# Patient Record
Sex: Female | Born: 1945 | Race: White | Hispanic: No | Marital: Married | Smoking: Former smoker
Health system: Southern US, Community
[De-identification: ages and names within clinical notes are randomized; demographics above are authoritative.]

## PROBLEM LIST (undated history)

## (undated) DIAGNOSIS — M810 Age-related osteoporosis without current pathological fracture: Secondary | ICD-10-CM

## (undated) DIAGNOSIS — K317 Polyp of stomach and duodenum: Secondary | ICD-10-CM

## (undated) DIAGNOSIS — I499 Cardiac arrhythmia, unspecified: Secondary | ICD-10-CM

## (undated) DIAGNOSIS — I10 Essential (primary) hypertension: Secondary | ICD-10-CM

## (undated) DIAGNOSIS — G8929 Other chronic pain: Secondary | ICD-10-CM

## (undated) DIAGNOSIS — T7840XA Allergy, unspecified, initial encounter: Secondary | ICD-10-CM

## (undated) DIAGNOSIS — F419 Anxiety disorder, unspecified: Secondary | ICD-10-CM

## (undated) DIAGNOSIS — E039 Hypothyroidism, unspecified: Secondary | ICD-10-CM

## (undated) DIAGNOSIS — H269 Unspecified cataract: Secondary | ICD-10-CM

## (undated) DIAGNOSIS — I700 Atherosclerosis of aorta: Secondary | ICD-10-CM

## (undated) DIAGNOSIS — N189 Chronic kidney disease, unspecified: Secondary | ICD-10-CM

## (undated) DIAGNOSIS — H547 Unspecified visual loss: Secondary | ICD-10-CM

## (undated) DIAGNOSIS — F32A Depression, unspecified: Secondary | ICD-10-CM

## (undated) DIAGNOSIS — Z9289 Personal history of other medical treatment: Secondary | ICD-10-CM

## (undated) DIAGNOSIS — I4819 Other persistent atrial fibrillation: Secondary | ICD-10-CM

## (undated) DIAGNOSIS — K222 Esophageal obstruction: Secondary | ICD-10-CM

## (undated) DIAGNOSIS — G43909 Migraine, unspecified, not intractable, without status migrainosus: Secondary | ICD-10-CM

## (undated) DIAGNOSIS — E7800 Pure hypercholesterolemia, unspecified: Secondary | ICD-10-CM

## (undated) DIAGNOSIS — S42009A Fracture of unspecified part of unspecified clavicle, initial encounter for closed fracture: Secondary | ICD-10-CM

## (undated) DIAGNOSIS — I69398 Other sequelae of cerebral infarction: Principal | ICD-10-CM

## (undated) DIAGNOSIS — K219 Gastro-esophageal reflux disease without esophagitis: Secondary | ICD-10-CM

## (undated) DIAGNOSIS — J449 Chronic obstructive pulmonary disease, unspecified: Secondary | ICD-10-CM

## (undated) DIAGNOSIS — E559 Vitamin D deficiency, unspecified: Secondary | ICD-10-CM

## (undated) DIAGNOSIS — F329 Major depressive disorder, single episode, unspecified: Secondary | ICD-10-CM

## (undated) DIAGNOSIS — Q453 Other congenital malformations of pancreas and pancreatic duct: Secondary | ICD-10-CM

## (undated) DIAGNOSIS — D1803 Hemangioma of intra-abdominal structures: Secondary | ICD-10-CM

## (undated) DIAGNOSIS — N289 Disorder of kidney and ureter, unspecified: Secondary | ICD-10-CM

## (undated) DIAGNOSIS — M1990 Unspecified osteoarthritis, unspecified site: Secondary | ICD-10-CM

## (undated) DIAGNOSIS — K589 Irritable bowel syndrome without diarrhea: Secondary | ICD-10-CM

## (undated) DIAGNOSIS — I639 Cerebral infarction, unspecified: Secondary | ICD-10-CM

## (undated) DIAGNOSIS — F22 Delusional disorders: Secondary | ICD-10-CM

## (undated) DIAGNOSIS — M545 Low back pain, unspecified: Secondary | ICD-10-CM

## (undated) DIAGNOSIS — K449 Diaphragmatic hernia without obstruction or gangrene: Secondary | ICD-10-CM

## (undated) HISTORY — PX: UPPER GASTROINTESTINAL ENDOSCOPY: SHX188

## (undated) HISTORY — DX: Anxiety disorder, unspecified: F41.9

## (undated) HISTORY — DX: Irritable bowel syndrome, unspecified: K58.9

## (undated) HISTORY — DX: Cerebral infarction, unspecified: I63.9

## (undated) HISTORY — PX: FOOT FRACTURE SURGERY: SHX645

## (undated) HISTORY — DX: Unspecified visual loss: H54.7

## (undated) HISTORY — DX: Polyp of stomach and duodenum: K31.7

## (undated) HISTORY — DX: Esophageal obstruction: K22.2

## (undated) HISTORY — DX: Pure hypercholesterolemia, unspecified: E78.00

## (undated) HISTORY — DX: Essential (primary) hypertension: I10

## (undated) HISTORY — DX: Other congenital malformations of pancreas and pancreatic duct: Q45.3

## (undated) HISTORY — DX: Unspecified cataract: H26.9

## (undated) HISTORY — DX: Gastro-esophageal reflux disease without esophagitis: K21.9

## (undated) HISTORY — DX: Diaphragmatic hernia without obstruction or gangrene: K44.9

## (undated) HISTORY — DX: Unspecified osteoarthritis, unspecified site: M19.90

## (undated) HISTORY — DX: Fracture of unspecified part of unspecified clavicle, initial encounter for closed fracture: S42.009A

## (undated) HISTORY — PX: ANKLE FRACTURE SURGERY: SHX122

## (undated) HISTORY — PX: EYE SURGERY: SHX253

## (undated) HISTORY — DX: Other sequelae of cerebral infarction: I69.398

## (undated) HISTORY — DX: Age-related osteoporosis without current pathological fracture: M81.0

## (undated) HISTORY — DX: Delusional disorders: F22

## (undated) HISTORY — DX: Atherosclerosis of aorta: I70.0

## (undated) HISTORY — PX: COLONOSCOPY: SHX174

## (undated) HISTORY — PX: KNEE ARTHROSCOPY: SHX127

## (undated) HISTORY — DX: Vitamin D deficiency, unspecified: E55.9

## (undated) HISTORY — DX: Allergy, unspecified, initial encounter: T78.40XA

## (undated) HISTORY — PX: CATARACT EXTRACTION W/ INTRAOCULAR LENS  IMPLANT, BILATERAL: SHX1307

## (undated) HISTORY — DX: Hypothyroidism, unspecified: E03.9

## (undated) HISTORY — DX: Hemangioma of intra-abdominal structures: D18.03

## (undated) HISTORY — PX: BACK SURGERY: SHX140

## (undated) HISTORY — PX: FRACTURE SURGERY: SHX138

## (undated) HISTORY — PX: APPENDECTOMY: SHX54

## (undated) HISTORY — PX: DILATION AND CURETTAGE OF UTERUS: SHX78

---

## 1968-08-21 DIAGNOSIS — Z9289 Personal history of other medical treatment: Secondary | ICD-10-CM

## 1968-08-21 HISTORY — PX: NEPHRECTOMY: SHX65

## 1968-08-21 HISTORY — DX: Personal history of other medical treatment: Z92.89

## 1970-08-21 HISTORY — PX: ABDOMINAL HYSTERECTOMY: SHX81

## 1998-09-06 ENCOUNTER — Encounter: Payer: Self-pay | Admitting: Gastroenterology

## 1998-09-06 ENCOUNTER — Ambulatory Visit (HOSPITAL_COMMUNITY): Admission: RE | Admit: 1998-09-06 | Discharge: 1998-09-06 | Payer: Self-pay | Admitting: Gastroenterology

## 1998-09-23 ENCOUNTER — Encounter: Payer: Self-pay | Admitting: Gastroenterology

## 1998-09-23 ENCOUNTER — Ambulatory Visit (HOSPITAL_COMMUNITY): Admission: RE | Admit: 1998-09-23 | Discharge: 1998-09-23 | Payer: Self-pay | Admitting: Gastroenterology

## 1998-11-22 ENCOUNTER — Ambulatory Visit (HOSPITAL_COMMUNITY): Admission: RE | Admit: 1998-11-22 | Discharge: 1998-11-22 | Payer: Self-pay | Admitting: Gastroenterology

## 1998-12-16 ENCOUNTER — Encounter: Admission: RE | Admit: 1998-12-16 | Discharge: 1998-12-23 | Payer: Self-pay | Admitting: Anesthesiology

## 1998-12-23 ENCOUNTER — Encounter: Admission: RE | Admit: 1998-12-23 | Discharge: 1999-03-23 | Payer: Self-pay | Admitting: Anesthesiology

## 2000-11-19 HISTORY — PX: LUMBAR FUSION: SHX111

## 2000-11-22 ENCOUNTER — Encounter: Payer: Self-pay | Admitting: Neurosurgery

## 2000-11-26 ENCOUNTER — Encounter: Payer: Self-pay | Admitting: Neurosurgery

## 2000-11-26 ENCOUNTER — Ambulatory Visit (HOSPITAL_COMMUNITY): Admission: RE | Admit: 2000-11-26 | Discharge: 2000-11-27 | Payer: Self-pay | Admitting: Neurosurgery

## 2000-12-19 ENCOUNTER — Inpatient Hospital Stay (HOSPITAL_COMMUNITY): Admission: AD | Admit: 2000-12-19 | Discharge: 2000-12-22 | Payer: Self-pay | Admitting: Internal Medicine

## 2001-06-23 ENCOUNTER — Emergency Department (HOSPITAL_COMMUNITY): Admission: EM | Admit: 2001-06-23 | Discharge: 2001-06-23 | Payer: Self-pay | Admitting: Emergency Medicine

## 2001-06-23 ENCOUNTER — Encounter: Payer: Self-pay | Admitting: Emergency Medicine

## 2002-01-17 ENCOUNTER — Ambulatory Visit (HOSPITAL_COMMUNITY): Admission: RE | Admit: 2002-01-17 | Discharge: 2002-01-17 | Payer: Self-pay | Admitting: Preventative Medicine

## 2002-10-28 ENCOUNTER — Encounter: Payer: Self-pay | Admitting: Rheumatology

## 2002-10-28 ENCOUNTER — Encounter: Admission: RE | Admit: 2002-10-28 | Discharge: 2002-10-28 | Payer: Self-pay | Admitting: Rheumatology

## 2002-10-28 ENCOUNTER — Encounter: Admission: RE | Admit: 2002-10-28 | Discharge: 2002-10-28 | Payer: Self-pay | Admitting: Internal Medicine

## 2002-10-28 ENCOUNTER — Encounter: Payer: Self-pay | Admitting: Internal Medicine

## 2004-05-19 ENCOUNTER — Ambulatory Visit (HOSPITAL_COMMUNITY): Admission: RE | Admit: 2004-05-19 | Discharge: 2004-05-19 | Payer: Self-pay | Admitting: Specialist

## 2004-07-06 ENCOUNTER — Inpatient Hospital Stay (HOSPITAL_COMMUNITY): Admission: RE | Admit: 2004-07-06 | Discharge: 2004-07-12 | Payer: Self-pay | Admitting: Orthopaedic Surgery

## 2006-01-19 HISTORY — PX: KNEE ARTHROSCOPY: SHX127

## 2006-01-29 ENCOUNTER — Ambulatory Visit (HOSPITAL_BASED_OUTPATIENT_CLINIC_OR_DEPARTMENT_OTHER): Admission: RE | Admit: 2006-01-29 | Discharge: 2006-01-29 | Payer: Self-pay | Admitting: Specialist

## 2010-09-10 ENCOUNTER — Ambulatory Visit (HOSPITAL_COMMUNITY)
Admission: RE | Admit: 2010-09-10 | Discharge: 2010-09-10 | Payer: Self-pay | Source: Home / Self Care | Attending: Orthopedic Surgery | Admitting: Orthopedic Surgery

## 2010-09-11 ENCOUNTER — Encounter: Payer: Self-pay | Admitting: Internal Medicine

## 2010-11-15 ENCOUNTER — Other Ambulatory Visit: Payer: Self-pay | Admitting: Gastroenterology

## 2010-11-15 DIAGNOSIS — R112 Nausea with vomiting, unspecified: Secondary | ICD-10-CM

## 2010-11-16 ENCOUNTER — Ambulatory Visit
Admission: RE | Admit: 2010-11-16 | Discharge: 2010-11-16 | Disposition: A | Discharge status: Home / Self Care | Payer: Medicare Other | Source: Ambulatory Visit | Attending: Gastroenterology | Admitting: Gastroenterology

## 2010-11-16 DIAGNOSIS — R112 Nausea with vomiting, unspecified: Secondary | ICD-10-CM

## 2010-11-24 ENCOUNTER — Other Ambulatory Visit (HOSPITAL_COMMUNITY): Payer: Self-pay | Admitting: Gastroenterology

## 2010-11-24 DIAGNOSIS — R112 Nausea with vomiting, unspecified: Secondary | ICD-10-CM

## 2010-11-30 ENCOUNTER — Encounter (HOSPITAL_COMMUNITY)
Admission: RE | Admit: 2010-11-30 | Discharge: 2010-11-30 | Disposition: A | Discharge status: Home / Self Care | Payer: Medicare Other | Source: Ambulatory Visit | Attending: Gastroenterology | Admitting: Gastroenterology

## 2010-11-30 DIAGNOSIS — R112 Nausea with vomiting, unspecified: Secondary | ICD-10-CM | POA: Insufficient documentation

## 2010-11-30 DIAGNOSIS — R109 Unspecified abdominal pain: Secondary | ICD-10-CM | POA: Insufficient documentation

## 2010-11-30 MED ORDER — TECHNETIUM TC 99M MEBROFENIN IV KIT
5.0000 | PACK | Freq: Once | INTRAVENOUS | Status: AC | PRN
  Administered 2010-11-30: 5 via INTRAVENOUS

## 2010-12-01 ENCOUNTER — Other Ambulatory Visit: Payer: Self-pay | Admitting: Internal Medicine

## 2010-12-01 DIAGNOSIS — Z1231 Encounter for screening mammogram for malignant neoplasm of breast: Secondary | ICD-10-CM

## 2010-12-08 ENCOUNTER — Ambulatory Visit: Payer: Medicare Other

## 2010-12-14 ENCOUNTER — Ambulatory Visit: Payer: Medicare Other

## 2010-12-28 ENCOUNTER — Ambulatory Visit
Admission: RE | Admit: 2010-12-28 | Discharge: 2010-12-28 | Disposition: A | Discharge status: Home / Self Care | Payer: Medicare Other | Source: Ambulatory Visit | Attending: Internal Medicine | Admitting: Internal Medicine

## 2010-12-28 ENCOUNTER — Other Ambulatory Visit: Payer: Self-pay | Admitting: Internal Medicine

## 2010-12-28 ENCOUNTER — Ambulatory Visit (HOSPITAL_COMMUNITY): Payer: Medicare Other

## 2010-12-28 DIAGNOSIS — Z139 Encounter for screening, unspecified: Secondary | ICD-10-CM

## 2011-01-06 NOTE — Consult Note (Signed)
[place]. [facility]  Patient:    [name], [name]                      MRN: [id number] Proc. Date: 12/20/00 Adm. Date:  [id number] Disc. Date: [id number] Attending:  Chilton Greathouse R CC:         Ravi R. Felipa Eth, M.D.   Consultation Report  HISTORY OF PRESENT ILLNESS:  The patient is a 65 year old, right-handed, white female, who is well known to me and whom I first saw in consultation less than a month and one-half ago.  At that time, she was suffering from a left lumbar radiculopathy that was secondary to a left L3-4 foraminal/extraforaminal disk herniation.  She was taken to surgery three and one-half weeks ago and underwent a left L3-4 extraforaminal microdiskectomy.  The patient was admitted yesterday after a narcotic overdose.  It is unclear exactly all the medications that she took, but it appears that she took Tylenol PM, Darvocet, Flexeril, and then put on a Duragesic patch.  She may also have taken Tylenol No. 3, but that is uncertain.  In any event, the patient was admitted to the hospital and has recovered from the excessive sedation that those medications caused.  Neurosurgical consultation was requested by Dr. Felipa Eth regarding evaluation of her wounds and her recovery from her surgery.  Four days ago, the patient developed, without any particular cause, a swelling beneath her incision.  We saw her in the office two days ago and found the incision to be well healed over the swelling beneath the incision.  There was no erythema or drainage.  The patient, at the time of her initial evaluation in March of this year, had been on narcotic medications for over a year and was being treated at that time with a Duragesic patch.  She had been counselled, as had her family, that subsequently to the surgery she needed to wean herself off the medications, and that we could help her tolerate the narcotic withdrawal phenomenon with a Clonidine patch.  Notably,  she was receiving medications both through our office and through Dr. Lanier Clam office.  The patient does not describe any fever, low back pain, wound drainage, etc.  PAST MEDICAL HISTORY, FAMILY HISTORY, SOCIAL HISTORY, REVIEW OF SYSTEMS:  As described in my admission note from November 26, 2000.  PHYSICAL EXAMINATION:  GENERAL:  The patient is a well-developed, well-nourished white female in no acute distress.  VITAL SIGNS:  Temperature 98.3, pulse 64, blood pressure 130/90, respiratory rate 20.  BACK:  Wound shows some swelling without change from her exam on April 30. There is no erythema or drainage.  NEUROLOGIC:  Mental status:  The patient is awake, alert, oriented to her name, place, and year, and is following commands.  Her speech is fluent.  She has good comprehension.  Motor examination shows 5/5 strength in the lower extremities including the iliopsoas, quadriceps, dorsiflexion, extensor hallucis longus, and plantar flexor.  Reflexes are 1+ in the biceps and triceps bilaterally.  The left quadriceps is 1+, but the right quadriceps is 2+.  Gastrocnemius are 1+ bilaterally.  Toes are downgoing bilaterally.  There is no clonus at the ankles nor are there Hoffmanns.  She had no spasticity of her lower extremities on exam.  IMPRESSION: 1. Patient recovering from lumbar surgery with improving radicular symptoms.    At this point, her symptoms are really quite minimal.  She denies any lower    back pain.  There is some swelling of her wound that does not appear to be    infected and most likely represents a seroma and which may well resolve on    its own spontaneously. 2. Narcotic addiction with unsuccessful attempt at cessation.  This condition    has been complicated by patient receiving prescriptions from multiple    physicians. 3. "Tightness" in her extremities.  The nature of this is uncertain.  I do not    believe it is related to a disk disease or disk surgery because her     symptoms preceded her surgery, are bilateral, and involved both the upper    and lower extremities.  The nature may be related to her narcotic    addiction.  On the other hand, it is possible that it could be related to    some neurologic phenomenon and may warrant neurologic evaluation.  RECOMMENDATIONS: 1. I will continue to monitor the patients wound, but no intervention    regarding the wound is necessary at this time. 2. The patient most likely will require inpatient narcotic detoxification.    Certainly, she needs only one physician prescribing medications to reduce    the risk for abuse; therefore, I will defer all prescribing of medications    to Dr. Felipa Eth. 3. The phenomenon of tightness in her extremities may be related to a narcotic    withdrawal, but it could well require neurology evaluation.  Again, this    will be deferred to Dr. Felipa Eth.  At this time, Dr. Criss Alvine is on call for Dr. Felipa Eth, and I will attempt to contact Dr. Felipa Eth tomorrow. DD:  12/20/00 TD:  12/22/00 Job: [id number]

## 2011-01-06 NOTE — H&P (Signed)
[place]  Patient:    NOA, GALVAO                      MRN: [id number] Adm. Date:  [id number] Attending:  Chilton Greathouse R CC:         Hewitt Shorts, M.D.   History and Physical  CHIEF COMPLAINT: Narcotic overdose, transferred from outside hospital.  HISTORY OF PRESENT ILLNESS: This is a 65 year old Caucasian female, who I have followed in my practice for several years, who has a long history of lower extremity radicular symptoms and was found to have a left lumbar radiculopathy with a left L3-4 foraminal and extraforaminal disk herniation.  She has a history of undergoing extensive nonsurgical treatment including a variety of spinal injections and treatment with nonsteroidal anti-inflammatory agents without relief.  She was evaluated by Dr. Shirlean Kelly, who discussed surgical intervention, and she was in agreement.  This was performed on November 26, 2000.  Prior to this surgical intervention the patient has a long history of using narcotic agents for pain relief.  For approximately one year prior she has been using Duragesic patches at a dose of 75 mcg/h.  This was in conjunction with as-needed Darvocet.  After surgery she was encouraged to discontinue the Duragesic patch.  Shortly after surgery she called our office complaining of lower extremity spasms.  Muscle relaxants were called in by Dr. Newell Coral; however, despite that intervention along with hot baths and showers, her pain continued.  Given her long history of using narcotics it was recommended that she gradually taper down on her Duragesic patch.  She was told to use Duragesic patches of 50 mcg/h, changed every three days for approximately two weeks, and then go to 25 mcg/h for the next two weeks, and then discontinue.  In follow-up visits with Dr. Newell Coral he also again encouraged cessation of the Duragesic patch, per the patients report.  She was restarted on Celebrex  along with a Clonidine patch.  Given the patients determination to come off the Duragesic patch, on December 16, 2000 the patient stopped her Duragesic patch at that time, which consisted of 50 mcg/h of fentanyl, and started the Clonidine patch in conjunction with the Celebrex. Yesterday, December 18, 2000, the patient developed progressive lower extremity spasms and cramps.  Over a period of what everyone perceives to be six to eight hours, the patient started taking progressive pain medications to alleviate her suffering.  She first started off taking Flexeril, dose unknown, then took Tylenol #3 x 2 tablets, Tylenol P.M. x 2 tablets, and then finally placed a 25 mcg/h patch of fentanyl on.  The latter took place at approximately 3 a.m. this morning.  The patient was found this morning at 6 a.m. by a niece.  At that time she was unresponsive and unable to walk.  She may have been mumbling.  There were no obvious injuries.  There is no history of seizure disorder.  She was [place] [facility] [place]  On arrival her Duragesic patch was removed.  Discussion with the family revealed that she had not been overly depressed or talking about wanting to hurt herself.  There was no suicide note.  There is no history of suicide attempt.  There is no marital conflict or other problems noted by family.  The patient did receive Narcan 2 mg IV and within a few minutes was opening her eyes, with pupils changing to  mid point.  She was communicative and recognized her son.  She subsequently did become agitated, however, and was controlled.  After family discussion the patient was transferred to [facility] as a direct admission for further evaluation by myself.  REVIEW OF SYSTEMS: Negative for signs or symptoms of upper respiratory tract infection.  Of note, the patient did see Dr. Newell Coral on December 18, 2000 for progressive swelling around her lumbar  laminectomy wound.  Per family report, it is unclear what caused the swelling; however, he at that time recommended further observation with follow-up visit this coming Monday.  The patient denies any chest pain, shortness of breath, nausea, vomiting, new neurologic deficits, new musculoskeletal deficits with the exception of that referring to her lower extremity radicular pain and cramps.  The patient denies any dysuria, diarrhea, constipation, or blood per vaginal area or rectum.  PAST MEDICAL/SURGICAL HISTORY:  1. Hypertension.  2. Hypothyroidism.  3. History of hormone replacement therapy.  4. History of tobacco abuse.  5. Gastroesophageal reflux disease, with history of peptic ulcer disease.  6. Hyperlipidemia.  7. Seasonal allergic rhinitis.  8. History of depression and anxiety.  9. Status post total abdominal hysterectomy. 10. History of migraine headaches. 11. 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 12. History of degenerative disk disease and degenerative joint disease.  SOCIAL HISTORY: The patient is married for 30+ years and has three children and four grandchildren.  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  Has smoked approximately three-fourths of a pack per day for the last 30 years.  Has occasional alcohol use.  Denies any illegal drug abuse.  FAMILY HISTORY: Father died at the age of 40 of vascular disease.  Mother is 18 and has hypertension and a history of stroke.  The patient has numerous siblings, with family history significant for hypertension.  CURRENT MEDICATIONS (as best I can tell):  1. Prilosec 40 mg q.d.  2. Synthroid 150 mcg q.d.  3. Lescol XL 80 mg q.d.  4. Prozac 20 mg q.d.  5. Hydroxyzine 25 mg q.d. as-needed.  6. Claritin-D 24 Hour as-needed.  7. Altace 5 mg q.d.  8. Premarin q.d.  9. Hydrochlorothiazide 12.5 mg q.d. 10. Celebrex 200 mg q.d. 11. Duragesic patch, prior to surgery 75 mcg/h -  changed every three days. 12. Imitrex nasal spray as-needed. 13. Levsin as-needed.  ALLERGIES: PENICILLIN.   PHYSICAL EXAMINATION:  GENERAL: The patient is a frail Caucasian female, with general malaise, sitting in bed, who is conversant and appropriate.  VITAL SIGNS: The patient is afebrile.  Blood pressure is 140/80, respirations are 18 and unlabored, pulse is 74, with telemetry revealing normal sinus rhythm.  HEENT: Head normocephalic, atraumatic.  EOMI.  Sclerae anicteric.  Pupils reactive bilaterally and equal.  No sinus tenderness.  No oropharyngeal lesions.  NECK: Supple.  No thyromegaly or cervical lymphadenopathy.  LUNGS: Clear to auscultation bilaterally.  CARDIOVASCULAR: Regular rate and rhythm without murmurs, rubs, or gallops appreciated.  No axillary lymphadenopathy.  ABDOMEN: Soft, nontender, nondistended abdomen.  Bowel sounds present throughout.  No hepatosplenomegaly.  GU/RECTAL: Examinations are deferred.  BACK: Nonerythematous tissue swelling noted over recent surgical wound, which is intact, with no drainage.  There is no fluctuance over this soft tissue swelling.  The wound is not warm.  EXTREMITY: No clubbing, cyanosis, or edema.  Pulses intact in all four extremities.  NEUROLOGIC: Cranial nerves 2-12 are grossly intact.  Light touch  is grossly intact throughout.  Muscle strength is grossly intact throughout; however, gait was not tested.  Coordination is somewhat slow but intact with respect to rapid alternating hand movements.  LABORATORY DATA: Laboratory evaluation at [facility] included EKG revealing normal sinus rhythm with no acute changes.  Tox screen is negative.  Glucose 98, BUN 10, creatinine 0.9.  Calcium 9.4, total protein 7.3, albumin 3.8, alkaline phosphatase 62, SGOT 13, total bilirubin 0.5, SGPT 10.  Sodium 138, potassium 3.6, chloride 103, CO2 31. Urinalysis unremarkable.  ABG revealed a pH of 7.4, pCO2 48, pO2  79, bicarbonate 29, oxygen saturation 96% on room air.  Urine pregnancy test negative.  WBC 5.0, hemoglobin 11.8, hematocrit 33.4%; MCV 92; platelet count 322,000.  ASSESSMENT/PLAN: The patient is a 65 year old Caucasian female, who has a long history of narcotic use for her lower extremity radiculopathy secondary to disk herniation.  She has recently undergone surgery with lumbar laminectomy by Dr. Newell Coral.  Her course was uneventful; however, the patient tried to discontinue her Duragesic patch abruptly and suffered from significant lower extremity pain.  It was recommended that she gradually taper her Duragesic patch; however, within the last week; i.e., last Sunday, the patient abruptly stopped her 50 mcg/h Duragesic patch and subsequently over the next two days suffered significant lower extremity pain.  During the last 12 hours prior to admission at an outside ER the patient started using progressive medications to treat her pain including narcotics, muscle relaxers, sleeping agents.  The patient was found unresponsive by a relative, after which she was subsequently transported to the [facility] Emergency Room.  Narcan resulted in significant change in her mental status, with her being awake and alert and responsive to those around her.  Currently she is hemodynamically stable, with laboratory evaluation being unremarkable.  I suspect that her narcotic overdose this morning was secondary to her increased pain and suffering but was not a deliberate attempt to harm herself or those around her.  There is no suicide note.  There is no mention of taking her life or harming herself to any of her family members.  She certainly has no history of suicide attempts; however, she does have a history of anxiety and depression, which has been well controlled in the past on Prozac.  Certainly affective disorders may decrease her pain threshold; however, all are in agreement  including multiple family members, spouse, and children that she is not at risk for trying to harm herself.  Regardless, I have discussed this in detail with the patient and she also reiterates that she has no suicidal ideation and has contracted for safety.  Again, our discussion also revolved around the fact that we probably need to gradually taper her narcotic agents.  Indeed, we will need to be very careful with her use of breakthrough pain medications as well as use of Duragesic patches.  Out plan right now will be to observe her overnight and monitor for any increased pain and/or withdrawal side effects from the narcotic agents.  We will provide her with limited doses of Darvocet on an as-needed basis tonight and reassess her in the morning from a psychiatric point of view.  Our plan in the future may be to provide her with a Duragesic patch at 25 mcg/h and subsequently taper over the next one to two weeks.  She will certainly need follow-up with Dr. Newell Coral given the soft tissue swelling around her lumbar wound; however, at this point there appears to be  no evidence of a soft tissue infection given that there is no erythema, discharge, or marked tenderness.  Her other multiple medical problems including hypertension, hyperlipidemia, and hypothyroidism are clinically stable at this point and we will continue her home regimen for those clinical issues at this time. DD:  12/19/00 TD:  12/20/00 Job: [id number]

## 2011-01-06 NOTE — H&P (Signed)
[name], [name]               ACCOUNT NO.:  [id number]   MEDICAL RECORD NO.:  [id number]          PATIENT TYPE:  INP   LOCATION:                               FACILITY:  [facility]   PHYSICIAN:  Sharolyn Douglas, M.D.        DATE OF BIRTH:  1945-12-06   DATE OF ADMISSION:  07/06/2004  DATE OF DISCHARGE:                                HISTORY & PHYSICAL   CHIEF COMPLAINT:  Pain in my back, left hip, and thigh.   HISTORY OF PRESENT ILLNESS:  This 65 year old white female with persistent  and ongoing pain in her low back with radiation to the left lower extremity  has decided to go ahead with surgical intervention. She has had on and off  back complaints for some time with previous surgery. Now, this discomfort  mentioned above has been going on for well over two months. She has had a  previous laminotomy by Dr. Newell Coral several years ago with only short-term  pain relief. She is a very active married lady who enjoys life, but  unfortunately this is now markedly interfering with her day to day activity.  She has pain and difficult with sitting and any flexion or extension of the  lumbar spine. He has had no bowel or bladder dysfunction. MRI has shown L3-4  degenerative disk disease with severe foraminal stenosis post laminotomy.  Due to these positive findings and the fact that the patient has positive  findings on physical exam with positive straight leg raising, it was felt  that she would benefit with surgical intervention, being admitted for this  during this admission.   PAST MEDICAL HISTORY:  The patient is being treated by Dr. Felipa Eth of [facility]. She is allergic to PENICILLIN.   CURRENT MEDICATIONS:  1.  __________ 12-hour capsules p.r.n.  2.  Accupril 40 mg one q.p.m.  3.  Nexium 40 mg one q.a.m.  4.  Relafen 750 mg one daily (was stopped prior to surgery).  5.  Synthroid 0.125 mg q.a.m.  6.  Fluoxetine 20 mg q.p.m.  7.  Atarax and HydroDIURIL p.r.n.  8.   Premarin 0.625 mg q.a.m.  9.  Lescol 80 mg q.p.m.  10. Plendil 2.5 mg q.p.m.  11. Wellbutrin 150 mg q.a.m.  12. Nasal Imitrex spray for migraines 20 mg p.r.n.  13. Duragesic patch 75 mg as prescribed.  14. Darvocet p.r.n. pain.   Dr. Felipa Eth has treated her hypertension, hiatal hernia, and reflux. Occasional  migraines. The patient only has one kidney.  She is allergic to PENICILLIN.   FAMILY HISTORY:  Positive for heart disease and hypertension.   SOCIAL HISTORY:  The patient is married and disabled. Has no intake of  tobacco products, stopped five years. Has social intake of alcohol products.  Has three children and her husband and children will be caregivers after  surgery. She lives in her own home with three floors and seven stairs.   REVIEW OF SYSTEMS:  CNS: No seizure disorder, paralysis, numbness, or double  vision. The patient does have radiculitis of the L3 nerve  root as mentioned  above. RESPIRATORY:  No shortness of breath, productive cough, or  hemoptysis. CARDIOVASCULAR: No chest pain, angina, or orthopnea.  GASTROINTESTINAL: No nausea, vomiting, melena, or bloody stool.  GENITOURINARY: No discharge, dysuria, or hematuria.   PHYSICAL EXAMINATION:  VITAL SIGNS: Pulse 76, respirations 12, blood  pressure 140/72.  GENERAL: Alert, cooperative, and friendly 65 year old white female  accompanied by her husband.  HEENT:  Normocephalic. PERRLA. Oropharynx is clear. EOMs are intact.  CHEST: Clear to auscultation. No rales, rhonchi, or wheezes.  HEART: Regular rate and rhythm.  No murmurs are heard.  ABDOMEN: Soft, nontender. Liver and spleen not felt.  RECTAL/PELVIC/BREASTS/GENITALIA: Not done; not pertinent to the present  illness.  EXTREMITIES: As in present illness above.   ADMISSION DIAGNOSES:  1.  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  2.  Hypertension  3.  Reflux.  4.  History of migraine headache.   PLAN:  The patient will undergo L3-4 revision lumbar  laminectomy with  posterior spinal fusion with TLIF pedicle screws and allograft, possible  BMP.   This patient is seen in our office for a history and physical and fitting of  EBI lumbosacral orthosis which will be used postoperatively. Her visit today  was on June 30, 2004.       ________________________________________  Druscilla Brownie. Cherlynn June.  ___________________________________________  Sharolyn Douglas, M.D.    DLU/MEDQ  D:  06/30/2004  T:  06/30/2004  Job:  [id number]   cc:   Larina Earthly, M.D.  [address]  [place]  [place] [address]  Fax: [phone]

## 2011-01-06 NOTE — Op Note (Signed)
[name], [name]               ACCOUNT NO.:  [id number]   MEDICAL RECORD NO.:  [id number]          PATIENT TYPE:  INP   LOCATION:  3301                         FACILITY:  [facility]   PHYSICIAN:  [name], M.D.        DATE OF BIRTH:  03-31-1946   DATE OF PROCEDURE:  07/06/2004  DATE OF DISCHARGE:                                 OPERATIVE REPORT   DIAGNOSES:  1.  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  2.  Status post L3-4 left laminotomy.   PROCEDURES:  1.  Revision L3-4 laminectomy with wide decompression of the thecal sac and      nerve roots bilaterally.  2.  Transforaminal lumbar interbody fusion, left L3-4, with placement of 10      mm PEEK cage packed with BMP.  3.  Pedicle screw instrumentation, L3-4, using a [employer] system.  4.  Posterior spinal arthrodesis, L3-4.  5.  Local autogenous bone graft supplemented with BMP.  6.  Neurologic monitoring of four pedicle screws and monitoring of EMG x2      hours.   SURGEON:  [name], M.D.   ASSISTANT:  [name], P.A.   ANESTHESIA:  General endotracheal.   COMPLICATIONS:  None.   INDICATIONS:  The patient is a pleasant 65 year old female with intractable  back and left lower extremity pain thought to be secondary to severe  foraminal stenosis at L3-4 and spinal stenosis.  She is status post previous  hemilaminotomy at L3-4 by Dr. Newell Coral in 2002.  Unfortunately, she had no  relief of her symptoms and, in fact, her pain has progressively worsened.  She now presents for L3-4 decompression and fusion in hopes of improving her  symptoms.  She knows the risks and benefits.   PROCEDURE:  The patient was properly identified in the holding area and  given general endotracheal anesthesia without difficulty, given prophylactic  IV antibiotics.  She was carefully turned prone onto the Wilson frame and  all bony prominences were padded.  The face  and eyes were protected at all  times, back prepped and draped in the usual sterile fashion.  A midline  incision made incorporating the previous scar and extending several inches  proximally.  Dissection was carried proximally through the deep fascia.  The  paraspinal muscles were elevated out of the tips of the transverse processes  of L3 and L4.  The previous laminotomy defect was identified on the left  side.  Curettes were used to dissect the epidural fibrosis from the  laminotomy edges.  Laminectomy completed by removing the entire spinous  process of L3.  The laminectomy was carried out, removing the ligamentum  flavum.  The lateral recess was decompressed bilaterally flush with the  pedicles.  We identified the nerve roots on the right side, L3 and L4, and  confirmed that they were completely decompressed.  On the left side the L3  nerve root was compressed within the foramen.  We noted epidural fibrosis  extending out along the  course of the nerve.  We then turned our attention  to completing the transforaminal lumbar interbody fusion.  The disk was  incised with a 15 blade.  The disk space itself was completely collapsed and  was dilated up to 8 mm using bulleted distractors.  Curettes used to  complete the diskectomy across to the contralateral side.  Cartilaginous end  plates were scraped.  Two BMP sponges from the medium [employer] kit were then  pushed into the disk space.  Local autogenous bone graft from the  laminectomy defect was then tightly packed into the interspace.  An 8 mm  PEEK cage packed with BMP inserted in an oblique fashion into the  interspace.  We monitored free-running EMGs, and there were no deleterious  changes.  We then turned our attention to completing the posterior spinal  arthrodesis at L3 and L4 transverse processes, decorticated with high-speed  bur.  Remaining local bone graft packed into the lateral gutters  bilaterally.  We then turned our attention  to placing pedicle screws at L3  and L4 bilaterally using anatomic probing technique.  We were able to  palpate the pedicles from both in the canal as well as in the pedicle holes  themselves, and there were no breaches.  Each pedicle screw was stimulated  using triggered EMGs, and there were no deleterious changes.  We utilized  5.5 x 40 mm screws in L3 and 6.5 x 40 mm screws in L4.  We had good screw  purchase, and the patient's bone stock was adequate.  We then placed  titanium rods into the polyaxial screw heads.  Gentle compression was  applied on the right side before shearing off the locking caps.  Intraoperative x-rays showed appropriate positioning of the instrumentation  at the L3-4 level.  The wound was irrigated.  Gelfoam was left over the  exposed epidural space.  A deep Hemovac drain placed.  Deep fascia closed  with running #1 Vicryl suture, subcutaneous layer closed with 2-0 Vicryl,  followed by running subcuticular Vicryl suture on the skin edges.  Benzoin  and Steri-Strips placed.  A sterile dressing applied.  The patient was  extubated without difficulty and transferred to recovery in stable  condition.       MC/MEDQ  D:  07/06/2004  T:  07/07/2004  Job:  [id number]

## 2011-01-06 NOTE — Consult Note (Signed)
[place]. [facility]  Patient:    [name], [name]                      MRN: [id number] Proc. Date: 12/21/00 Adm. Date:  [id number] Disc. Date: [id number] Attending:  Chilton Greathouse R CC:         Ravi R. Felipa Eth, M.D.  Hewitt Shorts, M.D.  Alleen Borne, M.D.   Consultation Report  REASON FOR CONSULTATION:  Arrhythmia with wide complex tachycardia.  Thank you for asking me to see this 65 year old female for evaluation of a wide complex tachycardia of brief duration.  The patient has a prior history of cigarette abuse and hypertension.  She had significant radiculopathy and recently had back surgery by Dr. Shirlean Kelly on November 26, 2000.  She had previously been on Duragesic patch for relief, but was taken off of this fairly quickly and was admitted with a narcotics overdose which occurred in the setting of trying to get adequate pain control after being withdrawn from her other narcotics.  She has made a good recovery from this and has been feeling better over the past several days.  She had an episode of a wide complex irregular tachycardia this morning of 12 beats with an average rate of around 120.  This appeared to have been initiated by slightly premature atrial beat.  She was really not symptomatic during this time and we were asked to see her.  She has really only been limited because of back pain with lying down or sitting.  Is able to do normal activities which involves her housework and vigorous work including activity and exercise without cardiac symptoms. She specifically denied angina, PND, orthopnea, edema, syncope, or claudication.  She does have a previous history of some episodic palpitations which has been brief.  There is a prior past history of some herbal products used, but none recently.  She quit smoking several weeks prior to her ongoing cardiac surgery.  PAST MEDICAL HISTORY:  Remarkable for hypertension.  There is also a  history of hypothyroidism, history of gastroesophageal reflux disease, peptic ulcer disease.  She has a known history if hyperlipidemia and some history of depression/anxiety.  History of migraine headaches in the past.  PAST SURGICAL HISTORY:  Abdominal hysterectomy, recent laminectomy, nephrectomy on the right in 1971 secondary to trauma.  SOCIAL HISTORY:  She has been married for over 30 years.  She is retired from [employer] union.  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 She said that she quit smoking prior to her surgery.  She drinks an occasional wine cooler.  FAMILY HISTORY:  Father died ate 76 of vascular disease; had a heart attack at age 39.  Mother is 37, has hypertension and a history of stroke.  Positive for hypertension.  MEDICATIONS:  1. Prilosec 40 mg q.d.  2. Synthroid 0.15 mg q.d.  3. Lescol XL 80 mg q.d.  4. Prozac 20 mg q.d.  5. Hydroxyzine as needed.  6. Claritin D 24 hours as needed.  7. Altace 5 mg q.d.  8. Premarin q.d.  9. Hydrochlorothiazide 12.5 mg q.d. 10. Celebrex 200 mg q.d. 11. Imitrex nasal spray as needed.  REVIEW OF SYSTEMS:  She has had some episodic gastrointestinal problems in the past and seen Dr. Ewing Schlein, mainly in the form of ulcers and hiatal hernia.  She has had recent cramping noted in her legs.  Otherwise, unremarkable.  PHYSICAL EXAMINATION  GENERAL:  She is a pleasant woman who appears her stated age.  VITAL SIGNS:  Blood pressure currently 135/80, pulse 70.  SKIN:  Warm and dry.  HEENT:  Normal.  NECK:  No thyromegaly, JVD, or carotid bruits.  LUNGS:  Clear to A&P.  CARDIAC:  Normal S1 and S2.  There was no S3, S4 or murmur.  ABDOMEN:  Soft and nontender.  There is no mass or organomegaly.  EXTREMITIES:  Pulses were present; were 2+ without bruits.  LABORATORIES:  A 12 lead electrocardiogram was within normal limits.  An echocardiogram is reviewed and shows normal aortic and mitral valve,  left ventricular function is normal with an EF of 65%.  There is decreased LV compliance with reversal of the E:A ratio.  IMPRESSION: 1. Isolated episode of wide complex tachycardia with virtually no arrhythmia    noted.  This is at a somewhat slow heart rate.  It is very difficult to    tell whether this was atrial fibrillation with a ______ of a brief run of    slow ventricular tachycardia.  In the absence of decreased ventricular    function or other cardiac symptoms, I would favor the addition of low dose    beta blockers, correction of hypokalemia, and avoidance of any agents that    could have a catecholamine effect such as Imitrex or the decongestant    component in Claritin D.  I also would recommend getting a treadmill test    when she has had a chance to adequately recover from her back surgery.  She    did report having a treadmill within the past year that was unremarkable. 2. Hypertension under control. 3. Hyperlipidemia under treatment. 4. Hypothyroidism under treatment.  I would recommend checking a T4 and a TSH    on her also.  I appreciate seeing this nice woman with you. DD:  12/21/00 TD:  12/23/00 Job: [id number]

## 2011-01-06 NOTE — H&P (Signed)
[place]. [facility]  Patient:    [name], [name]                      MRN: [id number] Adm. Date:  [id number] Attending:  Barton Fanny                         History and Physical  HISTORY OF PRESENT ILLNESS:  The patient is a 65 year old right-handed white female who was evaluated for a left lumbar radiculopathy.  Symptoms began two to three years ago on a rather insidious basis and gradually worsened.  She noticed initially that she would have discomfort on long car trips when she was riding in a car.  It would occur in the left buttock and radiate into the lateral aspect of her proximal thigh and the anterior aspect of her distal left thigh and steadily worsened, and she is uncomfortable now sitting at home, in the car even for short distances, lying in bed, and at this point the pain is nearly constant.  She has numbness and tingling in a distribution similar to her pain distribution and had some low back pain as well, but the worst of the pain is in the left buttock radiating to the left thigh.  She has not described any weakness.  She has been treated with Celebrex and Vioxx, neither of which have given her relief.  She underwent a series of spinal injections at [facility] two years ago, which gave her no relief.  She describes bilateral carpal tunnel syndrome that was evaluated two years ago with EMG nerve conduction studies.  It has continued to bother her, and she has developed weakness of her hand.  PAST MEDICAL HISTORY:  Notable for a history of hypertension and hypercholesterolemia, hypothyroidism, peptic ulcer disease, and gastroesophageal reflux disease.  She denies any history of myocardial infarction, cancer, stroke, diabetes, or lung disease.  PAST SURGICAL HISTORY:  Hysterectomy in 1973 and right kidney removal in 1970 because of a traumatic injury.  ALLERGIES:  PENICILLIN, which causes itching and  hives.  MEDICATIONS:  Nexium 40 mg q.d., Lescol XL 80 mg q.d., Wellbutrin SR 150 mg q.d., hydroxyzine 25 mg q.d., hydrochlorothiazide 25 mg q.d., Prozac 20 mg q.d., Altace 10 mg q.d., Celebrex 200 mg q.d., Levoxyl 125 mcg q.d., propoxyphene p.r.n. pain, Duragesic patch p.r.n. for pain.  FAMILY HISTORY:  Her mother is in good health at age 58 with hypertension. Her father died at age 55.  He had a variety of heart disease and vascular disease.  He is status post myocardial infarction and status post CABG x 2.  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  She is quitting smoking.  She drinks alcoholic beverages socially. She denies a history of substance abuse.  REVIEW OF SYSTEMS:  Notable for those difficulties described in the history of present illness and past medical history and is otherwise unremarkable.  PHYSICAL EXAMINATION:  VITAL SIGNS:  Temperature 98.3, pulse 82, blood pressure 98/52, respiratory rate 16.  Height 5 feet 6-1/2 inches.  Weight 132 pounds.  GENERAL:  The patient is a well-developed, well-nourished white female in no acute distress.  CHEST:  Lungs are clear to auscultation.  She has symmetrical respiratory excursion.  CARDIAC:  Heart is a regular rate and rhythm with normal S1, S2.  There is no murmur.  ABDOMEN:  Soft, nondistended.  Bowel sounds are present.  EXTREMITIES:  No clubbing, cyanosis, or edema.  MUSCULOSKELETAL:  Mild tenderness to palpation in the lumbar region.  She is able to flex to 90 degrees, able to extend fairly well but has discomfort with flexion and extension.  Straight leg raising is negative bilaterally.  NEUROLOGIC:  Motor exam:  Notable atrophy of the thenar eminences, right worse than left.  There is significant weakness of her opponens pollicis 4-/5 on the right and 4+/5 on the left.  Strength is 5/5 through the lower extremities, including dorsiflexion, plantar flexion, and extensor  hallucis longus. Sensation is intact to pinprick through the distal lower extremities. Reflexes are 2 at the quadriceps, 1 at the gastrocnemius, and symmetrical. The toes are downgoing bilaterally.  DIAGNOSTIC STUDIES:  MRI shows left L3-4 foraminal and extraforaminal disk herniation.  IMPRESSION:  Left lumbar radiculopathy secondary to a left L3-4 foraminal and extraforaminal disk herniation.  She also has significant bilateral carpal tunnel syndrome with atrophy and weakness of the thenar eminence and opponens pollicis specifically.  PLAN:  The patient will be admitted for a left L3-4 extraforaminal microdiskectomy.  We discussed the nature of surgery, alternatives to surgery, the typical length of surgery, hospital stay, and normal recuperation, limitations during the postoperative period, and risks of surgery, including risks of infection, bleeding, possibility of transfusion, the risk of nerve dysfunction, pain, weakness, numbness, or paresthesias, the risk of recurrent disk herniation, the possible need for further surgery, and anesthetic risks of myocardial infarction, stroke, pneumonia, and death.   Understanding all this, the patient does wish to proceed with surgery, is admitted for such.DD: 11/26/00 TD:  11/26/00 Job: [id number]

## 2011-01-06 NOTE — Discharge Summary (Signed)
[place]. [facility]  Patient:    YANI, COVENTRY                      MRN: [id number] Adm. Date:  [id number] Disc. Date: [id number] Attending:  Chilton Greathouse R CC:         Darden Palmer., M.D.             Hewitt Shorts, M.D.                           Discharge Summary  DISCHARGE DIAGNOSES: 1. Narcotic drug overdose. 2. Status post lumbar laminectomy with radicular pain. 3. Muscle flexor spasms. 4. Hypertension. 5. Hypothyroidism. 6. Wide complex tachycardia, nonsustained. DD:  01/09/01 TD:  01/09/01 Job: [id number]

## 2011-01-06 NOTE — Discharge Summary (Signed)
[name], [name]               ACCOUNT NO.:  [id number]   MEDICAL RECORD NO.:  [id number]          PATIENT TYPE:  INP   LOCATION:  5017                         FACILITY:  [facility]   PHYSICIAN:  Sharolyn Douglas, M.D.        DATE OF BIRTH:  19-Dec-1945   DATE OF ADMISSION:  07/06/2004  DATE OF DISCHARGE:  07/12/2004                                 DISCHARGE SUMMARY   ADMISSION DIAGNOSES:  1.  L3-L4 spondylosis and foraminal stenosis.  2.  Hypertension.  3.  Reflux.  4.  History of migraines.  5.  Anemia.   DISCHARGE DIAGNOSES:  1.  Status post L3-L4 laminectomy and fusion, doing well.  2.  Postoperative anemia that required transfusion.  3.  Postoperative hypokalemia.  4.  Hypertension.  5.  Reflux.  6.  History of migraines.  7.  Anemia.   PROCEDURE:  On 07/06/04, the patient was taken to the operating room for L3-  L4 laminectomy and posterior spinal fusion.  This was done by Sharolyn Douglas,  M.D. assisted by Verlin Fester, P.A.-C.  Anesthesia used was general.   CONSULTS:  None.   LABORATORY DATA:  On 07/04/04, CBC with differential was within normal  limits with the exception of red cells of 3.32, hemoglobin 11.6 and  hematocrit of 32.7.  Hemoglobin and hematocrit were monitored  postoperatively, and on 07/09/04, hemoglobin had reached 8.3, she was  symptomatic, therefore, she was transfused 2 units of packed red cells.  Preoperatively, PT, INR and PTT within normal limits.  Complete metabolic  panel was within normal limits with the exception of ALP of 38.  Basic  metabolic panel on postoperative day 1 showed mild hypokalemia at 3.4,  glucose 118, and calcium 7.6, otherwise normal.  UA from 07/04/04 showed  positive nitrites, moderate leukocyte esterase, 11-20 wbc's and bacteria too  numerous to count.  This was rechecked on 07/09/04 and showed straw colored  urine, small amount of hemoglobin, moderate leukocyte esterase but otherwise  negative.  Blood typing from 07/04/04 and  07/09/04 showed type A positive,  antibody screen negative.   X-ray from 07/06/04, portable spine, was used intraoperatively.  07/12/04  shows posterior fusion at L3-L4 without definite complicating features.  There is no EKG seen on the chart.   BRIEF HISTORY:  The patient is a 66 year old female who had persistent pain  in her back with extension to the left lower extremity.  She has had  increasing pain over several years with failure to improve with conservative  treatment, including physical therapy, activity modification, and  medications, both over-the-counter and prescription medications for anti-  inflammatories as well as escalating doses of the narcotics.  Her activities  of daily living and quality of life suffered significantly secondary to  these findings plus failure to improve with conservative treatment, as well  as her MRI and x-ray findings, and it was felt the best course of management  would be a laminectomy and posterior spinal fusion at L3-L4.  The risks and  benefits of the procedure were discussed with the patient by Dr. Noel Gerold as  well as myself and she indicated an understanding and opted to proceed.   HOSPITAL COURSE:  On 07/06/04, the patient was taken to the operating room  for the above-listed procedures and tolerated the procedures well without  any intraoperative complications.  There was approximately 250 cc of blood  loss and one Hemovac drain was placed.  Postoperatively, routine orthopedic  spine protocol was followed and she progressed along well during the first  postoperative day.  Her hemoglobin was decreased at 9.1, however, she was  asymptomatic, therefore, we opted to continue monitoring.  [employer] was  consulted for an LSO brace that was fitted for her and she wore that when  she was out of bed.  Physical therapy and occupational therapy worked with  her on a daily basis, on brace use, progressive ambulation, as well as  safety.  She progressed  along well with physical therapy.  Her hemoglobin  continued to trend down and by 07/09/04 hemoglobin was 8.3.  She was  symptomatic, therefore, we discussed the risks and benefits of blood  transfusion with her and we opted with her to transfuse 2 units of packed  red blood cells.  She tolerated this without any difficulty.  After the  transfusion, she continued to progress along very well and by 07/12/04 she  had improved significantly, she was independent per physical therapy.  She  had met all orthopedic goals and was medically stable and ready for  discharge.   DISCHARGE PLANS:  The patient is a 65 year old female status post L3-L4  posterior spinal fusion doing well.  Activities include daily ambulation  program, brace on when up, back precautions at all times, not lifting  anything heavier than 5 pounds, daily dressing changes, keep incision dry  until all drainage stops.   DIET:  Regular home diet.   DISCHARGE MEDICATIONS:  1.  Percocet p.r.n. for pain.  2.  Robaxin p.r.n. muscle spasm.  3.  Calcium daily.  4.  Multivitamin daily.  5.  Skelaxin p.r.n.  6.  Colace b.i.d.  7.  Resume home medications.  8.  Ambien p.r.n. sleep.   Avoid anti-inflammatories.   FOLLOWUP:  Two weeks postoperatively with Dr. Noel Gerold, call for an  appointment.   CONDITION ON DISCHARGE:  Stable and improved.   DISPOSITION:  The patient is being discharged to her home with her family's  assistance as well as home health physical therapy and occupational therapy.     CM/MEDQ  D:  09/21/2004  T:  09/21/2004  Job:  [id number]

## 2011-01-06 NOTE — Op Note (Signed)
[name], [name]               ACCOUNT NO.:  [id number]   MEDICAL RECORD NO.:  [id number]          PATIENT TYPE:  AMB   LOCATION:  [facility]                         FACILITY:  [facility]   PHYSICIAN:  Jene Every, M.D.    DATE OF BIRTH:  03/03/1946   DATE OF PROCEDURE:  01/29/2006  DATE OF DISCHARGE:                                 OPERATIVE REPORT   PREOPERATIVE DIAGNOSES:  Medial meniscal tear of the left knee.   POSTOPERATIVE DIAGNOSIS:  1.  Grade 3 chondromalacia of the medial femoral condyle with associated      chondral flap tear.  2.  Medial meniscal tear.  3.  Lateral meniscus tear.  4.  Grade 3 chondromalacia and patella.  5.  Grade 3 chondromalacia, lateral tibial plateau.   PROCEDURE PERFORMED:  1.  Left knee arthroscopy.  2.  Chondroplasty of the medial femoral condyle, medial and lateral tibial      plateau.  3.  Chondroplasty of the patella.  4.  Posterior medial and lateral meniscectomies.   ANESTHESIA:  General.   ASSISTANT:  None.   BRIEF HISTORY/INDICATIONS:  A 65 year old female with refractory knee pain,  MRI indicating possible meniscal pathology.  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  Operative intervention is indicated  for diagnosis and treatment.  The risks and benefits have been discussed,  including bleeding, infection, damage to vascular structures, no change in  symptoms, worsening symptoms, need for repeat debridement in the future,  etc.   TECHNIQUE:  With the patient in supine position after the induction of  adequate general anesthesia and 1 gm Kefzol, the left lower extremity was  prepped and draped in the usual sterile fashion.  A lateral parapatellar  portal and superior medial parapatellar portal was fashioned with a #11  blade.  Ingress cannulae were atraumatically placed.  Irrigant was utilized  to insufflate the joint.  Under direct visualization, a medial parapatellar  portal was fashioned with a #11  blade after localization with an 18 gauge  needle, sparing the medial meniscus.  Examination revealed extensive grade  III changes of the medial femoral condyle with chondral flap tear and loose  cartilaginous debris.  A shaver was introduced, utilized to perform a  chondroplasty of the medial femoral condyle.  There was also tearing of the  posterior horn of the medial meniscus and the anterior horn.  These were  both shaved to a stable base.  Minor grade 2 changes of the tibia.  The  remainder of the meniscus was stable to probe palpation without evidence of  tear.  The lateral meniscus had degenerative fraying throughout.  The shaver  was introduced to utilize and perform lateral meniscectomy with a stable  base.  The chondroplasty of the lateral tibial plateau was performed as  well.  The femoral condyle was essentially unremarkable.  Extensive  patellofemoral degenerative changes as well, and chondroplasty of the  patella and of the sulcus was performed.  Loose cartilaginous debris was  then copiously lavaged.  The gutters were unremarkable.  I re-examined all  compartments, probing the menisci cephalad and  caudad, without residual  pathology amenable to arthroscopic intervention.  Partial tearing of the ACL  was noted.  This was debrided as well.  The PCL was unremarkable.  Next, the  knee was copiously lavaged.  All instrumentation was removed.  The portals  were closed with 4-0 nylon simple suture, and 0.25% Marcaine with  epinephrine was infiltrated into the joint.  The wound was dressed  sterilely.  He was awakened without difficulty and transported to the  recovery room in satisfactory condition.   Patient tolerated the procedure well with no complications.  No assistant.      Jene Every, M.D.  Electronically Signed     JB/MEDQ  D:  01/29/2006  T:  01/29/2006  Job:  [id number]

## 2011-01-06 NOTE — Discharge Summary (Signed)
[place]  Patient:    [name], [name]                      MRN: [id number] Adm. Date:  [id number] Disc. Date: [id number] Attending:  Chilton Greathouse R CC:         Hewitt Shorts, M.D.   Discharge Summary  DISCHARGE DIAGNOSES: 1. Narcotic drug overdose. 2. Status post lumbar laminectomy with radicular pain. 3. Muscle flexor spasms. 4. Hypertension. 5. Hypothyroidism. 6. Wide-complex tachycardia, nonsustained.  HISTORY OF PRESENT ILLNESS:  Please see my history and physical examination dated Dec 19, 2000, for details.  However, briefly, this is a 65 year old Caucasian female who has had a long history of lower extremity radicular symptoms refractory to nonsurgical modalities.  She underwent a lumbar laminectomy by Dr. Shirlean Kelly on November 26, 2000.  Of note, prior to surgery and after surgery she has a long history of using narcotic agents for pain relief including Duragesic patch and as-needed Darvocet.  Instructed shortly after surgery to discontinue the Duragesic patch and use pain medications only for breakthrough pain.  Shortly after surgery she began complaining of lower extremity spasms.  Muscle relaxants were called in by Dr. Newell Coral.  Despite that intervention, along with hot baths and showers, her pain continued.  Unclear whether her muscle spasms were secondary to narcotic withdrawal, and clonidine patch was initiated in conjunction with Celebrex.  On December 18, 2000, the patient again decreased progressive lower extremity spasms and cramps and, over an unknown period of time, the patient began taking her Duragesic patch, benzodiazepines, muscle relaxers, Tylenol No. 3, Tylenol P.M., and was later found unresponsive by family members early in the morning.  At that time she was unresponsive and unable to walk.  She may have been mumbling.  There were no obvious injuries.  There was no history of seizure disorder.  There was no  suicide note left.  Transported to [facility] in [place], nasal cannula.  On arrival, her Duragesic patch was removed.  The family revealed that she had not been overly depressed or did not have any discussions about suicidal ideation or wanting to harm herself. There was no history of suicide attempt, nor was there any marital conflict or other problems noted by the family.  In the ER in [place], [place], she did receive Narcan 2 mg IV and, within a few minutes, the patient began opening her eyes with pupils changes to midpoint.  Communicative and recognized her son.  She subsequently became agitated, subsequently transferred to [facility] as a direct admit for further evaluation by myself.  HOSPITAL COURSE:  Over the next three days, the patients lower extremity spasms and pain slowly resolved.  Extensive discussions were undertaken between myself and the patient, as well as with Dr. Newell Coral, regarding her narcotic use.  Again, it was confirmed that she did not have any suicidal ideation nor was she attempting to harm herself.  She does have a long history of depression and has been on multiple agents including Wellbutrin and Prozac for the same.  She and the family both feel that she is well compensated on these agents with respect to her affective disorder.  With her lower extremity spasms and continued radicular pain, neurological consultation was obtained, and they noted that flexor spasms in both the arms and legs were probably phenomenon of withdrawal from Duragesic patch.  There was no clinical evidence of myelopathy or other  CNS pathology that would cause this.  They doubted metabolic derangement.  They recommended continuing clonidine patch and trying Zanaflex for symptomatic management of these spasms if they recurred.  During hospitalization the patient was also noted to have a 12-beat run of nonsustained slow ventricular tachycardia, during which  she was asymptomatic. Dr. Viann Fish was called in to consult.  He noted that it was difficult to tell whether this was atrial fibrillation with aberrancy or a brief run of slow ventricular tachycardia.  Echocardiogram was within normal limits, with n ejection fraction and no valve abnormalities.  Her compliance was somewhat ______, however, the addition of low-dose beta blocker, correction of mild hypokalemia, and the avoidance of any agents that could have catecholamine effects such as Imitrex or decongestants such as Sudafed.  He also recommended He also recommended getting a treadmill test once the patient has fully recovered from her back surgery.  The patient has had a treadmill test within the last two years which was reportedly negative.  Again, the patient has remained hemodynamically stable throughout her latter hospitalization.  LABORATORY DATA:  White blood cell count 5.6, hemoglobin 11.4, hematocrit 32%, platelet count 295.  Sodium 138, potassium 4.1 after supplementation, chloride 104, serum CO2 27, glucose 97, BUN 7, creatinine 0.9, calcium 9.4.  CKs all within normal limits.  CK-MBs all within normal limits.  Troponin Is all within normal limits.  TSH less than 0.019, with free T4 1.42.  See admission history and physical for laboratory results from outside hospital ER.  Echocardiogram reveals left ventricular size to be normal.  Overall left ventricular systolic function normal.  Ejection fraction 55-65%.  No left ventricular regional wall motion abnormalities.  There is an increased relative contribution of atrial contraction to left ventricular filling.  EKG revealed normal sinus rhythm.  DISCHARGE INSTRUCTIONS:  The patient was to continue usual medications for stomach, thyroid, cholesterol, allergies, and blood pressure.  She was to use a clonidine patch per Dr. Newell Coral.  She was to continue Celebrex 200 mg twice daily.  She was to use Flexeril for muscle  spasms as needed.  She was to use Darvocet as needed, however, under strict control, trying to manage Darvocet  less than four tablets per day.  She was instructed that if her pain is unbearable to start a Duragesic patch at 25 mcg/hr, change every three days, but not to use this in conjunction with Darvocet or Flexeril.  She was to admitted Toprol XL 25 mg p.o. q.d. and add potassium chloride 10 mEq p.o. q.d. She was also instructed that if her spirits are poor or if pain is severe, she is to call our office immediately.  Maintain a low fat diet.  Per Dr. Newell Coral, she was specifically instructed not to combine Xanax, Duragesic patch, Darvocet, and Flexeril at the same time.  She was instructed to go to the emergency room if severe pain persisted.  She was to see Dr. Newell Coral as scheduled on Dec 24, 2000, and call my nurse for an appointment within the next week.DD:  03/27/01 TD:  03/29/01 Job: [id number]

## 2011-01-06 NOTE — Op Note (Signed)
[place]  Patient:    [name], HEUBERGER                      MRN: [id number] Proc. Date: 11/26/00 Adm. 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  OPERATION PERFORMED:  Left L3-4 extraforaminal microdiskectomy.  SURGEON:  Hewitt Shorts, M.D.  ASSISTANT:  Tanya Nones. Jeral Fruit, M.D.  ANESTHESIA:  General endotracheal.  INDICATIONS FOR PROCEDURE:  The patient is a 65 year old woman who presented with left lumbar radiculopathy and was found by MRI scan to have a left L3-4 extraforaminal disk herniation and decision was made to proceed with extraforaminal microdiskectomy.  DESCRIPTION OF PROCEDURE:  The patient was brought to the operating room and placed under general endotracheal anesthesia.  The patient was turned to a prone position and the lumbar region was prepped with Betadine soap and solution and draped in sterile fashion.  The midline was infiltrated with local anesthetic with epinephrine and a localizing x-ray was taken and the L3-4 level identified.  A midline incision made and carried down through the subcutaneous tissues.  Bipolar cautery and electrocautery were used to maintain hemostasis.  dissection was carried down to the lumbar fascia which was incised to the left side of midline and the paraspinal muscles were dissected from the spinous process and lamina in subperiosteal fashion. Dissection was carried out laterally over the facet joint and the transverse process of L3 and L4 were identified.  Another x-ray was taken to confirm the localization and then the microscope was draped and brought into the field to provide additional magnification, illumination and visualization.  The remainder of the procedure was performed using  microdissection and microsurgical technique.  The intertransverse ligament was identified, a lateral facetectomy was performed using the [facility] Max drill and we then were able to open the intertransverse ligament identifying the left L3 nerve root.  Inferomedial to that the lateral aspect of the L3-4 annulus was identified.  The disk herniation itself was subligamentous.  The annulus was incised and a thorough diskectomy was performed of all loose fragments of disk material.  There was moderate spondylitic overgrowth that was removed with an osteophyte removal tool and good  decompression of the left L3 nerve root within the extraforaminal space was achieved.  All loose fragments of disk material were removed.  Hemostasis was established with the use of bipolar cautery and Gelfoam soaked in thrombin.  All the Gelfoam was removed prior to closure. Once the diskectomy was completed and hemostasis established, we instilled 2 cc of fentanyl and 80 mg of Depo-Medrol into the extraforaminal space and then we closed the deep fascia with interrupted undyed 0 Vicryl sutures and the subcutaneous and subcuticular layer were closed with interrupted inverted 2-0 undyed Vicryl sutures and the skin edges were approximated with Dermabond. The patient tolerated the procedure well.  Estimated blood loss for this procedure was less than 50 cc.  Sponge, needle and instrument counts were correct. DD:  11/26/00 TD:  11/26/00 Job: [id number]

## 2011-04-21 ENCOUNTER — Other Ambulatory Visit (HOSPITAL_COMMUNITY): Payer: Self-pay | Admitting: Gastroenterology

## 2011-04-21 DIAGNOSIS — R634 Abnormal weight loss: Secondary | ICD-10-CM

## 2011-04-21 DIAGNOSIS — R197 Diarrhea, unspecified: Secondary | ICD-10-CM

## 2011-04-26 ENCOUNTER — Other Ambulatory Visit (HOSPITAL_COMMUNITY): Payer: Medicare Other

## 2011-05-02 ENCOUNTER — Ambulatory Visit (HOSPITAL_COMMUNITY)
Admission: RE | Admit: 2011-05-02 | Discharge: 2011-05-02 | Disposition: A | Discharge status: Home / Self Care | Payer: Medicare Other | Source: Ambulatory Visit | Attending: Gastroenterology | Admitting: Gastroenterology

## 2011-05-02 DIAGNOSIS — R634 Abnormal weight loss: Secondary | ICD-10-CM | POA: Insufficient documentation

## 2011-05-02 DIAGNOSIS — R110 Nausea: Secondary | ICD-10-CM | POA: Insufficient documentation

## 2011-05-02 DIAGNOSIS — K7689 Other specified diseases of liver: Secondary | ICD-10-CM | POA: Insufficient documentation

## 2011-05-02 DIAGNOSIS — D1809 Hemangioma of other sites: Secondary | ICD-10-CM | POA: Insufficient documentation

## 2011-05-02 DIAGNOSIS — R197 Diarrhea, unspecified: Secondary | ICD-10-CM

## 2011-05-02 DIAGNOSIS — K838 Other specified diseases of biliary tract: Secondary | ICD-10-CM | POA: Insufficient documentation

## 2011-05-02 DIAGNOSIS — R109 Unspecified abdominal pain: Secondary | ICD-10-CM | POA: Insufficient documentation

## 2011-05-02 MED ORDER — IOHEXOL 300 MG/ML  SOLN
80.0000 mL | Freq: Once | INTRAMUSCULAR | Status: AC | PRN
  Administered 2011-05-02: 80 mL via INTRAVENOUS

## 2011-08-23 DIAGNOSIS — IMO0002 Reserved for concepts with insufficient information to code with codable children: Secondary | ICD-10-CM | POA: Diagnosis not present

## 2011-08-23 DIAGNOSIS — H2510 Age-related nuclear cataract, unspecified eye: Secondary | ICD-10-CM | POA: Diagnosis not present

## 2011-12-15 DIAGNOSIS — M949 Disorder of cartilage, unspecified: Secondary | ICD-10-CM | POA: Diagnosis not present

## 2011-12-15 DIAGNOSIS — I10 Essential (primary) hypertension: Secondary | ICD-10-CM | POA: Diagnosis not present

## 2011-12-15 DIAGNOSIS — E039 Hypothyroidism, unspecified: Secondary | ICD-10-CM | POA: Diagnosis not present

## 2011-12-15 DIAGNOSIS — E785 Hyperlipidemia, unspecified: Secondary | ICD-10-CM | POA: Diagnosis not present

## 2011-12-15 DIAGNOSIS — M899 Disorder of bone, unspecified: Secondary | ICD-10-CM | POA: Diagnosis not present

## 2011-12-21 DIAGNOSIS — J301 Allergic rhinitis due to pollen: Secondary | ICD-10-CM | POA: Diagnosis not present

## 2011-12-21 DIAGNOSIS — Z0000 Encounter for general adult medical examination without abnormal findings: Secondary | ICD-10-CM | POA: Diagnosis not present

## 2011-12-21 DIAGNOSIS — M949 Disorder of cartilage, unspecified: Secondary | ICD-10-CM | POA: Diagnosis not present

## 2011-12-21 DIAGNOSIS — M899 Disorder of bone, unspecified: Secondary | ICD-10-CM | POA: Diagnosis not present

## 2011-12-21 DIAGNOSIS — IMO0002 Reserved for concepts with insufficient information to code with codable children: Secondary | ICD-10-CM | POA: Diagnosis not present

## 2012-02-19 DIAGNOSIS — S0003XA Contusion of scalp, initial encounter: Secondary | ICD-10-CM | POA: Diagnosis not present

## 2012-02-19 DIAGNOSIS — IMO0002 Reserved for concepts with insufficient information to code with codable children: Secondary | ICD-10-CM | POA: Diagnosis not present

## 2012-02-19 DIAGNOSIS — E039 Hypothyroidism, unspecified: Secondary | ICD-10-CM | POA: Diagnosis not present

## 2012-02-19 DIAGNOSIS — S0083XA Contusion of other part of head, initial encounter: Secondary | ICD-10-CM | POA: Diagnosis not present

## 2012-02-19 DIAGNOSIS — S1093XA Contusion of unspecified part of neck, initial encounter: Secondary | ICD-10-CM | POA: Diagnosis not present

## 2012-04-18 DIAGNOSIS — IMO0002 Reserved for concepts with insufficient information to code with codable children: Secondary | ICD-10-CM | POA: Diagnosis not present

## 2012-04-18 DIAGNOSIS — B3781 Candidal esophagitis: Secondary | ICD-10-CM | POA: Diagnosis not present

## 2012-04-18 DIAGNOSIS — K279 Peptic ulcer, site unspecified, unspecified as acute or chronic, without hemorrhage or perforation: Secondary | ICD-10-CM | POA: Diagnosis not present

## 2012-07-08 DIAGNOSIS — M1990 Unspecified osteoarthritis, unspecified site: Secondary | ICD-10-CM | POA: Diagnosis not present

## 2012-07-08 DIAGNOSIS — Z23 Encounter for immunization: Secondary | ICD-10-CM | POA: Diagnosis not present

## 2012-07-08 DIAGNOSIS — I10 Essential (primary) hypertension: Secondary | ICD-10-CM | POA: Diagnosis not present

## 2012-07-08 DIAGNOSIS — IMO0002 Reserved for concepts with insufficient information to code with codable children: Secondary | ICD-10-CM | POA: Diagnosis not present

## 2012-12-16 DIAGNOSIS — E785 Hyperlipidemia, unspecified: Secondary | ICD-10-CM | POA: Diagnosis not present

## 2012-12-16 DIAGNOSIS — M899 Disorder of bone, unspecified: Secondary | ICD-10-CM | POA: Diagnosis not present

## 2012-12-16 DIAGNOSIS — E039 Hypothyroidism, unspecified: Secondary | ICD-10-CM | POA: Diagnosis not present

## 2012-12-16 DIAGNOSIS — M949 Disorder of cartilage, unspecified: Secondary | ICD-10-CM | POA: Diagnosis not present

## 2012-12-16 DIAGNOSIS — I10 Essential (primary) hypertension: Secondary | ICD-10-CM | POA: Diagnosis not present

## 2012-12-23 ENCOUNTER — Other Ambulatory Visit: Payer: Self-pay | Admitting: Internal Medicine

## 2012-12-23 DIAGNOSIS — G43909 Migraine, unspecified, not intractable, without status migrainosus: Secondary | ICD-10-CM | POA: Diagnosis not present

## 2012-12-23 DIAGNOSIS — M899 Disorder of bone, unspecified: Secondary | ICD-10-CM | POA: Diagnosis not present

## 2012-12-23 DIAGNOSIS — F341 Dysthymic disorder: Secondary | ICD-10-CM | POA: Diagnosis not present

## 2012-12-23 DIAGNOSIS — M949 Disorder of cartilage, unspecified: Secondary | ICD-10-CM | POA: Diagnosis not present

## 2012-12-23 DIAGNOSIS — K279 Peptic ulcer, site unspecified, unspecified as acute or chronic, without hemorrhage or perforation: Secondary | ICD-10-CM | POA: Diagnosis not present

## 2012-12-23 DIAGNOSIS — Z1231 Encounter for screening mammogram for malignant neoplasm of breast: Secondary | ICD-10-CM

## 2012-12-23 DIAGNOSIS — E039 Hypothyroidism, unspecified: Secondary | ICD-10-CM | POA: Diagnosis not present

## 2012-12-23 DIAGNOSIS — Z0000 Encounter for general adult medical examination without abnormal findings: Secondary | ICD-10-CM | POA: Diagnosis not present

## 2012-12-23 DIAGNOSIS — I10 Essential (primary) hypertension: Secondary | ICD-10-CM | POA: Diagnosis not present

## 2012-12-23 DIAGNOSIS — E785 Hyperlipidemia, unspecified: Secondary | ICD-10-CM | POA: Diagnosis not present

## 2012-12-26 DIAGNOSIS — D2370 Other benign neoplasm of skin of unspecified lower limb, including hip: Secondary | ICD-10-CM | POA: Diagnosis not present

## 2012-12-26 DIAGNOSIS — D1739 Benign lipomatous neoplasm of skin and subcutaneous tissue of other sites: Secondary | ICD-10-CM | POA: Diagnosis not present

## 2012-12-26 DIAGNOSIS — L821 Other seborrheic keratosis: Secondary | ICD-10-CM | POA: Diagnosis not present

## 2012-12-31 DIAGNOSIS — M949 Disorder of cartilage, unspecified: Secondary | ICD-10-CM | POA: Diagnosis not present

## 2012-12-31 DIAGNOSIS — M899 Disorder of bone, unspecified: Secondary | ICD-10-CM | POA: Diagnosis not present

## 2013-01-21 ENCOUNTER — Other Ambulatory Visit: Payer: Self-pay | Admitting: Gastroenterology

## 2013-01-21 ENCOUNTER — Ambulatory Visit
Admission: RE | Admit: 2013-01-21 | Discharge: 2013-01-21 | Disposition: A | Discharge status: Home / Self Care | Payer: Medicare Other | Source: Ambulatory Visit | Attending: Internal Medicine | Admitting: Internal Medicine

## 2013-01-21 DIAGNOSIS — R634 Abnormal weight loss: Secondary | ICD-10-CM | POA: Diagnosis not present

## 2013-01-21 DIAGNOSIS — R112 Nausea with vomiting, unspecified: Secondary | ICD-10-CM | POA: Diagnosis not present

## 2013-01-21 DIAGNOSIS — Z1231 Encounter for screening mammogram for malignant neoplasm of breast: Secondary | ICD-10-CM

## 2013-01-21 DIAGNOSIS — R110 Nausea: Secondary | ICD-10-CM

## 2013-01-21 DIAGNOSIS — R197 Diarrhea, unspecified: Secondary | ICD-10-CM

## 2013-01-23 DIAGNOSIS — R112 Nausea with vomiting, unspecified: Secondary | ICD-10-CM | POA: Diagnosis not present

## 2013-01-23 DIAGNOSIS — R197 Diarrhea, unspecified: Secondary | ICD-10-CM | POA: Diagnosis not present

## 2013-01-23 DIAGNOSIS — R634 Abnormal weight loss: Secondary | ICD-10-CM | POA: Diagnosis not present

## 2013-01-28 ENCOUNTER — Other Ambulatory Visit: Payer: Medicare Other

## 2013-01-28 DIAGNOSIS — R112 Nausea with vomiting, unspecified: Secondary | ICD-10-CM | POA: Diagnosis not present

## 2013-01-31 ENCOUNTER — Other Ambulatory Visit: Payer: Medicare Other

## 2013-02-10 ENCOUNTER — Other Ambulatory Visit: Payer: Medicare Other

## 2013-02-14 ENCOUNTER — Ambulatory Visit
Admission: RE | Admit: 2013-02-14 | Discharge: 2013-02-14 | Disposition: A | Discharge status: Home / Self Care | Payer: Medicare Other | Source: Ambulatory Visit | Attending: Gastroenterology | Admitting: Gastroenterology

## 2013-02-14 DIAGNOSIS — D1803 Hemangioma of intra-abdominal structures: Secondary | ICD-10-CM | POA: Diagnosis not present

## 2013-02-14 DIAGNOSIS — R197 Diarrhea, unspecified: Secondary | ICD-10-CM

## 2013-02-14 DIAGNOSIS — R110 Nausea: Secondary | ICD-10-CM

## 2013-02-14 DIAGNOSIS — R634 Abnormal weight loss: Secondary | ICD-10-CM

## 2013-02-24 DIAGNOSIS — R197 Diarrhea, unspecified: Secondary | ICD-10-CM | POA: Diagnosis not present

## 2013-02-24 DIAGNOSIS — R110 Nausea: Secondary | ICD-10-CM | POA: Diagnosis not present

## 2013-02-24 DIAGNOSIS — R1310 Dysphagia, unspecified: Secondary | ICD-10-CM | POA: Diagnosis not present

## 2013-02-27 DIAGNOSIS — K2900 Acute gastritis without bleeding: Secondary | ICD-10-CM | POA: Diagnosis not present

## 2013-02-27 DIAGNOSIS — R1310 Dysphagia, unspecified: Secondary | ICD-10-CM | POA: Diagnosis not present

## 2013-02-27 DIAGNOSIS — K319 Disease of stomach and duodenum, unspecified: Secondary | ICD-10-CM | POA: Diagnosis not present

## 2013-02-27 DIAGNOSIS — K2100 Gastro-esophageal reflux disease with esophagitis, without bleeding: Secondary | ICD-10-CM | POA: Diagnosis not present

## 2013-02-27 DIAGNOSIS — K2940 Chronic atrophic gastritis without bleeding: Secondary | ICD-10-CM | POA: Diagnosis not present

## 2013-02-27 DIAGNOSIS — K209 Esophagitis, unspecified without bleeding: Secondary | ICD-10-CM | POA: Diagnosis not present

## 2013-02-27 DIAGNOSIS — D131 Benign neoplasm of stomach: Secondary | ICD-10-CM | POA: Diagnosis not present

## 2013-02-27 DIAGNOSIS — R112 Nausea with vomiting, unspecified: Secondary | ICD-10-CM | POA: Diagnosis not present

## 2013-03-21 DIAGNOSIS — K2100 Gastro-esophageal reflux disease with esophagitis, without bleeding: Secondary | ICD-10-CM | POA: Diagnosis not present

## 2013-04-08 ENCOUNTER — Other Ambulatory Visit: Payer: Self-pay | Admitting: Ophthalmology

## 2013-04-08 DIAGNOSIS — H43819 Vitreous degeneration, unspecified eye: Secondary | ICD-10-CM | POA: Diagnosis not present

## 2013-04-08 DIAGNOSIS — H469 Unspecified optic neuritis: Secondary | ICD-10-CM

## 2013-04-08 DIAGNOSIS — H468 Other optic neuritis: Secondary | ICD-10-CM | POA: Diagnosis not present

## 2013-04-08 DIAGNOSIS — Z961 Presence of intraocular lens: Secondary | ICD-10-CM | POA: Diagnosis not present

## 2013-04-09 DIAGNOSIS — H468 Other optic neuritis: Secondary | ICD-10-CM | POA: Diagnosis not present

## 2013-04-11 DIAGNOSIS — H468 Other optic neuritis: Secondary | ICD-10-CM | POA: Diagnosis not present

## 2013-04-14 ENCOUNTER — Ambulatory Visit
Admission: RE | Admit: 2013-04-14 | Discharge: 2013-04-14 | Disposition: A | Discharge status: Home / Self Care | Payer: Medicare Other | Source: Ambulatory Visit | Attending: Ophthalmology | Admitting: Ophthalmology

## 2013-04-14 DIAGNOSIS — R6889 Other general symptoms and signs: Secondary | ICD-10-CM | POA: Diagnosis not present

## 2013-04-14 DIAGNOSIS — H469 Unspecified optic neuritis: Secondary | ICD-10-CM

## 2013-04-14 DIAGNOSIS — H059 Unspecified disorder of orbit: Secondary | ICD-10-CM | POA: Diagnosis not present

## 2013-04-14 MED ORDER — GADOBENATE DIMEGLUMINE 529 MG/ML IV SOLN
10.0000 mL | Freq: Once | INTRAVENOUS | Status: AC | PRN
  Administered 2013-04-14: 10 mL via INTRAVENOUS

## 2013-04-18 DIAGNOSIS — R634 Abnormal weight loss: Secondary | ICD-10-CM | POA: Diagnosis not present

## 2013-04-18 DIAGNOSIS — H468 Other optic neuritis: Secondary | ICD-10-CM | POA: Diagnosis not present

## 2013-05-05 DIAGNOSIS — R634 Abnormal weight loss: Secondary | ICD-10-CM | POA: Diagnosis not present

## 2013-05-05 DIAGNOSIS — H468 Other optic neuritis: Secondary | ICD-10-CM | POA: Diagnosis not present

## 2013-05-07 DIAGNOSIS — H348392 Tributary (branch) retinal vein occlusion, unspecified eye, stable: Secondary | ICD-10-CM | POA: Diagnosis not present

## 2013-05-21 DIAGNOSIS — L821 Other seborrheic keratosis: Secondary | ICD-10-CM | POA: Diagnosis not present

## 2013-05-21 DIAGNOSIS — D2370 Other benign neoplasm of skin of unspecified lower limb, including hip: Secondary | ICD-10-CM | POA: Diagnosis not present

## 2013-05-21 DIAGNOSIS — L723 Sebaceous cyst: Secondary | ICD-10-CM | POA: Diagnosis not present

## 2013-05-21 DIAGNOSIS — D235 Other benign neoplasm of skin of trunk: Secondary | ICD-10-CM | POA: Diagnosis not present

## 2013-05-27 ENCOUNTER — Telehealth: Payer: Self-pay | Admitting: Cardiology

## 2013-05-27 NOTE — Telephone Encounter (Signed)
Walk In pt Form " [facility]" Dropped off Will Hold Until [facility] In Office 05/27/13/KM

## 2013-05-28 ENCOUNTER — Ambulatory Visit (INDEPENDENT_AMBULATORY_CARE_PROVIDER_SITE_OTHER): Payer: Medicare Other | Admitting: Cardiology

## 2013-05-28 ENCOUNTER — Encounter (INDEPENDENT_AMBULATORY_CARE_PROVIDER_SITE_OTHER): Payer: Medicare Other

## 2013-05-28 ENCOUNTER — Encounter: Payer: Self-pay | Admitting: *Deleted

## 2013-05-28 ENCOUNTER — Encounter: Payer: Self-pay | Admitting: Cardiology

## 2013-05-28 VITALS — BP 139/69 | HR 66 | Ht 66.0 in | Wt 115.0 lb

## 2013-05-28 DIAGNOSIS — H547 Unspecified visual loss: Secondary | ICD-10-CM

## 2013-05-28 DIAGNOSIS — E785 Hyperlipidemia, unspecified: Secondary | ICD-10-CM | POA: Insufficient documentation

## 2013-05-28 DIAGNOSIS — I69998 Other sequelae following unspecified cerebrovascular disease: Secondary | ICD-10-CM

## 2013-05-28 DIAGNOSIS — I10 Essential (primary) hypertension: Secondary | ICD-10-CM

## 2013-05-28 DIAGNOSIS — H539 Unspecified visual disturbance: Secondary | ICD-10-CM | POA: Diagnosis not present

## 2013-05-28 DIAGNOSIS — I4891 Unspecified atrial fibrillation: Secondary | ICD-10-CM

## 2013-05-28 DIAGNOSIS — E7800 Pure hypercholesterolemia, unspecified: Secondary | ICD-10-CM

## 2013-05-28 MED ORDER — ASPIRIN EC 81 MG PO TBEC: 81.0000 mg | DELAYED_RELEASE_TABLET | Freq: Every day | ORAL | Status: DC

## 2013-05-28 NOTE — Patient Instructions (Signed)
Take a baby ASA daily.  We will schedule you for an Echocardiogram and Carotid dopplers.  We will have you wear an event monitor.

## 2013-05-28 NOTE — Progress Notes (Signed)
Patient ID: [name], female   DOB: [birth year], 67 y.o.   MRN: [id number] E-Cardio verite 30 day cardiac event monitor applied to patient. Patient declined [employer] monitor because she did not want to have to make a call to the monitor company if she had a symptom.
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
## 2013-06-03 ENCOUNTER — Encounter (HOSPITAL_COMMUNITY): Payer: Medicare Other

## 2013-06-05 ENCOUNTER — Ambulatory Visit (HOSPITAL_COMMUNITY): Payer: Medicare Other | Attending: Cardiology

## 2013-06-05 DIAGNOSIS — I658 Occlusion and stenosis of other precerebral arteries: Secondary | ICD-10-CM | POA: Diagnosis not present

## 2013-06-05 DIAGNOSIS — H547 Unspecified visual loss: Secondary | ICD-10-CM

## 2013-06-05 DIAGNOSIS — I69998 Other sequelae following unspecified cerebrovascular disease: Secondary | ICD-10-CM

## 2013-06-05 DIAGNOSIS — Z87891 Personal history of nicotine dependence: Secondary | ICD-10-CM | POA: Insufficient documentation

## 2013-06-05 DIAGNOSIS — E7800 Pure hypercholesterolemia, unspecified: Secondary | ICD-10-CM | POA: Diagnosis not present

## 2013-06-05 DIAGNOSIS — H53129 Transient visual loss, unspecified eye: Secondary | ICD-10-CM | POA: Insufficient documentation

## 2013-06-05 DIAGNOSIS — I6529 Occlusion and stenosis of unspecified carotid artery: Secondary | ICD-10-CM | POA: Insufficient documentation

## 2013-06-05 DIAGNOSIS — H539 Unspecified visual disturbance: Secondary | ICD-10-CM

## 2013-06-05 DIAGNOSIS — I10 Essential (primary) hypertension: Secondary | ICD-10-CM

## 2013-06-05 DIAGNOSIS — E785 Hyperlipidemia, unspecified: Secondary | ICD-10-CM | POA: Insufficient documentation

## 2013-06-10 ENCOUNTER — Telehealth: Payer: Self-pay | Admitting: Cardiology

## 2013-06-10 NOTE — Telephone Encounter (Signed)
Follow Up ° °Pt returned call//  °

## 2013-06-10 NOTE — Telephone Encounter (Signed)
Returned call to patient no answer.LMTC. 

## 2013-06-11 NOTE — Telephone Encounter (Signed)
Returned call to patient carotid doppler results given. 

## 2013-06-13 ENCOUNTER — Ambulatory Visit (HOSPITAL_COMMUNITY): Payer: Medicare Other | Attending: Cardiology | Admitting: Radiology

## 2013-06-13 ENCOUNTER — Other Ambulatory Visit: Payer: Self-pay

## 2013-06-13 DIAGNOSIS — E7800 Pure hypercholesterolemia, unspecified: Secondary | ICD-10-CM

## 2013-06-13 DIAGNOSIS — H547 Unspecified visual loss: Secondary | ICD-10-CM

## 2013-06-13 DIAGNOSIS — H539 Unspecified visual disturbance: Secondary | ICD-10-CM | POA: Insufficient documentation

## 2013-06-13 DIAGNOSIS — I69998 Other sequelae following unspecified cerebrovascular disease: Secondary | ICD-10-CM | POA: Insufficient documentation

## 2013-06-13 DIAGNOSIS — H5340 Unspecified visual field defects: Secondary | ICD-10-CM | POA: Insufficient documentation

## 2013-06-13 DIAGNOSIS — I10 Essential (primary) hypertension: Secondary | ICD-10-CM | POA: Diagnosis not present

## 2013-06-13 DIAGNOSIS — I6789 Other cerebrovascular disease: Secondary | ICD-10-CM

## 2013-06-13 DIAGNOSIS — I4891 Unspecified atrial fibrillation: Secondary | ICD-10-CM

## 2013-06-13 NOTE — Progress Notes (Signed)
Echocardiogram performed.  

## 2013-07-01 DIAGNOSIS — E785 Hyperlipidemia, unspecified: Secondary | ICD-10-CM | POA: Diagnosis not present

## 2013-07-01 DIAGNOSIS — IMO0002 Reserved for concepts with insufficient information to code with codable children: Secondary | ICD-10-CM | POA: Diagnosis not present

## 2013-07-01 DIAGNOSIS — R110 Nausea: Secondary | ICD-10-CM | POA: Diagnosis not present

## 2013-07-01 DIAGNOSIS — H539 Unspecified visual disturbance: Secondary | ICD-10-CM | POA: Diagnosis not present

## 2013-07-01 DIAGNOSIS — I69998 Other sequelae following unspecified cerebrovascular disease: Secondary | ICD-10-CM | POA: Diagnosis not present

## 2013-07-01 DIAGNOSIS — E039 Hypothyroidism, unspecified: Secondary | ICD-10-CM | POA: Diagnosis not present

## 2013-07-01 DIAGNOSIS — Z23 Encounter for immunization: Secondary | ICD-10-CM | POA: Diagnosis not present

## 2013-07-01 DIAGNOSIS — I10 Essential (primary) hypertension: Secondary | ICD-10-CM | POA: Diagnosis not present

## 2013-07-01 DIAGNOSIS — Z1331 Encounter for screening for depression: Secondary | ICD-10-CM | POA: Diagnosis not present

## 2013-07-02 DIAGNOSIS — H34239 Retinal artery branch occlusion, unspecified eye: Secondary | ICD-10-CM | POA: Diagnosis not present

## 2013-07-09 ENCOUNTER — Encounter: Payer: Self-pay | Admitting: Cardiology

## 2013-07-09 ENCOUNTER — Encounter (INDEPENDENT_AMBULATORY_CARE_PROVIDER_SITE_OTHER): Payer: Self-pay

## 2013-07-09 ENCOUNTER — Ambulatory Visit (INDEPENDENT_AMBULATORY_CARE_PROVIDER_SITE_OTHER): Payer: Medicare Other | Admitting: Cardiology

## 2013-07-09 VITALS — BP 146/72 | HR 63 | Ht 66.0 in | Wt 118.2 lb

## 2013-07-09 DIAGNOSIS — I10 Essential (primary) hypertension: Secondary | ICD-10-CM

## 2013-07-09 DIAGNOSIS — E7800 Pure hypercholesterolemia, unspecified: Secondary | ICD-10-CM | POA: Diagnosis not present

## 2013-07-09 DIAGNOSIS — I69998 Other sequelae following unspecified cerebrovascular disease: Secondary | ICD-10-CM

## 2013-07-09 DIAGNOSIS — H539 Unspecified visual disturbance: Secondary | ICD-10-CM

## 2013-07-09 DIAGNOSIS — H547 Unspecified visual loss: Secondary | ICD-10-CM
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
## 2013-07-09 NOTE — Patient Instructions (Signed)
Continue your current therapy  I will see you as needed. 

## 2013-07-29 DIAGNOSIS — R110 Nausea: Secondary | ICD-10-CM | POA: Diagnosis not present

## 2013-07-29 DIAGNOSIS — R197 Diarrhea, unspecified: Secondary | ICD-10-CM | POA: Diagnosis not present

## 2013-08-29 DIAGNOSIS — J301 Allergic rhinitis due to pollen: Secondary | ICD-10-CM | POA: Diagnosis not present

## 2013-08-29 DIAGNOSIS — I10 Essential (primary) hypertension: Secondary | ICD-10-CM | POA: Diagnosis not present

## 2013-08-29 DIAGNOSIS — IMO0002 Reserved for concepts with insufficient information to code with codable children: Secondary | ICD-10-CM | POA: Diagnosis not present

## 2013-08-29 DIAGNOSIS — E785 Hyperlipidemia, unspecified: Secondary | ICD-10-CM | POA: Diagnosis not present

## 2013-08-29 DIAGNOSIS — R110 Nausea: Secondary | ICD-10-CM | POA: Diagnosis not present

## 2013-09-09 DIAGNOSIS — R634 Abnormal weight loss: Secondary | ICD-10-CM | POA: Diagnosis not present

## 2013-12-17 DIAGNOSIS — I10 Essential (primary) hypertension: Secondary | ICD-10-CM | POA: Diagnosis not present

## 2013-12-17 DIAGNOSIS — E039 Hypothyroidism, unspecified: Secondary | ICD-10-CM | POA: Diagnosis not present

## 2013-12-17 DIAGNOSIS — M899 Disorder of bone, unspecified: Secondary | ICD-10-CM | POA: Diagnosis not present

## 2013-12-17 DIAGNOSIS — Z0000 Encounter for general adult medical examination without abnormal findings: Secondary | ICD-10-CM | POA: Diagnosis not present

## 2013-12-17 DIAGNOSIS — E785 Hyperlipidemia, unspecified: Secondary | ICD-10-CM | POA: Diagnosis not present

## 2013-12-25 DIAGNOSIS — K219 Gastro-esophageal reflux disease without esophagitis: Secondary | ICD-10-CM | POA: Diagnosis not present

## 2013-12-25 DIAGNOSIS — E785 Hyperlipidemia, unspecified: Secondary | ICD-10-CM | POA: Diagnosis not present

## 2013-12-25 DIAGNOSIS — I10 Essential (primary) hypertension: Secondary | ICD-10-CM | POA: Diagnosis not present

## 2013-12-25 DIAGNOSIS — Z23 Encounter for immunization: Secondary | ICD-10-CM | POA: Diagnosis not present

## 2013-12-25 DIAGNOSIS — E039 Hypothyroidism, unspecified: Secondary | ICD-10-CM | POA: Diagnosis not present

## 2013-12-25 DIAGNOSIS — K279 Peptic ulcer, site unspecified, unspecified as acute or chronic, without hemorrhage or perforation: Secondary | ICD-10-CM | POA: Diagnosis not present

## 2013-12-25 DIAGNOSIS — Z0000 Encounter for general adult medical examination without abnormal findings: Secondary | ICD-10-CM | POA: Diagnosis not present

## 2013-12-25 DIAGNOSIS — R110 Nausea: Secondary | ICD-10-CM | POA: Diagnosis not present

## 2013-12-25 DIAGNOSIS — G43909 Migraine, unspecified, not intractable, without status migrainosus: Secondary | ICD-10-CM | POA: Diagnosis not present

## 2013-12-30 ENCOUNTER — Other Ambulatory Visit: Payer: Self-pay | Admitting: Internal Medicine

## 2013-12-30 DIAGNOSIS — Z1231 Encounter for screening mammogram for malignant neoplasm of breast: Secondary | ICD-10-CM

## 2014-01-22 ENCOUNTER — Ambulatory Visit: Payer: Medicare Other

## 2014-05-26 DIAGNOSIS — R110 Nausea: Secondary | ICD-10-CM | POA: Diagnosis not present

## 2014-05-26 DIAGNOSIS — K279 Peptic ulcer, site unspecified, unspecified as acute or chronic, without hemorrhage or perforation: Secondary | ICD-10-CM | POA: Diagnosis not present

## 2014-05-26 DIAGNOSIS — M5380 Other specified dorsopathies, site unspecified: Secondary | ICD-10-CM | POA: Diagnosis not present

## 2014-05-26 DIAGNOSIS — E785 Hyperlipidemia, unspecified: Secondary | ICD-10-CM | POA: Diagnosis not present

## 2014-05-26 DIAGNOSIS — M1990 Unspecified osteoarthritis, unspecified site: Secondary | ICD-10-CM | POA: Diagnosis not present

## 2014-05-26 DIAGNOSIS — I10 Essential (primary) hypertension: Secondary | ICD-10-CM | POA: Diagnosis not present

## 2014-05-26 DIAGNOSIS — J302 Other seasonal allergic rhinitis: Secondary | ICD-10-CM | POA: Diagnosis not present

## 2014-06-22 DIAGNOSIS — Z23 Encounter for immunization: Secondary | ICD-10-CM | POA: Diagnosis not present

## 2014-06-22 DIAGNOSIS — Z283 Underimmunization status: Secondary | ICD-10-CM | POA: Diagnosis not present

## 2014-09-23 DIAGNOSIS — M5380 Other specified dorsopathies, site unspecified: Secondary | ICD-10-CM | POA: Diagnosis not present

## 2014-09-23 DIAGNOSIS — Z6822 Body mass index (BMI) 22.0-22.9, adult: Secondary | ICD-10-CM | POA: Diagnosis not present

## 2014-09-23 DIAGNOSIS — I10 Essential (primary) hypertension: Secondary | ICD-10-CM | POA: Diagnosis not present

## 2014-09-23 DIAGNOSIS — E039 Hypothyroidism, unspecified: Secondary | ICD-10-CM | POA: Diagnosis not present

## 2014-09-23 DIAGNOSIS — K279 Peptic ulcer, site unspecified, unspecified as acute or chronic, without hemorrhage or perforation: Secondary | ICD-10-CM | POA: Diagnosis not present

## 2014-11-02 DIAGNOSIS — M25511 Pain in right shoulder: Secondary | ICD-10-CM | POA: Diagnosis not present

## 2014-11-02 DIAGNOSIS — Z9889 Other specified postprocedural states: Secondary | ICD-10-CM | POA: Diagnosis not present

## 2014-11-02 DIAGNOSIS — M25561 Pain in right knee: Secondary | ICD-10-CM | POA: Diagnosis not present

## 2014-12-21 DIAGNOSIS — M859 Disorder of bone density and structure, unspecified: Secondary | ICD-10-CM | POA: Diagnosis not present

## 2014-12-21 DIAGNOSIS — E039 Hypothyroidism, unspecified: Secondary | ICD-10-CM | POA: Diagnosis not present

## 2014-12-21 DIAGNOSIS — I10 Essential (primary) hypertension: Secondary | ICD-10-CM | POA: Diagnosis not present

## 2014-12-21 DIAGNOSIS — E785 Hyperlipidemia, unspecified: Secondary | ICD-10-CM | POA: Diagnosis not present

## 2014-12-21 DIAGNOSIS — R8299 Other abnormal findings in urine: Secondary | ICD-10-CM | POA: Diagnosis not present

## 2014-12-21 DIAGNOSIS — N390 Urinary tract infection, site not specified: Secondary | ICD-10-CM | POA: Diagnosis not present

## 2014-12-29 DIAGNOSIS — Z6822 Body mass index (BMI) 22.0-22.9, adult: Secondary | ICD-10-CM | POA: Diagnosis not present

## 2014-12-29 DIAGNOSIS — K219 Gastro-esophageal reflux disease without esophagitis: Secondary | ICD-10-CM | POA: Diagnosis not present

## 2014-12-29 DIAGNOSIS — Z1389 Encounter for screening for other disorder: Secondary | ICD-10-CM | POA: Diagnosis not present

## 2014-12-29 DIAGNOSIS — I10 Essential (primary) hypertension: Secondary | ICD-10-CM | POA: Diagnosis not present

## 2014-12-29 DIAGNOSIS — K279 Peptic ulcer, site unspecified, unspecified as acute or chronic, without hemorrhage or perforation: Secondary | ICD-10-CM | POA: Diagnosis not present

## 2014-12-29 DIAGNOSIS — E785 Hyperlipidemia, unspecified: Secondary | ICD-10-CM | POA: Diagnosis not present

## 2014-12-29 DIAGNOSIS — R0789 Other chest pain: Secondary | ICD-10-CM | POA: Diagnosis not present

## 2014-12-29 DIAGNOSIS — G43909 Migraine, unspecified, not intractable, without status migrainosus: Secondary | ICD-10-CM | POA: Diagnosis not present

## 2014-12-29 DIAGNOSIS — I69998 Other sequelae following unspecified cerebrovascular disease: Secondary | ICD-10-CM | POA: Diagnosis not present

## 2014-12-29 DIAGNOSIS — E039 Hypothyroidism, unspecified: Secondary | ICD-10-CM | POA: Diagnosis not present

## 2014-12-29 DIAGNOSIS — M1990 Unspecified osteoarthritis, unspecified site: Secondary | ICD-10-CM | POA: Diagnosis not present

## 2014-12-29 DIAGNOSIS — Z0000 Encounter for general adult medical examination without abnormal findings: Secondary | ICD-10-CM | POA: Diagnosis not present

## 2015-01-15 DIAGNOSIS — H4720 Unspecified optic atrophy: Secondary | ICD-10-CM | POA: Diagnosis not present

## 2015-01-15 DIAGNOSIS — Z961 Presence of intraocular lens: Secondary | ICD-10-CM | POA: Diagnosis not present

## 2015-01-15 DIAGNOSIS — H5203 Hypermetropia, bilateral: Secondary | ICD-10-CM | POA: Diagnosis not present

## 2015-01-15 DIAGNOSIS — H43813 Vitreous degeneration, bilateral: Secondary | ICD-10-CM | POA: Diagnosis not present

## 2015-02-15 ENCOUNTER — Other Ambulatory Visit: Payer: Self-pay

## 2015-06-23 DIAGNOSIS — M859 Disorder of bone density and structure, unspecified: Secondary | ICD-10-CM | POA: Diagnosis not present

## 2015-06-23 DIAGNOSIS — Z79890 Hormone replacement therapy (postmenopausal): Secondary | ICD-10-CM | POA: Diagnosis not present

## 2015-07-01 DIAGNOSIS — Z23 Encounter for immunization: Secondary | ICD-10-CM | POA: Diagnosis not present

## 2015-07-12 DIAGNOSIS — Z6820 Body mass index (BMI) 20.0-20.9, adult: Secondary | ICD-10-CM | POA: Diagnosis not present

## 2015-07-12 DIAGNOSIS — J069 Acute upper respiratory infection, unspecified: Secondary | ICD-10-CM | POA: Diagnosis not present

## 2015-09-02 DIAGNOSIS — N390 Urinary tract infection, site not specified: Secondary | ICD-10-CM | POA: Diagnosis not present

## 2015-09-09 DIAGNOSIS — J069 Acute upper respiratory infection, unspecified: Secondary | ICD-10-CM | POA: Diagnosis not present

## 2015-11-26 DIAGNOSIS — M1711 Unilateral primary osteoarthritis, right knee: Secondary | ICD-10-CM | POA: Diagnosis not present

## 2015-11-26 DIAGNOSIS — M25561 Pain in right knee: Secondary | ICD-10-CM | POA: Diagnosis not present

## 2015-12-28 DIAGNOSIS — N390 Urinary tract infection, site not specified: Secondary | ICD-10-CM | POA: Diagnosis not present

## 2015-12-28 DIAGNOSIS — R8299 Other abnormal findings in urine: Secondary | ICD-10-CM | POA: Diagnosis not present

## 2015-12-28 DIAGNOSIS — M859 Disorder of bone density and structure, unspecified: Secondary | ICD-10-CM | POA: Diagnosis not present

## 2015-12-28 DIAGNOSIS — E038 Other specified hypothyroidism: Secondary | ICD-10-CM | POA: Diagnosis not present

## 2015-12-28 DIAGNOSIS — E784 Other hyperlipidemia: Secondary | ICD-10-CM | POA: Diagnosis not present

## 2015-12-28 DIAGNOSIS — I10 Essential (primary) hypertension: Secondary | ICD-10-CM | POA: Diagnosis not present

## 2016-01-04 DIAGNOSIS — E038 Other specified hypothyroidism: Secondary | ICD-10-CM | POA: Diagnosis not present

## 2016-01-04 DIAGNOSIS — I10 Essential (primary) hypertension: Secondary | ICD-10-CM | POA: Diagnosis not present

## 2016-01-04 DIAGNOSIS — E785 Hyperlipidemia, unspecified: Secondary | ICD-10-CM | POA: Diagnosis not present

## 2016-01-04 DIAGNOSIS — K279 Peptic ulcer, site unspecified, unspecified as acute or chronic, without hemorrhage or perforation: Secondary | ICD-10-CM | POA: Diagnosis not present

## 2016-01-04 DIAGNOSIS — Z87891 Personal history of nicotine dependence: Secondary | ICD-10-CM | POA: Diagnosis not present

## 2016-01-04 DIAGNOSIS — G43909 Migraine, unspecified, not intractable, without status migrainosus: Secondary | ICD-10-CM | POA: Diagnosis not present

## 2016-01-04 DIAGNOSIS — F418 Other specified anxiety disorders: Secondary | ICD-10-CM | POA: Diagnosis not present

## 2016-01-04 DIAGNOSIS — M1990 Unspecified osteoarthritis, unspecified site: Secondary | ICD-10-CM | POA: Diagnosis not present

## 2016-01-04 DIAGNOSIS — Z0000 Encounter for general adult medical examination without abnormal findings: Secondary | ICD-10-CM | POA: Diagnosis not present

## 2016-01-04 DIAGNOSIS — Z1389 Encounter for screening for other disorder: Secondary | ICD-10-CM | POA: Diagnosis not present

## 2016-01-04 DIAGNOSIS — J302 Other seasonal allergic rhinitis: Secondary | ICD-10-CM | POA: Diagnosis not present

## 2016-01-05 ENCOUNTER — Other Ambulatory Visit: Payer: Self-pay | Admitting: Internal Medicine

## 2016-01-05 DIAGNOSIS — Z1231 Encounter for screening mammogram for malignant neoplasm of breast: Secondary | ICD-10-CM

## 2016-01-19 ENCOUNTER — Ambulatory Visit: Payer: Medicare Other

## 2016-01-27 ENCOUNTER — Ambulatory Visit
Admission: RE | Admit: 2016-01-27 | Discharge: 2016-01-27 | Disposition: A | Discharge status: Home / Self Care | Payer: Medicare Other | Source: Ambulatory Visit | Attending: Internal Medicine | Admitting: Internal Medicine

## 2016-01-27 DIAGNOSIS — Z1231 Encounter for screening mammogram for malignant neoplasm of breast: Secondary | ICD-10-CM | POA: Diagnosis not present

## 2016-03-21 DIAGNOSIS — R300 Dysuria: Secondary | ICD-10-CM | POA: Diagnosis not present

## 2016-04-20 DIAGNOSIS — M1711 Unilateral primary osteoarthritis, right knee: Secondary | ICD-10-CM | POA: Diagnosis not present

## 2016-04-20 DIAGNOSIS — M25561 Pain in right knee: Secondary | ICD-10-CM | POA: Diagnosis not present

## 2016-04-26 ENCOUNTER — Encounter: Payer: Self-pay | Admitting: *Deleted

## 2016-04-26 ENCOUNTER — Other Ambulatory Visit (INDEPENDENT_AMBULATORY_CARE_PROVIDER_SITE_OTHER): Payer: Medicare Other

## 2016-04-26 ENCOUNTER — Ambulatory Visit (INDEPENDENT_AMBULATORY_CARE_PROVIDER_SITE_OTHER): Payer: Medicare Other | Admitting: Internal Medicine

## 2016-04-26 ENCOUNTER — Telehealth: Payer: Self-pay | Admitting: *Deleted

## 2016-04-26 VITALS — BP 148/80 | HR 82 | Ht 66.0 in | Wt 114.1 lb

## 2016-04-26 DIAGNOSIS — R110 Nausea: Secondary | ICD-10-CM | POA: Diagnosis not present

## 2016-04-26 DIAGNOSIS — R634 Abnormal weight loss: Secondary | ICD-10-CM

## 2016-04-26 DIAGNOSIS — R197 Diarrhea, unspecified: Secondary | ICD-10-CM

## 2016-04-26 DIAGNOSIS — K838 Other specified diseases of biliary tract: Secondary | ICD-10-CM

## 2016-04-26 LAB — CBC WITH DIFFERENTIAL/PLATELET
Basophils Absolute: 0 10*3/uL (ref 0.0–0.1)
Basophils Relative: 0.3 % (ref 0.0–3.0)
Eosinophils Absolute: 0 10*3/uL (ref 0.0–0.7)
Eosinophils Relative: 0.2 % (ref 0.0–5.0)
HCT: 43.2 % (ref 36.0–46.0)
Hemoglobin: 14.8 g/dL (ref 12.0–15.0)
Lymphocytes Relative: 14.4 % (ref 12.0–46.0)
Lymphs Abs: 0.9 10*3/uL (ref 0.7–4.0)
MCHC: 34.2 g/dL (ref 30.0–36.0)
MCV: 94.8 fl (ref 78.0–100.0)
Monocytes Absolute: 0.4 10*3/uL (ref 0.1–1.0)
Monocytes Relative: 6.8 % (ref 3.0–12.0)
Neutro Abs: 4.9 10*3/uL (ref 1.4–7.7)
Neutrophils Relative %: 78.3 % — ABNORMAL HIGH (ref 43.0–77.0)
Platelets: 385 10*3/uL (ref 150.0–400.0)
RBC: 4.56 Mil/uL (ref 3.87–5.11)
RDW: 13.2 % (ref 11.5–15.5)
WBC: 6.2 10*3/uL (ref 4.0–10.5)

## 2016-04-26 LAB — COMPREHENSIVE METABOLIC PANEL
ALT: 16 U/L (ref 0–35)
AST: 16 U/L (ref 0–37)
Albumin: 4.5 g/dL (ref 3.5–5.2)
Alkaline Phosphatase: 58 U/L (ref 39–117)
BUN: 14 mg/dL (ref 6–23)
CO2: 29 mEq/L (ref 19–32)
Calcium: 10 mg/dL (ref 8.4–10.5)
Chloride: 96 mEq/L (ref 96–112)
Creatinine, Ser: 0.97 mg/dL (ref 0.40–1.20)
GFR: 60.18 mL/min (ref >60.00)
Glucose, Bld: 113 mg/dL — ABNORMAL HIGH (ref 70–99)
Potassium: 4.2 mEq/L (ref 3.5–5.1)
Sodium: 133 mEq/L — ABNORMAL LOW (ref 135–145)
Total Bilirubin: 1 mg/dL (ref 0.2–1.2)
Total Protein: 7.3 g/dL (ref 6.0–8.3)

## 2016-04-26 LAB — IGA: IgA: 199 mg/dL (ref 68–378)

## 2016-04-26 LAB — LIPASE: Lipase: 24 U/L (ref 11.0–59.0)

## 2016-04-26 MED ORDER — ONDANSETRON 4 MG PO TBDP
ORAL_TABLET | ORAL | 2 refills | Status: DC
Start: 2016-04-26 — End: 2016-06-09

## 2016-04-26 MED ORDER — HYOSCYAMINE SULFATE 0.125 MG SL SUBL: SUBLINGUAL_TABLET | SUBLINGUAL | 3 refills | Status: DC
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
## 2016-04-26 NOTE — Patient Instructions (Signed)
Your physician has requested that you go to the basement for the following lab work before leaving today: CBC, CMP, Lipase, IgA, TtG, GI pathogen panel, fecal elastase, calprotectin  We have sent the following medications to your pharmacy for you to pick up at your convenience: Levsin 1-2 tablets every 6 hours as needed for cramping Zofran 1-2 tablets every 8 hours as needed for nausea  You have been scheduled for an MRI/MRCP abdomen/pelvis at ______________ on ____________. Your appointment time is ______________. Please arrive 15 minutes prior to your appointment time for registration purposes. Please make certain not to have anything to eat or drink 6 hours prior to your test. In addition, if you have any metal in your body, have a pacemaker or defibrillator, please be sure to let your ordering physician know. This test typically takes 45 minutes to 1 hour to complete.  If you are age 27 or older, your body mass index should be between 23-30. Your Body mass index is 18.42 kg/m. If this is out of the aforementioned range listed, please consider follow up with your Primary Care Provider.  If you are age 4 or younger, your body mass index should be between 19-25. Your Body mass index is 18.42 kg/m. If this is out of the aformentioned range listed, please consider follow up with your Primary Care Provider.

## 2016-04-26 NOTE — Telephone Encounter (Signed)
Patient has been scheduled for MRI/MRCP W/WO Abdomen/Pelvis at [facility] on Saturday, 04/29/16 @ 9 am. Patient should arrive at 8:45 am and have nothing to eat or drink after midnight. I have spoken to patient to advise her of time/date/location/prep for test and she verbalizes understanding.

## 2016-04-27 LAB — TISSUE TRANSGLUTAMINASE, IGA: Tissue Transglutaminase Ab, IgA: 1 U/mL (ref <4)

## 2016-04-29 ENCOUNTER — Other Ambulatory Visit: Payer: Self-pay | Admitting: Internal Medicine

## 2016-04-29 ENCOUNTER — Ambulatory Visit (HOSPITAL_COMMUNITY)
Admission: RE | Admit: 2016-04-29 | Discharge: 2016-04-29 | Disposition: A | Discharge status: Home / Self Care | Payer: Medicare Other | Source: Ambulatory Visit | Attending: Internal Medicine | Admitting: Internal Medicine

## 2016-04-29 DIAGNOSIS — R634 Abnormal weight loss: Secondary | ICD-10-CM | POA: Diagnosis present

## 2016-04-29 DIAGNOSIS — R110 Nausea: Secondary | ICD-10-CM | POA: Diagnosis present

## 2016-04-29 DIAGNOSIS — K838 Other specified diseases of biliary tract: Secondary | ICD-10-CM | POA: Insufficient documentation

## 2016-04-29 DIAGNOSIS — D1809 Hemangioma of other sites: Secondary | ICD-10-CM | POA: Insufficient documentation

## 2016-04-29 DIAGNOSIS — K7689 Other specified diseases of liver: Secondary | ICD-10-CM | POA: Insufficient documentation

## 2016-04-29 MED ORDER — GADOBENATE DIMEGLUMINE 529 MG/ML IV SOLN
10.0000 mL | Freq: Once | INTRAVENOUS | Status: AC | PRN
  Administered 2016-04-29: 10 mL via INTRAVENOUS

## 2016-05-04 ENCOUNTER — Telehealth: Payer: Self-pay | Admitting: Internal Medicine

## 2016-05-04 NOTE — Telephone Encounter (Signed)
Pt had questions regarding her stool tests and how to do them. Pt given the phone number for the lab to call, [phone]. Pt aware of results per Dr. Carlean Purl. Will await input from Dr. Hilarie Fredrickson, pt aware.

## 2016-05-04 NOTE — Telephone Encounter (Signed)
Pyrtle pt calling for MR/MRCP results. Dr. Carlean Purl as doc of the day please advise.

## 2016-05-04 NOTE — Telephone Encounter (Signed)
The MRI/MRCP shows bile and pancreatic duct tilation, liver cysts and liver hemangoomas  Nothing bad and nothing that definitely explains sxs  Await Dr. Vena Rua input

## 2016-05-04 NOTE — Telephone Encounter (Signed)
And Pt is wanting to know with the stool cards if she needs to get the sample from just one BM or multiple BM's

## 2016-05-09 ENCOUNTER — Telehealth: Payer: Self-pay | Admitting: Internal Medicine

## 2016-05-10 NOTE — Telephone Encounter (Signed)
Refaxed release of info.

## 2016-05-12 ENCOUNTER — Telehealth: Payer: Self-pay

## 2016-05-12 NOTE — Telephone Encounter (Signed)
I have reviewed the patient's GI records from Dr. Earlean Shawl which we received yesterday I also spoke to her son-in-law, Dr. Gilford Raid by phone earlier this week regarding her care at her request and with her permission She continues to have episodic issues with nausea, vomiting poor appetite and anorexia. The loose stool seem to come and go. She has had numerous endoscopies with Dr. Earlean Shawl, most recently in July 2014. This was notable for a gastric tubular adenoma. Other biopsies from the stomach were negative for H. pylori and dysplasia. 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 Prior to this she had upper endoscopy in 2013, 2012 and 2000 Of note her HIDA scan several years ago-induced abdominal symptoms plus diarrhea which continued to be her complaints.  My suspicion is for biliary dyskinesia and dysfunction leading to intermittent symptoms which can be quite debilitating. I would recommend referral to [facility] for consideration of cholecystectomy I would first recommend repeating upper endoscopy given her history of gastric adenoma (gastric adenomas uncommon and warrant follow-up, especially in light of her symptoms.  Though it should be noted symptoms existed before the discovered and removal of gastric adenoma in 2014). Upper endoscopy can be scheduled and referral for surgical opinion can also go for the same time.

## 2016-05-12 NOTE — Telephone Encounter (Signed)
Pt calling asking about her surgical referral for gallbladder. Dr. Hilarie Fredrickson please advise.

## 2016-05-12 NOTE — Telephone Encounter (Signed)
Pt scheduled for previsit 05/15/16@1pm , EGD scheduled in the [facility] 05/25/16@11am . Pt aware of appts. Referral faxed to [facility] for OV with Dr. Donne Hazel for gallbladder surgery.

## 2016-05-15 ENCOUNTER — Ambulatory Visit (AMBULATORY_SURGERY_CENTER): Payer: Self-pay | Admitting: *Deleted

## 2016-05-15 VITALS — Ht 67.0 in | Wt 120.0 lb

## 2016-05-15 DIAGNOSIS — R112 Nausea with vomiting, unspecified: Secondary | ICD-10-CM

## 2016-05-15 NOTE — Progress Notes (Signed)
No egg or soy allergy known to patient  No issues with past sedation with any surgeries  or procedures, no intubation problems  No diet pills per patient No home 02 use per patient  No blood thinners per patient  Pt denies issues with constipation  No A fib or A flutter   

## 2016-05-15 NOTE — Telephone Encounter (Signed)
Pt scheduled to see Dr. Donne Hazel 06/07/16. Arrival time 9:20am,

## 2016-05-16 NOTE — Telephone Encounter (Signed)
Spoke with pt and she is aware of appt date and time. Pt scheduled for 06/07/16@9 :50am, pt to arrive there at 9:20am. Pt aware.

## 2016-05-25 ENCOUNTER — Ambulatory Visit (AMBULATORY_SURGERY_CENTER): Payer: Medicare Other | Admitting: Internal Medicine

## 2016-05-25 ENCOUNTER — Encounter: Payer: Self-pay | Admitting: Internal Medicine

## 2016-05-25 VITALS — BP 152/79 | HR 76 | Temp 98.7°F | Resp 9 | Ht 67.0 in | Wt 120.0 lb

## 2016-05-25 DIAGNOSIS — K2950 Unspecified chronic gastritis without bleeding: Secondary | ICD-10-CM | POA: Diagnosis not present

## 2016-05-25 DIAGNOSIS — Z8719 Personal history of other diseases of the digestive system: Secondary | ICD-10-CM | POA: Diagnosis not present

## 2016-05-25 DIAGNOSIS — K2970 Gastritis, unspecified, without bleeding: Secondary | ICD-10-CM | POA: Diagnosis not present

## 2016-05-25 DIAGNOSIS — R112 Nausea with vomiting, unspecified: Secondary | ICD-10-CM | POA: Diagnosis not present

## 2016-05-25 MED ORDER — SODIUM CHLORIDE 0.9 % IV SOLN: 500.0000 mL | INTRAVENOUS | Status: DC

## 2016-05-25 MED ORDER — FLUCONAZOLE 100 MG PO TABS: ORAL_TABLET | ORAL | 0 refills | Status: DC

## 2016-05-25 NOTE — Op Note (Signed)
[facility] Patient Name: [name] Procedure Date: 05/25/2016 10:35 AM MRN: [id number] Endoscopist: Jerene Bears , MD Age: 70 Referring MD:  Date of Birth: 11-Jul-1946 Gender: Female Account #: [id number] Procedure:                Upper GI endoscopy Indications:              Anorexia, Diarrhea, Nausea with vomiting, Weight                            loss, history of gastric adenoma Medicines:                Monitored Anesthesia Care Procedure:                Pre-Anesthesia Assessment:                           - Prior to the procedure, a History and Physical                            was performed, and patient medications and                            allergies were reviewed. The patient's tolerance of                            previous anesthesia was also reviewed. The risks                            and benefits of the procedure and the sedation                            options and risks were discussed with the patient.                            All questions were answered, and informed consent                            was obtained. Prior Anticoagulants: The patient has                            taken no previous anticoagulant or antiplatelet                            agents. ASA Grade Assessment: III - A patient with                            severe systemic disease. After reviewing the risks                            and benefits, the patient was deemed in                            satisfactory condition to undergo the procedure.  After obtaining informed consent, the endoscope was                            passed under direct vision. Throughout the                            procedure, the patient's blood pressure, pulse, and                            oxygen saturations were monitored continuously. The                            Model GIF-HQ190 [id number]) scope was introduced                            through the mouth,  and advanced to the third part                            of duodenum. The upper GI endoscopy was                            accomplished without difficulty. The patient                            tolerated the procedure well. Scope In: Scope Out: Findings:                 Candidiasis was found in the upper third of the                            esophagus and in the middle third of the esophagus.                           The middle third of the esophagus and lower third                            of the esophagus were moderately tortuous.                           A mild Schatzki ring (acquired) was found at the                            gastroesophageal junction.                           A 2 cm hiatal hernia was present.                           Localized moderate inflammation characterized by                            erythema and intestinalized appearing mucosa was                            found in the prepyloric  region of the stomach and                            at the pylorus. Multiple biopsies were obtained                            with cold forceps for histology and Helicobacter                            pylori testing in a targeted manner.                           Diffuse mild inflammation characterized by erythema                            was found in the gastric body and in the gastric                            antrum. Biopsies were taken with a cold forceps for                            histology and Helicobacter pylori testing.                           The examined duodenum was normal. Complications:            No immediate complications. Estimated Blood Loss:     Estimated blood loss was minimal. Impression:               - Monilial esophagitis.                           - Tortuous esophagus.                           - Mild Schatzki ring.                           - 2 cm hiatal hernia.                           - Gastritis.                           -  Gastritis. Biopsied.                           - Normal examined duodenum.                           - Multiple biopsies were obtained. Recommendation:           - Patient has a contact number available for                            emergencies. The signs and symptoms of potential  delayed complications were discussed with the                            patient. Return to normal activities tomorrow.                            Written discharge instructions were provided to the                            patient.                           - Resume previous diet.                           - Continue present medications.                           - Await pathology results.                           - Diflucan (fluconazole) 200 mg PO x 1 day, then                            100 mg daily for 13 days.                           - Proceed with surgical consultation with Dr.                            Donne Hazel.                           - Need for repeat EGD will be based on pathology                            results. Jerene Bears, MD 05/25/2016 10:57:17 AM This report has been signed electronically.

## 2016-05-25 NOTE — Progress Notes (Signed)
A and O x3. Report to RN. Tolerated MAC anesthesia well.Teeth unchanged after procedure. 

## 2016-05-25 NOTE — Progress Notes (Signed)
Called to room to assist during endoscopic procedure.  Patient ID and intended procedure confirmed with present staff. Received instructions for my participation in the procedure from the performing physician.  

## 2016-05-25 NOTE — Patient Instructions (Signed)
Impressions/Recommendations:   Hiatal Hernia handout given. Gastritis handout given. Esophagitis handout given.  Diflucan as directed. YOU HAD AN ENDOSCOPIC PROCEDURE TODAY AT [facility]:   Refer to the procedure report that was given to you for any specific questions about what was found during the examination.  If the procedure report does not answer your questions, please call your gastroenterologist to clarify.  If you requested that your care partner not be given the details of your procedure findings, then the procedure report has been included in a sealed envelope for you to review at your convenience later.  YOU SHOULD EXPECT: Some feelings of bloating in the abdomen. Passage of more gas than usual.  Walking can help get rid of the air that was put into your GI tract during the procedure and reduce the bloating. If you had a lower endoscopy (such as a colonoscopy or flexible sigmoidoscopy) you may notice spotting of blood in your stool or on the toilet paper. If you underwent a bowel prep for your procedure, you may not have a normal bowel movement for a few days.  Please Note:  You might notice some irritation and congestion in your nose or some drainage.  This is from the oxygen used during your procedure.  There is no need for concern and it should clear up in a day or so.  SYMPTOMS TO REPORT IMMEDIATELY:     Following upper endoscopy (EGD)  Vomiting of blood or coffee ground material  New chest pain or pain under the shoulder blades  Painful or persistently difficult swallowing  New shortness of breath  Fever of 100F or higher  Black, tarry-looking stools  For urgent or emergent issues, a gastroenterologist can be reached at any hour by calling [phone].   DIET:  We do recommend a small meal at first, but then you may proceed to your regular diet.  Drink plenty of fluids but you should avoid alcoholic beverages for 24 hours.  ACTIVITY:  You should  plan to take it easy for the rest of today and you should NOT DRIVE or use heavy machinery until tomorrow (because of the sedation medicines used during the test).    FOLLOW UP: Our staff will call the number listed on your records the next business day following your procedure to check on you and address any questions or concerns that you may have regarding the information given to you following your procedure. If we do not reach you, we will leave a message.  However, if you are feeling well and you are not experiencing any problems, there is no need to return our call.  We will assume that you have returned to your regular daily activities without incident.  If any biopsies were taken you will be contacted by phone or by letter within the next 1-3 weeks.  Please call us at [phone] if you have not heard about the biopsies in 3 weeks.    SIGNATURES/CONFIDENTIALITY: You and/or your care partner have signed paperwork which will be entered into your electronic medical record.  These signatures attest to the fact that that the information above on your After Visit Summary has been reviewed and is understood.  Full responsibility of the confidentiality of this discharge information lies with you and/or your care-partner.

## 2016-05-26 ENCOUNTER — Telehealth: Payer: Self-pay | Admitting: *Deleted

## 2016-05-26 NOTE — Telephone Encounter (Signed)
  Follow up Call-  Call back number 05/25/2016  Post procedure Call Back phone  # [phone]  Permission to leave phone message Yes  Some recent data might be hidden     Patient questions:  Do you have a fever, pain , or abdominal swelling? No. Pain Score  0 *  Have you tolerated food without any problems? Yes.    Have you been able to return to your normal activities? Yes.    Do you have any questions about your discharge instructions: Diet   No. Medications  No. Follow up visit  No.  Do you have questions or concerns about your Care? No.  Actions: * If pain score is 4 or above: No action needed, pain <4.

## 2016-06-02 DIAGNOSIS — M1711 Unilateral primary osteoarthritis, right knee: Secondary | ICD-10-CM | POA: Diagnosis not present

## 2016-06-02 DIAGNOSIS — M25561 Pain in right knee: Secondary | ICD-10-CM | POA: Diagnosis not present

## 2016-06-07 DIAGNOSIS — R1011 Right upper quadrant pain: Secondary | ICD-10-CM | POA: Diagnosis not present

## 2016-06-08 ENCOUNTER — Other Ambulatory Visit: Payer: Self-pay | Admitting: Internal Medicine

## 2016-06-08 ENCOUNTER — Telehealth: Payer: Self-pay | Admitting: Internal Medicine

## 2016-06-08 NOTE — Telephone Encounter (Signed)
Dr. Hilarie Fredrickson please advise regarding if you have spoken with Dr. Donne Hazel and what plan may be.

## 2016-06-09 ENCOUNTER — Telehealth: Payer: Self-pay | Admitting: Internal Medicine

## 2016-06-09 DIAGNOSIS — R110 Nausea: Secondary | ICD-10-CM

## 2016-06-09 DIAGNOSIS — G8929 Other chronic pain: Secondary | ICD-10-CM | POA: Diagnosis not present

## 2016-06-09 DIAGNOSIS — M25561 Pain in right knee: Secondary | ICD-10-CM | POA: Diagnosis not present

## 2016-06-09 DIAGNOSIS — M1711 Unilateral primary osteoarthritis, right knee: Secondary | ICD-10-CM | POA: Diagnosis not present

## 2016-06-09 MED ORDER — ONDANSETRON 4 MG PO TBDP: ORAL_TABLET | ORAL | 0 refills | Status: DC

## 2016-06-09 NOTE — Telephone Encounter (Signed)
Per Dr Hilarie Fredrickson verbal orders, patient needs gastric emptying scan.

## 2016-06-09 NOTE — Telephone Encounter (Signed)
I have spoken to patient to advise that Dr Hilarie Fredrickson has recommended GES. She is agreeable to this. She has been scheduled for Friday, 06/17/16 @ 9 am, [facility]. NPO midnight, hold stomach meds 48 hours prior to test. Patient verbalizes understanding. Per Dr Hilarie Fredrickson, patient may have a refill of Zofran until GES. Rx sent.

## 2016-06-12 NOTE — Telephone Encounter (Signed)
I have discussed with Dr. Donne Hazel at length He will await GES, already scheduled and if neg proceed with lap chole

## 2016-06-12 NOTE — Telephone Encounter (Signed)
Spoke with pt and she is aware.

## 2016-06-16 ENCOUNTER — Other Ambulatory Visit: Payer: Self-pay | Admitting: General Surgery

## 2016-06-16 ENCOUNTER — Encounter (HOSPITAL_COMMUNITY)
Admission: RE | Admit: 2016-06-16 | Discharge: 2016-06-16 | Disposition: A | Discharge status: Home / Self Care | Payer: Medicare Other | Source: Ambulatory Visit | Attending: Internal Medicine | Admitting: Internal Medicine

## 2016-06-16 DIAGNOSIS — R109 Unspecified abdominal pain: Secondary | ICD-10-CM | POA: Diagnosis not present

## 2016-06-16 DIAGNOSIS — R110 Nausea: Secondary | ICD-10-CM | POA: Diagnosis not present

## 2016-06-16 MED ORDER — TECHNETIUM TC 99M SULFUR COLLOID
2.0000 | Freq: Once | INTRAVENOUS | Status: AC | PRN
Start: 2016-06-16 — End: 2016-06-16
  Administered 2016-06-16: 2 via ORAL

## 2016-06-19 DIAGNOSIS — Z681 Body mass index (BMI) 19 or less, adult: Secondary | ICD-10-CM | POA: Diagnosis not present

## 2016-06-19 DIAGNOSIS — M1711 Unilateral primary osteoarthritis, right knee: Secondary | ICD-10-CM | POA: Diagnosis not present

## 2016-06-19 DIAGNOSIS — M25561 Pain in right knee: Secondary | ICD-10-CM | POA: Diagnosis not present

## 2016-06-19 DIAGNOSIS — Z23 Encounter for immunization: Secondary | ICD-10-CM | POA: Diagnosis not present

## 2016-06-19 DIAGNOSIS — I10 Essential (primary) hypertension: Secondary | ICD-10-CM | POA: Diagnosis not present

## 2016-06-20 DIAGNOSIS — H43813 Vitreous degeneration, bilateral: Secondary | ICD-10-CM | POA: Diagnosis not present

## 2016-06-20 DIAGNOSIS — H04123 Dry eye syndrome of bilateral lacrimal glands: Secondary | ICD-10-CM | POA: Diagnosis not present

## 2016-06-20 DIAGNOSIS — H524 Presbyopia: Secondary | ICD-10-CM | POA: Diagnosis not present

## 2016-06-20 DIAGNOSIS — H4720 Unspecified optic atrophy: Secondary | ICD-10-CM | POA: Diagnosis not present

## 2016-06-27 ENCOUNTER — Encounter (HOSPITAL_COMMUNITY): Payer: Self-pay

## 2016-06-27 ENCOUNTER — Encounter (HOSPITAL_COMMUNITY)
Admission: RE | Admit: 2016-06-27 | Discharge: 2016-06-27 | Disposition: A | Discharge status: Home / Self Care | Payer: Medicare Other | Source: Ambulatory Visit | Attending: General Surgery | Admitting: General Surgery

## 2016-06-27 DIAGNOSIS — K829 Disease of gallbladder, unspecified: Secondary | ICD-10-CM

## 2016-06-27 DIAGNOSIS — Z01812 Encounter for preprocedural laboratory examination: Secondary | ICD-10-CM

## 2016-06-27 HISTORY — DX: Chronic obstructive pulmonary disease, unspecified: J44.9

## 2016-06-27 HISTORY — DX: Major depressive disorder, single episode, unspecified: F32.9

## 2016-06-27 HISTORY — DX: Depression, unspecified: F32.A

## 2016-06-27 LAB — CBC WITH DIFFERENTIAL/PLATELET
Basophils Absolute: 0 10*3/uL (ref 0.0–0.1)
Basophils Relative: 0 %
Eosinophils Absolute: 0.1 10*3/uL (ref 0.0–0.7)
Eosinophils Relative: 1 %
HCT: 37.1 % (ref 36.0–46.0)
Hemoglobin: 12.2 g/dL (ref 12.0–15.0)
Lymphocytes Relative: 37 %
Lymphs Abs: 2 10*3/uL (ref 0.7–4.0)
MCH: 31.8 pg (ref 26.0–34.0)
MCHC: 32.9 g/dL (ref 30.0–36.0)
MCV: 96.6 fL (ref 78.0–100.0)
Monocytes Absolute: 0.4 10*3/uL (ref 0.1–1.0)
Monocytes Relative: 8 %
Neutro Abs: 2.9 10*3/uL (ref 1.7–7.7)
Neutrophils Relative %: 54 %
Platelets: 283 10*3/uL (ref 150–400)
RBC: 3.84 MIL/uL — ABNORMAL LOW (ref 3.87–5.11)
RDW: 12 % (ref 11.5–15.5)
WBC: 5.5 10*3/uL (ref 4.0–10.5)

## 2016-06-27 LAB — COMPREHENSIVE METABOLIC PANEL
ALT: 12 U/L — ABNORMAL LOW (ref 14–54)
AST: 15 U/L (ref 15–41)
Albumin: 3.9 g/dL (ref 3.5–5.0)
Alkaline Phosphatase: 48 U/L (ref 38–126)
Anion gap: 8 (ref 5–15)
BUN: 13 mg/dL (ref 6–20)
CO2: 25 mmol/L (ref 22–32)
Calcium: 9.7 mg/dL (ref 8.9–10.3)
Chloride: 103 mmol/L (ref 101–111)
Creatinine, Ser: 1.01 mg/dL — ABNORMAL HIGH (ref 0.44–1.00)
GFR calc Af Amer: >60 mL/min (ref >60)
GFR calc non Af Amer: 55 mL/min — ABNORMAL LOW (ref >60)
Glucose, Bld: 97 mg/dL (ref 65–99)
Potassium: 4.8 mmol/L (ref 3.5–5.1)
Sodium: 136 mmol/L (ref 135–145)
Total Bilirubin: 0.5 mg/dL (ref 0.3–1.2)
Total Protein: 6.2 g/dL — ABNORMAL LOW (ref 6.5–8.1)

## 2016-06-27 NOTE — Pre-Procedure Instructions (Signed)
    SHONA PREVO  06/27/2016    Your procedure is scheduled on Wednesday, November 8.  Report to [facility] Admitting at 6:30 AM                 Your surgery or procedure is scheduled for 8:30 AM   Call this number if you have problems the morning of [phone]   Remember:  Do not eat food or drink liquids after midnight.  Take these medicines the morning of surgery with A SIP OF WATER :buPROPion (WELLBUTRIN), levothyroxine (SYNTHROID, LEVOTHROID), omeprazole (PRILOSEC).              Take if needed and you tolerate it on an empty stomach HYDROcodone-acetaminophen (NORCO/VICODIN).  ondansetron (ZOFRAN ODT) if needed.               Stop taking Aspirin, Vitamins and herbal medications.  Do not take any NSAIDS ie:  Ibuprofen, Advil, Naproxen.   Do not wear jewelry, make-up or nail polish.  Do not wear lotions, powders, or perfumes, or deodorant.  Do not shave 48 hours prior to surgery.     Do not bring valuables to the hospital.  [facility] is not responsible for any belongings or valuables.  Contacts, dentures or bridgework may not be worn into surgery.  Leave your suitcase in the car.  After surgery it may be brought to your room.  For patients admitted to the hospital, discharge time will be determined by your treatment team.  Patients discharged the day of surgery will not be allowed to drive home.   Special instructions: Review  [place] - Preparing For Surgery.   Please read over the following fact sheets that you were given: [place]- Preparing For Surgery, Coughing and Deep Breathing, Pain Booklet

## 2016-06-27 NOTE — Progress Notes (Addendum)
[name] reports that she had an EKG at Dr Donell Beers office in March of this year. I requested EKG and office notes.  I received EKG from Dr Donell Beers office and th EKG was done in 2016, will have to do EKG morning of surgery.

## 2016-06-28 ENCOUNTER — Encounter (HOSPITAL_COMMUNITY): Payer: Self-pay | Admitting: Certified Registered"

## 2016-06-28 ENCOUNTER — Ambulatory Visit (HOSPITAL_COMMUNITY): Payer: Medicare Other | Admitting: Certified Registered"

## 2016-06-28 ENCOUNTER — Encounter (HOSPITAL_COMMUNITY)
Admission: RE | Disposition: A | Discharge status: Home / Self Care | Payer: Self-pay | Source: Ambulatory Visit | Attending: General Surgery

## 2016-06-28 ENCOUNTER — Inpatient Hospital Stay (HOSPITAL_COMMUNITY)
Admission: RE | Admit: 2016-06-28 | Discharge: 2016-07-03 | DRG: 417 | Disposition: A | Discharge status: Home / Self Care | Payer: Medicare Other | Source: Ambulatory Visit | Attending: General Surgery | Admitting: General Surgery

## 2016-06-28 ENCOUNTER — Ambulatory Visit (HOSPITAL_COMMUNITY): Payer: Medicare Other

## 2016-06-28 DIAGNOSIS — Z419 Encounter for procedure for purposes other than remedying health state, unspecified: Secondary | ICD-10-CM

## 2016-06-28 DIAGNOSIS — K567 Ileus, unspecified: Secondary | ICD-10-CM

## 2016-06-28 DIAGNOSIS — I493 Ventricular premature depolarization: Secondary | ICD-10-CM | POA: Diagnosis not present

## 2016-06-28 DIAGNOSIS — I472 Ventricular tachycardia: Secondary | ICD-10-CM | POA: Diagnosis not present

## 2016-06-28 DIAGNOSIS — Z01812 Encounter for preprocedural laboratory examination: Secondary | ICD-10-CM | POA: Diagnosis not present

## 2016-06-28 DIAGNOSIS — Z681 Body mass index (BMI) 19 or less, adult: Secondary | ICD-10-CM | POA: Diagnosis not present

## 2016-06-28 DIAGNOSIS — K833 Fistula of bile duct: Secondary | ICD-10-CM | POA: Diagnosis not present

## 2016-06-28 DIAGNOSIS — K824 Cholesterolosis of gallbladder: Secondary | ICD-10-CM | POA: Diagnosis not present

## 2016-06-28 DIAGNOSIS — E43 Unspecified severe protein-calorie malnutrition: Secondary | ICD-10-CM | POA: Insufficient documentation

## 2016-06-28 DIAGNOSIS — E119 Type 2 diabetes mellitus without complications: Secondary | ICD-10-CM | POA: Diagnosis not present

## 2016-06-28 DIAGNOSIS — K9189 Other postprocedural complications and disorders of digestive system: Secondary | ICD-10-CM

## 2016-06-28 DIAGNOSIS — G8918 Other acute postprocedural pain: Secondary | ICD-10-CM | POA: Diagnosis not present

## 2016-06-28 DIAGNOSIS — K660 Peritoneal adhesions (postprocedural) (postinfection): Secondary | ICD-10-CM | POA: Diagnosis not present

## 2016-06-28 DIAGNOSIS — E876 Hypokalemia: Secondary | ICD-10-CM | POA: Diagnosis not present

## 2016-06-28 DIAGNOSIS — E079 Disorder of thyroid, unspecified: Secondary | ICD-10-CM | POA: Diagnosis present

## 2016-06-28 DIAGNOSIS — K589 Irritable bowel syndrome without diarrhea: Secondary | ICD-10-CM | POA: Diagnosis present

## 2016-06-28 DIAGNOSIS — K811 Chronic cholecystitis: Principal | ICD-10-CM | POA: Diagnosis present

## 2016-06-28 DIAGNOSIS — Z880 Allergy status to penicillin: Secondary | ICD-10-CM

## 2016-06-28 DIAGNOSIS — Z9049 Acquired absence of other specified parts of digestive tract: Secondary | ICD-10-CM

## 2016-06-28 DIAGNOSIS — I10 Essential (primary) hypertension: Secondary | ICD-10-CM | POA: Diagnosis not present

## 2016-06-28 DIAGNOSIS — K219 Gastro-esophageal reflux disease without esophagitis: Secondary | ICD-10-CM | POA: Diagnosis present

## 2016-06-28 DIAGNOSIS — J449 Chronic obstructive pulmonary disease, unspecified: Secondary | ICD-10-CM | POA: Diagnosis not present

## 2016-06-28 DIAGNOSIS — E860 Dehydration: Secondary | ICD-10-CM | POA: Diagnosis not present

## 2016-06-28 DIAGNOSIS — D1803 Hemangioma of intra-abdominal structures: Secondary | ICD-10-CM | POA: Diagnosis present

## 2016-06-28 DIAGNOSIS — Z87891 Personal history of nicotine dependence: Secondary | ICD-10-CM

## 2016-06-28 DIAGNOSIS — M1990 Unspecified osteoarthritis, unspecified site: Secondary | ICD-10-CM | POA: Diagnosis present

## 2016-06-28 DIAGNOSIS — R1011 Right upper quadrant pain: Secondary | ICD-10-CM | POA: Diagnosis not present

## 2016-06-28 DIAGNOSIS — Z905 Acquired absence of kidney: Secondary | ICD-10-CM

## 2016-06-28 DIAGNOSIS — Z8711 Personal history of peptic ulcer disease: Secondary | ICD-10-CM

## 2016-06-28 HISTORY — DX: Migraine, unspecified, not intractable, without status migrainosus: G43.909

## 2016-06-28 HISTORY — PX: CHOLECYSTECTOMY: SHX55

## 2016-06-28 HISTORY — DX: Chronic kidney disease, unspecified: N18.9

## 2016-06-28 HISTORY — DX: Low back pain, unspecified: M54.50

## 2016-06-28 HISTORY — DX: Other chronic pain: G89.29

## 2016-06-28 HISTORY — PX: LAPAROSCOPIC CHOLECYSTECTOMY: SUR755

## 2016-06-28 HISTORY — DX: Low back pain: M54.5

## 2016-06-28 HISTORY — DX: Personal history of other medical treatment: Z92.89

## 2016-06-28 SURGERY — LAPAROSCOPIC CHOLECYSTECTOMY WITH INTRAOPERATIVE CHOLANGIOGRAM
Anesthesia: General | Site: Abdomen

## 2016-06-28 MED ORDER — CIPROFLOXACIN IN D5W 400 MG/200ML IV SOLN
400.0000 mg | INTRAVENOUS | Status: AC
  Administered 2016-06-28: 400 mg via INTRAVENOUS
  Filled 2016-06-28: qty 200

## 2016-06-28 MED ORDER — BOOST / RESOURCE BREEZE PO LIQD: 1.0000 | Freq: Three times a day (TID) | ORAL | Status: DC

## 2016-06-28 MED ORDER — DEXAMETHASONE SODIUM PHOSPHATE 10 MG/ML IJ SOLN
INTRAMUSCULAR | Status: AC
  Filled 2016-06-28: qty 1

## 2016-06-28 MED ORDER — FENTANYL CITRATE (PF) 100 MCG/2ML IJ SOLN
INTRAMUSCULAR | Status: AC
  Filled 2016-06-28: qty 2

## 2016-06-28 MED ORDER — SODIUM CHLORIDE 0.9 % IV SOLN
INTRAVENOUS | Status: DC
  Administered 2016-06-28: 20:00:00 via INTRAVENOUS

## 2016-06-28 MED ORDER — MORPHINE SULFATE (PF) 2 MG/ML IV SOLN: 2.0000 mg | INTRAVENOUS | Status: DC | PRN

## 2016-06-28 MED ORDER — ZOLPIDEM TARTRATE 5 MG PO TABS
5.0000 mg | ORAL_TABLET | Freq: Every evening | ORAL | Status: DC | PRN
  Administered 2016-06-29: 5 mg via ORAL
  Filled 2016-06-28: qty 1

## 2016-06-28 MED ORDER — PROMETHAZINE HCL 25 MG/ML IJ SOLN
12.5000 mg | Freq: Once | INTRAMUSCULAR | Status: AC
  Administered 2016-06-29: 12.5 mg via INTRAVENOUS
  Filled 2016-06-28: qty 1

## 2016-06-28 MED ORDER — HYDROCODONE-ACETAMINOPHEN 5-325 MG PO TABS
1.0000 | ORAL_TABLET | ORAL | Status: DC | PRN
  Administered 2016-07-01 (×4): 2 via ORAL
  Administered 2016-07-02: 1 via ORAL
  Administered 2016-07-02 – 2016-07-03 (×6): 2 via ORAL
  Filled 2016-06-28 (×2): qty 2
  Filled 2016-06-28: qty 1
  Filled 2016-06-28 (×8): qty 2

## 2016-06-28 MED ORDER — BUPIVACAINE HCL (PF) 0.25 % IJ SOLN
INTRAMUSCULAR | Status: AC
  Filled 2016-06-28: qty 30

## 2016-06-28 MED ORDER — OXYCODONE HCL 5 MG PO TABS
ORAL_TABLET | ORAL | Status: AC
  Administered 2016-06-28: 10 mg via ORAL
  Filled 2016-06-28: qty 2

## 2016-06-28 MED ORDER — MIDAZOLAM HCL 5 MG/5ML IJ SOLN
INTRAMUSCULAR | Status: DC | PRN
  Administered 2016-06-28: 2 mg via INTRAVENOUS

## 2016-06-28 MED ORDER — METHOCARBAMOL 500 MG PO TABS
500.0000 mg | ORAL_TABLET | Freq: Four times a day (QID) | ORAL | Status: DC | PRN
  Administered 2016-06-28 – 2016-07-03 (×6): 500 mg via ORAL
  Filled 2016-06-28 (×6): qty 1

## 2016-06-28 MED ORDER — SODIUM CHLORIDE 0.9% FLUSH: 3.0000 mL | Freq: Two times a day (BID) | INTRAVENOUS | Status: DC

## 2016-06-28 MED ORDER — PROPOFOL 10 MG/ML IV BOLUS
INTRAVENOUS | Status: DC | PRN
  Administered 2016-06-28: 140 mg via INTRAVENOUS

## 2016-06-28 MED ORDER — PANTOPRAZOLE SODIUM 40 MG PO TBEC
40.0000 mg | DELAYED_RELEASE_TABLET | Freq: Every day | ORAL | Status: DC
  Administered 2016-06-29: 40 mg via ORAL
  Filled 2016-06-28: qty 1

## 2016-06-28 MED ORDER — HYDROMORPHONE HCL 1 MG/ML IJ SOLN
0.2500 mg | INTRAMUSCULAR | Status: DC | PRN
  Administered 2016-06-28 (×4): 0.5 mg via INTRAVENOUS

## 2016-06-28 MED ORDER — BUPIVACAINE HCL (PF) 0.25 % IJ SOLN
INTRAMUSCULAR | Status: DC | PRN
  Administered 2016-06-28: 9 mL

## 2016-06-28 MED ORDER — HYDROMORPHONE HCL 2 MG/ML IJ SOLN
INTRAMUSCULAR | Status: AC
  Filled 2016-06-28: qty 1

## 2016-06-28 MED ORDER — ONDANSETRON HCL 4 MG/2ML IJ SOLN
4.0000 mg | Freq: Once | INTRAMUSCULAR | Status: AC
  Administered 2016-06-28: 4 mg via INTRAVENOUS
  Filled 2016-06-28: qty 2

## 2016-06-28 MED ORDER — IOPAMIDOL (ISOVUE-300) INJECTION 61%
INTRAVENOUS | Status: AC
  Filled 2016-06-28: qty 50

## 2016-06-28 MED ORDER — MIDAZOLAM HCL 2 MG/2ML IJ SOLN
INTRAMUSCULAR | Status: AC
Start: 2016-06-28 — End: 2016-06-28
  Filled 2016-06-28: qty 2

## 2016-06-28 MED ORDER — LEVOTHYROXINE SODIUM 100 MCG PO TABS
100.0000 ug | ORAL_TABLET | Freq: Every day | ORAL | Status: DC
  Administered 2016-06-29 – 2016-07-03 (×4): 100 ug via ORAL
  Filled 2016-06-28 (×4): qty 1

## 2016-06-28 MED ORDER — LIDOCAINE 2% (20 MG/ML) 5 ML SYRINGE
INTRAMUSCULAR | Status: DC | PRN
  Administered 2016-06-28: 70 mg via INTRAVENOUS

## 2016-06-28 MED ORDER — FLUOXETINE HCL 20 MG PO CAPS
20.0000 mg | ORAL_CAPSULE | Freq: Every day | ORAL | Status: DC
  Administered 2016-06-28 – 2016-07-02 (×4): 20 mg via ORAL
  Filled 2016-06-28 (×5): qty 1

## 2016-06-28 MED ORDER — MIDAZOLAM HCL 2 MG/2ML IJ SOLN
0.5000 mg | Freq: Once | INTRAMUSCULAR | Status: AC
  Administered 2016-06-28: 2 mg via INTRAVENOUS

## 2016-06-28 MED ORDER — FELODIPINE ER 5 MG PO TB24
5.0000 mg | ORAL_TABLET | Freq: Every day | ORAL | Status: DC
  Administered 2016-06-28 – 2016-07-02 (×5): 5 mg via ORAL
  Filled 2016-06-28 (×5): qty 1

## 2016-06-28 MED ORDER — BUPROPION HCL ER (SR) 150 MG PO TB12
150.0000 mg | ORAL_TABLET | Freq: Every day | ORAL | Status: DC
  Administered 2016-06-29 – 2016-07-03 (×4): 150 mg via ORAL
  Filled 2016-06-28 (×4): qty 1

## 2016-06-28 MED ORDER — SUGAMMADEX SODIUM 200 MG/2ML IV SOLN
INTRAVENOUS | Status: DC | PRN
  Administered 2016-06-28: 100 mg via INTRAVENOUS

## 2016-06-28 MED ORDER — ONDANSETRON 4 MG PO TBDP: 4.0000 mg | ORAL_TABLET | Freq: Four times a day (QID) | ORAL | Status: DC | PRN

## 2016-06-28 MED ORDER — FENTANYL CITRATE (PF) 100 MCG/2ML IJ SOLN
INTRAMUSCULAR | Status: AC
  Filled 2016-06-28: qty 4

## 2016-06-28 MED ORDER — FENTANYL 50 MCG/HR TD PT72
100.0000 ug | MEDICATED_PATCH | TRANSDERMAL | Status: DC
  Administered 2016-06-29 – 2016-07-03 (×3): 100 ug via TRANSDERMAL
  Filled 2016-06-28 (×3): qty 2

## 2016-06-28 MED ORDER — MORPHINE SULFATE (PF) 4 MG/ML IV SOLN
INTRAVENOUS | Status: AC
Start: 2016-06-28 — End: 2016-06-28
  Administered 2016-06-28: 2 mg via INTRAVENOUS
  Filled 2016-06-28: qty 1

## 2016-06-28 MED ORDER — SODIUM CHLORIDE 0.9 % IV SOLN
INTRAVENOUS | Status: DC | PRN
  Administered 2016-06-28: 14 mL

## 2016-06-28 MED ORDER — OXYCODONE HCL 5 MG PO TABS
5.0000 mg | ORAL_TABLET | ORAL | Status: DC | PRN
  Administered 2016-06-28: 10 mg via ORAL

## 2016-06-28 MED ORDER — DEXAMETHASONE SODIUM PHOSPHATE 10 MG/ML IJ SOLN
INTRAMUSCULAR | Status: DC | PRN
  Administered 2016-06-28: 4 mg via INTRAVENOUS

## 2016-06-28 MED ORDER — OXYCODONE HCL 5 MG PO TABS: 5.0000 mg | ORAL_TABLET | Freq: Four times a day (QID) | ORAL | 0 refills | Status: DC | PRN

## 2016-06-28 MED ORDER — MIDAZOLAM HCL 2 MG/2ML IJ SOLN
INTRAMUSCULAR | Status: AC
  Filled 2016-06-28: qty 2

## 2016-06-28 MED ORDER — SODIUM CHLORIDE 0.9% FLUSH: 3.0000 mL | INTRAVENOUS | Status: DC | PRN

## 2016-06-28 MED ORDER — ONDANSETRON HCL 4 MG/2ML IJ SOLN
INTRAMUSCULAR | Status: DC | PRN
  Administered 2016-06-28: 4 mg via INTRAVENOUS

## 2016-06-28 MED ORDER — ACETAMINOPHEN 325 MG PO TABS: 650.0000 mg | ORAL_TABLET | Freq: Four times a day (QID) | ORAL | Status: DC | PRN

## 2016-06-28 MED ORDER — LACTATED RINGERS IV SOLN
INTRAVENOUS | Status: DC | PRN
  Administered 2016-06-28: 08:00:00 via INTRAVENOUS

## 2016-06-28 MED ORDER — SIMETHICONE 80 MG PO CHEW
40.0000 mg | CHEWABLE_TABLET | Freq: Four times a day (QID) | ORAL | Status: DC | PRN
  Filled 2016-06-28: qty 1

## 2016-06-28 MED ORDER — SODIUM CHLORIDE 0.9 % IR SOLN
Status: DC | PRN
  Administered 2016-06-28: 1000 mL

## 2016-06-28 MED ORDER — MORPHINE SULFATE (PF) 4 MG/ML IV SOLN
2.0000 mg | INTRAVENOUS | Status: AC | PRN
  Administered 2016-06-28 (×5): 2 mg via INTRAVENOUS

## 2016-06-28 MED ORDER — ACETAMINOPHEN 650 MG RE SUPP: 650.0000 mg | Freq: Four times a day (QID) | RECTAL | Status: DC | PRN

## 2016-06-28 MED ORDER — ROCURONIUM BROMIDE 10 MG/ML (PF) SYRINGE
PREFILLED_SYRINGE | INTRAVENOUS | Status: DC | PRN
  Administered 2016-06-28: 40 mg via INTRAVENOUS
  Administered 2016-06-28: 10 mg via INTRAVENOUS

## 2016-06-28 MED ORDER — HEMOSTATIC AGENTS (NO CHARGE) OPTIME
TOPICAL | Status: DC | PRN
  Administered 2016-06-28: 2 via TOPICAL

## 2016-06-28 MED ORDER — LIDOCAINE 2% (20 MG/ML) 5 ML SYRINGE
INTRAMUSCULAR | Status: AC
  Filled 2016-06-28: qty 5

## 2016-06-28 MED ORDER — MORPHINE SULFATE (PF) 4 MG/ML IV SOLN
INTRAVENOUS | Status: AC
  Filled 2016-06-28: qty 1

## 2016-06-28 MED ORDER — ONDANSETRON HCL 4 MG/2ML IJ SOLN
4.0000 mg | Freq: Four times a day (QID) | INTRAMUSCULAR | Status: DC | PRN
  Administered 2016-06-28 – 2016-06-29 (×2): 4 mg via INTRAVENOUS
  Filled 2016-06-28 (×2): qty 2

## 2016-06-28 MED ORDER — 0.9 % SODIUM CHLORIDE (POUR BTL) OPTIME
TOPICAL | Status: DC | PRN
  Administered 2016-06-28: 1000 mL

## 2016-06-28 MED ORDER — ROPINIROLE HCL 1 MG PO TABS
1.0000 mg | ORAL_TABLET | Freq: Every day | ORAL | Status: DC
  Administered 2016-06-28: 1 mg via ORAL
  Filled 2016-06-28 (×2): qty 1

## 2016-06-28 MED ORDER — SUGAMMADEX SODIUM 200 MG/2ML IV SOLN
INTRAVENOUS | Status: AC
  Filled 2016-06-28: qty 2

## 2016-06-28 MED ORDER — PROMETHAZINE HCL 25 MG/ML IJ SOLN
6.2500 mg | INTRAMUSCULAR | Status: DC | PRN
  Administered 2016-06-28: 6.25 mg via INTRAVENOUS

## 2016-06-28 MED ORDER — IRBESARTAN 75 MG PO TABS
75.0000 mg | ORAL_TABLET | Freq: Every day | ORAL | Status: DC
Start: 2016-06-28 — End: 2016-07-03
  Administered 2016-06-29 – 2016-07-03 (×4): 75 mg via ORAL
  Filled 2016-06-28 (×4): qty 1

## 2016-06-28 MED ORDER — MORPHINE SULFATE (PF) 4 MG/ML IV SOLN
INTRAVENOUS | Status: AC
  Administered 2016-06-28: 2 mg via INTRAVENOUS
  Filled 2016-06-28: qty 1

## 2016-06-28 MED ORDER — PROPOFOL 10 MG/ML IV BOLUS
INTRAVENOUS | Status: AC
  Filled 2016-06-28: qty 20

## 2016-06-28 MED ORDER — HYDROMORPHONE HCL 1 MG/ML IJ SOLN
1.0000 mg | INTRAMUSCULAR | Status: DC | PRN
  Administered 2016-06-28 (×4): 1 mg via INTRAVENOUS
  Filled 2016-06-28 (×3): qty 1

## 2016-06-28 MED ORDER — EPHEDRINE SULFATE 50 MG/ML IJ SOLN
INTRAMUSCULAR | Status: DC | PRN
  Administered 2016-06-28: 10 mg via INTRAVENOUS

## 2016-06-28 MED ORDER — ROCURONIUM BROMIDE 10 MG/ML (PF) SYRINGE
PREFILLED_SYRINGE | INTRAVENOUS | Status: AC
  Filled 2016-06-28: qty 10

## 2016-06-28 MED ORDER — PROMETHAZINE HCL 25 MG/ML IJ SOLN
INTRAMUSCULAR | Status: AC
Start: 2016-06-28 — End: 2016-06-28
  Filled 2016-06-28: qty 1

## 2016-06-28 MED ORDER — CIPROFLOXACIN IN D5W 400 MG/200ML IV SOLN
INTRAVENOUS | Status: AC
  Filled 2016-06-28: qty 200

## 2016-06-28 MED ORDER — FENTANYL CITRATE (PF) 100 MCG/2ML IJ SOLN
INTRAMUSCULAR | Status: DC | PRN
  Administered 2016-06-28: 100 ug via INTRAVENOUS
  Administered 2016-06-28: 50 ug via INTRAVENOUS
  Administered 2016-06-28: 100 ug via INTRAVENOUS
  Administered 2016-06-28: 50 ug via INTRAVENOUS

## 2016-06-28 MED ORDER — ONDANSETRON HCL 4 MG/2ML IJ SOLN
INTRAMUSCULAR | Status: AC
  Filled 2016-06-28: qty 2

## 2016-06-28 SURGICAL SUPPLY — 46 items
ADH SKN CLS APL DERMABOND .7 (GAUZE/BANDAGES/DRESSINGS) ×1
APPLIER CLIP 5 13 M/L LIGAMAX5 (MISCELLANEOUS) ×2
APR CLP MED LRG 5 ANG JAW (MISCELLANEOUS) ×1
BAG SPEC RTRVL 10 TROC 200 (ENDOMECHANICALS) ×1
BLADE SURG ROTATE 9660 (MISCELLANEOUS) IMPLANT
CANISTER SUCTION 2500CC (MISCELLANEOUS) ×2 IMPLANT
CHLORAPREP W/TINT 26ML (MISCELLANEOUS) ×2 IMPLANT
CLIP APPLIE 5 13 M/L LIGAMAX5 (MISCELLANEOUS) ×1 IMPLANT
COVER MAYO STAND STRL (DRAPES) ×2 IMPLANT
COVER SURGICAL LIGHT HANDLE (MISCELLANEOUS) ×2 IMPLANT
DERMABOND ADVANCED (GAUZE/BANDAGES/DRESSINGS) ×1
DERMABOND ADVANCED .7 DNX12 (GAUZE/BANDAGES/DRESSINGS) ×1 IMPLANT
DEVICE TROCAR PUNCTURE CLOSURE (ENDOMECHANICALS) ×2 IMPLANT
DRAPE C-ARM 42X72 X-RAY (DRAPES) ×2 IMPLANT
ELECT REM PT RETURN 9FT ADLT (ELECTROSURGICAL) ×2
ELECTRODE REM PT RTRN 9FT ADLT (ELECTROSURGICAL) ×1 IMPLANT
GLOVE BIO SURGEON STRL SZ7 (GLOVE) ×2 IMPLANT
GLOVE BIOGEL PI IND STRL 7.0 (GLOVE) IMPLANT
GLOVE BIOGEL PI IND STRL 7.5 (GLOVE) ×1 IMPLANT
GLOVE BIOGEL PI INDICATOR 7.0 (GLOVE) ×2
GLOVE BIOGEL PI INDICATOR 7.5 (GLOVE) ×1
GLOVE ECLIPSE 7.0 STRL STRAW (GLOVE) ×2 IMPLANT
GLOVE SURG SS PI 6.5 STRL IVOR (GLOVE) ×1 IMPLANT
GOWN STRL REUS W/ TWL LRG LVL3 (GOWN DISPOSABLE) ×3 IMPLANT
GOWN STRL REUS W/TWL LRG LVL3 (GOWN DISPOSABLE) ×6
HEMOSTAT SNOW SURGICEL 2X4 (HEMOSTASIS) ×2 IMPLANT
KIT BASIN OR (CUSTOM PROCEDURE TRAY) ×2 IMPLANT
KIT ROOM TURNOVER OR (KITS) ×2 IMPLANT
NS IRRIG 1000ML POUR BTL (IV SOLUTION) ×2 IMPLANT
PAD ARMBOARD 7.5X6 YLW CONV (MISCELLANEOUS) ×2 IMPLANT
POUCH RETRIEVAL ECOSAC 10 (ENDOMECHANICALS) ×1 IMPLANT
POUCH RETRIEVAL ECOSAC 10MM (ENDOMECHANICALS) ×1
SCISSORS LAP 5X35 DISP (ENDOMECHANICALS) ×2 IMPLANT
SET CHOLANGIOGRAPH 5 50 .035 (SET/KITS/TRAYS/PACK) ×2 IMPLANT
SET IRRIG TUBING LAPAROSCOPIC (IRRIGATION / IRRIGATOR) ×2 IMPLANT
SLEEVE ENDOPATH XCEL 5M (ENDOMECHANICALS) ×5 IMPLANT
SPECIMEN JAR SMALL (MISCELLANEOUS) ×2 IMPLANT
STRIP CLOSURE SKIN 1/2X4 (GAUZE/BANDAGES/DRESSINGS) ×2 IMPLANT
SUT MNCRL AB 4-0 PS2 18 (SUTURE) ×4 IMPLANT
SUT VICRYL 0 UR6 27IN ABS (SUTURE) ×2 IMPLANT
TOWEL OR 17X24 6PK STRL BLUE (TOWEL DISPOSABLE) ×2 IMPLANT
TOWEL OR 17X26 10 PK STRL BLUE (TOWEL DISPOSABLE) ×2 IMPLANT
TRAY LAPAROSCOPIC MC (CUSTOM PROCEDURE TRAY) ×2 IMPLANT
TROCAR XCEL BLUNT TIP 100MML (ENDOMECHANICALS) ×2 IMPLANT
TROCAR XCEL NON-BLD 5MMX100MML (ENDOMECHANICALS) ×2 IMPLANT
TUBING INSUFFLATION (TUBING) ×2 IMPLANT

## 2016-06-28 NOTE — Discharge Instructions (Signed)
[facility] LAPAROSCOPIC SURGERY: POST OP INSTRUCTIONS  Always review your discharge instruction sheet given to you by the facility where your surgery was performed. IF YOU HAVE DISABILITY OR FAMILY LEAVE FORMS, YOU MUST BRING THEM TO THE OFFICE FOR PROCESSING.   DO NOT GIVE THEM TO YOUR DOCTOR.  1. A prescription for pain medication may be given to you upon discharge.  Take your pain medication as prescribed, if needed.  If narcotic pain medicine is not needed, then you may take acetaminophen (Tylenol), naprosyn (Alleve), or ibuprofen (Advil) as needed. 2. Take your usually prescribed medications unless otherwise directed. 3. If you need a refill on your pain medication, please contact your pharmacy.  They will contact our office to request authorization. Prescriptions will not be filled after 5pm or on week-ends. 4. You should follow a light diet the first few days after arrival home, such as soup and crackers, etc.  Be sure to include lots of fluids daily. 5. Most patients will experience some swelling and bruising in the area of the incisions.  Ice packs will help.  Swelling and bruising can take several days to resolve.  6. It is common to experience some constipation if taking pain medication after surgery.  Increasing fluid intake and taking a stool softener (such as Colace) will usually help or prevent this problem from occurring.  A mild laxative (Milk of Magnesia or Miralax) should be taken according to package instructions if there are no bowel movements after 48 hours. 7. Unless discharge instructions indicate otherwise, you may remove your bandages 48 hours after surgery, and you may shower at that time.  You may have steri-strips (small skin tapes) in place directly over the incision.  These strips should be left on the skin for 7-10 days.  If your surgeon used skin glue on the incision, you may shower in 24 hours.  The glue will flake  off over the next 2-3 weeks.  Any sutures or staples will be removed at the office during your follow-up visit. 8. ACTIVITIES:  You may resume regular (light) daily activities beginning the next day--such as daily self-care, walking, climbing stairs--gradually increasing activities as tolerated.  You may have sexual intercourse when it is comfortable.  Refrain from any heavy lifting or straining until approved by your doctor. a. You may drive when you are no longer taking prescription pain medication, you can comfortably wear a seatbelt, and you can safely maneuver your car and apply brakes. b. RETURN TO WORK:  __________________________________________________________ 9. You should see your doctor in the office for a follow-up appointment approximately 2-3 weeks after your surgery.  Make sure that you call for this appointment within a day or two after you arrive home to insure a convenient appointment time. 10. OTHER INSTRUCTIONS: __________________________________________________________________________________________________________________________ __________________________________________________________________________________________________________________________ WHEN TO CALL YOUR DOCTOR: 1. Fever over 101.0 2. Inability to urinate 3. Continued bleeding from incision. 4. Increased pain, redness, or drainage from the incision. 5. Increasing abdominal pain  The clinic staff is available to answer your questions during regular business hours.  Please don't hesitate to call and ask to speak to one of the nurses for clinical concerns.  If you have a medical emergency, go to the nearest emergency room or call 911.  A surgeon from [facility] is always on call at the hospital. [address], [address], [place], [place]  [address] ? [address] [address], [place], [place]   [address] [phone] ? [phone] ? [phone] Web site: [email or web address]  

## 2016-06-28 NOTE — Progress Notes (Signed)
Pt very restless; agitated; c/o severe pain unrelieved by dilaudid. Anesthesia aware. Morphine ordered.

## 2016-06-28 NOTE — Progress Notes (Signed)
Pt still very restless & agitated. C/o abdominal pain. Anesthesia notified. Versed ordered.

## 2016-06-28 NOTE — Anesthesia Procedure Notes (Signed)
Procedure Name: Intubation Date/Time: 06/28/2016 8:28 AM Performed by: Melina Copa, Sapir Lavey R Pre-anesthesia Checklist: Patient identified, Emergency Drugs available, Suction available and Patient being monitored Patient Re-evaluated:Patient Re-evaluated prior to inductionOxygen Delivery Method: Circle System Utilized Preoxygenation: Pre-oxygenation with 100% oxygen Intubation Type: IV induction Ventilation: Mask ventilation without difficulty Laryngoscope Size: Mac and 3 Grade View: Grade I Tube type: Oral Tube size: 7.5 mm Number of attempts: 1 Airway Equipment and Method: Stylet and Oral airway Placement Confirmation: ETT inserted through vocal cords under direct vision,  positive ETCO2 and breath sounds checked- equal and bilateral Secured at: 19 cm Tube secured with: Tape Dental Injury: Teeth and Oropharynx as per pre-operative assessment

## 2016-06-28 NOTE — Anesthesia Preprocedure Evaluation (Addendum)
Anesthesia Evaluation  Patient identified by MRN, date of birth, ID band Patient awake    Reviewed: Allergy & Precautions, NPO status , Patient's Chart, lab work & pertinent test results  Airway Mallampati: I  TM Distance: >3 FB Neck ROM: Full    Dental  (+) Edentulous Upper   Pulmonary COPD, former smoker,    breath sounds clear to auscultation       Cardiovascular hypertension, Pt. on medications  Rhythm:Regular Rate:Normal     Neuro/Psych    GI/Hepatic GERD  ,  Endo/Other  diabetes  Renal/GU      Musculoskeletal  (+) Arthritis ,   Abdominal   Peds  Hematology   Anesthesia Other Findings   Reproductive/Obstetrics                            Anesthesia Physical Anesthesia Plan  ASA: III  Anesthesia Plan: General   Post-op Pain Management:    Induction: Intravenous  Airway Management Planned: Oral ETT  Additional Equipment:   Intra-op Plan:   Post-operative Plan: Extubation in OR  Informed Consent: I have reviewed the patients History and Physical, chart, labs and discussed the procedure including the risks, benefits and alternatives for the proposed anesthesia with the patient or authorized representative who has indicated his/her understanding and acceptance.   Dental advisory given  Plan Discussed with: CRNA, Anesthesiologist and Surgeon  Anesthesia Plan Comments:        Anesthesia Quick Evaluation

## 2016-06-28 NOTE — Progress Notes (Signed)
Pt unable to tolerate lying in the bed. Assisted up to recliner. She says this is how she always sleeps/sits at home.

## 2016-06-28 NOTE — H&P (Signed)
70 yof referred by Dr Zenovia Jarred for possible cholecystectomy. [name] has pmh of multiple liver hemangiomas, ibs, back pain, htn who presents with n/v and some abd pain. [name] over past 4-5 years has worsened in terms of severity and frequency. [name] has frequent n/v/d associated with some epigastric pain episodes. [name] has lost over 10 lbs lately. not eating much due to pain. her symptoms are mostly immediate n/v but there is also pain. [name] has prior history of right nephrectomy via right pm incision. [name] also has tah and appy. [name] has undergone multiple tests at this point. [name] has [place] in 2014 that shows no stones, multiple hepatic lesions that end up being likely hemangiomas. 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 [name] has recent mri that is stable since 2012. there are multiple hepatic cysts and hemangiomas. there is biliary dilatation dating back to 2012 and mild diffuse distention of pd. 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 duodenum was nl. [name] did have hida scan previously as well that was normal except [name] did have some pain with ensure. [name] is here to discuss possible cholecystectomy.   Other Problems Shirlean Mylar Gwynn, RMA; 06/07/2016 9:38 AM) Arthritis Back Pain Gastric Ulcer Gastroesophageal Reflux Disease Hemorrhoids High blood pressure Migraine Headache Thyroid Disease  Past Surgical History Yehuda Mao, RMA; 06/07/2016 9:38 AM) Appendectomy Cataract Surgery Bilateral. Foot Surgery Right. Hemorrhoidectomy Hysterectomy (not due to cancer) - Partial Knee Surgery Right. Nephrectomy Right. Spinal Surgery - Lower Back Spinal Surgery Midback  Diagnostic Studies History Yehuda Mao, RMA; 06/07/2016 9:38 AM) Colonoscopy 1-5 years ago Mammogram within last year Pap Smear >5 years ago  Allergies Shirlean Mylar Gwynn, RMA; 06/07/2016 9:39 AM) Penicillin G  Potassium *PENICILLINS*  Medication History (Robin Gwynn, RMA; 06/07/2016 9:41 AM) FentaNYL (100MCG/HR Patch 72HR, Transdermal) Active. Hydrocodone-Acetaminophen (5-325MG  Tablet, Oral) Active. Zolpidem Tartrate (10MG  Tablet, Oral) Active. Atorvastatin Calcium (10MG  Tablet, Oral) Active. BuPROPion HCl ER (SR) (150MG  Tablet ER 12HR, Oral) Active. Fluconazole (100MG  Tablet, Oral) Active. FLUoxetine HCl (20MG  Capsule, Oral) Active. Fluticasone Propionate (50MCG/ACT Suspension, Nasal) Active. Hyoscyamine Sulfate (0.125MG  Tab Sublingual, Sublingual) Active. Levothyroxine Sodium (100MCG Tablet, Oral) Active. Omeprazole (40MG  Capsule DR, Oral) Active. Ondansetron (4MG  Tablet Disint, Oral) Active. Quinapril HCl (40MG  Tablet, Oral) Active. ROPINIRole HCl (1MG  Tablet, Oral) Active. Felodipine ER (5MG  Tablet ER 24HR, Oral) Active. Medications Reconciled  Social History Yehuda Mao, RMA; 06/07/2016 9:38 AM) Alcohol use Moderate alcohol use. No caffeine use No drug use Tobacco use Former smoker.  Family History Yehuda Mao, [place]; 06/07/2016 9:38 AM) Arthritis Family Members In General, Father, Mother. Heart Disease Father. Hypertension Father. Migraine Headache Mother, Sister.  Pregnancy / Birth History Yehuda Mao, RMA; 06/07/2016 9:38 AM) Age at menarche 29 years. Age of menopause [age] Contraceptive History Oral contraceptives. Gravida 3 Length (months) of breastfeeding 3-6 Maternal age 35-20 Para 3  Review of Systems Shirlean Mylar Gwynn RMA; 06/07/2016 9:38 AM) General Present- Appetite Loss, Chills, Fatigue and Weight Loss. Not Present- Fever, Night Sweats and Weight Gain. HEENT Present- Seasonal Allergies and Wears glasses/contact lenses. Not Present- Earache, Hearing Loss, Hoarseness, Nose Bleed, Oral Ulcers, Ringing in the Ears, Sinus Pain, Sore Throat, Visual Disturbances and Yellow Eyes. Respiratory Not Present- Bloody sputum, Chronic Cough,  Difficulty Breathing, Snoring and Wheezing. Breast Not Present- Breast Mass, Breast Pain, Nipple Discharge and Skin Changes. Cardiovascular Present- Leg Cramps. Not Present- Chest Pain, Difficulty Breathing Lying Down, Palpitations, Rapid Heart Rate, Shortness of  Breath and Swelling of Extremities. Gastrointestinal Present- Abdominal Pain, Excessive gas, Indigestion, Nausea and Vomiting. Not Present- Bloating, Bloody Stool, Change in Bowel Habits, Chronic diarrhea, Constipation, Difficulty Swallowing, Gets full quickly at meals, Hemorrhoids and Rectal Pain. Female Genitourinary Present- Frequency. Not Present- Nocturia, Painful Urination, Pelvic Pain and Urgency. Musculoskeletal Present- Back Pain, Joint Pain and Muscle Pain. Not Present- Joint Stiffness, Muscle Weakness and Swelling of Extremities. Neurological Present- Weakness. Not Present- Decreased Memory, Fainting, Headaches, Numbness, Seizures, Tingling, Tremor and Trouble walking. Psychiatric Not Present- Anxiety, Bipolar, Change in Sleep Pattern, Depression, Fearful and Frequent crying. Endocrine Not Present- Cold Intolerance, Excessive Hunger, Hair Changes, Heat Intolerance, Hot flashes and New Diabetes. Hematology Present- Easy Bruising. Not Present- Blood Thinners, Excessive bleeding, Gland problems, HIV and Persistent Infections.  Vitals (Robin Gwynn RMA; 06/07/2016 9:41 AM) 06/07/2016 9:41 AM Weight: 116 lb Height: 66in Body Surface Area: 1.59 m Body Mass Index: 18.72 kg/m  Temp.: 97.86F  Pulse: 82 (Regular)  BP: 146/70 (Sitting, Left Arm, Standard)  Physical Exam Rolm Bookbinder MD; 06/08/2016 4:19 PM) General Mental Status-Alert. Orientation-Oriented X3. Chest and Lung Exam Chest and lung exam reveals -on auscultation, normal breath sounds, no adventitious sounds and normal vocal resonance. Cardiovascular Cardiovascular examination reveals -normal heart sounds, regular rate and rhythm with no  murmurs. Abdomen Note: tender epigastrium, soft nondistended    Assessment & Plan Rolm Bookbinder MD; 06/08/2016 4:22 PM) RIGHT UPPER QUADRANT PAIN (R10.11) Story: Im not sure of etiology of her abdominal pain but it has become quite debilitating. I suppose there is some chance this could be gb with nl tests for most part. [name] doesnt have another apparent etiology. I think reasonable to consider lap chole although this will be technically difficult given prior right nephrectomy and location of incision. I think prior to considering lap chole though I have discussed with Dr Hilarie Fredrickson obtaining a gastric empyting study as [name] certainly could have some gastroparesis as well. I also asked him given biliary ductal findings if ercp would be indicated. he is going to consider and let me know. Pending this discussion will consider lap chole as discussed with patient. [name] and her husband are well aware that surgery will carry some risk and might not address the problem. Addendum: empyting study normal, proceed with lap chole

## 2016-06-28 NOTE — Progress Notes (Signed)
Patient arrived from PACU in recliner, immediately had to use the bathroom. Patient was able to stand with help to use the bathroom and void. Patient was moaning in pain but shortly after adjusting to recliner, fell asleep. Vitals stable, report received from PACU nurse via telephone. Arrived to unit with IV fluids infusing and dentures in mouth. No complaints at this time. Family at bedside.

## 2016-06-28 NOTE — Interval H&P Note (Signed)
History and Physical Interval Note:  06/28/2016 7:58 AM  [name]  has presented today for surgery, with the diagnosis of GALLBLADDER DISEASE  The various methods of treatment have been discussed with the patient and family. After consideration of risks, benefits and other options for treatment, the patient has consented to  Procedure(s): LAPAROSCOPIC CHOLECYSTECTOMY POSSIBLE OPEN WITH POSSIBLE INTRAOPERATIVE CHOLANGIOGRAM (N/A) as a surgical intervention .  The patient's history has been reviewed, patient examined, no change in status, stable for surgery.  I have reviewed the patient's chart and labs.  Questions were answered to the patient's satisfaction.     Artez Regis

## 2016-06-28 NOTE — Progress Notes (Signed)
Pt's daughter at bedside. Agreeable to keeping patient overnight. Dr. Donne Hazel contacted for admission order

## 2016-06-28 NOTE — Transfer of Care (Signed)
Immediate Anesthesia Transfer of Care Note  Patient: [name]  Procedure(s) Performed: Procedure(s): LAPAROSCOPIC CHOLECYSTECTOMY  WITH  INTRAOPERATIVE CHOLANGIOGRAM (N/A)  Patient Location: PACU  Anesthesia Type:General  Level of Consciousness: awake, oriented and patient cooperative  Airway & Oxygen Therapy: Patient Spontanous Breathing and Patient connected to nasal cannula oxygen  Post-op Assessment: Report given to RN, Post -op Vital signs reviewed and stable and Patient moving all extremities  Post vital signs: Reviewed and stable  Last Vitals:  Vitals:   06/28/16 0733  BP: 132/68  Pulse: 72  Resp: 18  Temp: 37.1 C    Last Pain:  Vitals:   06/28/16 0733  TempSrc: Oral         Complications: No apparent anesthesia complications

## 2016-06-28 NOTE — Op Note (Signed)
Preoperative diagnosis: right upper quadrant pain Postoperative diagnosis: chronic cholecystitis Procedure: laparoscopic cholecystectomy with cholangiogram Surgeon: Dr Serita Grammes Asst: Cedric Fishman Anesthesia: general EBL: minimal Drains none Specimen gb and contents to pathology Complications: none Sponge count correct at completion Disposition to recovery stable Indications: This is a 77 yof with abdominal pain and negative evaluation at this point but still continues with pain. We discussed options and have discussed laparoscopic cholecystectomy.  Procedure: After informed consent was obtained the patient was taken to the operating room. She was given antibiotics. Sequential compression devices were on her legs. She was placed under general anesthesia without complication. Her abdomen was prepped and draped in the standard sterile surgical fashion. A surgical timeout was then performed.  I infiltrated marcaine below the umbilicus. I made a vertical incision and grasped the fascia. I then incised the fascia and entered the peritoneum bluntly.I placed a 0 vicryl pursestring suture and inserted a hasson trocar. I then insufflated the abdomen to 15 mm Hg pressure. I then placed a 5 mm epigastric trocar.  I had to place a left sided 5 mm trocar to lyse adhesions. I spent about 20 minutes lysing adhesions with combination of blunt and sharp dissection.  There were no injuries noted. These were all omental adhesions. Her gallbladder was actually attached to her abdominal wall in this scar tissue.  It did not appear normal and had inflammation to it and appeared like chronic cholecystitis.  I then was able to grasp the gallbladder and retract it cephalad and lateral. She had scar tissue all around the gallbladder and down in the triangle. I then obtained the critical view of safety. I clipped and divided the cystic artery.  I placed a clip distally on the duct.  I then made a ductotomy. I  inserted a cholangiogram catheter.  I then did a cholangiogram. I was clearly in the cystic duct.  I could only get a small portion to appear to fill proximally despite multiple attempts, backing up catheter and putting patient head down.  The distal portion is very dilated and the duodenum did fill.  I was positive I was in cystic duct and I think contrast was just flowing preferentially down the dilated duct.  I treated the cystic duct in a similar fashion with clips. The clips traversed the duct. The duct appeared viable. I then removed the gallbladder from the liver bed and placed it in a bag.  I then obtained hemostasis and irrigated. I did place surgicel snow in the bed as it was friable and there was small bleeding vessel that stopped near duct. I removed the hasson trocar and tied the pursestring down. I placed two additional two 0 vicryl stitches at the umbilical incision with the endoclose device.  I then desufflated the abdomen and removed all my remaining trocars. I then closed these with 4-0 Monocryl and Dermabond. She tolerated this well was extubated and transferred to the recovery room in stable condition

## 2016-06-28 NOTE — Anesthesia Postprocedure Evaluation (Signed)
Anesthesia Post Note  Patient: [name]  Procedure(s) Performed: Procedure(s) (LRB): LAPAROSCOPIC CHOLECYSTECTOMY  WITH  INTRAOPERATIVE CHOLANGIOGRAM (N/A)  Patient location during evaluation: PACU Anesthesia Type: General Level of consciousness: awake and alert Pain management: pain level not controlled (her pain very difficult to control) Vital Signs Assessment: post-procedure vital signs reviewed and stable Respiratory status: spontaneous breathing, nonlabored ventilation, respiratory function stable and patient connected to nasal cannula oxygen Cardiovascular status: blood pressure returned to baseline and stable Postop Assessment: no signs of nausea or vomiting Anesthetic complications: no    Last Vitals:  Vitals:   06/28/16 1330 06/28/16 1345  BP: (!) 118/99   Pulse: 73 74  Resp: 19 (!) 21  Temp:      Last Pain:  Vitals:   06/28/16 1330  TempSrc:   PainSc: Asleep                 Ashon Rosenberg,JAMES TERRILL

## 2016-06-29 ENCOUNTER — Encounter (HOSPITAL_COMMUNITY): Payer: Self-pay | Admitting: General Surgery

## 2016-06-29 DIAGNOSIS — K219 Gastro-esophageal reflux disease without esophagitis: Secondary | ICD-10-CM | POA: Diagnosis present

## 2016-06-29 DIAGNOSIS — Z87891 Personal history of nicotine dependence: Secondary | ICD-10-CM | POA: Diagnosis not present

## 2016-06-29 DIAGNOSIS — I10 Essential (primary) hypertension: Secondary | ICD-10-CM | POA: Diagnosis present

## 2016-06-29 DIAGNOSIS — Z681 Body mass index (BMI) 19 or less, adult: Secondary | ICD-10-CM | POA: Diagnosis not present

## 2016-06-29 DIAGNOSIS — Z905 Acquired absence of kidney: Secondary | ICD-10-CM | POA: Diagnosis not present

## 2016-06-29 DIAGNOSIS — E079 Disorder of thyroid, unspecified: Secondary | ICD-10-CM | POA: Diagnosis present

## 2016-06-29 DIAGNOSIS — I493 Ventricular premature depolarization: Secondary | ICD-10-CM | POA: Diagnosis not present

## 2016-06-29 DIAGNOSIS — G8918 Other acute postprocedural pain: Secondary | ICD-10-CM | POA: Diagnosis not present

## 2016-06-29 DIAGNOSIS — M1990 Unspecified osteoarthritis, unspecified site: Secondary | ICD-10-CM | POA: Diagnosis present

## 2016-06-29 DIAGNOSIS — K567 Ileus, unspecified: Secondary | ICD-10-CM | POA: Diagnosis not present

## 2016-06-29 DIAGNOSIS — E876 Hypokalemia: Secondary | ICD-10-CM | POA: Diagnosis not present

## 2016-06-29 DIAGNOSIS — Z880 Allergy status to penicillin: Secondary | ICD-10-CM | POA: Diagnosis not present

## 2016-06-29 DIAGNOSIS — K811 Chronic cholecystitis: Secondary | ICD-10-CM | POA: Diagnosis present

## 2016-06-29 DIAGNOSIS — E860 Dehydration: Secondary | ICD-10-CM | POA: Diagnosis not present

## 2016-06-29 DIAGNOSIS — D1803 Hemangioma of intra-abdominal structures: Secondary | ICD-10-CM | POA: Diagnosis present

## 2016-06-29 DIAGNOSIS — Z01812 Encounter for preprocedural laboratory examination: Secondary | ICD-10-CM | POA: Diagnosis not present

## 2016-06-29 DIAGNOSIS — K660 Peritoneal adhesions (postprocedural) (postinfection): Secondary | ICD-10-CM | POA: Diagnosis present

## 2016-06-29 DIAGNOSIS — I472 Ventricular tachycardia: Secondary | ICD-10-CM | POA: Diagnosis not present

## 2016-06-29 DIAGNOSIS — R1011 Right upper quadrant pain: Secondary | ICD-10-CM | POA: Diagnosis not present

## 2016-06-29 DIAGNOSIS — Z8711 Personal history of peptic ulcer disease: Secondary | ICD-10-CM | POA: Diagnosis not present

## 2016-06-29 DIAGNOSIS — E43 Unspecified severe protein-calorie malnutrition: Secondary | ICD-10-CM | POA: Diagnosis present

## 2016-06-29 DIAGNOSIS — K589 Irritable bowel syndrome without diarrhea: Secondary | ICD-10-CM | POA: Diagnosis present

## 2016-06-29 DIAGNOSIS — Z9049 Acquired absence of other specified parts of digestive tract: Secondary | ICD-10-CM | POA: Diagnosis not present

## 2016-06-29 DIAGNOSIS — J449 Chronic obstructive pulmonary disease, unspecified: Secondary | ICD-10-CM | POA: Diagnosis present

## 2016-06-29 LAB — COMPREHENSIVE METABOLIC PANEL
ALT: 25 U/L (ref 14–54)
AST: 35 U/L (ref 15–41)
Albumin: 4 g/dL (ref 3.5–5.0)
Alkaline Phosphatase: 74 U/L (ref 38–126)
Anion gap: 12 (ref 5–15)
BUN: 10 mg/dL (ref 6–20)
CO2: 26 mmol/L (ref 22–32)
Calcium: 10.1 mg/dL (ref 8.9–10.3)
Chloride: 95 mmol/L — ABNORMAL LOW (ref 101–111)
Creatinine, Ser: 0.78 mg/dL (ref 0.44–1.00)
GFR calc Af Amer: >60 mL/min (ref >60)
GFR calc non Af Amer: >60 mL/min (ref >60)
Glucose, Bld: 116 mg/dL — ABNORMAL HIGH (ref 65–99)
Potassium: 3.3 mmol/L — ABNORMAL LOW (ref 3.5–5.1)
Sodium: 133 mmol/L — ABNORMAL LOW (ref 135–145)
Total Bilirubin: 1.8 mg/dL — ABNORMAL HIGH (ref 0.3–1.2)
Total Protein: 7 g/dL (ref 6.5–8.1)

## 2016-06-29 LAB — CBC
HCT: 41.4 % (ref 36.0–46.0)
Hemoglobin: 14.2 g/dL (ref 12.0–15.0)
MCH: 32.4 pg (ref 26.0–34.0)
MCHC: 34.3 g/dL (ref 30.0–36.0)
MCV: 94.5 fL (ref 78.0–100.0)
Platelets: 327 10*3/uL (ref 150–400)
RBC: 4.38 MIL/uL (ref 3.87–5.11)
RDW: 11.9 % (ref 11.5–15.5)
WBC: 13.8 10*3/uL — ABNORMAL HIGH (ref 4.0–10.5)

## 2016-06-29 LAB — TROPONIN I: Troponin I: <0.03 ng/mL (ref <0.03)

## 2016-06-29 LAB — LIPASE, BLOOD: Lipase: 24 U/L (ref 11–51)

## 2016-06-29 MED ORDER — KETOROLAC TROMETHAMINE 15 MG/ML IJ SOLN
15.0000 mg | Freq: Once | INTRAMUSCULAR | Status: AC
  Administered 2016-06-29: 15 mg via INTRAVENOUS
  Filled 2016-06-29: qty 1

## 2016-06-29 MED ORDER — FAMOTIDINE IN NACL 20-0.9 MG/50ML-% IV SOLN
20.0000 mg | Freq: Two times a day (BID) | INTRAVENOUS | Status: DC
  Administered 2016-06-29 – 2016-07-02 (×7): 20 mg via INTRAVENOUS
  Filled 2016-06-29 (×8): qty 50

## 2016-06-29 MED ORDER — HYDROMORPHONE HCL 1 MG/ML IJ SOLN
1.0000 mg | INTRAMUSCULAR | Status: DC | PRN
  Administered 2016-06-29 (×5): 1 mg via INTRAVENOUS
  Filled 2016-06-29 (×5): qty 1

## 2016-06-29 MED ORDER — POTASSIUM CHLORIDE IN NACL 20-0.9 MEQ/L-% IV SOLN
INTRAVENOUS | Status: DC
  Administered 2016-06-29: 17:00:00 via INTRAVENOUS
  Administered 2016-06-30: 1 mL via INTRAVENOUS
  Filled 2016-06-29 (×2): qty 1000

## 2016-06-29 MED ORDER — ENOXAPARIN SODIUM 40 MG/0.4ML ~~LOC~~ SOLN
40.0000 mg | SUBCUTANEOUS | Status: DC
  Administered 2016-06-29 – 2016-07-02 (×4): 40 mg via SUBCUTANEOUS
  Filled 2016-06-29 (×4): qty 0.4

## 2016-06-29 MED ORDER — ONDANSETRON HCL 4 MG/2ML IJ SOLN
4.0000 mg | Freq: Four times a day (QID) | INTRAMUSCULAR | Status: DC
  Administered 2016-06-29 – 2016-06-30 (×4): 4 mg via INTRAVENOUS
  Filled 2016-06-29 (×4): qty 2

## 2016-06-29 MED ORDER — FENTANYL CITRATE (PF) 100 MCG/2ML IJ SOLN
12.5000 ug | INTRAMUSCULAR | Status: DC | PRN
  Administered 2016-06-29 – 2016-07-03 (×17): 12.5 ug via INTRAVENOUS
  Filled 2016-06-29 (×17): qty 2

## 2016-06-29 MED ORDER — PROMETHAZINE HCL 25 MG/ML IJ SOLN
6.2500 mg | Freq: Four times a day (QID) | INTRAMUSCULAR | Status: DC | PRN
  Administered 2016-06-29 – 2016-06-30 (×3): 6.25 mg via INTRAVENOUS
  Filled 2016-06-29 (×3): qty 1

## 2016-06-29 MED ORDER — PROMETHAZINE HCL 25 MG/ML IJ SOLN
6.2500 mg | Freq: Once | INTRAMUSCULAR | Status: AC
  Administered 2016-06-29: 6.25 mg via INTRAVENOUS
  Filled 2016-06-29: qty 1

## 2016-06-29 NOTE — Progress Notes (Signed)
1 Day Post-Op  Subjective: Pain better this am, she has hurt since pacu this is not new, some nausea, not really up much  Objective: Vital signs in last 24 hours: Temp:  [97.9 F (36.6 C)-98.8 F (37.1 C)] 98.3 F (36.8 C) (11/09 0609) Pulse Rate:  [69-84] 82 (11/09 0609) Resp:  [16-23] 18 (11/09 0609) BP: (118-173)/(61-126) 139/66 (11/09 0609) SpO2:  [98 %-100 %] 98 % (11/09 0609) Last BM Date: 06/27/16  Intake/Output from previous day: 11/08 0701 - 11/09 0700 In: 1381.7 [P.O.:120; I.V.:1261.7] Out: 2530 [Urine:2500; Blood:30] Intake/Output this shift: Total I/O In: -  Out: 250 [Urine:250]  Uncomfortable lying in bed abd soft approp tender incisions all clean   Lab Results:   Recent Labs  06/27/16 1128  WBC 5.5  HGB 12.2  HCT 37.1  PLT 283   BMET  Recent Labs  06/27/16 1128  NA 136  K 4.8  CL 103  CO2 25  GLUCOSE 97  BUN 13  CREATININE 1.01*  CALCIUM 9.7   PT/INR No results for input(s): LABPROT, INR in the last 72 hours. ABG No results for input(s): PHART, HCO3 in the last 72 hours.  Invalid input(s): PCO2, PO2  Studies/Results: Dg Cholangiogram Operative  Result Date: 06/28/2016 CLINICAL DATA:  Laparoscopic cholecystectomy. EXAM: INTRAOPERATIVE CHOLANGIOGRAM TECHNIQUE: Cholangiographic images from the C-arm fluoroscopic device were submitted for interpretation post-operatively. Please see the procedural report for the amount of contrast and the fluoroscopy time utilized. COMPARISON:  MRCP 04/29/2016 FINDINGS: Again noted is dilatation of the common bile duct. Contrast drains into the duodenum. Cannot exclude a stricture at the ampulla. No definite stones. Surgical changes in lumbar spine. IMPRESSION: Biliary system is patent.  No definite stones. Chronic dilatation of the common bile duct. Findings could be related to an overlying stricture but nonspecific. Electronically Signed   By: Markus Daft M.D.   On: 06/28/2016 09:59     Anti-infectives: Anti-infectives    Start     Dose/Rate Route Frequency Ordered Stop   06/28/16 0755  ciprofloxacin (CIPRO) 400 MG/200ML IVPB  Status:  Discontinued    Comments:  Claybon Jabs   : cabinet override      06/28/16 0755 06/28/16 0804   06/28/16 0607  ciprofloxacin (CIPRO) IVPB 400 mg     400 mg 200 mL/hr over 60 Minutes Intravenous On call to O.R. 06/28/16 0607 06/28/16 0818      Assessment/Plan: POD 1 lap chole  1. Will continue oral narcotics, iv backup, will give dose of toradol to see if that helps also 2. Can advance diet when ready, will give zofran scheduled for 24 hours. 3. Not ready for dc today 4. Pain has been present since or and I dont think this is anything more than postop pain, she had a lot of loa that occurred with this and was not standard gb 5. Lovenox, scds   [facility] 06/29/2016

## 2016-06-29 NOTE — Care Management Obs Status (Signed)
[facility] NOTIFICATION   Patient Details  Name: [name] MRN: [id number] Date of Birth: 11-03-1945   Medicare Observation Status Notification Given:  Yes    Marilu Favre, RN 06/29/2016, 9:55 AM

## 2016-06-29 NOTE — Progress Notes (Signed)
1 Day Post-Op  Subjective: Ask to see.  Complaining of chest pain, similar to her GERD pain.  Also complaining of nausea and vomiting with clear liquids.  Lying in chair groaning and complaining of abdominal pain.  She has chronic pain abdomen and back on Fentanyl and Oxycodone at home for this.  current meds:  Wellbutrin 150 daily, Fentanyl patch 100, 5 mg of dilaudid today, Toradol 15 mg today, phenergan 6.25 today  Objective: Vital signs in last 24 hours: Temp:  [98.3 F (36.8 C)-99 F (37.2 C)] 99 F (37.2 C) (11/09 1343) Pulse Rate:  [76-82] 77 (11/09 1343) Resp:  [16-18] 18 (11/09 1343) BP: (139-171)/(65-70) 171/65 (11/09 1343) SpO2:  [96 %-98 %] 96 % (11/09 1343) Last BM Date: 06/27/16 120 PO 1400IV  2500 urine Afebrile, VSS WBC up this PM/ H/H is up IOC negative EKG SR, with PVC's no acute changes noted.  No SOB Intake/Output from previous day: 11/08 0701 - 11/09 0700 In: 1381.7 [P.O.:120; I.V.:1261.7] Out: 2530 [Urine:2500; Blood:30] Intake/Output this shift: Total I/O In: 20 [P.O.:20] Out: 600 [Urine:600]  General appearance: alert and mild distress Resp: clear to auscultation bilaterally Cardio: regular rate and rhythm, S1, S2 normal, no murmur, click, rub or gallop GI: complaining of abdominal pain, no BS, not distended, some ecchymosis at both the umbilical and right lateral port sites  Lab Results:   Recent Labs  06/27/16 1128 06/29/16 1415  WBC 5.5 13.8*  HGB 12.2 14.2  HCT 37.1 41.4  PLT 283 327    BMET  Recent Labs  06/27/16 1128  NA 136  K 4.8  CL 103  CO2 25  GLUCOSE 97  BUN 13  CREATININE 1.01*  CALCIUM 9.7   PT/INR No results for input(s): LABPROT, INR in the last 72 hours.   Recent Labs Lab 06/27/16 1128  AST 15  ALT 12*  ALKPHOS 48  BILITOT 0.5  PROT 6.2*  ALBUMIN 3.9     Lipase     Component Value Date/Time   LIPASE 24.0 04/26/2016 1046     Studies/Results: Dg Cholangiogram Operative  Result Date:  06/28/2016 CLINICAL DATA:  Laparoscopic cholecystectomy. EXAM: INTRAOPERATIVE CHOLANGIOGRAM TECHNIQUE: Cholangiographic images from the C-arm fluoroscopic device were submitted for interpretation post-operatively. Please see the procedural report for the amount of contrast and the fluoroscopy time utilized. COMPARISON:  MRCP 04/29/2016 FINDINGS: Again noted is dilatation of the common bile duct. Contrast drains into the duodenum. Cannot exclude a stricture at the ampulla. No definite stones. Surgical changes in lumbar spine. IMPRESSION: Biliary system is patent.  No definite stones. Chronic dilatation of the common bile duct. Findings could be related to an overlying stricture but nonspecific. Electronically Signed   By: Markus Daft M.D.   On: 06/28/2016 09:59   Prior to Admission medications   Medication Sig Start Date End Date Taking? Authorizing Provider  acetaminophen (TYLENOL) 500 MG tablet Take 500 mg by mouth daily.   Yes Historical Provider, MD  atorvastatin (LIPITOR) 10 MG tablet Take 10 mg by mouth daily. 04/26/16  Yes Historical Provider, MD  buPROPion (WELLBUTRIN SR) 150 MG 12 hr tablet Take 150 mg by mouth daily. 05/04/16  Yes Historical Provider, MD  Cholecalciferol (VITAMIN D PO) Take 1 capsule by mouth at bedtime. Take on   Yes Historical Provider, MD  felodipine (PLENDIL) 5 MG 24 hr tablet Take 5 mg by mouth at bedtime.    Yes Historical Provider, MD  fentaNYL (DURAGESIC - DOSED MCG/HR) 100 MCG/HR Place 1  patch onto the skin every other day.    Yes Historical Provider, MD  FLUoxetine (PROZAC) 20 MG capsule Take 20 mg by mouth at bedtime.    Yes Historical Provider, MD  HYDROcodone-acetaminophen (NORCO/VICODIN) 5-325 MG per tablet Take 1 tablet by mouth every 6 (six) hours as needed for pain.   Yes Historical Provider, MD  hyoscyamine (LEVSIN/SL) 0.125 MG SL tablet Take 1-2 tablets by mouth every 6 hours as needed for abdominal cramping 04/26/16  Yes Jerene Bears, MD  levothyroxine (SYNTHROID,  LEVOTHROID) 100 MCG tablet Take 100 mcg by mouth daily before breakfast.   Yes Historical Provider, MD  omeprazole (PRILOSEC) 40 MG capsule Take 40 mg by mouth 2 (two) times daily.   Yes Historical Provider, MD  ondansetron (ZOFRAN ODT) 4 MG disintegrating tablet Take 1-2 tablets by mouth every 8 hours as needed for nausea 06/09/16  Yes Jerene Bears, MD  rOPINIRole (REQUIP) 1 MG tablet Take 1 mg by mouth at bedtime.    Yes Historical Provider, MD  valsartan (DIOVAN) 320 MG tablet Take 320 mg by mouth at bedtime. 06/19/16  Yes Historical Provider, MD  zolpidem (AMBIEN) 10 MG tablet Take 5-10 mg by mouth at bedtime as needed for sleep.    Yes Historical Provider, MD  aspirin EC 81 MG tablet Take 1 tablet (81 mg total) by mouth daily. 05/28/13   Peter M Martinique, MD  fluconazole (DIFLUCAN) 100 MG tablet 200 mg by mouth X 1 day and then 100 mg by mouth daily for 13 days. Patient not taking: Reported on 06/22/2016 05/25/16   Jerene Bears, MD  oxyCODONE (OXY IR/ROXICODONE) 5 MG immediate release tablet Take 1-2 tablets (5-10 mg total) by mouth every 6 (six) hours as needed for moderate pain, severe pain or breakthrough pain. 06/28/16   Rolm Bookbinder, MD    Medications: . buPROPion  150 mg Oral Daily  . enoxaparin (LOVENOX) injection  40 mg Subcutaneous Q24H  . feeding supplement  1 Container Oral TID BM  . felodipine  5 mg Oral QHS  . fentaNYL  100 mcg Transdermal QODAY  . FLUoxetine  20 mg Oral QHS  . irbesartan  75 mg Oral Daily  . levothyroxine  100 mcg Oral QAC breakfast  . ondansetron  4 mg Intravenous Q6H  . pantoprazole  40 mg Oral Daily  . rOPINIRole  1 mg Oral QHS   . sodium chloride 50 mL/hr at 06/28/16 1949    Arthritis Back Pain Gastric Ulcer Gastroesophageal Reflux Disease Hemorrhoids High blood pressure Migraine Headache Thyroid Disease  Assessment/Plan Chronic cholecystitis S/p laparoscopic cholecystectomy 06/28/16,Dr.Wakefield Chronic pain   Hypokalemia GERD Dehydration FEN:  Full liquids/IV fluids  => adding KCL and increasing rate ID: Cipro pre op DVT:  Lovenox  Plan:  Reviewed with Dr. Donne Hazel, increase fluids, add K+, change her to Fentanyl IV, add pepcid for GERD, (she is on PPI Po) hydrate better and recheck labs in AM.  EKG reviewed I do not see any significant changes.        LOS: 0 days    Jamaar Howes 06/29/2016 [phone]

## 2016-06-29 NOTE — Progress Notes (Signed)
Initial Nutrition Assessment  DOCUMENTATION CODES:   Severe malnutrition in context of chronic illness  INTERVENTION:   -Once diet is advanced, resume:  Boost Breeze po TID, each supplement provides 250 kcal and 9 grams of protein  NUTRITION DIAGNOSIS:   Malnutrition related to chronic illness as evidenced by severe depletion of muscle mass, severe depletion of body fat.  GOAL:   Patient will meet greater than or equal to 90% of their needs  MONITOR:   PO intake, Supplement acceptance, Diet advancement, Labs, Weight trends, Skin, I & O's  REASON FOR ASSESSMENT:   Malnutrition Screening Tool    ASSESSMENT:   78 yof referred by Dr Zenovia Jarred for possible cholecystectomy.  she has pmh of multiple liver hemangiomas, ibs, back pain, htn who presents with n/v and some abd pain.  she over past 4-5 years has worsened in terms of severity and frequency.   Pt admitted with gallbladder disease. Pt s/p lap cholecystectomy on 06/28/16.   Hx mainly obtained from pt husband at bedside. He reports pt has had a poor appetite related to abdominal pain and nausea over the past month. He shares that her diet has mainly consisted of potato soup, broth, and jello. Pt is very selected in what she eats she she become nausea with very strong food odors. Pt shares that she has only drank ice water since admission ("juice makes my stomach burn")..   Pt husband estimates that pt has lost approximately 10-20# over the past month, however, this is mont consistent with wt hx. Per wt hx, wt has been stable over the past several years.   Nutrition-Focused physical exam completed. Findings are moderate to severe fat depletion, moderate to severe muscle depletion, and no edema.   At time of visit, pt was on a full liquid diet. Pt reports no interest in try liquids at this time. She does not like Ensure, reporting it makes her stomach hurt. Pt amenable to Boost Breeze supplement. Discussed importance of good PO  and supplement intake to promote healing. Also discussed ordering cold foods to help minimize food odors to relieve nausea.   Labs reviewed: Na: 133, K: 3.3 (on IV supplementation).   Diet Order:  Diet NPO time specified Except for: Ice Chips, Sips with Meds  Skin:  Reviewed, no issues  Last BM:  06/27/16  Height:   Ht Readings from Last 1 Encounters:  06/27/16 5\' 6"  (1.676 m)    Weight:   Wt Readings from Last 1 Encounters:  06/28/16 118 lb (53.5 kg)    Ideal Body Weight:  59.1 kg  BMI:  Body mass index is 19.05 kg/m.  Estimated Nutritional Needs:   Kcal:  1600-1800  Protein:  80-95 grams  Fluid:  1.6-1.8 L  EDUCATION NEEDS:   Education needs addressed  Erhardt Dada A. Jimmye Norman, RD, LDN, CDE Pager: [phone] After hours Pager: [phone]

## 2016-06-30 ENCOUNTER — Inpatient Hospital Stay (HOSPITAL_COMMUNITY): Payer: Medicare Other

## 2016-06-30 DIAGNOSIS — E43 Unspecified severe protein-calorie malnutrition: Secondary | ICD-10-CM | POA: Insufficient documentation

## 2016-06-30 LAB — BASIC METABOLIC PANEL WITH GFR
Anion gap: 10 (ref 5–15)
BUN: 9 mg/dL (ref 6–20)
CO2: 28 mmol/L (ref 22–32)
Calcium: 9.9 mg/dL (ref 8.9–10.3)
Chloride: 97 mmol/L — ABNORMAL LOW (ref 101–111)
Creatinine, Ser: 0.67 mg/dL (ref 0.44–1.00)
GFR calc Af Amer: >60 mL/min (ref >60)
GFR calc non Af Amer: >60 mL/min (ref >60)
Glucose, Bld: 93 mg/dL (ref 65–99)
Potassium: 3.3 mmol/L — ABNORMAL LOW (ref 3.5–5.1)
Sodium: 135 mmol/L (ref 135–145)

## 2016-06-30 LAB — CBC
HCT: 42.2 % (ref 36.0–46.0)
Hemoglobin: 14.1 g/dL (ref 12.0–15.0)
MCH: 32.1 pg (ref 26.0–34.0)
MCHC: 33.4 g/dL (ref 30.0–36.0)
MCV: 96.1 fL (ref 78.0–100.0)
Platelets: 348 K/uL (ref 150–400)
RBC: 4.39 MIL/uL (ref 3.87–5.11)
RDW: 12.1 % (ref 11.5–15.5)
WBC: 12.8 K/uL — ABNORMAL HIGH (ref 4.0–10.5)

## 2016-06-30 MED ORDER — FOLIC ACID 1 MG PO TABS
1.0000 mg | ORAL_TABLET | Freq: Every day | ORAL | Status: DC
  Administered 2016-07-01 – 2016-07-03 (×3): 1 mg via ORAL
  Filled 2016-06-30 (×3): qty 1

## 2016-06-30 MED ORDER — ONDANSETRON HCL 4 MG/2ML IJ SOLN
4.0000 mg | Freq: Four times a day (QID) | INTRAMUSCULAR | Status: DC | PRN
  Administered 2016-06-30 – 2016-07-02 (×4): 4 mg via INTRAVENOUS
  Filled 2016-06-30 (×4): qty 2

## 2016-06-30 MED ORDER — METOCLOPRAMIDE HCL 5 MG/ML IJ SOLN
5.0000 mg | Freq: Once | INTRAMUSCULAR | Status: AC
  Administered 2016-06-30: 5 mg via INTRAVENOUS
  Filled 2016-06-30: qty 2

## 2016-06-30 MED ORDER — KETOROLAC TROMETHAMINE 15 MG/ML IJ SOLN
15.0000 mg | Freq: Once | INTRAMUSCULAR | Status: AC
  Administered 2016-06-30: 15 mg via INTRAVENOUS
  Filled 2016-06-30: qty 1

## 2016-06-30 MED ORDER — METOCLOPRAMIDE HCL 5 MG/ML IJ SOLN
5.0000 mg | Freq: Three times a day (TID) | INTRAMUSCULAR | Status: AC | PRN
  Administered 2016-06-30: 5 mg via INTRAVENOUS
  Filled 2016-06-30: qty 2

## 2016-06-30 MED ORDER — VITAMIN B-1 100 MG PO TABS
100.0000 mg | ORAL_TABLET | Freq: Every day | ORAL | Status: DC
  Administered 2016-07-02 – 2016-07-03 (×2): 100 mg via ORAL
  Filled 2016-06-30 (×3): qty 1

## 2016-06-30 MED ORDER — LORAZEPAM 2 MG/ML IJ SOLN
0.0000 mg | Freq: Two times a day (BID) | INTRAMUSCULAR | Status: DC
  Administered 2016-07-02 (×2): 2 mg via INTRAVENOUS
  Filled 2016-06-30 (×2): qty 1

## 2016-06-30 MED ORDER — LORAZEPAM 2 MG/ML IJ SOLN
1.0000 mg | Freq: Four times a day (QID) | INTRAMUSCULAR | Status: AC | PRN
  Administered 2016-07-01: 1 mg via INTRAVENOUS
  Filled 2016-06-30: qty 1

## 2016-06-30 MED ORDER — LORAZEPAM 1 MG PO TABS: 1.0000 mg | ORAL_TABLET | Freq: Four times a day (QID) | ORAL | Status: AC | PRN

## 2016-06-30 MED ORDER — THIAMINE HCL 100 MG/ML IJ SOLN
100.0000 mg | Freq: Every day | INTRAMUSCULAR | Status: DC
  Administered 2016-06-30 – 2016-07-01 (×2): 100 mg via INTRAVENOUS
  Filled 2016-06-30 (×2): qty 2

## 2016-06-30 MED ORDER — ADULT MULTIVITAMIN W/MINERALS CH
1.0000 | ORAL_TABLET | Freq: Every day | ORAL | Status: DC
  Administered 2016-07-01 – 2016-07-03 (×3): 1 via ORAL
  Filled 2016-06-30 (×3): qty 1

## 2016-06-30 MED ORDER — LORAZEPAM 2 MG/ML IJ SOLN
0.0000 mg | Freq: Four times a day (QID) | INTRAMUSCULAR | Status: AC
  Administered 2016-07-01 (×2): 2 mg via INTRAVENOUS
  Filled 2016-06-30: qty 1

## 2016-06-30 MED ORDER — KCL IN DEXTROSE-NACL 20-5-0.45 MEQ/L-%-% IV SOLN
INTRAVENOUS | Status: DC
  Administered 2016-06-30: 11:00:00 via INTRAVENOUS
  Administered 2016-06-30: 1 mL via INTRAVENOUS
  Administered 2016-07-01 – 2016-07-02 (×3): via INTRAVENOUS
  Filled 2016-06-30 (×6): qty 1000

## 2016-06-30 NOTE — Progress Notes (Signed)
Telemetry called pt has PVC, I checked the patient alert and oriented, no s/s of chest pain, but nauseated, will continue to monitor.

## 2016-06-30 NOTE — Consult Note (Signed)
[facility] CM Primary Care Navigator  06/30/2016  [name] [birth year] [id number]    Met with patient and husband [name]) at the bedside to identify possible discharge needs.  Patient states having worsening abdominal pain, nausea and vomiting that had led to this admission. Patient endorses Dr. Joyce [name] with [facility] as the primary care provider.   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   Patient shares managing her own medications at home using "pill box" system.   Her husband provides transportation to doctors'appointments as stated.  Husband it the primary caregiverat home.   Plan for discharge still not determined at present but possibly home when stable per patient.  Patient and husband expressed understanding to call primary care provider's office once discharged, for a post discharge follow-up appointment within a week or sooner if needs arise.Patient letter given for her reminder.  Patient and husband voiced no other needs or concerns at this time.    For additional questions please contact:  Edwena Felty A. Jailani Hogans, BSN, RN-BC [facility] [place] PRIMARY CARE Navigator Cell: [phone]

## 2016-06-30 NOTE — Progress Notes (Signed)
Patient ID: [name], female   DOB: 1945-08-31, 70 y.o.   MRN: [id number] Feeling much better, slept some, no more n/v in several hours.  Hopefully has turned corner. xrays were negative.  Will continue to follow

## 2016-06-30 NOTE — Progress Notes (Signed)
2 Days Post-Op  Subjective: Still with n/v, minimal flatus, still with pain  Objective: Vital signs in last 24 hours: Temp:  [98.4 F (36.9 C)-99 F (37.2 C)] 98.4 F (36.9 C) (11/10 0454) Pulse Rate:  [74-83] 83 (11/10 0454) Resp:  [18-20] 20 (11/10 0454) BP: (156-181)/(65-94) 156/94 (11/10 0454) SpO2:  [96 %-100 %] 100 % (11/10 0454) Last BM Date: 06/27/16  Intake/Output from previous day: 11/09 0701 - 11/10 0700 In: 1413.3 [P.O.:20; I.V.:1293.3; IV Piggyback:100] Out: 1750 [Urine:1750] Intake/Output this shift: Total I/O In: -  Out: 300 [Urine:300]  General appearance: mild distress Resp: clear to auscultation bilaterally Cardio: regular rate and rhythm GI: soft some bs present incisions clean and dry appropriately tender  Lab Results:   Recent Labs  06/29/16 1415 06/30/16 0359  WBC 13.8* 12.8*  HGB 14.2 14.1  HCT 41.4 42.2  PLT 327 348   BMET  Recent Labs  06/29/16 1415 06/30/16 0359  NA 133* 135  K 3.3* 3.3*  CL 95* 97*  CO2 26 28  GLUCOSE 116* 93  BUN 10 9  CREATININE 0.78 0.67  CALCIUM 10.1 9.9   PT/INR No results for input(s): LABPROT, INR in the last 72 hours. ABG No results for input(s): PHART, HCO3 in the last 72 hours.  Invalid input(s): PCO2, PO2  Studies/Results: Dg Cholangiogram Operative  Result Date: 06/28/2016 CLINICAL DATA:  Laparoscopic cholecystectomy. EXAM: INTRAOPERATIVE CHOLANGIOGRAM TECHNIQUE: Cholangiographic images from the C-arm fluoroscopic device were submitted for interpretation post-operatively. Please see the procedural report for the amount of contrast and the fluoroscopy time utilized. COMPARISON:  MRCP 04/29/2016 FINDINGS: Again noted is dilatation of the common bile duct. Contrast drains into the duodenum. Cannot exclude a stricture at the ampulla. No definite stones. Surgical changes in lumbar spine. IMPRESSION: Biliary system is patent.  No definite stones. Chronic dilatation of the common bile duct. Findings  could be related to an overlying stricture but nonspecific. Electronically Signed   By: Markus Daft M.D.   On: 06/28/2016 09:59    Anti-infectives: Anti-infectives    Start     Dose/Rate Route Frequency Ordered Stop   06/28/16 0755  ciprofloxacin (CIPRO) 400 MG/200ML IVPB  Status:  Discontinued    Comments:  Claybon Jabs   : cabinet override      06/28/16 0755 06/28/16 0804   06/28/16 0607  ciprofloxacin (CIPRO) IVPB 400 mg     400 mg 200 mL/hr over 60 Minutes Intravenous On call to O.R. 06/28/16 0607 06/28/16 0818      Assessment/Plan: POD 1 lap chole 1. Will continue iv pain meds, fent patch. Apparently she does drink beer daily and I will start ciwa protocol 2. pulm toilet, needs to be up more 3. Possible ileus I will check films first, her vitals are normal, labs pretty normal without concern either, might need ng tube and discussed possible reglan with side effect of tardive dyskinesia 4. Lovenox, scds   [facility] 06/30/2016

## 2016-07-01 LAB — COMPREHENSIVE METABOLIC PANEL
ALT: 19 U/L (ref 14–54)
AST: 28 U/L (ref 15–41)
Albumin: 3.3 g/dL — ABNORMAL LOW (ref 3.5–5.0)
Alkaline Phosphatase: 126 U/L (ref 38–126)
Anion gap: 10 (ref 5–15)
BUN: 11 mg/dL (ref 6–20)
CO2: 27 mmol/L (ref 22–32)
Calcium: 9.8 mg/dL (ref 8.9–10.3)
Chloride: 101 mmol/L (ref 101–111)
Creatinine, Ser: 0.83 mg/dL (ref 0.44–1.00)
GFR calc Af Amer: >60 mL/min (ref >60)
GFR calc non Af Amer: >60 mL/min (ref >60)
Glucose, Bld: 102 mg/dL — ABNORMAL HIGH (ref 65–99)
Potassium: 3.4 mmol/L — ABNORMAL LOW (ref 3.5–5.1)
Sodium: 138 mmol/L (ref 135–145)
Total Bilirubin: 2.3 mg/dL — ABNORMAL HIGH (ref 0.3–1.2)
Total Protein: 6 g/dL — ABNORMAL LOW (ref 6.5–8.1)

## 2016-07-01 LAB — CBC
HCT: 40.6 % (ref 36.0–46.0)
Hemoglobin: 13.7 g/dL (ref 12.0–15.0)
MCH: 32.5 pg (ref 26.0–34.0)
MCHC: 33.7 g/dL (ref 30.0–36.0)
MCV: 96.2 fL (ref 78.0–100.0)
Platelets: 283 10*3/uL (ref 150–400)
RBC: 4.22 MIL/uL (ref 3.87–5.11)
RDW: 11.8 % (ref 11.5–15.5)
WBC: 10.8 10*3/uL — ABNORMAL HIGH (ref 4.0–10.5)

## 2016-07-01 LAB — MAGNESIUM: Magnesium: 1.9 mg/dL (ref 1.7–2.4)

## 2016-07-01 NOTE — Progress Notes (Signed)
Telemetry called for 8 beats of Vtach, pt complained of abdominal pain given fentanyl and ativan and right now pt is sleeping will continue to monitor.

## 2016-07-01 NOTE — Progress Notes (Signed)
Patient ID: [name], female   DOB: 1945-10-20, 70 y.o.   MRN: [id number]  [facility] Surgery Progress Note  3 Days Post-Op  Subjective: Feels well this morning. Denies current CP or SOB Back on home dose of pain medications (fentanyl patch and norco) Passing minimal flatus. Ambulated yesterday but not yet today  Objective: Vital signs in last 24 hours: Temp:  [98.8 F (37.1 C)-99.8 F (37.7 C)] 98.8 F (37.1 C) (11/11 0646) Pulse Rate:  [65-76] 76 (11/11 0646) Resp:  [16-18] 16 (11/11 0646) BP: (126-174)/(75-82) 126/75 (11/11 0646) SpO2:  [96 %-97 %] 96 % (11/11 0646) Last BM Date: 06/27/16  Intake/Output from previous day: 11/10 0701 - 11/11 0700 In: 1858.3 [P.O.:240; I.V.:1518.3; IV Piggyback:100] Out: 1100 [Urine:1100] Intake/Output this shift: No intake/output data recorded.  PE: Gen:  Alert, NAD, pleasant Card:  RRR Pulm:  CTAB Abd: Soft, ND, appropriately tender, +BS, incisions C/D/I  Lab Results:   Recent Labs  06/30/16 0359 07/01/16 0716  WBC 12.8* 10.8*  HGB 14.1 13.7  HCT 42.2 40.6  PLT 348 283   BMET  Recent Labs  06/30/16 0359 07/01/16 0716  NA 135 138  K 3.3* 3.4*  CL 97* 101  CO2 28 27  GLUCOSE 93 102*  BUN 9 11  CREATININE 0.67 0.83  CALCIUM 9.9 9.8   PT/INR No results for input(s): LABPROT, INR in the last 72 hours. CMP     Component Value Date/Time   NA 138 07/01/2016 0716   K 3.4 (L) 07/01/2016 0716   CL 101 07/01/2016 0716   CO2 27 07/01/2016 0716   GLUCOSE 102 (H) 07/01/2016 0716   BUN 11 07/01/2016 0716   CREATININE 0.83 07/01/2016 0716   CALCIUM 9.8 07/01/2016 0716   PROT 6.0 (L) 07/01/2016 0716   ALBUMIN 3.3 (L) 07/01/2016 0716   AST 28 07/01/2016 0716   ALT 19 07/01/2016 0716   ALKPHOS 126 07/01/2016 0716   BILITOT 2.3 (H) 07/01/2016 0716   GFRNONAA >60 07/01/2016 0716   GFRAA >60 07/01/2016 0716   Lipase     Component Value Date/Time   LIPASE 24 06/29/2016 1415       Studies/Results: Dg  Abd 2 Views  Result Date: 06/30/2016 CLINICAL DATA:  Ileus. EXAM: ABDOMEN - 2 VIEW COMPARISON:  07/12/2004 . FINDINGS: Surgical clips right upper quadrant. Soft tissue structures the abdomen are unremarkable. No bowel distention or free air. Contrast noted within the colon. Aortoiliac atherosclerotic vascular calcification. Prior lower lumbar spine fusion. Lumbar spine scoliosis and degenerative change. Right base pleural scarring. IMPRESSION: 1.  No acute abnormality.  No bowel distention.  No free air. 2. Aortoiliac atherosclerotic vascular disease. Electronically Signed   By: Marcello Moores  Register   On: 06/30/2016 09:54    Anti-infectives: Anti-infectives    Start     Dose/Rate Route Frequency Ordered Stop   06/28/16 0755  ciprofloxacin (CIPRO) 400 MG/200ML IVPB  Status:  Discontinued    Comments:  Claybon Jabs   : cabinet override      06/28/16 0755 06/28/16 0804   06/28/16 0607  ciprofloxacin (CIPRO) IVPB 400 mg     400 mg 200 mL/hr over 60 Minutes Intravenous On call to O.R. 06/28/16 0607 06/28/16 0818       Assessment/Plan S/p lap chole 06/28/16 Dr. Donne Hazel - POD 3 - WBC trending down, 10.8 - PVCs and Vtach yesterday, EKG ok. Denies CP  Hypokalemia - mild 3.4, continue K in IVF CIWA  VTE - SCDs, lovenox  FEN - soft  Plan - Encourage more ambulation. Advance to soft diet. Will check magnesium. CBC/BMP in AM  Will continue iv pain meds, fent patch    LOS: 2 days    Jerrye Beavers , [facility] Surgery 07/01/2016, 10:36 AM Pager: [phone] Consults: [phone] Mon-Fri 7:00 am-4:30 pm Sat-Sun 7:00 am-11:30 am

## 2016-07-01 NOTE — Progress Notes (Signed)
Pt had multiple PVCs, Vtach but the pt is sleeping, no complained of chest pain, BP 126's asymptomatic, endorsed to day shift, will continue to monitor.

## 2016-07-01 NOTE — Progress Notes (Signed)
Pt has  6 beats of vtach pt deep sleep asymptomatic, will continue to monitor.

## 2016-07-01 NOTE — Evaluation (Signed)
Physical Therapy Evaluation Patient Details Name: [name] MRN: [id number] DOB: Mar 05, 1946 Today's Date: 07/01/2016   History of Present Illness  Pt admit for cholecystectomy with post op Vtach.   Clinical Impression  Pt admitted with above diagnosis. Pt currently with functional limitations due to the deficits listed below (see PT Problem List). Pt did well with RW with gait. Husband supportive.   Pt will benefit from skilled PT to increase their independence and safety with mobility to allow discharge to the venue listed below.      Follow Up Recommendations Home health PT;Supervision/Assistance - 24 hour    Equipment Recommendations  None recommended by PT    Recommendations for Other Services       Precautions / Restrictions Precautions Precautions: Fall Restrictions Weight Bearing Restrictions: No      Mobility  Bed Mobility Overal bed mobility: Independent             General bed mobility comments: slow  Transfers Overall transfer level: Needs assistance Equipment used: Rolling walker (2 wheeled) Transfers: Sit to/from Stand Sit to Stand: Min guard         General transfer comment: Pt was able to sit to stand with min guard assist.  Ambulation/Gait Ambulation/Gait assistance: Min guard;Supervision Ambulation Distance (Feet): 450 Feet Assistive device: Rolling walker (2 wheeled) Gait Pattern/deviations: Step-through pattern;Decreased stride length   Gait velocity interpretation: Below normal speed for age/gender General Gait Details: Pt needed cues for upright posture as she is slighlty flexed.  Also cues to stay close to rW.  Overall fair walker safety.  Husband guards pt well.   Stairs Stairs: Yes Stairs assistance: Min guard Stair Management: Backwards;Step to pattern;With walker Number of Stairs: 3 General stair comments: Able to ascend and descend steps with min guard assit and husband knows how to cue pt  Wheelchair Mobility     Modified Rankin (Stroke Patients Only)       Balance Overall balance assessment: Needs assistance         Standing balance support: Bilateral upper extremity supported;During functional activity Standing balance-Leahy Scale: Poor Standing balance comment: relies on rW                             Pertinent Vitals/Pain Pain Assessment: 0-10 Pain Score: 8  Pain Location: incision Pain Descriptors / Indicators: Aching;Grimacing;Guarding Pain Intervention(s): Limited activity within patient's tolerance;Monitored during session;Premedicated before session;Repositioned;Patient requesting pain meds-RN notified;RN gave pain meds during session  VSS with HR 116 bpm  Home Living Family/patient expects to be discharged to:: Private residence Living Arrangements: Spouse/significant other Available Help at Discharge: Family;Available 24 hours/day Type of Home: House Home Access: Stairs to enter Entrance Stairs-Rails: None Entrance Stairs-Number of Steps: 2 Home Layout: Multi-level Home Equipment: Walker - 2 wheels;Walker - 4 wheels;Cane - single point;Bedside commode      Prior Function Level of Independence: Independent               Hand Dominance        Extremity/Trunk Assessment   Upper Extremity Assessment: Defer to OT evaluation           Lower Extremity Assessment: Generalized weakness      Cervical / Trunk Assessment: Kyphotic  Communication   Communication: No difficulties  Cognition Arousal/Alertness: Awake/alert Behavior During Therapy: WFL for tasks assessed/performed Overall Cognitive Status: Within Functional Limits for tasks assessed  General Comments      Exercises     Assessment/Plan    PT Assessment Patient needs continued PT services  PT Problem List Decreased activity tolerance;Decreased balance;Decreased mobility;Decreased knowledge of use of DME;Decreased safety awareness;Decreased  knowledge of precautions;Pain          PT Treatment Interventions DME instruction;Gait training;Functional mobility training;Therapeutic activities;Therapeutic exercise;Balance training;Patient/family education    PT Goals (Current goals can be found in the Care Plan section)  Acute Rehab PT Goals Patient Stated Goal: to get better PT Goal Formulation: With patient Time For Goal Achievement: 07/15/16 Potential to Achieve Goals: Good    Frequency Min 3X/week   Barriers to discharge        Co-evaluation               End of Session Equipment Utilized During Treatment: Gait belt Activity Tolerance: Patient limited by fatigue Patient left: in chair;with call bell/phone within reach;with family/visitor present Nurse Communication: Mobility status         Time: [id number] PT Time Calculation (min) (ACUTE ONLY): 32 min   Charges:   PT Evaluation $PT Eval Moderate Complexity: 1 Procedure PT Treatments $Gait Training: 8-22 mins   PT G CodesDenice Paradise 07-31-16, 4:04 PM Noora Locascio,PT Acute Rehabilitation [phone] (pager)

## 2016-07-01 NOTE — Progress Notes (Signed)
Pt 5  Beats vtach pt asleep no s/s of pain, asymptomatic.

## 2016-07-02 LAB — BASIC METABOLIC PANEL
Anion gap: 6 (ref 5–15)
BUN: 16 mg/dL (ref 6–20)
CO2: 25 mmol/L (ref 22–32)
Calcium: 9.3 mg/dL (ref 8.9–10.3)
Chloride: 104 mmol/L (ref 101–111)
Creatinine, Ser: 0.81 mg/dL (ref 0.44–1.00)
GFR calc Af Amer: >60 mL/min (ref >60)
GFR calc non Af Amer: >60 mL/min (ref >60)
Glucose, Bld: 160 mg/dL — ABNORMAL HIGH (ref 65–99)
Potassium: 3.1 mmol/L — ABNORMAL LOW (ref 3.5–5.1)
Sodium: 135 mmol/L (ref 135–145)

## 2016-07-02 LAB — CBC
HCT: 34.7 % — ABNORMAL LOW (ref 36.0–46.0)
Hemoglobin: 11.4 g/dL — ABNORMAL LOW (ref 12.0–15.0)
MCH: 31.7 pg (ref 26.0–34.0)
MCHC: 32.9 g/dL (ref 30.0–36.0)
MCV: 96.4 fL (ref 78.0–100.0)
Platelets: 259 10*3/uL (ref 150–400)
RBC: 3.6 MIL/uL — ABNORMAL LOW (ref 3.87–5.11)
RDW: 12 % (ref 11.5–15.5)
WBC: 7.9 10*3/uL (ref 4.0–10.5)

## 2016-07-02 MED ORDER — FAMOTIDINE 20 MG PO TABS
20.0000 mg | ORAL_TABLET | Freq: Two times a day (BID) | ORAL | Status: DC
  Administered 2016-07-02 – 2016-07-03 (×2): 20 mg via ORAL
  Filled 2016-07-02 (×2): qty 1

## 2016-07-02 MED ORDER — POTASSIUM CHLORIDE CRYS ER 20 MEQ PO TBCR
40.0000 meq | EXTENDED_RELEASE_TABLET | Freq: Two times a day (BID) | ORAL | Status: DC
  Administered 2016-07-02 (×2): 40 meq via ORAL
  Filled 2016-07-02 (×2): qty 2

## 2016-07-02 NOTE — Progress Notes (Signed)
Pt ambulated a full lap on unit using a front wheel walker this pm with no issues.

## 2016-07-02 NOTE — Progress Notes (Signed)
Patient ID: [name], female   DOB: 10/10/1945, 70 y.o.   MRN: [id number]  [facility] Surgery Progress Note  4 Days Post-Op  Subjective: Feeling well this morning. Appetite returning and tolerating diet well.  NAE over night.  Objective: Vital signs in last 24 hours: Temp:  [98.2 F (36.8 C)-99.2 F (37.3 C)] 98.6 F (37 C) (11/12 0620) Pulse Rate:  [48-84] 73 (11/12 0620) Resp:  [15-17] 15 (11/12 0620) BP: (106-140)/(44-83) 140/83 (11/12 0620) SpO2:  [97 %-98 %] 97 % (11/12 0620) Last BM Date: 06/27/16  Intake/Output from previous day: 11/11 0701 - 11/12 0700 In: 2437.3 [P.O.:940; I.V.:1447.3; IV Piggyback:50] Out: -  Intake/Output this shift: No intake/output data recorded.  PE: Gen:  Alert, NAD, pleasant Card:  RRR Pulm:  CTAB Abd: Soft, ND, appropriately tender, +BS, incisions C/D/I  Lab Results:   Recent Labs  07/01/16 0716 07/02/16 0442  WBC 10.8* 7.9  HGB 13.7 11.4*  HCT 40.6 34.7*  PLT 283 259   BMET  Recent Labs  07/01/16 0716 07/02/16 0442  NA 138 135  K 3.4* 3.1*  CL 101 104  CO2 27 25  GLUCOSE 102* 160*  BUN 11 16  CREATININE 0.83 0.81  CALCIUM 9.8 9.3   PT/INR No results for input(s): LABPROT, INR in the last 72 hours. CMP     Component Value Date/Time   NA 135 07/02/2016 0442   K 3.1 (L) 07/02/2016 0442   CL 104 07/02/2016 0442   CO2 25 07/02/2016 0442   GLUCOSE 160 (H) 07/02/2016 0442   BUN 16 07/02/2016 0442   CREATININE 0.81 07/02/2016 0442   CALCIUM 9.3 07/02/2016 0442   PROT 6.0 (L) 07/01/2016 0716   ALBUMIN 3.3 (L) 07/01/2016 0716   AST 28 07/01/2016 0716   ALT 19 07/01/2016 0716   ALKPHOS 126 07/01/2016 0716   BILITOT 2.3 (H) 07/01/2016 0716   GFRNONAA >60 07/02/2016 0442   GFRAA >60 07/02/2016 0442   Lipase     Component Value Date/Time   LIPASE 24 06/29/2016 1415       Studies/Results: No results found.  Anti-infectives: Anti-infectives    Start     Dose/Rate Route Frequency Ordered Stop    06/28/16 0755  ciprofloxacin (CIPRO) 400 MG/200ML IVPB  Status:  Discontinued    Comments:  Claybon Jabs   : cabinet override      06/28/16 0755 06/28/16 0804   06/28/16 0607  ciprofloxacin (CIPRO) IVPB 400 mg     400 mg 200 mL/hr over 60 Minutes Intravenous On call to O.R. 06/28/16 0607 06/28/16 0818       Assessment/Plan S/p lap chole 06/28/16 Dr. Donne Hazel - POD 4 - WBC trending down, 7.9 today - no arrhythmias noted on telemetry in last 24hr  Hypokalemia - 3.1 today from 3.4, continue K in IVF and add PO KCl CIWA  VTE - SCDs, lovenox FEN - soft  Plan - add PO Kcl 40 BID. Continue soft diet. Encourage more ambulation. Continue to monitor telemetry. Will check labs in Am, if electrolytes ok and no issues with telemetry will likely be able to go home tomorrow.   LOS: 3 days    Jerrye Beavers , [facility] Surgery 07/02/2016, 11:43 AM Pager: [phone] Consults: [phone] Mon-Fri 7:00 am-4:30 pm Sat-Sun 7:00 am-11:30 am

## 2016-07-03 LAB — BASIC METABOLIC PANEL
Anion gap: 3 — ABNORMAL LOW (ref 5–15)
BUN: 8 mg/dL (ref 6–20)
CO2: 27 mmol/L (ref 22–32)
Calcium: 9.3 mg/dL (ref 8.9–10.3)
Chloride: 105 mmol/L (ref 101–111)
Creatinine, Ser: 0.75 mg/dL (ref 0.44–1.00)
GFR calc Af Amer: >60 mL/min (ref >60)
GFR calc non Af Amer: >60 mL/min (ref >60)
Glucose, Bld: 112 mg/dL — ABNORMAL HIGH (ref 65–99)
Potassium: 4.8 mmol/L (ref 3.5–5.1)
Sodium: 135 mmol/L (ref 135–145)

## 2016-07-03 LAB — CBC
HCT: 33 % — ABNORMAL LOW (ref 36.0–46.0)
Hemoglobin: 10.8 g/dL — ABNORMAL LOW (ref 12.0–15.0)
MCH: 32 pg (ref 26.0–34.0)
MCHC: 32.7 g/dL (ref 30.0–36.0)
MCV: 97.6 fL (ref 78.0–100.0)
Platelets: 277 10*3/uL (ref 150–400)
RBC: 3.38 MIL/uL — ABNORMAL LOW (ref 3.87–5.11)
RDW: 11.9 % (ref 11.5–15.5)
WBC: 5.3 10*3/uL (ref 4.0–10.5)

## 2016-07-03 MED ORDER — ONDANSETRON HCL 4 MG PO TABS: 4.0000 mg | ORAL_TABLET | Freq: Three times a day (TID) | ORAL | 1 refills | Status: DC | PRN

## 2016-07-03 MED ORDER — METHOCARBAMOL 500 MG PO TABS: 500.0000 mg | ORAL_TABLET | Freq: Four times a day (QID) | ORAL | 0 refills | Status: DC | PRN

## 2016-07-03 MED ORDER — HYDROCODONE-ACETAMINOPHEN 5-325 MG PO TABS: 1.0000 | ORAL_TABLET | ORAL | 0 refills | Status: DC | PRN

## 2016-07-03 NOTE — Progress Notes (Signed)
5 Days Post-Op  Subjective: Feels much better, thinks some of preop symptoms gone, had bm, passing flatus, no more nausea, pain controlled, ambulating  Objective: Vital signs in last 24 hours: Temp:  [98.4 F (36.9 C)-99.1 F (37.3 C)] 99 F (37.2 C) (11/13 0344) Pulse Rate:  [73-83] 74 (11/13 0344) Resp:  [16-19] 19 (11/13 0344) BP: (105-119)/(50-62) 105/50 (11/13 0344) SpO2:  [97 %-98 %] 97 % (11/13 0344) Last BM Date: 06/27/16  Intake/Output from previous day: 11/12 0701 - 11/13 0700 In: 1080 [P.O.:1080] Out: -  Intake/Output this shift: No intake/output data recorded.  Resp: clear to auscultation bilaterally Cardio: regular rate and rhythm GI: incisions clean soft approp tender  Lab Results:   Recent Labs  07/02/16 0442 07/03/16 0322  WBC 7.9 5.3  HGB 11.4* 10.8*  HCT 34.7* 33.0*  PLT 259 277   BMET  Recent Labs  07/02/16 0442 07/03/16 0322  NA 135 135  K 3.1* 4.8  CL 104 105  CO2 25 27  GLUCOSE 160* 112*  BUN 16 8  CREATININE 0.81 0.75  CALCIUM 9.3 9.3   PT/INR No results for input(s): LABPROT, INR in the last 72 hours. ABG No results for input(s): PHART, HCO3 in the last 72 hours.  Invalid input(s): PCO2, PO2  Studies/Results: No results found.  Anti-infectives: Anti-infectives    Start     Dose/Rate Route Frequency Ordered Stop   06/28/16 0755  ciprofloxacin (CIPRO) 400 MG/200ML IVPB  Status:  Discontinued    Comments:  Claybon Jabs   : cabinet override      06/28/16 0755 06/28/16 0804   06/28/16 0607  ciprofloxacin (CIPRO) IVPB 400 mg     400 mg 200 mL/hr over 60 Minutes Intravenous On call to O.R. 06/28/16 0607 06/28/16 0818      Assessment/Plan: POD 5 lap chole  Ready for dc today  Gillie Fleites 07/03/2016

## 2016-07-03 NOTE — Care Management Important Message (Signed)
Important Message  Patient Details  Name: [name] MRN: [id number] Date of Birth: November 19, 1945   Medicare Important Message Given:  Yes    Murvin Gift Montine Circle 07/03/2016, 11:59 AM

## 2016-07-03 NOTE — Progress Notes (Signed)
D/c instructions reviewed with pt and her husband. Copy of instructions were given. Clarification of scripts had to be done with Dr Donne Hazel, pt has one printed script for hydrocodone and other scripts were electronically sent to pt's pharmacy. Pt ready for d/c now. Waiting for hospital volunteer to arrive to take out via wheelchair.

## 2016-07-03 NOTE — Discharge Summary (Signed)
Physician Discharge Summary  Patient ID: [name] MRN: [id number] DOB/AGE: [birth year] 70 y.o.  Admit date: 06/28/2016 Discharge date: 07/03/2016  Admission Diagnoses: HTN Chronic back pain GERD History stroke Liver hemangioma COPD  Discharge Diagnoses:  Active Problems:   S/P laparoscopic cholecystectomy   Protein-calorie malnutrition, severe   Discharged Condition: good  Hospital Course: 70 yof who was admitted and underwent lap chole with significant lysis of adhesions.  She had some significant pain control issues and had nausea/vomiting.  This took several days to resolve.  She eventually was ambulating, tolerating diet and had all symptoms controlled. She will be discharged today.   Consults: None  Significant Diagnostic Studies: none  Treatments: surgery: laparoscopic cholecystectomy    Disposition: home   Follow-up Information    Geron Mulford, MD Follow up in 2 week(s).   Specialty:  General Surgery Contact information: [address] [place] [address] [phone]           Signed: Rolm Bookbinder 07/03/2016, 8:41 AM

## 2016-07-03 NOTE — Care Management Note (Signed)
Case Management Note  Patient Details  Name: SHAMELA TORREALBA MRN: [id number] Date of Birth: [birth year]  Subjective/Objective:                    Action/Plan: PT recommending home health PT , no MD order. Spoke with patient and spouse at bedside, at this time patient does not feel that she needs home health, spouse in agreement . If they change their mind they will call Dr Donne Hazel or Dr Danna Hefty office to arrange. Expected Discharge Date:  06/29/16               Expected Discharge Plan:  Home/Self Care  In-House Referral:     Discharge planning Services  CM Consult  Post Acute Care Choice:    Choice offered to:  Patient, Spouse  DME Arranged:    DME Agency:     HH Arranged:  Patient Refused [place] Agency:  [place]  Status of Service:  Completed, signed off  If discussed at [employer], dates discussed:    Additional Comments:  Marilu Favre, RN 07/03/2016, 10:15 AM

## 2016-08-30 DIAGNOSIS — M25561 Pain in right knee: Secondary | ICD-10-CM | POA: Diagnosis not present

## 2016-08-30 DIAGNOSIS — M1711 Unilateral primary osteoarthritis, right knee: Secondary | ICD-10-CM | POA: Diagnosis not present

## 2016-08-30 DIAGNOSIS — G8929 Other chronic pain: Secondary | ICD-10-CM | POA: Diagnosis not present

## 2016-09-28 ENCOUNTER — Other Ambulatory Visit: Payer: Self-pay

## 2016-09-28 DIAGNOSIS — I83819 Varicose veins of unspecified lower extremities with pain: Secondary | ICD-10-CM

## 2016-10-03 ENCOUNTER — Encounter: Payer: Self-pay | Admitting: Vascular Surgery

## 2016-10-03 ENCOUNTER — Ambulatory Visit (HOSPITAL_COMMUNITY)
Admission: RE | Admit: 2016-10-03 | Discharge: 2016-10-03 | Disposition: A | Discharge status: Home / Self Care | Payer: Medicare Other | Source: Ambulatory Visit | Attending: Vascular Surgery | Admitting: Vascular Surgery

## 2016-10-03 ENCOUNTER — Ambulatory Visit (INDEPENDENT_AMBULATORY_CARE_PROVIDER_SITE_OTHER): Payer: Medicare Other | Admitting: Vascular Surgery

## 2016-10-03 VITALS — BP 151/77 | HR 72 | Temp 97.5°F | Resp 18 | Ht 64.5 in | Wt 121.6 lb

## 2016-10-03 DIAGNOSIS — M25562 Pain in left knee: Secondary | ICD-10-CM | POA: Diagnosis not present

## 2016-10-03 DIAGNOSIS — M25561 Pain in right knee: Secondary | ICD-10-CM

## 2016-10-03 DIAGNOSIS — I83819 Varicose veins of unspecified lower extremities with pain: Secondary | ICD-10-CM | POA: Insufficient documentation

## 2016-10-03 NOTE — Progress Notes (Signed)
[facility] [place]  Patient name: [name] MRN: [id number] DOB: 03-04-1946 Sex: female  REASON FOR CONSULT: Evaluation of bilateral lower extremity pain  HPI: [name] is a 71 y.o. female, who is seen today for evaluation of lower from the pain. She has multiple components of this. She does have severe arthritis in her hands back ankles feet and legs. Is contemplating total knee replacement on the right. She reports discomfort and in both lower extremities. This extends mainly in her lateral calves right greater than left. She does not have any history of arterial insufficiency her lower extremity tissue loss. She does not have any significant lower extremity swelling. She does have episode where she develops blood blisters over her right lateral leg with no known trauma. Also reports areas in her right calf and thigh where she had hypopigmentation and now has returned to her normal color. 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 She has seen dermatology with no known etiology of the skin changes. She does have easy bruising on her arms despite the fact that she reports she has discontinued her aspirin therapy due to this. Has remote history of what sounds like a retinal infarct. Had carotid duplex 2014 which I reviewed which was completely normal.  Past Medical History:  Diagnosis Date  . Allergy   . Arthritis    back, hands, feet , ankles , legs (06/28/2016)  . Cataract    removed both eyes  . 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 Chronic lower back pain   . COPD (chronic obstructive pulmonary disease) ([place])   . Depression   . GERD (gastroesophageal reflux disease)   . 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 HTN (hypertension)   . Hypercholesterolemia   . Hypothyroid   . Irritable bowel   . Liver hemangioma   . Migraine 1990s  . Osteoporosis    . 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 Heart disease Father   . Hypertension Father   . Heart disease Sister     valve surgery  . Colon cancer Neg Hx   . Colon polyps Neg Hx   . Esophageal cancer Neg Hx   . Rectal cancer Neg Hx   . Stomach cancer Neg Hx     SOCIAL HISTORY: Social History   Social History  . Marital status: Married    Spouse name: N/A  . Number of children: 3  . Years of education: N/A   Occupational History  .  Disabled   Social History Main Topics  . Smoking status: Former Smoker    Packs/day: 1.00    Years: 40.00    Types: Cigarettes    Quit date: 2001  . Smokeless tobacco: Never Used  . Alcohol use No  . Drug use: No  . Sexual activity: Not on file   Other Topics Concern  . Not on file   Social History Narrative  . No narrative on file    Allergies  Allergen Reactions  . Penicillins     Causes rash Has patient had a PCN reaction causing immediate rash, facial/tongue/throat swelling, SOB or lightheadedness with hypotension: No Has patient had a PCN reaction causing severe rash  involving mucus membranes or skin necrosis: No Has patient had a PCN reaction that required hospitalization No Has patient had a PCN reaction occurring within the last 10 years: No If all of the above answers are "NO", then may proceed with Cephalosporin use.     Current Outpatient Prescriptions  Medication Sig Dispense Refill  . atorvastatin (LIPITOR) 10 MG tablet Take 10 mg by mouth daily.  11  . buPROPion (WELLBUTRIN SR) 150 MG 12 hr tablet Take 150 mg by mouth daily.  11  . felodipine (PLENDIL) 5 MG 24 hr tablet Take 5 mg by mouth at bedtime.     . fentaNYL (DURAGESIC - DOSED MCG/HR) 100 MCG/HR Place 1 patch onto the skin every other day.     . fluconazole (DIFLUCAN) 100 MG tablet 200 mg by mouth X 1 day and then 100 mg by  mouth daily for 13 days. 15 tablet 0  . FLUoxetine (PROZAC) 20 MG capsule Take 20 mg by mouth at bedtime.     . fluticasone (FLONASE) 50 MCG/ACT nasal spray Place into both nostrils daily.    Marland Kitchen HYDROcodone-acetaminophen (NORCO/VICODIN) 5-325 MG tablet Take 1-2 tablets by mouth every 4 (four) hours as needed for moderate pain. 30 tablet 0  . hyoscyamine (LEVSIN/SL) 0.125 MG SL tablet Take 1-2 tablets by mouth every 6 hours as needed for abdominal cramping 60 tablet 3  . levothyroxine (SYNTHROID, LEVOTHROID) 100 MCG tablet Take 100 mcg by mouth daily before breakfast.    . omeprazole (PRILOSEC) 40 MG capsule Take 40 mg by mouth 2 (two) times daily.    . ondansetron (ZOFRAN ODT) 4 MG disintegrating tablet Take 1-2 tablets by mouth every 8 hours as needed for nausea 60 tablet 0  . quinapril (ACCUPRIL) 40 MG tablet Take 40 mg by mouth daily.    Marland Kitchen rOPINIRole (REQUIP) 1 MG tablet Take 1 mg by mouth at bedtime.     Marland Kitchen zolpidem (AMBIEN) 10 MG tablet Take 5-10 mg by mouth at bedtime as needed for sleep.     Marland Kitchen acetaminophen (TYLENOL) 500 MG tablet Take 500 mg by mouth daily.    Marland Kitchen aspirin EC 81 MG tablet Take 1 tablet (81 mg total) by mouth daily. (Patient not taking: Reported on 10/03/2016) 90 tablet 3  . Cholecalciferol (VITAMIN D PO) Take 1 capsule by mouth at bedtime. Take on    . HYDROcodone-acetaminophen (NORCO/VICODIN) 5-325 MG per tablet Take 1 tablet by mouth every 6 (six) hours as needed for pain.    . methocarbamol (ROBAXIN) 500 MG tablet Take 1 tablet (500 mg total) by mouth every 6 (six) hours as needed for muscle spasms. (Patient not taking: Reported on 10/03/2016) 30 tablet 0  . ondansetron (ZOFRAN) 4 MG tablet Take 1 tablet (4 mg total) by mouth every 8 (eight) hours as needed for nausea. (Patient not taking: Reported on 10/03/2016) 20 tablet 1  . oxyCODONE (OXY IR/ROXICODONE) 5 MG immediate release tablet Take 1-2 tablets (5-10 mg total) by mouth every 6 (six) hours as needed for moderate pain,  severe pain or breakthrough pain. (Patient not taking: Reported on 10/03/2016) 20 tablet 0  . valsartan (DIOVAN) 320 MG tablet Take 320 mg by mouth at bedtime.  5   Current Facility-Administered Medications  Medication Dose Route Frequency Provider Last Rate Last Dose  . 0.9 %  sodium chloride infusion  500 mL Intravenous Continuous Jerene Bears, MD        REVIEW OF SYSTEMS:  [X]  denotes positive finding, [ ]   denotes negative finding Cardiac  Comments:  Chest pain or chest pressure:    Shortness of breath upon exertion: x   Short of breath when lying flat:    Irregular heart rhythm:        Vascular    Pain in calf, thigh, or hip brought on by ambulation: x Arthritis   Pain in feet at night that wakes you up from your sleep:     Blood clot in your veins:    Leg swelling:         Pulmonary    Oxygen at home:    Productive cough:     Wheezing:         Neurologic    Sudden weakness in arms or legs:     Sudden numbness in arms or legs:     Sudden onset of difficulty speaking or slurred speech:    Temporary loss of vision in one eye:  x   Problems with dizziness:         Gastrointestinal    Blood in stool:     Vomited blood:         Genitourinary    Burning when urinating:     Blood in urine:        Psychiatric    Major depression:         Hematologic    Bleeding problems:    Problems with blood clotting too easily:        Skin    Rashes or ulcers:        Constitutional    Fever or chills:      PHYSICAL EXAM: Vitals:   10/03/16 1032 10/03/16 1035  BP: (!) 154/89 (!) 151/77  Pulse: 72   Resp: 18   Temp: 97.5 F (36.4 C)   TempSrc: Oral   SpO2: 99%   Weight: 121 lb 9.6 oz (55.2 kg)   Height: 5' 4.5" (1.638 m)     GENERAL: The patient is a well-nourished female, in no acute distress. The vital signs are documented above. CARDIOVASCULAR: Carotid arteries without bruits bilaterally. 2+ radial and 2+ dorsalis pedis pulses bilaterally PULMONARY: There is good  air exchange  ABDOMEN: Soft and non-tender  MUSCULOSKELETAL: There are no major deformities or cyanosis. NEUROLOGIC: No focal weakness or paresthesias are detected. SKIN: There are no ulcers or rashes noted. Does have an area of blood under the skin approximately 2 x 3 cm over her lateral calf small area just below this on the right. Does have skeletal scattered reticular veins over both lower extremities and also telangiectasia PSYCHIATRIC: The patient has a normal affect.  DATA:  Venous duplex today showed no evidence of significant deep or superficial reflux. There is a trivial reflux in her left common femoral and popliteal vein and in the right common femoral vein only. No dilatation reflux in her surface veins  MEDICAL ISSUES: I discussed this at length with the patient and her husband present. I do not see any evidence of arterial or venous pathology to explain her leg discomfort. She is contemplating a total knee replacement on the right and I do not see any vascular contraindications to this. She was reassured with this discussion will see [place] again on an as-needed basis   Rosetta Posner, MD [facility] Tel [phone] Pager [phone]

## 2016-10-05 ENCOUNTER — Encounter: Payer: Self-pay | Admitting: Internal Medicine

## 2016-10-16 ENCOUNTER — Encounter: Payer: Medicare Other | Admitting: Vascular Surgery

## 2016-11-30 ENCOUNTER — Encounter (HOSPITAL_COMMUNITY): Payer: Self-pay

## 2016-11-30 ENCOUNTER — Emergency Department (HOSPITAL_COMMUNITY)
Admission: EM | Admit: 2016-11-30 | Discharge: 2016-12-01 | Disposition: A | Discharge status: Home / Self Care | Payer: Medicare Other | Attending: Emergency Medicine | Admitting: Emergency Medicine

## 2016-11-30 DIAGNOSIS — Z87891 Personal history of nicotine dependence: Secondary | ICD-10-CM | POA: Insufficient documentation

## 2016-11-30 DIAGNOSIS — J449 Chronic obstructive pulmonary disease, unspecified: Secondary | ICD-10-CM | POA: Insufficient documentation

## 2016-11-30 DIAGNOSIS — E039 Hypothyroidism, unspecified: Secondary | ICD-10-CM | POA: Diagnosis not present

## 2016-11-30 DIAGNOSIS — Y929 Unspecified place or not applicable: Secondary | ICD-10-CM | POA: Diagnosis not present

## 2016-11-30 DIAGNOSIS — Z8673 Personal history of transient ischemic attack (TIA), and cerebral infarction without residual deficits: Secondary | ICD-10-CM | POA: Insufficient documentation

## 2016-11-30 DIAGNOSIS — S0083XA Contusion of other part of head, initial encounter: Secondary | ICD-10-CM | POA: Insufficient documentation

## 2016-11-30 DIAGNOSIS — N189 Chronic kidney disease, unspecified: Secondary | ICD-10-CM | POA: Insufficient documentation

## 2016-11-30 DIAGNOSIS — Y999 Unspecified external cause status: Secondary | ICD-10-CM | POA: Insufficient documentation

## 2016-11-30 DIAGNOSIS — I129 Hypertensive chronic kidney disease with stage 1 through stage 4 chronic kidney disease, or unspecified chronic kidney disease: Secondary | ICD-10-CM | POA: Diagnosis not present

## 2016-11-30 DIAGNOSIS — Y9389 Activity, other specified: Secondary | ICD-10-CM | POA: Insufficient documentation

## 2016-11-30 DIAGNOSIS — S0003XA Contusion of scalp, initial encounter: Secondary | ICD-10-CM | POA: Diagnosis not present

## 2016-11-30 DIAGNOSIS — S8001XA Contusion of right knee, initial encounter: Secondary | ICD-10-CM | POA: Insufficient documentation

## 2016-11-30 DIAGNOSIS — M25561 Pain in right knee: Secondary | ICD-10-CM | POA: Diagnosis not present

## 2016-11-30 DIAGNOSIS — S0990XA Unspecified injury of head, initial encounter: Secondary | ICD-10-CM | POA: Insufficient documentation

## 2016-11-30 DIAGNOSIS — M542 Cervicalgia: Secondary | ICD-10-CM | POA: Diagnosis not present

## 2016-11-30 DIAGNOSIS — S41111A Laceration without foreign body of right upper arm, initial encounter: Secondary | ICD-10-CM | POA: Insufficient documentation

## 2016-11-30 DIAGNOSIS — M7989 Other specified soft tissue disorders: Secondary | ICD-10-CM | POA: Diagnosis not present

## 2016-11-30 DIAGNOSIS — S199XXA Unspecified injury of neck, initial encounter: Secondary | ICD-10-CM | POA: Diagnosis not present

## 2016-11-30 DIAGNOSIS — W01198A Fall on same level from slipping, tripping and stumbling with subsequent striking against other object, initial encounter: Secondary | ICD-10-CM | POA: Insufficient documentation

## 2016-11-30 NOTE — ED Triage Notes (Signed)
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 Pt has a large hematoma to the right side of her head. Denies LOC. Not on blood thinners. Pt hypertensive in triage. Neuro intact. PERRL. Pt is on a 100 fentanyl patch for back problems.

## 2016-11-30 NOTE — ED Provider Notes (Signed)
TIME SEEN: 12:10 AM By signing my name below, I, Arianna Nassar, attest that this documentation has been prepared under the direction and in the presence of [employer], DO.  Electronically Signed: Julien Nordmann, ED Scribe. 12/01/16. 12:19 AM.  CHIEF COMPLAINT:  Chief Complaint  Patient presents with  . Fall  . Head Injury     HPI:  HPI Comments: [name] is a 71 y.o. female who has a PMhx of CKD, COPD, GERD, HTN, hypercholesterolemia, hypothyroid, osteoporosis, and liver hemangioma presents to the Emergency Department complaining of a moderate, progressively worsening headache s/p a fall that occurred earlier this evening. She has a mild skin tear to her right upper forearm and a large hematoma to the right forehead. 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 Pt hit her head and right knee on the floor but she did not lose consciousness. She notes her right knee does not have any current pain, but states that she has some issues with her knee at baseline secondary to arthritis. Pt notes she was able to ambulate after the fall occurred without any difficulty. Pt is on one 81 mg of ASA daily and is currently on 100 mg of fentanyl patch for chronic back pain. She is up to date on her tetanus shot, stating that she received one in November 2017. Pt is not on any anticoagulants. Pt denies nausea, vomiting, confusion, numbness, tingling, weakness, chest pain, abdominal pain, and any new back pain.   ROS: See HPI Constitutional: no fever  Eyes: no drainage  ENT: no runny nose   Cardiovascular:  no chest pain  Resp: no SOB  GI: no vomiting GU: no dysuria Integumentary: no rash  Allergy: no hives  Musculoskeletal: no leg swelling  Neurological: no slurred speech ROS otherwise negative  PAST MEDICAL HISTORY/PAST SURGICAL HISTORY:  Past Medical History:  Diagnosis Date  . Allergy   . Arthritis    back, hands, feet , ankles , legs (06/28/2016)   . Cataract    removed both eyes  . 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 Chronic lower back pain   . COPD (chronic obstructive pulmonary disease) ([place])   . Depression   . GERD (gastroesophageal reflux disease)   . 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 HTN (hypertension)   . Hypercholesterolemia   . Hypothyroid   . Irritable bowel   . Liver hemangioma   . Migraine 1990s  . Osteoporosis   . 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 Authorizing Provider  acetaminophen (TYLENOL) 500 MG tablet Take 500 mg by mouth daily.    Historical Provider, MD  aspirin EC 81 MG tablet Take 1 tablet (81 mg total) by mouth daily. Patient not taking: Reported on 10/03/2016 05/28/13   Peter M [place], MD  atorvastatin (LIPITOR) 10 MG tablet Take 10 mg by mouth daily. 04/26/16   Historical Provider, MD  buPROPion (WELLBUTRIN SR) 150 MG 12 hr tablet Take 150 mg by mouth daily. 05/04/16   Historical Provider, MD  Cholecalciferol (VITAMIN D PO) Take 1 capsule by mouth at bedtime. Take on    Historical Provider, MD  felodipine (PLENDIL) 5 MG 24 hr tablet Take  5 mg by mouth at bedtime.     Historical Provider, MD  fentaNYL (DURAGESIC - DOSED MCG/HR) 100 MCG/HR Place 1 patch onto the skin every other day.     Historical Provider, MD  fluconazole (DIFLUCAN) 100 MG tablet 200 mg by mouth X 1 day and then 100 mg by mouth daily for 13 days. 05/25/16   Jerene Bears, MD  FLUoxetine (PROZAC) 20 MG capsule Take 20 mg by mouth at bedtime.     Historical Provider, MD  fluticasone (FLONASE) 50 MCG/ACT nasal spray Place into both nostrils daily.    Historical Provider, MD  HYDROcodone-acetaminophen (NORCO/VICODIN) 5-325 MG per tablet Take 1 tablet by mouth every 6 (six) hours as needed for pain.    Historical Provider, MD   HYDROcodone-acetaminophen (NORCO/VICODIN) 5-325 MG tablet Take 1-2 tablets by mouth every 4 (four) hours as needed for moderate pain. 07/03/16   Rolm Bookbinder, MD  hyoscyamine (LEVSIN/SL) 0.125 MG SL tablet Take 1-2 tablets by mouth every 6 hours as needed for abdominal cramping 04/26/16   Jerene Bears, MD  levothyroxine (SYNTHROID, LEVOTHROID) 100 MCG tablet Take 100 mcg by mouth daily before breakfast.    Historical Provider, MD  methocarbamol (ROBAXIN) 500 MG tablet Take 1 tablet (500 mg total) by mouth every 6 (six) hours as needed for muscle spasms. Patient not taking: Reported on 10/03/2016 07/03/16   Rolm Bookbinder, MD  omeprazole (PRILOSEC) 40 MG capsule Take 40 mg by mouth 2 (two) times daily.    Historical Provider, MD  ondansetron (ZOFRAN ODT) 4 MG disintegrating tablet Take 1-2 tablets by mouth every 8 hours as needed for nausea 06/09/16   Jerene Bears, MD  ondansetron (ZOFRAN) 4 MG tablet Take 1 tablet (4 mg total) by mouth every 8 (eight) hours as needed for nausea. Patient not taking: Reported on 10/03/2016 07/03/16   Rolm Bookbinder, MD  oxyCODONE (OXY IR/ROXICODONE) 5 MG immediate release tablet Take 1-2 tablets (5-10 mg total) by mouth every 6 (six) hours as needed for moderate pain, severe pain or breakthrough pain. Patient not taking: Reported on 10/03/2016 06/28/16   Rolm Bookbinder, MD  quinapril (ACCUPRIL) 40 MG tablet Take 40 mg by mouth daily.    Historical Provider, MD  rOPINIRole (REQUIP) 1 MG tablet Take 1 mg by mouth at bedtime.     Historical Provider, MD  valsartan (DIOVAN) 320 MG tablet Take 320 mg by mouth at bedtime. 06/19/16   Historical Provider, MD  zolpidem (AMBIEN) 10 MG tablet Take 5-10 mg by mouth at bedtime as needed for sleep.     Historical Provider, MD    ALLERGIES:  Allergies  Allergen Reactions  . Penicillins     Causes rash Has patient had a PCN reaction causing immediate rash, facial/tongue/throat swelling, SOB or lightheadedness with  hypotension: No Has patient had a PCN reaction causing severe rash involving mucus membranes or skin necrosis: No Has patient had a PCN reaction that required hospitalization No Has patient had a PCN reaction occurring within the last 10 years: No If all of the above answers are "NO", then may proceed with Cephalosporin use.     SOCIAL HISTORY:  Social History  Substance Use Topics  . Smoking status: Former Smoker    Packs/day: 1.00    Years: 40.00    Types: Cigarettes    Quit date: 2001  . Smokeless tobacco: Never Used  . Alcohol use No    FAMILY HISTORY: Family History  Problem Relation Age of Onset  .  Heart disease Father   . Hypertension Father   . Heart disease Sister     valve surgery  . Colon cancer Neg Hx   . Colon polyps Neg Hx   . Esophageal cancer Neg Hx   . Rectal cancer Neg Hx   . Stomach cancer Neg Hx     EXAM: BP (!) 172/93 (BP Location: Left Arm)   Pulse 93   Temp 98.5 F (36.9 C) (Oral)   Resp 18   Ht 5' 6.5" (1.689 m)   Wt 120 lb (54.4 kg)   SpO2 96%   BMI 19.08 kg/m  CONSTITUTIONAL: Alert and oriented and responds appropriately to questions. Well-appearing; well-nourished; GCS 15, Elderly, in no significant distress HEAD: Normocephalic; hematoma to the right forehead with associated abrasion EYES: Conjunctivae clear, PERRL, EOMI ENT: normal nose; no rhinorrhea; moist mucous membranes; pharynx without lesions noted; no dental injury; no septal hematoma NECK: Supple, no meningismus, no LAD; no midline spinal tenderness, step-off or deformity; trachea midline CARD: RRR; S1 and S2 appreciated; no murmurs, no clicks, no rubs, no gallops RESP: Normal chest excursion without splinting or tachypnea; breath sounds clear and equal bilaterally; no wheezes, no rhonchi, no rales; no hypoxia or respiratory distress CHEST:  chest wall stable, no crepitus or ecchymosis or deformity, nontender to palpation; no flail chest ABD/GI: Normal bowel sounds;  non-distended; soft, non-tender, no rebound, no guarding; no ecchymosis or other lesions noted PELVIS:  stable, nontender to palpation BACK:  The back appears normal and is non-tender to palpation, there is no CVA tenderness; no midline spinal tenderness, step-off or deformity EXT: mild tenderness, bruising and swelling to the right knee; Normal ROM in all joints;  normal capillary refill; no cyanosis, otherwise no bony tenderness or bony deformity of patient's extremities, no joint effusion, compartments are soft, extremities are warm and well-perfused SKIN: skin tears to the right arm around the elbow without laceration; normal color for age and race; warm NEURO: Moves all extremities equally PSYCH: The patient's mood and manner are appropriate. Grooming and personal hygiene are appropriate.  MEDICAL DECISION MAKING: Patient here with mechanical fall. Hematoma to the right forehead, small skin tears to the right arm and right knee pain. Tetanus vaccination is up-to-date. We'll give Vicodin for pain control. She does currently have a fentanyl patch in place. We'll obtain CTs of her head, cervical spine and x-ray of the right knee. No other sign of trauma on exam. Neurologically intact. Not on anticoagulation.  ED PROGRESS: Imaging unremarkable for acute injury. Patient reports feeling better after Vicodin. I feel she is safe to be discharged home with her husband. Discussed head injury return precautions. Recommended ice to her knee. We'll discharge with course of Vicodin for increased pain control if the sentinel patch is not enough for her pain. Patient does have a PCP for follow-up. Patient comfortable with this plan.   At this time, I do not feel there is any life-threatening condition present. I have reviewed and discussed all results (EKG, imaging, lab, urine as appropriate) and exam findings with patient/family. I have reviewed nursing notes and appropriate previous records.  I feel the patient  is safe to be discharged home without further emergent workup and can continue workup as an outpatient as needed. Discussed usual and customary return precautions. Patient/family verbalize understanding and are comfortable with this plan.  Outpatient follow-up has been provided if needed. All questions have been answered.     I personally performed the services described in this  documentation, which was scribed in my presence. The recorded information has been reviewed and is accurate.    [place], DO 12/01/16 [phone]

## 2016-12-01 ENCOUNTER — Emergency Department (HOSPITAL_COMMUNITY): Payer: Medicare Other

## 2016-12-01 DIAGNOSIS — M7989 Other specified soft tissue disorders: Secondary | ICD-10-CM | POA: Diagnosis not present

## 2016-12-01 DIAGNOSIS — S199XXA Unspecified injury of neck, initial encounter: Secondary | ICD-10-CM | POA: Diagnosis not present

## 2016-12-01 DIAGNOSIS — S0003XA Contusion of scalp, initial encounter: Secondary | ICD-10-CM | POA: Diagnosis not present

## 2016-12-01 DIAGNOSIS — M542 Cervicalgia: Secondary | ICD-10-CM | POA: Diagnosis not present

## 2016-12-01 DIAGNOSIS — S41111A Laceration without foreign body of right upper arm, initial encounter: Secondary | ICD-10-CM | POA: Diagnosis not present

## 2016-12-01 DIAGNOSIS — M25561 Pain in right knee: Secondary | ICD-10-CM | POA: Diagnosis not present

## 2016-12-01 MED ORDER — HYDROCODONE-ACETAMINOPHEN 5-325 MG PO TABS: 1.0000 | ORAL_TABLET | Freq: Four times a day (QID) | ORAL | 0 refills | Status: DC | PRN

## 2016-12-01 MED ORDER — HYDROCODONE-ACETAMINOPHEN 5-325 MG PO TABS
1.0000 | ORAL_TABLET | Freq: Once | ORAL | Status: AC
  Administered 2016-12-01: 1 via ORAL
  Filled 2016-12-01: qty 1

## 2016-12-01 NOTE — ED Notes (Signed)
Pt states she flipped in some grease and fell head first. Pt is not sure where she hit her head at; either floor or cabinet. Pt denies LOC. Pt complains of pain to the head.

## 2016-12-01 NOTE — ED Notes (Signed)
Pt returned from CT and connected to the monitor 

## 2016-12-13 DIAGNOSIS — S0080XA Unspecified superficial injury of other part of head, initial encounter: Secondary | ICD-10-CM | POA: Diagnosis not present

## 2016-12-13 DIAGNOSIS — Z6822 Body mass index (BMI) 22.0-22.9, adult: Secondary | ICD-10-CM | POA: Diagnosis not present

## 2017-01-03 DIAGNOSIS — R8299 Other abnormal findings in urine: Secondary | ICD-10-CM | POA: Diagnosis not present

## 2017-01-03 DIAGNOSIS — Z6821 Body mass index (BMI) 21.0-21.9, adult: Secondary | ICD-10-CM | POA: Diagnosis not present

## 2017-01-03 DIAGNOSIS — E784 Other hyperlipidemia: Secondary | ICD-10-CM | POA: Diagnosis not present

## 2017-01-03 DIAGNOSIS — N390 Urinary tract infection, site not specified: Secondary | ICD-10-CM | POA: Diagnosis not present

## 2017-01-03 DIAGNOSIS — E038 Other specified hypothyroidism: Secondary | ICD-10-CM | POA: Diagnosis not present

## 2017-01-03 DIAGNOSIS — M859 Disorder of bone density and structure, unspecified: Secondary | ICD-10-CM | POA: Diagnosis not present

## 2017-01-03 DIAGNOSIS — I10 Essential (primary) hypertension: Secondary | ICD-10-CM | POA: Diagnosis not present

## 2017-01-03 DIAGNOSIS — M5489 Other dorsalgia: Secondary | ICD-10-CM | POA: Diagnosis not present

## 2017-01-03 DIAGNOSIS — M546 Pain in thoracic spine: Secondary | ICD-10-CM | POA: Diagnosis not present

## 2017-02-05 ENCOUNTER — Emergency Department (HOSPITAL_COMMUNITY): Payer: Medicare Other

## 2017-02-05 ENCOUNTER — Encounter (HOSPITAL_COMMUNITY): Payer: Self-pay | Admitting: Emergency Medicine

## 2017-02-05 ENCOUNTER — Emergency Department (HOSPITAL_COMMUNITY)
Admission: EM | Admit: 2017-02-05 | Discharge: 2017-02-05 | Disposition: A | Discharge status: Home / Self Care | Payer: Medicare Other | Attending: Emergency Medicine | Admitting: Emergency Medicine

## 2017-02-05 DIAGNOSIS — S0101XA Laceration without foreign body of scalp, initial encounter: Secondary | ICD-10-CM | POA: Diagnosis not present

## 2017-02-05 DIAGNOSIS — S0990XA Unspecified injury of head, initial encounter: Secondary | ICD-10-CM | POA: Diagnosis present

## 2017-02-05 DIAGNOSIS — Z79899 Other long term (current) drug therapy: Secondary | ICD-10-CM | POA: Diagnosis not present

## 2017-02-05 DIAGNOSIS — Y929 Unspecified place or not applicable: Secondary | ICD-10-CM | POA: Diagnosis not present

## 2017-02-05 DIAGNOSIS — S0181XA Laceration without foreign body of other part of head, initial encounter: Secondary | ICD-10-CM | POA: Diagnosis not present

## 2017-02-05 DIAGNOSIS — W19XXXA Unspecified fall, initial encounter: Secondary | ICD-10-CM

## 2017-02-05 DIAGNOSIS — Z87891 Personal history of nicotine dependence: Secondary | ICD-10-CM | POA: Insufficient documentation

## 2017-02-05 DIAGNOSIS — Y999 Unspecified external cause status: Secondary | ICD-10-CM | POA: Insufficient documentation

## 2017-02-05 DIAGNOSIS — J449 Chronic obstructive pulmonary disease, unspecified: Secondary | ICD-10-CM | POA: Diagnosis not present

## 2017-02-05 DIAGNOSIS — W0110XA Fall on same level from slipping, tripping and stumbling with subsequent striking against unspecified object, initial encounter: Secondary | ICD-10-CM | POA: Insufficient documentation

## 2017-02-05 DIAGNOSIS — S098XXA Other specified injuries of head, initial encounter: Secondary | ICD-10-CM | POA: Diagnosis not present

## 2017-02-05 DIAGNOSIS — N189 Chronic kidney disease, unspecified: Secondary | ICD-10-CM | POA: Diagnosis not present

## 2017-02-05 DIAGNOSIS — Y939 Activity, unspecified: Secondary | ICD-10-CM | POA: Diagnosis not present

## 2017-02-05 DIAGNOSIS — I129 Hypertensive chronic kidney disease with stage 1 through stage 4 chronic kidney disease, or unspecified chronic kidney disease: Secondary | ICD-10-CM | POA: Insufficient documentation

## 2017-02-05 DIAGNOSIS — S0190XA Unspecified open wound of unspecified part of head, initial encounter: Secondary | ICD-10-CM | POA: Diagnosis not present

## 2017-02-05 MED ORDER — LIDOCAINE-EPINEPHRINE (PF) 2 %-1:200000 IJ SOLN
10.0000 mL | Freq: Once | INTRAMUSCULAR | Status: AC
  Administered 2017-02-05: 10 mL
  Filled 2017-02-05: qty 20

## 2017-02-05 NOTE — ED Notes (Signed)
Patient transported to CT 

## 2017-02-05 NOTE — ED Triage Notes (Signed)
Per EMS [place] she tri[place] over a rug and hit her head on the side of a dryer. Pt denies LOC. NAD.

## 2017-02-05 NOTE — ED Notes (Signed)
Pt ambulatory to waiting room. Pt verbalized understanding of discharge instructions.   

## 2017-02-05 NOTE — Discharge Instructions (Signed)
[facility] out 7-10 days. Return if any worse- especially confusion, weakness, or worsening headache

## 2017-02-05 NOTE — ED Notes (Signed)
Pt ambulatory to restroom
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
## 2017-04-04 ENCOUNTER — Other Ambulatory Visit: Payer: Self-pay | Admitting: Internal Medicine

## 2017-04-04 DIAGNOSIS — R2689 Other abnormalities of gait and mobility: Secondary | ICD-10-CM

## 2017-04-04 DIAGNOSIS — M5416 Radiculopathy, lumbar region: Secondary | ICD-10-CM

## 2017-04-18 ENCOUNTER — Other Ambulatory Visit: Payer: Medicare Other

## 2017-04-26 ENCOUNTER — Ambulatory Visit
Admission: RE | Admit: 2017-04-26 | Discharge: 2017-04-26 | Disposition: A | Discharge status: Home / Self Care | Payer: Medicare Other | Source: Ambulatory Visit | Attending: Internal Medicine | Admitting: Internal Medicine

## 2017-04-26 DIAGNOSIS — M5416 Radiculopathy, lumbar region: Secondary | ICD-10-CM

## 2017-04-26 DIAGNOSIS — M48061 Spinal stenosis, lumbar region without neurogenic claudication: Secondary | ICD-10-CM | POA: Diagnosis not present

## 2017-04-26 DIAGNOSIS — R2689 Other abnormalities of gait and mobility: Secondary | ICD-10-CM

## 2017-06-26 ENCOUNTER — Emergency Department (HOSPITAL_COMMUNITY): Payer: Medicare Other

## 2017-06-26 ENCOUNTER — Encounter (HOSPITAL_COMMUNITY): Payer: Self-pay

## 2017-06-26 ENCOUNTER — Inpatient Hospital Stay (HOSPITAL_COMMUNITY)
Admission: EM | Admit: 2017-06-26 | Discharge: 2017-06-30 | DRG: 470 | Disposition: A | Discharge status: Home Health | Payer: Medicare Other | Attending: Nephrology | Admitting: Nephrology

## 2017-06-26 ENCOUNTER — Other Ambulatory Visit: Payer: Self-pay

## 2017-06-26 DIAGNOSIS — I4510 Unspecified right bundle-branch block: Secondary | ICD-10-CM | POA: Diagnosis present

## 2017-06-26 DIAGNOSIS — E7800 Pure hypercholesterolemia, unspecified: Secondary | ICD-10-CM | POA: Diagnosis present

## 2017-06-26 DIAGNOSIS — Z79891 Long term (current) use of opiate analgesic: Secondary | ICD-10-CM

## 2017-06-26 DIAGNOSIS — Y92009 Unspecified place in unspecified non-institutional (private) residence as the place of occurrence of the external cause: Secondary | ICD-10-CM

## 2017-06-26 DIAGNOSIS — Z9841 Cataract extraction status, right eye: Secondary | ICD-10-CM

## 2017-06-26 DIAGNOSIS — S299XXA Unspecified injury of thorax, initial encounter: Secondary | ICD-10-CM | POA: Diagnosis not present

## 2017-06-26 DIAGNOSIS — I129 Hypertensive chronic kidney disease with stage 1 through stage 4 chronic kidney disease, or unspecified chronic kidney disease: Secondary | ICD-10-CM | POA: Diagnosis present

## 2017-06-26 DIAGNOSIS — Z7901 Long term (current) use of anticoagulants: Secondary | ICD-10-CM

## 2017-06-26 DIAGNOSIS — I480 Paroxysmal atrial fibrillation: Secondary | ICD-10-CM | POA: Diagnosis present

## 2017-06-26 DIAGNOSIS — I481 Persistent atrial fibrillation: Secondary | ICD-10-CM | POA: Diagnosis not present

## 2017-06-26 DIAGNOSIS — M25561 Pain in right knee: Secondary | ICD-10-CM | POA: Diagnosis present

## 2017-06-26 DIAGNOSIS — Z8673 Personal history of transient ischemic attack (TIA), and cerebral infarction without residual deficits: Secondary | ICD-10-CM

## 2017-06-26 DIAGNOSIS — S72011A Unspecified intracapsular fracture of right femur, initial encounter for closed fracture: Principal | ICD-10-CM | POA: Diagnosis present

## 2017-06-26 DIAGNOSIS — E785 Hyperlipidemia, unspecified: Secondary | ICD-10-CM | POA: Diagnosis present

## 2017-06-26 DIAGNOSIS — G894 Chronic pain syndrome: Secondary | ICD-10-CM | POA: Diagnosis present

## 2017-06-26 DIAGNOSIS — K5900 Constipation, unspecified: Secondary | ICD-10-CM | POA: Diagnosis present

## 2017-06-26 DIAGNOSIS — S72001A Fracture of unspecified part of neck of right femur, initial encounter for closed fracture: Secondary | ICD-10-CM | POA: Diagnosis not present

## 2017-06-26 DIAGNOSIS — S72009A Fracture of unspecified part of neck of unspecified femur, initial encounter for closed fracture: Secondary | ICD-10-CM | POA: Diagnosis present

## 2017-06-26 DIAGNOSIS — I4891 Unspecified atrial fibrillation: Secondary | ICD-10-CM | POA: Diagnosis not present

## 2017-06-26 DIAGNOSIS — F329 Major depressive disorder, single episode, unspecified: Secondary | ICD-10-CM | POA: Diagnosis not present

## 2017-06-26 DIAGNOSIS — J449 Chronic obstructive pulmonary disease, unspecified: Secondary | ICD-10-CM | POA: Diagnosis present

## 2017-06-26 DIAGNOSIS — Z880 Allergy status to penicillin: Secondary | ICD-10-CM

## 2017-06-26 DIAGNOSIS — M19011 Primary osteoarthritis, right shoulder: Secondary | ICD-10-CM | POA: Diagnosis present

## 2017-06-26 DIAGNOSIS — Z79899 Other long term (current) drug therapy: Secondary | ICD-10-CM

## 2017-06-26 DIAGNOSIS — W07XXXA Fall from chair, initial encounter: Secondary | ICD-10-CM | POA: Diagnosis present

## 2017-06-26 DIAGNOSIS — Z90710 Acquired absence of both cervix and uterus: Secondary | ICD-10-CM

## 2017-06-26 DIAGNOSIS — Z905 Acquired absence of kidney: Secondary | ICD-10-CM

## 2017-06-26 DIAGNOSIS — E039 Hypothyroidism, unspecified: Secondary | ICD-10-CM

## 2017-06-26 DIAGNOSIS — Z79890 Hormone replacement therapy (postmenopausal): Secondary | ICD-10-CM

## 2017-06-26 DIAGNOSIS — Z87891 Personal history of nicotine dependence: Secondary | ICD-10-CM

## 2017-06-26 DIAGNOSIS — M810 Age-related osteoporosis without current pathological fracture: Secondary | ICD-10-CM | POA: Diagnosis present

## 2017-06-26 DIAGNOSIS — G2581 Restless legs syndrome: Secondary | ICD-10-CM | POA: Diagnosis present

## 2017-06-26 DIAGNOSIS — Z961 Presence of intraocular lens: Secondary | ICD-10-CM | POA: Diagnosis present

## 2017-06-26 DIAGNOSIS — S79911A Unspecified injury of right hip, initial encounter: Secondary | ICD-10-CM | POA: Diagnosis not present

## 2017-06-26 DIAGNOSIS — I10 Essential (primary) hypertension: Secondary | ICD-10-CM | POA: Diagnosis not present

## 2017-06-26 DIAGNOSIS — M25551 Pain in right hip: Secondary | ICD-10-CM | POA: Diagnosis not present

## 2017-06-26 DIAGNOSIS — N183 Chronic kidney disease, stage 3 (moderate): Secondary | ICD-10-CM | POA: Diagnosis present

## 2017-06-26 DIAGNOSIS — K219 Gastro-esophageal reflux disease without esophagitis: Secondary | ICD-10-CM | POA: Diagnosis present

## 2017-06-26 DIAGNOSIS — I4819 Other persistent atrial fibrillation: Secondary | ICD-10-CM | POA: Diagnosis present

## 2017-06-26 DIAGNOSIS — Z9842 Cataract extraction status, left eye: Secondary | ICD-10-CM

## 2017-06-26 DIAGNOSIS — I499 Cardiac arrhythmia, unspecified: Secondary | ICD-10-CM

## 2017-06-26 DIAGNOSIS — Z8249 Family history of ischemic heart disease and other diseases of the circulatory system: Secondary | ICD-10-CM

## 2017-06-26 DIAGNOSIS — Z981 Arthrodesis status: Secondary | ICD-10-CM

## 2017-06-26 HISTORY — DX: Other persistent atrial fibrillation: I48.19

## 2017-06-26 LAB — BASIC METABOLIC PANEL
Anion gap: 11 (ref 5–15)
BUN: 13 mg/dL (ref 6–20)
CO2: 29 mmol/L (ref 22–32)
Calcium: 9.5 mg/dL (ref 8.9–10.3)
Chloride: 100 mmol/L — ABNORMAL LOW (ref 101–111)
Creatinine, Ser: 0.94 mg/dL (ref 0.44–1.00)
GFR calc Af Amer: >60 mL/min (ref >60)
GFR calc non Af Amer: 59 mL/min — ABNORMAL LOW (ref >60)
Glucose, Bld: 106 mg/dL — ABNORMAL HIGH (ref 65–99)
Potassium: 4.2 mmol/L (ref 3.5–5.1)
Sodium: 140 mmol/L (ref 135–145)

## 2017-06-26 LAB — CBC
HCT: 38.8 % (ref 36.0–46.0)
Hemoglobin: 12.7 g/dL (ref 12.0–15.0)
MCH: 29.5 pg (ref 26.0–34.0)
MCHC: 32.7 g/dL (ref 30.0–36.0)
MCV: 90.2 fL (ref 78.0–100.0)
Platelets: 308 10*3/uL (ref 150–400)
RBC: 4.3 MIL/uL (ref 3.87–5.11)
RDW: 12.9 % (ref 11.5–15.5)
WBC: 7.3 10*3/uL (ref 4.0–10.5)

## 2017-06-26 LAB — TYPE AND SCREEN
ABO/RH(D): A POS
Antibody Screen: NEGATIVE

## 2017-06-26 LAB — PROTIME-INR
INR: 1.05
Prothrombin Time: 13.6 seconds (ref 11.4–15.2)

## 2017-06-26 LAB — ABO/RH: ABO/RH(D): A POS

## 2017-06-26 MED ORDER — HYDROCODONE-ACETAMINOPHEN 5-325 MG PO TABS
1.0000 | ORAL_TABLET | Freq: Four times a day (QID) | ORAL | Status: DC | PRN
  Administered 2017-06-27: 2 via ORAL
  Filled 2017-06-26 (×2): qty 2

## 2017-06-26 MED ORDER — HEPARIN SODIUM (PORCINE) 5000 UNIT/ML IJ SOLN
5000.0000 [IU] | Freq: Three times a day (TID) | INTRAMUSCULAR | Status: DC
  Administered 2017-06-26: 5000 [IU] via SUBCUTANEOUS
  Filled 2017-06-26 (×2): qty 1

## 2017-06-26 MED ORDER — FLUOXETINE HCL 20 MG PO CAPS
20.0000 mg | ORAL_CAPSULE | Freq: Every day | ORAL | Status: DC
  Administered 2017-06-27 – 2017-06-29 (×3): 20 mg via ORAL
  Filled 2017-06-26 (×4): qty 1

## 2017-06-26 MED ORDER — MORPHINE SULFATE (PF) 4 MG/ML IV SOLN
0.5000 mg | INTRAVENOUS | Status: DC | PRN
  Administered 2017-06-26 – 2017-06-27 (×5): 0.52 mg via INTRAVENOUS
  Filled 2017-06-26 (×5): qty 1

## 2017-06-26 MED ORDER — METHOCARBAMOL 1000 MG/10ML IJ SOLN
500.0000 mg | Freq: Four times a day (QID) | INTRAMUSCULAR | Status: DC | PRN
  Filled 2017-06-26: qty 5

## 2017-06-26 MED ORDER — LEVOTHYROXINE SODIUM 88 MCG PO TABS
88.0000 ug | ORAL_TABLET | Freq: Every day | ORAL | Status: DC
  Administered 2017-06-27 – 2017-06-30 (×4): 88 ug via ORAL
  Filled 2017-06-26 (×4): qty 1

## 2017-06-26 MED ORDER — HYDROCODONE-ACETAMINOPHEN 5-325 MG PO TABS: 1.0000 | ORAL_TABLET | Freq: Once | ORAL | Status: DC

## 2017-06-26 MED ORDER — FENTANYL 100 MCG/HR TD PT72
100.0000 ug | MEDICATED_PATCH | TRANSDERMAL | Status: DC
  Administered 2017-06-26 – 2017-06-27 (×2): 100 ug via TRANSDERMAL
  Filled 2017-06-26 (×2): qty 1

## 2017-06-26 MED ORDER — LISINOPRIL 10 MG PO TABS
40.0000 mg | ORAL_TABLET | Freq: Every day | ORAL | Status: DC
  Administered 2017-06-26 – 2017-06-28 (×2): 40 mg via ORAL
  Filled 2017-06-26 (×2): qty 2
  Filled 2017-06-26: qty 4

## 2017-06-26 MED ORDER — LORATADINE 10 MG PO TABS
10.0000 mg | ORAL_TABLET | Freq: Every day | ORAL | Status: DC
  Administered 2017-06-26 – 2017-06-30 (×5): 10 mg via ORAL
  Filled 2017-06-26 (×5): qty 1

## 2017-06-26 MED ORDER — ROPINIROLE HCL 1 MG PO TABS
1.0000 mg | ORAL_TABLET | Freq: Every evening | ORAL | Status: DC | PRN
  Administered 2017-06-26: 1 mg via ORAL
  Filled 2017-06-26 (×2): qty 1

## 2017-06-26 MED ORDER — ATORVASTATIN CALCIUM 10 MG PO TABS
10.0000 mg | ORAL_TABLET | Freq: Every day | ORAL | Status: DC
  Administered 2017-06-27 – 2017-06-30 (×4): 10 mg via ORAL
  Filled 2017-06-26 (×5): qty 1

## 2017-06-26 MED ORDER — METHOCARBAMOL 500 MG PO TABS
500.0000 mg | ORAL_TABLET | Freq: Four times a day (QID) | ORAL | Status: DC | PRN
  Administered 2017-06-26: 500 mg via ORAL
  Filled 2017-06-26: qty 1

## 2017-06-26 MED ORDER — MORPHINE SULFATE (PF) 4 MG/ML IV SOLN
6.0000 mg | Freq: Once | INTRAVENOUS | Status: AC
  Administered 2017-06-26: 6 mg via INTRAVENOUS
  Filled 2017-06-26: qty 2

## 2017-06-26 MED ORDER — HYDROCODONE-ACETAMINOPHEN 5-325 MG PO TABS
1.0000 | ORAL_TABLET | Freq: Once | ORAL | Status: AC
  Administered 2017-06-26: 1 via ORAL
  Filled 2017-06-26: qty 1

## 2017-06-26 MED ORDER — PANTOPRAZOLE SODIUM 40 MG PO TBEC
40.0000 mg | DELAYED_RELEASE_TABLET | Freq: Every day | ORAL | Status: DC
  Administered 2017-06-26 – 2017-06-30 (×5): 40 mg via ORAL
  Filled 2017-06-26 (×5): qty 1

## 2017-06-26 MED ORDER — SODIUM CHLORIDE 0.9 % IV SOLN
INTRAVENOUS | Status: DC
  Administered 2017-06-26: 23:00:00 via INTRAVENOUS

## 2017-06-26 MED ORDER — HYDROMORPHONE HCL 1 MG/ML IJ SOLN
0.5000 mg | Freq: Once | INTRAMUSCULAR | Status: AC
  Administered 2017-06-26: 0.5 mg via INTRAVENOUS
  Filled 2017-06-26: qty 1

## 2017-06-26 MED ORDER — QUINAPRIL HCL 10 MG PO TABS: 40.0000 mg | ORAL_TABLET | Freq: Every day | ORAL | Status: DC

## 2017-06-26 MED ORDER — MORPHINE SULFATE (PF) 4 MG/ML IV SOLN
4.0000 mg | Freq: Once | INTRAVENOUS | Status: AC
Start: 2017-06-26 — End: 2017-06-26
  Administered 2017-06-26: 4 mg via INTRAVENOUS
  Filled 2017-06-26: qty 1

## 2017-06-26 MED ORDER — BUPROPION HCL ER (SR) 150 MG PO TB12
150.0000 mg | ORAL_TABLET | Freq: Every day | ORAL | Status: DC
  Administered 2017-06-27 – 2017-06-30 (×4): 150 mg via ORAL
  Filled 2017-06-26 (×4): qty 1

## 2017-06-26 NOTE — ED Notes (Signed)
Ortho tech at bedside 
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
## 2017-06-26 NOTE — ED Triage Notes (Signed)
Patient experienced a fall last night injuring right hip. Patient was attempting to sit in a chair and missed the chair. No relief with prescribed pain medication. Pain 5/10. No assistive devices used at home.

## 2017-06-26 NOTE — ED Notes (Signed)
Ortho Tech called for Knee immobilizer.

## 2017-06-27 ENCOUNTER — Inpatient Hospital Stay (HOSPITAL_COMMUNITY): Payer: Medicare Other | Admitting: Anesthesiology

## 2017-06-27 ENCOUNTER — Inpatient Hospital Stay (HOSPITAL_COMMUNITY): Payer: Medicare Other

## 2017-06-27 ENCOUNTER — Encounter (HOSPITAL_COMMUNITY)
Admission: EM | Disposition: A | Discharge status: Home Health | Payer: Self-pay | Source: Home / Self Care | Attending: Nephrology

## 2017-06-27 ENCOUNTER — Other Ambulatory Visit: Payer: Self-pay

## 2017-06-27 ENCOUNTER — Encounter (HOSPITAL_COMMUNITY): Payer: Self-pay | Admitting: Physician Assistant

## 2017-06-27 DIAGNOSIS — Y92009 Unspecified place in unspecified non-institutional (private) residence as the place of occurrence of the external cause: Secondary | ICD-10-CM | POA: Diagnosis not present

## 2017-06-27 DIAGNOSIS — Z8673 Personal history of transient ischemic attack (TIA), and cerebral infarction without residual deficits: Secondary | ICD-10-CM

## 2017-06-27 DIAGNOSIS — I129 Hypertensive chronic kidney disease with stage 1 through stage 4 chronic kidney disease, or unspecified chronic kidney disease: Secondary | ICD-10-CM | POA: Diagnosis not present

## 2017-06-27 DIAGNOSIS — M19011 Primary osteoarthritis, right shoulder: Secondary | ICD-10-CM | POA: Diagnosis present

## 2017-06-27 DIAGNOSIS — N183 Chronic kidney disease, stage 3 (moderate): Secondary | ICD-10-CM | POA: Diagnosis not present

## 2017-06-27 DIAGNOSIS — Z90710 Acquired absence of both cervix and uterus: Secondary | ICD-10-CM | POA: Diagnosis not present

## 2017-06-27 DIAGNOSIS — I10 Essential (primary) hypertension: Secondary | ICD-10-CM | POA: Diagnosis not present

## 2017-06-27 DIAGNOSIS — S72141A Displaced intertrochanteric fracture of right femur, initial encounter for closed fracture: Secondary | ICD-10-CM | POA: Diagnosis not present

## 2017-06-27 DIAGNOSIS — I4819 Other persistent atrial fibrillation: Secondary | ICD-10-CM | POA: Diagnosis present

## 2017-06-27 DIAGNOSIS — Z7901 Long term (current) use of anticoagulants: Secondary | ICD-10-CM | POA: Diagnosis not present

## 2017-06-27 DIAGNOSIS — R9431 Abnormal electrocardiogram [ECG] [EKG]: Secondary | ICD-10-CM | POA: Diagnosis not present

## 2017-06-27 DIAGNOSIS — M25561 Pain in right knee: Secondary | ICD-10-CM | POA: Diagnosis present

## 2017-06-27 DIAGNOSIS — K5900 Constipation, unspecified: Secondary | ICD-10-CM | POA: Diagnosis present

## 2017-06-27 DIAGNOSIS — S72009A Fracture of unspecified part of neck of unspecified femur, initial encounter for closed fracture: Secondary | ICD-10-CM | POA: Diagnosis present

## 2017-06-27 DIAGNOSIS — S72001A Fracture of unspecified part of neck of right femur, initial encounter for closed fracture: Secondary | ICD-10-CM | POA: Diagnosis not present

## 2017-06-27 DIAGNOSIS — Z79899 Other long term (current) drug therapy: Secondary | ICD-10-CM | POA: Diagnosis not present

## 2017-06-27 DIAGNOSIS — I4891 Unspecified atrial fibrillation: Secondary | ICD-10-CM | POA: Diagnosis not present

## 2017-06-27 DIAGNOSIS — G2581 Restless legs syndrome: Secondary | ICD-10-CM | POA: Diagnosis not present

## 2017-06-27 DIAGNOSIS — E785 Hyperlipidemia, unspecified: Secondary | ICD-10-CM | POA: Diagnosis not present

## 2017-06-27 DIAGNOSIS — S72011A Unspecified intracapsular fracture of right femur, initial encounter for closed fracture: Secondary | ICD-10-CM | POA: Diagnosis not present

## 2017-06-27 DIAGNOSIS — G894 Chronic pain syndrome: Secondary | ICD-10-CM | POA: Diagnosis not present

## 2017-06-27 DIAGNOSIS — I481 Persistent atrial fibrillation: Secondary | ICD-10-CM | POA: Diagnosis not present

## 2017-06-27 DIAGNOSIS — I499 Cardiac arrhythmia, unspecified: Secondary | ICD-10-CM

## 2017-06-27 DIAGNOSIS — J449 Chronic obstructive pulmonary disease, unspecified: Secondary | ICD-10-CM | POA: Diagnosis not present

## 2017-06-27 DIAGNOSIS — W07XXXA Fall from chair, initial encounter: Secondary | ICD-10-CM | POA: Diagnosis not present

## 2017-06-27 DIAGNOSIS — K219 Gastro-esophageal reflux disease without esophagitis: Secondary | ICD-10-CM | POA: Diagnosis present

## 2017-06-27 DIAGNOSIS — M1611 Unilateral primary osteoarthritis, right hip: Secondary | ICD-10-CM | POA: Diagnosis not present

## 2017-06-27 DIAGNOSIS — I480 Paroxysmal atrial fibrillation: Secondary | ICD-10-CM | POA: Diagnosis not present

## 2017-06-27 DIAGNOSIS — Z905 Acquired absence of kidney: Secondary | ICD-10-CM | POA: Diagnosis not present

## 2017-06-27 DIAGNOSIS — Z8249 Family history of ischemic heart disease and other diseases of the circulatory system: Secondary | ICD-10-CM | POA: Diagnosis not present

## 2017-06-27 DIAGNOSIS — F329 Major depressive disorder, single episode, unspecified: Secondary | ICD-10-CM | POA: Diagnosis not present

## 2017-06-27 DIAGNOSIS — E039 Hypothyroidism, unspecified: Secondary | ICD-10-CM | POA: Diagnosis not present

## 2017-06-27 DIAGNOSIS — I4510 Unspecified right bundle-branch block: Secondary | ICD-10-CM | POA: Diagnosis not present

## 2017-06-27 DIAGNOSIS — S72041A Displaced fracture of base of neck of right femur, initial encounter for closed fracture: Secondary | ICD-10-CM | POA: Diagnosis not present

## 2017-06-27 DIAGNOSIS — Z01810 Encounter for preprocedural cardiovascular examination: Secondary | ICD-10-CM | POA: Diagnosis not present

## 2017-06-27 DIAGNOSIS — Z87891 Personal history of nicotine dependence: Secondary | ICD-10-CM | POA: Diagnosis not present

## 2017-06-27 DIAGNOSIS — E7800 Pure hypercholesterolemia, unspecified: Secondary | ICD-10-CM | POA: Diagnosis not present

## 2017-06-27 HISTORY — PX: TOTAL HIP ARTHROPLASTY: SHX124

## 2017-06-27 HISTORY — DX: Other persistent atrial fibrillation: I48.19

## 2017-06-27 LAB — SURGICAL PCR SCREEN
MRSA, PCR: NEGATIVE
Staphylococcus aureus: POSITIVE — AB

## 2017-06-27 LAB — GLUCOSE, CAPILLARY: Glucose-Capillary: 140 mg/dL — ABNORMAL HIGH (ref 65–99)

## 2017-06-27 LAB — CBC
HCT: 37.7 % (ref 36.0–46.0)
Hemoglobin: 12.3 g/dL (ref 12.0–15.0)
MCH: 29.9 pg (ref 26.0–34.0)
MCHC: 32.6 g/dL (ref 30.0–36.0)
MCV: 91.5 fL (ref 78.0–100.0)
Platelets: 256 10*3/uL (ref 150–400)
RBC: 4.12 MIL/uL (ref 3.87–5.11)
RDW: 13 % (ref 11.5–15.5)
WBC: 5.4 10*3/uL (ref 4.0–10.5)

## 2017-06-27 LAB — BASIC METABOLIC PANEL
Anion gap: 10 (ref 5–15)
BUN: 13 mg/dL (ref 6–20)
CO2: 26 mmol/L (ref 22–32)
Calcium: 8.9 mg/dL (ref 8.9–10.3)
Chloride: 102 mmol/L (ref 101–111)
Creatinine, Ser: 0.85 mg/dL (ref 0.44–1.00)
GFR calc Af Amer: >60 mL/min (ref >60)
GFR calc non Af Amer: >60 mL/min (ref >60)
Glucose, Bld: 105 mg/dL — ABNORMAL HIGH (ref 65–99)
Potassium: 3.5 mmol/L (ref 3.5–5.1)
Sodium: 138 mmol/L (ref 135–145)

## 2017-06-27 SURGERY — ARTHROPLASTY, HIP, TOTAL, ANTERIOR APPROACH
Anesthesia: General | Site: Hip | Laterality: Right

## 2017-06-27 MED ORDER — OXYCODONE HCL 5 MG PO TABS: 5.0000 mg | ORAL_TABLET | Freq: Once | ORAL | Status: DC | PRN

## 2017-06-27 MED ORDER — SUGAMMADEX SODIUM 500 MG/5ML IV SOLN
INTRAVENOUS | Status: DC | PRN
  Administered 2017-06-27: 150 mg via INTRAVENOUS

## 2017-06-27 MED ORDER — ROCURONIUM BROMIDE 50 MG/5ML IV SOSY
PREFILLED_SYRINGE | INTRAVENOUS | Status: AC
  Filled 2017-06-27: qty 5

## 2017-06-27 MED ORDER — DEXAMETHASONE SODIUM PHOSPHATE 10 MG/ML IJ SOLN
INTRAMUSCULAR | Status: DC | PRN
  Administered 2017-06-27: 5 mg via INTRAVENOUS

## 2017-06-27 MED ORDER — SODIUM CHLORIDE 0.9 % IV SOLN
INTRAVENOUS | Status: DC
  Administered 2017-06-28: 1 mL via INTRAVENOUS
  Administered 2017-06-28 – 2017-06-30 (×4): via INTRAVENOUS

## 2017-06-27 MED ORDER — OXYCODONE HCL 5 MG/5ML PO SOLN
5.0000 mg | Freq: Once | ORAL | Status: DC | PRN
  Filled 2017-06-27: qty 5

## 2017-06-27 MED ORDER — SODIUM CHLORIDE 0.9 % IV SOLN
INTRAVENOUS | Status: DC
  Administered 2017-06-27 (×3): via INTRAVENOUS

## 2017-06-27 MED ORDER — HYDROMORPHONE HCL 1 MG/ML IJ SOLN
INTRAMUSCULAR | Status: AC
  Filled 2017-06-27: qty 1

## 2017-06-27 MED ORDER — CHLORHEXIDINE GLUCONATE 4 % EX LIQD
60.0000 mL | Freq: Once | CUTANEOUS | Status: AC
  Administered 2017-06-27: 4 via TOPICAL

## 2017-06-27 MED ORDER — HYDROMORPHONE HCL 1 MG/ML IJ SOLN
0.2500 mg | INTRAMUSCULAR | Status: DC | PRN
  Administered 2017-06-27 (×2): 0.5 mg via INTRAVENOUS

## 2017-06-27 MED ORDER — HYDROMORPHONE HCL 1 MG/ML IJ SOLN
0.2500 mg | INTRAMUSCULAR | Status: DC | PRN
  Administered 2017-06-27 (×4): 0.5 mg via INTRAVENOUS

## 2017-06-27 MED ORDER — DEXTROSE 5 % IV SOLN
500.0000 mg | Freq: Four times a day (QID) | INTRAVENOUS | Status: DC | PRN
  Administered 2017-06-27: 500 mg via INTRAVENOUS
  Filled 2017-06-27: qty 550

## 2017-06-27 MED ORDER — PHENOL 1.4 % MT LIQD
1.0000 | OROMUCOSAL | Status: DC | PRN
  Filled 2017-06-27: qty 177

## 2017-06-27 MED ORDER — EPHEDRINE 5 MG/ML INJ
INTRAVENOUS | Status: AC
  Filled 2017-06-27: qty 10

## 2017-06-27 MED ORDER — DILTIAZEM HCL 100 MG IV SOLR
2.5000 mg/h | INTRAVENOUS | Status: DC
  Administered 2017-06-28: 5 mg/h via INTRAVENOUS
  Filled 2017-06-27: qty 100

## 2017-06-27 MED ORDER — ACETAMINOPHEN 10 MG/ML IV SOLN
1000.0000 mg | Freq: Once | INTRAVENOUS | Status: AC
  Administered 2017-06-27: 1000 mg via INTRAVENOUS

## 2017-06-27 MED ORDER — ACETAMINOPHEN 650 MG RE SUPP: 650.0000 mg | Freq: Four times a day (QID) | RECTAL | Status: DC | PRN

## 2017-06-27 MED ORDER — HYDROMORPHONE HCL 1 MG/ML IJ SOLN
0.5000 mg | INTRAMUSCULAR | Status: DC | PRN
  Administered 2017-06-28 – 2017-06-30 (×7): 1 mg via INTRAVENOUS
  Filled 2017-06-27 (×7): qty 1

## 2017-06-27 MED ORDER — HYDROCODONE-ACETAMINOPHEN 5-325 MG PO TABS
1.0000 | ORAL_TABLET | Freq: Four times a day (QID) | ORAL | Status: DC | PRN
  Administered 2017-06-27 – 2017-06-30 (×9): 2 via ORAL
  Filled 2017-06-27 (×9): qty 2

## 2017-06-27 MED ORDER — CHLORHEXIDINE GLUCONATE CLOTH 2 % EX PADS: 6.0000 | MEDICATED_PAD | Freq: Every day | CUTANEOUS | Status: DC

## 2017-06-27 MED ORDER — FENTANYL CITRATE (PF) 250 MCG/5ML IJ SOLN
INTRAMUSCULAR | Status: DC | PRN
  Administered 2017-06-27 (×5): 50 ug via INTRAVENOUS

## 2017-06-27 MED ORDER — SUGAMMADEX SODIUM 200 MG/2ML IV SOLN
INTRAVENOUS | Status: AC
  Filled 2017-06-27: qty 2

## 2017-06-27 MED ORDER — LIDOCAINE 2% (20 MG/ML) 5 ML SYRINGE
INTRAMUSCULAR | Status: DC | PRN
  Administered 2017-06-27: 60 mg via INTRAVENOUS

## 2017-06-27 MED ORDER — HYDROMORPHONE HCL 1 MG/ML IJ SOLN
INTRAMUSCULAR | Status: AC
  Administered 2017-06-27: 0.5 mg via INTRAVENOUS
  Filled 2017-06-27: qty 2

## 2017-06-27 MED ORDER — POLYETHYLENE GLYCOL 3350 17 G PO PACK: 17.0000 g | PACK | Freq: Every day | ORAL | Status: DC | PRN

## 2017-06-27 MED ORDER — DEXAMETHASONE SODIUM PHOSPHATE 10 MG/ML IJ SOLN
8.0000 mg | Freq: Once | INTRAMUSCULAR | Status: AC
  Administered 2017-06-27: 8 mg via INTRAVENOUS
  Filled 2017-06-27: qty 1

## 2017-06-27 MED ORDER — VANCOMYCIN HCL IN DEXTROSE 1-5 GM/200ML-% IV SOLN
1000.0000 mg | Freq: Two times a day (BID) | INTRAVENOUS | Status: AC
  Administered 2017-06-28: 1000 mg via INTRAVENOUS
  Filled 2017-06-27: qty 200

## 2017-06-27 MED ORDER — METHOCARBAMOL 500 MG PO TABS: 500.0000 mg | ORAL_TABLET | Freq: Four times a day (QID) | ORAL | Status: DC | PRN

## 2017-06-27 MED ORDER — ROCURONIUM BROMIDE 10 MG/ML (PF) SYRINGE
PREFILLED_SYRINGE | INTRAVENOUS | Status: DC | PRN
  Administered 2017-06-27: 40 mg via INTRAVENOUS

## 2017-06-27 MED ORDER — ONDANSETRON HCL 4 MG PO TABS: 4.0000 mg | ORAL_TABLET | Freq: Four times a day (QID) | ORAL | Status: DC | PRN

## 2017-06-27 MED ORDER — MENTHOL 3 MG MT LOZG
1.0000 | LOZENGE | OROMUCOSAL | Status: DC | PRN
  Filled 2017-06-27: qty 9

## 2017-06-27 MED ORDER — POVIDONE-IODINE 10 % EX SWAB
2.0000 "application " | Freq: Once | CUTANEOUS | Status: AC
  Administered 2017-06-27: 2 via TOPICAL

## 2017-06-27 MED ORDER — ONDANSETRON HCL 4 MG/2ML IJ SOLN
4.0000 mg | Freq: Four times a day (QID) | INTRAMUSCULAR | Status: DC | PRN
Start: 2017-06-27 — End: 2017-06-30
  Administered 2017-06-27 – 2017-06-28 (×2): 4 mg via INTRAVENOUS
  Filled 2017-06-27 (×2): qty 2

## 2017-06-27 MED ORDER — METOCLOPRAMIDE HCL 5 MG PO TABS: 5.0000 mg | ORAL_TABLET | Freq: Three times a day (TID) | ORAL | Status: DC | PRN

## 2017-06-27 MED ORDER — PROPOFOL 10 MG/ML IV BOLUS
INTRAVENOUS | Status: AC
  Filled 2017-06-27: qty 20

## 2017-06-27 MED ORDER — DOCUSATE SODIUM 100 MG PO CAPS
100.0000 mg | ORAL_CAPSULE | Freq: Two times a day (BID) | ORAL | Status: DC
  Administered 2017-06-28 – 2017-06-30 (×5): 100 mg via ORAL
  Filled 2017-06-27 (×5): qty 1

## 2017-06-27 MED ORDER — ZOLPIDEM TARTRATE 5 MG PO TABS
5.0000 mg | ORAL_TABLET | Freq: Every evening | ORAL | Status: DC | PRN
Start: 2017-06-27 — End: 2017-06-30
  Administered 2017-06-27 – 2017-06-29 (×4): 5 mg via ORAL
  Filled 2017-06-27 (×4): qty 1

## 2017-06-27 MED ORDER — STERILE WATER FOR IRRIGATION IR SOLN
Status: DC | PRN
  Administered 2017-06-27: 2000 mL

## 2017-06-27 MED ORDER — PROMETHAZINE HCL 25 MG/ML IJ SOLN: 6.2500 mg | INTRAMUSCULAR | Status: DC | PRN

## 2017-06-27 MED ORDER — ACETAMINOPHEN 325 MG PO TABS: 650.0000 mg | ORAL_TABLET | Freq: Four times a day (QID) | ORAL | Status: DC | PRN

## 2017-06-27 MED ORDER — ACETAMINOPHEN 10 MG/ML IV SOLN
INTRAVENOUS | Status: AC
  Filled 2017-06-27: qty 100

## 2017-06-27 MED ORDER — FERROUS SULFATE 325 (65 FE) MG PO TABS
325.0000 mg | ORAL_TABLET | Freq: Two times a day (BID) | ORAL | Status: DC
  Administered 2017-06-28 – 2017-06-30 (×5): 325 mg via ORAL
  Filled 2017-06-27 (×5): qty 1

## 2017-06-27 MED ORDER — ALUM & MAG HYDROXIDE-SIMETH 200-200-20 MG/5ML PO SUSP
30.0000 mL | ORAL | Status: DC | PRN
  Administered 2017-06-28: 30 mL via ORAL
  Filled 2017-06-27: qty 30

## 2017-06-27 MED ORDER — LIDOCAINE 2% (20 MG/ML) 5 ML SYRINGE
INTRAMUSCULAR | Status: AC
  Filled 2017-06-27: qty 5

## 2017-06-27 MED ORDER — PROPOFOL 10 MG/ML IV BOLUS
INTRAVENOUS | Status: DC | PRN
  Administered 2017-06-27: 140 mg via INTRAVENOUS

## 2017-06-27 MED ORDER — MUPIROCIN 2 % EX OINT
1.0000 "application " | TOPICAL_OINTMENT | Freq: Two times a day (BID) | CUTANEOUS | Status: DC
  Administered 2017-06-27: 1 via NASAL
  Filled 2017-06-27: qty 22

## 2017-06-27 MED ORDER — ONDANSETRON HCL 4 MG/2ML IJ SOLN
INTRAMUSCULAR | Status: DC | PRN
  Administered 2017-06-27: 4 mg via INTRAVENOUS

## 2017-06-27 MED ORDER — METOCLOPRAMIDE HCL 5 MG/ML IJ SOLN: 5.0000 mg | Freq: Three times a day (TID) | INTRAMUSCULAR | Status: DC | PRN

## 2017-06-27 MED ORDER — KETAMINE HCL-SODIUM CHLORIDE 100-0.9 MG/10ML-% IV SOSY
PREFILLED_SYRINGE | INTRAVENOUS | Status: AC
  Filled 2017-06-27: qty 10

## 2017-06-27 MED ORDER — ONDANSETRON HCL 4 MG/2ML IJ SOLN
INTRAMUSCULAR | Status: AC
  Filled 2017-06-27: qty 2

## 2017-06-27 MED ORDER — SODIUM CHLORIDE 0.9 % IR SOLN
Status: DC | PRN
  Administered 2017-06-27: 1000 mL

## 2017-06-27 MED ORDER — VANCOMYCIN HCL IN DEXTROSE 1-5 GM/200ML-% IV SOLN
1000.0000 mg | INTRAVENOUS | Status: AC
  Administered 2017-06-27: 1000 mg via INTRAVENOUS
  Filled 2017-06-27: qty 200

## 2017-06-27 MED ORDER — ASPIRIN EC 81 MG PO TBEC
81.0000 mg | DELAYED_RELEASE_TABLET | Freq: Two times a day (BID) | ORAL | Status: DC
  Administered 2017-06-28 (×2): 81 mg via ORAL
  Filled 2017-06-27 (×2): qty 1

## 2017-06-27 MED ORDER — PHENYLEPHRINE 40 MCG/ML (10ML) SYRINGE FOR IV PUSH (FOR BLOOD PRESSURE SUPPORT)
PREFILLED_SYRINGE | INTRAVENOUS | Status: AC
  Filled 2017-06-27: qty 20

## 2017-06-27 MED ORDER — DEXAMETHASONE SODIUM PHOSPHATE 10 MG/ML IJ SOLN
INTRAMUSCULAR | Status: AC
  Filled 2017-06-27: qty 1

## 2017-06-27 MED ORDER — KETAMINE HCL 10 MG/ML IJ SOLN
INTRAMUSCULAR | Status: DC | PRN
  Administered 2017-06-27 (×3): 20 mg via INTRAVENOUS

## 2017-06-27 MED ORDER — DILTIAZEM LOAD VIA INFUSION
5.0000 mg | Freq: Once | INTRAVENOUS | Status: AC
  Administered 2017-06-28: 5 mg via INTRAVENOUS
  Filled 2017-06-27: qty 5

## 2017-06-27 MED ORDER — PHENYLEPHRINE 40 MCG/ML (10ML) SYRINGE FOR IV PUSH (FOR BLOOD PRESSURE SUPPORT)
PREFILLED_SYRINGE | INTRAVENOUS | Status: DC | PRN
  Administered 2017-06-27: 80 ug via INTRAVENOUS
  Administered 2017-06-27 (×3): 120 ug via INTRAVENOUS

## 2017-06-27 MED ORDER — FENTANYL CITRATE (PF) 250 MCG/5ML IJ SOLN
INTRAMUSCULAR | Status: AC
  Filled 2017-06-27: qty 5

## 2017-06-27 SURGICAL SUPPLY — 38 items
ADH SKN CLS APL DERMABOND .7 (GAUZE/BANDAGES/DRESSINGS) ×1
BAG DECANTER FOR FLEXI CONT (MISCELLANEOUS) IMPLANT
BAG SPEC THK2 15X12 ZIP CLS (MISCELLANEOUS)
BAG ZIPLOCK 12X15 (MISCELLANEOUS) IMPLANT
BLADE SAG 18X100X1.27 (BLADE) ×2 IMPLANT
CAPT HIP TOTAL 2 ×2 IMPLANT
CLOTH BEACON ORANGE TIMEOUT ST (SAFETY) ×2 IMPLANT
COVER PERINEAL POST (MISCELLANEOUS) ×2 IMPLANT
COVER SURGICAL LIGHT HANDLE (MISCELLANEOUS) ×2 IMPLANT
DERMABOND ADVANCED (GAUZE/BANDAGES/DRESSINGS) ×1
DERMABOND ADVANCED .7 DNX12 (GAUZE/BANDAGES/DRESSINGS) ×1 IMPLANT
DRAPE STERI IOBAN 125X83 (DRAPES) ×2 IMPLANT
DRAPE U-SHAPE 47X51 STRL (DRAPES) ×4 IMPLANT
DRESSING AQUACEL AG SP 3.5X10 (GAUZE/BANDAGES/DRESSINGS) ×1 IMPLANT
DRSG AQUACEL AG ADV 3.5X10 (GAUZE/BANDAGES/DRESSINGS) ×1 IMPLANT
DRSG AQUACEL AG SP 3.5X10 (GAUZE/BANDAGES/DRESSINGS) ×2
DURAPREP 26ML APPLICATOR (WOUND CARE) ×2 IMPLANT
ELECT REM PT RETURN 15FT ADLT (MISCELLANEOUS) ×2 IMPLANT
GLOVE BIOGEL M STRL SZ7.5 (GLOVE) ×4 IMPLANT
GLOVE BIOGEL PI IND STRL 7.5 (GLOVE) ×1 IMPLANT
GLOVE BIOGEL PI IND STRL 8.5 (GLOVE) ×1 IMPLANT
GLOVE BIOGEL PI INDICATOR 7.5 (GLOVE) ×1
GLOVE BIOGEL PI INDICATOR 8.5 (GLOVE) ×1
GLOVE ECLIPSE 8.0 STRL XLNG CF (GLOVE) ×4 IMPLANT
GLOVE ORTHO TXT STRL SZ7.5 (GLOVE) ×2 IMPLANT
GOWN STRL REUS W/TWL LRG LVL3 (GOWN DISPOSABLE) ×2 IMPLANT
GOWN STRL REUS W/TWL XL LVL3 (GOWN DISPOSABLE) ×2 IMPLANT
HOLDER FOLEY CATH W/STRAP (MISCELLANEOUS) ×2 IMPLANT
PACK ANTERIOR HIP CUSTOM (KITS) ×2 IMPLANT
SUT MNCRL AB 4-0 PS2 18 (SUTURE) ×2 IMPLANT
SUT STRATAFIX 0 PDS 27 VIOLET (SUTURE) ×2
SUT VIC AB 1 CT1 36 (SUTURE) ×6 IMPLANT
SUT VIC AB 2-0 CT1 27 (SUTURE) ×4
SUT VIC AB 2-0 CT1 TAPERPNT 27 (SUTURE) ×2 IMPLANT
SUTURE STRATFX 0 PDS 27 VIOLET (SUTURE) ×1 IMPLANT
TRAY FOLEY W/METER SILVER 16FR (SET/KITS/TRAYS/PACK) IMPLANT
WATER STERILE IRR 1000ML POUR (IV SOLUTION) ×2 IMPLANT
YANKAUER SUCT BULB TIP 10FT TU (MISCELLANEOUS) IMPLANT

## 2017-06-27 NOTE — Progress Notes (Signed)
Initial Nutrition Assessment  DOCUMENTATION CODES:   Non-severe (moderate) malnutrition in context of chronic illness  INTERVENTION:   -Diet advancement per MD -Once diet advanced, provide [employer] Breakfast with soy milk, each provides 240 kcal and 13g protein. -Provide Multivitamin with minerals daily -Reviewed alternative protein options with patient  -RD will continue to monitor  NUTRITION DIAGNOSIS:   Moderate Malnutrition related to acute illness, decreased appetite(abdominal pain) as evidenced by energy intake < or equal to 75% for > or equal to 1 month, moderate fat depletion, moderate muscle depletion.  GOAL:   Patient will meet greater than or equal to 90% of their needs  MONITOR:   Diet advancement, Labs, Weight trends, I & O's  REASON FOR ASSESSMENT:   Consult Hip fracture protocol  ASSESSMENT:   71 y.o. female, with history of restless leg syndrome, GERD, depression came to hospital after patient fell from a rocking chair hitting her right hip.  Patient says that she felt pain in the hip and was brought to the hospital.  She was found to have subcapital right hip fracture with slight impaction.  Patient in room with family at bedside. Pt NPO, awaiting hip surgery today. Pt reports having abdominal problems for years and she has identified that she cannot tolerate fresh, fibrous foods. She states she had her gallbladder removed last year and that has helped some. She lost a lot of weight during that time and has not gained the weight back. Pt typically consumes 1.5 meals a day. States she will eat a "normal" dinner and snack on nuts, crackers, vanilla wafers during the day. Pt states she has gone days without eating recently.  Pt does not like Ensure or Boost products and doesn't like milk products. She will drink milk alternatives. Provided examples of protein supplement options she can try at home. She is willing to try [employer] drink  once her diet is advanced post-op.   Per chart review, pt weighed 118 lb in November 2017. She has since gained weight.  Labs reviewed. Medications: Protonix tablet daily  NUTRITION - FOCUSED PHYSICAL EXAM:    Most Recent Value  Orbital Region  No depletion  Upper Arm Region  Moderate depletion  Thoracic and Lumbar Region  Unable to assess  Buccal Region  No depletion  Temple Region  Mild depletion  Clavicle Bone Region  Moderate depletion  Clavicle and Acromion Bone Region  Moderate depletion  Scapular Bone Region  Moderate depletion  Dorsal Hand  Moderate depletion  Patellar Region  Unable to assess  Anterior Thigh Region  Unable to assess  Posterior Calf Region  Unable to assess  Edema (RD Assessment)  None       Diet Order:  Diet clear liquid Room service appropriate? Yes; Fluid consistency: Thin  EDUCATION NEEDS:   Education needs have been addressed  Skin:  Skin Assessment: Reviewed RN Assessment  Last BM:  11/6  Height:   Ht Readings from Last 1 Encounters:  06/26/17 5\' 7"  (1.702 m)    Weight:   Wt Readings from Last 1 Encounters:  06/26/17 126 lb (57.2 kg)    Ideal Body Weight:  61.3 kg  BMI:  Body mass index is 19.73 kg/m.  Estimated Nutritional Needs:   Kcal:  1500-1700  Protein:  65-75g  Fluid:  1.7L/day  Clayton Bibles, MS, RD, LDN [facility] Pager: [phone] After Hours Pager: [phone]
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
## 2017-06-27 NOTE — Interval H&P Note (Signed)
History and Physical Interval Note:  06/27/2017 6:20 PM  [name]  has presented today for surgery, with the diagnosis of Right Hip Femoral Neck Fracture  The various methods of treatment have been discussed with the patient and family. After consideration of risks, benefits and other options for treatment, the patient has consented to  Procedure(s): TOTAL HIP ARTHROPLASTY ANTERIOR APPROACH (Right) as a surgical intervention .  The patient's history has been reviewed, patient examined, no change in status, stable for surgery.  I have reviewed the patient's chart and labs.  Questions were answered to the patient's satisfaction.     Mauri Pole

## 2017-06-27 NOTE — Plan of Care (Signed)
Progressing with pain management. Awaiting evaluation with Orthopedics.
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
## 2017-06-27 NOTE — Anesthesia Procedure Notes (Signed)
Date/Time: 06/27/2017 8:23 PM Performed by: Cynda Familia, CRNA Pre-anesthesia Checklist: Emergency Drugs available, Suction available and Patient being monitored Oxygen Delivery Method: Simple face mask Placement Confirmation: positive ETCO2 and breath sounds checked- equal and bilateral Dental Injury: Teeth and Oropharynx as per pre-operative assessment  Comments: Extubated to face mask

## 2017-06-27 NOTE — Anesthesia Procedure Notes (Signed)
Procedure Name: Intubation Date/Time: 06/27/2017 6:44 PM Performed by: Cynda Familia, CRNA Pre-anesthesia Checklist: Patient identified, Emergency Drugs available, Suction available and Patient being monitored Patient Re-evaluated:Patient Re-evaluated prior to induction Oxygen Delivery Method: Circle System Utilized Preoxygenation: Pre-oxygenation with 100% oxygen Induction Type: IV induction Ventilation: Mask ventilation without difficulty Laryngoscope Size: Miller and 2 Grade View: Grade I Tube type: Oral Number of attempts: 1 Airway Equipment and Method: Stylet Placement Confirmation: ETT inserted through vocal cords under direct vision,  positive ETCO2 and breath sounds checked- equal and bilateral Secured at: 22 cm Tube secured with: Tape Dental Injury: Teeth and Oropharynx as per pre-operative assessment  Comments: Smooth IV induction-- Rose MD--- intubation AM CRNA atraumatic-- teeth and mouth as preop--- broken teeth on bottom and many missing one--- no teeth on top--- bilat BS
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
## 2017-06-27 NOTE — Transfer of Care (Signed)
Immediate Anesthesia Transfer of Care Note  Patient: [name]  Procedure(s) Performed: TOTAL HIP ARTHROPLASTY ANTERIOR APPROACH (Right Hip)  Patient Location: PACU  Anesthesia Type:General  Level of Consciousness: awake and alert   Airway & Oxygen Therapy: Patient Spontanous Breathing and Patient connected to face mask oxygen  Post-op Assessment: Report given to RN and Post -op Vital signs reviewed and stable  Post vital signs: Reviewed and stable  Last Vitals:  Vitals:   06/27/17 1335 06/27/17 1720  BP: 114/70 (!) 145/97  Pulse: 88 91  Resp: 17 16  Temp: 36.7 C 37.6 C  SpO2: 96% 98%    Last Pain:  Vitals:   06/27/17 1747  TempSrc:   PainSc: 6       Patients Stated Pain Goal: 4 ([id number])  Complications: No apparent anesthesia complications

## 2017-06-27 NOTE — Anesthesia Postprocedure Evaluation (Signed)
Anesthesia Post Note  Patient: [name]  Procedure(s) Performed: TOTAL HIP ARTHROPLASTY ANTERIOR APPROACH (Right Hip)     Patient location during evaluation: PACU Anesthesia Type: General Level of consciousness: awake and alert Pain management: pain level controlled Vital Signs Assessment: post-procedure vital signs reviewed and stable Respiratory status: spontaneous breathing, nonlabored ventilation, respiratory function stable and patient connected to nasal cannula oxygen Cardiovascular status: blood pressure returned to baseline and stable Postop Assessment: no apparent nausea or vomiting Anesthetic complications: no    Last Vitals:  Vitals:   06/27/17 2144 06/27/17 2200  BP: (!) 146/86 (!) 143/98  Pulse: (!) 115 (!) 113  Resp: (!) 23 (!) 24  Temp: 37.3 C 36.8 C  SpO2: 100% 100%    Last Pain:  Vitals:   06/27/17 2322  TempSrc:   PainSc: 7                  [name] S

## 2017-06-27 NOTE — Progress Notes (Signed)
PROGRESS NOTE    [name]  WUJ:[id number] DOB: [birth year] DOA: 06/26/2017 PCP: Prince Solian, MD   Brief Narrative:  71 year-old female with PMH significant for chronic pain syndrome and osteoporosis presented to the ED last night with right hip pain following a mechanical fall from a chair onto the floor. Imaging shows subcapital right hip fracture with slight impaction and no associated pelvic fracture. Ortho consulted with plan for surgery today. Pre-op labs and CXR ordered in ED, all negative. No cp, SOB, syncope, n/v/d. Pt not on AC or anti-platelet agents. Pt admitted to our service pending surgery today.    Assessment & Plan:   Active Problems:   Hip fracture ([place])  Right subcapital hip fracture Admitted, plan for surgery today. Medically clear for procedure. NPO since midnight. Pt reports adequate pain relief with fentanyl patch, Norco, morphine, and Robaxin. Will continue. Will need PT evaluation post-surgery.   Chronic pain syndrome Pt on multiple pain medications at home. Continuing pain management as above.   Irregular heartbeat Pt with irregularly irregular rhythm. EKG in ED last night without discernable p-waves, but difficult to assess 2/2 poor baseline. ? Afib and incomplete RBBB. Previous EKGs have shown sinus rhythm, but with frequent PACs and no RBBB morphology. 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 At that time, event monitor with no arrhythmia. Will repeat EKG today, obtain echo, and consult cardiology.   Depression On Prozac and Wellbutrin, will continue.  Dyslipidemia On Lipitor, will continue.   Essential hypertension BP 115/71 this morning. On lisinopril, will continue.   Hypothyroidism On levothyroxine, will continue.   DVT prophylaxis: Lovenox Code Status: full code Family Communication: brother and sister-in-law at bedside, all concerns addressed Disposition Plan: SNF pending PT recs  Consultants:    Orthopedics   Procedures: Antimicrobials:  Subjective:  Pt feeling better, states that pain is adequately controlled as long as she "stays in front of it". No cp, sob, n/v/d.   Objective: Vitals:   06/26/17 2008 06/26/17 2042 06/27/17 0508 06/27/17 0952  BP: 123/82 121/72 (!) 128/92 115/71  Pulse: (!) 108 86 98 76  Resp: (!) 31 20 20    Temp:  100.1 F (37.8 C) 99.2 F (37.3 C)   TempSrc:  Oral Oral   SpO2: 94% 97% 97%   Weight:  57.2 kg (126 lb)    Height:  5\' 7"  (1.702 m)      Intake/Output Summary (Last 24 hours) at 06/27/2017 1058 Last data filed at 06/27/2017 0805 Gross per 24 hour  Intake 704.17 ml  Output -  Net 704.17 ml   Filed Weights   06/26/17 2042  Weight: 57.2 kg (126 lb)    Examination:  General exam: Appears calm and comfortable  Respiratory system: Clear to auscultation. Respiratory effort normal. No wheezing or crackle Cardiovascular system: S1 & S2 heard, Irregularly irregular. No m/g/r.  No pedal edema. Gastrointestinal system: Abdomen is nondistended, soft and nontender. Normal bowel sounds heard. Central nervous system: Alert and oriented. No focal neurological deficits. Extremities: RLE elevated on pillow, externally rotated Skin: No rashes, lesions or ulcers Psychiatry: Judgement and insight appear normal. Mood & affect appropriate.   Data Reviewed: I have personally reviewed following labs and imaging studies  CBC: Recent Labs  Lab 06/26/17 1624 06/27/17 0549  WBC 7.3 5.4  HGB 12.7 12.3  HCT 38.8 37.7  MCV 90.2 91.5  PLT 308 956   Basic Metabolic Panel: Recent Labs  Lab 06/26/17 1624  06/27/17 0549  NA 140 138  K 4.2 3.5  CL 100* 102  CO2 29 26  GLUCOSE 106* 105*  BUN 13 13  CREATININE 0.94 0.85  CALCIUM 9.5 8.9   GFR: Estimated Creatinine Clearance: 54 mL/min (by C-G formula based on SCr of 0.85 mg/dL). Liver Function Tests: No results for input(s): AST, ALT, ALKPHOS, BILITOT, PROT, ALBUMIN in the last 168  hours. No results for input(s): LIPASE, AMYLASE in the last 168 hours. No results for input(s): AMMONIA in the last 168 hours. Coagulation Profile: Recent Labs  Lab 06/26/17 1624  INR 1.05   Cardiac Enzymes: No results for input(s): CKTOTAL, CKMB, CKMBINDEX, TROPONINI in the last 168 hours. BNP (last 3 results) No results for input(s): PROBNP in the last 8760 hours. HbA1C: No results for input(s): HGBA1C in the last 72 hours. CBG: No results for input(s): GLUCAP in the last 168 hours. Lipid Profile: No results for input(s): CHOL, HDL, LDLCALC, TRIG, CHOLHDL, LDLDIRECT in the last 72 hours. Thyroid Function Tests: No results for input(s): TSH, T4TOTAL, FREET4, T3FREE, THYROIDAB in the last 72 hours. Anemia Panel: No results for input(s): VITAMINB12, FOLATE, FERRITIN, TIBC, IRON, RETICCTPCT in the last 72 hours. Sepsis Labs: No results for input(s): PROCALCITON, LATICACIDVEN in the last 168 hours.  Recent Results (from the past 240 hour(s))  Surgical pcr screen     Status: Abnormal   Collection Time: 06/26/17 11:41 PM  Result Value Ref Range Status   MRSA, PCR NEGATIVE NEGATIVE Final   Staphylococcus aureus POSITIVE (A) NEGATIVE Final    Comment: (NOTE) The Xpert SA Assay (FDA approved for NASAL specimens in patients 46 years of age and older), is one component of a comprehensive surveillance program. It is not intended to diagnose infection nor to guide or monitor treatment.          Radiology Studies: Dg Chest 1 View  Result Date: 06/26/2017 CLINICAL DATA:  Slipped and fell today.  Right hip pain. EXAM: CHEST 1 VIEW COMPARISON:  Report of a chest x-ray dated November 22, 2000 FINDINGS: The lungs are mildly hyperinflated. There is no focal infiltrate. The upper lobe interstitial markings are coarse. The heart is top-normal in size. The pulmonary vascularity is not engorged. There is no pleural effusion or pneumothorax. The observed bony thorax exhibits no acute abnormality.  IMPRESSION: Chronic bronchitic changes. No pneumonia, CHF, nor evidence of acute post traumatic injury. Electronically Signed   By: David  Martinique M.D.   On: 06/26/2017 15:52   Dg Hip Unilat W Or Wo Pelvis 2-3 Views Right  Result Date: 06/26/2017 CLINICAL DATA:  Slipped at 2 a.m. walking to the bathroom, fell, RIGHT hip pain. EXAM: DG HIP (WITH OR WITHOUT PELVIS) 2-3V RIGHT COMPARISON:  None. FINDINGS: There is a subcapital RIGHT hip fracture with slight impaction. BILATERAL hip joint space narrowing. No pelvic fracture. Vascular calcification. IMPRESSION: Subcapital RIGHT hip fracture with slight impaction. Electronically Signed   By: Staci Righter M.D.   On: 06/26/2017 15:51        Scheduled Meds: . atorvastatin  10 mg Oral Daily  . buPROPion  150 mg Oral Daily  . Chlorhexidine Gluconate Cloth  6 each Topical Daily  . fentaNYL  100 mcg Transdermal QODAY  . FLUoxetine  20 mg Oral QHS  . heparin  5,000 Units Subcutaneous Q8H  . levothyroxine  88 mcg Oral QAC breakfast  . lisinopril  40 mg Oral Daily  . loratadine  10 mg Oral Daily  . mupirocin ointment  1 application Nasal BID  . pantoprazole  40 mg Oral Daily   Continuous Infusions: . sodium chloride 50 mL/hr at 06/26/17 2307  . methocarbamol (ROBAXIN)  IV       LOS: 0 days    Merrilee Jansky, PA-S  06/27/2017, 10:58 AM
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
## 2017-06-28 ENCOUNTER — Encounter (HOSPITAL_COMMUNITY): Payer: Self-pay | Admitting: Orthopedic Surgery

## 2017-06-28 ENCOUNTER — Inpatient Hospital Stay (HOSPITAL_COMMUNITY): Payer: Medicare Other

## 2017-06-28 ENCOUNTER — Telehealth: Payer: Self-pay | Admitting: Cardiology

## 2017-06-28 DIAGNOSIS — I481 Persistent atrial fibrillation: Secondary | ICD-10-CM

## 2017-06-28 DIAGNOSIS — I480 Paroxysmal atrial fibrillation: Secondary | ICD-10-CM

## 2017-06-28 DIAGNOSIS — Z7901 Long term (current) use of anticoagulants: Secondary | ICD-10-CM

## 2017-06-28 DIAGNOSIS — R9431 Abnormal electrocardiogram [ECG] [EKG]: Secondary | ICD-10-CM

## 2017-06-28 LAB — BASIC METABOLIC PANEL
Anion gap: 11 (ref 5–15)
BUN: 12 mg/dL (ref 6–20)
CO2: 23 mmol/L (ref 22–32)
Calcium: 8.7 mg/dL — ABNORMAL LOW (ref 8.9–10.3)
Chloride: 103 mmol/L (ref 101–111)
Creatinine, Ser: 0.76 mg/dL (ref 0.44–1.00)
GFR calc Af Amer: >60 mL/min (ref >60)
GFR calc non Af Amer: >60 mL/min (ref >60)
Glucose, Bld: 145 mg/dL — ABNORMAL HIGH (ref 65–99)
Potassium: 3.5 mmol/L (ref 3.5–5.1)
Sodium: 137 mmol/L (ref 135–145)

## 2017-06-28 LAB — ECHOCARDIOGRAM COMPLETE
Height: 67 in
Weight: 2016 oz

## 2017-06-28 LAB — GLUCOSE, CAPILLARY
Glucose-Capillary: 124 mg/dL — ABNORMAL HIGH (ref 65–99)
Glucose-Capillary: 124 mg/dL — ABNORMAL HIGH (ref 65–99)
Glucose-Capillary: 151 mg/dL — ABNORMAL HIGH (ref 65–99)

## 2017-06-28 LAB — MRSA PCR SCREENING: MRSA by PCR: NEGATIVE

## 2017-06-28 LAB — CBC
HCT: 36 % (ref 36.0–46.0)
Hemoglobin: 11.8 g/dL — ABNORMAL LOW (ref 12.0–15.0)
MCH: 30 pg (ref 26.0–34.0)
MCHC: 32.8 g/dL (ref 30.0–36.0)
MCV: 91.6 fL (ref 78.0–100.0)
Platelets: 255 10*3/uL (ref 150–400)
RBC: 3.93 MIL/uL (ref 3.87–5.11)
RDW: 13.2 % (ref 11.5–15.5)
WBC: 10.2 10*3/uL (ref 4.0–10.5)

## 2017-06-28 MED ORDER — LISINOPRIL 20 MG PO TABS
20.0000 mg | ORAL_TABLET | Freq: Every day | ORAL | Status: DC
  Administered 2017-06-29 – 2017-06-30 (×2): 20 mg via ORAL
  Filled 2017-06-28: qty 1
  Filled 2017-06-28: qty 2

## 2017-06-28 MED ORDER — ADULT MULTIVITAMIN W/MINERALS CH
1.0000 | ORAL_TABLET | Freq: Every day | ORAL | Status: DC
  Administered 2017-06-28 – 2017-06-30 (×3): 1 via ORAL
  Filled 2017-06-28 (×3): qty 1

## 2017-06-28 MED ORDER — METOPROLOL TARTRATE 5 MG/5ML IV SOLN
2.5000 mg | INTRAVENOUS | Status: DC | PRN
Start: 2017-06-28 — End: 2017-06-30

## 2017-06-28 MED ORDER — BOOST / RESOURCE BREEZE PO LIQD
1.0000 | Freq: Three times a day (TID) | ORAL | Status: DC
  Administered 2017-06-28 – 2017-06-30 (×5): 1 via ORAL

## 2017-06-28 MED ORDER — DILTIAZEM HCL 30 MG PO TABS
30.0000 mg | ORAL_TABLET | Freq: Four times a day (QID) | ORAL | Status: DC
  Administered 2017-06-28 – 2017-06-29 (×4): 30 mg via ORAL
  Filled 2017-06-28 (×4): qty 1

## 2017-06-28 NOTE — Telephone Encounter (Signed)
11/8 Katie B. TCM/ [name] 07/11/2017 @10 :00am/ks

## 2017-06-28 NOTE — Progress Notes (Signed)
Patient ID: [name], female   DOB: 1946-08-05, 71 y.o.   MRN: [id number] Subjective: 1 Day Post-Op Procedure(s) (LRB): TOTAL HIP ARTHROPLASTY ANTERIOR APPROACH (Right)    Patient reports pain as moderate.  Pain is a little better this am.  No events.  HR has remained stable, no chest pains  Objective:   VITALS:   Vitals:   06/28/17 0400 06/28/17 0600  BP: 130/78 (!) 123/57  Pulse: 91 82  Resp: 14 15  Temp: 98.4 F (36.9 C)   SpO2: 95% 100%    Neurovascular intact Incision: dressing C/D/I  LABS Recent Labs    06/26/17 1624 06/27/17 0549 06/28/17 0337  HGB 12.7 12.3 11.8*  HCT 38.8 37.7 36.0  WBC 7.3 5.4 10.2  PLT 308 256 255    Recent Labs    06/26/17 1624 06/27/17 0549 06/28/17 0337  NA 140 138 137  K 4.2 3.5 3.5  BUN 13 13 12   CREATININE 0.94 0.85 0.76  GLUCOSE 106* 105* 145*    Recent Labs    06/26/17 1624  INR 1.05     Assessment/Plan: 1 Day Post-Op Procedure(s) (LRB): TOTAL HIP ARTHROPLASTY ANTERIOR APPROACH (Right)   Advance diet Up with therapy - WBAT RLE  Given stability of HR she should be able to be transferred to the Ortho floor Monitor progress with hopes of home discharge likely tomorrow or saturday

## 2017-06-28 NOTE — Progress Notes (Signed)
  Echocardiogram 2D Echocardiogram has been performed.  [name] F 06/28/2017, 5:04 PM

## 2017-06-28 NOTE — Evaluation (Signed)
Physical Therapy Evaluation Patient Details Name: [name] MRN: [id number] DOB: 03-14-1946 Today's Date: 06/28/2017   History of Present Illness  71 yo female slipped from chair 06/26/17 and sustained subcapital  hip fracture on Right. S/P direct anterior THA on 06/27/17.  Post op afib., to SDU.  Clinical Impression  The  Patient  Tolerated transfer to [facility] and recliner with assist of RW. Fatigued with activity. BP 107/48, HR max 120 during activity. Patient independent PTA. Pt admitted with above diagnosis. Pt currently with functional limitations due to the deficits listed below (see PT Problem List). Pt will benefit from skilled PT to increase their independence and safety with mobility to allow discharge to the venue listed below.       Follow Up Recommendations CIR    Equipment Recommendations  (TBD)    Recommendations for Other Services Rehab consult     Precautions / Restrictions Precautions Precautions: Fall Precaution Comments: monitor for Incr HR and BP-low Restrictions Weight Bearing Restrictions: No RLE Weight Bearing: Weight bearing as tolerated      Mobility  Bed Mobility Overal bed mobility: Needs Assistance Bed Mobility: Supine to Sit     Supine to sit: Min assist;HOB elevated;Mod assist     General bed mobility comments: assist with right leg and trunk, extra time.  Transfers Overall transfer level: Needs assistance Equipment used: Rolling walker (2 wheeled) Transfers: Sit to/from [facility] Sit to Stand: Mod assist;+2 safety/equipment Stand pivot transfers: Mod assist;+2 safety/equipment       General transfer comment: cues for hand  and right leg position,  cues for RW position, pivot steps to [facility] then to recliner.  Ambulation/Gait       Gait Pattern/deviations: Antalgic        Stairs            Wheelchair Mobility    Modified Rankin (Stroke Patients Only)       Balance Overall balance assessment:  History of Falls;Needs assistance Sitting-balance support: Feet supported;Bilateral upper extremity supported Sitting balance-Leahy Scale: Fair     Standing balance support: During functional activity;Bilateral upper extremity supported Standing balance-Leahy Scale: Poor                               Pertinent Vitals/Pain Pain Assessment: 0-10 Pain Score: 4  Pain Location: right hip sitting Pain Descriptors / Indicators: Discomfort Pain Intervention(s): Premedicated before session;Ice applied;Monitored during session    [place] expects to be discharged to:: Private residence Living Arrangements: Spouse/significant other Available Help at Discharge: Family Type of Home: House Home Access: Stairs to enter Entrance Stairs-Rails: None Entrance Stairs-Number of Steps: 2(no rail) Home Layout: Multi-level Home Equipment: Environmental consultant - 4 wheels;Walker - standard;Bedside commode Additional Comments: 7 steps down to living room rail on right  then 7 to bed and bath    Prior Function Level of Independence: Independent               Hand Dominance        Extremity/Trunk Assessment        Lower Extremity Assessment Lower Extremity Assessment: RLE deficits/detail RLE Deficits / Details: decreased weight tolerated, assist to move to edge of bed    Cervical / Trunk Assessment Cervical / Trunk Assessment: Kyphotic  Communication   Communication: No difficulties  Cognition Arousal/Alertness: Awake/alert Behavior During Therapy: WFL for tasks assessed/performed Overall Cognitive Status: Within Functional Limits for tasks assessed  General Comments      Exercises     Assessment/Plan    PT Assessment Patient needs continued PT services  PT Problem List Decreased strength;Decreased range of motion;Decreased activity tolerance;Decreased mobility;Decreased balance;Cardiopulmonary status  limiting activity;Decreased knowledge of precautions;Decreased safety awareness;Decreased knowledge of use of DME       PT Treatment Interventions DME instruction;Gait training;Stair training;Functional mobility training;Therapeutic activities;Therapeutic exercise;Patient/family education    PT Goals (Current goals can be found in the Care Plan section)  Acute Rehab PT Goals Patient Stated Goal: to go home PT Goal Formulation: With patient/family Time For Goal Achievement: 07/05/17 Potential to Achieve Goals: Good    Frequency Min 6X/week   Barriers to discharge        Co-evaluation PT/OT/SLP Co-Evaluation/Treatment: Yes Reason for Co-Treatment: For patient/therapist safety PT goals addressed during session: Mobility/safety with mobility OT goals addressed during session: ADL's and self-care       AM-PAC PT "6 Clicks" Daily Activity  Outcome Measure Difficulty turning over in bed (including adjusting bedclothes, sheets and blankets)?: Unable Difficulty moving from lying on back to sitting on the side of the bed? : Unable Difficulty sitting down on and standing up from a chair with arms (e.g., wheelchair, bedside commode, etc,.)?: Unable Help needed moving to and from a bed to chair (including a wheelchair)?: Total Help needed walking in hospital room?: Total Help needed climbing 3-5 steps with a railing? : Total 6 Click Score: 6    End of Session   Activity Tolerance: Patient limited by fatigue Patient left: in chair;with call bell/phone within reach;with chair alarm set;with family/visitor present Nurse Communication: Mobility status PT Visit Diagnosis: Difficulty in walking, not elsewhere classified (R26.2);Pain Pain - Right/Left: Right Pain - part of body: Hip    Time: 1023-1110 PT Time Calculation (min) (ACUTE ONLY): 47 min   Charges:   PT Evaluation $PT Eval Low Complexity: 1 Low PT Treatments $Therapeutic Activity: 8-22 mins   PT G CodesTresa Endo PT [phone]   Claretha Cooper 06/28/2017, 11:29 AM
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
## 2017-06-28 NOTE — Progress Notes (Signed)
Rehab Admissions Coordinator Note:  Patient was screened by Retta Diones for appropriateness for an Inpatient Acute Rehab Consult.  At this time, we are recommending [place] or [facility] therapies depending on progress.  Patient lacks the medical necessity to support an acute inpatient rehab admission given current diagnosis.  Call me for questions.  Retta Diones 06/28/2017, 11:37 AM  I can be reached at [phone].

## 2017-06-28 NOTE — Evaluation (Signed)
Occupational Therapy Evaluation Patient Details Name: [name] MRN: [id number] DOB: 01/17/1946 Today's Date: 06/28/2017    History of Present Illness 71 yo female slipped from chair 06/26/17 and sustained subcapital  hip fracture on Right. S/P direct anterior THA on 06/27/17.  Post op afib., to SDU.  H/O CVA with visual field deficit   Clinical Impression   Pt was admitted for the above. She tolerated 2 SPTs this session with HR up to 115.  Educated on working within pain tolerance. Husband will assist pt as needed at home. Goals are for min guard level in acute setting    Follow Up Recommendations  CIR    Equipment Recommendations  None recommended by OT    Recommendations for Other Services       Precautions / Restrictions Precautions Precautions: Fall Precaution Comments: monitor for Incr HR and BP-low Restrictions Weight Bearing Restrictions: No RLE Weight Bearing: Weight bearing as tolerated      Mobility Bed Mobility Overal bed mobility: Needs Assistance Bed Mobility: Supine to Sit     Supine to sit: Min assist     General bed mobility comments: HOB raised  Transfers Overall transfer level: Needs assistance Equipment used: Rolling walker (2 wheeled) Transfers: Sit to/from [facility] Sit to Stand: Mod assist;+2 safety/equipment Stand pivot transfers: Mod assist;+2 safety/equipment       General transfer comment: cues for hand  and right leg position,  cues for RW position, pivot steps to [facility] then to recliner.    Balance Overall balance assessment: History of Falls;Needs assistance Sitting-balance support: Feet supported;Bilateral upper extremity supported Sitting balance-Leahy Scale: Fair     Standing balance support: During functional activity;Bilateral upper extremity supported Standing balance-Leahy Scale: Poor                             ADL either performed or assessed with clinical judgement   ADL Overall  ADL's : Needs assistance/impaired Eating/Feeding: Independent   Grooming: Set up;Sitting   Upper Body Bathing: Set up;Sitting   Lower Body Bathing: Moderate assistance;Sit to/from stand;+2 for safety/equipment   Upper Body Dressing : Minimal assistance;Sitting(lines)   Lower Body Dressing: Maximal assistance;+2 for safety/equipment;Sit to/from stand   Toilet Transfer: Moderate assistance;+2 for safety/equipment;Stand-pivot;BSC;RW   Toileting- Clothing Manipulation and Hygiene: Total assistance;Sit to/from stand;+2 for safety/equipment         General ADL Comments: performed spt to 3;1 commode then to chair. HR up to 115  Reviewed working within pain tolerance for adls.  Husband will assist as needed.  Pt will sponge bathe until she can step over tub.  She is not interested in AE     Vision Patient Visual Report: (per chart VFD)  Not assessed on this visit     Perception     Praxis      Pertinent Vitals/Pain Pain Assessment: 0-10 Pain Score: 4  Pain Location: right hip sitting Pain Descriptors / Indicators: Discomfort Pain Intervention(s): Limited activity within patient's tolerance;Monitored during session;Premedicated before session;Repositioned;Ice applied     Hand Dominance     Extremity/Trunk Assessment Upper Extremity Assessment Upper Extremity Assessment: Generalized weakness      Cervical / Trunk Assessment Cervical / Trunk Assessment: Kyphotic   Communication Communication Communication: No difficulties   Cognition Arousal/Alertness: Awake/alert Behavior During Therapy: WFL for tasks assessed/performed Overall Cognitive Status: Within Functional Limits for tasks assessed  General Comments       Exercises     Shoulder Instructions      Home Living Family/patient expects to be discharged to:: Private residence Living Arrangements: Spouse/significant other Available Help at Discharge:  Family Type of Home: House Home Access: Stairs to enter [employer] of Steps: 2(no rail) Entrance Stairs-Rails: None Home Layout: Multi-level Alternate Level Stairs-Number of Steps: 7 down to den, 7 up to bed and bath Alternate Level Stairs-Rails: Left Bathroom Shower/Tub: Teacher, early years/pre: Handicapped height(downstairs)     Home Equipment: Environmental consultant - 4 wheels;Walker - standard;Bedside commode   Additional Comments: 7 steps down to living room rail on right  then 7 to bed and bath      Prior Functioning/Environment Level of Independence: Independent                 OT Problem List: Decreased strength;Decreased activity tolerance;Impaired balance (sitting and/or standing);Decreased knowledge of use of DME or AE;Cardiopulmonary status limiting activity;Pain;Impaired vision/perception      OT Treatment/Interventions: Self-care/ADL training;DME and/or AE instruction;Patient/family education;Balance training    OT Goals(Current goals can be found in the care plan section) Acute Rehab OT Goals Patient Stated Goal: to go home OT Goal Formulation: With patient Time For Goal Achievement: 07/05/17 Potential to Achieve Goals: Good ADL Goals Pt Will Perform Grooming: with supervision;standing Pt Will Transfer to Toilet: with min guard assist;bedside commode;ambulating Pt Will Perform Toileting - Clothing Manipulation and hygiene: with min guard assist;sit to/from stand Additional ADL Goal #1: pt will perform bed mobility at min guard level in preparation for adls and toilet transfers  OT Frequency: Min 2X/week   Barriers to D/C:            Co-evaluation   Reason for Co-Treatment: For patient/therapist safety PT goals addressed during session: Mobility/safety with mobility OT goals addressed during session: ADL's and self-care      AM-PAC PT "6 Clicks" Daily Activity     Outcome Measure Help from another person eating meals?: None Help from  another person taking care of personal grooming?: A Little Help from another person toileting, which includes using toliet, bedpan, or urinal?: A Lot Help from another person bathing (including washing, rinsing, drying)?: A Lot Help from another person to put on and taking off regular upper body clothing?: A Little Help from another person to put on and taking off regular lower body clothing?: A Lot 6 Click Score: 16   End of Session    Activity Tolerance: Patient tolerated treatment well Patient left: in chair;with call bell/phone within reach;with chair alarm set;with family/visitor present  OT Visit Diagnosis: Pain Pain - Right/Left: Right Pain - part of body: Hip                Time: 1655-3748 OT Time Calculation (min): 28 min Charges:  OT General Charges $OT Visit: 1 Visit OT Evaluation $OT Eval Low Complexity: 1 Low G-Codes:     [place], OTR/L [phone] 06/28/2017  Okla Qazi 06/28/2017, 12:42 PM

## 2017-06-28 NOTE — Progress Notes (Signed)
PROGRESS NOTE  [name] KNL:[id number] DOB: [birth year] DOA: 06/26/2017 PCP: Prince Solian, MD   LOS: 1 day   Brief Narrative / Interim history: 71 year-old female with PMH significant for chronic pain syndrome and osteoporosis presented to the ED last night with right hip pain following a mechanical fall from a chair onto the floor. Imaging shows subcapital right hip fracture with slight impaction and no associated pelvic fracture. Ortho consulted with surgery yesterday. Pre-op labs and CXR ordered in ED, all negative. No cp, SOB, syncope, n/v/d. Pt not on AC or anti-platelet agents. Incidental finding of afib during this admission, currently on diltiazem.   Assessment & Plan: Principal Problem:   Closed right hip fracture, initial encounter ([place]) Active Problems:   HTN (hypertension)   Irregular heart rate   Persistent atrial fibrillation (HCC)   Hip fracture (HCC)   Persistent atrial fibrillation Pt in previously undiagnosed persistent afib. She is asymptomatic, but heart rate did range up to 117 overnight. Pt was started on IV diltiazem with successful rate control. Cardiology following, echo pending. Plan to transition to PO diltiazem today. CHA2DS2VASC=5. Will discharge home on [facility].   Hypertension Pt on lisinopril at home. She had an episode of hypotension after her dose this morning. Considering that we have started diltiazem, will hold lisinopril for now. Continue to monitor vital signs.   Right subcapital hip fracture Surgery yesterday. Surgeon following. Pt reports adequate pain relief with fentanyl patch, Norco, morphine, and Robaxin. Will continue. Will need PT evaluation.  Chronic pain syndrome Pt on multiple pain medications at home. Continuing pain management as above.   Depression On Prozac and Wellbutrin, will continue.  Dyslipidemia On Lipitor, will continue.     DVT prophylaxis: Lovenox Code Status: full code Family Communication: husband at  bedside, all concerns addressed Disposition Plan: home pending PT recs  Consultants:   Cardiology  orthopedics  Procedures:     Antimicrobials:     Subjective: Pt feeling well today, again states that pain is controlled. No cp, palpitations, dyspnea, or n/v/d.   Objective: Vitals:   06/28/17 0400 06/28/17 0600 06/28/17 0800 06/28/17 1000  BP: 130/78 (!) 123/57 114/87 (!) 89/63  Pulse: 91 82 84 (!) 107  Resp: 14 15 14 17   Temp: 98.4 F (36.9 C)  99.1 F (37.3 C)   TempSrc: Axillary  Oral   SpO2: 95% 100% 95% 91%  Weight:      Height:        Intake/Output Summary (Last 24 hours) at 06/28/2017 1024 Last data filed at 06/28/2017 1000 Gross per 24 hour  Intake 3490.04 ml  Output 2525 ml  Net 965.04 ml   Filed Weights   06/26/17 2042  Weight: 57.2 kg (126 lb)    Examination:  Constitutional: non-distressed Eyes: lids and conjunctivae normal ENMT: Mucous membranes are moist.  Neck: normal, supple, no masses Respiratory: clear to auscultation bilaterally, no wheezing, no crackles. Normal respiratory effort. No accessory muscle use.  Cardiovascular: Irregularly irregular, S1, S2, no m/g/r. No LE edema. 2+ pedal pulses.  Abdomen: no tenderness. Bowel sounds positive.  Musculoskeletal: no clubbing / cyanosis. No joint deformity upper and lower extremities. No contractures. Normal muscle tone.  Skin: no rashes, lesions, ulcers.  Neurologic: Grossly non-focal Psychiatric: Normal judgment and insight. Alert and oriented x 3. Normal mood.    Data Reviewed: I have independently reviewed following labs and imaging studies   CBC: Recent Labs  Lab 06/26/17 1624 06/27/17 0549 06/28/17 0337  WBC 7.3  5.4 10.2  HGB 12.7 12.3 11.8*  HCT 38.8 37.7 36.0  MCV 90.2 91.5 91.6  PLT 308 256 818   Basic Metabolic Panel: Recent Labs  Lab 06/26/17 1624 06/27/17 0549 06/28/17 0337  NA 140 138 137  K 4.2 3.5 3.5  CL 100* 102 103  CO2 29 26 23   GLUCOSE 106* 105* 145*    BUN 13 13 12   CREATININE 0.94 0.85 0.76  CALCIUM 9.5 8.9 8.7*   GFR: Estimated Creatinine Clearance: 57.4 mL/min (by C-G formula based on SCr of 0.76 mg/dL). Liver Function Tests: No results for input(s): AST, ALT, ALKPHOS, BILITOT, PROT, ALBUMIN in the last 168 hours. No results for input(s): LIPASE, AMYLASE in the last 168 hours. No results for input(s): AMMONIA in the last 168 hours. Coagulation Profile: Recent Labs  Lab 06/26/17 1624  INR 1.05   Cardiac Enzymes: No results for input(s): CKTOTAL, CKMB, CKMBINDEX, TROPONINI in the last 168 hours. BNP (last 3 results) No results for input(s): PROBNP in the last 8760 hours. HbA1C: No results for input(s): HGBA1C in the last 72 hours. CBG: Recent Labs  Lab 06/27/17 2355 06/28/17 0537  GLUCAP 140* 124*   Lipid Profile: No results for input(s): CHOL, HDL, LDLCALC, TRIG, CHOLHDL, LDLDIRECT in the last 72 hours. Thyroid Function Tests: No results for input(s): TSH, T4TOTAL, FREET4, T3FREE, THYROIDAB in the last 72 hours. Anemia Panel: No results for input(s): VITAMINB12, FOLATE, FERRITIN, TIBC, IRON, RETICCTPCT in the last 72 hours. Urine analysis: No results found for: COLORURINE, APPEARANCEUR, LABSPEC, PHURINE, GLUCOSEU, HGBUR, BILIRUBINUR, KETONESUR, PROTEINUR, UROBILINOGEN, NITRITE, LEUKOCYTESUR Sepsis Labs: Invalid input(s): PROCALCITONIN, LACTICIDVEN  Recent Results (from the past 240 hour(s))  Surgical pcr screen     Status: Abnormal   Collection Time: 06/26/17 11:41 PM  Result Value Ref Range Status   MRSA, PCR NEGATIVE NEGATIVE Final   Staphylococcus aureus POSITIVE (A) NEGATIVE Final    Comment: (NOTE) The Xpert SA Assay (FDA approved for NASAL specimens in patients 22 years of age and older), is one component of a comprehensive surveillance program. It is not intended to diagnose infection nor to guide or monitor treatment.   MRSA PCR Screening     Status: None   Collection Time: 06/27/17 11:34 PM  Result  Value Ref Range Status   MRSA by PCR NEGATIVE NEGATIVE Final    Comment:        The GeneXpert MRSA Assay (FDA approved for NASAL specimens only), is one component of a comprehensive MRSA colonization surveillance program. It is not intended to diagnose MRSA infection nor to guide or monitor treatment for MRSA infections.       Radiology Studies: Dg Chest 1 View  Result Date: 06/26/2017 CLINICAL DATA:  Slipped and fell today.  Right hip pain. EXAM: CHEST 1 VIEW COMPARISON:  Report of a chest x-ray dated November 22, 2000 FINDINGS: The lungs are mildly hyperinflated. There is no focal infiltrate. The upper lobe interstitial markings are coarse. The heart is top-normal in size. The pulmonary vascularity is not engorged. There is no pleural effusion or pneumothorax. The observed bony thorax exhibits no acute abnormality. IMPRESSION: Chronic bronchitic changes. No pneumonia, CHF, nor evidence of acute post traumatic injury. Electronically Signed   By: David  Martinique M.D.   On: 06/26/2017 15:52   Pelvis Portable  Result Date: 06/27/2017 CLINICAL DATA:  Postop hip fracture EXAM: PORTABLE PELVIS 1-2 VIEWS COMPARISON:  06/26/2017 FINDINGS: Postsurgical changes in the lumbar spine. Pubic symphysis and rami are intact. Interval right  hip replacement with normal alignment. Gas in the soft tissues consistent with recent surgical status IMPRESSION: Interval right hip replacement with expected postsurgical changes Electronically Signed   By: Donavan Foil M.D.   On: 06/27/2017 21:19   Dg C-arm 1-60 Min-no Report  Result Date: 06/27/2017 Fluoroscopy was utilized by the requesting physician.  No radiographic interpretation.   Dg Hip Unilat W Or Wo Pelvis 2-3 Views Right  Result Date: 06/26/2017 CLINICAL DATA:  Slipped at 2 a.m. walking to the bathroom, fell, RIGHT hip pain. EXAM: DG HIP (WITH OR WITHOUT PELVIS) 2-3V RIGHT COMPARISON:  None. FINDINGS: There is a subcapital RIGHT hip fracture with slight  impaction. BILATERAL hip joint space narrowing. No pelvic fracture. Vascular calcification. IMPRESSION: Subcapital RIGHT hip fracture with slight impaction. Electronically Signed   By: Staci Righter M.D.   On: 06/26/2017 15:51     Scheduled Meds: . aspirin EC  81 mg Oral BID  . atorvastatin  10 mg Oral Daily  . buPROPion  150 mg Oral Daily  . diltiazem  30 mg Oral Q6H  . docusate sodium  100 mg Oral BID  . feeding supplement  1 Container Oral TID BM  . fentaNYL  100 mcg Transdermal QODAY  . ferrous sulfate  325 mg Oral BID WC  . FLUoxetine  20 mg Oral QHS  . levothyroxine  88 mcg Oral QAC breakfast  . lisinopril  40 mg Oral Daily  . loratadine  10 mg Oral Daily  . pantoprazole  40 mg Oral Daily   Continuous Infusions: . sodium chloride 75 mL/hr at 06/28/17 0843  . methocarbamol (ROBAXIN)  IV Stopped (06/27/17 2131)       Time spent: 15 minutes    Katie Amel, [name] 06/28/2017, 10:24 AM   @[email or web address] @

## 2017-06-28 NOTE — Care Management Note (Signed)
Case Management Note  Patient Details  Name: [name] MRN: [id number] Date of Birth: 11-04-1945  Subjective/Objective:                  thr rt  Action/Plan: Date: June 28, 2017 Velva Harman, BSN, [place], [facility] Chart and notes review for patient progress and needs. Will follow for case management and discharge needs. Next review date: [id number]  Expected Discharge Date:  (unknown)               Expected Discharge Plan:  Home/Self Care  In-House Referral:  Clinical Social Work  Discharge planning Services  CM Consult  Post Acute Care Choice:    Choice offered to:     DME Arranged:    DME Agency:     HH Arranged:    [place] Agency:     Status of Service:  In process, will continue to follow  If discussed at Long Length of Stay Meetings, dates discussed:    Additional Comments:  Leeroy Cha, RN 06/28/2017, 8:27 AM

## 2017-06-29 LAB — GLUCOSE, CAPILLARY
Glucose-Capillary: 136 mg/dL — ABNORMAL HIGH (ref 65–99)
Glucose-Capillary: 144 mg/dL — ABNORMAL HIGH (ref 65–99)
Glucose-Capillary: 164 mg/dL — ABNORMAL HIGH (ref 65–99)
Glucose-Capillary: 90 mg/dL (ref 65–99)

## 2017-06-29 MED ORDER — METHOCARBAMOL 500 MG PO TABS: 500.0000 mg | ORAL_TABLET | Freq: Four times a day (QID) | ORAL | 0 refills | Status: DC | PRN

## 2017-06-29 MED ORDER — FENTANYL 100 MCG/HR TD PT72
100.0000 ug | MEDICATED_PATCH | TRANSDERMAL | Status: DC
  Administered 2017-06-29: 100 ug via TRANSDERMAL
  Filled 2017-06-29: qty 1

## 2017-06-29 MED ORDER — HYDROCODONE-ACETAMINOPHEN 5-325 MG PO TABS: 1.0000 | ORAL_TABLET | Freq: Four times a day (QID) | ORAL | 0 refills | Status: DC | PRN

## 2017-06-29 MED ORDER — APIXABAN 5 MG PO TABS
5.0000 mg | ORAL_TABLET | Freq: Two times a day (BID) | ORAL | Status: DC
  Administered 2017-06-29 – 2017-06-30 (×3): 5 mg via ORAL
  Filled 2017-06-29 (×3): qty 1

## 2017-06-29 MED ORDER — ASPIRIN 81 MG PO TBEC: 81.0000 mg | DELAYED_RELEASE_TABLET | Freq: Two times a day (BID) | ORAL | 0 refills | Status: DC

## 2017-06-29 MED ORDER — DILTIAZEM HCL ER COATED BEADS 180 MG PO CP24
180.0000 mg | ORAL_CAPSULE | Freq: Every day | ORAL | Status: DC
  Administered 2017-06-29 – 2017-06-30 (×2): 180 mg via ORAL
  Filled 2017-06-29 (×2): qty 1

## 2017-06-29 NOTE — Progress Notes (Signed)
Received call from central monitoring that patient ran a 5 beat run of V tach. MD paged.

## 2017-06-29 NOTE — Discharge Instructions (Signed)
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
## 2017-06-29 NOTE — Progress Notes (Signed)
PROGRESS NOTE  [name]  YIR:[id number] DOB: March 27, 1946 DOA: 06/26/2017 PCP: Prince Solian, MD  Brief Narrative: 71 year-old female with PMH significant for chronic pain syndrome and osteoporosis presented to the ED last night with right hip pain following a mechanical fall from a chair onto the floor. Imaging shows subcapital right hip fracture with slight impaction and no associated pelvic fracture. Ortho consulted with surgery yesterday. Pre-op labs and CXR ordered in ED, all negative. No cp, SOB, syncope, n/v/d. Pt not on AC or anti-platelet agents. Incidental finding of afib during this admission, currently on diltiazem.  Assessment & Plan: Principal Problem:   Closed right hip fracture, initial encounter [facility]) Active Problems:   HTN (hypertension)   Irregular heart rate   Persistent atrial fibrillation (HCC)   Hip fracture (HCC)   Paroxysmal atrial fibrillation (HCC)   Anticoagulant long-term use  Right femoral neck fracture: s/p anterior right THA 11/7 by Dr. Alvan Dame. Uncomplicated recovery thus far, but has not gotten up.  - WBAT per Dr. Alvan Dame. PT to evaluate again today, initially recommended CIR but pt doesn't meet medical need. Refusing SNF for now.  - DC foley - Continue analgesics per orthopedics - Discuss with Dr. Alvan Dame Re: starting anticoagulation postop and stopping DVT ppx  New-onset atrial fibrillation: TTE unremarkable. - CHA2DS2-VASc is 5, discussed with patient who understands and agrees with need for anticoagulation. Discussed DOACs and settled on eliquis as she has no problem taking BID medication and prefers a slight decrease in GI bleeding. Will start this today and ask pharmacy to provide education. - Rate is controlled: will consolidate diltiazem to CR formulation per cardiology - Will need follow up with cardiology to discuss DCCV after adequate anticoagulation duration.   Essential HTN: Chronic, stable - Continue diltiazem and we're holding lisinopril  with normal BPs for now.   Chronic pain syndrome: - Continue pain medications.    Right (dominant) shoulder OA: Chronic, stable. With restricted ROM - Suggested PT as outpatient - Heating pad, ROM exercises encouraged.   Depression: Chronic, stable.  - Continue home wellbutrin, fluoxetine.   Dyslipidemia: Chronic, stable.  - Continue statin  DVT prophylaxis: Start DOAC Code Status: Full Family Communication: Husband at bedside Disposition Plan: Transfer to orthopedic floor on telemetry today, PT eval. Pt very reluctant to go home, declines SNF and not a CIR candidate. Will monitor overnight and DC home in AM if stable.   Consultants:   Orthopedics, [facility]  Cardiology, [facility]  Procedures:  06/27/2017, Dr. Alvan Dame: Right total hip replacement through an anterior approach utilizing DePuy THR system, component size 83mm pinnacle cup, a size 32+4 neutral Altrex liner, a size 5 standard offset Tri Lock stem with a 32+1 delta ceramic ball.   Antimicrobials:  Perioperative  Subjective: Pain is controlled at a 4, sometimes up to a 7 but responds to medications as ordered. No overnight events. Denies shortness of breath, chest pain, palpitations.   Objective: Vitals:   06/29/17 0400 06/29/17 0600 06/29/17 0607 06/29/17 0800  BP: 114/69 (!) 141/84 (!) 141/84   Pulse: 84 87    Resp: 15 15    Temp: 97.8 F (36.6 C)   (!) 97.4 F (36.3 C)  TempSrc: Oral   Oral  SpO2: 96% 90%    Weight:      Height:        Intake/Output Summary (Last 24 hours) at 06/29/2017 0907 Last data filed at 06/29/2017 0400 Gross per 24 hour  Intake 1427.5 ml  Output 1575  ml  Net -147.5 ml   Filed Weights   06/26/17 2042  Weight: 57.2 kg (126 lb)    Gen: 71 y.o. female in no distress Pulm: Non-labored breathing room air. Clear to auscultation bilaterally.  CV: Irreg irreg, rate in 80's. No murmur, rub, or gallop. No JVD, no pedal edema. GI: Abdomen soft, non-tender, non-distended, with normoactive  bowel sounds. No organomegaly or masses felt. Ext: Right thigh compartment soft, distal cap refill brisk, warm, DP pulse palpable, symmetric with left, motor 5/5, sensory intact. Right shoulder with restricted/painful ROM without palpable abnormality.  Skin: Right superior anterior/lateral thigh with dressing c/d/i.  Neuro: Alert and oriented. No focal neurological deficits. Psych: Judgement and insight appear normal. Mood & affect appropriate.   Data Reviewed: I have personally reviewed following labs and imaging studies  CBC: Recent Labs  Lab 06/26/17 1624 06/27/17 0549 06/28/17 0337  WBC 7.3 5.4 10.2  HGB 12.7 12.3 11.8*  HCT 38.8 37.7 36.0  MCV 90.2 91.5 91.6  PLT 308 256 540   Basic Metabolic Panel: Recent Labs  Lab 06/26/17 1624 06/27/17 0549 06/28/17 0337  NA 140 138 137  K 4.2 3.5 3.5  CL 100* 102 103  CO2 29 26 23   GLUCOSE 106* 105* 145*  BUN 13 13 12   CREATININE 0.94 0.85 0.76  CALCIUM 9.5 8.9 8.7*   GFR: Estimated Creatinine Clearance: 57.4 mL/min (by C-G formula based on SCr of 0.76 mg/dL). Liver Function Tests: No results for input(s): AST, ALT, ALKPHOS, BILITOT, PROT, ALBUMIN in the last 168 hours. No results for input(s): LIPASE, AMYLASE in the last 168 hours. No results for input(s): AMMONIA in the last 168 hours. Coagulation Profile: Recent Labs  Lab 06/26/17 1624  INR 1.05   Cardiac Enzymes: No results for input(s): CKTOTAL, CKMB, CKMBINDEX, TROPONINI in the last 168 hours. BNP (last 3 results) No results for input(s): PROBNP in the last 8760 hours. HbA1C: No results for input(s): HGBA1C in the last 72 hours. CBG: Recent Labs  Lab 06/28/17 0537 06/28/17 1201 06/28/17 1811 06/29/17 0024 06/29/17 0615  GLUCAP 124* 124* 151* 144* 90   Lipid Profile: No results for input(s): CHOL, HDL, LDLCALC, TRIG, CHOLHDL, LDLDIRECT in the last 72 hours. Thyroid Function Tests: No results for input(s): TSH, T4TOTAL, FREET4, T3FREE, THYROIDAB in the  last 72 hours. Anemia Panel: No results for input(s): VITAMINB12, FOLATE, FERRITIN, TIBC, IRON, RETICCTPCT in the last 72 hours. Urine analysis: No results found for: COLORURINE, APPEARANCEUR, LABSPEC, PHURINE, GLUCOSEU, HGBUR, BILIRUBINUR, KETONESUR, PROTEINUR, UROBILINOGEN, NITRITE, LEUKOCYTESUR Recent Results (from the past 240 hour(s))  Surgical pcr screen     Status: Abnormal   Collection Time: 06/26/17 11:41 PM  Result Value Ref Range Status   MRSA, PCR NEGATIVE NEGATIVE Final   Staphylococcus aureus POSITIVE (A) NEGATIVE Final    Comment: (NOTE) The Xpert SA Assay (FDA approved for NASAL specimens in patients 69 years of age and older), is one component of a comprehensive surveillance program. It is not intended to diagnose infection nor to guide or monitor treatment.   MRSA PCR Screening     Status: None   Collection Time: 06/27/17 11:34 PM  Result Value Ref Range Status   MRSA by PCR NEGATIVE NEGATIVE Final    Comment:        The GeneXpert MRSA Assay (FDA approved for NASAL specimens only), is one component of a comprehensive MRSA colonization surveillance program. It is not intended to diagnose MRSA infection nor to guide or monitor treatment for  MRSA infections.       Radiology Studies: Pelvis Portable  Result Date: 06/27/2017 CLINICAL DATA:  Postop hip fracture EXAM: PORTABLE PELVIS 1-2 VIEWS COMPARISON:  06/26/2017 FINDINGS: Postsurgical changes in the lumbar spine. Pubic symphysis and rami are intact. Interval right hip replacement with normal alignment. Gas in the soft tissues consistent with recent surgical status IMPRESSION: Interval right hip replacement with expected postsurgical changes Electronically Signed   By: Donavan Foil M.D.   On: 06/27/2017 21:19   Dg C-arm 1-60 Min-no Report  Result Date: 06/27/2017 Fluoroscopy was utilized by the requesting physician.  No radiographic interpretation.    Scheduled Meds: . apixaban  5 mg Oral BID  . aspirin  EC  81 mg Oral BID  . atorvastatin  10 mg Oral Daily  . buPROPion  150 mg Oral Daily  . diltiazem  180 mg Oral Daily  . docusate sodium  100 mg Oral BID  . feeding supplement  1 Container Oral TID BM  . fentaNYL  100 mcg Transdermal QODAY  . ferrous sulfate  325 mg Oral BID WC  . FLUoxetine  20 mg Oral QHS  . levothyroxine  88 mcg Oral QAC breakfast  . lisinopril  20 mg Oral Daily  . loratadine  10 mg Oral Daily  . multivitamin with minerals  1 tablet Oral Daily  . pantoprazole  40 mg Oral Daily   Continuous Infusions: . sodium chloride 75 mL/hr at 06/29/17 0400  . methocarbamol (ROBAXIN)  IV Stopped (06/27/17 2131)     LOS: 2 days   Time spent: 25 minutes.  Vance Gather, MD [facility] Pager [phone]  If 7PM-7AM, please contact night-coverage [email or web address] Password [email or web address] 06/29/2017, 9:07 AM

## 2017-06-29 NOTE — Progress Notes (Signed)
[place] for Apixaban Indication: atrial fibrillation  Allergies  Allergen Reactions  . Penicillins     Causes rash Has patient had a PCN reaction causing immediate rash, facial/tongue/throat swelling, SOB or lightheadedness with hypotension: YES Has patient had a PCN reaction causing severe rash involving mucus membranes or skin necrosis: No Has patient had a PCN reaction that required hospitalization No Has patient had a PCN reaction occurring within the last 10 years: No If all of the above answers are "NO", then may proceed with Cephalosporin use.    Patient Measurements: Height: 5\' 7"  (170.2 cm) Weight: 126 lb (57.2 kg) IBW/kg (Calculated) : 61.6  Vital Signs: Temp: 97.4 F (36.3 C) (11/09 0800) Temp Source: Oral (11/09 0800) BP: 141/84 (11/09 0607) Pulse Rate: 87 (11/09 0600)  Labs: Recent Labs    06/26/17 1624 06/27/17 0549 06/28/17 0337  HGB 12.7 12.3 11.8*  HCT 38.8 37.7 36.0  PLT 308 256 255  LABPROT 13.6  --   --   INR 1.05  --   --   CREATININE 0.94 0.85 0.76   Estimated Creatinine Clearance: 57.4 mL/min (by C-G formula based on SCr of 0.76 mg/dL).  Medications:  Scheduled:  . apixaban  5 mg Oral BID  . atorvastatin  10 mg Oral Daily  . buPROPion  150 mg Oral Daily  . diltiazem  180 mg Oral Daily  . docusate sodium  100 mg Oral BID  . feeding supplement  1 Container Oral TID BM  . fentaNYL  100 mcg Transdermal Q48H  . ferrous sulfate  325 mg Oral BID WC  . FLUoxetine  20 mg Oral QHS  . levothyroxine  88 mcg Oral QAC breakfast  . lisinopril  20 mg Oral Daily  . loratadine  10 mg Oral Daily  . multivitamin with minerals  1 tablet Oral Daily  . pantoprazole  40 mg Oral Daily   Assessment: POD2 s/p Hip fx repair THA anterior approach. Post-op course complicated by Afib, patient transferred to ICU.   Cards recommendation: begin Apixaban  Ortho aware, may begin full dose Apixaban, Aspirin bid  discontinued  Goal of Therapy:  Monitor platelets by anticoagulation protocol: Yes   Plan:   Apixaban 5mg  bid  Patient/husband counselled  Monitor for s/s bleed  Minda Ditto PharmD Pager [phone] 06/29/2017, 10:19 AM

## 2017-06-29 NOTE — Progress Notes (Signed)
     Subjective: 2 Days Post-Op Procedure(s) (LRB): TOTAL HIP ARTHROPLASTY ANTERIOR APPROACH (Right)   Patient reports pain as mild, pain controlled with regards to the hip.  She did have an incident with a-fib and was transferred to the ICU.  Orthopaedically she is stable.  Objective:   VITALS:   Vitals:   06/29/17 0600 06/29/17 0607  BP: (!) 141/84 (!) 141/84  Pulse: 87   Resp: 15   Temp:    SpO2: 90%     Dorsiflexion/Plantar flexion intact Incision: dressing C/D/I No cellulitis present Compartment soft  LABS Recent Labs    06/26/17 1624 06/27/17 0549 06/28/17 0337  HGB 12.7 12.3 11.8*  HCT 38.8 37.7 36.0  WBC 7.3 5.4 10.2  PLT 308 256 255    Recent Labs    06/26/17 1624 06/27/17 0549 06/28/17 0337  NA 140 138 137  K 4.2 3.5 3.5  BUN 13 13 12   CREATININE 0.94 0.85 0.76  GLUCOSE 106* 105* 145*     Assessment/Plan: 2 Days Post-Op Procedure(s) (LRB): TOTAL HIP ARTHROPLASTY ANTERIOR APPROACH (Right)  May transfer off of floor when stable per medicine Up with therapy Discharge home is the plan when ready, possible Saturday if stable    Ortho recommendations:  Eliquis per medicine.  Norco for pain management (Rx written).  Robaxin for muscle spasms (Rx written).  MiraLax and Colace for constipation  Iron 325 mg tid for 2-3 weeks   WBAT on the right leg.  Dressing to remain in place until follow in clinic in 2 weeks.  Dressing is waterproof and may shower with it in place.  Follow up in 2 weeks at [facility]. Follow up with OLIN,Demiyah Fischbach D in 2 weeks.  Contact information:  [facility] [address], [address] [place] [address]   PAC  06/29/2017, 7:56 AM

## 2017-06-29 NOTE — Progress Notes (Signed)
Occupational Therapy Treatment Patient Details Name: [name] MRN: [id number] DOB: Apr 28, 1946 Today's Date: 06/29/2017    History of present illness 71 yo female slipped from chair 06/26/17 and sustained subcapital  hip fracture on Right. S/P direct anterior THA on 06/27/17.  Post op afib., to SDU.  H/O CVA with visual field deficit   OT comments  Pt very motivated to walk. She had 7-9 pain and IV meds did not bring pain down.  Ambulated to bathroom to sit on 3:1 commode  Follow Up Recommendations  Supervision/Assistance - 24 hour    Equipment Recommendations  None recommended by OT    Recommendations for Other Services      Precautions / Restrictions Precautions Precautions: Fall Restrictions RLE Weight Bearing: Weight bearing as tolerated       Mobility Bed Mobility         Supine to sit: Min assist     General bed mobility comments: HOB raised  assist for RLE  Transfers   Equipment used: Rolling walker (2 wheeled)   Sit to Stand: Min assist         General transfer comment: steadying assistance to rise    Balance                                           ADL either performed or assessed with clinical judgement   ADL                           Toilet Transfer: Minimal assistance;Ambulation;BSC;RW             General ADL Comments: ambulated to bathroom with min guard assistance. Min, steadying assistance for sit to stand.  Pt had IV pain medication when sitting EOB, but pain was not managed. Pt did not want chair brought up to her. She is motivated to walk     Vision       Perception     Praxis      Cognition Arousal/Alertness: Awake/alert Behavior During Therapy: WFL for tasks assessed/performed Overall Cognitive Status: Within Functional Limits for tasks assessed                                          Exercises     Shoulder Instructions       General Comments pt's R shoulder  painful today. using heating pad    Pertinent Vitals/ Pain       Pain Score: 9  Pain Location: R hip with walking; 7 at baseline Pain Descriptors / Indicators: Aching Pain Intervention(s): Limited activity within patient's tolerance;Monitored during session;Repositioned;RN gave pain meds during session(IV meds given)  Home Living                                          Prior Functioning/Environment              Frequency  Min 2X/week        Progress Toward Goals  OT Goals(current goals can now be found in the care plan section)  Progress towards OT goals: Progressing toward goals     Plan  Co-evaluation                 AM-PAC PT "6 Clicks" Daily Activity     Outcome Measure   Help from another person eating meals?: None Help from another person taking care of personal grooming?: A Little Help from another person toileting, which includes using toliet, bedpan, or urinal?: A Little Help from another person bathing (including washing, rinsing, drying)?: A Lot Help from another person to put on and taking off regular upper body clothing?: A Little Help from another person to put on and taking off regular lower body clothing?: A Lot 6 Click Score: 17    End of Session    OT Visit Diagnosis: Pain Pain - Right/Left: Right Pain - part of body: Hip   Activity Tolerance Patient limited by pain   Patient Left in chair;with call bell/phone within reach;with chair alarm set;with family/visitor present   Nurse Communication          Time: 1012-1053(RN present for meds) OT Time Calculation (min): 41 min  Charges: OT General Charges $OT Visit: 1 Visit OT Treatments $Self Care/Home Management : 8-22 mins $Therapeutic Activity: 8-22 mins  Lesle Chris, OTR/L [phone] 06/29/2017   Camari Quintanilla 06/29/2017, 11:05 AM

## 2017-06-29 NOTE — Progress Notes (Signed)
Physical Therapy Treatment Patient Details Name: [name] MRN: [id number] DOB: 12/08/1945 Today's Date: 06/29/2017    History of Present Illness 71 yo female slipped from chair 06/26/17 and sustained subcapital  hip fracture on Right. S/P direct anterior THA on 06/27/17.  Post op afib., to SDU.  H/O CVA with visual field deficit    PT Comments    Patient is progressing.. Plans for Dc home. Has 7 steps to practice prior to DC.   Follow Up Recommendations  Home health PT     Equipment Recommendations  Rolling walker with 5" wheels    Recommendations for Other Services       Precautions / Restrictions Precautions Precaution Comments: monitor for Incr HR and BP-low Restrictions RLE Weight Bearing: Weight bearing as tolerated    Mobility  Bed Mobility   Bed Mobility: Sit to Supine       Sit to supine: Mod assist   General bed mobility comments: assist the right leg  Transfers Overall transfer level: Needs assistance Equipment used: Rolling walker (2 wheeled) Transfers: Sit to/from Stand Sit to Stand: Min assist         General transfer comment: steadying assistance to rise, cues for hand placement  Ambulation/Gait Ambulation/Gait assistance: Min assist Ambulation Distance (Feet): 80 Feet Assistive device: Rolling walker (2 wheeled) Gait Pattern/deviations: Antalgic     General Gait Details: cues for sequence   Stairs            Wheelchair Mobility    Modified Rankin (Stroke Patients Only)       Balance                                            Cognition Arousal/Alertness: Awake/alert                                            Exercises      General Comments        Pertinent Vitals/Pain Pain Score: 5  Pain Location: R hip with walking; Pain Descriptors / Indicators: Aching Pain Intervention(s): Monitored during session;Premedicated before session    Home Living                      Prior Function            PT Goals (current goals can now be found in the care plan section) Progress towards PT goals: Progressing toward goals    Frequency    Min 6X/week      PT Plan Discharge plan needs to be updated    Co-evaluation              AM-PAC PT "6 Clicks" Daily Activity  Outcome Measure  Difficulty turning over in bed (including adjusting bedclothes, sheets and blankets)?: Unable Difficulty moving from lying on back to sitting on the side of the bed? : Unable Difficulty sitting down on and standing up from a chair with arms (e.g., wheelchair, bedside commode, etc,.)?: A Lot Help needed moving to and from a bed to chair (including a wheelchair)?: A Lot Help needed walking in hospital room?: A Lot Help needed climbing 3-5 steps with a railing? : Total 6 Click Score: 9    End of Session   Activity  Tolerance: Patient tolerated treatment well Patient left: in bed;with call bell/phone within reach;with family/visitor present Nurse Communication: Mobility status PT Visit Diagnosis: Difficulty in walking, not elsewhere classified (R26.2) Pain - Right/Left: Right Pain - part of body: Hip     Time: 1341-1430 PT Time Calculation (min) (ACUTE ONLY): 49 min  Charges:  $Gait Training: 8-22 mins $Therapeutic Activity: 8-22 mins $Self Care/Home Management: 8-22                    G CodesTresa Endo PT [phone]    Claretha Cooper 06/29/2017, 4:37 PM

## 2017-06-30 LAB — GLUCOSE, CAPILLARY: Glucose-Capillary: 89 mg/dL (ref 65–99)

## 2017-06-30 MED ORDER — APIXABAN 5 MG PO TABS: 5.0000 mg | ORAL_TABLET | Freq: Two times a day (BID) | ORAL | 0 refills | Status: DC

## 2017-06-30 MED ORDER — FERROUS SULFATE 325 (65 FE) MG PO TABS: 325.0000 mg | ORAL_TABLET | Freq: Two times a day (BID) | ORAL | 0 refills | Status: DC

## 2017-06-30 MED ORDER — DILTIAZEM HCL ER COATED BEADS 180 MG PO CP24: 180.0000 mg | ORAL_CAPSULE | Freq: Every day | ORAL | 0 refills | Status: DC

## 2017-06-30 MED ORDER — QUINAPRIL HCL 40 MG PO TABS: 20.0000 mg | ORAL_TABLET | Freq: Every day | ORAL | Status: DC

## 2017-06-30 NOTE — Care Management Note (Addendum)
Case Management Note  Patient Details  Name: FLORELLA MCNEESE MRN: [id number] Date of Birth: 1945/11/18  Subjective/Objective:     Closed right hip fracture, s/p direct anterior THA               Action/Plan: Discharge Planning: NCM spoke to pt and husband at bedside. Offered choice for HH/list provided. Pt requesting RW for home. Contacted [facility] for RW and new [place] referral. Notified [facility] of pt's choice for [facility]. Pt requested dc summary be sent to her PCP. Provided pt with Eliquis 30 day free trial card. Her pharmacy do have in stock. They are open until 4 pm.   PCP Prince Solian MD  Expected Discharge Date:  06/30/17               Expected Discharge Plan:  [place]  In-House Referral:  Clinical Social Work  Discharge planning Services  CM Consult  Post Acute Care Choice:  Home Health Choice offered to:  Patient  DME Arranged:  Walker rolling DME Agency:  [place] Arranged:  RN, PT, OT, Nurse's Aide, Social Work [employer] Agency:  [place]  Status of Service:  Completed, signed off  If discussed at [employer], dates discussed:    Additional Comments:  Erenest Rasher, RN 06/30/2017, 10:32 AM

## 2017-06-30 NOTE — Progress Notes (Signed)
Per PT note patient needing to practice stairs with PT prior to discharge.  PT paged.

## 2017-06-30 NOTE — Progress Notes (Signed)
Discharge instructions and medications discussed with patient and husband.  AVS and prescription given to patient.  All questions answered.  Patient is waiting for RW to be delivered to hospital.

## 2017-06-30 NOTE — Progress Notes (Signed)
Physical Therapy Treatment Patient Details Name: [name] MRN: [id number] DOB: 11/24/1945 Today's Date: 06/30/2017    History of Present Illness 71 yo female slipped from chair 06/26/17 and sustained subcapital  hip fracture on Right. S/P direct anterior THA on 06/27/17.  Post op afib., to SDU.  H/O CVA with visual field deficit    PT Comments    Ready for Dc.   Follow Up Recommendations  Home health PT     Equipment Recommendations  Rolling walker with 5" wheels    Recommendations for Other Services       Precautions / Restrictions Precautions Precautions: Fall Restrictions RLE Weight Bearing: Weight bearing as tolerated    Mobility  Bed Mobility Overal bed mobility: Needs Assistance             General bed mobility comments: in recliner  Transfers Overall transfer level: Needs assistance Equipment used: Rolling walker (2 wheeled) Transfers: Sit to/from Stand Sit to Stand: Min guard         General transfer comment: steadying assistance to rise, cues for hand placement  Ambulation/Gait Ambulation/Gait assistance: Min guard Ambulation Distance (Feet): 80 Feet Assistive device: Rolling walker (2 wheeled) Gait Pattern/deviations: Antalgic     General Gait Details: cues for sequence   Stairs Stairs: Yes   Stair Management: One rail Left;Backwards;Forwards;With walker   General stair comments: spouse assisted backwards up 2 with RW, practiced x 2 up 2-3 with rail and cane.   Wheelchair Mobility    Modified Rankin (Stroke Patients Only)       Balance                                            Cognition Arousal/Alertness: Awake/alert                                            Exercises  Provided written instructions . Spouse familiar with HEP    General Comments        Pertinent Vitals/Pain Pain Score: 3  Pain Location: R hip with walking; Pain Descriptors / Indicators: Aching Pain  Intervention(s): Premedicated before session    Home Living                      Prior Function            PT Goals (current goals can now be found in the care plan section) Progress towards PT goals: Progressing toward goals    Frequency    Min 6X/week      PT Plan Current plan remains appropriate    Co-evaluation              AM-PAC PT "6 Clicks" Daily Activity  Outcome Measure  Difficulty turning over in bed (including adjusting bedclothes, sheets and blankets)?: A Little Difficulty moving from lying on back to sitting on the side of the bed? : A Little Difficulty sitting down on and standing up from a chair with arms (e.g., wheelchair, bedside commode, etc,.)?: A Little Help needed moving to and from a bed to chair (including a wheelchair)?: A Little Help needed walking in hospital room?: A Little Help needed climbing 3-5 steps with a railing? : A Lot 6 Click Score: 17  End of Session   Activity Tolerance: Patient tolerated treatment well Patient left: in chair;with call bell/phone within reach;with family/visitor present Nurse Communication: Mobility status       Time: 1224-4975 PT Time Calculation (min) (ACUTE ONLY): 28 min  Charges:  $Gait Training: 23-37 mins                    G Codes:          [name] 06/30/2017, 4:56 PM

## 2017-06-30 NOTE — Discharge Summary (Signed)
Physician Discharge Summary  [name] NWG:[id number] DOB: 1945/11/05 DOA: 06/26/2017  PCP: Prince Solian, MD  Admit date: 06/26/2017 Discharge date: 06/30/2017  Admitted From:home Disposition:home with home services  Recommendations for Outpatient Follow-up:  1. Follow up with PCP in 1-2 weeks 2. Please obtain BMP/CBC in one week  Home Health:yes Equipment/Devices:walker Discharge Condition:stable CODE STATUS:full code Diet recommendation:heart healthy  Brief/Interim Summary: 71 year old female with history of hypertension, hyperlipidemia presented after a fall from a chair with a right hip fracture.  Patient was evaluated by orthopedics and underwent hip surgery.  Postsurgical event unremarkable.  On admission patient was found to have EKG with atrial fibrillation.  Patient likely with persistent atrial fibrillation evaluated by cardiologist.  Required diltiazem drip briefly for A. fib with RVR.  Started on Eliquis for anticoagulation.  Patient was evaluated by PT and OT.  Family and patient wants to go home with home care services and does not like to go to skilled facility.  Clinically improved, pain is controlled heart rate and blood pressure improved.  Changes were made in medication including lowered the dose of quinapril and is started on long-acting diltiazem orally because of A. fib.  Patient and her husband both understand outpatient follow-up with cardiology, orthopedics and PCP.  Recommended to watch for any sign of bleeding and monitor blood pressure and heart rate at home.  Continue home care services on discharge.  Discharge Diagnoses:  Principal Problem:   Closed right hip fracture, initial encounter ([place]) Active Problems:   HTN (hypertension)   Irregular heart rate   Persistent atrial fibrillation (HCC)   Hip fracture (HCC)   Paroxysmal atrial fibrillation (HCC)   Anticoagulant long-term use    Discharge Instructions  Discharge Instructions    Call MD  for:  difficulty breathing, headache or visual disturbances   Complete by:  As directed    Call MD for:  extreme fatigue   Complete by:  As directed    Call MD for:  hives   Complete by:  As directed    Call MD for:  persistant dizziness or light-headedness   Complete by:  As directed    Call MD for:  persistant nausea and vomiting   Complete by:  As directed    Call MD for:  redness, tenderness, or signs of infection (pain, swelling, redness, odor or green/yellow discharge around incision site)   Complete by:  As directed    Call MD for:  severe uncontrolled pain   Complete by:  As directed    Call MD for:  temperature >100.4   Complete by:  As directed    Diet - low sodium heart healthy   Complete by:  As directed    Increase activity slowly   Complete by:  As directed      Allergies as of 06/30/2017      Reactions   Penicillins    Causes rash Has patient had a PCN reaction causing immediate rash, facial/tongue/throat swelling, SOB or lightheadedness with hypotension: YES Has patient had a PCN reaction causing severe rash involving mucus membranes or skin necrosis: No Has patient had a PCN reaction that required hospitalization No Has patient had a PCN reaction occurring within the last 10 years: No If all of the above answers are "NO", then may proceed with Cephalosporin use.      Medication List    STOP taking these medications   felodipine 5 MG 24 hr tablet Commonly known as:  PLENDIL   ondansetron  4 MG disintegrating tablet Commonly known as:  ZOFRAN ODT     TAKE these medications   acetaminophen 500 MG tablet Commonly known as:  TYLENOL Take 1,000 mg by mouth every 8 (eight) hours as needed for mild pain.   apixaban 5 MG Tabs tablet Commonly known as:  ELIQUIS Take 1 tablet (5 mg total) 2 (two) times daily by mouth.   atorvastatin 10 MG tablet Commonly known as:  LIPITOR Take 10 mg by mouth daily.   buPROPion 150 MG 12 hr tablet Commonly known as:   WELLBUTRIN SR Take 150 mg by mouth daily.   diltiazem 180 MG 24 hr capsule Commonly known as:  CARDIZEM CD Take 1 capsule (180 mg total) daily by mouth. Start taking on:  07/01/2017   fentaNYL 100 MCG/HR Commonly known as:  [place] - dosed mcg/hr Place 1 patch onto the skin every other day.   ferrous sulfate 325 (65 FE) MG tablet Take 1 tablet (325 mg total) 2 (two) times daily with a meal by mouth.   FLUoxetine 20 MG capsule Commonly known as:  PROZAC Take 20 mg by mouth at bedtime.   fluticasone 50 MCG/ACT nasal spray Commonly known as:  FLONASE Place 1 spray into both nostrils daily as needed for allergies.   HYDROcodone-acetaminophen 5-325 MG tablet Commonly known as:  NORCO/VICODIN Take 1-2 tablets every 6 (six) hours as needed by mouth for moderate pain. What changed:    when to take this  Another medication with the same name was removed. Continue taking this medication, and follow the directions you see here.   hyoscyamine 0.125 MG SL tablet Commonly known as:  LEVSIN/SL Take 1-2 tablets by mouth every 6 hours as needed for abdominal cramping   levothyroxine 88 MCG tablet Commonly known as:  SYNTHROID, LEVOTHROID Take 88 mcg by mouth daily.   loratadine 10 MG tablet Commonly known as:  CLARITIN Take 10 mg daily by mouth.   methocarbamol 500 MG tablet Commonly known as:  ROBAXIN Take 1 tablet (500 mg total) every 6 (six) hours as needed by mouth for muscle spasms.   omeprazole 40 MG capsule Commonly known as:  PRILOSEC Take 40 mg by mouth 2 (two) times daily.   ondansetron 4 MG tablet Commonly known as:  ZOFRAN Take 1 tablet (4 mg total) by mouth every 8 (eight) hours as needed for nausea.   quinapril 40 MG tablet Commonly known as:  ACCUPRIL Take 0.5 tablets (20 mg total) daily by mouth. What changed:  how much to take   rOPINIRole 1 MG tablet Commonly known as:  REQUIP Take 1 mg by mouth at bedtime as needed (restless leg).   VITAMIN D  PO Take 1 capsule by mouth at bedtime. Take on   zolpidem 10 MG tablet Commonly known as:  AMBIEN Take 5-10 mg by mouth at bedtime as needed for sleep.            Durable Medical Equipment  (From admission, onward)        Start     Ordered   06/30/17 1021  For home use only DME Walker rolling  [facility])  Once    Question:  Patient needs a walker to treat with the following condition  Answer:  Hip fracture ([place])   06/30/17 1021     Follow-up Information    Avva, Ravisankar, MD. Schedule an appointment as soon as possible for a visit in 1 week(s).   Specialty:  Internal Medicine Contact information: [address] [address] [phone]        Almyra Deforest, [place] Follow up on 07/11/2017.   Specialties:  Cardiology, Radiology Why:  Please arrive at 10:15 for a 10:30 am appt. Contact information: [address] [place] [address] [phone]        Paralee Cancel, MD. Schedule an appointment as soon as possible for a visit in 2 week(s).   Specialty:  Orthopedic Surgery Contact information: [address] [place] [address] [phone]          Allergies  Allergen Reactions  . Penicillins     Causes rash Has patient had a PCN reaction causing immediate rash, facial/tongue/throat swelling, SOB or lightheadedness with hypotension: YES Has patient had a PCN reaction causing severe rash involving mucus membranes or skin necrosis: No Has patient had a PCN reaction that required hospitalization No Has patient had a PCN reaction occurring within the last 10 years: No If all of the above answers are "NO", then may proceed with Cephalosporin use.     Consultations: Cardiology Orthopedics  Procedures/Studies: Echo  Subjective: Seen and examined at bedside.  Denies headache, dizziness, nausea vomiting chest pain shortness of breath.  Discharge Exam: Vitals:   06/29/17 2025 06/30/17 0534  BP: (!) 100/50 128/65   Pulse: 60 72  Resp: 18 18  Temp: 98.7 F (37.1 C) 98.3 F (36.8 C)  SpO2: 98% 95%   Vitals:   06/29/17 1517 06/29/17 1628 06/29/17 2025 06/30/17 0534  BP: (!) 98/45 (!) 121/51 (!) 100/50 128/65  Pulse: 60 (!) 110 60 72  Resp: 17  18 18   Temp:  98.3 F (36.8 C) 98.7 F (37.1 C) 98.3 F (36.8 C)  TempSrc:  Oral Oral Oral  SpO2: 99% 100% 98% 95%  Weight:      Height:        General: Pt is alert, awake, not in acute distress Cardiovascular: RRR, S1/S2 +, no rubs, no gallops Respiratory: CTA bilaterally, no wheezing, no rhonchi Abdominal: Soft, NT, ND, bowel sounds + Extremities: no edema, no cyanosis    The results of significant diagnostics from this hospitalization (including imaging, microbiology, ancillary and laboratory) are listed below for reference.     Microbiology: Recent Results (from the past 240 hour(s))  Surgical pcr screen     Status: Abnormal   Collection Time: 06/26/17 11:41 PM  Result Value Ref Range Status   MRSA, PCR NEGATIVE NEGATIVE Final   Staphylococcus aureus POSITIVE (A) NEGATIVE Final    Comment: (NOTE) The Xpert SA Assay (FDA approved for NASAL specimens in patients 29 years of age and older), is one component of a comprehensive surveillance program. It is not intended to diagnose infection nor to guide or monitor treatment.   MRSA PCR Screening     Status: None   Collection Time: 06/27/17 11:34 PM  Result Value Ref Range Status   MRSA by PCR NEGATIVE NEGATIVE Final    Comment:        The GeneXpert MRSA Assay (FDA approved for NASAL specimens only), is one component of a comprehensive MRSA colonization surveillance program. It is not intended to diagnose MRSA infection nor to guide or monitor treatment for MRSA infections.      Labs: BNP (last 3 results) No results for input(s): BNP in the last 8760 hours. Basic Metabolic Panel: Recent Labs  Lab 06/26/17 1624 06/27/17 0549 06/28/17 0337  NA 140 138 137  K 4.2 3.5 3.5   CL  100* 102 103  CO2 29 26 23   GLUCOSE 106* 105* 145*  BUN 13 13 12   CREATININE 0.94 0.85 0.76  CALCIUM 9.5 8.9 8.7*   Liver Function Tests: No results for input(s): AST, ALT, ALKPHOS, BILITOT, PROT, ALBUMIN in the last 168 hours. No results for input(s): LIPASE, AMYLASE in the last 168 hours. No results for input(s): AMMONIA in the last 168 hours. CBC: Recent Labs  Lab 06/26/17 1624 06/27/17 0549 06/28/17 0337  WBC 7.3 5.4 10.2  HGB 12.7 12.3 11.8*  HCT 38.8 37.7 36.0  MCV 90.2 91.5 91.6  PLT 308 256 255   Cardiac Enzymes: No results for input(s): CKTOTAL, CKMB, CKMBINDEX, TROPONINI in the last 168 hours. BNP: Invalid input(s): POCBNP CBG: Recent Labs  Lab 06/28/17 1811 06/29/17 0024 06/29/17 0615 06/29/17 1154 06/29/17 1817  GLUCAP 151* 144* 90 164* 136*   D-Dimer No results for input(s): DDIMER in the last 72 hours. Hgb A1c No results for input(s): HGBA1C in the last 72 hours. Lipid Profile No results for input(s): CHOL, HDL, LDLCALC, TRIG, CHOLHDL, LDLDIRECT in the last 72 hours. Thyroid function studies No results for input(s): TSH, T4TOTAL, T3FREE, THYROIDAB in the last 72 hours.  Invalid input(s): FREET3 Anemia work up No results for input(s): VITAMINB12, FOLATE, FERRITIN, TIBC, IRON, RETICCTPCT in the last 72 hours. Urinalysis No results found for: COLORURINE, APPEARANCEUR, [place], [place], [place], [place], [place], [place], PROTEINUR, UROBILINOGEN, NITRITE, LEUKOCYTESUR Sepsis Labs Invalid input(s): PROCALCITONIN,  WBC,  LACTICIDVEN Microbiology Recent Results (from the past 240 hour(s))  Surgical pcr screen     Status: Abnormal   Collection Time: 06/26/17 11:41 PM  Result Value Ref Range Status   MRSA, PCR NEGATIVE NEGATIVE Final   Staphylococcus aureus POSITIVE (A) NEGATIVE Final    Comment: (NOTE) The Xpert SA Assay (FDA approved for NASAL specimens in patients 30 years of age and older), is one component of a  comprehensive surveillance program. It is not intended to diagnose infection nor to guide or monitor treatment.   MRSA PCR Screening     Status: None   Collection Time: 06/27/17 11:34 PM  Result Value Ref Range Status   MRSA by PCR NEGATIVE NEGATIVE Final    Comment:        The GeneXpert MRSA Assay (FDA approved for NASAL specimens only), is one component of a comprehensive MRSA colonization surveillance program. It is not intended to diagnose MRSA infection nor to guide or monitor treatment for MRSA infections.      Time coordinating discharge: 31 minutes  SIGNED:   Rosita Fire, MD  [facility] 06/30/2017, 10:24 AM  If 7PM-7AM, please contact night-coverage [email or web address] Password [email or web address]

## 2017-06-30 NOTE — Care Management Important Message (Signed)
Important Message  Patient Details  Name: [name] MRN: [id number] Date of Birth: 1946-02-15   Medicare Important Message Given:  Yes    Erenest Rasher, RN 06/30/2017, 10:45 AM

## 2017-06-30 NOTE — Progress Notes (Signed)
Per Dr. Carolin Sicks patient will not be going home with aspirin.  Patient will be going home with eliquis.

## 2017-07-02 DIAGNOSIS — Y92009 Unspecified place in unspecified non-institutional (private) residence as the place of occurrence of the external cause: Secondary | ICD-10-CM | POA: Diagnosis not present

## 2017-07-02 DIAGNOSIS — M150 Primary generalized (osteo)arthritis: Secondary | ICD-10-CM | POA: Diagnosis not present

## 2017-07-02 DIAGNOSIS — M545 Low back pain: Secondary | ICD-10-CM | POA: Diagnosis not present

## 2017-07-02 DIAGNOSIS — I480 Paroxysmal atrial fibrillation: Secondary | ICD-10-CM | POA: Diagnosis not present

## 2017-07-02 DIAGNOSIS — Z905 Acquired absence of kidney: Secondary | ICD-10-CM | POA: Diagnosis not present

## 2017-07-02 DIAGNOSIS — W07XXXD Fall from chair, subsequent encounter: Secondary | ICD-10-CM | POA: Diagnosis not present

## 2017-07-02 DIAGNOSIS — E039 Hypothyroidism, unspecified: Secondary | ICD-10-CM | POA: Diagnosis not present

## 2017-07-02 DIAGNOSIS — G2581 Restless legs syndrome: Secondary | ICD-10-CM | POA: Diagnosis not present

## 2017-07-02 DIAGNOSIS — N189 Chronic kidney disease, unspecified: Secondary | ICD-10-CM | POA: Diagnosis not present

## 2017-07-02 DIAGNOSIS — Z87891 Personal history of nicotine dependence: Secondary | ICD-10-CM | POA: Diagnosis not present

## 2017-07-02 DIAGNOSIS — I129 Hypertensive chronic kidney disease with stage 1 through stage 4 chronic kidney disease, or unspecified chronic kidney disease: Secondary | ICD-10-CM | POA: Diagnosis not present

## 2017-07-02 DIAGNOSIS — M80051D Age-related osteoporosis with current pathological fracture, right femur, subsequent encounter for fracture with routine healing: Secondary | ICD-10-CM | POA: Diagnosis not present

## 2017-07-02 DIAGNOSIS — G894 Chronic pain syndrome: Secondary | ICD-10-CM | POA: Diagnosis not present

## 2017-07-02 DIAGNOSIS — J449 Chronic obstructive pulmonary disease, unspecified: Secondary | ICD-10-CM | POA: Diagnosis not present

## 2017-07-02 DIAGNOSIS — K219 Gastro-esophageal reflux disease without esophagitis: Secondary | ICD-10-CM | POA: Diagnosis not present

## 2017-07-02 DIAGNOSIS — Z96641 Presence of right artificial hip joint: Secondary | ICD-10-CM | POA: Diagnosis not present

## 2017-07-02 NOTE — Telephone Encounter (Signed)
Pt d/c 06-30-17  Patient contacted regarding discharge from [facility] - 06/30/2017 (4 days) Patient understands to follow up with provider TCM/ [name] 07/11/2017 @10 :00am Patient understands discharge instructions? YES Patient understands medications and regiment? YES Patient understands to bring all medications to this visit? PT STATES THAT SHE WIII EITHER BRING HER MEDICATIONS OR AN UP TO DATE MEDICATION LIST TO APPT  PT GIVEN ADDRESS AN VERIFIED APPT TIME/DATE -SHE STATES THAT SHE WILL ARRIVE EARLY FOR CHECK-IN PROCESS

## 2017-07-03 DIAGNOSIS — I129 Hypertensive chronic kidney disease with stage 1 through stage 4 chronic kidney disease, or unspecified chronic kidney disease: Secondary | ICD-10-CM | POA: Diagnosis not present

## 2017-07-03 DIAGNOSIS — G2581 Restless legs syndrome: Secondary | ICD-10-CM | POA: Diagnosis not present

## 2017-07-03 DIAGNOSIS — M80051D Age-related osteoporosis with current pathological fracture, right femur, subsequent encounter for fracture with routine healing: Secondary | ICD-10-CM | POA: Diagnosis not present

## 2017-07-03 DIAGNOSIS — I480 Paroxysmal atrial fibrillation: Secondary | ICD-10-CM | POA: Diagnosis not present

## 2017-07-03 DIAGNOSIS — N189 Chronic kidney disease, unspecified: Secondary | ICD-10-CM | POA: Diagnosis not present

## 2017-07-03 DIAGNOSIS — Z96641 Presence of right artificial hip joint: Secondary | ICD-10-CM | POA: Diagnosis not present

## 2017-07-04 DIAGNOSIS — M80051D Age-related osteoporosis with current pathological fracture, right femur, subsequent encounter for fracture with routine healing: Secondary | ICD-10-CM | POA: Diagnosis not present

## 2017-07-04 DIAGNOSIS — I480 Paroxysmal atrial fibrillation: Secondary | ICD-10-CM | POA: Diagnosis not present

## 2017-07-04 DIAGNOSIS — I129 Hypertensive chronic kidney disease with stage 1 through stage 4 chronic kidney disease, or unspecified chronic kidney disease: Secondary | ICD-10-CM | POA: Diagnosis not present

## 2017-07-04 DIAGNOSIS — N189 Chronic kidney disease, unspecified: Secondary | ICD-10-CM | POA: Diagnosis not present

## 2017-07-04 DIAGNOSIS — Z96641 Presence of right artificial hip joint: Secondary | ICD-10-CM | POA: Diagnosis not present

## 2017-07-04 DIAGNOSIS — G2581 Restless legs syndrome: Secondary | ICD-10-CM | POA: Diagnosis not present

## 2017-07-05 DIAGNOSIS — I480 Paroxysmal atrial fibrillation: Secondary | ICD-10-CM | POA: Diagnosis not present

## 2017-07-05 DIAGNOSIS — G2581 Restless legs syndrome: Secondary | ICD-10-CM | POA: Diagnosis not present

## 2017-07-05 DIAGNOSIS — M80051D Age-related osteoporosis with current pathological fracture, right femur, subsequent encounter for fracture with routine healing: Secondary | ICD-10-CM | POA: Diagnosis not present

## 2017-07-05 DIAGNOSIS — N189 Chronic kidney disease, unspecified: Secondary | ICD-10-CM | POA: Diagnosis not present

## 2017-07-05 DIAGNOSIS — Z96641 Presence of right artificial hip joint: Secondary | ICD-10-CM | POA: Diagnosis not present

## 2017-07-05 DIAGNOSIS — I129 Hypertensive chronic kidney disease with stage 1 through stage 4 chronic kidney disease, or unspecified chronic kidney disease: Secondary | ICD-10-CM | POA: Diagnosis not present

## 2017-07-09 DIAGNOSIS — G2581 Restless legs syndrome: Secondary | ICD-10-CM | POA: Diagnosis not present

## 2017-07-09 DIAGNOSIS — N189 Chronic kidney disease, unspecified: Secondary | ICD-10-CM | POA: Diagnosis not present

## 2017-07-09 DIAGNOSIS — Z96641 Presence of right artificial hip joint: Secondary | ICD-10-CM | POA: Diagnosis not present

## 2017-07-09 DIAGNOSIS — I480 Paroxysmal atrial fibrillation: Secondary | ICD-10-CM | POA: Diagnosis not present

## 2017-07-09 DIAGNOSIS — I129 Hypertensive chronic kidney disease with stage 1 through stage 4 chronic kidney disease, or unspecified chronic kidney disease: Secondary | ICD-10-CM | POA: Diagnosis not present

## 2017-07-09 DIAGNOSIS — M80051D Age-related osteoporosis with current pathological fracture, right femur, subsequent encounter for fracture with routine healing: Secondary | ICD-10-CM | POA: Diagnosis not present

## 2017-07-10 DIAGNOSIS — M80051D Age-related osteoporosis with current pathological fracture, right femur, subsequent encounter for fracture with routine healing: Secondary | ICD-10-CM | POA: Diagnosis not present

## 2017-07-10 DIAGNOSIS — I129 Hypertensive chronic kidney disease with stage 1 through stage 4 chronic kidney disease, or unspecified chronic kidney disease: Secondary | ICD-10-CM | POA: Diagnosis not present

## 2017-07-10 DIAGNOSIS — Z96641 Presence of right artificial hip joint: Secondary | ICD-10-CM | POA: Diagnosis not present

## 2017-07-10 DIAGNOSIS — G2581 Restless legs syndrome: Secondary | ICD-10-CM | POA: Diagnosis not present

## 2017-07-10 DIAGNOSIS — I480 Paroxysmal atrial fibrillation: Secondary | ICD-10-CM | POA: Diagnosis not present

## 2017-07-10 DIAGNOSIS — N189 Chronic kidney disease, unspecified: Secondary | ICD-10-CM | POA: Diagnosis not present

## 2017-07-11 ENCOUNTER — Ambulatory Visit (INDEPENDENT_AMBULATORY_CARE_PROVIDER_SITE_OTHER): Payer: Medicare Other | Admitting: Physician Assistant

## 2017-07-11 ENCOUNTER — Encounter: Payer: Self-pay | Admitting: Physician Assistant

## 2017-07-11 VITALS — BP 130/74 | HR 82 | Ht 64.5 in | Wt 128.0 lb

## 2017-07-11 DIAGNOSIS — J449 Chronic obstructive pulmonary disease, unspecified: Secondary | ICD-10-CM

## 2017-07-11 DIAGNOSIS — I480 Paroxysmal atrial fibrillation: Secondary | ICD-10-CM | POA: Diagnosis not present

## 2017-07-11 DIAGNOSIS — R600 Localized edema: Secondary | ICD-10-CM | POA: Diagnosis not present

## 2017-07-11 DIAGNOSIS — E785 Hyperlipidemia, unspecified: Secondary | ICD-10-CM | POA: Diagnosis not present

## 2017-07-11 DIAGNOSIS — D649 Anemia, unspecified: Secondary | ICD-10-CM | POA: Diagnosis not present

## 2017-07-11 DIAGNOSIS — I10 Essential (primary) hypertension: Secondary | ICD-10-CM | POA: Diagnosis not present

## 2017-07-11 DIAGNOSIS — Z471 Aftercare following joint replacement surgery: Secondary | ICD-10-CM | POA: Diagnosis not present

## 2017-07-11 DIAGNOSIS — Z8673 Personal history of transient ischemic attack (TIA), and cerebral infarction without residual deficits: Secondary | ICD-10-CM

## 2017-07-11 DIAGNOSIS — E039 Hypothyroidism, unspecified: Secondary | ICD-10-CM

## 2017-07-11 DIAGNOSIS — N183 Chronic kidney disease, stage 3 unspecified: Secondary | ICD-10-CM

## 2017-07-11 DIAGNOSIS — I481 Persistent atrial fibrillation: Secondary | ICD-10-CM | POA: Diagnosis not present

## 2017-07-11 DIAGNOSIS — I4819 Other persistent atrial fibrillation: Secondary | ICD-10-CM

## 2017-07-11 DIAGNOSIS — Z96641 Presence of right artificial hip joint: Secondary | ICD-10-CM | POA: Diagnosis not present

## 2017-07-11 MED ORDER — DILTIAZEM HCL ER COATED BEADS 240 MG PO CP24: 240.0000 mg | ORAL_CAPSULE | Freq: Every day | ORAL | 0 refills | Status: DC
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
## 2017-07-11 NOTE — Patient Instructions (Signed)
Medication Instructions:  INCREASE Cardizem to 240 mg Take 1 tablet once a day for 7 days then go back to 180 mg dosage  Labwork: Your physician recommends that you return for lab work in: [facility]   Testing/Procedures: NONE   Follow-Up: Your physician recommends that you schedule a follow-up appointment in: 07-17-2017 AT 2PM WITH [name], PA-C  Any Other Special Instructions Will Be Listed Below (If Applicable).  If you need a refill on your cardiac medications before your next appointment, please call your pharmacy.

## 2017-07-12 ENCOUNTER — Encounter: Payer: Self-pay | Admitting: Physician Assistant

## 2017-07-12 LAB — CBC
Hematocrit: 29.1 % — ABNORMAL LOW (ref 34.0–46.6)
Hemoglobin: 9.7 g/dL — ABNORMAL LOW (ref 11.1–15.9)
MCH: 30.3 pg (ref 26.6–33.0)
MCHC: 33.3 g/dL (ref 31.5–35.7)
MCV: 91 fL (ref 79–97)
Platelets: 424 10*3/uL — ABNORMAL HIGH (ref 150–379)
RBC: 3.2 x10E6/uL — ABNORMAL LOW (ref 3.77–5.28)
RDW: 14.2 % (ref 12.3–15.4)
WBC: 6.6 10*3/uL (ref 3.4–10.8)

## 2017-07-13 DIAGNOSIS — G2581 Restless legs syndrome: Secondary | ICD-10-CM | POA: Diagnosis not present

## 2017-07-13 DIAGNOSIS — M80051D Age-related osteoporosis with current pathological fracture, right femur, subsequent encounter for fracture with routine healing: Secondary | ICD-10-CM | POA: Diagnosis not present

## 2017-07-13 DIAGNOSIS — I480 Paroxysmal atrial fibrillation: Secondary | ICD-10-CM | POA: Diagnosis not present

## 2017-07-13 DIAGNOSIS — N189 Chronic kidney disease, unspecified: Secondary | ICD-10-CM | POA: Diagnosis not present

## 2017-07-13 DIAGNOSIS — Z96641 Presence of right artificial hip joint: Secondary | ICD-10-CM | POA: Diagnosis not present

## 2017-07-13 DIAGNOSIS — I129 Hypertensive chronic kidney disease with stage 1 through stage 4 chronic kidney disease, or unspecified chronic kidney disease: Secondary | ICD-10-CM | POA: Diagnosis not present

## 2017-07-17 ENCOUNTER — Other Ambulatory Visit: Payer: Self-pay | Admitting: Physician Assistant

## 2017-07-17 ENCOUNTER — Encounter: Payer: Self-pay | Admitting: Physician Assistant

## 2017-07-17 ENCOUNTER — Ambulatory Visit (INDEPENDENT_AMBULATORY_CARE_PROVIDER_SITE_OTHER): Payer: Medicare Other | Admitting: Physician Assistant

## 2017-07-17 VITALS — BP 129/70 | HR 58 | Ht 66.5 in | Wt 127.0 lb

## 2017-07-17 DIAGNOSIS — Z96641 Presence of right artificial hip joint: Secondary | ICD-10-CM | POA: Diagnosis not present

## 2017-07-17 DIAGNOSIS — Z8673 Personal history of transient ischemic attack (TIA), and cerebral infarction without residual deficits: Secondary | ICD-10-CM | POA: Diagnosis not present

## 2017-07-17 DIAGNOSIS — I10 Essential (primary) hypertension: Secondary | ICD-10-CM

## 2017-07-17 DIAGNOSIS — E039 Hypothyroidism, unspecified: Secondary | ICD-10-CM | POA: Diagnosis not present

## 2017-07-17 DIAGNOSIS — E785 Hyperlipidemia, unspecified: Secondary | ICD-10-CM | POA: Diagnosis not present

## 2017-07-17 DIAGNOSIS — I481 Persistent atrial fibrillation: Secondary | ICD-10-CM | POA: Diagnosis not present

## 2017-07-17 DIAGNOSIS — G2581 Restless legs syndrome: Secondary | ICD-10-CM | POA: Diagnosis not present

## 2017-07-17 DIAGNOSIS — J449 Chronic obstructive pulmonary disease, unspecified: Secondary | ICD-10-CM | POA: Diagnosis not present

## 2017-07-17 DIAGNOSIS — I129 Hypertensive chronic kidney disease with stage 1 through stage 4 chronic kidney disease, or unspecified chronic kidney disease: Secondary | ICD-10-CM | POA: Diagnosis not present

## 2017-07-17 DIAGNOSIS — I4819 Other persistent atrial fibrillation: Secondary | ICD-10-CM

## 2017-07-17 DIAGNOSIS — I480 Paroxysmal atrial fibrillation: Secondary | ICD-10-CM | POA: Diagnosis not present

## 2017-07-17 DIAGNOSIS — D649 Anemia, unspecified: Secondary | ICD-10-CM | POA: Diagnosis not present

## 2017-07-17 DIAGNOSIS — M80051D Age-related osteoporosis with current pathological fracture, right femur, subsequent encounter for fracture with routine healing: Secondary | ICD-10-CM | POA: Diagnosis not present

## 2017-07-17 DIAGNOSIS — N189 Chronic kidney disease, unspecified: Secondary | ICD-10-CM | POA: Diagnosis not present

## 2017-07-17 DIAGNOSIS — R600 Localized edema: Secondary | ICD-10-CM

## 2017-07-17 MED ORDER — DILTIAZEM HCL ER COATED BEADS 240 MG PO CP24: 240.0000 mg | ORAL_CAPSULE | Freq: Every day | ORAL | 3 refills | Status: DC

## 2017-07-17 NOTE — Telephone Encounter (Signed)
°*  STAT* If patient is at the pharmacy, call can be transferred to refill team.   1. Which medications need to be refilled? (please list name of each medication and dose if known) Cardizem 240 mg   2. Which pharmacy/location (including street and city if local pharmacy) is medication to be sent to?[facility] in [place]   3. Do they need a 30 day or 90 day supply?[place]

## 2017-07-17 NOTE — Patient Instructions (Signed)
Katie Deforest, PA-C recommends that you continue on your current medications as directed. Please refer to the Current Medication list given to you today.  Your physician recommends that you return for lab work in [place].  Isaac Laud recommends that you schedule a follow-up appointment in 2-3 months with Dr Martinique.  If you need a refill on your cardiac medications before your next appointment, please call your pharmacy.
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
## 2017-07-17 NOTE — Telephone Encounter (Signed)
Rx(s) sent to pharmacy electronically.  

## 2017-07-18 LAB — CBC
Hematocrit: 29.9 % — ABNORMAL LOW (ref 34.0–46.6)
Hemoglobin: 9.6 g/dL — ABNORMAL LOW (ref 11.1–15.9)
MCH: 30.2 pg (ref 26.6–33.0)
MCHC: 32.1 g/dL (ref 31.5–35.7)
MCV: 94 fL (ref 79–97)
Platelets: 414 10*3/uL — ABNORMAL HIGH (ref 150–379)
RBC: 3.18 x10E6/uL — ABNORMAL LOW (ref 3.77–5.28)
RDW: 14.5 % (ref 12.3–15.4)
WBC: 4.9 10*3/uL (ref 3.4–10.8)

## 2017-07-19 ENCOUNTER — Encounter: Payer: Self-pay | Admitting: Physician Assistant

## 2017-07-19 DIAGNOSIS — Z0000 Encounter for general adult medical examination without abnormal findings: Secondary | ICD-10-CM | POA: Diagnosis not present

## 2017-07-19 DIAGNOSIS — N189 Chronic kidney disease, unspecified: Secondary | ICD-10-CM | POA: Diagnosis not present

## 2017-07-19 DIAGNOSIS — I129 Hypertensive chronic kidney disease with stage 1 through stage 4 chronic kidney disease, or unspecified chronic kidney disease: Secondary | ICD-10-CM | POA: Diagnosis not present

## 2017-07-19 DIAGNOSIS — G2581 Restless legs syndrome: Secondary | ICD-10-CM | POA: Diagnosis not present

## 2017-07-19 DIAGNOSIS — M80051D Age-related osteoporosis with current pathological fracture, right femur, subsequent encounter for fracture with routine healing: Secondary | ICD-10-CM | POA: Diagnosis not present

## 2017-07-19 DIAGNOSIS — Z96641 Presence of right artificial hip joint: Secondary | ICD-10-CM | POA: Diagnosis not present

## 2017-07-19 DIAGNOSIS — D500 Iron deficiency anemia secondary to blood loss (chronic): Secondary | ICD-10-CM | POA: Diagnosis not present

## 2017-07-19 DIAGNOSIS — I480 Paroxysmal atrial fibrillation: Secondary | ICD-10-CM | POA: Diagnosis not present

## 2017-07-23 DIAGNOSIS — N189 Chronic kidney disease, unspecified: Secondary | ICD-10-CM | POA: Diagnosis not present

## 2017-07-23 DIAGNOSIS — Z96641 Presence of right artificial hip joint: Secondary | ICD-10-CM | POA: Diagnosis not present

## 2017-07-23 DIAGNOSIS — I480 Paroxysmal atrial fibrillation: Secondary | ICD-10-CM | POA: Diagnosis not present

## 2017-07-23 DIAGNOSIS — I129 Hypertensive chronic kidney disease with stage 1 through stage 4 chronic kidney disease, or unspecified chronic kidney disease: Secondary | ICD-10-CM | POA: Diagnosis not present

## 2017-07-23 DIAGNOSIS — M80051D Age-related osteoporosis with current pathological fracture, right femur, subsequent encounter for fracture with routine healing: Secondary | ICD-10-CM | POA: Diagnosis not present

## 2017-07-23 DIAGNOSIS — G2581 Restless legs syndrome: Secondary | ICD-10-CM | POA: Diagnosis not present

## 2017-07-24 DIAGNOSIS — N189 Chronic kidney disease, unspecified: Secondary | ICD-10-CM | POA: Diagnosis not present

## 2017-07-24 DIAGNOSIS — I480 Paroxysmal atrial fibrillation: Secondary | ICD-10-CM | POA: Diagnosis not present

## 2017-07-24 DIAGNOSIS — I129 Hypertensive chronic kidney disease with stage 1 through stage 4 chronic kidney disease, or unspecified chronic kidney disease: Secondary | ICD-10-CM | POA: Diagnosis not present

## 2017-07-24 DIAGNOSIS — M80051D Age-related osteoporosis with current pathological fracture, right femur, subsequent encounter for fracture with routine healing: Secondary | ICD-10-CM | POA: Diagnosis not present

## 2017-07-24 DIAGNOSIS — G2581 Restless legs syndrome: Secondary | ICD-10-CM | POA: Diagnosis not present

## 2017-07-24 DIAGNOSIS — Z96641 Presence of right artificial hip joint: Secondary | ICD-10-CM | POA: Diagnosis not present

## 2017-07-25 DIAGNOSIS — I480 Paroxysmal atrial fibrillation: Secondary | ICD-10-CM | POA: Diagnosis not present

## 2017-07-25 DIAGNOSIS — Z96641 Presence of right artificial hip joint: Secondary | ICD-10-CM | POA: Diagnosis not present

## 2017-07-25 DIAGNOSIS — G2581 Restless legs syndrome: Secondary | ICD-10-CM | POA: Diagnosis not present

## 2017-07-25 DIAGNOSIS — I129 Hypertensive chronic kidney disease with stage 1 through stage 4 chronic kidney disease, or unspecified chronic kidney disease: Secondary | ICD-10-CM | POA: Diagnosis not present

## 2017-07-25 DIAGNOSIS — N189 Chronic kidney disease, unspecified: Secondary | ICD-10-CM | POA: Diagnosis not present

## 2017-07-25 DIAGNOSIS — M80051D Age-related osteoporosis with current pathological fracture, right femur, subsequent encounter for fracture with routine healing: Secondary | ICD-10-CM | POA: Diagnosis not present

## 2017-07-27 DIAGNOSIS — N189 Chronic kidney disease, unspecified: Secondary | ICD-10-CM | POA: Diagnosis not present

## 2017-07-27 DIAGNOSIS — I480 Paroxysmal atrial fibrillation: Secondary | ICD-10-CM | POA: Diagnosis not present

## 2017-07-27 DIAGNOSIS — G2581 Restless legs syndrome: Secondary | ICD-10-CM | POA: Diagnosis not present

## 2017-07-27 DIAGNOSIS — M80051D Age-related osteoporosis with current pathological fracture, right femur, subsequent encounter for fracture with routine healing: Secondary | ICD-10-CM | POA: Diagnosis not present

## 2017-07-27 DIAGNOSIS — I129 Hypertensive chronic kidney disease with stage 1 through stage 4 chronic kidney disease, or unspecified chronic kidney disease: Secondary | ICD-10-CM | POA: Diagnosis not present

## 2017-07-27 DIAGNOSIS — Z96641 Presence of right artificial hip joint: Secondary | ICD-10-CM | POA: Diagnosis not present

## 2017-07-31 DIAGNOSIS — I480 Paroxysmal atrial fibrillation: Secondary | ICD-10-CM | POA: Diagnosis not present

## 2017-07-31 DIAGNOSIS — I129 Hypertensive chronic kidney disease with stage 1 through stage 4 chronic kidney disease, or unspecified chronic kidney disease: Secondary | ICD-10-CM | POA: Diagnosis not present

## 2017-07-31 DIAGNOSIS — Z96641 Presence of right artificial hip joint: Secondary | ICD-10-CM | POA: Diagnosis not present

## 2017-07-31 DIAGNOSIS — G2581 Restless legs syndrome: Secondary | ICD-10-CM | POA: Diagnosis not present

## 2017-07-31 DIAGNOSIS — N189 Chronic kidney disease, unspecified: Secondary | ICD-10-CM | POA: Diagnosis not present

## 2017-07-31 DIAGNOSIS — M80051D Age-related osteoporosis with current pathological fracture, right femur, subsequent encounter for fracture with routine healing: Secondary | ICD-10-CM | POA: Diagnosis not present

## 2017-08-01 DIAGNOSIS — Z6820 Body mass index (BMI) 20.0-20.9, adult: Secondary | ICD-10-CM | POA: Diagnosis not present

## 2017-08-01 DIAGNOSIS — N390 Urinary tract infection, site not specified: Secondary | ICD-10-CM | POA: Diagnosis not present

## 2017-08-01 DIAGNOSIS — S40819A Abrasion of unspecified upper arm, initial encounter: Secondary | ICD-10-CM | POA: Diagnosis not present

## 2017-08-02 DIAGNOSIS — I129 Hypertensive chronic kidney disease with stage 1 through stage 4 chronic kidney disease, or unspecified chronic kidney disease: Secondary | ICD-10-CM | POA: Diagnosis not present

## 2017-08-02 DIAGNOSIS — G2581 Restless legs syndrome: Secondary | ICD-10-CM | POA: Diagnosis not present

## 2017-08-02 DIAGNOSIS — N189 Chronic kidney disease, unspecified: Secondary | ICD-10-CM | POA: Diagnosis not present

## 2017-08-02 DIAGNOSIS — M80051D Age-related osteoporosis with current pathological fracture, right femur, subsequent encounter for fracture with routine healing: Secondary | ICD-10-CM | POA: Diagnosis not present

## 2017-08-02 DIAGNOSIS — Z96641 Presence of right artificial hip joint: Secondary | ICD-10-CM | POA: Diagnosis not present

## 2017-08-02 DIAGNOSIS — I480 Paroxysmal atrial fibrillation: Secondary | ICD-10-CM | POA: Diagnosis not present

## 2017-08-06 DIAGNOSIS — M80051D Age-related osteoporosis with current pathological fracture, right femur, subsequent encounter for fracture with routine healing: Secondary | ICD-10-CM | POA: Diagnosis not present

## 2017-08-06 DIAGNOSIS — G2581 Restless legs syndrome: Secondary | ICD-10-CM | POA: Diagnosis not present

## 2017-08-06 DIAGNOSIS — I129 Hypertensive chronic kidney disease with stage 1 through stage 4 chronic kidney disease, or unspecified chronic kidney disease: Secondary | ICD-10-CM | POA: Diagnosis not present

## 2017-08-06 DIAGNOSIS — Z96641 Presence of right artificial hip joint: Secondary | ICD-10-CM | POA: Diagnosis not present

## 2017-08-06 DIAGNOSIS — I480 Paroxysmal atrial fibrillation: Secondary | ICD-10-CM | POA: Diagnosis not present

## 2017-08-06 DIAGNOSIS — N189 Chronic kidney disease, unspecified: Secondary | ICD-10-CM | POA: Diagnosis not present

## 2017-08-09 DIAGNOSIS — M80051D Age-related osteoporosis with current pathological fracture, right femur, subsequent encounter for fracture with routine healing: Secondary | ICD-10-CM | POA: Diagnosis not present

## 2017-08-09 DIAGNOSIS — I129 Hypertensive chronic kidney disease with stage 1 through stage 4 chronic kidney disease, or unspecified chronic kidney disease: Secondary | ICD-10-CM | POA: Diagnosis not present

## 2017-08-09 DIAGNOSIS — I480 Paroxysmal atrial fibrillation: Secondary | ICD-10-CM | POA: Diagnosis not present

## 2017-08-09 DIAGNOSIS — N189 Chronic kidney disease, unspecified: Secondary | ICD-10-CM | POA: Diagnosis not present

## 2017-08-09 DIAGNOSIS — G2581 Restless legs syndrome: Secondary | ICD-10-CM | POA: Diagnosis not present

## 2017-08-09 DIAGNOSIS — Z96641 Presence of right artificial hip joint: Secondary | ICD-10-CM | POA: Diagnosis not present

## 2017-08-15 DIAGNOSIS — I480 Paroxysmal atrial fibrillation: Secondary | ICD-10-CM | POA: Diagnosis not present

## 2017-08-15 DIAGNOSIS — Z96641 Presence of right artificial hip joint: Secondary | ICD-10-CM | POA: Diagnosis not present

## 2017-08-15 DIAGNOSIS — I129 Hypertensive chronic kidney disease with stage 1 through stage 4 chronic kidney disease, or unspecified chronic kidney disease: Secondary | ICD-10-CM | POA: Diagnosis not present

## 2017-08-15 DIAGNOSIS — G2581 Restless legs syndrome: Secondary | ICD-10-CM | POA: Diagnosis not present

## 2017-08-15 DIAGNOSIS — N189 Chronic kidney disease, unspecified: Secondary | ICD-10-CM | POA: Diagnosis not present

## 2017-08-15 DIAGNOSIS — M80051D Age-related osteoporosis with current pathological fracture, right femur, subsequent encounter for fracture with routine healing: Secondary | ICD-10-CM | POA: Diagnosis not present

## 2017-08-16 DIAGNOSIS — Z471 Aftercare following joint replacement surgery: Secondary | ICD-10-CM | POA: Diagnosis not present

## 2017-08-16 DIAGNOSIS — M25561 Pain in right knee: Secondary | ICD-10-CM | POA: Diagnosis not present

## 2017-08-16 DIAGNOSIS — Z96641 Presence of right artificial hip joint: Secondary | ICD-10-CM | POA: Diagnosis not present

## 2017-08-16 DIAGNOSIS — G8929 Other chronic pain: Secondary | ICD-10-CM | POA: Diagnosis not present

## 2017-08-16 DIAGNOSIS — M1711 Unilateral primary osteoarthritis, right knee: Secondary | ICD-10-CM | POA: Diagnosis not present

## 2017-08-17 DIAGNOSIS — I129 Hypertensive chronic kidney disease with stage 1 through stage 4 chronic kidney disease, or unspecified chronic kidney disease: Secondary | ICD-10-CM | POA: Diagnosis not present

## 2017-08-17 DIAGNOSIS — M80051D Age-related osteoporosis with current pathological fracture, right femur, subsequent encounter for fracture with routine healing: Secondary | ICD-10-CM | POA: Diagnosis not present

## 2017-08-17 DIAGNOSIS — G2581 Restless legs syndrome: Secondary | ICD-10-CM | POA: Diagnosis not present

## 2017-08-17 DIAGNOSIS — I480 Paroxysmal atrial fibrillation: Secondary | ICD-10-CM | POA: Diagnosis not present

## 2017-08-17 DIAGNOSIS — Z96641 Presence of right artificial hip joint: Secondary | ICD-10-CM | POA: Diagnosis not present

## 2017-08-17 DIAGNOSIS — N189 Chronic kidney disease, unspecified: Secondary | ICD-10-CM | POA: Diagnosis not present

## 2017-08-22 DIAGNOSIS — Z96641 Presence of right artificial hip joint: Secondary | ICD-10-CM | POA: Diagnosis not present

## 2017-08-22 DIAGNOSIS — I480 Paroxysmal atrial fibrillation: Secondary | ICD-10-CM | POA: Diagnosis not present

## 2017-08-22 DIAGNOSIS — G2581 Restless legs syndrome: Secondary | ICD-10-CM | POA: Diagnosis not present

## 2017-08-22 DIAGNOSIS — M80051D Age-related osteoporosis with current pathological fracture, right femur, subsequent encounter for fracture with routine healing: Secondary | ICD-10-CM | POA: Diagnosis not present

## 2017-08-22 DIAGNOSIS — I129 Hypertensive chronic kidney disease with stage 1 through stage 4 chronic kidney disease, or unspecified chronic kidney disease: Secondary | ICD-10-CM | POA: Diagnosis not present

## 2017-08-22 DIAGNOSIS — N189 Chronic kidney disease, unspecified: Secondary | ICD-10-CM | POA: Diagnosis not present

## 2017-08-29 DIAGNOSIS — I129 Hypertensive chronic kidney disease with stage 1 through stage 4 chronic kidney disease, or unspecified chronic kidney disease: Secondary | ICD-10-CM | POA: Diagnosis not present

## 2017-08-29 DIAGNOSIS — G2581 Restless legs syndrome: Secondary | ICD-10-CM | POA: Diagnosis not present

## 2017-08-29 DIAGNOSIS — Z96641 Presence of right artificial hip joint: Secondary | ICD-10-CM | POA: Diagnosis not present

## 2017-08-29 DIAGNOSIS — M80051D Age-related osteoporosis with current pathological fracture, right femur, subsequent encounter for fracture with routine healing: Secondary | ICD-10-CM | POA: Diagnosis not present

## 2017-08-29 DIAGNOSIS — I480 Paroxysmal atrial fibrillation: Secondary | ICD-10-CM | POA: Diagnosis not present

## 2017-08-29 DIAGNOSIS — N189 Chronic kidney disease, unspecified: Secondary | ICD-10-CM | POA: Diagnosis not present
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
## 2017-09-25 ENCOUNTER — Encounter: Payer: Self-pay | Admitting: Cardiology

## 2017-09-25 ENCOUNTER — Ambulatory Visit (INDEPENDENT_AMBULATORY_CARE_PROVIDER_SITE_OTHER): Payer: Medicare Other | Admitting: Cardiology

## 2017-09-25 VITALS — BP 126/72 | HR 71 | Ht 66.0 in | Wt 125.0 lb

## 2017-09-25 DIAGNOSIS — E785 Hyperlipidemia, unspecified: Secondary | ICD-10-CM

## 2017-09-25 DIAGNOSIS — Z8673 Personal history of transient ischemic attack (TIA), and cerebral infarction without residual deficits: Secondary | ICD-10-CM

## 2017-09-25 DIAGNOSIS — Z8249 Family history of ischemic heart disease and other diseases of the circulatory system: Secondary | ICD-10-CM | POA: Diagnosis not present

## 2017-09-25 DIAGNOSIS — I481 Persistent atrial fibrillation: Secondary | ICD-10-CM | POA: Diagnosis not present

## 2017-09-25 DIAGNOSIS — I10 Essential (primary) hypertension: Secondary | ICD-10-CM

## 2017-09-25 DIAGNOSIS — I4819 Other persistent atrial fibrillation: Secondary | ICD-10-CM

## 2017-09-25 MED ORDER — FUROSEMIDE 20 MG PO TABS: ORAL_TABLET | ORAL | 6 refills | Status: DC

## 2017-09-25 MED ORDER — APIXABAN 5 MG PO TABS: 5.0000 mg | ORAL_TABLET | Freq: Two times a day (BID) | ORAL | 3 refills | Status: DC

## 2017-09-25 MED ORDER — POTASSIUM CHLORIDE CRYS ER 20 MEQ PO TBCR: 20.0000 meq | EXTENDED_RELEASE_TABLET | Freq: Every day | ORAL | 6 refills | Status: DC

## 2017-09-25 NOTE — Addendum Note (Signed)
Addended by: Kathyrn Lass on: 09/25/2017 04:12 PM   Modules accepted: Orders

## 2017-09-25 NOTE — Addendum Note (Signed)
Addended by: Kathyrn Lass on: 09/25/2017 05:10 PM   Modules accepted: Orders

## 2017-09-25 NOTE — Patient Instructions (Addendum)
Avoid salt intake  We will start you on lasix 10 mg daily for fluid. You will need to take a potassium supplement with this  Continue your current medication  We will check blood work in 2 weeks.   I will see you in 3 months.   Schedule AAA duplex

## 2017-10-11 DIAGNOSIS — Z8673 Personal history of transient ischemic attack (TIA), and cerebral infarction without residual deficits: Secondary | ICD-10-CM | POA: Diagnosis not present

## 2017-10-11 DIAGNOSIS — E785 Hyperlipidemia, unspecified: Secondary | ICD-10-CM | POA: Diagnosis not present

## 2017-10-11 DIAGNOSIS — I481 Persistent atrial fibrillation: Secondary | ICD-10-CM | POA: Diagnosis not present

## 2017-10-11 DIAGNOSIS — I10 Essential (primary) hypertension: Secondary | ICD-10-CM | POA: Diagnosis not present

## 2017-10-12 LAB — BASIC METABOLIC PANEL
BUN/Creatinine Ratio: 20 (ref 12–28)
BUN: 20 mg/dL (ref 8–27)
CO2: 28 mmol/L (ref 20–29)
Calcium: 9.4 mg/dL (ref 8.7–10.3)
Chloride: 97 mmol/L (ref 96–106)
Creatinine, Ser: 1 mg/dL (ref 0.57–1.00)
GFR calc Af Amer: 65 mL/min/{1.73_m2} (ref >59)
GFR calc non Af Amer: 56 mL/min/{1.73_m2} — ABNORMAL LOW (ref >59)
Glucose: 95 mg/dL (ref 65–99)
Potassium: 4.5 mmol/L (ref 3.5–5.2)
Sodium: 139 mmol/L (ref 134–144)

## 2017-10-17 ENCOUNTER — Ambulatory Visit (HOSPITAL_COMMUNITY): Payer: Medicare Other

## 2017-10-19 ENCOUNTER — Encounter: Payer: Self-pay | Admitting: *Deleted

## 2017-10-30 ENCOUNTER — Ambulatory Visit (INDEPENDENT_AMBULATORY_CARE_PROVIDER_SITE_OTHER): Payer: Medicare Other | Admitting: Internal Medicine

## 2017-10-30 ENCOUNTER — Encounter: Payer: Self-pay | Admitting: Internal Medicine

## 2017-10-30 ENCOUNTER — Other Ambulatory Visit (INDEPENDENT_AMBULATORY_CARE_PROVIDER_SITE_OTHER): Payer: Medicare Other

## 2017-10-30 VITALS — BP 136/84 | HR 84 | Ht 66.5 in | Wt 120.4 lb

## 2017-10-30 DIAGNOSIS — R109 Unspecified abdominal pain: Secondary | ICD-10-CM | POA: Diagnosis not present

## 2017-10-30 DIAGNOSIS — R1013 Epigastric pain: Secondary | ICD-10-CM | POA: Diagnosis not present

## 2017-10-30 DIAGNOSIS — Z1211 Encounter for screening for malignant neoplasm of colon: Secondary | ICD-10-CM | POA: Diagnosis not present

## 2017-10-30 DIAGNOSIS — Z8711 Personal history of peptic ulcer disease: Secondary | ICD-10-CM | POA: Diagnosis not present

## 2017-10-30 DIAGNOSIS — R110 Nausea: Secondary | ICD-10-CM

## 2017-10-30 LAB — FERRITIN: Ferritin: 20.8 ng/mL (ref 10.0–291.0)

## 2017-10-30 LAB — IBC PANEL
Iron: 32 ug/dL — ABNORMAL LOW (ref 42–145)
Saturation Ratios: 8.1 % — ABNORMAL LOW (ref 20.0–50.0)
Transferrin: 283 mg/dL (ref 212.0–360.0)

## 2017-10-30 LAB — CBC WITH DIFFERENTIAL/PLATELET
Basophils Absolute: 0.1 10*3/uL (ref 0.0–0.1)
Basophils Relative: 1 % (ref 0.0–3.0)
Eosinophils Absolute: 0.1 10*3/uL (ref 0.0–0.7)
Eosinophils Relative: 1.4 % (ref 0.0–5.0)
HCT: 39.9 % (ref 36.0–46.0)
Hemoglobin: 13.3 g/dL (ref 12.0–15.0)
Lymphocytes Relative: 30.6 % (ref 12.0–46.0)
Lymphs Abs: 1.8 10*3/uL (ref 0.7–4.0)
MCHC: 33.3 g/dL (ref 30.0–36.0)
MCV: 90.6 fl (ref 78.0–100.0)
Monocytes Absolute: 0.5 10*3/uL (ref 0.1–1.0)
Monocytes Relative: 8.6 % (ref 3.0–12.0)
Neutro Abs: 3.4 10*3/uL (ref 1.4–7.7)
Neutrophils Relative %: 58.4 % (ref 43.0–77.0)
Platelets: 230 10*3/uL (ref 150.0–400.0)
RBC: 4.41 Mil/uL (ref 3.87–5.11)
RDW: 12.9 % (ref 11.5–15.5)
WBC: 5.7 10*3/uL (ref 4.0–10.5)

## 2017-10-30 MED ORDER — SUCRALFATE 1 G PO TABS: ORAL_TABLET | ORAL | 1 refills | Status: DC

## 2017-10-30 MED ORDER — OMEPRAZOLE 40 MG PO CPDR: 40.0000 mg | DELAYED_RELEASE_CAPSULE | Freq: Two times a day (BID) | ORAL | 3 refills | Status: DC

## 2017-10-30 NOTE — Patient Instructions (Addendum)
You have been scheduled for a follow up with Dr Hilarie Fredrickson on 01/15/18 at 930 am.  We have sent the following medications to your pharmacy for you to pick up at your convenience: carafate-before meals and at bedtime Omeprazole twice daily  Your physician has requested that you go to the basement for the following lab work before leaving today: CBC, IBC, Ferritin  Your provider has ordered Cologuard testing as an option for colon cancer screening. This is performed by [employer] and may be out of network with your insurance. PRIOR to completing the test, it is YOUR responsibility to contact your insurance about covered benefits for this test. Your out of pocket expense could be anywhere from $0.00 to $649.00.   When you call to check coverage with your insurer, please provide the following information:   -The ONLY provider of Cologuard is [place] code for Cologuard is [phone].  [employer] NPI # [id number]  -[employer] Tax ID # [id number]   We have already sent your demographic and insurance information to [employer] (phone number [phone]) and they should contact you within the next week regarding your test. If you have not heard from them within the next week, please call our office at [phone].   If you are age 28 or older, your body mass index should be between 23-30. Your Body mass index is 19.14 kg/m. If this is out of the aforementioned range listed, please consider follow up with your Primary Care Provider.  If you are age 29 or younger, your body mass index should be between 19-25. Your Body mass index is 19.14 kg/m. If this is out of the aformentioned range listed, please consider follow up with your Primary Care Provider.

## 2017-10-30 NOTE — Progress Notes (Signed)
Subjective:    Patient ID: [name], female    DOB: [birth year], 72 y.o.   MRN: [id number]  HPI [name] is a 72 year old female with a past medical history of gastric adenoma status post endoscopic resection, history of remote peptic ulcer disease, history of gallbladder disease status post cholecystectomy in November 2017, pancreas divisum, history of Candida esophagitis, history of hypertension, hyperlipidemia, hypothyroidism and ocular stroke who is here for follow-up.  She is here today with her husband.  It has been over 2 years since I saw [name] since this time she went for cholecystectomy with Dr. Donne Hazel.  This was performed in November 2017 and showed cholecystitis on pathology.  This was performed for ongoing abdominal pain, nausea, vomiting and weight loss.  She reports that the cholecystectomy definitively helped her abdominal pain, nausea and vomiting.  It took her nearly 6 months to recover from the surgery but after that time her appetite has improved and she has been able to gain a little bit of weight.  She still reports being "scared to eat" because she worries that the symptoms she had in the distant past might come back.  She tries to eat a bland diet.  She avoids red meat because it seems to make her sick.  Previously she was also having issues with diarrhea but this is been much less in the last 6-7 months.  She will still have loose stool with any greasy food.  In the past week she has had some mild constipation.  New for her in the last month or 2 is some epigastric abdominal aching discomfort.  This is associated with very mild nausea but no vomiting.  This comes and goes and seems to be worse with eating.  She states it hurts "in my bread basket".  Reminiscent of remote ulcer disease.  This is despite Prilosec 40 mg twice daily which she takes on a regular basis.  The last year has been somewhat difficult for her and she has had 3 falls.  The last in  November when she broke her right hip and went for total hip arthroplasty with Dr. Alvan Dame.  She also lost her mother and brother in the past year.  It was found that she has atrial fibrillation and she is now on diltiazem and Eliquis therapy.  She denies dysphagia and odynophagia.  No blood in her stool or melena.  Of note I did perform an MRI abdomen with MRCP in September 2017 to evaluate her symptoms at that time.  This showed multiple stable hepatic cysts, intra-and extrahepatic biliary ductal dilatation which was also stable without cholelithiasis or choledocholithiasis.  There was also mild diffuse distention of the main pancreatic duct without change and pancreas divisum anatomy.  Her last colonoscopy was without polyps in 2007.  Dr. Earlean Shawl performed this exam  Review of Systems As per HPI, otherwise negative  Current Medications, Allergies, Past Medical History, Past Surgical History, Family History and Social History were reviewed in [facility] record.     Objective:   Physical Exam BP 136/84   Pulse 84 Comment: irregular  Ht 5' 6.5" (1.689 m)   Wt 120 lb 6.4 oz (54.6 kg)   BMI 19.14 kg/m  Constitutional: Well-developed and thin though well-nourished. No distress. HEENT: Normocephalic and atraumatic. Oropharynx is clear and moist. Conjunctivae are normal.  No scleral icterus. Neck: Neck supple. Trachea midline. Cardiovascular: Normal rate, regular rhythm and intact distal pulses.  Pulmonary/chest: Effort normal  and breath sounds normal. No wheezing, rales or rhonchi. Abdominal: Soft, mild epigastric tenderness without rebound or guarding, nondistended. Bowel sounds active throughout.  Extremities: no clubbing, cyanosis, or edema Neurological: Alert and oriented to person place and time. Skin: Skin is warm and dry. Psychiatric: Normal mood and affect. Behavior is normal.  CBC    Component Value Date/Time   WBC 4.9 07/17/2017 1506   WBC 10.2  06/28/2017 0337   RBC 3.18 (L) 07/17/2017 1506   RBC 3.93 06/28/2017 0337   HGB 9.6 (L) 07/17/2017 1506   HCT 29.9 (L) 07/17/2017 1506   PLT 414 (H) 07/17/2017 1506   MCV 94 07/17/2017 1506   MCH 30.2 07/17/2017 1506   MCH 30.0 06/28/2017 0337   MCHC 32.1 07/17/2017 1506   MCHC 32.8 06/28/2017 0337   RDW 14.5 07/17/2017 1506   LYMPHSABS 2.0 06/27/2016 1128   MONOABS 0.4 06/27/2016 1128   EOSABS 0.1 06/27/2016 1128   BASOSABS 0.0 06/27/2016 1128   CMP     Component Value Date/Time   NA 139 10/11/2017 1547   K 4.5 10/11/2017 1547   CL 97 10/11/2017 1547   CO2 28 10/11/2017 1547   GLUCOSE 95 10/11/2017 1547   GLUCOSE 145 (H) 06/28/2017 0337   BUN 20 10/11/2017 1547   CREATININE 1.00 10/11/2017 1547   CALCIUM 9.4 10/11/2017 1547   PROT 6.0 (L) 07/01/2016 0716   ALBUMIN 3.3 (L) 07/01/2016 0716   AST 28 07/01/2016 0716   ALT 19 07/01/2016 0716   ALKPHOS 126 07/01/2016 0716   BILITOT 2.3 (H) 07/01/2016 0716   GFRNONAA 56 (L) 10/11/2017 1547   GFRAA 65 10/11/2017 1547        Assessment & Plan:  72 year old female with a past medical history of gastric adenoma status post endoscopic resection, history of remote peptic ulcer disease, history of gallbladder disease status post cholecystectomy in November 2017, pancreas divisum, history of Candida esophagitis, history of hypertension, hyperlipidemia, hypothyroidism and ocular stroke who is here for follow-up.  1.  Epigastric pain --history of gastric adenoma though I performed an upper endoscopy in 2017 which did not show any evidence of gastric neoplasia.  There was erythema and intestinalization in the prepyloric stomach which was biopsied extensively.  Prepyloric and pyloric biopsies showed reactive gastropathy changes and intestinal metaplasia.  Scattered chronic inflammation negative for H. pylori.  No dysplasia.  Gastric body biopsies were benign with mild chronic inactive gastritis also negative for H. pylori.  No metaplasia or  dysplasia. --She has been on twice daily omeprazole and so is on maximal acid suppression therapy.  We will continue omeprazole 40 mg twice daily AC --Begin Carafate 1 g 3 times daily before meals and at bedtime. --Follow-up in 2 months and if epigastric pain continues consider repeat endoscopy  2.  Anemia --last blood count was just after hip fracture so her anemia was likely postsurgical.  Repeat CBC and iron studies today  3.  CRC screening --it has been 10+ years since her last screening colonoscopy.  I have recommended Cologuard given her anticoagulation.  If this is positive she understands she would need a colonoscopy.  I will see her in May, sooner if needed 25 minutes spent with the patient today. Greater than 50% was spent in counseling and coordination of care with the patient

## 2017-11-14 DIAGNOSIS — Z1211 Encounter for screening for malignant neoplasm of colon: Secondary | ICD-10-CM | POA: Diagnosis not present

## 2017-11-14 DIAGNOSIS — Z1212 Encounter for screening for malignant neoplasm of rectum: Secondary | ICD-10-CM | POA: Diagnosis not present

## 2017-11-23 ENCOUNTER — Other Ambulatory Visit: Payer: Self-pay

## 2017-11-23 LAB — COLOGUARD: Cologuard: NEGATIVE

## 2017-12-12 ENCOUNTER — Encounter: Payer: Self-pay | Admitting: Cardiology
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
## 2017-12-25 ENCOUNTER — Ambulatory Visit: Payer: Medicare Other | Admitting: Cardiology

## 2017-12-26 ENCOUNTER — Encounter: Payer: Self-pay | Admitting: *Deleted

## 2018-01-15 ENCOUNTER — Ambulatory Visit: Payer: Medicare Other | Admitting: Internal Medicine

## 2018-01-18 ENCOUNTER — Telehealth: Payer: Self-pay | Admitting: Cardiology

## 2018-01-18 NOTE — Telephone Encounter (Signed)
Patient calling the office for samples of medication:   1.  What medication and dosage are you requesting samples for? Eliquis don't know mg 2.  Are you currently out of this medication? Yes

## 2018-01-18 NOTE — Telephone Encounter (Signed)
Samples are at the front desk for pt to p/u  Left detailed message for pt to come and p/u

## 2018-01-31 ENCOUNTER — Other Ambulatory Visit: Payer: Self-pay | Admitting: Internal Medicine

## 2018-02-11 ENCOUNTER — Ambulatory Visit (INDEPENDENT_AMBULATORY_CARE_PROVIDER_SITE_OTHER): Payer: Medicare Other | Admitting: Physician Assistant

## 2018-02-11 ENCOUNTER — Encounter

## 2018-02-11 ENCOUNTER — Encounter: Payer: Self-pay | Admitting: Physician Assistant

## 2018-02-11 VITALS — BP 110/66 | HR 67 | Ht 66.5 in | Wt 117.0 lb

## 2018-02-11 DIAGNOSIS — Z01810 Encounter for preprocedural cardiovascular examination: Secondary | ICD-10-CM

## 2018-02-11 DIAGNOSIS — I4819 Other persistent atrial fibrillation: Secondary | ICD-10-CM

## 2018-02-11 DIAGNOSIS — I481 Persistent atrial fibrillation: Secondary | ICD-10-CM | POA: Diagnosis not present

## 2018-02-11 DIAGNOSIS — E785 Hyperlipidemia, unspecified: Secondary | ICD-10-CM | POA: Diagnosis not present

## 2018-02-11 DIAGNOSIS — R600 Localized edema: Secondary | ICD-10-CM

## 2018-02-11 DIAGNOSIS — I10 Essential (primary) hypertension: Secondary | ICD-10-CM

## 2018-02-11 DIAGNOSIS — I639 Cerebral infarction, unspecified: Secondary | ICD-10-CM

## 2018-02-11 NOTE — Patient Instructions (Addendum)
Medication Instructions:   Your physician recommends that you continue on your current medications as directed. Please refer to the Current Medication list given to you today.   If you need a refill on your cardiac medications before your next appointment, please call your pharmacy.  Labwork: NONE ORDERED  TODAY    Testing/Procedures:  PT NEEDS TO BE SCHEDULE FOR MRI AT [facility].   Follow-Up: WITH DR Martinique IN 3 MONTHS    Any Other Special Instructions Will Be Listed Below (If Applied)  Limit sodium intake to 500 mg per week   Make sure you weight yourself daily contact office if weight gain 3lbs in a day 5lbs in a week  Drunk at least 1.5 liter of all fluids with a day
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
## 2018-02-14 ENCOUNTER — Telehealth: Payer: Self-pay | Admitting: *Deleted

## 2018-02-14 NOTE — Telephone Encounter (Signed)
Rhonda PA. States she talked with patient on 02/11/18 at office visit. patient states she has not heard back from patient assistance -Eliquis.  Not sure when form was sent to [facility] ? March-may  2019.  Spoke  REPRESENTATIVE - DAWN - she states that there is no record of patient assistance.  Representative obtain  Information for reference #.  So once patient assistance form is faxed  It can be processed.   reference  # [id number] PLACE THIS ON TOP OF PAPER WORK .  PATIENT CONTACTED TO  COME TO OFFICE AAN FILL FORM AND BRING PACK FOR FAXING.  PATIENT STATES SHE WILL PICK UP FORM TOMORROW.

## 2018-02-15 ENCOUNTER — Telehealth: Payer: Self-pay | Admitting: Cardiology

## 2018-02-15 ENCOUNTER — Ambulatory Visit (HOSPITAL_COMMUNITY): Admission: RE | Admit: 2018-02-15 | Payer: Medicare Other | Source: Ambulatory Visit

## 2018-02-15 NOTE — Telephone Encounter (Signed)
New Message:       Pt states she is needing to fill out some patient assistance papers for her Eliquis.

## 2018-02-15 NOTE — Telephone Encounter (Signed)
Spoke to patient-patient states husband is on the way to pick up assistance forms.   Advised these were left at front desk yesterday.   Advised to fill out patient portion and obtain needed documents.  Once completed bring back by the office for provider portion to be completed and faxed.   Patient aware and verbalized understanding.

## 2018-02-19 DIAGNOSIS — R82998 Other abnormal findings in urine: Secondary | ICD-10-CM | POA: Diagnosis not present

## 2018-02-20 DIAGNOSIS — M859 Disorder of bone density and structure, unspecified: Secondary | ICD-10-CM | POA: Diagnosis not present

## 2018-02-20 DIAGNOSIS — E038 Other specified hypothyroidism: Secondary | ICD-10-CM | POA: Diagnosis not present

## 2018-02-20 DIAGNOSIS — E7849 Other hyperlipidemia: Secondary | ICD-10-CM | POA: Diagnosis not present

## 2018-02-20 DIAGNOSIS — I10 Essential (primary) hypertension: Secondary | ICD-10-CM | POA: Diagnosis not present

## 2018-02-26 ENCOUNTER — Other Ambulatory Visit: Payer: Self-pay | Admitting: Internal Medicine

## 2018-02-26 ENCOUNTER — Ambulatory Visit (HOSPITAL_COMMUNITY): Payer: Medicare Other

## 2018-02-26 DIAGNOSIS — Z1231 Encounter for screening mammogram for malignant neoplasm of breast: Secondary | ICD-10-CM

## 2018-03-08 ENCOUNTER — Ambulatory Visit (HOSPITAL_COMMUNITY)
Admission: RE | Admit: 2018-03-08 | Payer: Medicare Other | Source: Ambulatory Visit | Attending: Cardiology | Admitting: Cardiology

## 2018-03-12 DIAGNOSIS — H4720 Unspecified optic atrophy: Secondary | ICD-10-CM | POA: Diagnosis not present

## 2018-03-12 DIAGNOSIS — H52203 Unspecified astigmatism, bilateral: Secondary | ICD-10-CM | POA: Diagnosis not present

## 2018-03-12 DIAGNOSIS — H04123 Dry eye syndrome of bilateral lacrimal glands: Secondary | ICD-10-CM | POA: Diagnosis not present

## 2018-03-12 DIAGNOSIS — H0100A Unspecified blepharitis right eye, upper and lower eyelids: Secondary | ICD-10-CM | POA: Diagnosis not present

## 2018-03-13 ENCOUNTER — Ambulatory Visit (HOSPITAL_COMMUNITY)
Admission: RE | Admit: 2018-03-13 | Payer: Medicare Other | Source: Ambulatory Visit | Attending: Cardiology | Admitting: Cardiology

## 2018-03-13 ENCOUNTER — Other Ambulatory Visit: Payer: Self-pay | Admitting: Cardiology

## 2018-03-13 DIAGNOSIS — Z136 Encounter for screening for cardiovascular disorders: Secondary | ICD-10-CM

## 2018-03-13 DIAGNOSIS — Z8249 Family history of ischemic heart disease and other diseases of the circulatory system: Secondary | ICD-10-CM

## 2018-03-18 ENCOUNTER — Ambulatory Visit: Payer: Medicare Other | Admitting: Internal Medicine

## 2018-03-19 ENCOUNTER — Ambulatory Visit
Admission: RE | Admit: 2018-03-19 | Discharge: 2018-03-19 | Disposition: A | Discharge status: Home / Self Care | Payer: Medicare Other | Source: Ambulatory Visit | Attending: Internal Medicine | Admitting: Internal Medicine

## 2018-03-19 DIAGNOSIS — Z1231 Encounter for screening mammogram for malignant neoplasm of breast: Secondary | ICD-10-CM | POA: Diagnosis not present

## 2018-03-25 ENCOUNTER — Ambulatory Visit (HOSPITAL_COMMUNITY)
Admission: RE | Admit: 2018-03-25 | Discharge: 2018-03-25 | Disposition: A | Discharge status: Home / Self Care | Payer: Medicare Other | Source: Ambulatory Visit | Attending: Cardiovascular Disease | Admitting: Cardiovascular Disease

## 2018-03-25 ENCOUNTER — Other Ambulatory Visit: Payer: Self-pay | Admitting: Cardiology

## 2018-03-25 DIAGNOSIS — I700 Atherosclerosis of aorta: Secondary | ICD-10-CM | POA: Insufficient documentation

## 2018-03-25 DIAGNOSIS — Z8249 Family history of ischemic heart disease and other diseases of the circulatory system: Secondary | ICD-10-CM

## 2018-04-06 ENCOUNTER — Other Ambulatory Visit: Payer: Self-pay | Admitting: Cardiology
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
## 2018-05-17 DIAGNOSIS — K148 Other diseases of tongue: Secondary | ICD-10-CM | POA: Diagnosis not present

## 2018-05-20 ENCOUNTER — Encounter: Payer: Self-pay | Admitting: Cardiology

## 2018-05-20 ENCOUNTER — Ambulatory Visit (INDEPENDENT_AMBULATORY_CARE_PROVIDER_SITE_OTHER): Payer: Medicare Other | Admitting: Cardiology

## 2018-05-20 VITALS — BP 128/78 | HR 56 | Ht 66.5 in | Wt 119.0 lb

## 2018-05-20 DIAGNOSIS — I10 Essential (primary) hypertension: Secondary | ICD-10-CM

## 2018-05-20 DIAGNOSIS — I481 Persistent atrial fibrillation: Secondary | ICD-10-CM

## 2018-05-20 DIAGNOSIS — I639 Cerebral infarction, unspecified: Secondary | ICD-10-CM

## 2018-05-20 DIAGNOSIS — I4819 Other persistent atrial fibrillation: Secondary | ICD-10-CM

## 2018-05-20 NOTE — Patient Instructions (Addendum)
You are clear to have your dental work. Stop Eliquis 2 days prior then resume afterwards  Make sure you are not taking Accupril/ Quinapril  I will see you in 6 months

## 2018-05-31 ENCOUNTER — Telehealth: Payer: Self-pay

## 2018-05-31 NOTE — Telephone Encounter (Signed)
Primary Cardiologist: [facility] Pre-operative Risk Assessment    Request for surgical clearance:  1. What type of surgery is being performed? Tooth extractions   2. When is this surgery scheduled? TBD   3. What type of clearance is required (medical clearance vs. Pharmacy clearance to hold med vs. Both)? Both  4. Are there any medications that need to be held prior to surgery and how long? Not listed   5. Practice name and name of physician performing surgery?  [facility] (Dr.Bertrand Bonnick)  6. What is your office phone number [phone]    7.   What is your office fax number [phone]  8.   Anesthesia type (None, local, MAC, general) ? IV conscious sedation (Midazolam, Diazepam, Fentanyl)   Lamar Laundry 05/31/2018, 2:31 PM  _________________________________________________________________   (provider comments below)

## 2018-05-31 NOTE — Telephone Encounter (Signed)
Pt saw Dr. Martinique 05/20/18 "Dental surgery- she is cleared for this. Can hold Eliquis for 48 hours only.:

## 2018-06-04 DIAGNOSIS — K148 Other diseases of tongue: Secondary | ICD-10-CM | POA: Diagnosis not present

## 2018-06-12 DIAGNOSIS — Z96641 Presence of right artificial hip joint: Secondary | ICD-10-CM | POA: Diagnosis not present

## 2018-06-12 DIAGNOSIS — M1611 Unilateral primary osteoarthritis, right hip: Secondary | ICD-10-CM | POA: Diagnosis not present

## 2018-06-12 DIAGNOSIS — M25552 Pain in left hip: Secondary | ICD-10-CM | POA: Diagnosis not present

## 2018-06-12 DIAGNOSIS — S7002XA Contusion of left hip, initial encounter: Secondary | ICD-10-CM | POA: Diagnosis not present

## 2018-06-21 DIAGNOSIS — Z79891 Long term (current) use of opiate analgesic: Secondary | ICD-10-CM | POA: Diagnosis not present

## 2018-06-21 DIAGNOSIS — Z7901 Long term (current) use of anticoagulants: Secondary | ICD-10-CM | POA: Diagnosis not present

## 2018-06-21 DIAGNOSIS — Z9181 History of falling: Secondary | ICD-10-CM | POA: Diagnosis not present

## 2018-06-21 DIAGNOSIS — I10 Essential (primary) hypertension: Secondary | ICD-10-CM | POA: Diagnosis not present

## 2018-06-21 DIAGNOSIS — F419 Anxiety disorder, unspecified: Secondary | ICD-10-CM | POA: Diagnosis not present

## 2018-06-21 DIAGNOSIS — M1711 Unilateral primary osteoarthritis, right knee: Secondary | ICD-10-CM | POA: Diagnosis not present

## 2018-06-21 DIAGNOSIS — S7002XD Contusion of left hip, subsequent encounter: Secondary | ICD-10-CM | POA: Diagnosis not present

## 2018-06-21 DIAGNOSIS — M21061 Valgus deformity, not elsewhere classified, right knee: Secondary | ICD-10-CM | POA: Diagnosis not present

## 2018-06-25 DIAGNOSIS — Z7901 Long term (current) use of anticoagulants: Secondary | ICD-10-CM | POA: Diagnosis not present

## 2018-06-25 DIAGNOSIS — M1711 Unilateral primary osteoarthritis, right knee: Secondary | ICD-10-CM | POA: Diagnosis not present

## 2018-06-25 DIAGNOSIS — I10 Essential (primary) hypertension: Secondary | ICD-10-CM | POA: Diagnosis not present

## 2018-06-25 DIAGNOSIS — F419 Anxiety disorder, unspecified: Secondary | ICD-10-CM | POA: Diagnosis not present

## 2018-06-25 DIAGNOSIS — S7002XD Contusion of left hip, subsequent encounter: Secondary | ICD-10-CM | POA: Diagnosis not present

## 2018-06-25 DIAGNOSIS — M21061 Valgus deformity, not elsewhere classified, right knee: Secondary | ICD-10-CM | POA: Diagnosis not present

## 2018-06-27 DIAGNOSIS — Z7901 Long term (current) use of anticoagulants: Secondary | ICD-10-CM | POA: Diagnosis not present

## 2018-06-27 DIAGNOSIS — M1711 Unilateral primary osteoarthritis, right knee: Secondary | ICD-10-CM | POA: Diagnosis not present

## 2018-06-27 DIAGNOSIS — M21061 Valgus deformity, not elsewhere classified, right knee: Secondary | ICD-10-CM | POA: Diagnosis not present

## 2018-06-27 DIAGNOSIS — S7002XD Contusion of left hip, subsequent encounter: Secondary | ICD-10-CM | POA: Diagnosis not present

## 2018-06-27 DIAGNOSIS — F419 Anxiety disorder, unspecified: Secondary | ICD-10-CM | POA: Diagnosis not present

## 2018-06-27 DIAGNOSIS — I10 Essential (primary) hypertension: Secondary | ICD-10-CM | POA: Diagnosis not present

## 2018-07-02 DIAGNOSIS — M21061 Valgus deformity, not elsewhere classified, right knee: Secondary | ICD-10-CM | POA: Diagnosis not present

## 2018-07-02 DIAGNOSIS — K148 Other diseases of tongue: Secondary | ICD-10-CM | POA: Diagnosis not present

## 2018-07-02 DIAGNOSIS — Z87891 Personal history of nicotine dependence: Secondary | ICD-10-CM | POA: Diagnosis not present

## 2018-07-02 DIAGNOSIS — S7002XD Contusion of left hip, subsequent encounter: Secondary | ICD-10-CM | POA: Diagnosis not present

## 2018-07-02 DIAGNOSIS — F419 Anxiety disorder, unspecified: Secondary | ICD-10-CM | POA: Diagnosis not present

## 2018-07-02 DIAGNOSIS — I10 Essential (primary) hypertension: Secondary | ICD-10-CM | POA: Diagnosis not present

## 2018-07-02 DIAGNOSIS — Z7901 Long term (current) use of anticoagulants: Secondary | ICD-10-CM | POA: Diagnosis not present

## 2018-07-02 DIAGNOSIS — M1711 Unilateral primary osteoarthritis, right knee: Secondary | ICD-10-CM | POA: Diagnosis not present

## 2018-07-03 ENCOUNTER — Other Ambulatory Visit: Payer: Self-pay

## 2018-07-03 MED ORDER — IRBESARTAN 300 MG PO TABS: 300.0000 mg | ORAL_TABLET | Freq: Every day | ORAL | 1 refills | Status: DC

## 2018-07-04 DIAGNOSIS — S7002XD Contusion of left hip, subsequent encounter: Secondary | ICD-10-CM | POA: Diagnosis not present

## 2018-07-04 DIAGNOSIS — M21061 Valgus deformity, not elsewhere classified, right knee: Secondary | ICD-10-CM | POA: Diagnosis not present

## 2018-07-04 DIAGNOSIS — M1711 Unilateral primary osteoarthritis, right knee: Secondary | ICD-10-CM | POA: Diagnosis not present

## 2018-07-04 DIAGNOSIS — Z7901 Long term (current) use of anticoagulants: Secondary | ICD-10-CM | POA: Diagnosis not present

## 2018-07-04 DIAGNOSIS — F419 Anxiety disorder, unspecified: Secondary | ICD-10-CM | POA: Diagnosis not present

## 2018-07-04 DIAGNOSIS — I10 Essential (primary) hypertension: Secondary | ICD-10-CM | POA: Diagnosis not present

## 2018-07-11 DIAGNOSIS — S7002XD Contusion of left hip, subsequent encounter: Secondary | ICD-10-CM | POA: Diagnosis not present

## 2018-07-11 DIAGNOSIS — Z7901 Long term (current) use of anticoagulants: Secondary | ICD-10-CM | POA: Diagnosis not present

## 2018-07-11 DIAGNOSIS — F419 Anxiety disorder, unspecified: Secondary | ICD-10-CM | POA: Diagnosis not present

## 2018-07-11 DIAGNOSIS — I10 Essential (primary) hypertension: Secondary | ICD-10-CM | POA: Diagnosis not present

## 2018-07-11 DIAGNOSIS — M21061 Valgus deformity, not elsewhere classified, right knee: Secondary | ICD-10-CM | POA: Diagnosis not present

## 2018-07-11 DIAGNOSIS — M1711 Unilateral primary osteoarthritis, right knee: Secondary | ICD-10-CM | POA: Diagnosis not present

## 2018-08-02 ENCOUNTER — Telehealth: Payer: Self-pay | Admitting: Cardiology

## 2018-08-02 NOTE — Telephone Encounter (Signed)
New message ° ° °Patient is returning your call. °

## 2018-08-02 NOTE — Telephone Encounter (Signed)
Returned call to pt she states that she has swelling in her LE and a "little bit" in her face. She says that she did not "fall" she caught herself and 'smashed her ribs" and they are hurting ans she was wondering if we could give her an order for chest xray to see if they are broken. Informed pt that this is not cardiac related and to call PCP for this order.  Informed pt to take extra lasix 20mg  x2 days and take daily weight to see if wt/swelling goes down. Verbalizes understanding and will call back over the weekend should any sx persist/worsen, she will CB Monday if this does not help

## 2018-08-02 NOTE — Telephone Encounter (Signed)
Pt c/o swelling: STAT is pt has developed SOB within 24 hours  1) How much weight have you gained and in what time span?   2) If swelling, where is the swelling located? Face, Legs and Feet  3) Are you currently taking a fluid pill? Yes   4) Are you currently SOB? Not much     5) Do you have a log of your daily weights (if so, list)? 118  6) Have you gained 3 pounds in a day or 5 pounds in a week? no  7) Have you traveled recently? No  Pt wants to be seen today pt stated her leg gave out last.

## 2018-08-02 NOTE — Telephone Encounter (Signed)
[id number] 

## 2018-08-07 DIAGNOSIS — R0781 Pleurodynia: Secondary | ICD-10-CM | POA: Diagnosis not present

## 2018-08-07 DIAGNOSIS — Z681 Body mass index (BMI) 19 or less, adult: Secondary | ICD-10-CM | POA: Diagnosis not present

## 2018-08-20 ENCOUNTER — Telehealth: Payer: Self-pay
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
## 2018-08-20 NOTE — Telephone Encounter (Signed)
Spoke to patient I received a message you need Eliquis samples.Office currently out of samples.Stated she made appointment with Dr.Jordan 09/04/18 but would like to see him sooner.She is having a lot of swelling in face.Appointment scheduled with Dr.Jordan 08/22/18 at 10:40 am.

## 2018-08-22 ENCOUNTER — Ambulatory Visit: Payer: Medicare Other | Admitting: Cardiology

## 2018-08-22 ENCOUNTER — Telehealth: Payer: Self-pay | Admitting: Cardiology

## 2018-08-22 NOTE — Telephone Encounter (Signed)
Returned call to patient she stated she had to cancel appointment today with Dr.Jordan.She rescheduled to 09/04/18.She requested Eliquis samples.Office out of samples at present.

## 2018-08-22 NOTE — Telephone Encounter (Signed)
New Message:      Pt is not coming today, because she is sick. She would like for you to call her when you have time today please.

## 2018-08-26 DIAGNOSIS — I10 Essential (primary) hypertension: Secondary | ICD-10-CM | POA: Diagnosis not present

## 2018-08-27 NOTE — Telephone Encounter (Signed)
Spoke to patient Eliquis 5 mg samples left at [facility] office front desk. 
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
## 2018-09-04 ENCOUNTER — Encounter: Payer: Self-pay | Admitting: Cardiology

## 2018-09-04 ENCOUNTER — Ambulatory Visit (INDEPENDENT_AMBULATORY_CARE_PROVIDER_SITE_OTHER): Payer: Medicare Other | Admitting: Cardiology

## 2018-09-04 VITALS — BP 120/66 | HR 50 | Ht 65.0 in | Wt 115.8 lb

## 2018-09-04 DIAGNOSIS — I700 Atherosclerosis of aorta: Secondary | ICD-10-CM

## 2018-09-04 DIAGNOSIS — R600 Localized edema: Secondary | ICD-10-CM | POA: Diagnosis not present

## 2018-09-04 DIAGNOSIS — I482 Chronic atrial fibrillation, unspecified: Secondary | ICD-10-CM | POA: Diagnosis not present

## 2018-09-04 DIAGNOSIS — Z8673 Personal history of transient ischemic attack (TIA), and cerebral infarction without residual deficits: Secondary | ICD-10-CM | POA: Diagnosis not present

## 2018-09-04 DIAGNOSIS — I10 Essential (primary) hypertension: Secondary | ICD-10-CM

## 2018-09-04 MED ORDER — DILTIAZEM HCL ER COATED BEADS 180 MG PO CP24: 180.0000 mg | ORAL_CAPSULE | Freq: Every day | ORAL | 3 refills | Status: DC

## 2018-09-04 MED ORDER — FUROSEMIDE 20 MG PO TABS
20.0000 mg | ORAL_TABLET | Freq: Every day | ORAL | 3 refills | Status: DC
Start: 2018-09-04 — End: 2019-02-13

## 2018-09-04 NOTE — Patient Instructions (Signed)
Take lasix 20 mg daily  Decrease diltiazem to 180 mg daily  Monitor your heart rate. Let me know if it is staying < 60.

## 2018-09-19 ENCOUNTER — Other Ambulatory Visit: Payer: Self-pay | Admitting: Cardiology

## 2018-09-19 NOTE — Telephone Encounter (Signed)
Rx request sent to pharmacy.  

## 2018-09-30 DIAGNOSIS — K148 Other diseases of tongue: Secondary | ICD-10-CM | POA: Diagnosis not present

## 2018-10-01 ENCOUNTER — Telehealth: Payer: Self-pay | Admitting: Cardiology

## 2018-10-01 NOTE — Telephone Encounter (Signed)
LMTCB

## 2018-10-01 NOTE — Telephone Encounter (Signed)
Pt c/o BP issue: STAT if pt c/o blurred vision, one-sided weakness or slurred speech  1. What are your last 5 BP readings? 128/77 73        175/83  69       162/108 98       121/81   81       140/65  64 2. Are you having any other symptoms (ex. Dizziness, headache, blurred vision, passed out)? No symptoms  3. What is your BP issue? Patient thinks medications may need to be changed and was also wants to know about her surgery.    STAT if HR is under 50 or over 120 (normal HR is 60-100 beats per minute)  1) What is your heart rate? 73           2) Do you have a log of your heart rate readings (document readings)? 73              69           98           81           64  3) Do you have any other symptoms?no, she was told to contact office if it goes over 60

## 2018-10-02 NOTE — Telephone Encounter (Signed)
[id number] 

## 2018-10-03 MED ORDER — DILTIAZEM HCL ER COATED BEADS 240 MG PO CP24: 240.0000 mg | ORAL_CAPSULE | Freq: Every day | ORAL | 6 refills | Status: DC

## 2018-10-03 NOTE — Telephone Encounter (Signed)
Spoke to patient she stated her B/P has been elevated.Stated this past Sunday B/P 160/105 pulse 100.Stated she feels tired,no energy.Stated yesterday B/P 120/83 pulse 85.This morning B/P 141/80 pulse 88.Stated she was told not to let pulse get over 60.She use to take Diltiazem 240 mg and she takes 180 mg now.She wanted to ask Dr.Jordan if she needs to start taking 240 mg again.She takes B/P meds at night she wanted to know if she needs to take in morning.Advised I will speak to Dr.Jordan and call her back with his recommendations.

## 2018-10-03 NOTE — Telephone Encounter (Signed)
Returned call to patient Dr.Jordan advised ok to increase Diltiazem to 240 mg daily.Advised to continue to monitor B/P and pulse.Advised to call back if B/P continues to be elevated.

## 2018-10-03 NOTE — Telephone Encounter (Signed)
Returned call to pt. She states she was calling to speak with nurse Malachy Mood. Asked pt if there's anything that I could assist her with. Pt denies and requested to only speak with Malachy Mood. Will route message.

## 2018-10-04 DIAGNOSIS — M1711 Unilateral primary osteoarthritis, right knee: Secondary | ICD-10-CM | POA: Diagnosis not present

## 2018-10-04 DIAGNOSIS — M25561 Pain in right knee: Secondary | ICD-10-CM | POA: Diagnosis not present

## 2018-10-08 ENCOUNTER — Telehealth: Payer: Self-pay | Admitting: *Deleted

## 2018-10-08 NOTE — Telephone Encounter (Signed)
   [facility] Pre-operative Risk Assessment    Request for surgical clearance:  1. What type of surgery is being performed? RIGHT TOTAL KNEE   2. When is this surgery scheduled? 11/26/18   3. What type of clearance is required (medical clearance vs. Pharmacy clearance to hold med vs. Both)? BOTH  4. Are there any medications that need to be held prior to surgery and how long?ELIQUIS HOLD X 3 DAYS PRIOR  5. Practice name and name of physician performing surgery? [facility]; DR.OLIN   6. What is your office phone number [phone]    7.   What is your office fax number [phone]  8.   Anesthesia type (None, local, MAC, general) ? SPINAL   [name] 10/08/2018, 11:45 AM  _________________________________________________________________   (provider comments below)

## 2018-10-08 NOTE — Telephone Encounter (Signed)
Agree  Makinzy Cleere MD, FACC   

## 2018-10-08 NOTE — Telephone Encounter (Signed)
Pt takes Eliquis for afib with CHADS2VASc score of 5 (age, sex, HTN, occular CVA in 2014). She did not start Eliquis until afib was diagnosed in 2018. Renal function was normal when checked 1 year ago. Recommend holding Eliquis for 3 dayis prior to TKA since spinal sedation is typically used. Will confirm with MD that 3 day hold is ok with CVA history since this was an occular stroke that occurred 4 years before anticoagulation was started for afib.

## 2018-10-08 NOTE — Telephone Encounter (Signed)
   Primary Cardiologist: Peter Martinique, MD  Chart reviewed as part of pre-operative protocol coverage.   Patient was cleared by Dr. Martinique 09/04/18 when seen in clinic.   "Osteoarthritis of the knee. Plans for TKR. I think she is a suitable candidate from a cardiac standpoint".  I will route this recommendation to the requesting party via Epic fax function and remove from pre-op pool.  Please call with questions.  [place], [place] 10/08/2018, 1:34 PM

## 2018-10-08 NOTE — Telephone Encounter (Signed)
Ok to hold Eliquis for 3 days prior to procedure.

## 2018-10-22 DIAGNOSIS — M1711 Unilateral primary osteoarthritis, right knee: Secondary | ICD-10-CM | POA: Diagnosis not present

## 2018-12-10 ENCOUNTER — Inpatient Hospital Stay: Admit: 2018-12-10 | Payer: Medicare Other | Admitting: Orthopedic Surgery

## 2018-12-10 DIAGNOSIS — J302 Other seasonal allergic rhinitis: Secondary | ICD-10-CM | POA: Diagnosis not present

## 2018-12-10 DIAGNOSIS — B3781 Candidal esophagitis: Secondary | ICD-10-CM | POA: Diagnosis not present

## 2018-12-10 DIAGNOSIS — R220 Localized swelling, mass and lump, head: Secondary | ICD-10-CM | POA: Diagnosis not present

## 2018-12-10 SURGERY — ARTHROPLASTY, KNEE, TOTAL
Anesthesia: Spinal | Laterality: Right

## 2018-12-27 DIAGNOSIS — R197 Diarrhea, unspecified: Secondary | ICD-10-CM | POA: Diagnosis not present

## 2018-12-27 DIAGNOSIS — I872 Venous insufficiency (chronic) (peripheral): Secondary | ICD-10-CM | POA: Diagnosis not present

## 2018-12-30 ENCOUNTER — Other Ambulatory Visit: Payer: Self-pay

## 2018-12-30 ENCOUNTER — Ambulatory Visit (HOSPITAL_COMMUNITY)
Admission: RE | Admit: 2018-12-30 | Discharge: 2018-12-30 | Disposition: A | Discharge status: Home / Self Care | Payer: Medicare Other | Source: Ambulatory Visit | Attending: Physician Assistant | Admitting: Physician Assistant

## 2018-12-30 ENCOUNTER — Telehealth: Payer: Self-pay | Admitting: Cardiology

## 2018-12-30 DIAGNOSIS — I639 Cerebral infarction, unspecified: Secondary | ICD-10-CM

## 2018-12-30 NOTE — Telephone Encounter (Signed)
Patient calling the office for samples of medication:   1.  What medication and dosage are you requesting samples for? apixaban (ELIQUIS) 5 MG TABS tablet  2.  Are you currently out of this medication?  Pt only has enough for 2 days

## 2018-12-30 NOTE — Telephone Encounter (Signed)
Informed pt that unfortunately we are out of Eliquis samples at this time. Pt voiced understanding.

## 2018-12-30 NOTE — Progress Notes (Signed)
Patient scheduled for [facility] [place] today at 4pm, pt unable to obtain transportation, will call tomorrow to [place]

## 2019-01-20 DIAGNOSIS — J189 Pneumonia, unspecified organism: Secondary | ICD-10-CM

## 2019-01-20 HISTORY — DX: Pneumonia, unspecified organism: J18.9

## 2019-02-01 ENCOUNTER — Inpatient Hospital Stay (HOSPITAL_COMMUNITY)
Admission: EM | Admit: 2019-02-01 | Discharge: 2019-02-13 | DRG: 208 | Disposition: A | Discharge status: Rehabilitation Facility | Payer: Medicare Other | Attending: Student | Admitting: Student

## 2019-02-01 ENCOUNTER — Other Ambulatory Visit: Payer: Self-pay

## 2019-02-01 ENCOUNTER — Observation Stay (HOSPITAL_COMMUNITY): Payer: Medicare Other

## 2019-02-01 ENCOUNTER — Emergency Department (HOSPITAL_COMMUNITY): Payer: Medicare Other

## 2019-02-01 ENCOUNTER — Encounter (HOSPITAL_COMMUNITY): Payer: Self-pay | Admitting: Emergency Medicine

## 2019-02-01 DIAGNOSIS — Z978 Presence of other specified devices: Secondary | ICD-10-CM

## 2019-02-01 DIAGNOSIS — Z20828 Contact with and (suspected) exposure to other viral communicable diseases: Secondary | ICD-10-CM | POA: Diagnosis present

## 2019-02-01 DIAGNOSIS — R4182 Altered mental status, unspecified: Secondary | ICD-10-CM | POA: Diagnosis not present

## 2019-02-01 DIAGNOSIS — Z4659 Encounter for fitting and adjustment of other gastrointestinal appliance and device: Secondary | ICD-10-CM

## 2019-02-01 DIAGNOSIS — K449 Diaphragmatic hernia without obstruction or gangrene: Secondary | ICD-10-CM | POA: Diagnosis present

## 2019-02-01 DIAGNOSIS — I130 Hypertensive heart and chronic kidney disease with heart failure and stage 1 through stage 4 chronic kidney disease, or unspecified chronic kidney disease: Secondary | ICD-10-CM | POA: Diagnosis present

## 2019-02-01 DIAGNOSIS — I5023 Acute on chronic systolic (congestive) heart failure: Secondary | ICD-10-CM | POA: Diagnosis present

## 2019-02-01 DIAGNOSIS — Z880 Allergy status to penicillin: Secondary | ICD-10-CM

## 2019-02-01 DIAGNOSIS — E039 Hypothyroidism, unspecified: Secondary | ICD-10-CM | POA: Diagnosis present

## 2019-02-01 DIAGNOSIS — J9601 Acute respiratory failure with hypoxia: Secondary | ICD-10-CM | POA: Diagnosis present

## 2019-02-01 DIAGNOSIS — M5489 Other dorsalgia: Secondary | ICD-10-CM | POA: Diagnosis not present

## 2019-02-01 DIAGNOSIS — I4819 Other persistent atrial fibrillation: Secondary | ICD-10-CM | POA: Diagnosis present

## 2019-02-01 DIAGNOSIS — R918 Other nonspecific abnormal finding of lung field: Secondary | ICD-10-CM | POA: Diagnosis not present

## 2019-02-01 DIAGNOSIS — E785 Hyperlipidemia, unspecified: Secondary | ICD-10-CM | POA: Diagnosis present

## 2019-02-01 DIAGNOSIS — Z9841 Cataract extraction status, right eye: Secondary | ICD-10-CM

## 2019-02-01 DIAGNOSIS — J690 Pneumonitis due to inhalation of food and vomit: Secondary | ICD-10-CM | POA: Diagnosis not present

## 2019-02-01 DIAGNOSIS — D72829 Elevated white blood cell count, unspecified: Secondary | ICD-10-CM | POA: Diagnosis not present

## 2019-02-01 DIAGNOSIS — R112 Nausea with vomiting, unspecified: Secondary | ICD-10-CM

## 2019-02-01 DIAGNOSIS — E8779 Other fluid overload: Secondary | ICD-10-CM

## 2019-02-01 DIAGNOSIS — J9691 Respiratory failure, unspecified with hypoxia: Secondary | ICD-10-CM

## 2019-02-01 DIAGNOSIS — M1990 Unspecified osteoarthritis, unspecified site: Secondary | ICD-10-CM | POA: Diagnosis present

## 2019-02-01 DIAGNOSIS — G2581 Restless legs syndrome: Secondary | ICD-10-CM | POA: Diagnosis present

## 2019-02-01 DIAGNOSIS — Z981 Arthrodesis status: Secondary | ICD-10-CM

## 2019-02-01 DIAGNOSIS — Z01818 Encounter for other preprocedural examination: Secondary | ICD-10-CM

## 2019-02-01 DIAGNOSIS — K269 Duodenal ulcer, unspecified as acute or chronic, without hemorrhage or perforation: Secondary | ICD-10-CM | POA: Diagnosis present

## 2019-02-01 DIAGNOSIS — M1710 Unilateral primary osteoarthritis, unspecified knee: Secondary | ICD-10-CM | POA: Diagnosis not present

## 2019-02-01 DIAGNOSIS — M1711 Unilateral primary osteoarthritis, right knee: Secondary | ICD-10-CM | POA: Diagnosis present

## 2019-02-01 DIAGNOSIS — Z681 Body mass index (BMI) 19 or less, adult: Secondary | ICD-10-CM

## 2019-02-01 DIAGNOSIS — I480 Paroxysmal atrial fibrillation: Secondary | ICD-10-CM | POA: Diagnosis present

## 2019-02-01 DIAGNOSIS — E440 Moderate protein-calorie malnutrition: Secondary | ICD-10-CM | POA: Diagnosis present

## 2019-02-01 DIAGNOSIS — R0602 Shortness of breath: Secondary | ICD-10-CM

## 2019-02-01 DIAGNOSIS — Z79899 Other long term (current) drug therapy: Secondary | ICD-10-CM

## 2019-02-01 DIAGNOSIS — Z8249 Family history of ischemic heart disease and other diseases of the circulatory system: Secondary | ICD-10-CM

## 2019-02-01 DIAGNOSIS — Z9049 Acquired absence of other specified parts of digestive tract: Secondary | ICD-10-CM

## 2019-02-01 DIAGNOSIS — R197 Diarrhea, unspecified: Secondary | ICD-10-CM

## 2019-02-01 DIAGNOSIS — N189 Chronic kidney disease, unspecified: Secondary | ICD-10-CM | POA: Diagnosis present

## 2019-02-01 DIAGNOSIS — Z79890 Hormone replacement therapy (postmenopausal): Secondary | ICD-10-CM

## 2019-02-01 DIAGNOSIS — F329 Major depressive disorder, single episode, unspecified: Secondary | ICD-10-CM | POA: Diagnosis present

## 2019-02-01 DIAGNOSIS — R410 Disorientation, unspecified: Secondary | ICD-10-CM | POA: Diagnosis not present

## 2019-02-01 DIAGNOSIS — K219 Gastro-esophageal reflux disease without esophagitis: Secondary | ICD-10-CM | POA: Diagnosis present

## 2019-02-01 DIAGNOSIS — G9340 Encephalopathy, unspecified: Secondary | ICD-10-CM

## 2019-02-01 DIAGNOSIS — T4275XA Adverse effect of unspecified antiepileptic and sedative-hypnotic drugs, initial encounter: Secondary | ICD-10-CM | POA: Diagnosis present

## 2019-02-01 DIAGNOSIS — J9621 Acute and chronic respiratory failure with hypoxia: Secondary | ICD-10-CM | POA: Diagnosis present

## 2019-02-01 DIAGNOSIS — G8929 Other chronic pain: Secondary | ICD-10-CM | POA: Diagnosis present

## 2019-02-01 DIAGNOSIS — K529 Noninfective gastroenteritis and colitis, unspecified: Secondary | ICD-10-CM | POA: Diagnosis present

## 2019-02-01 DIAGNOSIS — E876 Hypokalemia: Secondary | ICD-10-CM

## 2019-02-01 DIAGNOSIS — J440 Chronic obstructive pulmonary disease with acute lower respiratory infection: Secondary | ICD-10-CM | POA: Diagnosis present

## 2019-02-01 DIAGNOSIS — Z79891 Long term (current) use of opiate analgesic: Secondary | ICD-10-CM

## 2019-02-01 DIAGNOSIS — K222 Esophageal obstruction: Secondary | ICD-10-CM | POA: Diagnosis present

## 2019-02-01 DIAGNOSIS — J9692 Respiratory failure, unspecified with hypercapnia: Secondary | ICD-10-CM

## 2019-02-01 DIAGNOSIS — Z9889 Other specified postprocedural states: Secondary | ICD-10-CM

## 2019-02-01 DIAGNOSIS — H5340 Unspecified visual field defects: Secondary | ICD-10-CM | POA: Diagnosis present

## 2019-02-01 DIAGNOSIS — Z03818 Encounter for observation for suspected exposure to other biological agents ruled out: Secondary | ICD-10-CM | POA: Diagnosis not present

## 2019-02-01 DIAGNOSIS — I69398 Other sequelae of cerebral infarction: Secondary | ICD-10-CM

## 2019-02-01 DIAGNOSIS — K317 Polyp of stomach and duodenum: Secondary | ICD-10-CM | POA: Diagnosis present

## 2019-02-01 DIAGNOSIS — G92 Toxic encephalopathy: Secondary | ICD-10-CM | POA: Diagnosis present

## 2019-02-01 DIAGNOSIS — Z9842 Cataract extraction status, left eye: Secondary | ICD-10-CM

## 2019-02-01 DIAGNOSIS — Z87891 Personal history of nicotine dependence: Secondary | ICD-10-CM

## 2019-02-01 DIAGNOSIS — Z90710 Acquired absence of both cervix and uterus: Secondary | ICD-10-CM

## 2019-02-01 DIAGNOSIS — Z4682 Encounter for fitting and adjustment of non-vascular catheter: Secondary | ICD-10-CM | POA: Diagnosis not present

## 2019-02-01 DIAGNOSIS — M810 Age-related osteoporosis without current pathological fracture: Secondary | ICD-10-CM | POA: Diagnosis present

## 2019-02-01 DIAGNOSIS — J15211 Pneumonia due to Methicillin susceptible Staphylococcus aureus: Secondary | ICD-10-CM

## 2019-02-01 DIAGNOSIS — J918 Pleural effusion in other conditions classified elsewhere: Secondary | ICD-10-CM | POA: Diagnosis present

## 2019-02-01 DIAGNOSIS — Z96641 Presence of right artificial hip joint: Secondary | ICD-10-CM | POA: Diagnosis present

## 2019-02-01 DIAGNOSIS — Z961 Presence of intraocular lens: Secondary | ICD-10-CM | POA: Diagnosis present

## 2019-02-01 DIAGNOSIS — N179 Acute kidney failure, unspecified: Secondary | ICD-10-CM | POA: Diagnosis present

## 2019-02-01 DIAGNOSIS — Z905 Acquired absence of kidney: Secondary | ICD-10-CM

## 2019-02-01 DIAGNOSIS — I5181 Takotsubo syndrome: Secondary | ICD-10-CM | POA: Diagnosis present

## 2019-02-01 DIAGNOSIS — Z7901 Long term (current) use of anticoagulants: Secondary | ICD-10-CM

## 2019-02-01 DIAGNOSIS — R110 Nausea: Secondary | ICD-10-CM

## 2019-02-01 DIAGNOSIS — F419 Anxiety disorder, unspecified: Secondary | ICD-10-CM | POA: Diagnosis present

## 2019-02-01 DIAGNOSIS — L8990 Pressure ulcer of unspecified site, unspecified stage: Secondary | ICD-10-CM | POA: Insufficient documentation

## 2019-02-01 DIAGNOSIS — K2980 Duodenitis without bleeding: Secondary | ICD-10-CM | POA: Diagnosis present

## 2019-02-01 DIAGNOSIS — T82528A Displacement of other cardiac and vascular devices and implants, initial encounter: Secondary | ICD-10-CM

## 2019-02-01 LAB — URINALYSIS, ROUTINE W REFLEX MICROSCOPIC
Bacteria, UA: NONE SEEN
Bilirubin Urine: NEGATIVE
Glucose, UA: 50 mg/dL — AB
Ketones, ur: 5 mg/dL — AB
Leukocytes,Ua: NEGATIVE
Nitrite: NEGATIVE
Protein, ur: NEGATIVE mg/dL
Specific Gravity, Urine: 1.012 (ref 1.005–1.030)
pH: 7 (ref 5.0–8.0)

## 2019-02-01 LAB — COMPREHENSIVE METABOLIC PANEL
ALT: 23 U/L (ref 0–44)
AST: 49 U/L — ABNORMAL HIGH (ref 15–41)
Albumin: 3.9 g/dL (ref 3.5–5.0)
Alkaline Phosphatase: 91 U/L (ref 38–126)
Anion gap: 13 (ref 5–15)
BUN: 11 mg/dL (ref 8–23)
CO2: 26 mmol/L (ref 22–32)
Calcium: 9.4 mg/dL (ref 8.9–10.3)
Chloride: 100 mmol/L (ref 98–111)
Creatinine, Ser: 0.84 mg/dL (ref 0.44–1.00)
GFR calc Af Amer: >60 mL/min (ref >60)
GFR calc non Af Amer: >60 mL/min (ref >60)
Glucose, Bld: 164 mg/dL — ABNORMAL HIGH (ref 70–99)
Potassium: 2.7 mmol/L — CL (ref 3.5–5.1)
Sodium: 139 mmol/L (ref 135–145)
Total Bilirubin: 1.5 mg/dL — ABNORMAL HIGH (ref 0.3–1.2)
Total Protein: 6.4 g/dL — ABNORMAL LOW (ref 6.5–8.1)

## 2019-02-01 LAB — CBC WITH DIFFERENTIAL/PLATELET
Abs Immature Granulocytes: 0.06 10*3/uL (ref 0.00–0.07)
Basophils Absolute: 0 10*3/uL (ref 0.0–0.1)
Basophils Relative: 0 %
Eosinophils Absolute: 0.1 10*3/uL (ref 0.0–0.5)
Eosinophils Relative: 1 %
HCT: 36.9 % (ref 36.0–46.0)
Hemoglobin: 12.3 g/dL (ref 12.0–15.0)
Immature Granulocytes: 1 %
Lymphocytes Relative: 8 %
Lymphs Abs: 0.9 10*3/uL (ref 0.7–4.0)
MCH: 31.4 pg (ref 26.0–34.0)
MCHC: 33.3 g/dL (ref 30.0–36.0)
MCV: 94.1 fL (ref 80.0–100.0)
Monocytes Absolute: 0.6 10*3/uL (ref 0.1–1.0)
Monocytes Relative: 5 %
Neutro Abs: 9 10*3/uL — ABNORMAL HIGH (ref 1.7–7.7)
Neutrophils Relative %: 85 %
Platelets: 277 10*3/uL (ref 150–400)
RBC: 3.92 MIL/uL (ref 3.87–5.11)
RDW: 11.9 % (ref 11.5–15.5)
WBC: 10.6 10*3/uL — ABNORMAL HIGH (ref 4.0–10.5)
nRBC: 0 % (ref 0.0–0.2)

## 2019-02-01 LAB — BLOOD GAS, ARTERIAL
Acid-Base Excess: 1.1 mmol/L (ref 0.0–2.0)
Bicarbonate: 24.5 mmol/L (ref 20.0–28.0)
Drawn by: 54887
O2 Content: 2 L/min
O2 Saturation: 82.6 %
Patient temperature: 98.6
pCO2 arterial: 34.1 mmHg (ref 32.0–48.0)
pH, Arterial: 7.469 — ABNORMAL HIGH (ref 7.350–7.450)
pO2, Arterial: 48.7 mmHg — ABNORMAL LOW (ref 83.0–108.0)

## 2019-02-01 LAB — POCT I-STAT 7, (LYTES, BLD GAS, ICA,H+H)
Acid-base deficit: 4 mmol/L — ABNORMAL HIGH (ref 0.0–2.0)
Bicarbonate: 20.1 mmol/L (ref 20.0–28.0)
Calcium, Ion: 1.21 mmol/L (ref 1.15–1.40)
HCT: 40 % (ref 36.0–46.0)
Hemoglobin: 13.6 g/dL (ref 12.0–15.0)
O2 Saturation: 100 %
Patient temperature: 98.1
Potassium: 2.7 mmol/L — CL (ref 3.5–5.1)
Sodium: 136 mmol/L (ref 135–145)
TCO2: 21 mmol/L — ABNORMAL LOW (ref 22–32)
pCO2 arterial: 32.2 mmHg (ref 32.0–48.0)
pH, Arterial: 7.401 (ref 7.350–7.450)
pO2, Arterial: 244 mmHg — ABNORMAL HIGH (ref 83.0–108.0)

## 2019-02-01 LAB — GLUCOSE, CAPILLARY
Glucose-Capillary: 139 mg/dL — ABNORMAL HIGH (ref 70–99)
Glucose-Capillary: 178 mg/dL — ABNORMAL HIGH (ref 70–99)
Glucose-Capillary: 239 mg/dL — ABNORMAL HIGH (ref 70–99)

## 2019-02-01 LAB — C-REACTIVE PROTEIN: CRP: <0.8 mg/dL (ref <1.0)

## 2019-02-01 LAB — MAGNESIUM: Magnesium: 1.3 mg/dL — ABNORMAL LOW (ref 1.7–2.4)

## 2019-02-01 LAB — SEDIMENTATION RATE: Sed Rate: 1 mm/hr (ref 0–22)

## 2019-02-01 LAB — RAPID URINE DRUG SCREEN, HOSP PERFORMED
Amphetamines: NOT DETECTED
Barbiturates: NOT DETECTED
Benzodiazepines: NOT DETECTED
Cocaine: NOT DETECTED
Opiates: POSITIVE — AB
Tetrahydrocannabinol: NOT DETECTED

## 2019-02-01 LAB — ETHANOL: Alcohol, Ethyl (B): <10 mg/dL (ref <10)

## 2019-02-01 LAB — MRSA PCR SCREENING: MRSA by PCR: NEGATIVE

## 2019-02-01 LAB — SARS CORONAVIRUS 2: SARS Coronavirus 2: NOT DETECTED

## 2019-02-01 MED ORDER — DEXMEDETOMIDINE HCL IN NACL 400 MCG/100ML IV SOLN
0.0000 ug/kg/h | INTRAVENOUS | Status: DC
  Administered 2019-02-01: 0.4 ug/kg/h via INTRAVENOUS
  Filled 2019-02-01: qty 100

## 2019-02-01 MED ORDER — MIDAZOLAM HCL 2 MG/2ML IJ SOLN
INTRAMUSCULAR | Status: AC
  Administered 2019-02-01: 19:00:00 2 mg
  Filled 2019-02-01: qty 2

## 2019-02-01 MED ORDER — LEVOTHYROXINE SODIUM 88 MCG PO TABS
88.0000 ug | ORAL_TABLET | Freq: Every day | ORAL | Status: DC
  Filled 2019-02-01: qty 1

## 2019-02-01 MED ORDER — FENTANYL CITRATE (PF) 100 MCG/2ML IJ SOLN: 25.0000 ug | Freq: Once | INTRAMUSCULAR | Status: DC

## 2019-02-01 MED ORDER — INSULIN ASPART 100 UNIT/ML ~~LOC~~ SOLN
0.0000 [IU] | SUBCUTANEOUS | Status: DC
  Administered 2019-02-01: 5 [IU] via SUBCUTANEOUS
  Administered 2019-02-02 – 2019-02-11 (×4): 2 [IU] via SUBCUTANEOUS

## 2019-02-01 MED ORDER — ONDANSETRON HCL 4 MG PO TABS: 4.0000 mg | ORAL_TABLET | Freq: Four times a day (QID) | ORAL | Status: DC | PRN

## 2019-02-01 MED ORDER — SODIUM CHLORIDE 0.9 % IV SOLN: INTRAVENOUS | Status: DC | PRN

## 2019-02-01 MED ORDER — SODIUM CHLORIDE 0.9 % IV BOLUS
500.0000 mL | Freq: Once | INTRAVENOUS | Status: AC
  Administered 2019-02-01: 500 mL via INTRAVENOUS

## 2019-02-01 MED ORDER — LORAZEPAM 2 MG/ML IJ SOLN
0.5000 mg | Freq: Once | INTRAMUSCULAR | Status: AC
  Administered 2019-02-01: 0.5 mg via INTRAVENOUS
  Filled 2019-02-01: qty 1

## 2019-02-01 MED ORDER — MIDAZOLAM HCL 2 MG/2ML IJ SOLN
1.0000 mg | INTRAMUSCULAR | Status: DC | PRN
  Administered 2019-02-01 – 2019-02-02 (×3): 1 mg via INTRAVENOUS
  Administered 2019-02-02: 2 mg via INTRAVENOUS
  Filled 2019-02-01 (×4): qty 2

## 2019-02-01 MED ORDER — FLUTICASONE PROPIONATE 50 MCG/ACT NA SUSP
1.0000 | Freq: Every day | NASAL | Status: DC | PRN
  Filled 2019-02-01: qty 16

## 2019-02-01 MED ORDER — ROPINIROLE HCL 1 MG PO TABS
1.0000 mg | ORAL_TABLET | Freq: Every evening | ORAL | Status: DC | PRN
  Filled 2019-02-01: qty 1

## 2019-02-01 MED ORDER — MAGNESIUM SULFATE 2 GM/50ML IV SOLN
2.0000 g | Freq: Once | INTRAVENOUS | Status: AC
  Administered 2019-02-01: 2 g via INTRAVENOUS
  Filled 2019-02-01: qty 50

## 2019-02-01 MED ORDER — ALBUTEROL SULFATE (2.5 MG/3ML) 0.083% IN NEBU: 2.5000 mg | INHALATION_SOLUTION | RESPIRATORY_TRACT | Status: DC | PRN

## 2019-02-01 MED ORDER — ONDANSETRON HCL 4 MG/2ML IJ SOLN: 4.0000 mg | Freq: Four times a day (QID) | INTRAMUSCULAR | Status: DC | PRN

## 2019-02-01 MED ORDER — ORAL CARE MOUTH RINSE
15.0000 mL | OROMUCOSAL | Status: DC
  Administered 2019-02-01 – 2019-02-04 (×27): 15 mL via OROMUCOSAL

## 2019-02-01 MED ORDER — LEVOTHYROXINE SODIUM 88 MCG PO TABS
88.0000 ug | ORAL_TABLET | Freq: Every day | ORAL | Status: DC
  Administered 2019-02-02 – 2019-02-06 (×4): 88 ug
  Filled 2019-02-01 (×5): qty 1

## 2019-02-01 MED ORDER — NALOXONE HCL 0.4 MG/ML IJ SOLN
INTRAMUSCULAR | Status: AC
  Administered 2019-02-01: 0.4 mg via INTRAVENOUS
  Filled 2019-02-01: qty 1

## 2019-02-01 MED ORDER — ACETAMINOPHEN 650 MG RE SUPP: 650.0000 mg | Freq: Four times a day (QID) | RECTAL | Status: DC | PRN

## 2019-02-01 MED ORDER — ATORVASTATIN CALCIUM 10 MG PO TABS
10.0000 mg | ORAL_TABLET | Freq: Every day | ORAL | Status: DC
  Filled 2019-02-01: qty 1

## 2019-02-01 MED ORDER — FENTANYL 100 MCG/HR TD PT72: 1.0000 | MEDICATED_PATCH | TRANSDERMAL | Status: DC

## 2019-02-01 MED ORDER — POTASSIUM CHLORIDE 10 MEQ/50ML IV SOLN
10.0000 meq | INTRAVENOUS | Status: AC
  Administered 2019-02-01 – 2019-02-02 (×6): 10 meq via INTRAVENOUS
  Filled 2019-02-01 (×6): qty 50

## 2019-02-01 MED ORDER — POTASSIUM CHLORIDE CRYS ER 20 MEQ PO TBCR: 20.0000 meq | EXTENDED_RELEASE_TABLET | Freq: Every day | ORAL | Status: DC

## 2019-02-01 MED ORDER — IRBESARTAN 300 MG PO TABS
300.0000 mg | ORAL_TABLET | Freq: Every day | ORAL | Status: DC
  Filled 2019-02-01: qty 1

## 2019-02-01 MED ORDER — NALOXONE HCL 0.4 MG/ML IJ SOLN
0.4000 mg | INTRAMUSCULAR | Status: DC | PRN
  Administered 2019-02-01: 17:00:00 0.4 mg via INTRAVENOUS

## 2019-02-01 MED ORDER — SODIUM CHLORIDE 0.9 % IV SOLN
1.0000 g | INTRAVENOUS | Status: DC
  Filled 2019-02-01 (×2): qty 10

## 2019-02-01 MED ORDER — NOREPINEPHRINE 4 MG/250ML-% IV SOLN
INTRAVENOUS | Status: AC
  Administered 2019-02-01: 15 ug/min via INTRAVENOUS
  Filled 2019-02-01: qty 250

## 2019-02-01 MED ORDER — DILTIAZEM HCL ER COATED BEADS 240 MG PO CP24
240.0000 mg | ORAL_CAPSULE | Freq: Every day | ORAL | Status: DC
  Filled 2019-02-01: qty 1

## 2019-02-01 MED ORDER — ACETAMINOPHEN 325 MG PO TABS: 650.0000 mg | ORAL_TABLET | Freq: Four times a day (QID) | ORAL | Status: DC | PRN

## 2019-02-01 MED ORDER — FENTANYL CITRATE (PF) 100 MCG/2ML IJ SOLN
INTRAMUSCULAR | Status: AC
  Administered 2019-02-01: 19:00:00 100 ug
  Filled 2019-02-01: qty 2

## 2019-02-01 MED ORDER — PANTOPRAZOLE SODIUM 40 MG PO PACK
40.0000 mg | PACK | Freq: Two times a day (BID) | ORAL | Status: DC
  Administered 2019-02-01 – 2019-02-04 (×6): 40 mg
  Filled 2019-02-01 (×8): qty 20

## 2019-02-01 MED ORDER — NOREPINEPHRINE 4 MG/250ML-% IV SOLN
0.0000 ug/min | INTRAVENOUS | Status: DC
  Administered 2019-02-01: 22:00:00 15 ug/min via INTRAVENOUS
  Filled 2019-02-01: qty 250

## 2019-02-01 MED ORDER — CHLORHEXIDINE GLUCONATE 0.12% ORAL RINSE (MEDLINE KIT)
15.0000 mL | Freq: Two times a day (BID) | OROMUCOSAL | Status: DC
  Administered 2019-02-01 – 2019-02-04 (×6): 15 mL via OROMUCOSAL

## 2019-02-01 MED ORDER — NALOXONE HCL 0.4 MG/ML IJ SOLN
INTRAMUSCULAR | Status: AC
  Administered 2019-02-01: 0.4 mg
  Filled 2019-02-01: qty 1

## 2019-02-01 MED ORDER — FENTANYL BOLUS VIA INFUSION
25.0000 ug | INTRAVENOUS | Status: DC | PRN
  Administered 2019-02-01 – 2019-02-02 (×10): 25 ug via INTRAVENOUS
  Filled 2019-02-01: qty 25

## 2019-02-01 MED ORDER — FENTANYL CITRATE (PF) 100 MCG/2ML IJ SOLN
INTRAMUSCULAR | Status: AC
  Administered 2019-02-01: 100 ug
  Filled 2019-02-01: qty 2

## 2019-02-01 MED ORDER — ATORVASTATIN CALCIUM 10 MG PO TABS
10.0000 mg | ORAL_TABLET | Freq: Every day | ORAL | Status: DC
  Administered 2019-02-02 – 2019-02-03 (×2): 10 mg
  Filled 2019-02-01 (×4): qty 1

## 2019-02-01 MED ORDER — FENTANYL 2500MCG IN NS 250ML (10MCG/ML) PREMIX INFUSION
25.0000 ug/h | INTRAVENOUS | Status: DC
  Administered 2019-02-01: 100 ug/h via INTRAVENOUS
  Administered 2019-02-02: 150 ug/h via INTRAVENOUS
  Administered 2019-02-03: 175 ug/h via INTRAVENOUS
  Administered 2019-02-03: 150 ug/h via INTRAVENOUS
  Administered 2019-02-03: 175 ug/h via INTRAVENOUS
  Filled 2019-02-01 (×5): qty 250

## 2019-02-01 MED ORDER — POTASSIUM CHLORIDE 10 MEQ/100ML IV SOLN
10.0000 meq | INTRAVENOUS | Status: AC
  Administered 2019-02-01 (×2): 10 meq via INTRAVENOUS
  Filled 2019-02-01 (×2): qty 100

## 2019-02-01 MED ORDER — POTASSIUM CHLORIDE 10 MEQ/100ML IV SOLN
10.0000 meq | INTRAVENOUS | Status: AC
  Administered 2019-02-01: 10 meq via INTRAVENOUS
  Filled 2019-02-01: qty 100

## 2019-02-01 MED ORDER — PANTOPRAZOLE SODIUM 40 MG PO TBEC: 40.0000 mg | DELAYED_RELEASE_TABLET | Freq: Two times a day (BID) | ORAL | Status: DC

## 2019-02-01 MED ORDER — APIXABAN 5 MG PO TABS
5.0000 mg | ORAL_TABLET | Freq: Two times a day (BID) | ORAL | Status: DC
  Filled 2019-02-01: qty 1

## 2019-02-01 MED ORDER — APIXABAN 5 MG PO TABS
5.0000 mg | ORAL_TABLET | Freq: Two times a day (BID) | ORAL | Status: DC
  Administered 2019-02-02 (×2): 5 mg
  Filled 2019-02-01 (×2): qty 1
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
## 2019-02-01 NOTE — Progress Notes (Signed)
Pulled back ETT 2cm- 26 to 24 at the lips per CCMD

## 2019-02-01 NOTE — Progress Notes (Signed)
Patient arrived from the ED somnolent and hard to arouse with labored breathing. Fentanyl patch removed from lower back. Covid sample was sent to lab. Patient has bruise on rt. upper arm and swollen left knee. Rapid response was called and patient transferred to 81M.

## 2019-02-01 NOTE — Progress Notes (Signed)
Per ABG results 247

## 2019-02-01 NOTE — Significant Event (Signed)
Rapid Response Event Note  Overview: Respiratory - Low PaO2  Initial Focused Assessment: I was called by RT to evaluate patient for increased WOB and hypoxia. ABG was ordered by admitting MD in the ED and it was collected on arrival on 2W, the PaO2 was 48. RR was in the 40s, patient was very hard to arouse, would moan to pain at times. Lung sounds were rhonchi through out and diminished in the bases. RT placed the patient on a NRB 15L after ABG but prior was on 2L [place] and saturations were in the low 80s. I paged the [facility] MD and MD came to bedside. PCCM consulted. HR and BP were stable, hands and feet were dusky and very cool to touch, rectal temperature 98.5. CXR + aspiration   Interventions: -- Narcan x 2 (total 0.8 mg IV) - after the second dose, patient did have brief periods of severe agitation (thrashing her head and legs around in the bed).   Plan of Care: -- PCCM MDs came to bedside, will transfer to 2M05 for intubation.   Event Summary:  Call Time 1615 End Time 1815  Dejanique Ruehl R
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
## 2019-02-01 NOTE — ED Triage Notes (Signed)
Pt to ER for evaluation of right knee pain, reports chronic in nature and wears fentanyl patches for pain. No new injury reported. Reports worse since midnight.
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
## 2019-02-01 NOTE — ED Notes (Signed)
Patient transported to X-ray 

## 2019-02-01 NOTE — Progress Notes (Signed)
Patient placed on 100% FIO2 and 50L Heated High flow. Patient taken off and intubated.

## 2019-02-02 ENCOUNTER — Encounter (HOSPITAL_COMMUNITY): Payer: Self-pay | Admitting: Pulmonary Disease

## 2019-02-02 ENCOUNTER — Observation Stay (HOSPITAL_COMMUNITY): Payer: Medicare Other

## 2019-02-02 ENCOUNTER — Inpatient Hospital Stay (HOSPITAL_COMMUNITY): Payer: Medicare Other

## 2019-02-02 DIAGNOSIS — G92 Toxic encephalopathy: Secondary | ICD-10-CM | POA: Diagnosis not present

## 2019-02-02 DIAGNOSIS — G9340 Encephalopathy, unspecified: Secondary | ICD-10-CM

## 2019-02-02 DIAGNOSIS — J984 Other disorders of lung: Secondary | ICD-10-CM | POA: Diagnosis not present

## 2019-02-02 DIAGNOSIS — K2980 Duodenitis without bleeding: Secondary | ICD-10-CM | POA: Diagnosis not present

## 2019-02-02 DIAGNOSIS — R197 Diarrhea, unspecified: Secondary | ICD-10-CM | POA: Diagnosis not present

## 2019-02-02 DIAGNOSIS — A419 Sepsis, unspecified organism: Secondary | ICD-10-CM

## 2019-02-02 DIAGNOSIS — Z79890 Hormone replacement therapy (postmenopausal): Secondary | ICD-10-CM | POA: Diagnosis not present

## 2019-02-02 DIAGNOSIS — I361 Nonrheumatic tricuspid (valve) insufficiency: Secondary | ICD-10-CM | POA: Diagnosis not present

## 2019-02-02 DIAGNOSIS — K317 Polyp of stomach and duodenum: Secondary | ICD-10-CM | POA: Diagnosis not present

## 2019-02-02 DIAGNOSIS — K449 Diaphragmatic hernia without obstruction or gangrene: Secondary | ICD-10-CM | POA: Diagnosis present

## 2019-02-02 DIAGNOSIS — I214 Non-ST elevation (NSTEMI) myocardial infarction: Secondary | ICD-10-CM

## 2019-02-02 DIAGNOSIS — N189 Chronic kidney disease, unspecified: Secondary | ICD-10-CM | POA: Diagnosis present

## 2019-02-02 DIAGNOSIS — I130 Hypertensive heart and chronic kidney disease with heart failure and stage 1 through stage 4 chronic kidney disease, or unspecified chronic kidney disease: Secondary | ICD-10-CM | POA: Diagnosis present

## 2019-02-02 DIAGNOSIS — E7800 Pure hypercholesterolemia, unspecified: Secondary | ICD-10-CM | POA: Diagnosis present

## 2019-02-02 DIAGNOSIS — E785 Hyperlipidemia, unspecified: Secondary | ICD-10-CM | POA: Diagnosis present

## 2019-02-02 DIAGNOSIS — Z7901 Long term (current) use of anticoagulants: Secondary | ICD-10-CM | POA: Diagnosis not present

## 2019-02-02 DIAGNOSIS — J449 Chronic obstructive pulmonary disease, unspecified: Secondary | ICD-10-CM | POA: Diagnosis present

## 2019-02-02 DIAGNOSIS — I5023 Acute on chronic systolic (congestive) heart failure: Secondary | ICD-10-CM | POA: Diagnosis present

## 2019-02-02 DIAGNOSIS — Z87891 Personal history of nicotine dependence: Secondary | ICD-10-CM | POA: Diagnosis not present

## 2019-02-02 DIAGNOSIS — H5340 Unspecified visual field defects: Secondary | ICD-10-CM | POA: Diagnosis present

## 2019-02-02 DIAGNOSIS — K269 Duodenal ulcer, unspecified as acute or chronic, without hemorrhage or perforation: Secondary | ICD-10-CM | POA: Diagnosis not present

## 2019-02-02 DIAGNOSIS — I509 Heart failure, unspecified: Secondary | ICD-10-CM | POA: Diagnosis not present

## 2019-02-02 DIAGNOSIS — E876 Hypokalemia: Secondary | ICD-10-CM | POA: Diagnosis present

## 2019-02-02 DIAGNOSIS — J9601 Acute respiratory failure with hypoxia: Secondary | ICD-10-CM

## 2019-02-02 DIAGNOSIS — D72829 Elevated white blood cell count, unspecified: Secondary | ICD-10-CM

## 2019-02-02 DIAGNOSIS — R6521 Severe sepsis with septic shock: Secondary | ICD-10-CM | POA: Diagnosis not present

## 2019-02-02 DIAGNOSIS — J90 Pleural effusion, not elsewhere classified: Secondary | ICD-10-CM | POA: Diagnosis not present

## 2019-02-02 DIAGNOSIS — E441 Mild protein-calorie malnutrition: Secondary | ICD-10-CM | POA: Diagnosis present

## 2019-02-02 DIAGNOSIS — Z20828 Contact with and (suspected) exposure to other viral communicable diseases: Secondary | ICD-10-CM | POA: Diagnosis not present

## 2019-02-02 DIAGNOSIS — R091 Pleurisy: Secondary | ICD-10-CM | POA: Diagnosis not present

## 2019-02-02 DIAGNOSIS — I5181 Takotsubo syndrome: Secondary | ICD-10-CM | POA: Diagnosis present

## 2019-02-02 DIAGNOSIS — Z880 Allergy status to penicillin: Secondary | ICD-10-CM | POA: Diagnosis not present

## 2019-02-02 DIAGNOSIS — J9621 Acute and chronic respiratory failure with hypoxia: Secondary | ICD-10-CM | POA: Diagnosis present

## 2019-02-02 DIAGNOSIS — M810 Age-related osteoporosis without current pathological fracture: Secondary | ICD-10-CM | POA: Diagnosis present

## 2019-02-02 DIAGNOSIS — K838 Other specified diseases of biliary tract: Secondary | ICD-10-CM | POA: Diagnosis not present

## 2019-02-02 DIAGNOSIS — I4819 Other persistent atrial fibrillation: Secondary | ICD-10-CM | POA: Diagnosis present

## 2019-02-02 DIAGNOSIS — K219 Gastro-esophageal reflux disease without esophagitis: Secondary | ICD-10-CM | POA: Diagnosis present

## 2019-02-02 DIAGNOSIS — I340 Nonrheumatic mitral (valve) insufficiency: Secondary | ICD-10-CM

## 2019-02-02 DIAGNOSIS — J440 Chronic obstructive pulmonary disease with acute lower respiratory infection: Secondary | ICD-10-CM | POA: Diagnosis not present

## 2019-02-02 DIAGNOSIS — I482 Chronic atrial fibrillation, unspecified: Secondary | ICD-10-CM

## 2019-02-02 DIAGNOSIS — M961 Postlaminectomy syndrome, not elsewhere classified: Secondary | ICD-10-CM | POA: Diagnosis not present

## 2019-02-02 DIAGNOSIS — J918 Pleural effusion in other conditions classified elsewhere: Secondary | ICD-10-CM | POA: Diagnosis present

## 2019-02-02 DIAGNOSIS — J9690 Respiratory failure, unspecified, unspecified whether with hypoxia or hypercapnia: Secondary | ICD-10-CM | POA: Diagnosis not present

## 2019-02-02 DIAGNOSIS — I5021 Acute systolic (congestive) heart failure: Secondary | ICD-10-CM | POA: Diagnosis not present

## 2019-02-02 DIAGNOSIS — G8929 Other chronic pain: Secondary | ICD-10-CM | POA: Diagnosis present

## 2019-02-02 DIAGNOSIS — M1711 Unilateral primary osteoarthritis, right knee: Secondary | ICD-10-CM | POA: Diagnosis not present

## 2019-02-02 DIAGNOSIS — M1990 Unspecified osteoarthritis, unspecified site: Secondary | ICD-10-CM | POA: Diagnosis present

## 2019-02-02 DIAGNOSIS — N179 Acute kidney failure, unspecified: Secondary | ICD-10-CM | POA: Diagnosis present

## 2019-02-02 DIAGNOSIS — I429 Cardiomyopathy, unspecified: Secondary | ICD-10-CM | POA: Diagnosis not present

## 2019-02-02 DIAGNOSIS — Z681 Body mass index (BMI) 19 or less, adult: Secondary | ICD-10-CM | POA: Diagnosis not present

## 2019-02-02 DIAGNOSIS — R5381 Other malaise: Secondary | ICD-10-CM | POA: Diagnosis not present

## 2019-02-02 DIAGNOSIS — E039 Hypothyroidism, unspecified: Secondary | ICD-10-CM | POA: Diagnosis not present

## 2019-02-02 DIAGNOSIS — Z8249 Family history of ischemic heart disease and other diseases of the circulatory system: Secondary | ICD-10-CM | POA: Diagnosis not present

## 2019-02-02 DIAGNOSIS — Z981 Arthrodesis status: Secondary | ICD-10-CM | POA: Diagnosis not present

## 2019-02-02 DIAGNOSIS — Z4682 Encounter for fitting and adjustment of non-vascular catheter: Secondary | ICD-10-CM | POA: Diagnosis not present

## 2019-02-02 DIAGNOSIS — K8689 Other specified diseases of pancreas: Secondary | ICD-10-CM | POA: Diagnosis not present

## 2019-02-02 DIAGNOSIS — R918 Other nonspecific abnormal finding of lung field: Secondary | ICD-10-CM | POA: Diagnosis not present

## 2019-02-02 DIAGNOSIS — J15211 Pneumonia due to Methicillin susceptible Staphylococcus aureus: Secondary | ICD-10-CM | POA: Diagnosis not present

## 2019-02-02 DIAGNOSIS — E8779 Other fluid overload: Secondary | ICD-10-CM | POA: Diagnosis not present

## 2019-02-02 DIAGNOSIS — I129 Hypertensive chronic kidney disease with stage 1 through stage 4 chronic kidney disease, or unspecified chronic kidney disease: Secondary | ICD-10-CM | POA: Diagnosis present

## 2019-02-02 DIAGNOSIS — Z96641 Presence of right artificial hip joint: Secondary | ICD-10-CM | POA: Diagnosis present

## 2019-02-02 DIAGNOSIS — E440 Moderate protein-calorie malnutrition: Secondary | ICD-10-CM | POA: Diagnosis present

## 2019-02-02 DIAGNOSIS — G2581 Restless legs syndrome: Secondary | ICD-10-CM | POA: Diagnosis present

## 2019-02-02 DIAGNOSIS — K529 Noninfective gastroenteritis and colitis, unspecified: Secondary | ICD-10-CM | POA: Diagnosis not present

## 2019-02-02 DIAGNOSIS — J690 Pneumonitis due to inhalation of food and vomit: Secondary | ICD-10-CM | POA: Diagnosis not present

## 2019-02-02 DIAGNOSIS — I69398 Other sequelae of cerebral infarction: Secondary | ICD-10-CM | POA: Diagnosis not present

## 2019-02-02 DIAGNOSIS — Z7951 Long term (current) use of inhaled steroids: Secondary | ICD-10-CM | POA: Diagnosis not present

## 2019-02-02 DIAGNOSIS — R112 Nausea with vomiting, unspecified: Secondary | ICD-10-CM | POA: Diagnosis not present

## 2019-02-02 LAB — TROPONIN I
Troponin I: 2.83 ng/mL (ref <0.03)
Troponin I: 3.63 ng/mL (ref <0.03)
Troponin I: 2.97 ng/mL (ref <0.03)
Troponin I: 1.65 ng/mL (ref <0.03)
Troponin I: 1.36 ng/mL (ref <0.03)

## 2019-02-02 LAB — BASIC METABOLIC PANEL
Anion gap: 7 (ref 5–15)
BUN: 16 mg/dL (ref 8–23)
CO2: 23 mmol/L (ref 22–32)
Calcium: 9.3 mg/dL (ref 8.9–10.3)
Chloride: 107 mmol/L (ref 98–111)
Creatinine, Ser: 1.12 mg/dL — ABNORMAL HIGH (ref 0.44–1.00)
GFR calc Af Amer: 56 mL/min — ABNORMAL LOW (ref >60)
GFR calc non Af Amer: 49 mL/min — ABNORMAL LOW (ref >60)
Glucose, Bld: 75 mg/dL (ref 70–99)
Potassium: 5.1 mmol/L (ref 3.5–5.1)
Sodium: 137 mmol/L (ref 135–145)

## 2019-02-02 LAB — GLUCOSE, CAPILLARY
Glucose-Capillary: 79 mg/dL (ref 70–99)
Glucose-Capillary: 62 mg/dL — ABNORMAL LOW (ref 70–99)
Glucose-Capillary: 107 mg/dL — ABNORMAL HIGH (ref 70–99)
Glucose-Capillary: 78 mg/dL (ref 70–99)
Glucose-Capillary: 85 mg/dL (ref 70–99)
Glucose-Capillary: 82 mg/dL (ref 70–99)

## 2019-02-02 LAB — TSH: TSH: 1.432 u[IU]/mL (ref 0.350–4.500)

## 2019-02-02 LAB — CBC
HCT: 39.7 % (ref 36.0–46.0)
Hemoglobin: 13.4 g/dL (ref 12.0–15.0)
MCH: 32 pg (ref 26.0–34.0)
MCHC: 33.8 g/dL (ref 30.0–36.0)
MCV: 94.7 fL (ref 80.0–100.0)
Platelets: 306 10*3/uL (ref 150–400)
RBC: 4.19 MIL/uL (ref 3.87–5.11)
RDW: 12.6 % (ref 11.5–15.5)
WBC: 18 10*3/uL — ABNORMAL HIGH (ref 4.0–10.5)
nRBC: 0 % (ref 0.0–0.2)

## 2019-02-02 LAB — HEPARIN LEVEL (UNFRACTIONATED): Heparin Unfractionated: >2.2 IU/mL — ABNORMAL HIGH (ref 0.30–0.70)

## 2019-02-02 LAB — MAGNESIUM: Magnesium: 2.1 mg/dL (ref 1.7–2.4)

## 2019-02-02 LAB — APTT: aPTT: 40 seconds — ABNORMAL HIGH (ref 24–36)

## 2019-02-02 MED ORDER — ONDANSETRON HCL 4 MG PO TABS
4.0000 mg | ORAL_TABLET | Freq: Four times a day (QID) | ORAL | Status: DC | PRN
  Administered 2019-02-09 – 2019-02-11 (×2): 4 mg
  Filled 2019-02-02 (×2): qty 1

## 2019-02-02 MED ORDER — DILTIAZEM 12 MG/ML ORAL SUSPENSION
40.0000 mg | Freq: Four times a day (QID) | ORAL | Status: DC
  Administered 2019-02-02 (×3): 40 mg
  Filled 2019-02-02 (×4): qty 6

## 2019-02-02 MED ORDER — LORAZEPAM 2 MG/ML IJ SOLN
0.5000 mg | INTRAMUSCULAR | Status: DC | PRN
  Administered 2019-02-03 – 2019-02-04 (×4): 0.5 mg via INTRAVENOUS
  Filled 2019-02-02 (×4): qty 1

## 2019-02-02 MED ORDER — ACETAMINOPHEN 650 MG RE SUPP: 650.0000 mg | Freq: Four times a day (QID) | RECTAL | Status: DC | PRN

## 2019-02-02 MED ORDER — SODIUM CHLORIDE 0.9 % IV BOLUS
500.0000 mL | Freq: Once | INTRAVENOUS | Status: AC
  Administered 2019-02-02: 500 mL via INTRAVENOUS

## 2019-02-02 MED ORDER — DEXTROSE 50 % IV SOLN
INTRAVENOUS | Status: AC
  Administered 2019-02-02: 50 mL
  Filled 2019-02-02: qty 50

## 2019-02-02 MED ORDER — DILTIAZEM 12 MG/ML ORAL SUSPENSION
40.0000 mg | Freq: Four times a day (QID) | ORAL | Status: DC
  Filled 2019-02-02: qty 6

## 2019-02-02 MED ORDER — MIDAZOLAM HCL 2 MG/2ML IJ SOLN
1.0000 mg | INTRAMUSCULAR | Status: DC | PRN
  Administered 2019-02-02 (×5): 1 mg via INTRAVENOUS
  Filled 2019-02-02 (×5): qty 2

## 2019-02-02 MED ORDER — SODIUM CHLORIDE 0.9 % IV SOLN
INTRAVENOUS | Status: DC
  Administered 2019-02-02: 09:00:00 via INTRAVENOUS

## 2019-02-02 MED ORDER — HEPARIN (PORCINE) 25000 UT/250ML-% IV SOLN
1150.0000 [IU]/h | INTRAVENOUS | Status: DC
  Administered 2019-02-02: 650 [IU]/h via INTRAVENOUS
  Administered 2019-02-04 – 2019-02-05 (×2): 900 [IU]/h via INTRAVENOUS
  Administered 2019-02-06: 1000 [IU]/h via INTRAVENOUS
  Administered 2019-02-07: 1100 [IU]/h via INTRAVENOUS
  Administered 2019-02-08 – 2019-02-09 (×2): 1200 [IU]/h via INTRAVENOUS
  Filled 2019-02-02 (×8): qty 250

## 2019-02-02 MED ORDER — IRBESARTAN 300 MG PO TABS: 300.0000 mg | ORAL_TABLET | Freq: Every day | ORAL | Status: DC

## 2019-02-02 MED ORDER — VITAL HIGH PROTEIN PO LIQD
1000.0000 mL | ORAL | Status: DC
  Administered 2019-02-02 – 2019-02-03 (×2): 1000 mL

## 2019-02-02 MED ORDER — SODIUM CHLORIDE 0.9 % IV BOLUS: 500.0000 mL | Freq: Once | INTRAVENOUS | Status: DC

## 2019-02-02 MED ORDER — POTASSIUM CHLORIDE CRYS ER 20 MEQ PO TBCR: 20.0000 meq | EXTENDED_RELEASE_TABLET | Freq: Every day | ORAL | Status: DC

## 2019-02-02 MED ORDER — METOPROLOL TARTRATE 5 MG/5ML IV SOLN: 2.5000 mg | Freq: Once | INTRAVENOUS | Status: DC

## 2019-02-02 MED ORDER — ONDANSETRON HCL 4 MG/2ML IJ SOLN
4.0000 mg | Freq: Four times a day (QID) | INTRAMUSCULAR | Status: DC | PRN
  Administered 2019-02-04: 4 mg via INTRAVENOUS
  Filled 2019-02-02: qty 2

## 2019-02-02 MED ORDER — SODIUM CHLORIDE 0.9 % IV SOLN
2.0000 g | INTRAVENOUS | Status: DC
  Administered 2019-02-02: 2 g via INTRAVENOUS
  Filled 2019-02-02 (×2): qty 20

## 2019-02-02 MED ORDER — SODIUM CHLORIDE 0.9% FLUSH: 10.0000 mL | INTRAVENOUS | Status: DC | PRN

## 2019-02-02 MED ORDER — ROPINIROLE HCL 1 MG PO TABS
1.0000 mg | ORAL_TABLET | Freq: Every evening | ORAL | Status: DC | PRN
  Administered 2019-02-03: 1 mg
  Filled 2019-02-02 (×3): qty 1

## 2019-02-02 MED ORDER — CHLORHEXIDINE GLUCONATE CLOTH 2 % EX PADS
6.0000 | MEDICATED_PAD | Freq: Every day | CUTANEOUS | Status: DC
  Administered 2019-02-02 – 2019-02-12 (×9): 6 via TOPICAL

## 2019-02-02 MED ORDER — SODIUM CHLORIDE 0.9% FLUSH
10.0000 mL | Freq: Two times a day (BID) | INTRAVENOUS | Status: DC
  Administered 2019-02-02 – 2019-02-06 (×8): 10 mL

## 2019-02-02 MED ORDER — DEXTROSE 50 % IV SOLN: 1.0000 | Freq: Once | INTRAVENOUS | Status: AC

## 2019-02-02 MED ORDER — ACETAMINOPHEN 325 MG PO TABS: 650.0000 mg | ORAL_TABLET | Freq: Four times a day (QID) | ORAL | Status: DC | PRN

## 2019-02-02 MED ORDER — FENTANYL BOLUS VIA INFUSION
25.0000 ug | INTRAVENOUS | Status: DC | PRN
  Administered 2019-02-02 – 2019-02-03 (×6): 25 ug via INTRAVENOUS
  Filled 2019-02-02: qty 25

## 2019-02-02 NOTE — Procedures (Addendum)
Central Venous Catheter Insertion Procedure Note TESSLYN BAUMERT [id number] 1945-09-28  Procedure: Insertion of Central Venous Catheter Indications: Assessment of intravascular volume, Drug and/or fluid administration and Frequent blood sampling  Date: 02/01/19  Procedure Details Consent: Risks of procedure as well as the alternatives and risks of each were explained to the (patient/caregiver).  Consent for procedure obtained. Time Out: Verified patient identification, verified procedure, site/side was marked, verified correct patient position, special equipment/implants available, medications/allergies/relevent history reviewed, required imaging and test results available.  Performed  Maximum sterile technique was used including antiseptics, cap, gloves, gown, hand hygiene, mask and sheet. Skin prep: Chlorhexidine; local anesthetic administered A antimicrobial bonded/coated triple lumen catheter was placed in the left internal jugular vein using the Seldinger technique.  Evaluation Blood flow good Complications: No apparent complications Patient did tolerate procedure well. Chest X-ray ordered to verify placement.  CXR: L IJ CVC appropriate.  Doll Frazee Rodman Pickle 02/02/2019, 10:06 AM

## 2019-02-02 NOTE — Progress Notes (Signed)
  Echocardiogram 2D Echocardiogram has been performed.  Marybelle Killings 02/02/2019, 4:54 PM

## 2019-02-02 NOTE — Procedures (Addendum)
Intubation Procedure Note [name] [id number] 1946-03-31  Procedure: Intubation Indications: Respiratory insufficiency  Date: 02/01/19  Procedure Details Consent: Risks of procedure as well as the alternatives and risks of each were explained to the (patient/caregiver).  Consent for procedure obtained. Time Out: Verified patient identification, verified procedure, site/side was marked, verified correct patient position, special equipment/implants available, medications/allergies/relevent history reviewed, required imaging and test results available.  Performed  Maximum sterile technique was used including antiseptics, cap, gloves, gown, hand hygiene and mask.  MAC and 3 with glidescope  Medications administered: Fentanyl 200 mcg, Versed 2 mg, Etomidate 20 mg  Evaluation Hemodynamic Status: BP stable throughout; O2 sats: stable throughout Patient's Current Condition: stable Complications: No apparent complications Patient did tolerate procedure well. Chest X-ray ordered to verify placement.  CXR: tube position low-repostitioned.    [name] 02/02/2019

## 2019-02-02 NOTE — Progress Notes (Signed)
Notified Elink MD about critical troponin, 2.83. Will continue to monitor,

## 2019-02-02 NOTE — Progress Notes (Signed)
Unable to obtain to thread arterial line. Attempted x2 and another RT attempted x2. Pt very agitated even after sedation given. RN aware.

## 2019-02-02 NOTE — Progress Notes (Signed)
[facility] Progress Note Patient Name: [name] DOB: [birth year] MRN: [id number]   Date of Service  02/02/2019  HPI/Events of Note  Notified of relatively low BP with low UO and CVP 7 on positive pressure ventilation. Positive 3 L since admit. EF normal on echo.  eICU Interventions   Will give a trial of 500 cc NS bolus  Hold irbesartan     Intervention Category Major Interventions: Hypotension - evaluation and management Intermediate Interventions: Oliguria - evaluation and management  Judd Lien 02/02/2019, 10:32 PM
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
## 2019-02-02 NOTE — Progress Notes (Signed)
CRITICAL VALUE ALERT  Critical Value:  Troponin 1.36  Date & Time Notied:  02/02/19 2345  Provider Notified: Warren Lacy  Orders Received/Actions taken: lab trending down

## 2019-02-02 NOTE — Progress Notes (Signed)
RN noticed that sputum sample was ordered yesterday evening and had not been collected.  Sample was collected and walked down to lab w/ label and requisition, given to lab employee.

## 2019-02-02 NOTE — Progress Notes (Addendum)
EKG reviewed > question a new incomplete RBBB when compared to prior EKG.  Pending Cardiology evaluation.   Hold eliquis and transition to heparin gtt per pharmacy in the event she needs intervention / procedures of any kind.   Noe Gens, NP-C [facility] Pgr: [phone] or if no answer [phone] 02/02/2019, 10:54 AM

## 2019-02-02 NOTE — Progress Notes (Addendum)
NAME:  [name], MRN:  [id number], DOB:  [birth year], LOS: 0 ADMISSION DATE:  02/01/2019, CONSULTATION DATE:  02/01/19 REFERRING MD:  Fuller Plan CHIEF COMPLAINT:  Hypoxemia  Brief History   73 year old female, former smoker, admitted 6/13 with N/V/D and profound electrolyte disturbances.  She was medicated for pain and anxiety.  Later developed AMS and required transfer to ICU. PCCM consulted for acute onset of hypoxemia secondary suspected aspiration.  History of present illness   73 year old female with past medical history below who initially presented with nausea, vomiting and diarrhea and found with profound hypokalemia and hypomagnesemia.  During her hospital course electrolytes were repleted.  Overnight patient was given Ativan and was found to have altered mental statu.  PCCM consulted for hypoxemia.  Past Medical History  Hypertension hyperlipidemia, paraoxysmal atrial fibrillation on Eliquis, CVA, hypothyroidism, COPD, depression, anemia, arthritis, chronic opioid use  Significant Hospital Events   6/13 Admit  6/13 Transferred to ICU  Consults:  PCCM  Procedures:  ETT 6/13 >> L IJ CVC 6/13 >>  Significant Diagnostic Tests:  UDS 6/13 >> positive for opiates  Micro Data:  COVID 6/13 >> negative  Trach aspirate 6/13 >> BCx2 6/13 >> negative  COVID (send out) 6/13 >>   Antimicrobials:  Ceftriaxone 6/13 >>  Interim history/subjective:  RN reports pt with AF, rate up to 140.  Intermittent agitation - given versed x1.  Decreased UOP per RN.  Afebrile.  I/O - 1.8L positive in last 24 hours / 500 UOP   Objective   Blood pressure 100/76, pulse (!) 126, temperature (!) 97.1 F (36.2 C), temperature source Axillary, resp. rate 16, weight 53.2 kg, SpO2 100 %.    Vent Mode: PRVC FiO2 (%):  [50 %-100 %] 50 % Set Rate:  [16 bmp] 16 bmp Vt Set:  [500 mL] 500 mL PEEP:  [5 cmH20] 5 cmH20 Plateau Pressure:  [16 cmH20-18 cmH20] 16 cmH20   Intake/Output Summary (Last 24  hours) at 02/02/2019 0858 Last data filed at 02/02/2019 0800 Gross per 24 hour  Intake 2396.55 ml  Output 500 ml  Net 1896.55 ml   Filed Weights   02/01/19 1948  Weight: 53.2 kg    Physical Exam: - Electronic / door exam performed / physical exam per Attending MD General:  Thin elderly female lying in bed on vent in NAD HEENT: MM pink/moist, ETT Neuro: eyes closed post versed administration CV: s1s2 rrr, no m/r/g PULM: even/non-labored on vent, breathing set rate / Vt. Peak pressure 19 GI: flat, non-distended Extremities: pink / no edema when RT pulled covers back to assess LE's  Skin: no rashes or lesions on exposed skin  Resolved Hospital Problem list     Assessment & Plan:   Acute Hypoxemic Respiratory Failure -suspect secondary to aspiration  Aspiration Event  Former Smoker  P: PRVC 8cc/kg - Vt adjusted for measured height  Rate 16  Wean PEEP / FiO2 for sats 88-95% Follow tracheal aspirate  ABX as above, dose adjusted to 2gm Follow intermittent CXR  May need to adjust ETT based on last images  Daily SBT / WUA  Follow up repeat COVID testing > doubt based on isolation hx and first COVID negative   Acute Toxic Metabolic Encephalopathy -secondary to oversedation Chronic Pain / Long Term Narcotic Use Depression P: Fentanyl gtt, PRN versed for sedation   RASS GOAL: 0 to -1  Minimize sedation as able  Will need PT/OT once extubated   Chronic AF with  RVR -on eliquis, cardizem at baseline -followed by Dr. Martinique HLD Hx HTN  P: 552ml NS bolus x1 Change XR cardizem to suspension May need to transition to IV cardizem Continue eliquis   Troponin Leak  -suspect demand ischemia in setting of respiratory distress, AFwRVR Borderline BP  P: Trend troponin  Assess EKG  Cardiology consulted, case discussed with Dr. 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 Rise in Sr Cr - 0.8 to 1.12, low  muscle mass  P: Trend BMP / urinary output Replace electrolytes as indicated Avoid nephrotoxic agents, ensure adequate renal perfusion NS at 50 ml/hr   Chronic GI Disturbances - Diarrhea, Poor Intake, N/V At Risk Malnutrition  GERD Hiatal Hernia  Schatzki's Ring P: NPO  Begin TF for nutrition  Monitor electrolytes for refeeding syndrome  Prostat   Hypothyroid P: Continue synthroid    Best practice:  Diet: NPO / TF  Pain/Anxiety/Delirium protocol (if indicated): Fentanyl / versed  VAP protocol (if indicated): in place  DVT prophylaxis: Eliquis GI prophylaxis: PPI Glucose control: n/a Mobility: BR Code Status: Full Family Communication: Patient's daughter Steva Ready her son-in-law Dr. Arvid Right.  Care discussed with Dr. Cyndia Bent.  Will have Attending MD call for update as well.  Disposition: ICU  Labs   CBC: Recent Labs  Lab 02/01/19 0850 02/01/19 2044 02/02/19 0538  WBC 10.6*  --  18.0*  NEUTROABS 9.0*  --   --   HGB 12.3 13.6 13.4  HCT 36.9 40.0 39.7  MCV 94.1  --  94.7  PLT 277  --  034    Basic Metabolic Panel: Recent Labs  Lab 02/01/19 0850 02/01/19 2044 02/02/19 0538  NA 139 136 137  K 2.7* 2.7* 5.1  CL 100  --  107  CO2 26  --  23  GLUCOSE 164*  --  75  BUN 11  --  16  CREATININE 0.84  --  1.12*  CALCIUM 9.4  --  9.3  MG 1.3*  --  2.1   GFR: Estimated Creatinine Clearance: 37.6 mL/min (A) (by C-G formula based on SCr of 1.12 mg/dL (H)). Recent Labs  Lab 02/01/19 0850 02/02/19 0538  WBC 10.6* 18.0*    Liver Function Tests: Recent Labs  Lab 02/01/19 0850  AST 49*  ALT 23  ALKPHOS 91  BILITOT 1.5*  PROT 6.4*  ALBUMIN 3.9   No results for input(s): LIPASE, AMYLASE in the last 168 hours. No results for input(s): AMMONIA in the last 168 hours.  ABG    Component Value Date/Time   PHART 7.401 02/01/2019 2044   PCO2ART 32.2 02/01/2019 2044   PO2ART 244.0 (H) 02/01/2019 2044   HCO3 20.1 02/01/2019 2044   TCO2 21 (L) 02/01/2019  2044   ACIDBASEDEF 4.0 (H) 02/01/2019 2044   O2SAT 100.0 02/01/2019 2044     Coagulation Profile: No results for input(s): INR, PROTIME in the last 168 hours.  Cardiac Enzymes: Recent Labs  Lab 02/02/19 0157 02/02/19 0538  TROPONINI 2.83* 3.63*    HbA1C: No results found for: HGBA1C  CBG: Recent Labs  Lab 02/01/19 1603 02/01/19 2005 02/01/19 2347 02/02/19 0345 02/02/19 0811  GLUCAP 178* 239* 139* 79 62*     Critical care time: 30 minutes    Noe Gens, NP-C [facility] Pgr: [phone] or if no answer [phone] 02/02/2019, 8:58 AM  Attending Note:  73 year old female with chronic narcotic  use presenting with an aspirating pneumonia and respiratory failure with septic shock.  Overnight, no events.  I reviewed CXR myself, infiltrate noted and CXR is in place.  Discussed with PCCM-NP.  Awaiting lab core testing for COVID but in house test is negative.  Will hold off weaning today.  IVF resuscitation.  EKG with ?RBBB, Dr. Martinique from cardiology to assess patient.  Pressors for BP support.  BMET in AM.  Replace electrolytes as indicated.  Dr. Cyndia Bent updated over the phone.  PCCM will continue to manage.  The patient is critically ill with multiple organ systems failure and requires high complexity decision making for assessment and support, frequent evaluation and titration of therapies, application of advanced monitoring technologies and extensive interpretation of multiple databases.   Critical Care Time devoted to patient care services described in this note is  34  Minutes. This time reflects time of care of this signee Dr Jennet Maduro. This critical care time does not reflect procedure time, or teaching time or supervisory time of PA/NP/Med student/Med Resident etc but could involve care discussion time.  Rush Farmer, M.D. [facility]Critical Care Medicine. Pager: [phone]. After hours pager: [phone].
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
## 2019-02-02 NOTE — Progress Notes (Signed)
Spoke w/ pts dtr to provide updates. 

## 2019-02-02 NOTE — Progress Notes (Signed)
Notified Elink MD about pt heart rate in afib 120-140s. No new orders. Will continue to monitor.

## 2019-02-03 ENCOUNTER — Inpatient Hospital Stay (HOSPITAL_COMMUNITY): Payer: Medicare Other

## 2019-02-03 LAB — RESPIRATORY PANEL BY PCR

## 2019-02-03 LAB — APTT
aPTT: 52 seconds — ABNORMAL HIGH (ref 24–36)
aPTT: 55 seconds — ABNORMAL HIGH (ref 24–36)

## 2019-02-03 LAB — GLUCOSE, CAPILLARY
Glucose-Capillary: 101 mg/dL — ABNORMAL HIGH (ref 70–99)
Glucose-Capillary: 113 mg/dL — ABNORMAL HIGH (ref 70–99)
Glucose-Capillary: 119 mg/dL — ABNORMAL HIGH (ref 70–99)
Glucose-Capillary: 79 mg/dL (ref 70–99)
Glucose-Capillary: 91 mg/dL (ref 70–99)
Glucose-Capillary: 91 mg/dL (ref 70–99)
Glucose-Capillary: 92 mg/dL (ref 70–99)

## 2019-02-03 LAB — NOVEL CORONAVIRUS, NAA (HOSP ORDER, SEND-OUT TO REF LAB; TAT 18-24 HRS): SARS-CoV-2, NAA: NOT DETECTED

## 2019-02-03 LAB — BASIC METABOLIC PANEL
Anion gap: 5 (ref 5–15)
BUN: 17 mg/dL (ref 8–23)
CO2: 24 mmol/L (ref 22–32)
Calcium: 8.6 mg/dL — ABNORMAL LOW (ref 8.9–10.3)
Chloride: 112 mmol/L — ABNORMAL HIGH (ref 98–111)
Creatinine, Ser: 0.98 mg/dL (ref 0.44–1.00)
GFR calc Af Amer: >60 mL/min (ref >60)
GFR calc non Af Amer: 57 mL/min — ABNORMAL LOW (ref >60)
Glucose, Bld: 100 mg/dL — ABNORMAL HIGH (ref 70–99)
Potassium: 3.8 mmol/L (ref 3.5–5.1)
Sodium: 141 mmol/L (ref 135–145)

## 2019-02-03 LAB — CBC
HCT: 36 % (ref 36.0–46.0)
Hemoglobin: 11.7 g/dL — ABNORMAL LOW (ref 12.0–15.0)
MCH: 31.9 pg (ref 26.0–34.0)
MCHC: 32.5 g/dL (ref 30.0–36.0)
MCV: 98.1 fL (ref 80.0–100.0)
Platelets: 265 10*3/uL (ref 150–400)
RBC: 3.67 MIL/uL — ABNORMAL LOW (ref 3.87–5.11)
RDW: 13 % (ref 11.5–15.5)
WBC: 17.3 10*3/uL — ABNORMAL HIGH (ref 4.0–10.5)
nRBC: 0.2 % (ref 0.0–0.2)

## 2019-02-03 LAB — PHOSPHORUS: Phosphorus: 2 mg/dL — ABNORMAL LOW (ref 2.5–4.6)

## 2019-02-03 LAB — MAGNESIUM: Magnesium: 1.8 mg/dL (ref 1.7–2.4)

## 2019-02-03 LAB — STREP PNEUMONIAE URINARY ANTIGEN: Strep Pneumo Urinary Antigen: NEGATIVE

## 2019-02-03 MED ORDER — POTASSIUM CHLORIDE 20 MEQ/15ML (10%) PO SOLN
20.0000 meq | Freq: Once | ORAL | Status: AC
  Administered 2019-02-03: 20 meq
  Filled 2019-02-03: qty 15

## 2019-02-03 MED ORDER — MAGNESIUM SULFATE 2 GM/50ML IV SOLN: 2.0000 g | Freq: Once | INTRAVENOUS | Status: DC

## 2019-02-03 MED ORDER — PHENYLEPHRINE HCL-NACL 10-0.9 MG/250ML-% IV SOLN
0.0000 ug/min | INTRAVENOUS | Status: DC
  Administered 2019-02-03 (×2): 100 ug/min via INTRAVENOUS
  Administered 2019-02-03: 20 ug/min via INTRAVENOUS
  Administered 2019-02-03: 100 ug/min via INTRAVENOUS
  Filled 2019-02-03 (×5): qty 250

## 2019-02-03 MED ORDER — SODIUM CHLORIDE 0.9 % IV SOLN
2.0000 g | Freq: Two times a day (BID) | INTRAVENOUS | Status: DC
  Administered 2019-02-03 – 2019-02-04 (×3): 2 g via INTRAVENOUS
  Filled 2019-02-03 (×3): qty 2

## 2019-02-03 MED ORDER — FUROSEMIDE 10 MG/ML IJ SOLN
20.0000 mg | Freq: Once | INTRAMUSCULAR | Status: AC
  Administered 2019-02-03: 20 mg via INTRAVENOUS
  Filled 2019-02-03: qty 2

## 2019-02-03 MED ORDER — POTASSIUM PHOSPHATES 15 MMOLE/5ML IV SOLN
10.0000 mmol | Freq: Once | INTRAVENOUS | Status: AC
  Administered 2019-02-03: 10 mmol via INTRAVENOUS
  Filled 2019-02-03: qty 3.33

## 2019-02-03 MED ORDER — PHENYLEPHRINE HCL-NACL 40-0.9 MG/250ML-% IV SOLN
0.0000 ug/min | INTRAVENOUS | Status: DC
  Administered 2019-02-03: 90 ug/min via INTRAVENOUS
  Administered 2019-02-03 (×2): 40 ug/min via INTRAVENOUS
  Filled 2019-02-03 (×2): qty 250

## 2019-02-03 MED ORDER — HEPARIN BOLUS VIA INFUSION
800.0000 [IU] | Freq: Once | INTRAVENOUS | Status: AC
  Administered 2019-02-03: 800 [IU] via INTRAVENOUS
  Filled 2019-02-03: qty 800

## 2019-02-03 MED ORDER — VITAL AF 1.2 CAL PO LIQD
1000.0000 mL | ORAL | Status: DC
  Administered 2019-02-03: 1000 mL

## 2019-02-03 MED ORDER — NOREPINEPHRINE 16 MG/250ML-% IV SOLN: 0.0000 ug/min | INTRAVENOUS | Status: DC

## 2019-02-03 MED ORDER — MAGNESIUM SULFATE 2 GM/50ML IV SOLN
2.0000 g | Freq: Once | INTRAVENOUS | Status: AC
  Administered 2019-02-03: 2 g via INTRAVENOUS
  Filled 2019-02-03: qty 50

## 2019-02-03 NOTE — Progress Notes (Signed)
Initial Nutrition Assessment  DOCUMENTATION CODES:   Not applicable  INTERVENTION:    Vital AF 1.2 at 20 ml/h, increase by 10 ml every 4 hours to goal rate of 50 ml/h (1200 ml per day)   Provides 1440 kcal, 90 gm protein, 973 ml free water daily   Continue to monitor and replace electrolytes as needed  NUTRITION DIAGNOSIS:   Inadequate oral intake related to inability to eat as evidenced by NPO status.  GOAL:   Patient will meet greater than or equal to 90% of their needs  MONITOR:   Vent status, TF tolerance, Labs, I & O's  REASON FOR ASSESSMENT:   Ventilator, Consult Enteral/tube feeding initiation and management  ASSESSMENT:   73 yo female admitted with N/V/D with profound hypokalemia & hypomagnesemia. Developed AMS and required intubation on 6/13. PMH of HTN, HLD, PAF, CVA, COPD, anemia, chronic opioid use.  Received MD Consult for TF initiation and management. OG tube in place.  Patient is currently intubated on ventilator support MV: 11.2 L/min Temp (24hrs), Avg:98.4 F (36.9 C), Min:97.6 F (36.4 C), Max:99.4 F (37.4 C)   Labs reviewed. Potassium 3.8 WNL, phosphorus 2 (L), Magnesium 1.8 WNL CBG's: 79-91-91  Medications reviewed and include Novolog, Mag sulfate, Levophed, Neosynephrine.   NUTRITION - FOCUSED PHYSICAL EXAM:  deferred  Diet Order:   Diet Order    None      EDUCATION NEEDS:   No education needs have been identified at this time  Skin:  Skin Assessment: Reviewed RN Assessment  Last BM:  6/14 (type 6)  Height:   Ht Readings from Last 1 Encounters:  02/02/19 5\' 5"  (1.651 m)    Weight:   Wt Readings from Last 1 Encounters:  02/03/19 55.4 kg    Ideal Body Weight:  56.8 kg  BMI:  Body mass index is 20.32 kg/m.  Estimated Nutritional Needs:   Kcal:  1400  Protein:  80-95 gm  Fluid:  >/= 1.5 L    Molli Barrows, RD, LDN, [place] Pager [phone] After Hours Pager [phone]

## 2019-02-03 NOTE — Progress Notes (Signed)
Fentanyl has been titrated down to 75/hr.  Pt still very restless/agitated.  Unable to titrate down further.  RT has attempted weaning on vent w/out success.  Spoke w/ Dr. Chase Caller.re: switching sedation meds in order to successfully wean on vent.  Plan to leave on fentanyl for tonight and reassess tomorrow after speaking w/ family.

## 2019-02-03 NOTE — Progress Notes (Signed)
[facility] Progress Note Patient Name: [name] DOB: 08/31/1945 MRN: [id number]   Date of Service  02/03/2019  HPI/Events of Note  Patient remains hypotensive despite fluid bolus but with improved urine output  eICU Interventions  Will hold cardizem as well Start neosynephrine if needed     Intervention Category Major Interventions: Hypotension - evaluation and management  Judd Lien 02/03/2019, 12:05 AM

## 2019-02-03 NOTE — Progress Notes (Signed)
Notified Warren Lacy MD about pt COVID send out test results.   Negative.

## 2019-02-03 NOTE — Progress Notes (Signed)
RT note: attempted SBT on patient this AM however patient respiratory rate began to increase and heart rate also began to increase.  Placed patient back on full support ventilator settings and is currently tolerating well.  Will continue to monitor.
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
## 2019-02-04 ENCOUNTER — Inpatient Hospital Stay (HOSPITAL_COMMUNITY): Payer: Medicare Other

## 2019-02-04 DIAGNOSIS — I429 Cardiomyopathy, unspecified: Secondary | ICD-10-CM

## 2019-02-04 DIAGNOSIS — J90 Pleural effusion, not elsewhere classified: Secondary | ICD-10-CM

## 2019-02-04 LAB — MAGNESIUM: Magnesium: 1.9 mg/dL (ref 1.7–2.4)

## 2019-02-04 LAB — HEPATIC FUNCTION PANEL
ALT: 231 U/L — ABNORMAL HIGH (ref 0–44)
AST: 123 U/L — ABNORMAL HIGH (ref 15–41)
Albumin: 2.4 g/dL — ABNORMAL LOW (ref 3.5–5.0)
Alkaline Phosphatase: 59 U/L (ref 38–126)
Bilirubin, Direct: <0.1 mg/dL (ref 0.0–0.2)
Total Bilirubin: 0.6 mg/dL (ref 0.3–1.2)
Total Protein: 4.9 g/dL — ABNORMAL LOW (ref 6.5–8.1)

## 2019-02-04 LAB — CBC WITH DIFFERENTIAL/PLATELET
Abs Immature Granulocytes: 0.08 10*3/uL — ABNORMAL HIGH (ref 0.00–0.07)
Basophils Absolute: 0 10*3/uL (ref 0.0–0.1)
Basophils Relative: 0 %
Eosinophils Absolute: 0 10*3/uL (ref 0.0–0.5)
Eosinophils Relative: 0 %
HCT: 36.2 % (ref 36.0–46.0)
Hemoglobin: 11.6 g/dL — ABNORMAL LOW (ref 12.0–15.0)
Immature Granulocytes: 1 %
Lymphocytes Relative: 22 %
Lymphs Abs: 2.5 10*3/uL (ref 0.7–4.0)
MCH: 32 pg (ref 26.0–34.0)
MCHC: 32 g/dL (ref 30.0–36.0)
MCV: 100 fL (ref 80.0–100.0)
Monocytes Absolute: 0.9 10*3/uL (ref 0.1–1.0)
Monocytes Relative: 8 %
Neutro Abs: 8 10*3/uL — ABNORMAL HIGH (ref 1.7–7.7)
Neutrophils Relative %: 69 %
Platelets: 224 10*3/uL (ref 150–400)
RBC: 3.62 MIL/uL — ABNORMAL LOW (ref 3.87–5.11)
RDW: 13.1 % (ref 11.5–15.5)
WBC: 11.6 10*3/uL — ABNORMAL HIGH (ref 4.0–10.5)
nRBC: 1 % — ABNORMAL HIGH (ref 0.0–0.2)

## 2019-02-04 LAB — BODY FLUID CELL COUNT WITH DIFFERENTIAL
Lymphs, Fluid: 4 %
Monocyte-Macrophage-Serous Fluid: 21 % — ABNORMAL LOW (ref 50–90)
Neutrophil Count, Fluid: 75 % — ABNORMAL HIGH (ref 0–25)
Total Nucleated Cell Count, Fluid: 600 cu mm (ref 0–1000)

## 2019-02-04 LAB — BASIC METABOLIC PANEL
Anion gap: 6 (ref 5–15)
BUN: 20 mg/dL (ref 8–23)
CO2: 25 mmol/L (ref 22–32)
Calcium: 8.5 mg/dL — ABNORMAL LOW (ref 8.9–10.3)
Chloride: 111 mmol/L (ref 98–111)
Creatinine, Ser: 0.82 mg/dL (ref 0.44–1.00)
GFR calc Af Amer: >60 mL/min (ref >60)
GFR calc non Af Amer: >60 mL/min (ref >60)
Glucose, Bld: 117 mg/dL — ABNORMAL HIGH (ref 70–99)
Potassium: 3.8 mmol/L (ref 3.5–5.1)
Sodium: 142 mmol/L (ref 135–145)

## 2019-02-04 LAB — GLUCOSE, CAPILLARY
Glucose-Capillary: 131 mg/dL — ABNORMAL HIGH (ref 70–99)
Glucose-Capillary: 113 mg/dL — ABNORMAL HIGH (ref 70–99)
Glucose-Capillary: 92 mg/dL (ref 70–99)
Glucose-Capillary: 98 mg/dL (ref 70–99)
Glucose-Capillary: 86 mg/dL (ref 70–99)
Glucose-Capillary: 96 mg/dL (ref 70–99)

## 2019-02-04 LAB — LACTIC ACID, PLASMA: Lactic Acid, Venous: 1.2 mmol/L (ref 0.5–1.9)

## 2019-02-04 LAB — ECHOCARDIOGRAM COMPLETE
Height: 65 in
Weight: 1876.56 oz

## 2019-02-04 LAB — LEGIONELLA PNEUMOPHILA SEROGP 1 UR AG: L. pneumophila Serogp 1 Ur Ag: NEGATIVE

## 2019-02-04 LAB — APTT
aPTT: 72 seconds — ABNORMAL HIGH (ref 24–36)
aPTT: 65 seconds — ABNORMAL HIGH (ref 24–36)

## 2019-02-04 LAB — HEPARIN LEVEL (UNFRACTIONATED): Heparin Unfractionated: 0.71 IU/mL — ABNORMAL HIGH (ref 0.30–0.70)

## 2019-02-04 LAB — LACTATE DEHYDROGENASE, PLEURAL OR PERITONEAL FLUID: LD, Fluid: 128 U/L — ABNORMAL HIGH (ref 3–23)

## 2019-02-04 LAB — COOXEMETRY PANEL
Carboxyhemoglobin: 1.1 % (ref 0.5–1.5)
Methemoglobin: 0.9 % (ref 0.0–1.5)
O2 Saturation: 55.5 %
Total hemoglobin: 11.7 g/dL — ABNORMAL LOW (ref 12.0–16.0)

## 2019-02-04 LAB — PROTEIN, PLEURAL OR PERITONEAL FLUID: Total protein, fluid: <3 g/dL

## 2019-02-04 LAB — AMYLASE: Amylase: 13 U/L — ABNORMAL LOW (ref 28–100)

## 2019-02-04 LAB — PHOSPHORUS: Phosphorus: 1.6 mg/dL — ABNORMAL LOW (ref 2.5–4.6)

## 2019-02-04 LAB — PROTIME-INR
INR: 1.3 — ABNORMAL HIGH (ref 0.8–1.2)
Prothrombin Time: 16.3 seconds — ABNORMAL HIGH (ref 11.4–15.2)

## 2019-02-04 LAB — LIPASE, BLOOD: Lipase: 23 U/L (ref 11–51)

## 2019-02-04 MED ORDER — FENTANYL CITRATE (PF) 100 MCG/2ML IJ SOLN
12.5000 ug | Freq: Once | INTRAMUSCULAR | Status: AC
  Administered 2019-02-05: 12.5 ug via INTRAVENOUS
  Filled 2019-02-04: qty 2

## 2019-02-04 MED ORDER — HALOPERIDOL LACTATE 5 MG/ML IJ SOLN
5.0000 mg | Freq: Once | INTRAMUSCULAR | Status: AC
  Administered 2019-02-04: 5 mg via INTRAVENOUS
  Filled 2019-02-04: qty 1

## 2019-02-04 MED ORDER — DEXMEDETOMIDINE HCL IN NACL 400 MCG/100ML IV SOLN
0.4000 ug/kg/h | INTRAVENOUS | Status: DC
  Administered 2019-02-04 – 2019-02-05 (×2): 0.4 ug/kg/h via INTRAVENOUS
  Administered 2019-02-05: 0.9 ug/kg/h via INTRAVENOUS
  Administered 2019-02-05: 0.8 ug/kg/h via INTRAVENOUS
  Administered 2019-02-06: 1.1 ug/kg/h via INTRAVENOUS
  Filled 2019-02-04 (×4): qty 100

## 2019-02-04 MED ORDER — ORAL CARE MOUTH RINSE
15.0000 mL | Freq: Two times a day (BID) | OROMUCOSAL | Status: DC
  Administered 2019-02-04 – 2019-02-13 (×16): 15 mL via OROMUCOSAL

## 2019-02-04 MED ORDER — SPIRONOLACTONE 12.5 MG HALF TABLET
12.5000 mg | ORAL_TABLET | Freq: Every day | ORAL | Status: DC
  Administered 2019-02-05 – 2019-02-13 (×7): 12.5 mg via ORAL
  Filled 2019-02-04 (×10): qty 1

## 2019-02-04 MED ORDER — DIGOXIN 125 MCG PO TABS
0.1250 mg | ORAL_TABLET | Freq: Every day | ORAL | Status: DC
  Administered 2019-02-05 – 2019-02-09 (×5): 0.125 mg via ORAL
  Filled 2019-02-04 (×6): qty 1

## 2019-02-04 MED ORDER — FUROSEMIDE 40 MG PO TABS
40.0000 mg | ORAL_TABLET | Freq: Every day | ORAL | Status: DC
  Administered 2019-02-05: 40 mg via ORAL
  Filled 2019-02-04: qty 1

## 2019-02-04 MED ORDER — POTASSIUM PHOSPHATES 15 MMOLE/5ML IV SOLN
30.0000 mmol | Freq: Once | INTRAVENOUS | Status: AC
  Administered 2019-02-04: 30 mmol via INTRAVENOUS
  Filled 2019-02-04: qty 10

## 2019-02-04 MED ORDER — FENTANYL 25 MCG/HR TD PT72
1.0000 | MEDICATED_PATCH | TRANSDERMAL | Status: DC
  Administered 2019-02-04 – 2019-02-10 (×3): 1 via TRANSDERMAL
  Filled 2019-02-04 (×3): qty 1

## 2019-02-04 MED ORDER — HALOPERIDOL LACTATE 5 MG/ML IJ SOLN
2.0000 mg | Freq: Four times a day (QID) | INTRAMUSCULAR | Status: DC | PRN
  Administered 2019-02-04 – 2019-02-06 (×3): 2 mg via INTRAVENOUS
  Filled 2019-02-04 (×3): qty 1

## 2019-02-04 MED ORDER — FUROSEMIDE 10 MG/ML IJ SOLN
20.0000 mg | Freq: Once | INTRAMUSCULAR | Status: AC
  Administered 2019-02-04: 20 mg via INTRAVENOUS
  Filled 2019-02-04: qty 2

## 2019-02-04 MED ORDER — VANCOMYCIN HCL 10 G IV SOLR
1250.0000 mg | INTRAVENOUS | Status: DC
  Administered 2019-02-04: 1250 mg via INTRAVENOUS
  Filled 2019-02-04 (×2): qty 1250

## 2019-02-04 NOTE — Progress Notes (Signed)
Assisted tele visit to patient with daughter.  Adira Limburg Ann, RN  

## 2019-02-04 NOTE — Progress Notes (Signed)
NAME:  [name], MRN:  [id number], DOB:  23-Mar-1946, LOS: 2 ADMISSION DATE:  02/01/2019, CONSULTATION DATE:  02/01/19 REFERRING MD:  Fuller Plan CHIEF COMPLAINT:  Hypoxemia  Brief History    73 year old female with past medical history below who initially presented with nausea, vomiting and diarrhea and found with profound hypokalemia and hypomagnesemia.  During her hospital course electrolytes were repleted.  Overnight patient was given Ativan and was found to have altered mental statu.  PCCM consulted for hypoxemia.  Past Medical History  Hypertension hyperlipidemia, paraoxysmal atrial fibrillation on Eliquis, CVA, hypothyroidism, COPD, depression, anemia, arthritis, chronic opioid use  Significant Hospital Events   6/13 Admit  6/13 Transferred to ICU 6/14 - RN reports pt with AF, rate up to 140.  Intermittent agitation - given versed x1.  Decreased UOP per RN.  Afebrile.  I/O - 1.8L positive in last 24 hours / 500 UOP   Consults:  PCCM  Procedures:  ETT 6/13 >> 02/04/2019 L IJ CVC 6/13 >>  Significant Diagnostic Tests:  UDS 6/13 >> positive for opiates  Micro Data:  COVID 6/13 >> negative  Trach aspirate 6/13 >> staph aureus>> BCx2 6/13 >> negative  COVID (send out) 6/13 >>  negative  Antimicrobials:  Ceftriaxone 6/13 >> 02/03/2019 02/03/2019 cefepime>> 02/04/2019 vancomycin per pharmacy>>  Interim history/subjective:    02/04/2019 - 40 % Fio3, On neo gtt and fent gtt. HR 100. Following commands. Per RN  - got agitated yesterday during sedation wean.  CXR wet with infiltrates on UL  Objective   Blood pressure 112/80, pulse 91, temperature 98 F (36.7 C), temperature source Axillary, resp. rate 16, height 5\' 5"  (1.651 m), weight 52 kg, SpO2 100 %. CVP:  [8 mmHg-9 mmHg] 8 mmHg  Vent Mode: PRVC FiO2 (%):  [40 %] 40 % Set Rate:  [16 bmp] 16 bmp Vt Set:  [450 mL] 450 mL PEEP:  [5 cmH20] 5 cmH20 Plateau Pressure:  [13 cmH20-16 cmH20] 15 cmH20   Intake/Output  Summary (Last 24 hours) at 02/04/2019 0908 Last data filed at 02/04/2019 0800 Gross per 24 hour  Intake 2680.27 ml  Output 1651 ml  Net 1029.27 ml   Filed Weights   02/01/19 1948 02/03/19 0423 02/04/19 0500  Weight: 53.2 kg 55.4 kg 52 kg    General:   HEENT: Extubated Neuro: Follows commands moves all extremities CV: Atrial fibrillation with controlled ventricular response PULM: Mild rhonchi YS:AYTK, non-tender, bsx4 active  Extremities: warm/dry, 1+ edema  Skin: no rashes or lesions      LABS    PULMONARY Recent Labs  Lab 02/01/19 1530 02/01/19 2044  PHART 7.469* 7.401  PCO2ART 34.1 32.2  PO2ART 48.7* 244.0*  HCO3 24.5 20.1  TCO2  --  21*  O2SAT 82.6 100.0    CBC Recent Labs  Lab 02/02/19 0538 02/03/19 0412 02/04/19 0426  HGB 13.4 11.7* 11.6*  HCT 39.7 36.0 36.2  WBC 18.0* 17.3* 11.6*  PLT 306 265 224    COAGULATION Recent Labs  Lab 02/04/19 0426  INR 1.3*    CARDIAC   Recent Labs  Lab 02/02/19 0157 02/02/19 0538 02/02/19 1027 02/02/19 1719 02/02/19 2152  TROPONINI 2.83* 3.63* 2.97* 1.65* 1.36*   No results for input(s): PROBNP in the last 168 hours.   CHEMISTRY Recent Labs  Lab 02/01/19 0850 02/01/19 2044 02/02/19 0538 02/03/19 0412 02/04/19 0045 02/04/19 0426  NA 139 136 137 141 142  --   K 2.7* 2.7* 5.1 3.8 3.8  --  CL 100  --  107 112* 111  --   CO2 26  --  23 24 25   --   GLUCOSE 164*  --  75 100* 117*  --   BUN 11  --  16 17 20   --   CREATININE 0.84  --  1.12* 0.98 0.82  --   CALCIUM 9.4  --  9.3 8.6* 8.5*  --   MG 1.3*  --  2.1 1.8  --  1.9  PHOS  --   --   --  2.0*  --  1.6*   Estimated Creatinine Clearance: 50.2 mL/min (by C-G formula based on SCr of 0.82 mg/dL).   LIVER Recent Labs  Lab 02/01/19 0850 02/04/19 0426  AST 49* 123*  ALT 23 231*  ALKPHOS 91 59  BILITOT 1.5* 0.6  PROT 6.4* 4.9*  ALBUMIN 3.9 2.4*  INR  --  1.3*     INFECTIOUS Recent Labs  Lab 02/04/19 0045  LATICACIDVEN 1.2      ENDOCRINE CBG (last 3)  Recent Labs    02/03/19 2334 02/04/19 0410 02/04/19 0731  GLUCAP 113* 131* 113*         IMAGING x48h  - image(s) personally visualized  -   highlighted in bold Dg Chest Port 1 View  Result Date: 02/04/2019 CLINICAL DATA:  73 year old female with a history of endotracheal tube EXAM: PORTABLE CHEST 1 VIEW COMPARISON:  02/03/2019 02/02/2019, 02/01/2019 FINDINGS: Cardiomediastinal silhouette unchanged in size and contour. Unchanged endotracheal tube terminating just above the carina 2.8 cm. Unchanged gastric tube terminating below the diaphragm out of the field of view. Unchanged left IJ central venous catheter with the tip appearing to terminate superior vena cava. Similar hazy opacity of the right chest with obscuration of the right hemidiaphragm and the right heart border. Blunting at the left costophrenic angle with retrocardiac opacity. No pneumothorax. No displaced fracture visualized. IMPRESSION: Similar appearance of layered right pleural effusion with associated atelectasis/consolidation. Small left pleural effusion. Unchanged endotracheal tube, gastric tube, and left IJ central venous catheter. Electronically Signed   By: Corrie Mckusick D.O.   On: 02/04/2019 08:31   Dg Chest Port 1 View  Result Date: 02/03/2019 CLINICAL DATA:  Acute respiratory failure with hypoxia. EXAM: PORTABLE CHEST 1 VIEW COMPARISON:  02/02/2019. FINDINGS: Endotracheal tube terminates approximately 3 cm above the carina. Left IJ central line tip is in the SVC. Heart is enlarged, stable. Thoracic aorta is calcified. Biapical pleural thickening. Mild diffuse mixed interstitial and airspace opacification, right greater than left, with small layering bilateral pleural effusions. Left lower lobe consolidation is new. IMPRESSION: 1. New left lower lobe airspace opacification may be due to atelectasis. 2. Mixed interstitial and airspace opacification, right greater than left, with bilateral pleural  effusions, findings which are suspicious for congestive heart failure. Superimposed pneumonia in the right upper lobe is not excluded. 3.  Aortic atherosclerosis (ICD10-170.0). Electronically Signed   By: Lorin Picket M.D.   On: 02/03/2019 07:42   Dg Chest Port 1 View  Result Date: 02/02/2019 CLINICAL DATA:  Acute respiratory failure with hypoxia. Endotracheal tube present. EXAM: PORTABLE CHEST 1 VIEW COMPARISON:  02/01/2019 FINDINGS: Support lines and tubes in appropriate position. Stable cardiomegaly. Increased asymmetric airspace disease is seen throughout the right lung. No definite pleural effusion. IMPRESSION: 1. Increased asymmetric airspace disease throughout right lung. 2. Stable cardiomegaly. Electronically Signed   By: Earle Gell M.D.   On: 02/02/2019 09:48      Resolved Hospital Problem  list     Assessment & Plan:  Former Smoker Acute Hypoxemic Respiratory Failure -suspect secondary to aspiration . COVID negative Aspiration Event    02/04/2019 - > does not meet criteria for SBT/Extubation in setting of Acute Respiratory Failure due to respiratory issues    P: Extubated 02/04/2019 Being treated for pneumonia with new staph aureus therefore will add Vanco Consider diuresis if blood pressure allows    NEURO Chronic Pain / Long Term Narcotic Use Depression Acute Toxic Metabolic Encephalopathy -secondary to oversedation  6/15 - following commands on fent gtt and appears oriented  P: Narcotics stopped will need to observe for narcotic withdrawal RA SS goal 0   CARDIAC HLD Hx HTN  Chronic AF with RVR -on eliquis, cardizem at baseline; followed by Dr. Martinique Troponin Leak   02/04/2019 - off cardizem gtt due to hypotension. Currently on neo HR 100. 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 BAseline 0.75- 0.95 Lab Results  Component Value Date   CREATININE  0.82 02/04/2019   CREATININE 0.98 02/03/2019   CREATININE 1.12 (H) 02/02/2019     02/04/2019 - Mild AKI 6/14 and better now  P: Monitor creatinine   ELECTROLYTE IMBALANCE  A: low mag and low phos P Replete   GI Chronic GI Disturbances - Diarrhea, Poor Intake, N/V At Risk Malnutrition  GERD Hiatal Hernia  Schatzki's Ring  6/15 - on TF at trickle  P: N.p.o. Hold tube feeding for possible extubation Trend electrolytes   ID A Started on ceftriaxone 02/02/2019  P We will add vancomycin per pharmacy due to staph aureus in sputum Continue cefepime Urine strep and Legionella were negative Respiratory viral panel negative   ENDOCRINE Hypothyroid P:  Continue Synthroid Monitor TSH   Best practice:  Diet: NPO / TF  Pain/Anxiety/Delirium protocol (if indicated): Fentanyl gtt  VAP protocol (if indicated): in place  DVT prophylaxis:IV heparin gtt  GI prophylaxis: PPI Glucose control: n/a Mobility: BR Code Status: Full Family Communication: 6/15 - updated Neysa Hotter - daughter Disposition: ICU      App cct 30 min  Richardson Landry Saira Kramme ACNP Maryanna Shape PCCM Pager [phone] till 1 pm If no answer page [phone] 02/04/2019, 9:09 AM

## 2019-02-04 NOTE — Procedures (Signed)
Extubation Procedure Note  Patient Details:   Name: [name] DOB: 12/11/1945 MRN: [id number]   Airway Documentation:    Vent end date: 02/04/19 Vent end time: 0927   Evaluation  O2 sats: stable throughout Complications: No apparent complications Patient did tolerate procedure well. Bilateral Breath Sounds: Clear   Yes   Patient extubated to 2L nasal cannula.  Positive cuff leak noted.  No evidence of stridor.  Patient able to speak post extubation.  Sats and vitals are stable.  No complications noted.   Judith Part 02/04/2019, 10:47 AM

## 2019-02-04 NOTE — Progress Notes (Signed)
eLink Physician-Brief Progress Note Patient Name: [name] DOB: [birth year] MRN: [id number]   Date of Service  02/04/2019  HPI/Events of Note  Pain and agitation  eICU Interventions  Has knee pain which is chronic Screaming in pain and on precedex, fentanyl patch and on haldol which she just got a couple hours ago  Likely also in opioid withdrawal Try low dose fentanyl x 1 Is only on 2 litr o2 and no respiratory issues  Monitor closely     Intervention Category Intermediate Interventions: Pain - evaluation and management  Margaretmary Lombard 02/04/2019, 11:56 PM

## 2019-02-04 NOTE — Progress Notes (Signed)
RT note: attempted SBT on patient this AM on CPAP/PSV of 12/5.  Patient went apneic and backup ventilation alarmed.  Placed patient back on full support ventilator settings.  Will continue to monitor.
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
## 2019-02-04 NOTE — Progress Notes (Signed)
ANTICOAGULATION CONSULT NOTE - Follow Up Consult  Pharmacy Consult for Apixaban >> Heparin Indication: atrial fibrillation  Allergies  Allergen Reactions  . Penicillins     Causes rash Has patient had a PCN reaction causing immediate rash, facial/tongue/throat swelling, SOB or lightheadedness with hypotension: YES Has patient had a PCN reaction causing severe rash involving mucus membranes or skin necrosis: No Has patient had a PCN reaction that required hospitalization No Has patient had a PCN reaction occurring within the last 10 years: No If all of the above answers are "NO", then may proceed with Cephalosporin use.     Patient Measurements: Height: 5\' 5"  (165.1 cm) Weight: 122 lb 2.2 oz (55.4 kg) IBW/kg (Calculated) : 57 Heparin Dosing Weight: 53.2 kg  Vital Signs: Temp: 97.8 F (36.6 C) (06/16 0100) Temp Source: Axillary (06/16 0100) BP: 108/83 (06/16 0400) Pulse Rate: 91 (06/16 0400)  Labs: Recent Labs    02/02/19 0538 02/02/19 1027  02/02/19 1209 02/02/19 1719 02/02/19 2152 02/03/19 0412 02/03/19 1655 02/04/19 0045 02/04/19 0426  HGB 13.4  --   --   --   --   --  11.7*  --   --  11.6*  HCT 39.7  --   --   --   --   --  36.0  --   --  36.2  PLT 306  --   --   --   --   --  265  --   --  224  APTT  --   --    < > 40*  --   --  52* 55*  --  72*  LABPROT  --   --   --   --   --   --   --   --   --  16.3*  INR  --   --   --   --   --   --   --   --   --  1.3*  HEPARINUNFRC  --   --   --  >2.20*  --   --   --   --   --  0.71*  CREATININE 1.12*  --   --   --   --   --  0.98  --  0.82  --   TROPONINI 3.63* 2.97*  --   --  1.65* 1.36*  --   --   --   --    < > = values in this interval not displayed.    Estimated Creatinine Clearance: 53.4 mL/min (by C-G formula based on SCr of 0.82 mg/dL).  Assessment: 24 YOF who presented on 6/13 with PNA/hypoxia requiring intubation. The patient was on apixaban PTA for hx Afib -The patient's last Apixaban dose was on 6/14 @  1020. Held and transitioned to IV Heparin 06/14 @ 2210. Baseline Heparin Level >2.2- (Heparin Level is falsely elevated due to recent apixaban dose).   APTT 72 sec  Goal of Therapy:  Heparin level 0.3-0.7 units/ml aPTT 66-102 seconds Monitor platelets by anticoagulation protocol: Yes   Plan:  Continue heparin to 900 units / hr Daily HL, aPTT and CBC Monitor for bleeding complications  Thanks for allowing pharmacy to be a part of this patient's care.  Excell Seltzer, PharmD Clinical Pharmacist

## 2019-02-04 NOTE — Progress Notes (Signed)
Pharmacy Antibiotic Note  [name] is a 73 y.o. female admitted on 02/01/2019 with pneumonia.  Pharmacy has been consulted for Vancomycin dosing.  Plan: Vancomycin 1250 mg IV Q 36 H.  Goal AUC: 400-550. Expected AUC: 483.5 Scr used: 0.82  Monitor cultures, length of therapy, and Vancomycin levels as needed.  Height: 5\' 5"  (165.1 cm) Weight: 114 lb 10.2 oz (52 kg) IBW/kg (Calculated) : 57  Temp (24hrs), Avg:98.2 F (36.8 C), Min:97.8 F (36.6 C), Max:98.8 F (37.1 C)  Recent Labs  Lab 02/01/19 0850 02/02/19 0538 02/03/19 0412 02/04/19 0045 02/04/19 0426  WBC 10.6* 18.0* 17.3*  --  11.6*  CREATININE 0.84 1.12* 0.98 0.82  --   LATICACIDVEN  --   --   --  1.2  --     Estimated Creatinine Clearance: 50.2 mL/min (by C-G formula based on SCr of 0.82 mg/dL).    Allergies  Allergen Reactions  . Penicillins     Causes rash Has patient had a PCN reaction causing immediate rash, facial/tongue/throat swelling, SOB or lightheadedness with hypotension: YES Has patient had a PCN reaction causing severe rash involving mucus membranes or skin necrosis: No Has patient had a PCN reaction that required hospitalization No Has patient had a PCN reaction occurring within the last 10 years: No If all of the above answers are "NO", then may proceed with Cephalosporin use.     Antimicrobials this admission: Ceftriaxone 6/14 >> 6/15 Cefepime 6/15 >> 6/16  Dose adjustments this admission: N/A  Microbiology results: 6/13 COVID, rapid and send-out: neg 6/13 MRSA PCR: neg 6/13 BCx: ngtd 6/14 TA Cx: Mod staph aureus, susceptibilities to follow 6/15 Resp PCR: neg   Thank you for allowing pharmacy to be a part of this patient's care.  Lenon Oms 02/04/2019 9:30 AM

## 2019-02-04 NOTE — Procedures (Signed)
Thoracentesis Procedure Note  Pre-operative Diagnosis: R pleural effusion  Post-operative Diagnosis: same  Indications: Dyspnea, PNA, effusion  Procedure Details  Consent: Informed consent was obtained. Risks of the procedure were discussed including: infection, bleeding, pain, pneumothorax.  Under sterile conditions the patient was positioned. Betadine solution and sterile drapes were utilized.   1% plain lidocaine was used to anesthetize the R 9th rib space. Fluid was obtained without any difficulties and minimal blood loss.  A dressing was applied to the wound and wound care instructions were provided.   Findings 750 ml of clear pleural fluid was obtained. A sample was sent to Pathology for cytogenetics, flow, and cell counts, as well as for infection analysis.  Complications:  None; patient tolerated the procedure well.          Condition: stable  Plan A follow up chest x-ray was ordered. Bed Rest for 0 hours. Tylenol 650 mg. for pain.  Attending Attestation: I was present and scrubbed for the entire procedure.
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
## 2019-02-05 ENCOUNTER — Inpatient Hospital Stay (HOSPITAL_COMMUNITY): Payer: Medicare Other

## 2019-02-05 DIAGNOSIS — E8779 Other fluid overload: Secondary | ICD-10-CM

## 2019-02-05 DIAGNOSIS — J15211 Pneumonia due to Methicillin susceptible Staphylococcus aureus: Secondary | ICD-10-CM

## 2019-02-05 LAB — PH, BODY FLUID: pH, Body Fluid: 7.9

## 2019-02-05 LAB — GLUCOSE, CAPILLARY
Glucose-Capillary: 107 mg/dL — ABNORMAL HIGH (ref 70–99)
Glucose-Capillary: 97 mg/dL (ref 70–99)
Glucose-Capillary: 93 mg/dL (ref 70–99)
Glucose-Capillary: 87 mg/dL (ref 70–99)
Glucose-Capillary: 107 mg/dL — ABNORMAL HIGH (ref 70–99)
Glucose-Capillary: 93 mg/dL (ref 70–99)

## 2019-02-05 LAB — PHOSPHORUS: Phosphorus: 3.7 mg/dL (ref 2.5–4.6)

## 2019-02-05 LAB — CBC WITH DIFFERENTIAL/PLATELET
Abs Immature Granulocytes: 0.04 10*3/uL (ref 0.00–0.07)
Basophils Absolute: 0 10*3/uL (ref 0.0–0.1)
Basophils Relative: 0 %
Eosinophils Absolute: 0 10*3/uL (ref 0.0–0.5)
Eosinophils Relative: 0 %
HCT: 33.4 % — ABNORMAL LOW (ref 36.0–46.0)
Hemoglobin: 10.8 g/dL — ABNORMAL LOW (ref 12.0–15.0)
Immature Granulocytes: 1 %
Lymphocytes Relative: 20 %
Lymphs Abs: 1.4 10*3/uL (ref 0.7–4.0)
MCH: 32 pg (ref 26.0–34.0)
MCHC: 32.3 g/dL (ref 30.0–36.0)
MCV: 98.8 fL (ref 80.0–100.0)
Monocytes Absolute: 0.7 10*3/uL (ref 0.1–1.0)
Monocytes Relative: 9 %
Neutro Abs: 5.1 10*3/uL (ref 1.7–7.7)
Neutrophils Relative %: 70 %
Platelets: 152 10*3/uL (ref 150–400)
RBC: 3.38 MIL/uL — ABNORMAL LOW (ref 3.87–5.11)
RDW: 12.8 % (ref 11.5–15.5)
WBC: 7.2 10*3/uL (ref 4.0–10.5)
nRBC: 0.7 % — ABNORMAL HIGH (ref 0.0–0.2)

## 2019-02-05 LAB — BASIC METABOLIC PANEL
Anion gap: 11 (ref 5–15)
BUN: 16 mg/dL (ref 8–23)
CO2: 25 mmol/L (ref 22–32)
Calcium: 8.9 mg/dL (ref 8.9–10.3)
Chloride: 108 mmol/L (ref 98–111)
Creatinine, Ser: 0.81 mg/dL (ref 0.44–1.00)
GFR calc Af Amer: >60 mL/min (ref >60)
GFR calc non Af Amer: >60 mL/min (ref >60)
Glucose, Bld: 107 mg/dL — ABNORMAL HIGH (ref 70–99)
Potassium: 3.8 mmol/L (ref 3.5–5.1)
Sodium: 144 mmol/L (ref 135–145)

## 2019-02-05 LAB — CULTURE, RESPIRATORY W GRAM STAIN: Special Requests: NORMAL

## 2019-02-05 LAB — HEPARIN LEVEL (UNFRACTIONATED): Heparin Unfractionated: 0.46 IU/mL (ref 0.30–0.70)

## 2019-02-05 LAB — COOXEMETRY PANEL
Carboxyhemoglobin: 1.1 % (ref 0.5–1.5)
Methemoglobin: 0.6 % (ref 0.0–1.5)
O2 Saturation: 64.5 %
Total hemoglobin: 11.4 g/dL — ABNORMAL LOW (ref 12.0–16.0)

## 2019-02-05 LAB — APTT
aPTT: 119 seconds — ABNORMAL HIGH (ref 24–36)
aPTT: 79 seconds — ABNORMAL HIGH (ref 24–36)

## 2019-02-05 LAB — MAGNESIUM: Magnesium: 1.8 mg/dL (ref 1.7–2.4)

## 2019-02-05 MED ORDER — SODIUM CHLORIDE 0.9 % IV SOLN
2.0000 g | INTRAVENOUS | Status: DC
  Administered 2019-02-05 – 2019-02-06 (×2): 2 g via INTRAVENOUS
  Filled 2019-02-05 (×2): qty 20

## 2019-02-05 MED ORDER — CLONIDINE HCL 0.1 MG/24HR TD PTWK
0.1000 mg | MEDICATED_PATCH | TRANSDERMAL | Status: DC
  Administered 2019-02-05: 0.1 mg via TRANSDERMAL
  Filled 2019-02-05: qty 1

## 2019-02-05 MED ORDER — FUROSEMIDE 40 MG PO TABS
40.0000 mg | ORAL_TABLET | Freq: Every day | ORAL | Status: DC
  Administered 2019-02-06 – 2019-02-09 (×4): 40 mg via ORAL
  Filled 2019-02-05 (×4): qty 1

## 2019-02-05 MED ORDER — ROPINIROLE HCL 1 MG PO TABS
1.0000 mg | ORAL_TABLET | Freq: Every evening | ORAL | Status: DC | PRN
  Administered 2019-02-06 – 2019-02-12 (×5): 1 mg via ORAL
  Filled 2019-02-05 (×5): qty 1

## 2019-02-05 MED ORDER — ATORVASTATIN CALCIUM 10 MG PO TABS
10.0000 mg | ORAL_TABLET | Freq: Every day | ORAL | Status: DC
  Administered 2019-02-05 – 2019-02-12 (×8): 10 mg via ORAL
  Filled 2019-02-05 (×9): qty 1

## 2019-02-05 MED ORDER — MAGNESIUM SULFATE 2 GM/50ML IV SOLN
2.0000 g | Freq: Once | INTRAVENOUS | Status: AC
  Administered 2019-02-05: 2 g via INTRAVENOUS
  Filled 2019-02-05: qty 50

## 2019-02-05 MED ORDER — FUROSEMIDE 40 MG PO TABS: 20.0000 mg | ORAL_TABLET | Freq: Every day | ORAL | Status: DC

## 2019-02-05 NOTE — Progress Notes (Signed)
Advanced Heart Failure Rounding Note   Subjective:    Remains extubated. Had R thoracentesis on 6/16. Breathing better today. No CP.   Remains on low-dose precedex. Mildly confused. SBP in low 100s. AF rate better in 80s. CVP 10  Objective:   Weight Range:  Vital Signs:   Temp:  [96.2 F (35.7 C)-98.6 F (37 C)] 96.2 F (35.7 C) (06/17 0700) Pulse Rate:  [39-127] 71 (06/17 0500) Resp:  [12-29] 12 (06/17 0600) BP: (90-125)/(57-100) 111/100 (06/17 0630) SpO2:  [85 %-100 %] 99 % (06/17 0500) Weight:  [49.9 kg] 49.9 kg (06/17 0423) Last BM Date: 02/04/19  Weight change: Filed Weights   02/03/19 0423 02/04/19 0500 02/05/19 0423  Weight: 55.4 kg 52 kg 49.9 kg    Intake/Output:   Intake/Output Summary (Last 24 hours) at 02/05/2019 0755 Last data filed at 02/05/2019 0600 Gross per 24 hour  Intake 1423.88 ml  Output 2525 ml  Net -1101.12 ml     Physical Exam: General:  Chronically ill and thin appearing. Awake. Mildly confused HEENT: normal Neck: supple. no JVD 10. Carotids 2+ bilat; no bruits. No lymphadenopathy or thryomegaly appreciated. Cor: PMI nondisplaced. Irregular rate & rhythm. No rubs, gallops or murmurs. Lungs: Coarse. Dull r base Abdomen: soft, nontender, nondistended. No hepatosplenomegaly. No bruits or masses. Good bowel sounds. Extremities: no cyanosis, clubbing, rash, edema Neuro: alert & orientedx3, cranial nerves grossly intact. moves all 4 extremities w/o difficulty. Affect pleasan   Telemetry:  AF 80-90s Personally reviewed   Labs: Basic Metabolic Panel: Recent Labs  Lab 02/01/19 0850 02/01/19 2044 02/02/19 0538 02/03/19 0412 02/04/19 0045 02/04/19 0426 02/05/19 0423  NA 139 136 137 141 142  --   --   K 2.7* 2.7* 5.1 3.8 3.8  --   --   CL 100  --  107 112* 111  --   --   CO2 26  --  23 24 25   --   --   GLUCOSE 164*  --  75 100* 117*  --   --   BUN 11  --  16 17 20   --   --   CREATININE 0.84  --  1.12* 0.98 0.82  --   --   CALCIUM  9.4  --  9.3 8.6* 8.5*  --   --   MG 1.3*  --  2.1 1.8  --  1.9 1.8  PHOS  --   --   --  2.0*  --  1.6* 3.7    Liver Function Tests: Recent Labs  Lab 02/01/19 0850 02/04/19 0426  AST 49* 123*  ALT 23 231*  ALKPHOS 91 59  BILITOT 1.5* 0.6  PROT 6.4* 4.9*  ALBUMIN 3.9 2.4*   Recent Labs  Lab 02/04/19 0426  LIPASE 23  AMYLASE 13*   No results for input(s): AMMONIA in the last 168 hours.  CBC: Recent Labs  Lab 02/01/19 0850 02/01/19 2044 02/02/19 0538 02/03/19 0412 02/04/19 0426 02/05/19 0423  WBC 10.6*  --  18.0* 17.3* 11.6* 7.2  NEUTROABS 9.0*  --   --   --  8.0* 5.1  HGB 12.3 13.6 13.4 11.7* 11.6* 10.8*  HCT 36.9 40.0 39.7 36.0 36.2 33.4*  MCV 94.1  --  94.7 98.1 100.0 98.8  PLT 277  --  306 265 224 152    Cardiac Enzymes: Recent Labs  Lab 02/02/19 0157 02/02/19 0538 02/02/19 1027 02/02/19 1719 02/02/19 2152  TROPONINI 2.83* 3.63* 2.97* 1.65* 1.36*  BNP: BNP (last 3 results) No results for input(s): BNP in the last 8760 hours.  ProBNP (last 3 results) No results for input(s): PROBNP in the last 8760 hours.    Other results:  Imaging: Dg Chest Port 1 View  Result Date: 02/04/2019 CLINICAL DATA:  73 year old female with a history of endotracheal tube EXAM: PORTABLE CHEST 1 VIEW COMPARISON:  02/03/2019 02/02/2019, 02/01/2019 FINDINGS: Cardiomediastinal silhouette unchanged in size and contour. Unchanged endotracheal tube terminating just above the carina 2.8 cm. Unchanged gastric tube terminating below the diaphragm out of the field of view. Unchanged left IJ central venous catheter with the tip appearing to terminate superior vena cava. Similar hazy opacity of the right chest with obscuration of the right hemidiaphragm and the right heart border. Blunting at the left costophrenic angle with retrocardiac opacity. No pneumothorax. No displaced fracture visualized. IMPRESSION: Similar appearance of layered right pleural effusion with associated  atelectasis/consolidation. Small left pleural effusion. Unchanged endotracheal tube, gastric tube, and left IJ central venous catheter. Electronically Signed   By: Corrie Mckusick D.O.   On: 02/04/2019 08:31     Medications:     Scheduled Medications: . atorvastatin  10 mg Per Tube Daily  . Chlorhexidine Gluconate Cloth  6 each Topical Daily  . digoxin  0.125 mg Oral Daily  . fentaNYL  1 patch Transdermal Q72H  . furosemide  40 mg Oral Daily  . insulin aspart  0-15 Units Subcutaneous Q4H  . levothyroxine  88 mcg Per Tube Daily  . mouth rinse  15 mL Mouth Rinse BID  . pantoprazole sodium  40 mg Per Tube BID  . sodium chloride flush  10-40 mL Intracatheter Q12H  . spironolactone  12.5 mg Oral Daily    Infusions: . sodium chloride    . dexmedetomidine (PRECEDEX) IV infusion 1 mcg/kg/hr (02/05/19 0600)  . feeding supplement (VITAL AF 1.2 CAL) 1,000 mL (02/03/19 1100)  . heparin 900 Units/hr (02/05/19 0743)  . vancomycin Stopped (02/04/19 1204)    PRN Medications: sodium chloride, acetaminophen **OR** acetaminophen, albuterol, fentaNYL, fluticasone, haloperidol lactate, naLOXone (NARCAN)  injection, ondansetron **OR** ondansetron (ZOFRAN) IV, rOPINIRole, sodium chloride flush   Assessment/plan :   1. Acute on chronic hypoxic respiratory failure in setting of aspiration PNA - has evere underlying COPD on CXR. Per chart quit tobacco in 2001. No PFTs on chart - admitted with severe R-sided PNA likely due to aspiration. - intubated 6/13. Extubated 6/16- extubated this am but remains tenuous. - improved after R thors 6/16 (transudate)  - CCM managing.  - Keep dry with diuretics (weight down 4 pounds overnight). CVP 10. Continue lasix 40 daily   2. Chronic AF with RVR - rate improved - off diltiazem due to low EF - can use amio for rate control as needed - Eliquis switched to heparin. dbph  3. Acute systolic HF - echo from 2/35 shows EF 20% with Tako-tsubo pattern. Suspect  stress CM - BP too low for ARB or ARNI - Continue low-dose spiro and digoxin and lasix - Will need cath this admitwhen MS clears  4. NSTEMI - Troponin elevated in 2-3 range. Now coming down. No CP ECG with new iRBBB and mild lateral TW changes - suspect demand ischemia in setting of severe medical illness.  - however echo from 6/14 suggestive of Tako-Tsubo EF 20% - continue heparin, ASA, statin - probable cath as she recovers.  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 Cyndia Bent and Dr. [place]  5. Hypokalemia/hypomag - these have been supped   6. Chronic GI issues and malnutrition - now with aspiration PNA  Length of Stay: 3   Kenzee Bassin  MD 02/05/2019, 7:55 AM  Advanced Heart Failure Team Pager [phone] (M-F; [place])  Please contact [facility] for night-coverage after hours (4p -7a ) and weekends on [email or web address]

## 2019-02-05 NOTE — Progress Notes (Signed)
Upon assessment, foley and flexiseal pulled out by patient, 1:1 sitter at bedside at the time. No visible trauma to exterior perineum. Purewick placed on patient and flexiseal replaced. Will continue to monitor.

## 2019-02-05 NOTE — Progress Notes (Signed)
[facility] Progress Note Patient Name: ELBIA PARO DOB: [birth year] MRN: [id number]   Date of Service  02/05/2019  HPI/Events of Note  Orders  eICU Interventions  Asked to renew orders for 1:1 sitter     Intervention Category Minor Interventions: Routine modifications to care plan (e.g. PRN medications for pain, fever)  Margaretmary Lombard 02/05/2019, 11:47 PM

## 2019-02-05 NOTE — Progress Notes (Signed)
Upon assessment, pt's central line appeared to be retracted partially. All lumens flushed, but no blood return at that time. MD notified and chest xray ordered to verify placement. Per MD, results indicate central line should not be used to infuse, however verbal order to not remove d/t risk of bleeding as pt is on heparin gtt. At this time, all lumens flush and 2 lumens draw back blood.

## 2019-02-05 NOTE — Progress Notes (Signed)
Rehab Admissions Coordinator Note:  Patient was screened by Cleatrice Burke for appropriateness for an Inpatient Acute Rehab Consult per OT recommendation.  At this time, we are recommending Inpatient Rehab consult if pt/family would like pt considered for admit. Please advise.  Danne Baxter, RN, MSN Rehab Admissions Coordinator [phone] 02/05/2019 1:41 PM

## 2019-02-05 NOTE — Progress Notes (Signed)
ANTICOAGULATION CONSULT NOTE - Follow Up Consult  Pharmacy Consult for heparin Indication: atrial fibrillation   Labs: Recent Labs    02/02/19 0538 02/02/19 1027  02/02/19 1209 02/02/19 1719 02/02/19 2152 02/03/19 0412  02/04/19 0045 02/04/19 0426 02/04/19 1300 02/04/19 2256  HGB 13.4  --   --   --   --   --  11.7*  --   --  11.6*  --   --   HCT 39.7  --   --   --   --   --  36.0  --   --  36.2  --   --   PLT 306  --   --   --   --   --  265  --   --  224  --   --   APTT  --   --    < > 40*  --   --  52*   < >  --  72* 65* 119*  LABPROT  --   --   --   --   --   --   --   --   --  16.3*  --   --   INR  --   --   --   --   --   --   --   --   --  1.3*  --   --   HEPARINUNFRC  --   --   --  >2.20*  --   --   --   --   --  0.71*  --   --   CREATININE 1.12*  --   --   --   --   --  0.98  --  0.82  --   --   --   TROPONINI 3.63* 2.97*  --   --  1.65* 1.36*  --   --   --   --   --   --    < > = values in this interval not displayed.    Assessment: 73yo female now supratherapeutic on heparin after rate change; no gtt issues or bleeding concerns per RN.  Goal of Therapy:  aPTT 66-102 seconds   Plan:  Will decrease heparin gtt slightly to 900 units/hr and check PTT in 8 hours.    Wynona Neat, PharmD, BCPS  02/05/2019,2:49 AM

## 2019-02-05 NOTE — Progress Notes (Signed)
NAME:  [name], MRN:  [id number], DOB:  04/24/1946, LOS: 3 ADMISSION DATE:  02/01/2019, CONSULTATION DATE:  02/01/19 REFERRING MD:  Fuller Plan CHIEF COMPLAINT:  Hypoxemia  Brief History    73 year old female with past medical history below who initially presented with nausea, vomiting and diarrhea and found with profound hypokalemia and hypomagnesemia.  During her hospital course electrolytes were repleted.  Overnight patient was given Ativan and was found to have altered mental statu.  PCCM consulted for hypoxemia.  Past Medical History  Hypertension hyperlipidemia, paraoxysmal atrial fibrillation on Eliquis, CVA, hypothyroidism, COPD, depression, anemia, arthritis, chronic opioid use  Significant Hospital Events   6/13 Admit  6/13 Transferred to ICU 6/14 - RN reports pt with AF, rate up to 140.  Intermittent agitation - given versed x1.  Decreased UOP per RN.  Afebrile.  I/O - 1.8L positive in last 24 hours / 500 UOP   Consults:  PCCM  Procedures:  ETT 6/13 >> 02/04/2019 L IJ CVC 6/13 >>  Significant Diagnostic Tests:  UDS 6/13 >> positive for opiates  Micro Data:  COVID 6/13 >> negative  Trach aspirate 6/13 >> staph aureus>> pansensitive BCx2 6/13 >> negative  COVID (send out) 6/13 >>  negative  Antimicrobials:  Ceftriaxone 6/13 >> 02/03/2019 restarted 02/05/2019 for total 7-day>> 02/03/2019 cefepime>> 02/05/2019 02/04/2019 vancomycin per pharmacy>> 02/05/2019  Interim history/subjective:    Remains extubated.  Agitation remains an issue.  Long term narcotic use and mild narcotic withdrawal remain an issue.  We will try low-dose Catapres.  Get her up and move her around.  Try to get her on p.o.'s when she had a swallow evaluation and then de-intensify her care.  Objective   Blood pressure (!) 111/100, pulse 71, temperature (!) 96.2 F (35.7 C), temperature source Axillary, resp. rate 12, height 5\' 5"  (1.651 m), weight 49.9 kg, SpO2 99 %.        Intake/Output  Summary (Last 24 hours) at 02/05/2019 0852 Last data filed at 02/05/2019 0600 Gross per 24 hour  Intake 1378.07 ml  Output 2525 ml  Net -1146.93 ml   Filed Weights   02/03/19 0423 02/04/19 0500 02/05/19 0423  Weight: 55.4 kg 52 kg 49.9 kg     General: Frail elderly female is intermittently confused HEENT: No JVD or lymphadenopathy is appreciated Neuro: Moves all extremities follows commands at times. CV: Sounds are regular irregular with controlled ventricular rate PULM: Creased breath sounds throughout OF:BPZW, non-tender, bsx4 active  Extremities: warm/dry, negative edema  Skin: no rashes or lesions       LABS    PULMONARY Recent Labs  Lab 02/01/19 1530 02/01/19 2044 02/04/19 1030  PHART 7.469* 7.401  --   PCO2ART 34.1 32.2  --   PO2ART 48.7* 244.0*  --   HCO3 24.5 20.1  --   TCO2  --  21*  --   O2SAT 82.6 100.0 55.5    CBC Recent Labs  Lab 02/03/19 0412 02/04/19 0426 02/05/19 0423  HGB 11.7* 11.6* 10.8*  HCT 36.0 36.2 33.4*  WBC 17.3* 11.6* 7.2  PLT 265 224 152    COAGULATION Recent Labs  Lab 02/04/19 0426  INR 1.3*    CARDIAC   Recent Labs  Lab 02/02/19 0157 02/02/19 0538 02/02/19 1027 02/02/19 1719 02/02/19 2152  TROPONINI 2.83* 3.63* 2.97* 1.65* 1.36*   No results for input(s): PROBNP in the last 168 hours.   CHEMISTRY Recent Labs  Lab 02/01/19 2585 02/01/19 2044 02/02/19 2778 02/03/19 2423  02/04/19 0045 02/04/19 0426 02/05/19 0423  NA 139 136 137 141 142  --   --   K 2.7* 2.7* 5.1 3.8 3.8  --   --   CL 100  --  107 112* 111  --   --   CO2 26  --  23 24 25   --   --   GLUCOSE 164*  --  75 100* 117*  --   --   BUN 11  --  16 17 20   --   --   CREATININE 0.84  --  1.12* 0.98 0.82  --   --   CALCIUM 9.4  --  9.3 8.6* 8.5*  --   --   MG 1.3*  --  2.1 1.8  --  1.9 1.8  PHOS  --   --   --  2.0*  --  1.6* 3.7   Estimated Creatinine Clearance: 48.1 mL/min (by C-G formula based on SCr of 0.82 mg/dL).   LIVER Recent Labs   Lab 02/01/19 0850 02/04/19 0426  AST 49* 123*  ALT 23 231*  ALKPHOS 91 59  BILITOT 1.5* 0.6  PROT 6.4* 4.9*  ALBUMIN 3.9 2.4*  INR  --  1.3*     INFECTIOUS Recent Labs  Lab 02/04/19 0045  LATICACIDVEN 1.2     ENDOCRINE CBG (last 3)  Recent Labs    02/04/19 2312 02/05/19 0405 02/05/19 0729  GLUCAP 96 107* 97         IMAGING x48h  - image(s) personally visualized  -   highlighted in bold Dg Chest Port 1 View  Result Date: 02/05/2019 CLINICAL DATA:  Status post thoracentesis EXAM: PORTABLE CHEST 1 VIEW COMPARISON:  February 04, 2019 FINDINGS: The left central line is stable. The ET tube is been removed. Stable cardiomegaly. The hila and mediastinum are unremarkable. No pneumothorax. No nodules or masses. Continued opacity throughout the right lung, likely layering effusion with underlying opacity, either atelectasis or infiltrate. Asymmetric edema is also possible. Retrocardiac opacity on the left with a probable small left effusion is stable. IMPRESSION: 1. The ET tube is been removed.  The left central line is stable. 2. Continued opacity throughout the right lung. There is likely a component of layering effusion. The underlying opacity could represent asymmetric edema, atelectasis, or infiltrate. 3. Small effusion and underlying atelectasis on the left. Electronically Signed   By: Dorise Bullion III M.D   On: 02/05/2019 08:15   Dg Chest Port 1 View  Result Date: 02/04/2019 CLINICAL DATA:  73 year old female with a history of endotracheal tube EXAM: PORTABLE CHEST 1 VIEW COMPARISON:  02/03/2019 02/02/2019, 02/01/2019 FINDINGS: Cardiomediastinal silhouette unchanged in size and contour. Unchanged endotracheal tube terminating just above the carina 2.8 cm. Unchanged gastric tube terminating below the diaphragm out of the field of view. Unchanged left IJ central venous catheter with the tip appearing to terminate superior vena cava. Similar hazy opacity of the right chest with  obscuration of the right hemidiaphragm and the right heart border. Blunting at the left costophrenic angle with retrocardiac opacity. No pneumothorax. No displaced fracture visualized. IMPRESSION: Similar appearance of layered right pleural effusion with associated atelectasis/consolidation. Small left pleural effusion. Unchanged endotracheal tube, gastric tube, and left IJ central venous catheter. Electronically Signed   By: Corrie Mckusick D.O.   On: 02/04/2019 08:31      Resolved Hospital Problem list     Assessment & Plan:  Former Smoker Acute Hypoxemic Respiratory Failure -suspect secondary to aspiration .  COVID negative Aspiration Event        P: Extubated 02/04/2019 O2 to keep sats parent 88% Continue antimicrobial therapy for staph aureus pansensitive in sputum change from cefepime and vancomycin to ceftriaxone on 02/05/2019 plan for 7 days of antimicrobial therapy    NEURO Chronic Pain / Long Term Narcotic Use Depression Acute Toxic Metabolic Encephalopathy -secondary to oversedation    P: Low-dose fentanyl patch Continue to observe for narcotic withdrawal We will try low-dose Catapres patch    CARDIAC HLD Hx HTN  Chronic AF with RVR -on eliquis, cardizem at baseline; followed by Dr. [place] Troponin Leak     P: Continue IV heparin until she is able to take p.o. 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 BAseline 0.75- 0.95 Lab Results  Component Value Date   CREATININE 0.82 02/04/2019   CREATININE 0.98 02/03/2019   CREATININE 1.12 (H) 02/02/2019     02/05/2019 - Mild AKI 6/14 and better now  P: 02/05/2019 creatinine is normalized   ELECTROLYTE IMBALANCE  A: low mag and low phos P Repleted and normalized on 02/05/2019   GI Chronic GI Disturbances - Diarrhea, Poor Intake, N/V At Risk Malnutrition  GERD Hiatal Hernia  Schatzki's Ring     P: Currently n.p.o. until she has a swallow evaluation When she has swallowing  evaluation will start p.o. diet   ID A Started on ceftriaxone 02/02/2019  P 02/05/2019 vancomycin be discontinued Staph aureus is noted to be pansensitive We will resume ceftriaxone for at least 7 days   ENDOCRINE Hypothyroid P:  Synthroid continue    Best practice:  Diet: NPO / TF  Pain/Anxiety/Delirium protocol (if indicated): Fentanyl gtt  VAP protocol (if indicated): in place  DVT prophylaxis:IV heparin gtt  GI prophylaxis: PPI Glucose control: n/a Mobility: BR Code Status: Full Family Communication: 6/15 - updated Neysa Hotter - daughter Disposition: ICU      App cct 30 min  Richardson Landry Dorrance Sellick ACNP Maryanna Shape PCCM Pager [phone] till 1 pm If no answer page [phone] 02/05/2019, 8:52 AM

## 2019-02-05 NOTE — Evaluation (Signed)
Occupational Therapy Evaluation Patient Details Name: [name] MRN: [id number] DOB: 06-20-1946 Today's Date: 02/05/2019    History of Present Illness Pt is a 73 year old woman admitted 02/01/19 with acute on chronic respiratory failure, profound hypokalemia and hypomagnsemia. Intubated 6/13-6/16. Thoracentesis 6/15. PMH: COPD, afib wit RR, CHF, chronic GI issues and malnutrition, CKD.    Clinical Impression   Pt reports functioning modified independently in ADL and walking independently prior to admission. Presents with lethargy, keeping eyes closed much of session and impaired cognition. Pt is generally weak with poor sitting and standing balance. She requires 2 person hand held assist to pivot bed to chair. She requires set up to total assist for ADL. Pt likely to progress well with intensive rehab. Recommending CIR. Will follow acutely.    Follow Up Recommendations  CIR    Equipment Recommendations  None recommended by OT    Recommendations for Other Services Rehab consult     Precautions / Restrictions Precautions Precautions: Fall Precaution Comments: flexiseal Restrictions Weight Bearing Restrictions: No      Mobility Bed Mobility Overal bed mobility: Needs Assistance Bed Mobility: Supine to Sit     Supine to sit: Min assist     General bed mobility comments: increased time, assist to guide LEs over EOB and to raise trunk  Transfers Overall transfer level: Needs assistance Equipment used: 2 person hand held assist Transfers: Sit to/from Stand;Stand Pivot Transfers Sit to Stand: +2 physical assistance;Min assist Stand pivot transfers: +2 physical assistance;Min assist       General transfer comment: assist to rise and steady as pt took pivotal steps to chair    Balance Overall balance assessment: Needs assistance Sitting-balance support: Bilateral upper extremity supported;Feet supported Sitting balance-Leahy Scale: Poor Sitting balance - Comments: pt  leaning posterior intermittently   Standing balance support: Bilateral upper extremity supported Standing balance-Leahy Scale: Poor                             ADL either performed or assessed with clinical judgement   ADL Overall ADL's : Needs assistance/impaired Eating/Feeding: Set up;Sitting   Grooming: Minimal assistance;Sitting   Upper Body Bathing: Moderate assistance;Sitting   Lower Body Bathing: Maximal assistance;+2 for physical assistance;Sit to/from stand   Upper Body Dressing : Moderate assistance;Sitting   Lower Body Dressing: Maximal assistance;+2 for physical assistance;Sit to/from stand   Toilet Transfer: +2 for physical assistance;Minimal assistance;Stand-pivot   Toileting- Clothing Manipulation and Hygiene: +2 for physical assistance;Total assistance;Sit to/from stand               Vision Patient Visual Report: No change from baseline Additional Comments: hx of orbital stroke--R     Perception     Praxis      Pertinent Vitals/Pain Pain Assessment: Faces Faces Pain Scale: No hurt     Hand Dominance Right   Extremity/Trunk Assessment Upper Extremity Assessment Upper Extremity Assessment: Generalized weakness(arthritic changes in hands)   Lower Extremity Assessment Lower Extremity Assessment: Defer to PT evaluation   Cervical / Trunk Assessment Cervical / Trunk Assessment: Kyphotic   Communication Communication Communication: Expressive difficulties   Cognition Arousal/Alertness: Awake/alert(closed eyes) Behavior During Therapy: Flat affect Overall Cognitive Status: Impaired/Different from baseline Area of Impairment: Orientation;Following commands;Safety/judgement;Problem solving;Memory                 Orientation Level: Disoriented to;Time;Situation   Memory: Decreased short-term memory Following Commands: Follows one step commands with increased time;Follows  one step commands inconsistently Safety/Judgement:  Decreased awareness of safety;Decreased awareness of deficits   Problem Solving: Slow processing;Decreased initiation;Difficulty sequencing;Requires verbal cues;Requires tactile cues     General Comments       Exercises     Shoulder Instructions      Home Living Family/patient expects to be discharged to:: Private residence Living Arrangements: Spouse/significant other Available Help at Discharge: Family;Available 24 hours/day Type of Home: Other(Comment)(condo) Home Access: Stairs to enter Entrance Stairs-Number of Steps: 3 Entrance Stairs-Rails: Right;Left Home Layout: One level     Bathroom Shower/Tub: Occupational psychologist: Standard     Home Equipment: Environmental consultant - 2 wheels;Shower seat;Hand held shower head;Bedside commode;Cane - single point;Grab bars - tub/shower;Wheelchair - manual          Prior Functioning/Environment Level of Independence: Independent with assistive device(s)        Comments: uses BSC over toilet, sits to shower, no device for ambulation        OT Problem List: Decreased strength;Decreased activity tolerance;Impaired balance (sitting and/or standing);Decreased cognition;Decreased safety awareness;Decreased knowledge of use of DME or AE;Cardiopulmonary status limiting activity      OT Treatment/Interventions: Self-care/ADL training;DME and/or AE instruction;Cognitive remediation/compensation;Patient/family education;Balance training;Therapeutic activities    OT Goals(Current goals can be found in the care plan section) Acute Rehab OT Goals Patient Stated Goal: get stronger OT Goal Formulation: With patient Time For Goal Achievement: 02/19/19 Potential to Achieve Goals: Good ADL Goals Pt Will Perform Grooming: with min assist;standing Pt Will Perform Upper Body Dressing: with set-up;with supervision;sitting Pt Will Perform Lower Body Dressing: with min assist;sit to/from stand Pt Will Transfer to Toilet: with min  assist;ambulating;bedside commode Pt Will Perform Toileting - Clothing Manipulation and hygiene: with min assist;sit to/from stand  OT Frequency: Min 2X/week   Barriers to D/C:            Co-evaluation PT/OT/SLP Co-Evaluation/Treatment: Yes Reason for Co-Treatment: Complexity of the patient's impairments (multi-system involvement);For patient/therapist safety   OT goals addressed during session: ADL's and self-care      AM-PAC OT "6 Clicks" Daily Activity     Outcome Measure Help from another person eating meals?: A Little Help from another person taking care of personal grooming?: A Little Help from another person toileting, which includes using toliet, bedpan, or urinal?: Total Help from another person bathing (including washing, rinsing, drying)?: A Lot Help from another person to put on and taking off regular upper body clothing?: A Lot Help from another person to put on and taking off regular lower body clothing?: Total 6 Click Score: 12   End of Session Equipment Utilized During Treatment: Oxygen(1.5L) Nurse Communication: Mobility status  Activity Tolerance: Patient tolerated treatment well Patient left: in chair;with call bell/phone within reach;with chair alarm set  OT Visit Diagnosis: Unsteadiness on feet (R26.81);Other abnormalities of gait and mobility (R26.89);Muscle weakness (generalized) (M62.81);Other symptoms and signs involving cognitive function                Time: 2094-7096 OT Time Calculation (min): 23 min Charges:  OT General Charges $OT Visit: 1 Visit OT Evaluation $OT Eval Moderate Complexity: 1 Mod  Nestor Lewandowsky, OTR/L Acute Rehabilitation Services Pager: [phone] Office: [phone]  Malka So 02/05/2019, 1:14 PM
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
## 2019-02-05 NOTE — Progress Notes (Signed)
Assisted tele visit to patient with daughter, Anne Ng.  Maryelizabeth Rowan, RN

## 2019-02-05 NOTE — Evaluation (Signed)
Physical Therapy Evaluation Patient Details Name: [name] MRN: [id number] DOB: 06/08/1946 Today's Date: 02/05/2019   History of Present Illness  Pt is a 73 year old woman admitted 02/01/19 with acute on chronic respiratory failure, profound hypokalemia and hypomagnsemia. Intubated 6/13-6/16. Thoracentesis 6/15. PMH: COPD, afib wit RR, CHF, chronic GI issues and malnutrition, CKD.   Clinical Impression  Pt admitted with above diagnosis. Pt currently with functional limitations due to the deficits listed below (see PT Problem List). Pt was able to stand and pivot with min +2 assist. Pt desat to 85% on RA and needed to keep O2 on at 2L to keep sats >90%.  Pt deconditioned and poor posture as she is very kyphotic.  Pt needs REhab to gain strength prior to d/c home.   Pt will benefit from skilled PT to increase their independence and safety with mobility to allow discharge to the venue listed below.      Follow Up Recommendations CIR;Supervision/Assistance - 24 hour    Equipment Recommendations  None recommended by PT    Recommendations for Other Services       Precautions / Restrictions Precautions Precautions: Fall Precaution Comments: flexiseal Restrictions Weight Bearing Restrictions: No      Mobility  Bed Mobility Overal bed mobility: Needs Assistance Bed Mobility: Supine to Sit     Supine to sit: Min assist     General bed mobility comments: increased time, assist to guide LEs over EOB and to raise trunk  Transfers Overall transfer level: Needs assistance Equipment used: 2 person hand held assist Transfers: Sit to/from Stand;Stand Pivot Transfers Sit to Stand: +2 physical assistance;Min assist Stand pivot transfers: +2 physical assistance;Min assist       General transfer comment: assist to rise and steady as pt took pivotal steps to chair  Ambulation/Gait                Stairs            Wheelchair Mobility    Modified Rankin (Stroke  Patients Only)       Balance Overall balance assessment: Needs assistance Sitting-balance support: Bilateral upper extremity supported;Feet supported Sitting balance-Leahy Scale: Poor Sitting balance - Comments: pt leaning posterior intermittently   Standing balance support: Bilateral upper extremity supported Standing balance-Leahy Scale: Poor Standing balance comment: relies on bil UE support and assist for postrure for transfer to chair with pt with flexed posture overall.                              Pertinent Vitals/Pain Faces Pain Scale: No hurt    Home Living Family/patient expects to be discharged to:: Private residence Living Arrangements: Spouse/significant other Available Help at Discharge: Family;Available 24 hours/day Type of Home: Other(Comment)(condo) Home Access: Stairs to enter Entrance Stairs-Rails: Right;Left Entrance Stairs-Number of Steps: 3 Home Layout: One level Home Equipment: Walker - 2 wheels;Shower seat;Hand held shower head;Bedside commode;Cane - single point;Grab bars - tub/shower;Wheelchair - manual      Prior Function Level of Independence: Independent with assistive device(s)         Comments: uses BSC over toilet, sits to shower, no device for ambulation     Hand Dominance   Dominant Hand: Right    Extremity/Trunk Assessment   Upper Extremity Assessment Upper Extremity Assessment: Defer to OT evaluation    Lower Extremity Assessment Lower Extremity Assessment: Generalized weakness    Cervical / Trunk Assessment Cervical / Trunk Assessment:  Kyphotic  Communication   Communication: Expressive difficulties  Cognition Arousal/Alertness: Awake/alert(closed eyes) Behavior During Therapy: Flat affect Overall Cognitive Status: Impaired/Different from baseline Area of Impairment: Orientation;Following commands;Safety/judgement;Problem solving;Memory                 Orientation Level: Disoriented  to;Time;Situation   Memory: Decreased short-term memory Following Commands: Follows one step commands with increased time;Follows one step commands inconsistently Safety/Judgement: Decreased awareness of safety;Decreased awareness of deficits   Problem Solving: Slow processing;Decreased initiation;Difficulty sequencing;Requires verbal cues;Requires tactile cues        General Comments      Exercises General Exercises - Lower Extremity Ankle Circles/Pumps: AROM;Both;5 reps;Seated Long Arc Quad: AROM;Both;5 reps;Seated   Assessment/Plan    PT Assessment Patient needs continued PT services  PT Problem List Decreased activity tolerance;Decreased balance;Decreased mobility;Decreased knowledge of use of DME;Decreased safety awareness;Decreased knowledge of precautions       PT Treatment Interventions DME instruction;Gait training;Functional mobility training;Therapeutic activities;Therapeutic exercise;Balance training;Patient/family education    PT Goals (Current goals can be found in the Care Plan section)  Acute Rehab PT Goals Patient Stated Goal: get stronger PT Goal Formulation: With patient Time For Goal Achievement: 02/19/19 Potential to Achieve Goals: Good    Frequency Min 3X/week   Barriers to discharge        Co-evaluation PT/OT/SLP Co-Evaluation/Treatment: Yes Reason for Co-Treatment: Complexity of the patient's impairments (multi-system involvement) PT goals addressed during session: Mobility/safety with mobility         AM-PAC PT "6 Clicks" Mobility  Outcome Measure Help needed turning from your back to your side while in a flat bed without using bedrails?: A Little Help needed moving from lying on your back to sitting on the side of a flat bed without using bedrails?: A Little Help needed moving to and from a bed to a chair (including a wheelchair)?: A Lot Help needed standing up from a chair using your arms (e.g., wheelchair or bedside chair)?: A Lot Help  needed to walk in hospital room?: Total Help needed climbing 3-5 steps with a railing? : Total 6 Click Score: 12    End of Session Equipment Utilized During Treatment: Gait belt;Oxygen Activity Tolerance: Patient limited by fatigue Patient left: in chair;with call bell/phone within reach;with chair alarm set Nurse Communication: Mobility status PT Visit Diagnosis: Unsteadiness on feet (R26.81);Muscle weakness (generalized) (M62.81)    Time: 2952-8413 PT Time Calculation (min) (ACUTE ONLY): 22 min   Charges:   PT Evaluation $PT Eval Moderate Complexity: [place] Pager:  [phone]  Office:  [phone]    Denice Paradise 02/05/2019, 2:10 PM
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
## 2019-02-05 NOTE — Progress Notes (Addendum)
[facility] Progress Note Patient Name: [name] DOB: 1945-10-08 MRN: [id number]   Date of Service  02/05/2019  HPI/Events of Note  RN noted central line lumens are not drawing blood. In this agitated patient, the line may be displaced  eICU Interventions  CXR ordered Requested call back when done      Intervention Category Minor Interventions: Clinical assessment - ordering diagnostic tests  Margaretmary Lombard 02/05/2019, 9:38 PM   10:35 PM Reviewed CXR Line was retracted and is still in central vein  Will place another peripheral IV  RN does not think the line is needed so this can be removed in the AM if continues to remain stable and not needing a line Is on IV heparin so favor pulling the line in the AM as no urgent need to do so

## 2019-02-06 LAB — CBC
HCT: 30.2 % — ABNORMAL LOW (ref 36.0–46.0)
Hemoglobin: 9.8 g/dL — ABNORMAL LOW (ref 12.0–15.0)
MCH: 31.9 pg (ref 26.0–34.0)
MCHC: 32.5 g/dL (ref 30.0–36.0)
MCV: 98.4 fL (ref 80.0–100.0)
Platelets: 164 10*3/uL (ref 150–400)
RBC: 3.07 MIL/uL — ABNORMAL LOW (ref 3.87–5.11)
RDW: 12.7 % (ref 11.5–15.5)
WBC: 7.4 10*3/uL (ref 4.0–10.5)
nRBC: 0.5 % — ABNORMAL HIGH (ref 0.0–0.2)

## 2019-02-06 LAB — HEPARIN LEVEL (UNFRACTIONATED)
Heparin Unfractionated: 0.19 IU/mL — ABNORMAL LOW (ref 0.30–0.70)
Heparin Unfractionated: 0.33 IU/mL (ref 0.30–0.70)
Heparin Unfractionated: 0.21 IU/mL — ABNORMAL LOW (ref 0.30–0.70)

## 2019-02-06 LAB — GLUCOSE, CAPILLARY
Glucose-Capillary: 115 mg/dL — ABNORMAL HIGH (ref 70–99)
Glucose-Capillary: 108 mg/dL — ABNORMAL HIGH (ref 70–99)
Glucose-Capillary: 112 mg/dL — ABNORMAL HIGH (ref 70–99)
Glucose-Capillary: 108 mg/dL — ABNORMAL HIGH (ref 70–99)
Glucose-Capillary: 96 mg/dL (ref 70–99)

## 2019-02-06 LAB — CULTURE, BLOOD (ROUTINE X 2)
Culture: NO GROWTH
Culture: NO GROWTH
Special Requests: ADEQUATE
Special Requests: ADEQUATE

## 2019-02-06 LAB — BASIC METABOLIC PANEL
Anion gap: 9 (ref 5–15)
BUN: 14 mg/dL (ref 8–23)
CO2: 27 mmol/L (ref 22–32)
Calcium: 8.6 mg/dL — ABNORMAL LOW (ref 8.9–10.3)
Chloride: 108 mmol/L (ref 98–111)
Creatinine, Ser: 0.88 mg/dL (ref 0.44–1.00)
GFR calc Af Amer: >60 mL/min (ref >60)
GFR calc non Af Amer: >60 mL/min (ref >60)
Glucose, Bld: 109 mg/dL — ABNORMAL HIGH (ref 70–99)
Potassium: 3.1 mmol/L — ABNORMAL LOW (ref 3.5–5.1)
Sodium: 144 mmol/L (ref 135–145)

## 2019-02-06 LAB — MAGNESIUM: Magnesium: 1.8 mg/dL (ref 1.7–2.4)

## 2019-02-06 MED ORDER — LOSARTAN POTASSIUM 25 MG PO TABS
25.0000 mg | ORAL_TABLET | Freq: Every day | ORAL | Status: DC
  Administered 2019-02-06 – 2019-02-13 (×6): 25 mg via ORAL
  Filled 2019-02-06 (×9): qty 1

## 2019-02-06 MED ORDER — QUETIAPINE FUMARATE 25 MG PO TABS
25.0000 mg | ORAL_TABLET | Freq: Every day | ORAL | Status: DC
  Administered 2019-02-06 – 2019-02-07 (×2): 25 mg via ORAL
  Filled 2019-02-06 (×2): qty 1

## 2019-02-06 MED ORDER — LEVOTHYROXINE SODIUM 88 MCG PO TABS
88.0000 ug | ORAL_TABLET | Freq: Every day | ORAL | Status: DC
  Administered 2019-02-07 – 2019-02-13 (×7): 88 ug via ORAL
  Filled 2019-02-06 (×7): qty 1

## 2019-02-06 MED ORDER — DEXTROSE 5 % IV SOLN
500.0000 mg | Freq: Once | INTRAVENOUS | Status: AC
  Administered 2019-02-06: 500 mg via INTRAVENOUS
  Filled 2019-02-06: qty 500

## 2019-02-06 MED ORDER — MAGNESIUM SULFATE 2 GM/50ML IV SOLN
2.0000 g | Freq: Once | INTRAVENOUS | Status: AC
  Administered 2019-02-06: 2 g via INTRAVENOUS
  Filled 2019-02-06: qty 50

## 2019-02-06 MED ORDER — BUPROPION HCL ER (XL) 150 MG PO TB24
150.0000 mg | ORAL_TABLET | Freq: Every day | ORAL | Status: DC
  Administered 2019-02-06 – 2019-02-13 (×6): 150 mg via ORAL
  Filled 2019-02-06 (×8): qty 1

## 2019-02-06 MED ORDER — FLUOXETINE HCL 20 MG PO CAPS
20.0000 mg | ORAL_CAPSULE | Freq: Every day | ORAL | Status: DC
  Administered 2019-02-06 – 2019-02-08 (×3): 20 mg via ORAL
  Filled 2019-02-06 (×3): qty 1

## 2019-02-06 MED ORDER — DILTIAZEM HCL-DEXTROSE 100-5 MG/100ML-% IV SOLN (PREMIX)
5.0000 mg/h | INTRAVENOUS | Status: DC
  Administered 2019-02-06: 5 mg/h via INTRAVENOUS
  Administered 2019-02-07: 10 mg/h via INTRAVENOUS
  Filled 2019-02-06 (×2): qty 100

## 2019-02-06 MED ORDER — CEFDINIR 300 MG PO CAPS
300.0000 mg | ORAL_CAPSULE | Freq: Two times a day (BID) | ORAL | Status: DC
  Administered 2019-02-06 – 2019-02-09 (×8): 300 mg via ORAL
  Filled 2019-02-06 (×11): qty 1

## 2019-02-06 MED ORDER — ENSURE ENLIVE PO LIQD
237.0000 mL | Freq: Three times a day (TID) | ORAL | Status: DC
  Administered 2019-02-06 – 2019-02-10 (×6): 237 mL via ORAL

## 2019-02-06 MED ORDER — RAMELTEON 8 MG PO TABS
8.0000 mg | ORAL_TABLET | Freq: Every day | ORAL | Status: DC
  Administered 2019-02-06 – 2019-02-07 (×2): 8 mg via ORAL
  Filled 2019-02-06 (×4): qty 1

## 2019-02-06 MED ORDER — ZOLPIDEM TARTRATE 5 MG PO TABS
5.0000 mg | ORAL_TABLET | Freq: Every day | ORAL | Status: DC
  Administered 2019-02-06: 5 mg via ORAL
  Filled 2019-02-06: qty 1

## 2019-02-06 MED ORDER — METHOCARBAMOL 500 MG PO TABS
500.0000 mg | ORAL_TABLET | Freq: Three times a day (TID) | ORAL | Status: DC | PRN
  Administered 2019-02-06: 500 mg via ORAL
  Filled 2019-02-06 (×3): qty 1

## 2019-02-06 MED ORDER — POTASSIUM CHLORIDE 10 MEQ/50ML IV SOLN
10.0000 meq | INTRAVENOUS | Status: AC
  Administered 2019-02-06 (×6): 10 meq via INTRAVENOUS
  Filled 2019-02-06: qty 50

## 2019-02-06 NOTE — Progress Notes (Signed)
Called and updated daughter.  Son in law has been at bedside multiple times today to help re-orient patient.

## 2019-02-06 NOTE — Progress Notes (Signed)
[facility] for Heparin Indication: atrial fibrillation  Allergies  Allergen Reactions  . Penicillins     Causes rash Has patient had a PCN reaction causing immediate rash, facial/tongue/throat swelling, SOB or lightheadedness with hypotension: YES Has patient had a PCN reaction causing severe rash involving mucus membranes or skin necrosis: No Has patient had a PCN reaction that required hospitalization No Has patient had a PCN reaction occurring within the last 10 years: No If all of the above answers are "NO", then may proceed with Cephalosporin use.     Patient Measurements: Height: 5\' 5"  (165.1 cm) Weight: 105 lb 6.1 oz (47.8 kg) IBW/kg (Calculated) : 57 Heparin Dosing Weight: 53.2 kg  Vital Signs: Temp: 98.7 F (37.1 C) (06/18 1917) Temp Source: Oral (06/18 1917) BP: 125/84 (06/18 2000) Pulse Rate: 108 (06/18 2000)  Labs: Recent Labs    02/04/19 0045  02/04/19 0426 02/04/19 1300 02/04/19 2256 02/05/19 0423 02/05/19 0810 02/05/19 1109 02/06/19 0400 02/06/19 0401 02/06/19 1215 02/06/19 2009  HGB  --    < > 11.6*  --   --  10.8*  --   --  9.8*  --   --   --   HCT  --   --  36.2  --   --  33.4*  --   --  30.2*  --   --   --   PLT  --   --  224  --   --  152  --   --  164  --   --   --   APTT  --    < > 72* 65* 119*  --   --  79*  --   --   --   --   LABPROT  --   --  16.3*  --   --   --   --   --   --   --   --   --   INR  --   --  1.3*  --   --   --   --   --   --   --   --   --   HEPARINUNFRC  --    < > 0.71*  --   --   --   --  0.46  --  0.19* 0.33 0.21*  CREATININE 0.82  --   --   --   --   --  0.81  --  0.88  --   --   --    < > = values in this interval not displayed.    Estimated Creatinine Clearance: 43 mL/min (by C-G formula based on SCr of 0.88 mg/dL).   Assessment: 73 y.o. female with h/o Afib, PTA apixaban on hold, for heparin. Hep lvl low this evening  Pt seems to be sensitive to rate changes  Goal of  Therapy:  Heparin level 0.3-0.7 Monitor platelets by anticoagulation protocol: Yes   Plan:  Increase Heparin 1100 units/hr. Daily HL  Levester Fresh, PharmD, BCPS, BCCCP Clinical Pharmacist [phone]  Please check AMION for all [place] numbers  02/06/2019 8:57 PM

## 2019-02-06 NOTE — Progress Notes (Signed)
  Speech Language Pathology Treatment: Dysphagia  Patient Details Name: [name] MRN: [id number] DOB: 1946/05/27 Today's Date: 02/06/2019 Time: 9211-9417 SLP Time Calculation (min) (ACUTE ONLY): 9 min  Assessment / Plan / Recommendation Clinical Impression  Pt tolearting thin liquids without coughing, though frequent belching still observed. Pt very reluctant to accept textured solids, AMS persists. Per NT she spit out dys 2 solids. Max encouragement and cueing needed for her to taste oatmeal. WIll downgrade to puree and thin, though likely liquid nutritional supplements may be her best source of intake. Will f/u 1x a week for readiness to upgrade solids.   HPI HPI: 73 year old female who initially presented on 6/13 with nausea, vomiting and diarrhea and found with profound hypokalemia and hypomagnesemia.  Intubated 6/13-6/16. Acute hypoxemic respiratory failure with combination of fluid overload and CAP, question aspiration although sputum now growing Staph. most recent CT Continued opacity throughout the right lung. There is likely a component of layering effusion. The underlying opacity could represent asymmetric edema, atelectasis, or infiltrate. Pt underwent R thoracentesis on 6/15. Esophageal dilatation in 2000.       SLP Plan  Continue with current plan of care       Recommendations  Diet recommendations: Dysphagia 1 (puree);Thin liquid Liquids provided via: Straw Medication Administration: Whole meds with puree Supervision: Full supervision/cueing for compensatory strategies Compensations: Slow rate;Small sips/bites Postural Changes and/or Swallow Maneuvers: Seated upright 90 degrees;Upright 30-60 min after meal                Follow up Recommendations: Skilled Nursing facility SLP Visit Diagnosis: Dysphagia, unspecified (R13.10) Plan: Continue with current plan of care       GO               Herbie Baltimore, MA [place] Pager  [phone] Office [phone]   Lynann Beaver 02/06/2019, 10:16 AM

## 2019-02-06 NOTE — Progress Notes (Signed)
Called [place], [place], about pt's HR>140. He spoke with Dr. Tamala Julian about the pt. Verbal instructions to continue monitoring pt and do no intervention at this point. They will follow up with her this afternoon.

## 2019-02-06 NOTE — Progress Notes (Signed)
NAME:  [name], MRN:  [id number], DOB:  1946-01-02, LOS: 4 ADMISSION DATE:  02/01/2019, CONSULTATION DATE:  02/01/19 REFERRING MD:  Fuller Plan CHIEF COMPLAINT:  Hypoxemia  Brief History    73 year old female with past medical history below who initially presented with nausea, vomiting and diarrhea and found with profound hypokalemia and hypomagnesemia.  During her hospital course electrolytes were repleted.  Overnight patient was given Ativan and was found to have altered mental statu.  PCCM consulted for hypoxemia.  Past Medical History  Hypertension hyperlipidemia, paraoxysmal atrial fibrillation on Eliquis, CVA, hypothyroidism, COPD, depression, anemia, arthritis, chronic opioid use  Significant Hospital Events   6/13 Admit  6/13 Transferred to ICU 6/14 - RN reports pt with AF, rate up to 140.  Intermittent agitation - given versed x1.  Decreased UOP per RN.  Afebrile.  I/O - 1.8L positive in last 24 hours / 500 UOP  6/16 right thora with 765ml removed. 6/17 SLP eval > dysphagia 2 diet   Consults:  PCCM  Procedures:  ETT 6/13 >> 02/04/2019 L IJ CVC 6/13 >>  Significant Diagnostic Tests:  UDS 6/13 >> positive for opiates  Micro Data:  COVID 6/13 >> negative  Trach aspirate 6/13 >> staph aureus (pansensitive) BCx2 6/13 >> negative  COVID (send out) 6/13 >>  Negative Pleural fluid 6/16 >   Antimicrobials:  Ceftriaxone 6/13 >> 02/03/2019 restarted 02/05/2019 for total 7-day>> 02/03/2019 cefepime>> 02/05/2019 02/04/2019 vancomycin per pharmacy>> 02/05/2019  Interim history/subjective:  No acute events.  Remains encephalopathic and mildly agitated.  Objective   Blood pressure 138/89, pulse (!) 26, temperature 98.1 F (36.7 C), temperature source Oral, resp. rate (!) 24, height 5\' 5"  (1.651 m), weight 47.8 kg, SpO2 90 %.        Intake/Output Summary (Last 24 hours) at 02/06/2019 0804 Last data filed at 02/06/2019 0600 Gross per 24 hour  Intake 711.86 ml  Output 1530  ml  Net -818.14 ml   Filed Weights   02/04/19 0500 02/05/19 0423 02/06/19 0500  Weight: 52 kg 49.9 kg 47.8 kg    General: Frail elderly female, in NAD, is intermittently confused HEENT: No JVD or lymphadenopathy is appreciated Neuro: Moves all extremities but not to command CV: IRIR PULM: Creased breath sounds throughout QQ:VZDG, non-tender, bsx4 active  Extremities: warm/dry, negative edema  Skin: no rashes or lesions  Assessment & Plan:   Acute Hypoxemic Respiratory Failure requiring intubation, s/p extubation 6/16. Staph aureus PNA (pan sensitive) - probable aspiration event. Right transudative pleural effusion - s/p thora 6/16. Former Smoker. - Continue supplemental O2 as needed to maintain SpO2 > 92%. - Continue lasix, goal negative balance. - Continue ceftriaxone. - Bronchial hygiene. - CXR intermittently.  Acute Toxic Metabolic Encephalopathy - secondary to oversedation + sundowning. Chronic Pain / Long Term Narcotic Use.  Hx Depression, RLS, chronic back pain, CVA (2012). - D/c precedex gtt. - Push to optimize day / night cycle. - Continue low-dose fentanyl patch and low dose catapres. - Start 25mg  seroquel QHS. - Continue ropinirole, PRN haldol. - Continue to observe for narcotic withdrawal. - Hold preadmission bupropion, fluoxetine, norco, methocarbamol, zolpidem.  Acute sCHF with EF 20% and takotsubo pattern on echo 6/14. NSTEMI. Hx HTN, HLD,  AF with RVR (on eliquis & cardizem at baseline). - Heart failure following, appreciate the assistance. - Continue IV heparin until she is able to take p.o. Eliquis - Possible cath at some point after stabilized.  Hypokalemia - being repleted. - Continue K repletion. -  Follow BMP.  Hypomagnesemia. - 2g Mag.  At Risk Malnutrition. Hx GERD, Hiatal Hernia, Schatzki's Ring. - Dysphagia 2 diet per SLP recs.  Hx Hypothyroidism. - Continue preadmission synthroid.  Deconditioning - PT / OT recommending CIR.   Best practice:  Diet: Dysphagia 2 diet. Pain/Anxiety/Delirium protocol (if indicated): None. VAP protocol (if indicated): None. DVT prophylaxis: IV heparin gtt  GI prophylaxis: PPI Glucose control: n/a Mobility: BR Code Status: Full Family Communication: Dr. Cyndia Bent updated 6/18 by Dr. Tamala Julian. Disposition: ICU, possible transfer to progressive PM 6/18 if agitation remains controlled.   Montey Hora, [facility] Pager: [phone].  If no answer, [phone] 02/06/2019, 8:41 AM

## 2019-02-06 NOTE — Progress Notes (Signed)
Advanced Heart Failure Rounding Note   Subjective:    Had R thoracentesis on 6/16.   Delirious this am. Was pulling Foley and triple lumen last night. Precedex off. CVP 6   Remains in AF 80s  Objective:   Weight Range:  Vital Signs:   Temp:  [97.3 F (36.3 C)-98.6 F (37 C)] 98.1 F (36.7 C) (06/18 0723) Pulse Rate:  [25-126] 26 (06/18 0600) Resp:  [0-31] 24 (06/18 0600) BP: (97-144)/(66-104) 138/89 (06/18 0721) SpO2:  [82 %-100 %] 90 % (06/18 0600) Weight:  [47.8 kg] 47.8 kg (06/18 0500) Last BM Date: 02/05/19  Weight change: Filed Weights   02/04/19 0500 02/05/19 0423 02/06/19 0500  Weight: 52 kg 49.9 kg 47.8 kg    Intake/Output:   Intake/Output Summary (Last 24 hours) at 02/06/2019 0739 Last data filed at 02/06/2019 0600 Gross per 24 hour  Intake 751.97 ml  Output 1530 ml  Net -778.03 ml     Physical Exam: General:  Chronically ill and thin appearing. Delirious. Will not follow commands HEENT: normal Neck: supple. no JVD. LIJ TLC Carotids 2+ bilat; no bruits. No lymphadenopathy or thryomegaly appreciated. Cor: PMI nondisplaced. Iegular rate & rhythm. No rubs, gallops or murmurs. Lungs: coarse Abdomen: soft, nontender, nondistended. No hepatosplenomegaly. No bruits or masses. Good bowel sounds. Extremities: no cyanosis, clubbing, rash, edema Neuro: Delirious moves all 4   Telemetry:  AF 80s Personally reviewed   Labs: Basic Metabolic Panel: Recent Labs  Lab 02/02/19 0538 02/03/19 0412 02/04/19 0045 02/04/19 0426 02/05/19 0423 02/05/19 0810 02/06/19 0400  NA 137 141 142  --   --  144 144  K 5.1 3.8 3.8  --   --  3.8 3.1*  CL 107 112* 111  --   --  108 108  CO2 23 24 25   --   --  25 27  GLUCOSE 75 100* 117*  --   --  107* 109*  BUN 16 17 20   --   --  16 14  CREATININE 1.12* 0.98 0.82  --   --  0.81 0.88  CALCIUM 9.3 8.6* 8.5*  --   --  8.9 8.6*  MG 2.1 1.8  --  1.9 1.8  --  1.8  PHOS  --  2.0*  --  1.6* 3.7  --   --     Liver Function  Tests: Recent Labs  Lab 02/01/19 0850 02/04/19 0426  AST 49* 123*  ALT 23 231*  ALKPHOS 91 59  BILITOT 1.5* 0.6  PROT 6.4* 4.9*  ALBUMIN 3.9 2.4*   Recent Labs  Lab 02/04/19 0426  LIPASE 23  AMYLASE 13*   No results for input(s): AMMONIA in the last 168 hours.  CBC: Recent Labs  Lab 02/01/19 0850  02/02/19 0538 02/03/19 0412 02/04/19 0426 02/05/19 0423 02/06/19 0400  WBC 10.6*  --  18.0* 17.3* 11.6* 7.2 7.4  NEUTROABS 9.0*  --   --   --  8.0* 5.1  --   HGB 12.3   < > 13.4 11.7* 11.6* 10.8* 9.8*  HCT 36.9   < > 39.7 36.0 36.2 33.4* 30.2*  MCV 94.1  --  94.7 98.1 100.0 98.8 98.4  PLT 277  --  306 265 224 152 164   < > = values in this interval not displayed.    Cardiac Enzymes: Recent Labs  Lab 02/02/19 0157 02/02/19 0538 02/02/19 1027 02/02/19 1719 02/02/19 2152  TROPONINI 2.83* 3.63* 2.97* 1.65* 1.36*  BNP: BNP (last 3 results) No results for input(s): BNP in the last 8760 hours.  ProBNP (last 3 results) No results for input(s): PROBNP in the last 8760 hours.    Other results:  Imaging: Dg Chest 1 View  Result Date: 02/05/2019 CLINICAL DATA:  Displacement of central venous catheter EXAM: CHEST  1 VIEW COMPARISON:  02/05/2019 FINDINGS: Left central line has been retracted with the tip near the confluence of the innominate veins. Cardiomegaly. Continued diffuse airspace disease throughout the right lung and left lower lung with small bilateral effusions. IMPRESSION: Left central line has been retracted with the tip now at the confluence of the innominate veins. Otherwise no change. Electronically Signed   By: Rolm Baptise M.D.   On: 02/05/2019 22:02   Dg Chest Port 1 View  Result Date: 02/05/2019 CLINICAL DATA:  Status post thoracentesis EXAM: PORTABLE CHEST 1 VIEW COMPARISON:  February 04, 2019 FINDINGS: The left central line is stable. The ET tube is been removed. Stable cardiomegaly. The hila and mediastinum are unremarkable. No pneumothorax. No  nodules or masses. Continued opacity throughout the right lung, likely layering effusion with underlying opacity, either atelectasis or infiltrate. Asymmetric edema is also possible. Retrocardiac opacity on the left with a probable small left effusion is stable. IMPRESSION: 1. The ET tube is been removed.  The left central line is stable. 2. Continued opacity throughout the right lung. There is likely a component of layering effusion. The underlying opacity could represent asymmetric edema, atelectasis, or infiltrate. 3. Small effusion and underlying atelectasis on the left. Electronically Signed   By: Dorise Bullion III M.D   On: 02/05/2019 08:15     Medications:     Scheduled Medications: . atorvastatin  10 mg Oral Daily  . Chlorhexidine Gluconate Cloth  6 each Topical Daily  . cloNIDine  0.1 mg Transdermal Weekly  . digoxin  0.125 mg Oral Daily  . fentaNYL  1 patch Transdermal Q72H  . furosemide  40 mg Oral Daily  . insulin aspart  0-15 Units Subcutaneous Q4H  . levothyroxine  88 mcg Per Tube Daily  . mouth rinse  15 mL Mouth Rinse BID  . sodium chloride flush  10-40 mL Intracatheter Q12H  . spironolactone  12.5 mg Oral Daily    Infusions: . sodium chloride    . cefTRIAXone (ROCEPHIN)  IV Stopped (02/05/19 1031)  . dexmedetomidine (PRECEDEX) IV infusion 0.8 mcg/kg/hr (02/06/19 0600)  . feeding supplement (VITAL AF 1.2 CAL) 1,000 mL (02/03/19 1100)  . heparin 1,000 Units/hr (02/06/19 0600)  . potassium chloride      PRN Medications: sodium chloride, acetaminophen **OR** acetaminophen, albuterol, fluticasone, haloperidol lactate, naLOXone (NARCAN)  injection, ondansetron **OR** ondansetron (ZOFRAN) IV, rOPINIRole, sodium chloride flush   Assessment/plan :   1. Acute on chronic hypoxic respiratory failure in setting of aspiration PNA - has severe underlying COPD on CXR. Per chart quit tobacco in 2001. No PFTs on chart - admitted with severe R-sided PNA likely due to aspiration.  - intubated 6/13. Extubated 6/16 - improved after R thors 6/16 (transudate)  - CCM managing.  - Keep dry with diuretics. CVP 6 Continue lasix 40 daily   2. Chronic AF with RVR - rate improved - off diltiazem due to low EF - can use amio for rate control as needed - Eliquis switched to heparin pending possible cath  3. Acute systolic HF - echo from 4/09 shows EF 20% with Tako-tsubo pattern. Suspect stress CM -  Will add low-dose  losartan - Continue low-dose spiro and digoxin and po lasix - Will need cath this admitwhen MS clears  4. NSTEMI - Troponin elevated in 2-3 range. Now coming down. No CP ECG with new iRBBB and mild lateral TW changes - suspect demand ischemia in setting of severe medical illness.  - however echo from 6/14 suggestive of Tako-Tsubo EF 20% - continue heparin, ASA, statin - probable cath as she recovers.  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 Hypokalemia/hypomag - will supp  6. Chronic GI issues and malnutrition - now with aspiration PNA  7. Acute ICU delirium - per primary team  Length of Stay: 4   Katie Bickers  MD 02/06/2019, 7:39 AM  Advanced Heart Failure Team Pager [phone] (M-F; [place])  Please contact [facility] for night-coverage after hours (4p -7a ) and weekends on [email or web address]

## 2019-02-06 NOTE — Progress Notes (Signed)
[place] for Heparin Indication: atrial fibrillation  Allergies  Allergen Reactions  . Penicillins     Causes rash Has patient had a PCN reaction causing immediate rash, facial/tongue/throat swelling, SOB or lightheadedness with hypotension: YES Has patient had a PCN reaction causing severe rash involving mucus membranes or skin necrosis: No Has patient had a PCN reaction that required hospitalization No Has patient had a PCN reaction occurring within the last 10 years: No If all of the above answers are "NO", then may proceed with Cephalosporin use.     Patient Measurements: Height: 5\' 5"  (165.1 cm) Weight: 110 lb 0.2 oz (49.9 kg) IBW/kg (Calculated) : 57 Heparin Dosing Weight: 53.2 kg  Vital Signs: Temp: 98.2 F (36.8 C) (06/18 0400) Temp Source: Oral (06/18 0400) BP: 126/83 (06/18 0430) Pulse Rate: 83 (06/18 0430)  Labs: Recent Labs    02/04/19 0045  02/04/19 0426 02/04/19 1300 02/04/19 2256 02/05/19 0423 02/05/19 0810 02/05/19 1109 02/06/19 0400 02/06/19 0401  HGB  --    < > 11.6*  --   --  10.8*  --   --  9.8*  --   HCT  --   --  36.2  --   --  33.4*  --   --  30.2*  --   PLT  --   --  224  --   --  152  --   --  164  --   APTT  --   --  72* 65* 119*  --   --  79*  --   --   LABPROT  --   --  16.3*  --   --   --   --   --   --   --   INR  --   --  1.3*  --   --   --   --   --   --   --   HEPARINUNFRC  --   --  0.71*  --   --   --   --  0.46  --  0.19*  CREATININE 0.82  --   --   --   --   --  0.81  --   --   --    < > = values in this interval not displayed.    Estimated Creatinine Clearance: 48.7 mL/min (by C-G formula based on SCr of 0.81 mg/dL).   Assessment: 73 y.o. female with h/o Afib, Eliquis on hold, for heparin  Goal of Therapy:  Heparin level 0.3-0.7 Monitor platelets by anticoagulation protocol: Yes   Plan:  Increase Heparin 1000 units/hr Check heparin level in 8 hours.   Phillis Knack, PharmD, BCPS   -02/06/2019 4:41 AM

## 2019-02-06 NOTE — Progress Notes (Signed)
[name] notified of increase in heart rate 130-148 will continue to monitor no orders a this time.

## 2019-02-06 NOTE — Progress Notes (Addendum)
Nutrition Follow-up RD working remotely.  DOCUMENTATION CODES:   Underweight  INTERVENTION:    Offer Ensure Enlive po TID, each supplement provides 350 kcal and 20 grams of protein   Magic cup and Vital Cuisine shakes on meal trays. Magic cup provides 290 kcal and 9 grams of protein. Vital Cuisine Shake provides 520 kcal and 22 grams of protein.  NUTRITION DIAGNOSIS:   Inadequate oral intake related to dysphagia as evidenced by meal completion < 25%.  Ongoing   GOAL:   Patient will meet greater than or equal to 90% of their needs  Unmet  MONITOR:   PO intake, Supplement acceptance, Diet advancement, Labs, Skin  ASSESSMENT:   73 yo female admitted with N/V/D with profound hypokalemia & hypomagnesemia. Developed AMS and required intubation on 6/13. PMH of HTN, HLD, PAF, CVA, COPD, anemia, chronic opioid use.  Extubated on 6/16. TF off since extubation.  SLP following for dysphagia. Diet changed to dysphagia 1 with thin liquids today. Patient has been consuming </= 25% of dysphagia 2 with thin liquid meals.  Labs reviewed. Potassium 3.1 (L), magnesium 1.8 WNL, phosphorus 3.7 WNL 6/17 CBG's: 108-112  Medications reviewed and include Lasix, Novolog, spironolactone, IV KCl.  Weight down 5.4 kg since admission I/O net positive 5.2 L  Diet Order:   Diet Order            DIET - DYS 1 Room service appropriate? Yes; Fluid consistency: Thin  Diet effective now              EDUCATION NEEDS:   No education needs have been identified at this time  Skin:  Skin Assessment: Reviewed RN Assessment  Last BM:  6/17  Height:   Ht Readings from Last 1 Encounters:  02/02/19 5\' 5"  (1.651 m)    Weight:   Wt Readings from Last 1 Encounters:  02/06/19 47.8 kg    Ideal Body Weight:  56.8 kg  BMI:  Body mass index is 17.54 kg/m.  Estimated Nutritional Needs:   Kcal:  1400-1600  Protein:  70-85 gm  Fluid:  >/= 1.5 L    Molli Barrows, RD, LDN, [place] Pager  [phone] After Hours Pager [phone]

## 2019-02-06 NOTE — Progress Notes (Signed)
[facility] Progress Note Patient Name: [name] DOB: 08-09-1946 MRN: [id number]   Date of Service  02/06/2019  HPI/Events of Note  AFIB with RVR - Ventricular rate = 120's to 150's.  eICU Interventions  Will order: 1. Cardizem IV infusion.  2. BMP and Mg++ level STAT.     Intervention Category Major Interventions: Arrhythmia - evaluation and management  Audyn Dimercurio Eugene 02/06/2019, 10:41 PM

## 2019-02-06 NOTE — Progress Notes (Signed)
Patient is spitting out medications even when put in apple sauce.

## 2019-02-06 NOTE — Progress Notes (Signed)
[facility] for Heparin Indication: atrial fibrillation  Allergies  Allergen Reactions  . Penicillins     Causes rash Has patient had a PCN reaction causing immediate rash, facial/tongue/throat swelling, SOB or lightheadedness with hypotension: YES Has patient had a PCN reaction causing severe rash involving mucus membranes or skin necrosis: No Has patient had a PCN reaction that required hospitalization No Has patient had a PCN reaction occurring within the last 10 years: No If all of the above answers are "NO", then may proceed with Cephalosporin use.     Patient Measurements: Height: 5\' 5"  (165.1 cm) Weight: 105 lb 6.1 oz (47.8 kg) IBW/kg (Calculated) : 57 Heparin Dosing Weight: 53.2 kg  Vital Signs: Temp: 98.2 F (36.8 C) (06/18 1100) Temp Source: Axillary (06/18 1100) BP: 119/82 (06/18 0907) Pulse Rate: 85 (06/18 1044)  Labs: Recent Labs    02/04/19 0045  02/04/19 0426 02/04/19 1300 02/04/19 2256 02/05/19 0423 02/05/19 0810 02/05/19 1109 02/06/19 0400 02/06/19 0401 02/06/19 1215  HGB  --    < > 11.6*  --   --  10.8*  --   --  9.8*  --   --   HCT  --   --  36.2  --   --  33.4*  --   --  30.2*  --   --   PLT  --   --  224  --   --  152  --   --  164  --   --   APTT  --   --  72* 65* 119*  --   --  79*  --   --   --   LABPROT  --   --  16.3*  --   --   --   --   --   --   --   --   INR  --   --  1.3*  --   --   --   --   --   --   --   --   HEPARINUNFRC  --    < > 0.71*  --   --   --   --  0.46  --  0.19* 0.33  CREATININE 0.82  --   --   --   --   --  0.81  --  0.88  --   --    < > = values in this interval not displayed.    Estimated Creatinine Clearance: 43 mL/min (by C-G formula based on SCr of 0.88 mg/dL).   Assessment: 73 y.o. female with h/o Afib, PTA apixaban on hold, for heparin. Heparin level now therapeutic.  Goal of Therapy:  Heparin level 0.3-0.7 Monitor platelets by anticoagulation protocol: Yes   Plan:   Continue Heparin IV 1000 units/hr. Check heparin level in 8 hours. Monitor for signs/symptoms of bleeding. Follow up transition to oral anticoagulation when able.   Vassie Moselle, PharmD Candidate -02/06/2019 2:02 PM

## 2019-02-06 NOTE — Progress Notes (Signed)
Attempted to given PO meds again, patient again spit out meds. Unknown as to what made it down and what was spit out.

## 2019-02-07 LAB — HEPARIN LEVEL (UNFRACTIONATED)
Heparin Unfractionated: 0.34 IU/mL (ref 0.30–0.70)
Heparin Unfractionated: 0.23 IU/mL — ABNORMAL LOW (ref 0.30–0.70)
Heparin Unfractionated: 0.36 IU/mL (ref 0.30–0.70)

## 2019-02-07 LAB — BASIC METABOLIC PANEL
Anion gap: 13 (ref 5–15)
Anion gap: 13 (ref 5–15)
BUN: 14 mg/dL (ref 8–23)
BUN: 12 mg/dL (ref 8–23)
CO2: 26 mmol/L (ref 22–32)
CO2: 29 mmol/L (ref 22–32)
Calcium: 9.4 mg/dL (ref 8.9–10.3)
Calcium: 9.5 mg/dL (ref 8.9–10.3)
Chloride: 102 mmol/L (ref 98–111)
Chloride: 100 mmol/L (ref 98–111)
Creatinine, Ser: 0.91 mg/dL (ref 0.44–1.00)
Creatinine, Ser: 0.79 mg/dL (ref 0.44–1.00)
GFR calc Af Amer: >60 mL/min (ref >60)
GFR calc Af Amer: >60 mL/min (ref >60)
GFR calc non Af Amer: >60 mL/min (ref >60)
GFR calc non Af Amer: >60 mL/min (ref >60)
Glucose, Bld: 112 mg/dL — ABNORMAL HIGH (ref 70–99)
Glucose, Bld: 110 mg/dL — ABNORMAL HIGH (ref 70–99)
Potassium: 3.4 mmol/L — ABNORMAL LOW (ref 3.5–5.1)
Potassium: 3.6 mmol/L (ref 3.5–5.1)
Sodium: 141 mmol/L (ref 135–145)
Sodium: 142 mmol/L (ref 135–145)

## 2019-02-07 LAB — CBC
HCT: 38.6 % (ref 36.0–46.0)
Hemoglobin: 12.8 g/dL (ref 12.0–15.0)
MCH: 31.9 pg (ref 26.0–34.0)
MCHC: 33.2 g/dL (ref 30.0–36.0)
MCV: 96.3 fL (ref 80.0–100.0)
Platelets: 250 10*3/uL (ref 150–400)
RBC: 4.01 MIL/uL (ref 3.87–5.11)
RDW: 13.3 % (ref 11.5–15.5)
WBC: 9.3 10*3/uL (ref 4.0–10.5)
nRBC: 0 % (ref 0.0–0.2)

## 2019-02-07 LAB — BODY FLUID CULTURE: Culture: NO GROWTH

## 2019-02-07 LAB — GLUCOSE, CAPILLARY
Glucose-Capillary: 101 mg/dL — ABNORMAL HIGH (ref 70–99)
Glucose-Capillary: 110 mg/dL — ABNORMAL HIGH (ref 70–99)
Glucose-Capillary: 116 mg/dL — ABNORMAL HIGH (ref 70–99)
Glucose-Capillary: 117 mg/dL — ABNORMAL HIGH (ref 70–99)
Glucose-Capillary: 92 mg/dL (ref 70–99)
Glucose-Capillary: 93 mg/dL (ref 70–99)

## 2019-02-07 LAB — MAGNESIUM
Magnesium: 2.1 mg/dL (ref 1.7–2.4)
Magnesium: 2 mg/dL (ref 1.7–2.4)

## 2019-02-07 MED ORDER — BISOPROLOL FUMARATE 5 MG PO TABS
2.5000 mg | ORAL_TABLET | Freq: Every day | ORAL | Status: DC
  Administered 2019-02-07 – 2019-02-13 (×5): 2.5 mg via ORAL
  Filled 2019-02-07: qty 1
  Filled 2019-02-07 (×2): qty 0.5
  Filled 2019-02-07 (×7): qty 1

## 2019-02-07 MED ORDER — METOPROLOL TARTRATE 5 MG/5ML IV SOLN
2.5000 mg | Freq: Four times a day (QID) | INTRAVENOUS | Status: DC | PRN
  Administered 2019-02-07: 2.5 mg via INTRAVENOUS
  Filled 2019-02-07: qty 5

## 2019-02-07 MED ORDER — BISOPROLOL FUMARATE 5 MG PO TABS
2.5000 mg | ORAL_TABLET | Freq: Every day | ORAL | Status: DC
  Filled 2019-02-07: qty 0.5

## 2019-02-07 MED ORDER — POTASSIUM CHLORIDE CRYS ER 20 MEQ PO TBCR
40.0000 meq | EXTENDED_RELEASE_TABLET | Freq: Once | ORAL | Status: AC
  Administered 2019-02-07: 40 meq via ORAL
  Filled 2019-02-07: qty 2

## 2019-02-07 NOTE — Progress Notes (Signed)
02/07/2019 1300 Received pt to room 4E-14 from 79M.  Pt is Alert to speech.  No C/O voiced.  Tele monitor applied and CCMD notified.  CHG bath given.  Oriented to room, call light and bed. Call bell in reach and sitter at bedside. [place]

## 2019-02-07 NOTE — Progress Notes (Addendum)
NAME:  [name], MRN:  [id number], DOB:  10/04/1945, LOS: 5 ADMISSION DATE:  02/01/2019, CONSULTATION DATE:  02/01/19 REFERRING MD:  Fuller Plan CHIEF COMPLAINT:  Hypoxemia  Brief History    73 year old female with past medical history below who initially presented with nausea, vomiting and diarrhea and found with profound hypokalemia and hypomagnesemia.  During her hospital course electrolytes were repleted.  Overnight patient was given Ativan and was found to have altered mental statu.  PCCM consulted for hypoxemia.  Past Medical History  Hypertension hyperlipidemia, paraoxysmal atrial fibrillation on Eliquis, CVA, hypothyroidism, COPD, depression, anemia, arthritis, chronic opioid use  Significant Hospital Events   6/13 Admit  6/13 Transferred to ICU 6/14 - RN reports pt with AF, rate up to 140.  Intermittent agitation - given versed x1.  Decreased UOP per RN.  Afebrile.  I/O - 1.8L positive in last 24 hours / 500 UOP  6/16 right thora with 711ml removed. 6/17 SLP eval > dysphagia 2 diet 6/18 > A fib with RVR>> Cardizem IV 6/19>>     Consults:  PCCM  Procedures:  ETT 6/13 >> 02/04/2019 L IJ CVC 6/13 >>  Significant Diagnostic Tests:  UDS 6/13 >> positive for opiates  Micro Data:  COVID 6/13 >> negative  Trach aspirate 6/13 >> staph aureus (pansensitive) BCx2 6/13 >> negative  COVID (send out) 6/13 >>  Negative Pleural fluid 6/16 >   Antimicrobials:  Ceftriaxone 6/13 >> 02/03/2019 restarted 02/05/2019 for total 7-day>> 02/03/2019 cefepime>> 02/05/2019 02/04/2019 vancomycin per pharmacy>> 02/05/2019  Interim history/subjective:  No acute events.  Remains encephalopathic and mildly agitated. She is very sleepy 6/19 am Poor appetite  Objective   Blood pressure 114/76, pulse 75, temperature 98.4 F (36.9 C), temperature source Axillary, resp. rate 20, height 5\' 5"  (1.651 m), weight 47.6 kg, SpO2 100 %.        Intake/Output Summary (Last 24 hours) at 02/07/2019 0857  Last data filed at 02/07/2019 0800 Gross per 24 hour  Intake 950.53 ml  Output 1870 ml  Net -919.47 ml   Filed Weights   02/05/19 0423 02/06/19 0500 02/07/19 0500  Weight: 49.9 kg 47.8 kg 47.6 kg    General: Frail elderly female, in NAD, is intermittently confused, somnolent HEENT: No JVD or lymphadenopathy is appreciated Neuro: Moves all extremities but not to command, follows few commands CV: IRIR, S1, S2, RRR, No RMG, rate controlled a fib per tele PULM: Bilateral chest excursion, clear, diminished per bases WU:JWJX, non-tender,ND,  bsx4 active  Extremities: warm/dry, negative edema, no obvious deformities  Skin: no rashes or lesions, warm, dry and intact  Assessment & Plan:   Acute Hypoxemic Respiratory Failure requiring intubation, s/p extubation 6/16. Staph aureus PNA (pan sensitive) - probable aspiration event. Right transudative pleural effusion - s/p thora 6/16. Former Smoker. Currently on RA - Continue supplemental O2 as needed to maintain SpO2 > 92%. - Continue lasix, goal negative balance. - Continue omnicef - Bronchial hygiene. - Aggressive pulmonary toilet - CXR intermittently.  Acute Toxic Metabolic Encephalopathy - secondary to oversedation + sundowning. Chronic Pain / Long Term Narcotic Use.  Hx Depression, RLS, chronic back pain, CVA (2012). - D/c precedex gtt. - Push to optimize day / night cycle. - Continue low-dose fentanyl patch and low dose catapres. - Start 25mg  seroquel QHS. - will stop Ambien as patient is very sleepy 6/19 am  - Continue ropinirole, PRN haldol. - Continue to observe for narcotic withdrawal. - Hold preadmission bupropion, fluoxetine, norco, methocarbamol, zolpidem.  Acute sCHF with EF 20% and takotsubo pattern on echo 6/14. NSTEMI. Hx HTN, HLD,  AF with RVR (on eliquis & cardizem at baseline). - Heart failure following, appreciate the assistance. - Continue IV heparin until she is able to take p.o. Eliquis - Possible cath at  some point after stabilized. - Continue lasix with net negative goal - plan to wean off cardizem gtt today and restart po dosing - Will get EKG (patient is on several potential QTc prolonging medications) - Bedside QTc monitoring ( Pt, is in fib, hard to calculate by hand)  Hypokalemia - being repleted. - Continue K repletion. - Follow BMP. - Goal > 4.0   Hypomagnesemia. - 2g Mag. - Goal is > 2.0  At Risk Malnutrition. Hx GERD, Hiatal Hernia, Schatzki's Ring. - Dysphagia 2 diet per SLP recs.  Hx Hypothyroidism. - Continue preadmission synthroid.  Deconditioning - PT / OT recommending CIR.  Plan to transfer to 4E  Today ( 6/19) Will need to wean off cardizem gtt prior to transfer    CC APP time 30 minutes  Best practice:  Diet: Dysphagia 2 diet. Pain/Anxiety/Delirium protocol (if indicated): None. VAP protocol (if indicated): None. DVT prophylaxis: IV heparin gtt  GI prophylaxis: PPI Glucose control: n/a Mobility: BR Code Status: Full Family Communication: Dr. Cyndia Bent updated 6/18 by Dr. Tamala Julian. Disposition: ICU, possible transfer to progressive PM 6/18 if agitation remains controlled.   Magdalen Spatz, MSN, [facility] Pager: [phone].  If no answer, [phone] 02/07/2019, 8:57 AM

## 2019-02-07 NOTE — Progress Notes (Signed)
[facility] for Heparin Indication: atrial fibrillation  Allergies  Allergen Reactions  . Penicillins     Causes rash Has patient had a PCN reaction causing immediate rash, facial/tongue/throat swelling, SOB or lightheadedness with hypotension: YES Has patient had a PCN reaction causing severe rash involving mucus membranes or skin necrosis: No Has patient had a PCN reaction that required hospitalization No Has patient had a PCN reaction occurring within the last 10 years: No If all of the above answers are "NO", then may proceed with Cephalosporin use.     Patient Measurements: Height: 5\' 5"  (165.1 cm) Weight: 104 lb 15 oz (47.6 kg) IBW/kg (Calculated) : 57 Heparin Dosing Weight: 53.2 kg  Vital Signs: Temp: 98.4 F (36.9 C) (06/19 0724) Temp Source: Axillary (06/19 0724) BP: 114/76 (06/19 0830) Pulse Rate: 75 (06/19 0800)  Labs: Recent Labs    02/04/19 1300 02/04/19 2256  02/05/19 0423  02/05/19 1109 02/06/19 0400  02/06/19 1215 02/06/19 2009 02/07/19 0053 02/07/19 0632  HGB  --   --    < > 10.8*  --   --  9.8*  --   --   --   --  12.8  HCT  --   --   --  33.4*  --   --  30.2*  --   --   --   --  38.6  PLT  --   --   --  152  --   --  164  --   --   --   --  250  APTT 65* 119*  --   --   --  79*  --   --   --   --   --   --   HEPARINUNFRC  --   --   --   --   --  0.46  --    < > 0.33 0.21*  --  0.34  CREATININE  --   --   --   --    < >  --  0.88  --   --   --  0.91 0.79   < > = values in this interval not displayed.    Estimated Creatinine Clearance: 47.1 mL/min (by C-G formula based on SCr of 0.79 mg/dL).   Assessment: 73 y.o. female with h/o Afib, PTA apixaban on hold, for heparin. Hep level 6/18 PM was low at 0.21, therapeutic this AM at 0.34 after rate increase.  Pt seems to be sensitive to rate changes.  Hgb 12.8 today up from 9.8. Pltc remains wnl. No overt bleeding noted.   Goal of Therapy:  Heparin level  0.3-0.7 Monitor platelets by anticoagulation protocol: Yes   Plan:  Continue heparin IV 1100 units/hr. Check confirmatory heparin level in 8 hours. Continue to watch Hgb and pltc. Monitor signs/symptoms of bleeding.  Vassie Moselle, PharmD candidate 02/07/2019 9:01 AM

## 2019-02-07 NOTE — Plan of Care (Signed)
POC in place and progressing 

## 2019-02-07 NOTE — Progress Notes (Signed)
[facility] for Heparin Indication: atrial fibrillation  Allergies  Allergen Reactions  . Penicillins     Causes rash Has patient had a PCN reaction causing immediate rash, facial/tongue/throat swelling, SOB or lightheadedness with hypotension: YES Has patient had a PCN reaction causing severe rash involving mucus membranes or skin necrosis: No Has patient had a PCN reaction that required hospitalization No Has patient had a PCN reaction occurring within the last 10 years: No If all of the above answers are "NO", then may proceed with Cephalosporin use.     Patient Measurements: Height: 5\' 5"  (165.1 cm) Weight: 104 lb 15 oz (47.6 kg) IBW/kg (Calculated) : 57 Heparin Dosing Weight: 53.2 kg  Vital Signs: Temp: 97.6 F (36.4 C) (06/19 1420) Temp Source: Oral (06/19 1419) BP: 113/80 (06/19 1256) Pulse Rate: 61 (06/19 1256)  Labs: Recent Labs    02/04/19 2256  02/05/19 0423  02/05/19 1109 02/06/19 0400  02/06/19 2009 02/07/19 0053 02/07/19 0632 02/07/19 1446  HGB  --    < > 10.8*  --   --  9.8*  --   --   --  12.8  --   HCT  --   --  33.4*  --   --  30.2*  --   --   --  38.6  --   PLT  --   --  152  --   --  164  --   --   --  250  --   APTT 119*  --   --   --  79*  --   --   --   --   --   --   HEPARINUNFRC  --   --   --   --  0.46  --    < > 0.21*  --  0.34 0.23*  CREATININE  --   --   --    < >  --  0.88  --   --  0.91 0.79  --    < > = values in this interval not displayed.    Estimated Creatinine Clearance: 47.1 mL/min (by C-G formula based on SCr of 0.79 mg/dL).   Assessment: 73 y.o. female with h/o Afib, PTA apixaban on hold, for heparin. Pt seems to be sensitive to rate changes.  Heparin level came back slightly subtherapeutic at 0.23, on 1100 units/hr. Hgb 12.8, plt remains WNL. No overt bleeding noted.  Goal of Therapy:  Heparin level 0.3-0.7 Monitor platelets by anticoagulation protocol: Yes   Plan:  Continue  heparin IV 1200 units/hr. Check confirmatory heparin level in 8 hours. Continue to watch Hgb and pltc. Monitor signs/symptoms of bleeding.  Antonietta Jewel, PharmD, [place] Clinical Pharmacist  Pager: [phone] Phone: [phone] 02/07/2019 3:13 PM

## 2019-02-07 NOTE — Progress Notes (Signed)
[place] for Heparin Indication: atrial fibrillation  Allergies  Allergen Reactions  . Penicillins     Causes rash Has patient had a PCN reaction causing immediate rash, facial/tongue/throat swelling, SOB or lightheadedness with hypotension: YES Has patient had a PCN reaction causing severe rash involving mucus membranes or skin necrosis: No Has patient had a PCN reaction that required hospitalization No Has patient had a PCN reaction occurring within the last 10 years: No If all of the above answers are "NO", then may proceed with Cephalosporin use.     Patient Measurements: Height: 5\' 5"  (165.1 cm) Weight: 104 lb 15 oz (47.6 kg) IBW/kg (Calculated) : 57 Heparin Dosing Weight: 53.2 kg  Vital Signs: Temp: 98.5 F (36.9 C) (06/19 1936) Temp Source: Oral (06/19 1936) BP: 122/73 (06/19 1936) Pulse Rate: 75 (06/19 1936)  Labs: Recent Labs    02/05/19 0423  02/05/19 1109 02/06/19 0400  02/07/19 0053 02/07/19 0632 02/07/19 1446 02/07/19 2233  HGB 10.8*  --   --  9.8*  --   --  12.8  --   --   HCT 33.4*  --   --  30.2*  --   --  38.6  --   --   PLT 152  --   --  164  --   --  250  --   --   APTT  --   --  79*  --   --   --   --   --   --   HEPARINUNFRC  --   --  0.46  --    < >  --  0.34 0.23* 0.36  CREATININE  --    < >  --  0.88  --  0.91 0.79  --   --    < > = values in this interval not displayed.    Estimated Creatinine Clearance: 47.1 mL/min (by C-G formula based on SCr of 0.79 mg/dL).   Assessment: 73 y.o. female with h/o Afib, apixaban on hold, for heparin.   Goal of Therapy:  Heparin level 0.3-0.7 Monitor platelets by anticoagulation protocol: Yes   Plan:  Continue Heparin at current rate   Phillis Knack, PharmD, BCPS  02/07/2019 11:14 PM

## 2019-02-07 NOTE — Progress Notes (Signed)
Physical Therapy Treatment Patient Details Name: [name] MRN: [id number] DOB: 04/01/1946 Today's Date: 02/07/2019    History of Present Illness Pt is a 73 year old woman admitted 02/01/19 with acute on chronic respiratory failure, profound hypokalemia and hypomagnsemia. Intubated 6/13-6/16. Thoracentesis 6/15. PMH: COPD, afib wit RR, CHF, chronic GI issues and malnutrition, CKD.     PT Comments    Pt was able to perk up and become more alert once sitting EOB.  Cognition improving, but she remains very weak requiring one person mod assist to stand and transfer to chair with RW.  She will need chair to follow to safely progress to gait training.  Post acute rehab remains appropriate.  PT will continue to follow acutely for safe mobility progression  Follow Up Recommendations  CIR;Supervision/Assistance - 24 hour     Equipment Recommendations  None recommended by PT    Recommendations for Other Services   NA     Precautions / Restrictions Precautions Precautions: Fall Precaution Comments: flexiseal    Mobility  Bed Mobility Overal bed mobility: Needs Assistance Bed Mobility: Supine to Sit     Supine to sit: Mod assist;HOB elevated     General bed mobility comments: Mod assist to support trunk with HOB elevated.  Pt needed help initiating movement of legs to EOB and supporting trunk to come to sittting.   Transfers Overall transfer level: Needs assistance Equipment used: Rolling walker (2 wheeled) Transfers: Sit to/from [facility] Sit to Stand: Mod assist;From elevated surface Stand pivot transfers: Mod assist;From elevated surface       General transfer comment: Mod assist to stand to RW to support trunk for balance and to assist with powering up to standing.  PT stabilized RW and had pt put both hands to RW to pull to standing as I don't think she could transition hands from the bed right now.  Mod assist to support trunk over shakey (and valgus on  R) legs to take pivotal steps to the recliner chair.  Uncontrolled descent to sit down.   Ambulation/Gait             General Gait Details: would be safer with chair to follow and second person.           Balance Overall balance assessment: Needs assistance Sitting-balance support: Feet supported;Bilateral upper extremity supported Sitting balance-Leahy Scale: Poor Sitting balance - Comments: min assist EOB to support trunk.    Standing balance support: Bilateral upper extremity supported Standing balance-Leahy Scale: Poor Standing balance comment: Mod assist with RW in standing.                             Cognition Arousal/Alertness: Lethargic Behavior During Therapy: WFL for tasks assessed/performed Overall Cognitive Status: Impaired/Different from baseline Area of Impairment: Orientation;Following commands;Safety/judgement;Awareness;Problem solving                 Orientation Level: Disoriented to;Time   Memory: Decreased recall of precautions Following Commands: Follows one step commands with increased time Safety/Judgement: Decreased awareness of safety;Decreased awareness of deficits Awareness: Emergent Problem Solving: Slow processing;Difficulty sequencing;Requires verbal cues;Requires tactile cues;Decreased initiation General Comments: Pt quite sleepy initially until EOB and then she perked up and was more aroused and participative in our session.  Cognition is improving, but still has some significant safety issues (letting go of RW mid transfer), and continues to be a bit slow to process.  Pertinent Vitals/Pain Pain Assessment: No/denies pain       Prior Function            PT Goals (current goals can now be found in the care plan section) Acute Rehab PT Goals Patient Stated Goal: get stronger Progress towards PT goals: Progressing toward goals    Frequency    Min 3X/week      PT Plan Current plan remains  appropriate       AM-PAC PT "6 Clicks" Mobility   Outcome Measure  Help needed turning from your back to your side while in a flat bed without using bedrails?: A Little Help needed moving from lying on your back to sitting on the side of a flat bed without using bedrails?: A Lot Help needed moving to and from a bed to a chair (including a wheelchair)?: A Lot Help needed standing up from a chair using your arms (e.g., wheelchair or bedside chair)?: A Lot Help needed to walk in hospital room?: A Lot Help needed climbing 3-5 steps with a railing? : Total 6 Click Score: 12    End of Session Equipment Utilized During Treatment: Gait belt Activity Tolerance: Patient limited by fatigue Patient left: in chair;with nursing/sitter in room Nurse Communication: Mobility status PT Visit Diagnosis: Unsteadiness on feet (R26.81);Muscle weakness (generalized) (M62.81)     Time: 1539-1600 PT Time Calculation (min) (ACUTE ONLY): 21 min  Charges:  $Therapeutic Activity: 8-22 mins                    Shantavia Jha B. Freddi Forster, PT, DPT  Acute Rehabilitation [phone] pager [phone] office  @ [facility]: [phone]   02/07/2019, 6:22 PM

## 2019-02-07 NOTE — Progress Notes (Signed)
Advanced Heart Failure Rounding Note   Subjective:    Had R thoracentesis on 6/16.   Was much more alert yesterday. Got ambien overnight and now sleepy but awakens and follows commands.   AF rate shot up overnight into 110-120 range. No ow IV diltiazem.   Denies SOB, orthopnea or PND. No CP or palpitations   Objective:   Weight Range:  Vital Signs:   Temp:  [97.6 F (36.4 C)-99.4 F (37.4 C)] 97.6 F (36.4 C) (06/19 1420) Pulse Rate:  [54-127] 61 (06/19 1256) Resp:  [15-27] 18 (06/19 1256) BP: (96-154)/(55-121) 113/80 (06/19 1256) SpO2:  [94 %-100 %] 100 % (06/19 1256) Weight:  [47.6 kg] 47.6 kg (06/19 0500) Last BM Date: 02/07/19  Weight change: Filed Weights   02/05/19 0423 02/06/19 0500 02/07/19 0500  Weight: 49.9 kg 47.8 kg 47.6 kg    Intake/Output:   Intake/Output Summary (Last 24 hours) at 02/07/2019 1529 Last data filed at 02/07/2019 1000 Gross per 24 hour  Intake 437.41 ml  Output 770 ml  Net -332.59 ml     Physical Exam: General:  Chronically ill and thin appearing. NAD HEENT: normal x for edentulous Neck: supple. JVP not elevated . Carotids 2+ bilat; no bruits. No lymphadenopathy or thryomegaly appreciated. Cor: PMI nondisplaced. Irregular rate & rhythm. No rubs, gallops or murmurs. Lungs: clear with decreased BS throughout Abdomen: soft, nontender, nondistended. No hepatosplenomegaly. No bruits or masses. Good bowel sounds. Extremities: no cyanosis, clubbing, rash, edema Neuro: sleepy but arousable and orientedx3, cranial nerves grossly intact. moves all 4 extremities w/o difficulty. Affect pleasant   Telemetry:  AF 70s Personally reviewed   Labs: Basic Metabolic Panel: Recent Labs  Lab 02/03/19 0412 02/04/19 0045 02/04/19 0426 02/05/19 0423 02/05/19 0810 02/06/19 0400 02/07/19 0053 02/07/19 0632  NA 141 142  --   --  144 144 141 142  K 3.8 3.8  --   --  3.8 3.1* 3.4* 3.6  CL 112* 111  --   --  108 108 102 100  CO2 24 25  --   --   25 27 26 29   GLUCOSE 100* 117*  --   --  107* 109* 112* 110*  BUN 17 20  --   --  16 14 14 12   CREATININE 0.98 0.82  --   --  0.81 0.88 0.91 0.79  CALCIUM 8.6* 8.5*  --   --  8.9 8.6* 9.4 9.5  MG 1.8  --  1.9 1.8  --  1.8 2.1 2.0  PHOS 2.0*  --  1.6* 3.7  --   --   --   --     Liver Function Tests: Recent Labs  Lab 02/01/19 0850 02/04/19 0426  AST 49* 123*  ALT 23 231*  ALKPHOS 91 59  BILITOT 1.5* 0.6  PROT 6.4* 4.9*  ALBUMIN 3.9 2.4*   Recent Labs  Lab 02/04/19 0426  LIPASE 23  AMYLASE 13*   No results for input(s): AMMONIA in the last 168 hours.  CBC: Recent Labs  Lab 02/01/19 0850  02/03/19 0412 02/04/19 0426 02/05/19 0423 02/06/19 0400 02/07/19 0632  WBC 10.6*   < > 17.3* 11.6* 7.2 7.4 9.3  NEUTROABS 9.0*  --   --  8.0* 5.1  --   --   HGB 12.3   < > 11.7* 11.6* 10.8* 9.8* 12.8  HCT 36.9   < > 36.0 36.2 33.4* 30.2* 38.6  MCV 94.1   < > 98.1 100.0  98.8 98.4 96.3  PLT 277   < > 265 224 152 164 250   < > = values in this interval not displayed.    Cardiac Enzymes: Recent Labs  Lab 02/02/19 0157 02/02/19 0538 02/02/19 1027 02/02/19 1719 02/02/19 2152  TROPONINI 2.83* 3.63* 2.97* 1.65* 1.36*    BNP: BNP (last 3 results) No results for input(s): BNP in the last 8760 hours.  ProBNP (last 3 results) No results for input(s): PROBNP in the last 8760 hours.    Other results:  Imaging: Dg Chest 1 View  Result Date: 02/05/2019 CLINICAL DATA:  Displacement of central venous catheter EXAM: CHEST  1 VIEW COMPARISON:  02/05/2019 FINDINGS: Left central line has been retracted with the tip near the confluence of the innominate veins. Cardiomegaly. Continued diffuse airspace disease throughout the right lung and left lower lung with small bilateral effusions. IMPRESSION: Left central line has been retracted with the tip now at the confluence of the innominate veins. Otherwise no change. Electronically Signed   By: Rolm Baptise M.D.   On: 02/05/2019 22:02      Medications:     Scheduled Medications: . atorvastatin  10 mg Oral Daily  . bisoprolol  2.5 mg Oral Daily  . buPROPion  150 mg Oral Daily  . cefdinir  300 mg Oral Q12H  . Chlorhexidine Gluconate Cloth  6 each Topical Daily  . cloNIDine  0.1 mg Transdermal Weekly  . digoxin  0.125 mg Oral Daily  . feeding supplement (ENSURE ENLIVE)  237 mL Oral TID BM  . fentaNYL  1 patch Transdermal Q72H  . FLUoxetine  20 mg Oral Daily  . furosemide  40 mg Oral Daily  . insulin aspart  0-15 Units Subcutaneous Q4H  . levothyroxine  88 mcg Oral Daily  . losartan  25 mg Oral Daily  . mouth rinse  15 mL Mouth Rinse BID  . QUEtiapine  25 mg Oral QHS  . ramelteon  8 mg Oral QHS  . spironolactone  12.5 mg Oral Daily    Infusions: . sodium chloride    . diltiazem (CARDIZEM) infusion 10 mg/hr (02/07/19 1000)  . heparin 1,100 Units/hr (02/07/19 1126)    PRN Medications: sodium chloride, acetaminophen **OR** acetaminophen, albuterol, fluticasone, haloperidol lactate, methocarbamol, metoprolol tartrate, naLOXone (NARCAN)  injection, ondansetron **OR** ondansetron (ZOFRAN) IV, rOPINIRole   Assessment/plan :   1. Acute on chronic hypoxic respiratory failure in setting of aspiration PNA - has severe underlying COPD on CXR. Per chart quit tobacco in 2001. No PFTs on chart - admitted with severe R-sided PNA likely due to aspiration. - intubated 6/13. Extubated 6/16 - much improved after R thors 6/16 (transudate)  - Keep dry with diuretics.  - Continue lasix 40 daily   2. Chronic AF with RVR - rate back up overnight - now on diltiazem gtt  - will stop diltiazem due to low EF - start bisoprolol 2.5 - can use amio for rate control as needed - Eliquis switched to heparin pending possible cath. No bleeding  3. Acute systolic HF - echo from 0/01 shows EF 20% with Tako-tsubo pattern. Suspect stress CM - Continue low-dose losartan, spiro and digoxin and po lasix - Will need cath this admitwhen MS  clears  4. NSTEMI - Troponin elevated in 2-3 range. Now coming down. No CP ECG with new iRBBB and mild lateral TW changes - suspect demand ischemia in setting of severe medical illness.  - however echo from 6/14 suggestive of Tako-Tsubo EF  20% - continue heparin, ASA, statin - probable cath as she recovers.  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 Hypokalemia/hypomag - will supp  6. Chronic GI issues and malnutrition - now with aspiration PNA  7. Acute ICU delirium - per primary team. Improving. Avoid sedatives   Transfer to 4E. Discussed with Dr. Horald Chestnut of Stay: 5   Glori Bickers  MD 02/07/2019, 3:29 PM  Advanced Heart Failure Team Pager [phone] (M-F; [place])  Please contact [facility] for night-coverage after hours (4p -7a ) and weekends on [email or web address]

## 2019-02-07 NOTE — Progress Notes (Signed)
Called 4E and gave report. Tech to go get the bed. Will transport when bed arrives. Will call family about room transfer.  Modena Morrow E, RN 02/07/2019 11:47 AM

## 2019-02-07 NOTE — Progress Notes (Signed)
Pt eat 30 % of her home made food, but refuses the Ensure. Tolerated well. We'll continue to monitor.

## 2019-02-08 ENCOUNTER — Inpatient Hospital Stay (HOSPITAL_COMMUNITY): Payer: Medicare Other

## 2019-02-08 DIAGNOSIS — I5021 Acute systolic (congestive) heart failure: Secondary | ICD-10-CM

## 2019-02-08 DIAGNOSIS — L8990 Pressure ulcer of unspecified site, unspecified stage: Secondary | ICD-10-CM | POA: Insufficient documentation

## 2019-02-08 LAB — CBC
HCT: 37.4 % (ref 36.0–46.0)
Hemoglobin: 12.3 g/dL (ref 12.0–15.0)
MCH: 31.9 pg (ref 26.0–34.0)
MCHC: 32.9 g/dL (ref 30.0–36.0)
MCV: 97.1 fL (ref 80.0–100.0)
Platelets: 240 10*3/uL (ref 150–400)
RBC: 3.85 MIL/uL — ABNORMAL LOW (ref 3.87–5.11)
RDW: 13.6 % (ref 11.5–15.5)
WBC: 9 10*3/uL (ref 4.0–10.5)
nRBC: 0 % (ref 0.0–0.2)

## 2019-02-08 LAB — GLUCOSE, CAPILLARY
Glucose-Capillary: 99 mg/dL (ref 70–99)
Glucose-Capillary: 107 mg/dL — ABNORMAL HIGH (ref 70–99)
Glucose-Capillary: 86 mg/dL (ref 70–99)
Glucose-Capillary: 132 mg/dL — ABNORMAL HIGH (ref 70–99)
Glucose-Capillary: 116 mg/dL — ABNORMAL HIGH (ref 70–99)

## 2019-02-08 LAB — BASIC METABOLIC PANEL
Anion gap: 11 (ref 5–15)
BUN: 12 mg/dL (ref 8–23)
CO2: 27 mmol/L (ref 22–32)
Calcium: 9.3 mg/dL (ref 8.9–10.3)
Chloride: 102 mmol/L (ref 98–111)
Creatinine, Ser: 0.85 mg/dL (ref 0.44–1.00)
GFR calc Af Amer: >60 mL/min (ref >60)
GFR calc non Af Amer: >60 mL/min (ref >60)
Glucose, Bld: 101 mg/dL — ABNORMAL HIGH (ref 70–99)
Potassium: 3.7 mmol/L (ref 3.5–5.1)
Sodium: 140 mmol/L (ref 135–145)

## 2019-02-08 LAB — MAGNESIUM: Magnesium: 1.9 mg/dL (ref 1.7–2.4)

## 2019-02-08 LAB — HEPARIN LEVEL (UNFRACTIONATED): Heparin Unfractionated: 0.47 IU/mL (ref 0.30–0.70)

## 2019-02-08 MED ORDER — HYDRALAZINE HCL 20 MG/ML IJ SOLN: 10.0000 mg | INTRAMUSCULAR | Status: DC | PRN

## 2019-02-08 MED ORDER — SENNOSIDES-DOCUSATE SODIUM 8.6-50 MG PO TABS: 2.0000 | ORAL_TABLET | Freq: Every evening | ORAL | Status: DC | PRN

## 2019-02-08 MED ORDER — POLYETHYLENE GLYCOL 3350 17 G PO PACK: 17.0000 g | PACK | Freq: Every day | ORAL | Status: DC | PRN

## 2019-02-08 MED ORDER — POTASSIUM CHLORIDE CRYS ER 20 MEQ PO TBCR
40.0000 meq | EXTENDED_RELEASE_TABLET | Freq: Once | ORAL | Status: AC
  Administered 2019-02-08: 40 meq via ORAL
  Filled 2019-02-08: qty 2

## 2019-02-08 NOTE — Plan of Care (Signed)
POC in place, and progressing.

## 2019-02-08 NOTE — Progress Notes (Signed)
PROGRESS NOTE    [name]  QQV:[id number] DOB: [birth year] DOA: 02/01/2019 PCP: Prince Solian, MD   Brief Narrative:  73 year old with history of essential hypertension, hyperlipidemia, paroxysmal A. fib on Eliquis, restless leg syndrome, hypothyroidism, CVA, depression, COPD initially presented with weakness and right knee pain.  Found to have significant electrolyte abnormality and diarrhea developed significant hypoxia.  Diagnosed with staph aureus pneumonia and right-sided transudative pleural effusion requiring thoracentesis 6/16.  Intubated from 6/13-6/16, central line placed 6/13.  Initially on vancomycin and cefepime later switched to Rocephin.  Also diagnosed with systolic CHF with ejection fraction 20%/Takotsubo pattern, heart failure team consulted.   Assessment & Plan:   Principal Problem:   Acute encephalopathy Active Problems:   Acute respiratory failure with hypoxia (HCC)   Hypokalemia   Hypomagnesemia   Leukocytosis   Osteoarthritis   Hypothyroidism   Pneumonia of right lower lobe due to methicillin susceptible Staphylococcus aureus (MSSA) (HCC)   Volume overload state of heart  Acute on chronic hypoxic respiratory failure secondary to aspiration pneumonia Staphylococcus aureus pneumonia - Initially intubated 6/13, extubated 6/16.  Right-sided thoracentesis 6/16 showed transudative fluid -Continue Lasix. -Speech and swallow recommended dysphagia 1 diet but after discussion with the family and the patient, would like to advance it.  She had difficulty with speech therapist as she did not have her dentures at that time. -Continue oral Omnicef  Chronic atrial fibrillation with intermittent RVR - Continue bisoprolol 2.5 mg.  Currently on heparin drip.  Potential plans for left heart catheterization  Acute systolic congestive heart failure, ejection fraction 20% Non-STEMI -Suspect takotsubo pattern.  Currently appears to be euvolemic.  Continue current  medications including losartan, Aldactone, Lasix and digoxin. - Will likely need limited echocardiogram to reassess ejection fraction, defer this to cardiology team.  If still remains depressed patient will likely need left heart catheterization -On aspirin, statin and heparin drip.  History of right-sided nephrectomy -Renal function appears to be stable.  Moderate to severe protein calorie malnutrition - Nutrition consult.  Speech and swallow initially recommended dysphagia 1 diet, family and the patient thinks soft diet would be appropriate as she is doing better.  We will advance her diet.  Acute metabolic encephalopathy versus delirium - Appears to have cleared today.  Avoid any sedative medications.  Will discontinue all sedative medications.  Anxiety/depression -Continue Wellbutrin but discontinue the rest.  Spoke with the patient and family.  Generalized weakness -Continue PT/OT   DVT prophylaxis: Hep Code Status: Full code Family Communication: Dr. Cyndia Bent, her son-in-law, at bedside Disposition Plan: Maintain hospital stay for management of cardiac conditions.  Consultants:   Heart failure  Procedures:   None  Antimicrobials:   Omnicef   Subjective: Patient states she feels much better this morning and is alert and awake.  Dr. Cyndia Bent, her son-in-law, is also at bedside and confirms this is her baseline mentation. Patient request to advance her diet as during her previous speech and swallow therapy she did not have her dentures therefore did not do well but since then she has been tolerating oral diet without any issues.  She also does not want any sedative medications including Prozac.  Prior to hospitalization was plan to take her off Prozac by her primary care provider.  Review of Systems Otherwise negative except as per HPI, including: General: Denies fever, chills, night sweats or unintended weight loss. Resp: Denies cough, wheezing, shortness of breath.  Cardiac: Denies chest pain, palpitations, orthopnea, paroxysmal nocturnal dyspnea. GI: Denies abdominal  pain, nausea, vomiting, diarrhea or constipation GU: Denies dysuria, frequency, hesitancy or incontinence MS: Denies muscle aches, joint pain or swelling Neuro: Denies headache, neurologic deficits (focal weakness, numbness, tingling), abnormal gait Psych: Denies anxiety, depression, SI/HI/AVH Skin: Denies new rashes or lesions ID: Denies sick contacts, exotic exposures, travel  Objective: Vitals:   02/08/19 0003 02/08/19 0408 02/08/19 0452 02/08/19 0744  BP: 108/69 120/82  120/84  Pulse: 68 77 60 79  Resp: 19 16 16 14   Temp: 98.5 F (36.9 C) 98.4 F (36.9 C)  98.1 F (36.7 C)  TempSrc: Oral Oral  Oral  SpO2: 97% 95% 95% 100%  Weight:   53.5 kg   Height:        Intake/Output Summary (Last 24 hours) at 02/08/2019 1572 Last data filed at 02/07/2019 1000 Gross per 24 hour  Intake 38.97 ml  Output -  Net 38.97 ml   Filed Weights   02/06/19 0500 02/07/19 0500 02/08/19 0452  Weight: 47.8 kg 47.6 kg 53.5 kg    Examination:  General exam: Appears calm and comfortable  Respiratory system: Very mild bibasilar crackles Cardiovascular system: S1 & S2 heard, RRR. No JVD, murmurs, rubs, gallops or clicks. No pedal edema. Gastrointestinal system: Abdomen is nondistended, soft and nontender. No organomegaly or masses felt. Normal bowel sounds heard. Central nervous system: Alert and oriented. No focal neurological deficits. Extremities: Symmetric 5 x 5 power. Skin: No rashes, lesions or ulcers Psychiatry: Judgement and insight appear normal. Mood & affect appropriate.     Data Reviewed:   CBC: Recent Labs  Lab 02/01/19 0850  02/04/19 0426 02/05/19 0423 02/06/19 0400 02/07/19 0632 02/08/19 0242  WBC 10.6*   < > 11.6* 7.2 7.4 9.3 9.0  NEUTROABS 9.0*  --  8.0* 5.1  --   --   --   HGB 12.3   < > 11.6* 10.8* 9.8* 12.8 12.3  HCT 36.9   < > 36.2 33.4* 30.2* 38.6 37.4  MCV  94.1   < > 100.0 98.8 98.4 96.3 97.1  PLT 277   < > 224 152 164 250 240   < > = values in this interval not displayed.   Basic Metabolic Panel: Recent Labs  Lab 02/03/19 0412  02/04/19 0426 02/05/19 0423 02/05/19 0810 02/06/19 0400 02/07/19 0053 02/07/19 0632 02/08/19 0242  NA 141   < >  --   --  144 144 141 142 140  K 3.8   < >  --   --  3.8 3.1* 3.4* 3.6 3.7  CL 112*   < >  --   --  108 108 102 100 102  CO2 24   < >  --   --  25 27 26 29 27   GLUCOSE 100*   < >  --   --  107* 109* 112* 110* 101*  BUN 17   < >  --   --  16 14 14 12 12   CREATININE 0.98   < >  --   --  0.81 0.88 0.91 0.79 0.85  CALCIUM 8.6*   < >  --   --  8.9 8.6* 9.4 9.5 9.3  MG 1.8  --  1.9 1.8  --  1.8 2.1 2.0 1.9  PHOS 2.0*  --  1.6* 3.7  --   --   --   --   --    < > = values in this interval not displayed.   GFR: Estimated Creatinine Clearance: 49.8 mL/min (by  C-G formula based on SCr of 0.85 mg/dL). Liver Function Tests: Recent Labs  Lab 02/01/19 0850 02/04/19 0426  AST 49* 123*  ALT 23 231*  ALKPHOS 91 59  BILITOT 1.5* 0.6  PROT 6.4* 4.9*  ALBUMIN 3.9 2.4*   Recent Labs  Lab 02/04/19 0426  LIPASE 23  AMYLASE 13*   No results for input(s): AMMONIA in the last 168 hours. Coagulation Profile: Recent Labs  Lab 02/04/19 0426  INR 1.3*   Cardiac Enzymes: Recent Labs  Lab 02/02/19 0157 02/02/19 0538 02/02/19 1027 02/02/19 1719 02/02/19 2152  TROPONINI 2.83* 3.63* 2.97* 1.65* 1.36*   BNP (last 3 results) No results for input(s): PROBNP in the last 8760 hours. HbA1C: No results for input(s): HGBA1C in the last 72 hours. CBG: Recent Labs  Lab 02/07/19 1112 02/07/19 1845 02/07/19 2046 02/08/19 0413 02/08/19 0741  GLUCAP 116* 92 93 99 107*   Lipid Profile: No results for input(s): CHOL, HDL, LDLCALC, TRIG, CHOLHDL, LDLDIRECT in the last 72 hours. Thyroid Function Tests: No results for input(s): TSH, T4TOTAL, FREET4, T3FREE, THYROIDAB in the last 72 hours. Anemia Panel: No  results for input(s): VITAMINB12, FOLATE, FERRITIN, TIBC, IRON, RETICCTPCT in the last 72 hours. Sepsis Labs: Recent Labs  Lab 02/04/19 0045  LATICACIDVEN 1.2    Recent Results (from the past 240 hour(s))  Novel Coronavirus, NAA (hospital order; send-out to ref lab)     Status: None   Collection Time: 02/01/19  3:25 PM   Specimen: Nasopharyngeal Swab; Respiratory  Result Value Ref Range Status   SARS-CoV-2, NAA NOT DETECTED NOT DETECTED Final    Comment: (NOTE) This test was developed and its performance characteristics determined by [employer]. This test has not been FDA cleared or approved. This test has been authorized by FDA under an Emergency Use Authorization (EUA). This test is only authorized for the duration of time the declaration that circumstances exist justifying the authorization of the emergency use of in vitro diagnostic tests for detection of SARS-CoV-2 virus and/or diagnosis of COVID-19 infection under section 564(b)(1) of the Act, 21 U.S.C. 443XVQ-0(G)(8), unless the authorization is terminated or revoked sooner. When diagnostic testing is negative, the possibility of a false negative result should be considered in the context of a patient's recent exposures and the presence of clinical signs and symptoms consistent with COVID-19. An individual without symptoms of COVID-19 and who is not shedding SARS-CoV-2 virus would expect to have a negative (not detected) result in this assay. Performed  At: [facility] [address] [place], [place] [id number] Rush Farmer MD OI:[id number]    [place]  Final    Comment: Performed at [facility], [place] [address], [place], [place] [address]  SARS Coronavirus 2     Status: None   Collection Time: 02/01/19  5:05 PM  Result Value Ref Range Status   SARS Coronavirus 2 NOT DETECTED NOT DETECTED Final    Comment: (NOTE) SARS-CoV-2 target nucleic acids are NOT DETECTED. The  SARS-CoV-2 RNA is generally detectable in upper and lower respiratory specimens during the acute phase of infection.  Negative  results do not preclude SARS-CoV-2 infection, do not rule out co-infections with other pathogens, and should not be used as the sole basis for treatment or other patient management decisions.  Negative results must be combined with clinical observations, patient history, and epidemiological information. The expected result is Not Detected. Fact Sheet for Patients: http://www.biofiredefense.com/wp-content/uploads/2020/03/BIOFIRE-COVID -19-patients.pdf Fact Sheet for Healthcare Providers: http://www.biofiredefense.com/wp-content/uploads/2020/03/BIOFIRE-COVID -19-hcp.pdf This test  is not yet approved or cleared by the [place] and  has been authorized for detection and/or diagnosis of SARS-CoV-2 by FDA under an Emergency Use Authorization (EUA).  This EUA will remain in effec t (meaning this test can be used) for the duration of  the COVID-19 declaration under Section 564(b)(1) of the Act, 21 U.S.C. section 360bbb-3(b)(1), unless the authorization is terminated or revoked sooner. Performed at [facility], [place] [address] [place], [place] [address]   MRSA PCR Screening     Status: None   Collection Time: 02/01/19  8:46 PM   Specimen: Nasopharyngeal  Result Value Ref Range Status   MRSA by PCR NEGATIVE NEGATIVE Final    Comment:        The GeneXpert MRSA Assay (FDA approved for NASAL specimens only), is one component of a comprehensive MRSA colonization surveillance program. It is not intended to diagnose MRSA infection nor to guide or monitor treatment for MRSA infections. Performed at [facility], [place] [address] [place], [place] [address]   Culture, blood (routine x 2)     Status: None   Collection Time: 02/01/19  9:28 PM   Specimen: BLOOD  Result Value Ref Range Status   Specimen Description BLOOD RIGHT ANTECUBITAL  Final    Special Requests   Final    BOTTLES DRAWN AEROBIC ONLY Blood Culture adequate volume   Culture   Final    NO GROWTH 5 DAYS Performed at [facility], [place] [address], [place], [place] [address]    Report Status 02/06/2019 FINAL  Final  Culture, blood (routine x 2)     Status: None   Collection Time: 02/01/19  9:28 PM   Specimen: BLOOD  Result Value Ref Range Status   Specimen Description BLOOD RIGHT ANTECUBITAL  Final   Special Requests   Final    BOTTLES DRAWN AEROBIC ONLY Blood Culture adequate volume   Culture   Final    NO GROWTH 5 DAYS Performed at [facility], [place] [address], [place], [place] [address]    Report Status 02/06/2019 FINAL  Final  Culture, respiratory (non-expectorated)     Status: None   Collection Time: 02/02/19  4:40 PM   Specimen: Tracheal Aspirate; Respiratory  Result Value Ref Range Status   Specimen Description TRACHEAL ASPIRATE  Final   Special Requests Normal  Final   Gram Stain   Final    RARE WBC PRESENT,BOTH PMN AND MONONUCLEAR RARE GRAM POSITIVE COCCI IN PAIRS Performed at [facility], [place] [address] [place], [place] [address]    Culture MODERATE STAPHYLOCOCCUS AUREUS  Final   Report Status 02/05/2019 FINAL  Final   Organism ID, Bacteria STAPHYLOCOCCUS AUREUS  Final      Susceptibility   Staphylococcus aureus - MIC*    CIPROFLOXACIN <=0.5 SENSITIVE Sensitive     ERYTHROMYCIN <=0.25 SENSITIVE Sensitive     GENTAMICIN <=0.5 SENSITIVE Sensitive     OXACILLIN <=0.25 SENSITIVE Sensitive     TETRACYCLINE <=1 SENSITIVE Sensitive     VANCOMYCIN 1 SENSITIVE Sensitive     TRIMETH/SULFA <=10 SENSITIVE Sensitive     CLINDAMYCIN <=0.25 SENSITIVE Sensitive     RIFAMPIN <=0.5 SENSITIVE Sensitive     Inducible Clindamycin NEGATIVE Sensitive     * MODERATE STAPHYLOCOCCUS AUREUS  Respiratory Panel by PCR     Status: None   Collection Time: 02/03/19  9:33 AM   Specimen: Nasopharyngeal Swab; Respiratory  Result Value  Ref Range Status    Adenovirus NOT DETECTED NOT DETECTED Final   Coronavirus 229E NOT DETECTED NOT DETECTED Final    Comment: (NOTE) The Coronavirus on the Respiratory Panel, DOES NOT test for the novel  Coronavirus (2019 nCoV)    Coronavirus HKU1 NOT DETECTED NOT DETECTED Final   Coronavirus NL63 NOT DETECTED NOT DETECTED Final   Coronavirus OC43 NOT DETECTED NOT DETECTED Final   Metapneumovirus NOT DETECTED NOT DETECTED Final   Rhinovirus / Enterovirus NOT DETECTED NOT DETECTED Final   Influenza A NOT DETECTED NOT DETECTED Final   Influenza B NOT DETECTED NOT DETECTED Final   Parainfluenza Virus 1 NOT DETECTED NOT DETECTED Final   Parainfluenza Virus 2 NOT DETECTED NOT DETECTED Final   Parainfluenza Virus 3 NOT DETECTED NOT DETECTED Final   Parainfluenza Virus 4 NOT DETECTED NOT DETECTED Final   Respiratory Syncytial Virus NOT DETECTED NOT DETECTED Final   Bordetella pertussis NOT DETECTED NOT DETECTED Final   Chlamydophila pneumoniae NOT DETECTED NOT DETECTED Final   Mycoplasma pneumoniae NOT DETECTED NOT DETECTED Final    Comment: Performed at [facility], [place] [address], [place], [place] [address]  Body fluid culture (includes gram stain)     Status: None   Collection Time: 02/04/19  1:10 PM   Specimen: Pleural Fluid  Result Value Ref Range Status   Specimen Description PLEURAL  Final   Special Requests FLUID  Final   Gram Stain   Final    ABUNDANT WBC PRESENT,BOTH PMN AND MONONUCLEAR NO ORGANISMS SEEN    Culture   Final    NO GROWTH Performed at [facility], [address] [place], [place] [address]    Report Status 02/07/2019 FINAL  Final         Radiology Studies: No results found.      Scheduled Meds: . atorvastatin  10 mg Oral Daily  . bisoprolol  2.5 mg Oral Daily  . buPROPion  150 mg Oral Daily  . cefdinir  300 mg Oral Q12H  . Chlorhexidine Gluconate Cloth  6 each Topical Daily  . cloNIDine  0.1 mg Transdermal Weekly  . digoxin  0.125 mg Oral Daily   . feeding supplement (ENSURE ENLIVE)  237 mL Oral TID BM  . fentaNYL  1 patch Transdermal Q72H  . FLUoxetine  20 mg Oral Daily  . furosemide  40 mg Oral Daily  . insulin aspart  0-15 Units Subcutaneous Q4H  . levothyroxine  88 mcg Oral Daily  . losartan  25 mg Oral Daily  . mouth rinse  15 mL Mouth Rinse BID  . potassium chloride  40 mEq Oral Once  . QUEtiapine  25 mg Oral QHS  . ramelteon  8 mg Oral QHS  . spironolactone  12.5 mg Oral Daily   Continuous Infusions: . sodium chloride    . diltiazem (CARDIZEM) infusion 10 mg/hr (02/07/19 1000)  . heparin 1,200 Units/hr (02/07/19 1600)     LOS: 6 days   Time spent= 30 mins    Abigale Dorow Arsenio Loader, MD [facility]  If 7PM-7AM, please contact night-coverage [email or web address] 02/08/2019, 8:22 AM

## 2019-02-08 NOTE — Progress Notes (Signed)
[place] for Heparin Indication: atrial fibrillation  Allergies  Allergen Reactions  . Penicillins     Causes rash Has patient had a PCN reaction causing immediate rash, facial/tongue/throat swelling, SOB or lightheadedness with hypotension: YES Has patient had a PCN reaction causing severe rash involving mucus membranes or skin necrosis: No Has patient had a PCN reaction that required hospitalization No Has patient had a PCN reaction occurring within the last 10 years: No If all of the above answers are "NO", then may proceed with Cephalosporin use.     Patient Measurements: Height: 5\' 5"  (165.1 cm) Weight: 118 lb (53.5 kg) IBW/kg (Calculated) : 57 Heparin Dosing Weight: 53.2 kg  Vital Signs: Temp: 98.1 F (36.7 C) (06/20 0744) Temp Source: Oral (06/20 0744) BP: 120/84 (06/20 0744) Pulse Rate: 79 (06/20 0744)  Labs: Recent Labs    02/05/19 1109  02/06/19 0400  02/07/19 0053 02/07/19 0632 02/07/19 1446 02/07/19 2233 02/08/19 0242  HGB  --    < > 9.8*  --   --  12.8  --   --  12.3  HCT  --   --  30.2*  --   --  38.6  --   --  37.4  PLT  --   --  164  --   --  250  --   --  240  APTT 79*  --   --   --   --   --   --   --   --   HEPARINUNFRC 0.46  --   --    < >  --  0.34 0.23* 0.36 0.47  CREATININE  --   --  0.88  --  0.91 0.79  --   --  0.85   < > = values in this interval not displayed.    Estimated Creatinine Clearance: 49.8 mL/min (by C-G formula based on SCr of 0.85 mg/dL).   Assessment: 73 y.o. female with h/o Afib, PTA apixaban on hold, for heparin. Pt seems to be sensitive to rate changes.  Heparin level came back slightly subtherapeutic at 0.23, on 1100 units/hr. Hgb 12.3, plt remains WNL. No overt bleeding noted.  Goal of Therapy:  Heparin level 0.3-0.7 Monitor platelets by anticoagulation protocol: Yes   Plan:  Continue heparin IV 1200 units/hr. Continue to watch Hgb and pltc. Monitor signs/symptoms of  bleeding. F/u ability to resume oral anticoagulation.  Marguerite Olea, [facility] Clinical Pharmacist Phone [phone]  02/08/2019 7:58 AM

## 2019-02-08 NOTE — Progress Notes (Signed)
Advanced Heart Failure Rounding Note   Subjective:    Had R thoracentesis on 6/16.   Looks great this am. Sitting up in bed on phone. Breathing ok. No orthopnea or PND.   AF rate in 80s.  Objective:   Weight Range:  Vital Signs:   Temp:  [97.6 F (36.4 C)-98.8 F (37.1 C)] 98.8 F (37.1 C) (06/20 1106) Pulse Rate:  [51-107] 107 (06/20 1106) Resp:  [14-27] 19 (06/20 1106) BP: (108-147)/(51-84) 147/51 (06/20 1106) SpO2:  [95 %-100 %] 98 % (06/20 0900) Weight:  [53.5 kg] 53.5 kg (06/20 0452) Last BM Date: 02/07/19  Weight change: Filed Weights   02/06/19 0500 02/07/19 0500 02/08/19 0452  Weight: 47.8 kg 47.6 kg 53.5 kg    Intake/Output:   Intake/Output Summary (Last 24 hours) at 02/08/2019 1146 Last data filed at 02/08/2019 1106 Gross per 24 hour  Intake 388.34 ml  Output 250 ml  Net 138.34 ml     Physical Exam: General:  Sitting up in bed . No resp difficulty HEENT: normal Neck: supple. no JVD. Carotids 2+ bilat; no bruits. No lymphadenopathy or thryomegaly appreciated. Cor: PMI nondisplaced. Irregular rate & rhythm. No rubs, gallops or murmurs. Lungs: clear Abdomen: soft, nontender, nondistended. No hepatosplenomegaly. No bruits or masses. Good bowel sounds. Extremities: no cyanosis, clubbing, rash, edema diffuse arthritic changes Neuro: alert & orientedx3, cranial nerves grossly intact. moves all 4 extremities w/o difficulty. Affect pleasant   Telemetry:  AF 90s Personally reviewed   Labs: Basic Metabolic Panel: Recent Labs  Lab 02/03/19 0412  02/04/19 0426 02/05/19 0423 02/05/19 0810 02/06/19 0400 02/07/19 0053 02/07/19 0632 02/08/19 0242  NA 141   < >  --   --  144 144 141 142 140  K 3.8   < >  --   --  3.8 3.1* 3.4* 3.6 3.7  CL 112*   < >  --   --  108 108 102 100 102  CO2 24   < >  --   --  25 27 26 29 27   GLUCOSE 100*   < >  --   --  107* 109* 112* 110* 101*  BUN 17   < >  --   --  16 14 14 12 12   CREATININE 0.98   < >  --   --  0.81  0.88 0.91 0.79 0.85  CALCIUM 8.6*   < >  --   --  8.9 8.6* 9.4 9.5 9.3  MG 1.8  --  1.9 1.8  --  1.8 2.1 2.0 1.9  PHOS 2.0*  --  1.6* 3.7  --   --   --   --   --    < > = values in this interval not displayed.    Liver Function Tests: Recent Labs  Lab 02/04/19 0426  AST 123*  ALT 231*  ALKPHOS 59  BILITOT 0.6  PROT 4.9*  ALBUMIN 2.4*   Recent Labs  Lab 02/04/19 0426  LIPASE 23  AMYLASE 13*   No results for input(s): AMMONIA in the last 168 hours.  CBC: Recent Labs  Lab 02/04/19 0426 02/05/19 0423 02/06/19 0400 02/07/19 0632 02/08/19 0242  WBC 11.6* 7.2 7.4 9.3 9.0  NEUTROABS 8.0* 5.1  --   --   --   HGB 11.6* 10.8* 9.8* 12.8 12.3  HCT 36.2 33.4* 30.2* 38.6 37.4  MCV 100.0 98.8 98.4 96.3 97.1  PLT 224 152 164 250 240  Cardiac Enzymes: Recent Labs  Lab 02/02/19 0157 02/02/19 0538 02/02/19 1027 02/02/19 1719 02/02/19 2152  TROPONINI 2.83* 3.63* 2.97* 1.65* 1.36*    BNP: BNP (last 3 results) No results for input(s): BNP in the last 8760 hours.  ProBNP (last 3 results) No results for input(s): PROBNP in the last 8760 hours.    Other results:  Imaging: No results found.   Medications:     Scheduled Medications: . atorvastatin  10 mg Oral Daily  . bisoprolol  2.5 mg Oral Daily  . buPROPion  150 mg Oral Daily  . cefdinir  300 mg Oral Q12H  . Chlorhexidine Gluconate Cloth  6 each Topical Daily  . cloNIDine  0.1 mg Transdermal Weekly  . digoxin  0.125 mg Oral Daily  . feeding supplement (ENSURE ENLIVE)  237 mL Oral TID BM  . fentaNYL  1 patch Transdermal Q72H  . FLUoxetine  20 mg Oral Daily  . furosemide  40 mg Oral Daily  . insulin aspart  0-15 Units Subcutaneous Q4H  . levothyroxine  88 mcg Oral Daily  . losartan  25 mg Oral Daily  . mouth rinse  15 mL Mouth Rinse BID  . QUEtiapine  25 mg Oral QHS  . ramelteon  8 mg Oral QHS  . spironolactone  12.5 mg Oral Daily    Infusions: . sodium chloride    . diltiazem (CARDIZEM) infusion  10 mg/hr (02/07/19 1000)  . heparin 1,200 Units/hr (02/08/19 0900)    PRN Medications: sodium chloride, acetaminophen **OR** acetaminophen, albuterol, fluticasone, haloperidol lactate, hydrALAZINE, methocarbamol, metoprolol tartrate, naLOXone (NARCAN)  injection, ondansetron **OR** ondansetron (ZOFRAN) IV, polyethylene glycol, rOPINIRole, senna-docusate   Assessment/plan :   1. Acute on chronic hypoxic respiratory failure in setting of aspiration PNA - has severe underlying COPD on CXR. Per chart quit tobacco in 2001. No PFTs on chart - admitted with severe R-sided PNA likely due to aspiration. - intubated 6/13. Extubated 6/16 - much improved after R thors 6/16 (transudate)  - Keep dry with diuretics.  - Continue lasix 40 daily   2. Chronic AF with RVR - rate much improved on bisoprolol 2.5. will continue - can use amio for rate control as needed - Eliquis switched to heparin pending possible cath. No bleeding. Discussed dosing with PharmD personally.  3. Acute systolic HF - echo from 2/53 shows EF 20% with Tako-tsubo pattern. Suspect stress CM - Continue low-dose losartan, spiro and digoxin and po lasix - Will need cath this admitwhen MS clears - limited echo tomorrow to reassess EF  4. NSTEMI - Troponin elevated in 2-3 range. Now coming down. No CP ECG with new iRBBB and mild lateral TW changes - suspect demand ischemia in setting of severe medical illness.  - however echo from 6/14 suggestive of Tako-Tsubo EF 20% - continue heparin, ASA, statin - probable cath as she recovers.  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 Hypokalemia/hypomag - will supp  6. Chronic GI issues and malnutrition - now with aspiration PNA  7. Acute ICU delirium - Resolved    Length of Stay: 6   Glori Bickers  MD 02/08/2019, 11:46 AM  Advanced Heart Failure Team Pager [phone] (M-F; [place])  Please contact [facility] for  night-coverage after hours (4p -7a ) and weekends on [email or web address]

## 2019-02-09 LAB — COMPREHENSIVE METABOLIC PANEL
ALT: 81 U/L — ABNORMAL HIGH (ref 0–44)
AST: 22 U/L (ref 15–41)
Albumin: 3 g/dL — ABNORMAL LOW (ref 3.5–5.0)
Alkaline Phosphatase: 50 U/L (ref 38–126)
Anion gap: 11 (ref 5–15)
BUN: 12 mg/dL (ref 8–23)
CO2: 27 mmol/L (ref 22–32)
Calcium: 9.1 mg/dL (ref 8.9–10.3)
Chloride: 101 mmol/L (ref 98–111)
Creatinine, Ser: 0.83 mg/dL (ref 0.44–1.00)
GFR calc Af Amer: >60 mL/min (ref >60)
GFR calc non Af Amer: >60 mL/min (ref >60)
Glucose, Bld: 98 mg/dL (ref 70–99)
Potassium: 3.5 mmol/L (ref 3.5–5.1)
Sodium: 139 mmol/L (ref 135–145)
Total Bilirubin: 1.1 mg/dL (ref 0.3–1.2)
Total Protein: 5.4 g/dL — ABNORMAL LOW (ref 6.5–8.1)

## 2019-02-09 LAB — BRAIN NATRIURETIC PEPTIDE: B Natriuretic Peptide: 1708.7 pg/mL — ABNORMAL HIGH (ref 0.0–100.0)

## 2019-02-09 LAB — CBC
HCT: 39.4 % (ref 36.0–46.0)
Hemoglobin: 13.2 g/dL (ref 12.0–15.0)
MCH: 32.1 pg (ref 26.0–34.0)
MCHC: 33.5 g/dL (ref 30.0–36.0)
MCV: 95.9 fL (ref 80.0–100.0)
Platelets: 265 10*3/uL (ref 150–400)
RBC: 4.11 MIL/uL (ref 3.87–5.11)
RDW: 13.5 % (ref 11.5–15.5)
WBC: 9.8 10*3/uL (ref 4.0–10.5)
nRBC: 0 % (ref 0.0–0.2)

## 2019-02-09 LAB — GLUCOSE, CAPILLARY
Glucose-Capillary: 100 mg/dL — ABNORMAL HIGH (ref 70–99)
Glucose-Capillary: 101 mg/dL — ABNORMAL HIGH (ref 70–99)
Glucose-Capillary: 116 mg/dL — ABNORMAL HIGH (ref 70–99)
Glucose-Capillary: 78 mg/dL (ref 70–99)
Glucose-Capillary: 85 mg/dL (ref 70–99)
Glucose-Capillary: 96 mg/dL (ref 70–99)

## 2019-02-09 LAB — HEPARIN LEVEL (UNFRACTIONATED): Heparin Unfractionated: 0.43 IU/mL (ref 0.30–0.70)

## 2019-02-09 LAB — PROCALCITONIN: Procalcitonin: <0.1 ng/mL

## 2019-02-09 LAB — MAGNESIUM: Magnesium: 1.6 mg/dL — ABNORMAL LOW (ref 1.7–2.4)

## 2019-02-09 MED ORDER — SODIUM CHLORIDE 0.9% FLUSH: 3.0000 mL | Freq: Two times a day (BID) | INTRAVENOUS | Status: DC

## 2019-02-09 MED ORDER — ACETAMINOPHEN 650 MG RE SUPP: 650.0000 mg | Freq: Four times a day (QID) | RECTAL | Status: DC | PRN

## 2019-02-09 MED ORDER — SODIUM CHLORIDE 0.9 % IV SOLN
INTRAVENOUS | Status: DC
  Administered 2019-02-10: 02:00:00 via INTRAVENOUS

## 2019-02-09 MED ORDER — ACETAMINOPHEN 325 MG PO TABS
650.0000 mg | ORAL_TABLET | Freq: Four times a day (QID) | ORAL | Status: DC | PRN
  Administered 2019-02-09 – 2019-02-11 (×3): 650 mg via ORAL
  Filled 2019-02-09 (×2): qty 2

## 2019-02-09 MED ORDER — MAGNESIUM OXIDE 400 (241.3 MG) MG PO TABS
800.0000 mg | ORAL_TABLET | ORAL | Status: DC
  Administered 2019-02-09: 800 mg via ORAL
  Filled 2019-02-09: qty 2

## 2019-02-09 MED ORDER — SODIUM CHLORIDE 0.9% FLUSH: 3.0000 mL | INTRAVENOUS | Status: DC | PRN

## 2019-02-09 MED ORDER — MAGNESIUM SULFATE 4 GM/100ML IV SOLN
4.0000 g | Freq: Once | INTRAVENOUS | Status: AC
  Administered 2019-02-09: 4 g via INTRAVENOUS
  Filled 2019-02-09: qty 100

## 2019-02-09 MED ORDER — POTASSIUM CHLORIDE CRYS ER 20 MEQ PO TBCR
30.0000 meq | EXTENDED_RELEASE_TABLET | Freq: Four times a day (QID) | ORAL | Status: AC
  Administered 2019-02-09 (×2): 30 meq via ORAL
  Filled 2019-02-09 (×2): qty 1

## 2019-02-09 MED ORDER — POTASSIUM CHLORIDE CRYS ER 20 MEQ PO TBCR
40.0000 meq | EXTENDED_RELEASE_TABLET | Freq: Once | ORAL | Status: AC
  Administered 2019-02-09: 40 meq via ORAL
  Filled 2019-02-09: qty 2

## 2019-02-09 MED ORDER — ASPIRIN 81 MG PO CHEW
81.0000 mg | CHEWABLE_TABLET | ORAL | Status: AC
  Administered 2019-02-10: 81 mg via ORAL
  Filled 2019-02-09: qty 1

## 2019-02-09 MED ORDER — SODIUM CHLORIDE 0.9 % IV SOLN: 250.0000 mL | INTRAVENOUS | Status: DC | PRN

## 2019-02-09 NOTE — Progress Notes (Signed)
Advanced Heart Failure Rounding Note   Subjective:    Had R thoracentesis on 6/16.   Sitting up in bed. No complaints. No CP or SOB. On heparin for AF. No bleeding.  AF rates in 80s   Objective:   Weight Range:  Vital Signs:   Temp:  [97.5 F (36.4 C)-98.8 F (37.1 C)] 97.8 F (36.6 C) (06/21 0427) Pulse Rate:  [44-107] 88 (06/21 0902) Resp:  [13-19] 17 (06/21 0427) BP: (102-147)/(51-91) 102/71 (06/21 0427) SpO2:  [94 %-100 %] 94 % (06/21 0427) Weight:  [51.4 kg] 51.4 kg (06/21 0304) Last BM Date: 02/07/19  Weight change: Filed Weights   02/07/19 0500 02/08/19 0452 02/09/19 0304  Weight: 47.6 kg 53.5 kg 51.4 kg    Intake/Output:   Intake/Output Summary (Last 24 hours) at 02/09/2019 0910 Last data filed at 02/09/2019 0640 Gross per 24 hour  Intake 379.88 ml  Output 250 ml  Net 129.88 ml     Physical Exam: General: Sitting up in bed No resp difficulty HEENT: normal Neck: supple. no JVD. Carotids 2+ bilat; no bruits. No lymphadenopathy or thryomegaly appreciated. Cor: PMI nondisplaced. Iregular rate & rhythm. No rubs, gallops or murmurs. Lungs: clear with decreased BS Abdomen: soft, nontender, nondistended. No hepatosplenomegaly. No bruits or masses. Good bowel sounds. Extremities: no cyanosis, clubbing, rash, edema diffuse arthritic changes Neuro: alert & orientedx3, cranial nerves grossly intact. moves all 4 extremities w/o difficulty. Affect pleasant   Telemetry:  AF 80s Personally reviewed   Labs: Basic Metabolic Panel: Recent Labs  Lab 02/03/19 0412  02/04/19 0426 02/05/19 0423  02/06/19 0400 02/07/19 0053 02/07/19 3474 02/08/19 0242 02/09/19 0444  NA 141   < >  --   --    < > 144 141 142 140 139  K 3.8   < >  --   --    < > 3.1* 3.4* 3.6 3.7 3.5  CL 112*   < >  --   --    < > 108 102 100 102 101  CO2 24   < >  --   --    < > 27 26 29 27 27   GLUCOSE 100*   < >  --   --    < > 109* 112* 110* 101* 98  BUN 17   < >  --   --    < > 14 14 12  12 12   CREATININE 0.98   < >  --   --    < > 0.88 0.91 0.79 0.85 0.83  CALCIUM 8.6*   < >  --   --    < > 8.6* 9.4 9.5 9.3 9.1  MG 1.8  --  1.9 1.8  --  1.8 2.1 2.0 1.9 1.6*  PHOS 2.0*  --  1.6* 3.7  --   --   --   --   --   --    < > = values in this interval not displayed.    Liver Function Tests: Recent Labs  Lab 02/04/19 0426 02/09/19 0444  AST 123* 22  ALT 231* 81*  ALKPHOS 59 50  BILITOT 0.6 1.1  PROT 4.9* 5.4*  ALBUMIN 2.4* 3.0*   Recent Labs  Lab 02/04/19 0426  LIPASE 23  AMYLASE 13*   No results for input(s): AMMONIA in the last 168 hours.  CBC: Recent Labs  Lab 02/04/19 0426 02/05/19 0423 02/06/19 0400 02/07/19 0632 02/08/19 0242 02/09/19 0444  WBC 11.6*  7.2 7.4 9.3 9.0 9.8  NEUTROABS 8.0* 5.1  --   --   --   --   HGB 11.6* 10.8* 9.8* 12.8 12.3 13.2  HCT 36.2 33.4* 30.2* 38.6 37.4 39.4  MCV 100.0 98.8 98.4 96.3 97.1 95.9  PLT 224 152 164 250 240 265    Cardiac Enzymes: Recent Labs  Lab 02/02/19 1027 02/02/19 1719 02/02/19 2152  TROPONINI 2.97* 1.65* 1.36*    BNP: BNP (last 3 results) Recent Labs    02/09/19 0444  BNP 1,708.7*    ProBNP (last 3 results) No results for input(s): PROBNP in the last 8760 hours.    Other results:  Imaging: Dg Chest Port 1 View  Result Date: 02/08/2019 CLINICAL DATA:  73 year old female with respiratory failure. EXAM: PORTABLE CHEST 1 VIEW COMPARISON:  02/05/2019 FINDINGS: Cardiomegaly, pulmonary vascular congestion and interstitial opacities/edema again noted. Bilateral LOWER lung atelectasis/consolidation and probable effusions noted. There is no evidence of pneumothorax. LEFT IJ central venous catheter has been removed. IMPRESSION: LEFT central venous catheter removal, otherwise unchanged appearance of the chest with cardiomegaly, interstitial opacities/edema, probable pleural effusions and bibasilar atelectasis/consolidation. Electronically Signed   By: Margarette Canada M.D.   On: 02/08/2019 14:36      Medications:     Scheduled Medications: . atorvastatin  10 mg Oral Daily  . bisoprolol  2.5 mg Oral Daily  . buPROPion  150 mg Oral Daily  . cefdinir  300 mg Oral Q12H  . Chlorhexidine Gluconate Cloth  6 each Topical Daily  . cloNIDine  0.1 mg Transdermal Weekly  . digoxin  0.125 mg Oral Daily  . feeding supplement (ENSURE ENLIVE)  237 mL Oral TID BM  . fentaNYL  1 patch Transdermal Q72H  . furosemide  40 mg Oral Daily  . insulin aspart  0-15 Units Subcutaneous Q4H  . levothyroxine  88 mcg Oral Daily  . losartan  25 mg Oral Daily  . magnesium oxide  800 mg Oral Q4H  . mouth rinse  15 mL Mouth Rinse BID  . potassium chloride  30 mEq Oral Q6H  . spironolactone  12.5 mg Oral Daily    Infusions: . sodium chloride    . heparin 1,200 Units/hr (02/09/19 9379)  . magnesium sulfate bolus IVPB      PRN Medications: sodium chloride, acetaminophen **OR** acetaminophen, albuterol, fluticasone, hydrALAZINE, methocarbamol, metoprolol tartrate, naLOXone (NARCAN)  injection, ondansetron **OR** ondansetron (ZOFRAN) IV, polyethylene glycol, rOPINIRole, senna-docusate   Assessment/plan :   1. Acute on chronic hypoxic respiratory failure in setting of aspiration PNA - has severe underlying COPD on CXR. Per chart quit tobacco in 2001. No PFTs on chart - admitted with severe R-sided PNA likely due to aspiration. - intubated 6/13. Extubated 6/16 - much improved after R thors 6/16 (transudate)  - Keep dry with diuretics.  - Continue lasix 40 daily  2. Chronic AF with RVR - rate much improved on bisoprolol 2.5. will continue - Eliquis switched to heparin pending possible cath. No bleeding. Discussed dosing with PharmD personally.  3. Acute systolic HF - echo from 0/24 shows EF 20% with Tako-tsubo pattern. Suspect stress CM - Continue low-dose losartan, spiro and digoxin and po lasix - Cath tomorrow am  - limited echo today to reassess EF  4. NSTEMI - Troponin elevated in 2-3 range. Now  coming down. No CP ECG with new iRBBB and mild lateral TW changes - suspect demand ischemia in setting of severe medical illness.  - however echo from 6/14 suggestive  of Tako-Tsubo EF 20% - continue heparin, ASA, statin - cath in am   5. Hypokalemia/hypomag - will supp mag today  6. Chronic GI issues and malnutrition - now with aspiration PNA  7. Acute ICU delirium - Resolved    Length of Stay: 7   Glori Bickers  MD 02/09/2019, 9:10 AM  Advanced Heart Failure Team Pager [phone] (M-F; [place])  Please contact [facility] for night-coverage after hours (4p -7a ) and weekends on [email or web address]

## 2019-02-09 NOTE — Progress Notes (Signed)
PROGRESS NOTE    [name]  SWF:[id number] DOB: Feb 21, 1946 DOA: 02/01/2019 PCP: Prince Solian, MD   Brief Narrative:  73 year old with history of essential hypertension, hyperlipidemia, paroxysmal A. fib on Eliquis, restless leg syndrome, hypothyroidism, CVA, depression, COPD initially presented with weakness and right knee pain.  Found to have significant electrolyte abnormality and diarrhea developed significant hypoxia.  Diagnosed with staph aureus pneumonia and right-sided transudative pleural effusion requiring thoracentesis 6/16.  Intubated from 6/13-6/16, central line placed 6/13.  Initially on vancomycin and cefepime later switched to Rocephin.  Also diagnosed with systolic CHF with ejection fraction 20%/Takotsubo pattern, heart failure team consulted. Plans to obtain limited echo and then Cath tomorrow.    Assessment & Plan:   Principal Problem:   Acute encephalopathy Active Problems:   Acute respiratory failure with hypoxia (HCC)   Hypokalemia   Hypomagnesemia   Leukocytosis   Osteoarthritis   Hypothyroidism   Pneumonia of right lower lobe due to methicillin susceptible Staphylococcus aureus (MSSA) (HCC)   Volume overload state of heart   Pressure injury of skin  Acute on chronic hypoxic respiratory failure secondary to aspiration pneumonia Staphylococcus aureus pneumonia - Initially intubated 6/13, extubated 6/16.  Right-sided thoracentesis 6/16 showed transudative fluid -Continue Lasix. -Speech and swallow recommended dysphagia 1 diet but after discussion with the family and the patient, would like to advance it.  She had difficulty with speech therapist as she did not have her dentures at that time. -Continue oral Omnicef, pro cal negative. Should be able to complete a short course and discontinue it in next 2-3 days.   Chronic atrial fibrillation with intermittent RVR - Continue bisoprolol 2.5 mg.  Cont Heparin Drip.  Plans for Cath tomorrow.   Acute systolic  congestive heart failure, ejection fraction 20% Non-STEMI -Suspect takotsubo pattern.  Currently appears to be euvolemic.  Continue current medications including losartan, Aldactone, Lasix and digoxin. - Will likely need limited echocardiogram to reassess ejection fraction, defer this to cardiology team.  If still remains depressed patient will likely need left heart catheterization -On aspirin, statin and heparin drip.  History of right-sided nephrectomy -Renal function appears to be stable.  Moderate to severe protein calorie malnutrition - Nutrition consult.  Speech and swallow initially recommended dysphagia 1 diet, family and the patient thinks soft diet would be appropriate as she is doing better.  We will advance her diet.  Acute metabolic encephalopathy versus delirium - This is resolved.  We will plan on continuing to avoid any sedative medications.  Anxiety/depression -Continue Wellbutrin but discontinue the rest.  Spoke with the patient and family.  Generalized weakness -Continue PT/OT   DVT prophylaxis: Hep Code Status: Full code Family Communication: Called patient's daughter and left a voicemail.  Spoke with the patient's spouse [name] and updated.  He requested me to call his son-in-law Dr. Cyndia Bent, left a message. Disposition Plan: Maintain in hospital stay for further cardiac evaluation.  Consultants:   Heart failure  Procedures:   None  Antimicrobials:   Omnicef   Subjective: Patient is up awake alert, does not have any complaints.  She feels much better.   Review of Systems Otherwise negative except as per HPI, including: General = no fevers, chills, dizziness, malaise, fatigue HEENT/EYES = negative for pain, redness, loss of vision, double vision, blurred vision, loss of hearing, sore throat, hoarseness, dysphagia Cardiovascular= negative for chest pain, palpitation, murmurs, lower extremity swelling Respiratory/lungs= negative for shortness of  breath, cough, hemoptysis, wheezing, mucus production Gastrointestinal= negative for  nausea, vomiting,, abdominal pain, melena, hematemesis Genitourinary= negative for Dysuria, Hematuria, Change in Urinary Frequency MSK = Negative for arthralgia, myalgias, Back Pain, Joint swelling  Neurology= Negative for headache, seizures, numbness, tingling  Psychiatry= Negative for anxiety, depression, suicidal and homocidal ideation Allergy/Immunology= Medication/Food allergy as listed  Skin= Negative for Rash, lesions, ulcers, itching   Objective: Vitals:   02/09/19 0020 02/09/19 0304 02/09/19 0427 02/09/19 0902  BP: (!) 116/91  102/71   Pulse: 71  (!) 44 88  Resp: 17 15 17    Temp: 98.1 F (36.7 C)  97.8 F (36.6 C)   TempSrc: Oral  Oral   SpO2: 99%  94%   Weight:  51.4 kg    Height:        Intake/Output Summary (Last 24 hours) at 02/09/2019 1214 Last data filed at 02/09/2019 1100 Gross per 24 hour  Intake 563.7 ml  Output -  Net 563.7 ml   Filed Weights   02/07/19 0500 02/08/19 0452 02/09/19 0304  Weight: 47.6 kg 53.5 kg 51.4 kg    Examination:  Constitutional: NAD, calm, comfortable Eyes: PERRL, lids and conjunctivae normal ENMT: Mucous membranes are moist. Posterior pharynx clear of any exudate or lesions.Normal dentition.  Neck: normal, supple, no masses, no thyromegaly Respiratory: clear to auscultation bilaterally, no wheezing, no crackles. Normal respiratory effort. No accessory muscle use.  Cardiovascular: Regular rate and rhythm, no murmurs / rubs / gallops. No extremity edema. 2+ pedal pulses. No carotid bruits.  Abdomen: no tenderness, no masses palpated. No hepatosplenomegaly. Bowel sounds positive.  Musculoskeletal: no clubbing / cyanosis. No joint deformity upper and lower extremities. Good ROM, no contractures. Normal muscle tone.  Skin: no rashes, lesions, ulcers. No induration Neurologic: CN 2-12 grossly intact. Sensation intact, DTR normal. Strength 5/5 in all 4.   Psychiatric: Normal judgment and insight. Alert and oriented x 3. Normal mood.     Data Reviewed:   CBC: Recent Labs  Lab 02/04/19 0426 02/05/19 0423 02/06/19 0400 02/07/19 0632 02/08/19 0242 02/09/19 0444  WBC 11.6* 7.2 7.4 9.3 9.0 9.8  NEUTROABS 8.0* 5.1  --   --   --   --   HGB 11.6* 10.8* 9.8* 12.8 12.3 13.2  HCT 36.2 33.4* 30.2* 38.6 37.4 39.4  MCV 100.0 98.8 98.4 96.3 97.1 95.9  PLT 224 152 164 250 240 767   Basic Metabolic Panel: Recent Labs  Lab 02/03/19 0412  02/04/19 0426 02/05/19 0423  02/06/19 0400 02/07/19 0053 02/07/19 0632 02/08/19 0242 02/09/19 0444  NA 141   < >  --   --    < > 144 141 142 140 139  K 3.8   < >  --   --    < > 3.1* 3.4* 3.6 3.7 3.5  CL 112*   < >  --   --    < > 108 102 100 102 101  CO2 24   < >  --   --    < > 27 26 29 27 27   GLUCOSE 100*   < >  --   --    < > 109* 112* 110* 101* 98  BUN 17   < >  --   --    < > 14 14 12 12 12   CREATININE 0.98   < >  --   --    < > 0.88 0.91 0.79 0.85 0.83  CALCIUM 8.6*   < >  --   --    < >  8.6* 9.4 9.5 9.3 9.1  MG 1.8  --  1.9 1.8  --  1.8 2.1 2.0 1.9 1.6*  PHOS 2.0*  --  1.6* 3.7  --   --   --   --   --   --    < > = values in this interval not displayed.   GFR: Estimated Creatinine Clearance: 49 mL/min (by C-G formula based on SCr of 0.83 mg/dL). Liver Function Tests: Recent Labs  Lab 02/04/19 0426 02/09/19 0444  AST 123* 22  ALT 231* 81*  ALKPHOS 59 50  BILITOT 0.6 1.1  PROT 4.9* 5.4*  ALBUMIN 2.4* 3.0*   Recent Labs  Lab 02/04/19 0426  LIPASE 23  AMYLASE 13*   No results for input(s): AMMONIA in the last 168 hours. Coagulation Profile: Recent Labs  Lab 02/04/19 0426  INR 1.3*   Cardiac Enzymes: Recent Labs  Lab 02/02/19 1719 02/02/19 2152  TROPONINI 1.65* 1.36*   BNP (last 3 results) No results for input(s): PROBNP in the last 8760 hours. HbA1C: No results for input(s): HGBA1C in the last 72 hours. CBG: Recent Labs  Lab 02/08/19 2051 02/09/19 0023 02/09/19  0431 02/09/19 0859 02/09/19 1159  GLUCAP 116* 85 96 116* 100*   Lipid Profile: No results for input(s): CHOL, HDL, LDLCALC, TRIG, CHOLHDL, LDLDIRECT in the last 72 hours. Thyroid Function Tests: No results for input(s): TSH, T4TOTAL, FREET4, T3FREE, THYROIDAB in the last 72 hours. Anemia Panel: No results for input(s): VITAMINB12, FOLATE, FERRITIN, TIBC, IRON, RETICCTPCT in the last 72 hours. Sepsis Labs: Recent Labs  Lab 02/04/19 0045 02/09/19 0444  PROCALCITON  --  <0.10  LATICACIDVEN 1.2  --     Recent Results (from the past 240 hour(s))  Novel Coronavirus, NAA (hospital order; send-out to ref lab)     Status: None   Collection Time: 02/01/19  3:25 PM   Specimen: Nasopharyngeal Swab; Respiratory  Result Value Ref Range Status   SARS-CoV-2, NAA NOT DETECTED NOT DETECTED Final    Comment: (NOTE) This test was developed and its performance characteristics determined by [employer]. This test has not been FDA cleared or approved. This test has been authorized by FDA under an Emergency Use Authorization (EUA). This test is only authorized for the duration of time the declaration that circumstances exist justifying the authorization of the emergency use of in vitro diagnostic tests for detection of SARS-CoV-2 virus and/or diagnosis of COVID-19 infection under section 564(b)(1) of the Act, 21 U.S.C. 546EVO-3(J)(0), unless the authorization is terminated or revoked sooner. When diagnostic testing is negative, the possibility of a false negative result should be considered in the context of a patient's recent exposures and the presence of clinical signs and symptoms consistent with COVID-19. An individual without symptoms of COVID-19 and who is not shedding SARS-CoV-2 virus would expect to have a negative (not detected) result in this assay. Performed  At: [facility] [address] [place], [place] [id number] Rush Farmer MD BZ:[id number]    [place]  Final    Comment: Performed at [facility], [place] [address], [place], [place] [address]  SARS Coronavirus 2     Status: None   Collection Time: 02/01/19  5:05 PM  Result Value Ref Range Status   SARS Coronavirus 2 NOT DETECTED NOT DETECTED Final    Comment: (NOTE) SARS-CoV-2 target nucleic acids are NOT DETECTED. The SARS-CoV-2 RNA is generally detectable in upper and lower respiratory specimens during the acute phase of  infection.  Negative  results do not preclude SARS-CoV-2 infection, do not rule out co-infections with other pathogens, and should not be used as the sole basis for treatment or other patient management decisions.  Negative results must be combined with clinical observations, patient history, and epidemiological information. The expected result is Not Detected. Fact Sheet for Patients: http://www.biofiredefense.com/wp-content/uploads/2020/03/BIOFIRE-COVID -19-patients.pdf Fact Sheet for Healthcare Providers: http://www.biofiredefense.com/wp-content/uploads/2020/03/BIOFIRE-COVID -19-hcp.pdf This test is not yet approved or cleared by the [place] and  has been authorized for detection and/or diagnosis of SARS-CoV-2 by FDA under an Emergency Use Authorization (EUA).  This EUA will remain in effec t (meaning this test can be used) for the duration of  the COVID-19 declaration under Section 564(b)(1) of the Act, 21 U.S.C. section 360bbb-3(b)(1), unless the authorization is terminated or revoked sooner. Performed at [facility], [place] [address], [place], [place] [address]   MRSA PCR Screening     Status: None   Collection Time: 02/01/19  8:46 PM   Specimen: Nasopharyngeal  Result Value Ref Range Status   MRSA by PCR NEGATIVE NEGATIVE Final    Comment:        The GeneXpert MRSA Assay (FDA approved for NASAL specimens only), is one component of a comprehensive MRSA colonization surveillance program. It is not intended  to diagnose MRSA infection nor to guide or monitor treatment for MRSA infections. Performed at [facility], [place] [address], [place], [place] [address]   Culture, blood (routine x 2)     Status: None   Collection Time: 02/01/19  9:28 PM   Specimen: BLOOD  Result Value Ref Range Status   Specimen Description BLOOD RIGHT ANTECUBITAL  Final   Special Requests   Final    BOTTLES DRAWN AEROBIC ONLY Blood Culture adequate volume   Culture   Final    NO GROWTH 5 DAYS Performed at [facility], [place] [address], [place], [place] [address]    Report Status 02/06/2019 FINAL  Final  Culture, blood (routine x 2)     Status: None   Collection Time: 02/01/19  9:28 PM   Specimen: BLOOD  Result Value Ref Range Status   Specimen Description BLOOD RIGHT ANTECUBITAL  Final   Special Requests   Final    BOTTLES DRAWN AEROBIC ONLY Blood Culture adequate volume   Culture   Final    NO GROWTH 5 DAYS Performed at [facility], [place] [address] [place], [place] [address]    Report Status 02/06/2019 FINAL  Final  Culture, respiratory (non-expectorated)     Status: None   Collection Time: 02/02/19  4:40 PM   Specimen: Tracheal Aspirate; Respiratory  Result Value Ref Range Status   Specimen Description TRACHEAL ASPIRATE  Final   Special Requests Normal  Final   Gram Stain   Final    RARE WBC PRESENT,BOTH PMN AND MONONUCLEAR RARE GRAM POSITIVE COCCI IN PAIRS Performed at [facility], [place] [address], [place],  [address]    Culture MODERATE STAPHYLOCOCCUS AUREUS  Final   Report Status 02/05/2019 FINAL  Final   Organism ID, Bacteria STAPHYLOCOCCUS AUREUS  Final      Susceptibility   Staphylococcus aureus - MIC*    CIPROFLOXACIN <=0.5 SENSITIVE Sensitive     ERYTHROMYCIN <=0.25 SENSITIVE Sensitive     GENTAMICIN <=0.5 SENSITIVE Sensitive     OXACILLIN <=0.25 SENSITIVE Sensitive     TETRACYCLINE <=1 SENSITIVE Sensitive     VANCOMYCIN 1 SENSITIVE  Sensitive      TRIMETH/SULFA <=10 SENSITIVE Sensitive     CLINDAMYCIN <=0.25 SENSITIVE Sensitive     RIFAMPIN <=0.5 SENSITIVE Sensitive     Inducible Clindamycin NEGATIVE Sensitive     * MODERATE STAPHYLOCOCCUS AUREUS  Respiratory Panel by PCR     Status: None   Collection Time: 02/03/19  9:33 AM   Specimen: Nasopharyngeal Swab; Respiratory  Result Value Ref Range Status   Adenovirus NOT DETECTED NOT DETECTED Final   Coronavirus 229E NOT DETECTED NOT DETECTED Final    Comment: (NOTE) The Coronavirus on the Respiratory Panel, DOES NOT test for the novel  Coronavirus (2019 nCoV)    Coronavirus HKU1 NOT DETECTED NOT DETECTED Final   Coronavirus NL63 NOT DETECTED NOT DETECTED Final   Coronavirus OC43 NOT DETECTED NOT DETECTED Final   Metapneumovirus NOT DETECTED NOT DETECTED Final   Rhinovirus / Enterovirus NOT DETECTED NOT DETECTED Final   Influenza A NOT DETECTED NOT DETECTED Final   Influenza B NOT DETECTED NOT DETECTED Final   Parainfluenza Virus 1 NOT DETECTED NOT DETECTED Final   Parainfluenza Virus 2 NOT DETECTED NOT DETECTED Final   Parainfluenza Virus 3 NOT DETECTED NOT DETECTED Final   Parainfluenza Virus 4 NOT DETECTED NOT DETECTED Final   Respiratory Syncytial Virus NOT DETECTED NOT DETECTED Final   Bordetella pertussis NOT DETECTED NOT DETECTED Final   Chlamydophila pneumoniae NOT DETECTED NOT DETECTED Final   Mycoplasma pneumoniae NOT DETECTED NOT DETECTED Final    Comment: Performed at [facility], [place]. [address] [place], [place] [address]  Body fluid culture (includes gram stain)     Status: None   Collection Time: 02/04/19  1:10 PM   Specimen: Pleural Fluid  Result Value Ref Range Status   Specimen Description PLEURAL  Final   Special Requests FLUID  Final   Gram Stain   Final    ABUNDANT WBC PRESENT,BOTH PMN AND MONONUCLEAR NO ORGANISMS SEEN    Culture   Final    NO GROWTH Performed at [facility], [address], [place], [place] [address]    Report Status  02/07/2019 FINAL  Final         Radiology Studies: Dg Chest Port 1 View  Result Date: 02/08/2019 CLINICAL DATA:  73 year old female with respiratory failure. EXAM: PORTABLE CHEST 1 VIEW COMPARISON:  02/05/2019 FINDINGS: Cardiomegaly, pulmonary vascular congestion and interstitial opacities/edema again noted. Bilateral LOWER lung atelectasis/consolidation and probable effusions noted. There is no evidence of pneumothorax. LEFT IJ central venous catheter has been removed. IMPRESSION: LEFT central venous catheter removal, otherwise unchanged appearance of the chest with cardiomegaly, interstitial opacities/edema, probable pleural effusions and bibasilar atelectasis/consolidation. Electronically Signed   By: Margarette Canada M.D.   On: 02/08/2019 14:36        Scheduled Meds: . atorvastatin  10 mg Oral Daily  . bisoprolol  2.5 mg Oral Daily  . buPROPion  150 mg Oral Daily  . cefdinir  300 mg Oral Q12H  . Chlorhexidine Gluconate Cloth  6 each Topical Daily  . cloNIDine  0.1 mg Transdermal Weekly  . digoxin  0.125 mg Oral Daily  . feeding supplement (ENSURE ENLIVE)  237 mL Oral TID BM  . fentaNYL  1 patch Transdermal Q72H  . furosemide  40 mg Oral Daily  . insulin aspart  0-15 Units Subcutaneous Q4H  . levothyroxine  88 mcg Oral Daily  . losartan  25 mg Oral Daily  . mouth rinse  15 mL Mouth Rinse  BID  . potassium chloride  30 mEq Oral Q6H  . spironolactone  12.5 mg Oral Daily   Continuous Infusions: . sodium chloride    . heparin 1,200 Units/hr (02/09/19 1100)  . magnesium sulfate bolus IVPB 50 mL/hr at 02/09/19 1100     LOS: 7 days   Time spent= 30 mins    Makaelah Cranfield Arsenio Loader, MD [facility]  If 7PM-7AM, please contact night-coverage [email or web address] 02/09/2019, 12:14 PM

## 2019-02-09 NOTE — Progress Notes (Signed)
[facility] for Heparin Indication: atrial fibrillation  Allergies  Allergen Reactions  . Penicillins     Causes rash Has patient had a PCN reaction causing immediate rash, facial/tongue/throat swelling, SOB or lightheadedness with hypotension: YES Has patient had a PCN reaction causing severe rash involving mucus membranes or skin necrosis: No Has patient had a PCN reaction that required hospitalization No Has patient had a PCN reaction occurring within the last 10 years: No If all of the above answers are "NO", then may proceed with Cephalosporin use.     Patient Measurements: Height: 5\' 5"  (165.1 cm) Weight: 113 lb 6.4 oz (51.4 kg) IBW/kg (Calculated) : 57 Heparin Dosing Weight: 53.2 kg  Vital Signs: Temp: 97.8 F (36.6 C) (06/21 0427) Temp Source: Oral (06/21 0427) BP: 102/71 (06/21 0427) Pulse Rate: 44 (06/21 0427)  Labs: Recent Labs    02/07/19 5397  02/07/19 2233 02/08/19 0242 02/09/19 0444  HGB 12.8  --   --  12.3 13.2  HCT 38.6  --   --  37.4 39.4  PLT 250  --   --  240 265  HEPARINUNFRC 0.34   < > 0.36 0.47 0.43  CREATININE 0.79  --   --  0.85 0.83   < > = values in this interval not displayed.    Estimated Creatinine Clearance: 49 mL/min (by C-G formula based on SCr of 0.83 mg/dL).   Assessment: 73 y.o. female with h/o Afib, PTA apixaban on hold, for heparin. Pt seems to be sensitive to rate changes.  Heparin level came therapeutic today at 0.43, on 1200 units/hr. Hgb 13.2, plt remains WNL. No overt bleeding noted.  Goal of Therapy:  Heparin level 0.3-0.7 Monitor platelets by anticoagulation protocol: Yes   Plan:  Continue heparin IV 1200 units/hr. Continue to watch Hgb and pltc. Monitor signs/symptoms of bleeding. F/u ability to resume oral anticoagulation.   Juanell Fairly, PharmD PGY1 Pharmacy Resident  02/09/2019 8:05 AM

## 2019-02-09 NOTE — Progress Notes (Signed)
Pt declines watching the video.

## 2019-02-10 ENCOUNTER — Inpatient Hospital Stay (HOSPITAL_COMMUNITY): Payer: Medicare Other

## 2019-02-10 ENCOUNTER — Encounter (HOSPITAL_COMMUNITY): Payer: Self-pay | Admitting: Internal Medicine

## 2019-02-10 ENCOUNTER — Encounter (HOSPITAL_COMMUNITY)
Admission: EM | Disposition: A | Discharge status: Rehabilitation Facility | Payer: Self-pay | Source: Home / Self Care | Attending: Internal Medicine

## 2019-02-10 DIAGNOSIS — I361 Nonrheumatic tricuspid (valve) insufficiency: Secondary | ICD-10-CM

## 2019-02-10 HISTORY — PX: RIGHT/LEFT HEART CATH AND CORONARY ANGIOGRAPHY: CATH118266

## 2019-02-10 LAB — GLUCOSE, CAPILLARY
Glucose-Capillary: 100 mg/dL — ABNORMAL HIGH (ref 70–99)
Glucose-Capillary: 127 mg/dL — ABNORMAL HIGH (ref 70–99)
Glucose-Capillary: 93 mg/dL (ref 70–99)
Glucose-Capillary: 94 mg/dL (ref 70–99)
Glucose-Capillary: 96 mg/dL (ref 70–99)
Glucose-Capillary: 97 mg/dL (ref 70–99)
Glucose-Capillary: 98 mg/dL (ref 70–99)

## 2019-02-10 LAB — POCT I-STAT EG7
Acid-Base Excess: 2 mmol/L (ref 0.0–2.0)
Acid-Base Excess: 4 mmol/L — ABNORMAL HIGH (ref 0.0–2.0)
Bicarbonate: 26.9 mmol/L (ref 20.0–28.0)
Bicarbonate: 28.9 mmol/L — ABNORMAL HIGH (ref 20.0–28.0)
Calcium, Ion: 1.1 mmol/L — ABNORMAL LOW (ref 1.15–1.40)
Calcium, Ion: 1.25 mmol/L (ref 1.15–1.40)
HCT: 39 % (ref 36.0–46.0)
HCT: 41 % (ref 36.0–46.0)
Hemoglobin: 13.3 g/dL (ref 12.0–15.0)
Hemoglobin: 13.9 g/dL (ref 12.0–15.0)
O2 Saturation: 65 %
O2 Saturation: 65 %
Potassium: 3.9 mmol/L (ref 3.5–5.1)
Potassium: 4.4 mmol/L (ref 3.5–5.1)
Sodium: 136 mmol/L (ref 135–145)
Sodium: 137 mmol/L (ref 135–145)
TCO2: 28 mmol/L (ref 22–32)
TCO2: 30 mmol/L (ref 22–32)
pCO2, Ven: 42.5 mmHg — ABNORMAL LOW (ref 44.0–60.0)
pCO2, Ven: 44.5 mmHg (ref 44.0–60.0)
pH, Ven: 7.409 (ref 7.250–7.430)
pH, Ven: 7.42 (ref 7.250–7.430)
pO2, Ven: 34 mmHg (ref 32.0–45.0)
pO2, Ven: 34 mmHg (ref 32.0–45.0)

## 2019-02-10 LAB — COMPREHENSIVE METABOLIC PANEL
ALT: 65 U/L — ABNORMAL HIGH (ref 0–44)
AST: 21 U/L (ref 15–41)
Albumin: 3.2 g/dL — ABNORMAL LOW (ref 3.5–5.0)
Alkaline Phosphatase: 53 U/L (ref 38–126)
Anion gap: 10 (ref 5–15)
BUN: 10 mg/dL (ref 8–23)
CO2: 25 mmol/L (ref 22–32)
Calcium: 9.4 mg/dL (ref 8.9–10.3)
Chloride: 102 mmol/L (ref 98–111)
Creatinine, Ser: 0.92 mg/dL (ref 0.44–1.00)
GFR calc Af Amer: >60 mL/min (ref >60)
GFR calc non Af Amer: >60 mL/min (ref >60)
Glucose, Bld: 95 mg/dL (ref 70–99)
Potassium: 4.3 mmol/L (ref 3.5–5.1)
Sodium: 137 mmol/L (ref 135–145)
Total Bilirubin: 0.9 mg/dL (ref 0.3–1.2)
Total Protein: 5.4 g/dL — ABNORMAL LOW (ref 6.5–8.1)

## 2019-02-10 LAB — POCT I-STAT 7, (LYTES, BLD GAS, ICA,H+H)
Acid-Base Excess: 4 mmol/L — ABNORMAL HIGH (ref 0.0–2.0)
Bicarbonate: 27.5 mmol/L (ref 20.0–28.0)
Calcium, Ion: 1.14 mmol/L — ABNORMAL LOW (ref 1.15–1.40)
HCT: 39 % (ref 36.0–46.0)
Hemoglobin: 13.3 g/dL (ref 12.0–15.0)
O2 Saturation: 99 %
Potassium: 4.3 mmol/L (ref 3.5–5.1)
Sodium: 139 mmol/L (ref 135–145)
TCO2: 29 mmol/L (ref 22–32)
pCO2 arterial: 37.2 mmHg (ref 32.0–48.0)
pH, Arterial: 7.478 — ABNORMAL HIGH (ref 7.350–7.450)
pO2, Arterial: 143 mmHg — ABNORMAL HIGH (ref 83.0–108.0)

## 2019-02-10 LAB — MAGNESIUM: Magnesium: 2.3 mg/dL (ref 1.7–2.4)

## 2019-02-10 LAB — ECHOCARDIOGRAM LIMITED
Height: 65 in
Weight: 1814.4 oz

## 2019-02-10 LAB — CBC
HCT: 42.4 % (ref 36.0–46.0)
Hemoglobin: 14.1 g/dL (ref 12.0–15.0)
MCH: 32.2 pg (ref 26.0–34.0)
MCHC: 33.3 g/dL (ref 30.0–36.0)
MCV: 96.8 fL (ref 80.0–100.0)
Platelets: 274 10*3/uL (ref 150–400)
RBC: 4.38 MIL/uL (ref 3.87–5.11)
RDW: 13.4 % (ref 11.5–15.5)
WBC: 9 10*3/uL (ref 4.0–10.5)
nRBC: 0 % (ref 0.0–0.2)

## 2019-02-10 LAB — HEPARIN LEVEL (UNFRACTIONATED): Heparin Unfractionated: 0.74 IU/mL — ABNORMAL HIGH (ref 0.30–0.70)

## 2019-02-10 SURGERY — RIGHT/LEFT HEART CATH AND CORONARY ANGIOGRAPHY
Anesthesia: LOCAL

## 2019-02-10 MED ORDER — LIDOCAINE HCL (PF) 1 % IJ SOLN
INTRAMUSCULAR | Status: AC
  Filled 2019-02-10: qty 30

## 2019-02-10 MED ORDER — HEPARIN (PORCINE) IN NACL 1000-0.9 UT/500ML-% IV SOLN
INTRAVENOUS | Status: DC | PRN
  Administered 2019-02-10 (×3): 500 mL

## 2019-02-10 MED ORDER — HYDRALAZINE HCL 20 MG/ML IJ SOLN: 10.0000 mg | INTRAMUSCULAR | Status: AC | PRN

## 2019-02-10 MED ORDER — VERAPAMIL HCL 2.5 MG/ML IV SOLN
INTRAVENOUS | Status: DC | PRN
  Administered 2019-02-10: 10 mL via INTRA_ARTERIAL

## 2019-02-10 MED ORDER — SODIUM CHLORIDE 0.9 % IV SOLN: 250.0000 mL | INTRAVENOUS | Status: DC | PRN

## 2019-02-10 MED ORDER — ONDANSETRON HCL 4 MG/2ML IJ SOLN
4.0000 mg | Freq: Four times a day (QID) | INTRAMUSCULAR | Status: DC | PRN
  Administered 2019-02-11 (×2): 4 mg via INTRAVENOUS
  Filled 2019-02-10 (×2): qty 2

## 2019-02-10 MED ORDER — ACETAMINOPHEN 325 MG PO TABS
650.0000 mg | ORAL_TABLET | ORAL | Status: DC | PRN
  Filled 2019-02-10: qty 2

## 2019-02-10 MED ORDER — CALCIUM CARBONATE ANTACID 500 MG PO CHEW
2.0000 | CHEWABLE_TABLET | Freq: Four times a day (QID) | ORAL | Status: AC | PRN
  Administered 2019-02-10: 400 mg via ORAL
  Filled 2019-02-10: qty 2

## 2019-02-10 MED ORDER — SODIUM CHLORIDE 0.9% FLUSH: 3.0000 mL | INTRAVENOUS | Status: DC | PRN

## 2019-02-10 MED ORDER — IOHEXOL 350 MG/ML SOLN
INTRAVENOUS | Status: DC | PRN
  Administered 2019-02-10: 30 mL via INTRA_ARTERIAL

## 2019-02-10 MED ORDER — LIDOCAINE HCL (PF) 1 % IJ SOLN
INTRAMUSCULAR | Status: DC | PRN
  Administered 2019-02-10 (×3): 2 mL

## 2019-02-10 MED ORDER — SODIUM CHLORIDE 0.9% FLUSH
3.0000 mL | Freq: Two times a day (BID) | INTRAVENOUS | Status: DC
  Administered 2019-02-10 – 2019-02-13 (×6): 3 mL via INTRAVENOUS

## 2019-02-10 MED ORDER — SODIUM CHLORIDE 0.9 % IV SOLN: INTRAVENOUS | Status: AC

## 2019-02-10 MED ORDER — VERAPAMIL HCL 2.5 MG/ML IV SOLN
INTRAVENOUS | Status: AC
  Filled 2019-02-10: qty 2

## 2019-02-10 MED ORDER — LABETALOL HCL 5 MG/ML IV SOLN: 10.0000 mg | INTRAVENOUS | Status: AC | PRN

## 2019-02-10 MED ORDER — HEPARIN (PORCINE) IN NACL 1000-0.9 UT/500ML-% IV SOLN
INTRAVENOUS | Status: AC
  Filled 2019-02-10: qty 1500

## 2019-02-10 MED ORDER — FLUOXETINE HCL 20 MG PO CAPS
20.0000 mg | ORAL_CAPSULE | Freq: Every day | ORAL | Status: DC
  Administered 2019-02-10 – 2019-02-13 (×3): 20 mg via ORAL
  Filled 2019-02-10 (×4): qty 1

## 2019-02-10 SURGICAL SUPPLY — 16 items
CATH BALLN WEDGE 5F 110CM (CATHETERS) ×1 IMPLANT
CATH INFINITI 4FR JL3.5 (CATHETERS) ×1 IMPLANT
CATH INFINITI MULTIPACK ANG 4F (CATHETERS) ×1 IMPLANT
COVER DOME SNAP 22 D (MISCELLANEOUS) ×1 IMPLANT
DEVICE RAD TR BAND REGULAR (VASCULAR PRODUCTS) ×1 IMPLANT
GLIDESHEATH SLEND SS 6F .021 (SHEATH) IMPLANT
GUIDEWIRE .025 260CM (WIRE) ×1 IMPLANT
GUIDEWIRE INQWIRE 1.5J.035X260 (WIRE) IMPLANT
INQWIRE 1.5J .035X260CM (WIRE) ×2
KIT HEART LEFT (KITS) ×2 IMPLANT
KIT MICROPUNCTURE NIT STIFF (SHEATH) ×1 IMPLANT
PACK CARDIAC CATHETERIZATION (CUSTOM PROCEDURE TRAY) ×2 IMPLANT
SHEATH GLIDE SLENDER 4/5FR (SHEATH) ×3 IMPLANT
SHEATH PROBE COVER 6X72 (BAG) ×1 IMPLANT
TRANSDUCER W/STOPCOCK (MISCELLANEOUS) ×2 IMPLANT
TUBING CIL FLEX 10 FLL-RA (TUBING) ×2 IMPLANT

## 2019-02-10 NOTE — Progress Notes (Signed)
  Speech Language Pathology Treatment: Dysphagia  Patient Details Name: [name] MRN: [id number] DOB: Sep 10, 1945 Today's Date: 02/10/2019 Time: 0076-2263 SLP Time Calculation (min) (ACUTE ONLY): 22 min  Assessment / Plan / Recommendation Clinical Impression  Pt appears much more alert and less confused than during previous sessions.  Pt fed herself regular solids without any difficulty.  There were no clinical s/s of aspiration and pt achieved good oral clearance even without upper dentures in place.  Pt is eager to advance to regular texture diet.  Pt participated in conversation with SLP appropriately and with no difficulty, including relaying a recipe for tiramisu, which this therapist much appreciated.  Recommend regular texture diet with thin liquid.    HPI HPI: 73 year old female who initially presented on 6/13 with nausea, vomiting and diarrhea and found with profound hypokalemia and hypomagnesemia. Intubated 6/13-6/16. Acute hypoxemic respiratory failure with combination of fluid overload and CAP, question aspiration although sputum now growing Staph. most recent CT Continued opacity throughout the right lung. There is likely a component of layering effusion. The underlying opacity could represent asymmetric edema, atelectasis, or infiltrate. Pt underwent R thoracentesis on 6/15. Esophageal dilatation in 2000.      SLP Plan  Continue with current plan of care       Recommendations  Diet recommendations: Regular Medication Administration: Whole meds with liquid Supervision: Patient able to self feed Postural Changes and/or Swallow Maneuvers: Seated upright 90 degrees                Oral Care Recommendations: Oral care BID SLP Visit Diagnosis: Dysphagia, unspecified (R13.10) Plan: Continue with current plan of care       [place], MA, [place]; Pager (6/22):  [phone]  02/10/2019, 2:51 PM

## 2019-02-10 NOTE — H&P (View-Only) (Signed)
Advanced Heart Failure Rounding Note   Subjective:    Had R thoracentesis on 6/16.   Sitting up in bed. Feels ok. No CP or SOB. Limited echo done yesterday EF back to 50-55%. Personally reviewed   AF rates in 80-90s. No bleeding on heparin.   Objective:   Weight Range:  Vital Signs:   Temp:  [98.1 F (36.7 C)] 98.1 F (36.7 C) (06/21 2019) Pulse Rate:  [88-99] 99 (06/21 2019) Resp:  [13-17] 14 (06/22 0400) BP: (104-140)/(67-117) 140/97 (06/22 0400) SpO2:  [98 %] 98 % (06/21 2019) Last BM Date: 02/09/19  Weight change: Filed Weights   02/07/19 0500 02/08/19 0452 02/09/19 0304  Weight: 47.6 kg 53.5 kg 51.4 kg    Intake/Output:   Intake/Output Summary (Last 24 hours) at 02/10/2019 0821 Last data filed at 02/09/2019 1100 Gross per 24 hour  Intake 303.82 ml  Output -  Net 303.82 ml     Physical Exam: General: Sitting up in bed No resp difficulty HEENT: normal Neck: supple. no JVD. Carotids 2+ bilat; no bruits. No lymphadenopathy or thryomegaly appreciated. Cor: PMI nondisplaced. Irregular rate & rhythm. No rubs, gallops or murmurs. Lungs: clear with decreased BS throughtout Abdomen: soft, nontender, nondistended. No hepatosplenomegaly. No bruits or masses. Good bowel sounds. Extremities: no cyanosis, clubbing, rash, edema + arthritic changes Neuro: alert & orientedx3, cranial nerves grossly intact. moves all 4 extremities w/o difficulty. Affect pleasant   Telemetry:  AF 80-90s Personally reviewed   Labs: Basic Metabolic Panel: Recent Labs  Lab 02/04/19 0426 02/05/19 0423  02/07/19 0053 02/07/19 2633 02/08/19 0242 02/09/19 0444 02/10/19 0421  NA  --   --    < > 141 142 140 139 137  K  --   --    < > 3.4* 3.6 3.7 3.5 4.3  CL  --   --    < > 102 100 102 101 102  CO2  --   --    < > 26 29 27 27 25   GLUCOSE  --   --    < > 112* 110* 101* 98 95  BUN  --   --    < > 14 12 12 12 10   CREATININE  --   --    < > 0.91 0.79 0.85 0.83 0.92  CALCIUM  --   --     < > 9.4 9.5 9.3 9.1 9.4  MG 1.9 1.8   < > 2.1 2.0 1.9 1.6* 2.3  PHOS 1.6* 3.7  --   --   --   --   --   --    < > = values in this interval not displayed.    Liver Function Tests: Recent Labs  Lab 02/04/19 0426 02/09/19 0444 02/10/19 0421  AST 123* 22 21  ALT 231* 81* 65*  ALKPHOS 59 50 53  BILITOT 0.6 1.1 0.9  PROT 4.9* 5.4* 5.4*  ALBUMIN 2.4* 3.0* 3.2*   Recent Labs  Lab 02/04/19 0426  LIPASE 23  AMYLASE 13*   No results for input(s): AMMONIA in the last 168 hours.  CBC: Recent Labs  Lab 02/04/19 0426 02/05/19 0423 02/06/19 0400 02/07/19 0632 02/08/19 0242 02/09/19 0444 02/10/19 0421  WBC 11.6* 7.2 7.4 9.3 9.0 9.8 9.0  NEUTROABS 8.0* 5.1  --   --   --   --   --   HGB 11.6* 10.8* 9.8* 12.8 12.3 13.2 14.1  HCT 36.2 33.4* 30.2* 38.6 37.4 39.4 42.4  MCV 100.0 98.8 98.4 96.3 97.1 95.9 96.8  PLT 224 152 164 250 240 265 274    Cardiac Enzymes: No results for input(s): CKTOTAL, CKMB, CKMBINDEX, TROPONINI in the last 168 hours.  BNP: BNP (last 3 results) Recent Labs    02/09/19 0444  BNP 1,708.7*    ProBNP (last 3 results) No results for input(s): PROBNP in the last 8760 hours.    Other results:  Imaging: No results found.   Medications:     Scheduled Medications: . atorvastatin  10 mg Oral Daily  . bisoprolol  2.5 mg Oral Daily  . buPROPion  150 mg Oral Daily  . cefdinir  300 mg Oral Q12H  . Chlorhexidine Gluconate Cloth  6 each Topical Daily  . cloNIDine  0.1 mg Transdermal Weekly  . digoxin  0.125 mg Oral Daily  . feeding supplement (ENSURE ENLIVE)  237 mL Oral TID BM  . fentaNYL  1 patch Transdermal Q72H  . furosemide  40 mg Oral Daily  . insulin aspart  0-15 Units Subcutaneous Q4H  . levothyroxine  88 mcg Oral Daily  . losartan  25 mg Oral Daily  . mouth rinse  15 mL Mouth Rinse BID  . sodium chloride flush  3 mL Intravenous Q12H  . spironolactone  12.5 mg Oral Daily    Infusions: . sodium chloride    . sodium chloride    .  sodium chloride 10 mL/hr at 02/10/19 0158  . heparin 1,200 Units/hr (02/09/19 1100)    PRN Medications: sodium chloride, sodium chloride, acetaminophen **OR** acetaminophen, albuterol, fluticasone, hydrALAZINE, methocarbamol, metoprolol tartrate, naLOXone (NARCAN)  injection, ondansetron **OR** ondansetron (ZOFRAN) IV, polyethylene glycol, rOPINIRole, senna-docusate, sodium chloride flush   Assessment/plan :   1. Acute on chronic hypoxic respiratory failure in setting of aspiration PNA - has severe underlying COPD on CXR. Per chart quit tobacco in 2001. No PFTs on chart - admitted with severe R-sided PNA likely due to aspiration. - intubated 6/13. Extubated 6/16 - much improved after R thors 6/16 (transudate)  - Keep dry with diuretics. BNP still elevated but clinically looks dry.  - Continue lasix 40 daily  2. Chronic AF with RVR - rate much improved on bisoprolol 2.5. will continue - Eliquis switched to heparin for cath. Will switch back to Eliquis after cath today - results permitting  3. Acute systolic HF - echo from 7/62 shows EF 20% with Tako-tsubo pattern. Suspect stress CM - Limited echo yesterday EF 55% Personally reviewed - Continue low-dose losartan, spiro and digoxin and po lasix - Cath today - LV looks thick. Will send myeloma panel. Consider PYP scan.   4. NSTEMI - Troponin elevated in 2-3 range. Now coming down. No CP ECG with new iRBBB and mild lateral TW changes - suspect demand ischemia in setting of severe medical illness.  - however echo from 6/14 suggestive of Tako-Tsubo EF 20% - continue heparin, ASA, statin - cath in am   5. Hypokalemia/hypomag - replete.  6. Chronic GI issues and malnutrition - now with aspiration PNA. Improved  7. Acute ICU delirium - Resolved    Length of Stay: 8   Glori Bickers  MD 02/10/2019, 8:21 AM  Advanced Heart Failure Team Pager [phone] (M-F; 7a - 4p)  Please contact [facility] for night-coverage after  hours (4p -7a ) and weekends on [email or web address]

## 2019-02-10 NOTE — Care Management Important Message (Signed)
Important Message  Patient Details  Name: [name] MRN: [id number] Date of Birth: 31-Mar-1946   Medicare Important Message Given:  Yes     Shelda Altes 02/10/2019, 1:01 PM

## 2019-02-10 NOTE — Progress Notes (Signed)
Advanced Heart Failure Rounding Note   Subjective:    Had R thoracentesis on 6/16.   Sitting up in bed. Feels ok. No CP or SOB. Limited echo done yesterday EF back to 50-55%. Personally reviewed   AF rates in 80-90s. No bleeding on heparin.   Objective:   Weight Range:  Vital Signs:   Temp:  [98.1 F (36.7 C)] 98.1 F (36.7 C) (06/21 2019) Pulse Rate:  [88-99] 99 (06/21 2019) Resp:  [13-17] 14 (06/22 0400) BP: (104-140)/(67-117) 140/97 (06/22 0400) SpO2:  [98 %] 98 % (06/21 2019) Last BM Date: 02/09/19  Weight change: Filed Weights   02/07/19 0500 02/08/19 0452 02/09/19 0304  Weight: 47.6 kg 53.5 kg 51.4 kg    Intake/Output:   Intake/Output Summary (Last 24 hours) at 02/10/2019 0821 Last data filed at 02/09/2019 1100 Gross per 24 hour  Intake 303.82 ml  Output -  Net 303.82 ml     Physical Exam: General: Sitting up in bed No resp difficulty HEENT: normal Neck: supple. no JVD. Carotids 2+ bilat; no bruits. No lymphadenopathy or thryomegaly appreciated. Cor: PMI nondisplaced. Irregular rate & rhythm. No rubs, gallops or murmurs. Lungs: clear with decreased BS throughtout Abdomen: soft, nontender, nondistended. No hepatosplenomegaly. No bruits or masses. Good bowel sounds. Extremities: no cyanosis, clubbing, rash, edema + arthritic changes Neuro: alert & orientedx3, cranial nerves grossly intact. moves all 4 extremities w/o difficulty. Affect pleasant   Telemetry:  AF 80-90s Personally reviewed   Labs: Basic Metabolic Panel: Recent Labs  Lab 02/04/19 0426 02/05/19 0423  02/07/19 0053 02/07/19 6440 02/08/19 0242 02/09/19 0444 02/10/19 0421  NA  --   --    < > 141 142 140 139 137  K  --   --    < > 3.4* 3.6 3.7 3.5 4.3  CL  --   --    < > 102 100 102 101 102  CO2  --   --    < > 26 29 27 27 25   GLUCOSE  --   --    < > 112* 110* 101* 98 95  BUN  --   --    < > 14 12 12 12 10   CREATININE  --   --    < > 0.91 0.79 0.85 0.83 0.92  CALCIUM  --   --     < > 9.4 9.5 9.3 9.1 9.4  MG 1.9 1.8   < > 2.1 2.0 1.9 1.6* 2.3  PHOS 1.6* 3.7  --   --   --   --   --   --    < > = values in this interval not displayed.    Liver Function Tests: Recent Labs  Lab 02/04/19 0426 02/09/19 0444 02/10/19 0421  AST 123* 22 21  ALT 231* 81* 65*  ALKPHOS 59 50 53  BILITOT 0.6 1.1 0.9  PROT 4.9* 5.4* 5.4*  ALBUMIN 2.4* 3.0* 3.2*   Recent Labs  Lab 02/04/19 0426  LIPASE 23  AMYLASE 13*   No results for input(s): AMMONIA in the last 168 hours.  CBC: Recent Labs  Lab 02/04/19 0426 02/05/19 0423 02/06/19 0400 02/07/19 0632 02/08/19 0242 02/09/19 0444 02/10/19 0421  WBC 11.6* 7.2 7.4 9.3 9.0 9.8 9.0  NEUTROABS 8.0* 5.1  --   --   --   --   --   HGB 11.6* 10.8* 9.8* 12.8 12.3 13.2 14.1  HCT 36.2 33.4* 30.2* 38.6 37.4 39.4 42.4  MCV 100.0 98.8 98.4 96.3 97.1 95.9 96.8  PLT 224 152 164 250 240 265 274    Cardiac Enzymes: No results for input(s): CKTOTAL, CKMB, CKMBINDEX, TROPONINI in the last 168 hours.  BNP: BNP (last 3 results) Recent Labs    02/09/19 0444  BNP 1,708.7*    ProBNP (last 3 results) No results for input(s): PROBNP in the last 8760 hours.    Other results:  Imaging: No results found.   Medications:     Scheduled Medications: . atorvastatin  10 mg Oral Daily  . bisoprolol  2.5 mg Oral Daily  . buPROPion  150 mg Oral Daily  . cefdinir  300 mg Oral Q12H  . Chlorhexidine Gluconate Cloth  6 each Topical Daily  . cloNIDine  0.1 mg Transdermal Weekly  . digoxin  0.125 mg Oral Daily  . feeding supplement (ENSURE ENLIVE)  237 mL Oral TID BM  . fentaNYL  1 patch Transdermal Q72H  . furosemide  40 mg Oral Daily  . insulin aspart  0-15 Units Subcutaneous Q4H  . levothyroxine  88 mcg Oral Daily  . losartan  25 mg Oral Daily  . mouth rinse  15 mL Mouth Rinse BID  . sodium chloride flush  3 mL Intravenous Q12H  . spironolactone  12.5 mg Oral Daily    Infusions: . sodium chloride    . sodium chloride    .  sodium chloride 10 mL/hr at 02/10/19 0158  . heparin 1,200 Units/hr (02/09/19 1100)    PRN Medications: sodium chloride, sodium chloride, acetaminophen **OR** acetaminophen, albuterol, fluticasone, hydrALAZINE, methocarbamol, metoprolol tartrate, naLOXone (NARCAN)  injection, ondansetron **OR** ondansetron (ZOFRAN) IV, polyethylene glycol, rOPINIRole, senna-docusate, sodium chloride flush   Assessment/plan :   1. Acute on chronic hypoxic respiratory failure in setting of aspiration PNA - has severe underlying COPD on CXR. Per chart quit tobacco in 2001. No PFTs on chart - admitted with severe R-sided PNA likely due to aspiration. - intubated 6/13. Extubated 6/16 - much improved after R thors 6/16 (transudate)  - Keep dry with diuretics. BNP still elevated but clinically looks dry.  - Continue lasix 40 daily  2. Chronic AF with RVR - rate much improved on bisoprolol 2.5. will continue - Eliquis switched to heparin for cath. Will switch back to Eliquis after cath today - results permitting  3. Acute systolic HF - echo from 4/40 shows EF 20% with Tako-tsubo pattern. Suspect stress CM - Limited echo yesterday EF 55% Personally reviewed - Continue low-dose losartan, spiro and digoxin and po lasix - Cath today - LV looks thick. Will send myeloma panel. Consider PYP scan.   4. NSTEMI - Troponin elevated in 2-3 range. Now coming down. No CP ECG with new iRBBB and mild lateral TW changes - suspect demand ischemia in setting of severe medical illness.  - however echo from 6/14 suggestive of Tako-Tsubo EF 20% - continue heparin, ASA, statin - cath in am   5. Hypokalemia/hypomag - replete.  6. Chronic GI issues and malnutrition - now with aspiration PNA. Improved  7. Acute ICU delirium - Resolved    Length of Stay: 8   Glori Bickers  MD 02/10/2019, 8:21 AM  Advanced Heart Failure Team Pager [phone] (M-F; 7a - 4p)  Please contact [facility] for night-coverage after  hours (4p -7a ) and weekends on [email or web address]

## 2019-02-10 NOTE — Progress Notes (Signed)
  Echocardiogram 2D Echocardiogram Limited has been performed.  Darlina Sicilian M 02/10/2019, 8:02 AM

## 2019-02-10 NOTE — Progress Notes (Signed)
OT Cancellation Note  Patient Details Name: [name] MRN: [id number] DOB: Mar 19, 1946   Cancelled Treatment:    Reason Eval/Treat Not Completed: Patient at procedure or test/ unavailable. Pt at OR, OT will try again at next appropriate/available time  Britt Bottom 02/10/2019, 11:06 AM

## 2019-02-10 NOTE — Progress Notes (Signed)
Physical Therapy Treatment Patient Details Name: [name] MRN: [id number] DOB: 1946/05/24 Today's Date: 02/10/2019    History of Present Illness Pt is a 73 year old woman admitted 02/01/19 with acute on chronic respiratory failure, profound hypokalemia and hypomagnsemia. Intubated 6/13-6/16. Thoracentesis 6/15. PMH: COPD, afib wit RR, CHF, chronic GI issues and malnutrition, CKD.     PT Comments    Although tired, pt agreeable to activity OOB.  Emphasis on transition to EOB, sit to stand from bed and toilet, ambulation in the halls, maneuvering the RW in tight spaces.   Follow Up Recommendations  CIR;Supervision/Assistance - 24 hour     Equipment Recommendations  None recommended by PT    Recommendations for Other Services       Precautions / Restrictions Precautions Precautions: Fall    Mobility  Bed Mobility Overal bed mobility: Needs Assistance Bed Mobility: Supine to Sit     Supine to sit: Min assist     General bed mobility comments: minor stability assist  Transfers Overall transfer level: Needs assistance Equipment used: Rolling walker (2 wheeled) Transfers: Sit to/from Stand Sit to Stand: Min guard         General transfer comment: cues for hand placement.  min guard from bed and toilet.  Ambulation/Gait Ambulation/Gait assistance: Min guard Gait Distance (Feet): 75 Feet(x2) Assistive device: 4-wheeled walker Gait Pattern/deviations: Step-through pattern Gait velocity: slower   General Gait Details: Used rollator generally well, but her valgus R knee throws off her gait adding a little instability   Stairs             Wheelchair Mobility    Modified Rankin (Stroke Patients Only)       Balance Overall balance assessment: Needs assistance Sitting-balance support: Feet supported;No upper extremity supported Sitting balance-Leahy Scale: Fair       Standing balance-Leahy Scale: Poor Standing balance comment: min guard with the  rollator                            Cognition Arousal/Alertness: Awake/alert Behavior During Therapy: WFL for tasks assessed/performed Overall Cognitive Status: Within Functional Limits for tasks assessed                                        Exercises      General Comments General comments (skin integrity, edema, etc.): vss      Pertinent Vitals/Pain Pain Assessment: Faces Faces Pain Scale: No hurt    Home Living                      Prior Function            PT Goals (current goals can now be found in the care plan section) Acute Rehab PT Goals Patient Stated Goal: get stronger PT Goal Formulation: With patient Time For Goal Achievement: 02/19/19 Potential to Achieve Goals: Good Progress towards PT goals: Progressing toward goals    Frequency    Min 3X/week      PT Plan Current plan remains appropriate    Co-evaluation              AM-PAC PT "6 Clicks" Mobility   Outcome Measure  Help needed turning from your back to your side while in a flat bed without using bedrails?: A Little Help needed moving from lying on your  back to sitting on the side of a flat bed without using bedrails?: A Little Help needed moving to and from a bed to a chair (including a wheelchair)?: A Little Help needed standing up from a chair using your arms (e.g., wheelchair or bedside chair)?: A Little Help needed to walk in hospital room?: A Little Help needed climbing 3-5 steps with a railing? : A Lot 6 Click Score: 17    End of Session   Activity Tolerance: Patient tolerated treatment well Patient left: in bed;with call bell/phone within reach Nurse Communication: Mobility status PT Visit Diagnosis: Unsteadiness on feet (R26.81);Muscle weakness (generalized) (M62.81)     Time: 5809-9833 PT Time Calculation (min) (ACUTE ONLY): 38 min  Charges:  $Gait Training: 8-22 mins $Therapeutic Activity: 23-37 mins                      02/10/2019  Donnella Sham, PT [place] [phone]  (pager) [phone]  (office)   [name] 02/10/2019, 4:41 PM

## 2019-02-10 NOTE — Interval H&P Note (Signed)
History and Physical Interval Note:  02/10/2019 9:40 AM  [name]  has presented today for surgery, with the diagnosis of NSTEMI.  The various methods of treatment have been discussed with the patient and family. After consideration of risks, benefits and other options for treatment, the patient has consented to  Procedure(s): RIGHT/LEFT HEART CATH AND CORONARY ANGIOGRAPHY (N/A) and possible coronary angioplasty as a surgical intervention.  The patient's history has been reviewed, patient examined, no change in status, stable for surgery.  I have reviewed the patient's chart and labs.  Questions were answered to the patient's satisfaction.     [name]

## 2019-02-10 NOTE — Progress Notes (Signed)
PROGRESS NOTE  THALIA TURKINGTON IRC:[id number] DOB: Oct 04, 1945 DOA: 02/01/2019 PCP: Prince Solian, MD   LOS: 8 days   Patient is from: Home.  Uses walker for ambulation at baseline.  Brief Narrative / Interim history: 73 year old with history of essential hypertension, hyperlipidemia, paroxysmal A. fib on Eliquis, restless leg syndrome, hypothyroidism, CVA, depression, COPD initially presented with weakness and right knee pain.  Found to have significant electrolyte abnormality and diarrhea developed significant hypoxia.  Diagnosed with staph aureus pneumonia and right-sided transudative pleural effusion requiring thoracentesis 6/16.  Intubated from 6/13-6/16, central line placed 6/13.  Initially on vancomycin and cefepime later switched to Rocephin.  Also diagnosed with systolic CHF with ejection fraction 20%/Takotsubo pattern, heart failure team consulted. Limited echo on 6/22 with EF of 55%.  R/LHC with normal coronary arteries, LV function and low filling pressures.   Assessment & Plan: Acute on chronic hypoxemic respiratory failure-multifactorial including aspiration/staph aureus pneumonia, right-sided pleural effusion and acute systolic CHF.  Patient is currently on room air. -ETT 6/13-6/16 in the setting of encephalopathy.  -Treat treatable causes as below.  Acute on chronic systolic CHF: Initially concerned about Takotsubo CM.  Echo on 6/14 with EF of 20%.  Repeat limited echo on 6/22 with EF of 55%.  R/LHC on 6/22 not impressive.  Patient appears euvolemic.  Respiratory failure resolved. -Cardiology managing-on  -On bisoprolol, losartan, Aldactone, heparin and statin -Daily weight, intake output and renal function -Closely monitor electrolytes and replenish aggressively.  Right-sided pleural effusion: thoracocentesis on 6/16 with removal of 750 cc culture negative transudative fluid.  Suspect this to be due to CHF. -Manage CHF as above  Aspiration pneumonia: Concern about this  based on chest x-ray which showed right lung opacity which could also be due to pleural effusion.  Respiratory culture with moderate staph aureus.  Blood cultures negative.  Procalcitonin negative.  -We will discontinue antibiotics. -Ceftriaxone 6/13-6/15, 6/17- 6/18 -Cefepime 6/15-6/16 -Cefdinir 6/18-6/22  Chronic atrial fibrillation with intermittent RVR: Stable.  On Cardizem and Eliquis at home -Continue bisoprolol and heparin drip -Will resume home Eliquis in the morning  History of right nephrectomy -Avoid nephrotoxic meds  Moderate protein calorie malnutrition: Likely due to acute illness.  BMI 18.8 -Appreciate dietitian input-dysphagia diet, supplements  Acute metabolic encephalopathy: Likely due to acute illness and possible delirium.  Resolved.  Anxiety/depression: Stable -Continue home Wellbutrin and Prozac although this is not a good choice for anxiety.  Hypothyroidism: Stable. -Continue home Synthroid  Generalized weakness/debility -PT/OT-CIR.   Scheduled Meds: . atorvastatin  10 mg Oral Daily  . bisoprolol  2.5 mg Oral Daily  . buPROPion  150 mg Oral Daily  . cefdinir  300 mg Oral Q12H  . Chlorhexidine Gluconate Cloth  6 each Topical Daily  . cloNIDine  0.1 mg Transdermal Weekly  . digoxin  0.125 mg Oral Daily  . feeding supplement (ENSURE ENLIVE)  237 mL Oral TID BM  . fentaNYL  1 patch Transdermal Q72H  . furosemide  40 mg Oral Daily  . insulin aspart  0-15 Units Subcutaneous Q4H  . levothyroxine  88 mcg Oral Daily  . losartan  25 mg Oral Daily  . mouth rinse  15 mL Mouth Rinse BID  . sodium chloride flush  3 mL Intravenous Q12H  . spironolactone  12.5 mg Oral Daily   Continuous Infusions: . sodium chloride    . sodium chloride    . sodium chloride 10 mL/hr at 02/10/19 0158  . heparin 1,150 Units/hr (02/10/19 0822)  PRN Meds:.sodium chloride, sodium chloride, acetaminophen **OR** acetaminophen, albuterol, fluticasone, hydrALAZINE, methocarbamol,  metoprolol tartrate, naLOXone (NARCAN)  injection, ondansetron **OR** ondansetron (ZOFRAN) IV, polyethylene glycol, rOPINIRole, senna-docusate, sodium chloride flush   DVT prophylaxis: On heparin drip Code Status: Full code Family Communication: Patient to let me know if there is any question Disposition Plan: Remains inpatient pending clearance by cardiology for CIR placement  Subjective: No major events overnight of this morning.  States that she has good night.  She had R/LHC this morning which was not impressive.  Denies chest pain, dyspnea, palpitation, GI or GU symptoms.  Likes to work with PT   Objective: Vitals:   02/09/19 0902 02/09/19 2019 02/10/19 0000 02/10/19 0400  BP:  (!) 126/115 (!) 132/117 (!) 140/97  Pulse: 88 99    Resp:  17 14 14   Temp:  98.1 F (36.7 C)    TempSrc:  Oral    SpO2:  98%    Weight:      Height:        Intake/Output Summary (Last 24 hours) at 02/10/2019 0848 Last data filed at 02/09/2019 1100 Gross per 24 hour  Intake 303.82 ml  Output -  Net 303.82 ml   Filed Weights   02/07/19 0500 02/08/19 0452 02/09/19 0304  Weight: 47.6 kg 53.5 kg 51.4 kg    Examination:  GENERAL: No acute distress.  Appears well.  HEENT: MMM.  Vision and hearing grossly intact.  NECK: Supple.  No apparent JVD. LUNGS:  No IWOB. Good air movement bilaterally. HEART:  RRR. Heart sounds normal.  ABD: Bowel sounds present. Soft. Non tender.  MSK/EXT:  Moves all extremities. No apparent deformity. No edema bilaterally.  SKIN: no apparent skin lesion or wound NEURO: Awake, alert and oriented appropriately.  No gross deficit.  PSYCH: Calm. Normal affect.   Consultants:   Cardiology  Procedures:   ETT 6/13-6/16  Thoracocentesis 6/16  R/LHC on 6/22  Respiratory culture staph aureus  Pleural fluid culture negative  Microbiology: . COVID-19 negative . Blood cultures negative . RVP negative  Antimicrobials: Anti-infectives (From admission, onward)    Start     Dose/Rate Route Frequency Ordered Stop   02/06/19 1045  cefdinir (OMNICEF) capsule 300 mg     300 mg Oral Every 12 hours 02/06/19 1012 02/16/19 0959   02/05/19 0900  cefTRIAXone (ROCEPHIN) 2 g in sodium chloride 0.9 % 100 mL IVPB  Status:  Discontinued     2 g 200 mL/hr over 30 Minutes Intravenous Every 24 hours 02/05/19 0856 02/06/19 1013   02/04/19 1000  vancomycin (VANCOCIN) 1,250 mg in sodium chloride 0.9 % 250 mL IVPB  Status:  Discontinued     1,250 mg 166.7 mL/hr over 90 Minutes Intravenous Every 36 hours 02/04/19 0930 02/05/19 0856   02/03/19 1000  ceFEPIme (MAXIPIME) 2 g in sodium chloride 0.9 % 100 mL IVPB  Status:  Discontinued     2 g 200 mL/hr over 30 Minutes Intravenous Every 12 hours 02/03/19 0939 02/04/19 1017   02/02/19 1000  cefTRIAXone (ROCEPHIN) 2 g in sodium chloride 0.9 % 100 mL IVPB  Status:  Discontinued     2 g 200 mL/hr over 30 Minutes Intravenous Every 24 hours 02/02/19 0958 02/03/19 0939   02/01/19 1800  cefTRIAXone (ROCEPHIN) 1 g in sodium chloride 0.9 % 100 mL IVPB  Status:  Discontinued     1 g 200 mL/hr over 30 Minutes Intravenous Every 24 hours 02/01/19 1647 02/02/19 0263  Data Reviewed: I have independently reviewed following labs and imaging studies   CBC: Recent Labs  Lab 02/04/19 0426 02/05/19 0423 02/06/19 0400 02/07/19 0632 02/08/19 0242 02/09/19 0444 02/10/19 0421  WBC 11.6* 7.2 7.4 9.3 9.0 9.8 9.0  NEUTROABS 8.0* 5.1  --   --   --   --   --   HGB 11.6* 10.8* 9.8* 12.8 12.3 13.2 14.1  HCT 36.2 33.4* 30.2* 38.6 37.4 39.4 42.4  MCV 100.0 98.8 98.4 96.3 97.1 95.9 96.8  PLT 224 152 164 250 240 265 782   Basic Metabolic Panel: Recent Labs  Lab 02/04/19 0426 02/05/19 0423  02/07/19 0053 02/07/19 9562 02/08/19 0242 02/09/19 0444 02/10/19 0421  NA  --   --    < > 141 142 140 139 137  K  --   --    < > 3.4* 3.6 3.7 3.5 4.3  CL  --   --    < > 102 100 102 101 102  CO2  --   --    < > 26 29 27 27 25   GLUCOSE  --   --     < > 112* 110* 101* 98 95  BUN  --   --    < > 14 12 12 12 10   CREATININE  --   --    < > 0.91 0.79 0.85 0.83 0.92  CALCIUM  --   --    < > 9.4 9.5 9.3 9.1 9.4  MG 1.9 1.8   < > 2.1 2.0 1.9 1.6* 2.3  PHOS 1.6* 3.7  --   --   --   --   --   --    < > = values in this interval not displayed.   GFR: Estimated Creatinine Clearance: 44.2 mL/min (by C-G formula based on SCr of 0.92 mg/dL). Liver Function Tests: Recent Labs  Lab 02/04/19 0426 02/09/19 0444 02/10/19 0421  AST 123* 22 21  ALT 231* 81* 65*  ALKPHOS 59 50 53  BILITOT 0.6 1.1 0.9  PROT 4.9* 5.4* 5.4*  ALBUMIN 2.4* 3.0* 3.2*   Recent Labs  Lab 02/04/19 0426  LIPASE 23  AMYLASE 13*   No results for input(s): AMMONIA in the last 168 hours. Coagulation Profile: Recent Labs  Lab 02/04/19 0426  INR 1.3*   Cardiac Enzymes: No results for input(s): CKTOTAL, CKMB, CKMBINDEX, TROPONINI in the last 168 hours. BNP (last 3 results) No results for input(s): PROBNP in the last 8760 hours. HbA1C: No results for input(s): HGBA1C in the last 72 hours. CBG: Recent Labs  Lab 02/09/19 1724 02/09/19 2035 02/09/19 2358 02/10/19 0357 02/10/19 0805  GLUCAP 78 101* 127* 94 97   Lipid Profile: No results for input(s): CHOL, HDL, LDLCALC, TRIG, CHOLHDL, LDLDIRECT in the last 72 hours. Thyroid Function Tests: No results for input(s): TSH, T4TOTAL, FREET4, T3FREE, THYROIDAB in the last 72 hours. Anemia Panel: No results for input(s): VITAMINB12, FOLATE, FERRITIN, TIBC, IRON, RETICCTPCT in the last 72 hours. Urine analysis:    Component Value Date/Time   COLORURINE YELLOW 02/01/2019 1003   APPEARANCEUR CLEAR 02/01/2019 1003   LABSPEC 1.012 02/01/2019 1003   PHURINE 7.0 02/01/2019 1003   GLUCOSEU 50 (A) 02/01/2019 1003   HGBUR SMALL (A) 02/01/2019 1003   BILIRUBINUR NEGATIVE 02/01/2019 1003   KETONESUR 5 (A) 02/01/2019 1003   PROTEINUR NEGATIVE 02/01/2019 1003   NITRITE NEGATIVE 02/01/2019 1003   LEUKOCYTESUR NEGATIVE  02/01/2019 1003   Sepsis Labs: Invalid  input(s): PROCALCITONIN, LACTICIDVEN  Recent Results (from the past 240 hour(s))  Novel Coronavirus, NAA (hospital order; send-out to ref lab)     Status: None   Collection Time: 02/01/19  3:25 PM   Specimen: Nasopharyngeal Swab; Respiratory  Result Value Ref Range Status   SARS-CoV-2, NAA NOT DETECTED NOT DETECTED Final    Comment: (NOTE) This test was developed and its performance characteristics determined by [employer]. This test has not been FDA cleared or approved. This test has been authorized by FDA under an Emergency Use Authorization (EUA). This test is only authorized for the duration of time the declaration that circumstances exist justifying the authorization of the emergency use of in vitro diagnostic tests for detection of SARS-CoV-2 virus and/or diagnosis of COVID-19 infection under section 564(b)(1) of the Act, 21 U.S.C. 093GHW-2(X)(9), unless the authorization is terminated or revoked sooner. When diagnostic testing is negative, the possibility of a false negative result should be considered in the context of a patient's recent exposures and the presence of clinical signs and symptoms consistent with COVID-19. An individual without symptoms of COVID-19 and who is not shedding SARS-CoV-2 virus would expect to have a negative (not detected) result in this assay. Performed  At: [facility] [address] [place], [place] [id number] Rush Farmer MD FY:[id number]    [place]  Final    Comment: Performed at [facility], [place] [address] [place], [place] [address]  SARS Coronavirus 2     Status: None   Collection Time: 02/01/19  5:05 PM  Result Value Ref Range Status   SARS Coronavirus 2 NOT DETECTED NOT DETECTED Final    Comment: (NOTE) SARS-CoV-2 target nucleic acids are NOT DETECTED. The SARS-CoV-2 RNA is generally detectable in upper and lower respiratory specimens during  the acute phase of infection.  Negative  results do not preclude SARS-CoV-2 infection, do not rule out co-infections with other pathogens, and should not be used as the sole basis for treatment or other patient management decisions.  Negative results must be combined with clinical observations, patient history, and epidemiological information. The expected result is Not Detected. Fact Sheet for Patients: http://www.biofiredefense.com/wp-content/uploads/2020/03/BIOFIRE-COVID -19-patients.pdf Fact Sheet for Healthcare Providers: http://www.biofiredefense.com/wp-content/uploads/2020/03/BIOFIRE-COVID -19-hcp.pdf This test is not yet approved or cleared by the [place] and  has been authorized for detection and/or diagnosis of SARS-CoV-2 by FDA under an Emergency Use Authorization (EUA).  This EUA will remain in effec t (meaning this test can be used) for the duration of  the COVID-19 declaration under Section 564(b)(1) of the Act, 21 U.S.C. section 360bbb-3(b)(1), unless the authorization is terminated or revoked sooner. Performed at [facility], [place] [address], [place], [place] [address]   MRSA PCR Screening     Status: None   Collection Time: 02/01/19  8:46 PM   Specimen: Nasopharyngeal  Result Value Ref Range Status   MRSA by PCR NEGATIVE NEGATIVE Final    Comment:        The GeneXpert MRSA Assay (FDA approved for NASAL specimens only), is one component of a comprehensive MRSA colonization surveillance program. It is not intended to diagnose MRSA infection nor to guide or monitor treatment for MRSA infections. Performed at [facility], [place] [address] [place], [place] [address]   Culture, blood (routine x 2)     Status: None   Collection Time: 02/01/19  9:28 PM   Specimen: BLOOD  Result Value Ref Range Status   Specimen Description BLOOD RIGHT  ANTECUBITAL  Final   Special Requests   Final    BOTTLES DRAWN AEROBIC ONLY Blood Culture adequate volume    Culture   Final    NO GROWTH 5 DAYS Performed at [facility], [address] [place], [place] [address]    Report Status 02/06/2019 FINAL  Final  Culture, blood (routine x 2)     Status: None   Collection Time: 02/01/19  9:28 PM   Specimen: BLOOD  Result Value Ref Range Status   Specimen Description BLOOD RIGHT ANTECUBITAL  Final   Special Requests   Final    BOTTLES DRAWN AEROBIC ONLY Blood Culture adequate volume   Culture   Final    NO GROWTH 5 DAYS Performed at [facility], [place] [address], [place], [place] [address]    Report Status 02/06/2019 FINAL  Final  Culture, respiratory (non-expectorated)     Status: None   Collection Time: 02/02/19  4:40 PM   Specimen: Tracheal Aspirate; Respiratory  Result Value Ref Range Status   Specimen Description TRACHEAL ASPIRATE  Final   Special Requests Normal  Final   Gram Stain   Final    RARE WBC PRESENT,BOTH PMN AND MONONUCLEAR RARE GRAM POSITIVE COCCI IN PAIRS Performed at [facility], [place] [address], [place], [place] [address]    Culture MODERATE STAPHYLOCOCCUS AUREUS  Final   Report Status 02/05/2019 FINAL  Final   Organism ID, Bacteria STAPHYLOCOCCUS AUREUS  Final      Susceptibility   Staphylococcus aureus - MIC*    CIPROFLOXACIN <=0.5 SENSITIVE Sensitive     ERYTHROMYCIN <=0.25 SENSITIVE Sensitive     GENTAMICIN <=0.5 SENSITIVE Sensitive     OXACILLIN <=0.25 SENSITIVE Sensitive     TETRACYCLINE <=1 SENSITIVE Sensitive     VANCOMYCIN 1 SENSITIVE Sensitive     TRIMETH/SULFA <=10 SENSITIVE Sensitive     CLINDAMYCIN <=0.25 SENSITIVE Sensitive     RIFAMPIN <=0.5 SENSITIVE Sensitive     Inducible Clindamycin NEGATIVE Sensitive     * MODERATE STAPHYLOCOCCUS AUREUS  Respiratory Panel by PCR     Status: None   Collection Time: 02/03/19  9:33 AM   Specimen: Nasopharyngeal Swab; Respiratory  Result Value Ref Range Status   Adenovirus NOT DETECTED NOT DETECTED Final   Coronavirus 229E NOT DETECTED NOT  DETECTED Final    Comment: (NOTE) The Coronavirus on the Respiratory Panel, DOES NOT test for the novel  Coronavirus (2019 nCoV)    Coronavirus HKU1 NOT DETECTED NOT DETECTED Final   Coronavirus NL63 NOT DETECTED NOT DETECTED Final   Coronavirus OC43 NOT DETECTED NOT DETECTED Final   Metapneumovirus NOT DETECTED NOT DETECTED Final   Rhinovirus / Enterovirus NOT DETECTED NOT DETECTED Final   Influenza A NOT DETECTED NOT DETECTED Final   Influenza B NOT DETECTED NOT DETECTED Final   Parainfluenza Virus 1 NOT DETECTED NOT DETECTED Final   Parainfluenza Virus 2 NOT DETECTED NOT DETECTED Final   Parainfluenza Virus 3 NOT DETECTED NOT DETECTED Final   Parainfluenza Virus 4 NOT DETECTED NOT DETECTED Final   Respiratory Syncytial Virus NOT DETECTED NOT DETECTED Final   Bordetella pertussis NOT DETECTED NOT DETECTED Final   Chlamydophila pneumoniae NOT DETECTED NOT DETECTED Final   Mycoplasma pneumoniae NOT DETECTED NOT DETECTED Final    Comment: Performed at [facility], [place]. [address], [place], [place] [address]  Body fluid culture (includes gram stain)     Status: None   Collection Time: 02/04/19  1:10 PM   Specimen: Pleural Fluid  Result Value Ref Range Status   Specimen Description PLEURAL  Final   Special Requests FLUID  Final   Gram Stain   Final    ABUNDANT WBC PRESENT,BOTH PMN AND MONONUCLEAR NO ORGANISMS SEEN    Culture   Final    NO GROWTH Performed at [facility], [address], [place], [place] [address]    Report Status 02/07/2019 FINAL  Final     Radiology Studies: No results found.  Bertis Hustead T. Yoder  If 7PM-7AM, please contact night-coverage [email or web address] Password [facility] 02/10/2019, 8:48 AM

## 2019-02-10 NOTE — Progress Notes (Signed)
Right radial TR-band removed without difficulty.  Site is clean, dry and intact.  Covered with gauze and Tegaderm.  Will continue to monitor.

## 2019-02-10 NOTE — Progress Notes (Signed)
[place] for Heparin Indication: atrial fibrillation  Allergies  Allergen Reactions  . Penicillins     Causes rash Has patient had a PCN reaction causing immediate rash, facial/tongue/throat swelling, SOB or lightheadedness with hypotension: YES Has patient had a PCN reaction causing severe rash involving mucus membranes or skin necrosis: No Has patient had a PCN reaction that required hospitalization No Has patient had a PCN reaction occurring within the last 10 years: No If all of the above answers are "NO", then may proceed with Cephalosporin use.     Patient Measurements: Height: 5\' 5"  (165.1 cm) Weight: 113 lb 6.4 oz (51.4 kg) IBW/kg (Calculated) : 57 Heparin Dosing Weight: 53.2 kg  Vital Signs: Temp: 98.1 F (36.7 C) (06/21 2019) Temp Source: Oral (06/21 2019) BP: 140/97 (06/22 0400) Pulse Rate: 99 (06/21 2019)  Labs: Recent Labs    02/08/19 0242 02/09/19 0444 02/10/19 0421  HGB 12.3 13.2 14.1  HCT 37.4 39.4 42.4  PLT 240 265 274  HEPARINUNFRC 0.47 0.43 0.74*  CREATININE 0.85 0.83 0.92    Estimated Creatinine Clearance: 44.2 mL/min (by C-G formula based on SCr of 0.92 mg/dL).   Assessment: 73 y.o. female with h/o Afib, PTA apixaban on hold, for heparin. Pt seems to be sensitive to rate changes.  Heparin level slightly above goal today, CBC stable and no S/Sx bleeding per RN. Cath lab planned for today, can likely transition back to apixaban after.  Goal of Therapy:  Heparin level 0.3-0.7 Monitor platelets by anticoagulation protocol: Yes   Plan:  Reduce heparin to 1150 units/hr Follow-up after cath   Arrie Senate, PharmD, BCPS Clinical Pharmacist [phone] Please check AMION for all [facility] numbers 02/10/2019

## 2019-02-11 ENCOUNTER — Inpatient Hospital Stay (HOSPITAL_COMMUNITY): Payer: Medicare Other

## 2019-02-11 LAB — CBC
HCT: 43.8 % (ref 36.0–46.0)
Hemoglobin: 14.3 g/dL (ref 12.0–15.0)
MCH: 31 pg (ref 26.0–34.0)
MCHC: 32.6 g/dL (ref 30.0–36.0)
MCV: 95 fL (ref 80.0–100.0)
Platelets: 318 10*3/uL (ref 150–400)
RBC: 4.61 MIL/uL (ref 3.87–5.11)
RDW: 13.1 % (ref 11.5–15.5)
WBC: 11 10*3/uL — ABNORMAL HIGH (ref 4.0–10.5)
nRBC: 0 % (ref 0.0–0.2)

## 2019-02-11 LAB — COMPREHENSIVE METABOLIC PANEL
ALT: 52 U/L — ABNORMAL HIGH (ref 0–44)
AST: 20 U/L (ref 15–41)
Albumin: 3.3 g/dL — ABNORMAL LOW (ref 3.5–5.0)
Alkaline Phosphatase: 53 U/L (ref 38–126)
Anion gap: 12 (ref 5–15)
BUN: 11 mg/dL (ref 8–23)
CO2: 24 mmol/L (ref 22–32)
Calcium: 9.9 mg/dL (ref 8.9–10.3)
Chloride: 98 mmol/L (ref 98–111)
Creatinine, Ser: 0.93 mg/dL (ref 0.44–1.00)
GFR calc Af Amer: >60 mL/min (ref >60)
GFR calc non Af Amer: >60 mL/min (ref >60)
Glucose, Bld: 104 mg/dL — ABNORMAL HIGH (ref 70–99)
Potassium: 4.2 mmol/L (ref 3.5–5.1)
Sodium: 134 mmol/L — ABNORMAL LOW (ref 135–145)
Total Bilirubin: 1.5 mg/dL — ABNORMAL HIGH (ref 0.3–1.2)
Total Protein: 5.9 g/dL — ABNORMAL LOW (ref 6.5–8.1)

## 2019-02-11 LAB — GLUCOSE, CAPILLARY
Glucose-Capillary: 101 mg/dL — ABNORMAL HIGH (ref 70–99)
Glucose-Capillary: 101 mg/dL — ABNORMAL HIGH (ref 70–99)
Glucose-Capillary: 109 mg/dL — ABNORMAL HIGH (ref 70–99)
Glucose-Capillary: 111 mg/dL — ABNORMAL HIGH (ref 70–99)
Glucose-Capillary: 133 mg/dL — ABNORMAL HIGH (ref 70–99)
Glucose-Capillary: 106 mg/dL — ABNORMAL HIGH (ref 70–99)

## 2019-02-11 LAB — HEPARIN LEVEL (UNFRACTIONATED): Heparin Unfractionated: 0.38 IU/mL (ref 0.30–0.70)

## 2019-02-11 LAB — LIPASE, BLOOD: Lipase: 33 U/L (ref 11–51)

## 2019-02-11 LAB — MAGNESIUM: Magnesium: 1.9 mg/dL (ref 1.7–2.4)

## 2019-02-11 MED ORDER — HEPARIN (PORCINE) 25000 UT/250ML-% IV SOLN
1150.0000 [IU]/h | INTRAVENOUS | Status: DC
  Administered 2019-02-11: 1150 [IU]/h via INTRAVENOUS
  Filled 2019-02-11: qty 250

## 2019-02-11 MED ORDER — IOHEXOL 300 MG/ML  SOLN
100.0000 mL | Freq: Once | INTRAMUSCULAR | Status: AC | PRN
  Administered 2019-02-11: 100 mL via INTRAVENOUS

## 2019-02-11 MED ORDER — PANTOPRAZOLE SODIUM 40 MG PO TBEC
40.0000 mg | DELAYED_RELEASE_TABLET | Freq: Two times a day (BID) | ORAL | Status: DC
  Administered 2019-02-11 – 2019-02-13 (×4): 40 mg via ORAL
  Filled 2019-02-11 (×4): qty 1

## 2019-02-11 NOTE — Progress Notes (Signed)
[place] for Heparin Indication: atrial fibrillation  Allergies  Allergen Reactions  . Penicillins     Causes rash Has patient had a PCN reaction causing immediate rash, facial/tongue/throat swelling, SOB or lightheadedness with hypotension: YES Has patient had a PCN reaction causing severe rash involving mucus membranes or skin necrosis: No Has patient had a PCN reaction that required hospitalization No Has patient had a PCN reaction occurring within the last 10 years: No If all of the above answers are "NO", then may proceed with Cephalosporin use.     Patient Measurements: Height: 5\' 5"  (165.1 cm) Weight: 109 lb 4.8 oz (49.6 kg) IBW/kg (Calculated) : 57 Heparin Dosing Weight: 53.2 kg  Vital Signs: Temp: 99.4 F (37.4 C) (06/23 1157) Temp Source: Oral (06/23 1157) BP: 135/94 (06/23 1157) Pulse Rate: 93 (06/23 1157)  Labs: Recent Labs    02/09/19 0444 02/10/19 0421 02/10/19 0954 02/10/19 1030 02/11/19 0405  HGB 13.2 14.1 13.3 13.9  13.3 14.3  HCT 39.4 42.4 39.0 41.0  39.0 43.8  PLT 265 274  --   --  318  HEPARINUNFRC 0.43 0.74*  --   --   --   CREATININE 0.83 0.92  --   --  0.93    Estimated Creatinine Clearance: 42.2 mL/min (by C-G formula based on SCr of 0.93 mg/dL).   Assessment: 73 y.o. female with h/o Afib, PTA apixaban on hold, for heparin. Pt seems to be sensitive to rate changes.  Heparin previously running at 1150 units/hr. Patient underwent cath yesterday. Restarting heparin pending GI workup. Pharmacy consulted to restart heparin. CBC stable.  Goal of Therapy:  Heparin level 0.3-0.7 Monitor platelets by anticoagulation protocol: Yes   Plan:  Restart heparin at 1150 units/hr Heparin level in 6 hours   Claiborne Billings, PharmD PGY2 Cardiology Pharmacy Resident Please check AMION for all Pharmacist numbers by unit 02/11/2019 2:06 PM

## 2019-02-11 NOTE — Progress Notes (Signed)
Occupational Therapy Treatment Patient Details Name: [name] MRN: [id number] DOB: Jan 24, 1946 Today's Date: 02/11/2019    History of present illness Pt is a 73 year old woman admitted 02/01/19 with acute on chronic respiratory failure, profound hypokalemia and hypomagnsemia. Intubated 6/13-6/16. Thoracentesis 6/15. PMH: COPD, afib wit RR, CHF, chronic GI issues and malnutrition, CKD.    OT comments  Pt not feeling well today but agreeable to bed level exercise. Pt issued level 1  theraband and squeeze ball in addition to written HEP to complete.  Pt eager to participate with therapy as much as shecan tolerate. Continue to recommend CIR for rehab at this time.   Follow Up Recommendations  CIR    Equipment Recommendations  None recommended by OT    Recommendations for Other Services      Precautions / Restrictions Precautions Precautions: Fall       Mobility Bed Mobility                  Transfers                      Balance                                           ADL either performed or assessed with clinical judgement   ADL Overall ADL's : Needs assistance/impaired                                       General ADL Comments: unable to complete ADL today as shw was not feeling well     Vision       Perception     Praxis      Cognition Arousal/Alertness: Awake/alert Behavior During Therapy: Flat affect Overall Cognitive Status: Within Functional Limits for tasks assessed                                          Exercises Exercises: General Upper Extremity General Exercises - Upper Extremity Shoulder Flexion: Strengthening;AROM;Theraband;5 reps;Supine Shoulder ABduction: Strengthening;Both;5 reps;Supine Elbow Flexion: Strengthening;Both;5 reps;Supine Elbow Extension: Strengthening;Both;5 reps;Supine Digit Composite Flexion: Squeeze ball;10 reps;Both   Shoulder Instructions       General Comments      Pertinent Vitals/ Pain       Pain Assessment: Faces Faces Pain Scale: Hurts even more Pain Location: abdomen Pain Descriptors / Indicators: Discomfort;Grimacing Pain Intervention(s): Limited activity within patient's tolerance  Home Living                                          Prior Functioning/Environment              Frequency  Min 2X/week        Progress Toward Goals  OT Goals(current goals can now be found in the care plan section)  Progress towards OT goals: Not progressing toward goals - comment(not feeling well)  Acute Rehab OT Goals Patient Stated Goal: get stronger OT Goal Formulation: With patient Time For Goal Achievement: 02/19/19 Potential to Achieve Goals: Good ADL Goals Pt Will Perform Grooming: with min  assist;standing Pt Will Perform Upper Body Dressing: with set-up;with supervision;sitting Pt Will Perform Lower Body Dressing: with min assist;sit to/from stand Pt Will Transfer to Toilet: with min assist;ambulating;bedside commode Pt Will Perform Toileting - Clothing Manipulation and hygiene: with min assist;sit to/from stand Pt/caregiver will Perform Home Exercise Program: Increased strength;Both right and left upper extremity;With theraband;With written HEP provided;Independently  Plan Discharge plan remains appropriate    Co-evaluation                 AM-PAC OT "6 Clicks" Daily Activity     Outcome Measure   Help from another person eating meals?: A Little Help from another person taking care of personal grooming?: A Little Help from another person toileting, which includes using toliet, bedpan, or urinal?: Total Help from another person bathing (including washing, rinsing, drying)?: A Lot Help from another person to put on and taking off regular upper body clothing?: A Lot Help from another person to put on and taking off regular lower body clothing?: Total 6 Click Score: 12    End of  Session    OT Visit Diagnosis: Unsteadiness on feet (R26.81);Other abnormalities of gait and mobility (R26.89);Muscle weakness (generalized) (M62.81);Other symptoms and signs involving cognitive function   Activity Tolerance Patient limited by fatigue;Patient limited by pain   Patient Left in bed;with call bell/phone within reach   Nurse Communication Mobility status;Other (comment)(encourage exercise)        Time: 4132-4401 OT Time Calculation (min): 13 min  Charges: OT General Charges $OT Visit: 1 Visit OT Treatments $Therapeutic Exercise: 8-22 mins  Maurie Boettcher, OT/L   Acute OT Clinical Specialist [place] Pager [phone] Office [phone]    [facility] 02/11/2019, 3:53 PM
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
## 2019-02-11 NOTE — Progress Notes (Signed)
Called to update Dr. and Neysa Hotter about plan for pt to go for abdominal CT this afternoon per pt request. Will continue to monitor.

## 2019-02-11 NOTE — Progress Notes (Signed)
[facility] for Heparin Indication: atrial fibrillation  Allergies  Allergen Reactions  . Penicillins     Causes rash Has patient had a PCN reaction causing immediate rash, facial/tongue/throat swelling, SOB or lightheadedness with hypotension: YES Has patient had a PCN reaction causing severe rash involving mucus membranes or skin necrosis: No Has patient had a PCN reaction that required hospitalization No Has patient had a PCN reaction occurring within the last 10 years: No If all of the above answers are "NO", then may proceed with Cephalosporin use.     Patient Measurements: Height: 5\' 5"  (165.1 cm) Weight: 109 lb 4.8 oz (49.6 kg) IBW/kg (Calculated) : 57 Heparin Dosing Weight: 53.2 kg  Vital Signs: Temp: 98 F (36.7 C) (06/23 1704) Temp Source: Oral (06/23 1704) BP: 130/86 (06/23 1704) Pulse Rate: 68 (06/23 1713)  Labs: Recent Labs    02/09/19 0444 02/10/19 0421 02/10/19 0954 02/10/19 1030 02/11/19 0405 02/11/19 2026  HGB 13.2 14.1 13.3 13.9  13.3 14.3  --   HCT 39.4 42.4 39.0 41.0  39.0 43.8  --   PLT 265 274  --   --  318  --   HEPARINUNFRC 0.43 0.74*  --   --   --  0.38  CREATININE 0.83 0.92  --   --  0.93  --     Estimated Creatinine Clearance: 42.2 mL/min (by C-G formula based on SCr of 0.93 mg/dL).   Assessment: 73 y.o. female with h/o Afib, PTA apixaban on hold, for heparin. Pt seems to be sensitive to rate changes.  Heparin level at goal this evening, no changes indicated at this time.   Goal of Therapy:  Heparin level 0.3-0.7 Monitor platelets by anticoagulation protocol: Yes   Plan:  Continue heparin at 1150 units/hr  Erin Hearing PharmD., BCPS Clinical Pharmacist 02/11/2019 10:00 PM

## 2019-02-11 NOTE — Progress Notes (Deleted)
Inpatient Rehabilitation Admissions Coordinator  Inpatient Rehab Consult received. I met with patient and his wife at the bedside for rehabilitation assessment. We discussed goals and expectations of an inpatient rehab admission.  Wife prefers an inpt rehab admit . I will contact workers compensation and discuss with Dr. Delice Lesch who he see regularly in the outpatient setting. I will follow up tomorrow.  Danne Baxter, RN, MSN Rehab Admissions Coordinator [phone] 02/11/2019 4:36 PM

## 2019-02-11 NOTE — Progress Notes (Signed)
Inpatient Rehabilitation Admissions Coordinator  Inpatient rehab consult received. I will follow up with patient and family tomorrow for rehab assessment.  Danne Baxter, RN, MSN Rehab Admissions Coordinator [phone] 02/11/2019 4:47 PM

## 2019-02-11 NOTE — Progress Notes (Signed)
PROGRESS NOTE  [name] DDU:[id number] DOB: [birth year] DOA: 02/01/2019 PCP: Prince Solian, MD   LOS: 9 days   Patient is from: Home.  Uses walker for ambulation at baseline.  Brief Narrative / Interim history: 73 year old with history of essential hypertension, hyperlipidemia, paroxysmal A. fib on Eliquis, restless leg syndrome, hypothyroidism, CVA, depression, COPD initially presented with weakness and right knee pain.  Found to have significant electrolyte abnormality and diarrhea developed significant hypoxia.  Diagnosed with staph aureus pneumonia and right-sided transudative pleural effusion requiring thoracentesis 6/16.  Intubated from 6/13-6/16, central line placed 6/13.  Initially on vancomycin and cefepime later switched to Rocephin.  Also diagnosed with systolic CHF with ejection fraction 20%/Takotsubo pattern, heart failure team consulted. Limited echo on 6/22 with EF of 55%.  R/LHC with normal coronary arteries, LV function and low filling pressures.   Assessment & Plan: Recurrent nausea/vomiting/diarrhea/abdominal pain: had about 6 episodes of emesis and diarrhea last night.  Diarrhea is watery. Per family, these seem to be a recurrent issue.  She was evaluated by GI, Dr. Hilarie Fredrickson in the past but had no explanation.  Had mild LFT which is downtrending.  Has had aspiration pneumonia requiring intubation this admission.  Abdominal exam is benign today.  She has no fever or significant leukocytosis. -We check lipase and CT abdomen -We will consult GI after CT abdomen.  Acute on chronic hypoxemic respiratory failure-multifactorial including aspiration/staph aureus pneumonia, right-sided pleural effusion and acute systolic CHF.  Patient is currently on room air. -ETT 6/13-6/16 in the setting of encephalopathy.  -Treat treatable causes as below.  Acute on chronic systolic CHF: Initially concerned about Takotsubo CM.  Echo on 6/14 with EF of 20%.  Repeat limited echo on 6/22 with EF  of 55%.  R/LHC on 6/22 not impressive.  Patient appears euvolemic.  Respiratory failure resolved.  Intake output was not captured well in epic. -Cardiology managing:  -On bisoprolol, losartan, Aldactone, heparin and statin -Daily weight, intake output and renal function -Closely monitor electrolytes and replenish aggressively.  Right-sided pleural effusion: thoracocentesis on 6/16 with removal of 750 cc culture negative transudative fluid.  Suspect this to be due to CHF. -Manage CHF as above  Aspiration pneumonia: Concern about this based on chest x-ray which showed right lung opacity which could also be due to pleural effusion.  Respiratory culture with moderate staph aureus.  Blood cultures negative.  Procalcitonin negative.  -Ceftriaxone 6/13-6/15, 6/17- 6/18 -Cefepime 6/15-6/16 -Cefdinir 6/18-6/22  Chronic atrial fibrillation with intermittent RVR: Stable.  On Cardizem and Eliquis at home -Continue bisoprolol and heparin drip -We will resume home Eliquis after CT abdomen.  History of right nephrectomy -Avoid nephrotoxic meds  Moderate protein calorie malnutrition: Likely due to acute illness.  BMI 18.8 -Appreciate dietitian input-dysphagia diet, supplements  Acute metabolic encephalopathy: Likely due to acute illness and possible delirium.  Resolved.  Anxiety/depression: Stable -Continue home Wellbutrin and Prozac although this is not a good choice for anxiety.  Hypothyroidism: Stable. -Continue home Synthroid  Generalized weakness/debility -PT/OT-CIR.   Scheduled Meds: . atorvastatin  10 mg Oral Daily  . bisoprolol  2.5 mg Oral Daily  . buPROPion  150 mg Oral Daily  . Chlorhexidine Gluconate Cloth  6 each Topical Daily  . feeding supplement (ENSURE ENLIVE)  237 mL Oral TID BM  . fentaNYL  1 patch Transdermal Q72H  . FLUoxetine  20 mg Oral Daily  . insulin aspart  0-15 Units Subcutaneous Q4H  . levothyroxine  88 mcg Oral Daily  .  losartan  25 mg Oral Daily  . mouth  rinse  15 mL Mouth Rinse BID  . sodium chloride flush  3 mL Intravenous Q12H  . spironolactone  12.5 mg Oral Daily   Continuous Infusions: . sodium chloride Stopped (02/10/19 2000)  . sodium chloride     PRN Meds:.sodium chloride, sodium chloride, acetaminophen **OR** acetaminophen, acetaminophen, albuterol, calcium carbonate, fluticasone, hydrALAZINE, methocarbamol, metoprolol tartrate, naLOXone (NARCAN)  injection, ondansetron (ZOFRAN) IV, ondansetron **OR** [DISCONTINUED] ondansetron (ZOFRAN) IV, polyethylene glycol, rOPINIRole, senna-docusate, sodium chloride flush   DVT prophylaxis: On heparin drip Code Status: Full code Family Communication: Updated Dr. Cyndia Bent on 6/23 who voiced concern about her recurrent nausea/vomiting Disposition Plan: Remains inpatient pending clearance by cardiology for CIR placement  Subjective: Patient reports about 5 episodes of emesis and diarrhea overnight.  Not sure if emesis or diarrhea was bloody.  She says her diarrhea is watery.  Had abdominal pain.  GI symptoms resolved this morning.  She denies chest pain, dyspnea or palpitation.   Objective: Vitals:   02/10/19 2000 02/11/19 0413 02/11/19 0500 02/11/19 1157  BP: 140/86 132/90  (!) 135/94  Pulse: 97 75  93  Resp: 17 16  15   Temp: 97.9 F (36.6 C) 98 F (36.7 C)  99.4 F (37.4 C)  TempSrc: Oral Oral  Oral  SpO2: 98% 92%  98%  Weight:   49.6 kg   Height:        Intake/Output Summary (Last 24 hours) at 02/11/2019 1300 Last data filed at 02/10/2019 1400 Gross per 24 hour  Intake 240 ml  Output -  Net 240 ml   Filed Weights   02/08/19 0452 02/09/19 0304 02/11/19 0500  Weight: 53.5 kg 51.4 kg 49.6 kg    Examination:  GENERAL: No acute distress.  Appears well.  HEENT: MMM.  Vision and hearing grossly intact.  NECK: Supple.  No apparent JVD. LUNGS:  No IWOB. Good air movement bilaterally. HEART:  RRR. Heart sounds normal.  ABD: Bowel sounds present. Soft. Non tender.  MSK/EXT:   Moves all extremities. No apparent deformity. No edema bilaterally.  SKIN: no apparent skin lesion or wound NEURO: Awake, alert and oriented appropriately.  No gross deficit.  PSYCH: Calm. Normal affect.  Consultants:   Cardiology  Procedures:   ETT 6/13-6/16  Thoracocentesis 6/16  R/LHC on 6/22-no significant CAD  Respiratory culture staph aureus  Pleural fluid culture negative  Microbiology: . COVID-19 negative . Blood cultures negative . RVP negative  Antimicrobials: Anti-infectives (From admission, onward)   Start     Dose/Rate Route Frequency Ordered Stop   02/06/19 1045  cefdinir (OMNICEF) capsule 300 mg  Status:  Discontinued     300 mg Oral Every 12 hours 02/06/19 1012 02/10/19 1537   02/05/19 0900  cefTRIAXone (ROCEPHIN) 2 g in sodium chloride 0.9 % 100 mL IVPB  Status:  Discontinued     2 g 200 mL/hr over 30 Minutes Intravenous Every 24 hours 02/05/19 0856 02/06/19 1013   02/04/19 1000  vancomycin (VANCOCIN) 1,250 mg in sodium chloride 0.9 % 250 mL IVPB  Status:  Discontinued     1,250 mg 166.7 mL/hr over 90 Minutes Intravenous Every 36 hours 02/04/19 0930 02/05/19 0856   02/03/19 1000  ceFEPIme (MAXIPIME) 2 g in sodium chloride 0.9 % 100 mL IVPB  Status:  Discontinued     2 g 200 mL/hr over 30 Minutes Intravenous Every 12 hours 02/03/19 0939 02/04/19 1017   02/02/19 1000  cefTRIAXone (ROCEPHIN) 2 g  in sodium chloride 0.9 % 100 mL IVPB  Status:  Discontinued     2 g 200 mL/hr over 30 Minutes Intravenous Every 24 hours 02/02/19 0958 02/03/19 0939   02/01/19 1800  cefTRIAXone (ROCEPHIN) 1 g in sodium chloride 0.9 % 100 mL IVPB  Status:  Discontinued     1 g 200 mL/hr over 30 Minutes Intravenous Every 24 hours 02/01/19 1647 02/02/19 0958      Data Reviewed: I have independently reviewed following labs and imaging studies   CBC: Recent Labs  Lab 02/05/19 0423  02/07/19 3267 02/08/19 0242 02/09/19 0444 02/10/19 0421 02/10/19 0954 02/10/19 1030 02/11/19  0405  WBC 7.2   < > 9.3 9.0 9.8 9.0  --   --  11.0*  NEUTROABS 5.1  --   --   --   --   --   --   --   --   HGB 10.8*   < > 12.8 12.3 13.2 14.1 13.3 13.9  13.3 14.3  HCT 33.4*   < > 38.6 37.4 39.4 42.4 39.0 41.0  39.0 43.8  MCV 98.8   < > 96.3 97.1 95.9 96.8  --   --  95.0  PLT 152   < > 250 240 265 274  --   --  318   < > = values in this interval not displayed.   Basic Metabolic Panel: Recent Labs  Lab 02/05/19 0423  02/07/19 1245 02/08/19 0242 02/09/19 0444 02/10/19 0421 02/10/19 0954 02/10/19 1030 02/11/19 0405  NA  --    < > 142 140 139 137 139 136  137 134*  K  --    < > 3.6 3.7 3.5 4.3 4.3 4.4  3.9 4.2  CL  --    < > 100 102 101 102  --   --  98  CO2  --    < > 29 27 27 25   --   --  24  GLUCOSE  --    < > 110* 101* 98 95  --   --  104*  BUN  --    < > 12 12 12 10   --   --  11  CREATININE  --    < > 0.79 0.85 0.83 0.92  --   --  0.93  CALCIUM  --    < > 9.5 9.3 9.1 9.4  --   --  9.9  MG 1.8   < > 2.0 1.9 1.6* 2.3  --   --  1.9  PHOS 3.7  --   --   --   --   --   --   --   --    < > = values in this interval not displayed.   GFR: Estimated Creatinine Clearance: 42.2 mL/min (by C-G formula based on SCr of 0.93 mg/dL). Liver Function Tests: Recent Labs  Lab 02/09/19 0444 02/10/19 0421 02/11/19 0405  AST 22 21 20   ALT 81* 65* 52*  ALKPHOS 50 53 53  BILITOT 1.1 0.9 1.5*  PROT 5.4* 5.4* 5.9*  ALBUMIN 3.0* 3.2* 3.3*   No results for input(s): LIPASE, AMYLASE in the last 168 hours. No results for input(s): AMMONIA in the last 168 hours. Coagulation Profile: No results for input(s): INR, PROTIME in the last 168 hours. Cardiac Enzymes: No results for input(s): CKTOTAL, CKMB, CKMBINDEX, TROPONINI in the last 168 hours. BNP (last 3 results) No results for input(s): PROBNP in the last 8760 hours. HbA1C:  No results for input(s): HGBA1C in the last 72 hours. CBG: Recent Labs  Lab 02/10/19 2005 02/10/19 2322 02/11/19 0413 02/11/19 0821 02/11/19 1155  GLUCAP  93 96 101* 101* 109*   Lipid Profile: No results for input(s): CHOL, HDL, LDLCALC, TRIG, CHOLHDL, LDLDIRECT in the last 72 hours. Thyroid Function Tests: No results for input(s): TSH, T4TOTAL, FREET4, T3FREE, THYROIDAB in the last 72 hours. Anemia Panel: No results for input(s): VITAMINB12, FOLATE, FERRITIN, TIBC, IRON, RETICCTPCT in the last 72 hours. Urine analysis:    Component Value Date/Time   COLORURINE YELLOW 02/01/2019 1003   APPEARANCEUR CLEAR 02/01/2019 1003   LABSPEC 1.012 02/01/2019 1003   PHURINE 7.0 02/01/2019 1003   GLUCOSEU 50 (A) 02/01/2019 1003   HGBUR SMALL (A) 02/01/2019 1003   BILIRUBINUR NEGATIVE 02/01/2019 1003   KETONESUR 5 (A) 02/01/2019 1003   PROTEINUR NEGATIVE 02/01/2019 1003   NITRITE NEGATIVE 02/01/2019 1003   LEUKOCYTESUR NEGATIVE 02/01/2019 1003   Sepsis Labs: Invalid input(s): PROCALCITONIN, LACTICIDVEN  Recent Results (from the past 240 hour(s))  Novel Coronavirus, NAA (hospital order; send-out to ref lab)     Status: None   Collection Time: 02/01/19  3:25 PM   Specimen: Nasopharyngeal Swab; Respiratory  Result Value Ref Range Status   SARS-CoV-2, NAA NOT DETECTED NOT DETECTED Final    Comment: (NOTE) This test was developed and its performance characteristics determined by [employer]. This test has not been FDA cleared or approved. This test has been authorized by FDA under an Emergency Use Authorization (EUA). This test is only authorized for the duration of time the declaration that circumstances exist justifying the authorization of the emergency use of in vitro diagnostic tests for detection of SARS-CoV-2 virus and/or diagnosis of COVID-19 infection under section 564(b)(1) of the Act, 21 U.S.C. 409WJX-9(J)(4), unless the authorization is terminated or revoked sooner. When diagnostic testing is negative, the possibility of a false negative result should be considered in the context of a patient's recent exposures and the  presence of clinical signs and symptoms consistent with COVID-19. An individual without symptoms of COVID-19 and who is not shedding SARS-CoV-2 virus would expect to have a negative (not detected) result in this assay. Performed  At: [facility] [address] [place], [place] [id number] Rush Farmer MD YQ:[id number]    [place]  Final    Comment: Performed at [facility], [place] [address] [place], [place] [address]  SARS Coronavirus 2     Status: None   Collection Time: 02/01/19  5:05 PM  Result Value Ref Range Status   SARS Coronavirus 2 NOT DETECTED NOT DETECTED Final    Comment: (NOTE) SARS-CoV-2 target nucleic acids are NOT DETECTED. The SARS-CoV-2 RNA is generally detectable in upper and lower respiratory specimens during the acute phase of infection.  Negative  results do not preclude SARS-CoV-2 infection, do not rule out co-infections with other pathogens, and should not be used as the sole basis for treatment or other patient management decisions.  Negative results must be combined with clinical observations, patient history, and epidemiological information. The expected result is Not Detected. Fact Sheet for Patients: http://www.biofiredefense.com/wp-content/uploads/2020/03/BIOFIRE-COVID -19-patients.pdf Fact Sheet for Healthcare Providers: http://www.biofiredefense.com/wp-content/uploads/2020/03/BIOFIRE-COVID -19-hcp.pdf This test is not yet approved or cleared by the [place] and  has been authorized for detection and/or diagnosis of SARS-CoV-2 by FDA under an Emergency Use Authorization (EUA).  This EUA will remain in effec t (meaning this test can be used) for the duration of  the  COVID-19 declaration under Section 564(b)(1) of the Act, 21 U.S.C. section 360bbb-3(b)(1), unless the authorization is terminated or revoked sooner. Performed at [facility], [place] [address] [place], [place] [address]   MRSA  PCR Screening     Status: None   Collection Time: 02/01/19  8:46 PM   Specimen: Nasopharyngeal  Result Value Ref Range Status   MRSA by PCR NEGATIVE NEGATIVE Final    Comment:        The GeneXpert MRSA Assay (FDA approved for NASAL specimens only), is one component of a comprehensive MRSA colonization surveillance program. It is not intended to diagnose MRSA infection nor to guide or monitor treatment for MRSA infections. Performed at [facility], [place] [address] [place], [place] [address]   Culture, blood (routine x 2)     Status: None   Collection Time: 02/01/19  9:28 PM   Specimen: BLOOD  Result Value Ref Range Status   Specimen Description BLOOD RIGHT ANTECUBITAL  Final   Special Requests   Final    BOTTLES DRAWN AEROBIC ONLY Blood Culture adequate volume   Culture   Final    NO GROWTH 5 DAYS Performed at [facility], [place] [address], [place], [place] [address]    Report Status 02/06/2019 FINAL  Final  Culture, blood (routine x 2)     Status: None   Collection Time: 02/01/19  9:28 PM   Specimen: BLOOD  Result Value Ref Range Status   Specimen Description BLOOD RIGHT ANTECUBITAL  Final   Special Requests   Final    BOTTLES DRAWN AEROBIC ONLY Blood Culture adequate volume   Culture   Final    NO GROWTH 5 DAYS Performed at [facility], [place] [address], [place], [place] [address]    Report Status 02/06/2019 FINAL  Final  Culture, respiratory (non-expectorated)     Status: None   Collection Time: 02/02/19  4:40 PM   Specimen: Tracheal Aspirate; Respiratory  Result Value Ref Range Status   Specimen Description TRACHEAL ASPIRATE  Final   Special Requests Normal  Final   Gram Stain   Final    RARE WBC PRESENT,BOTH PMN AND MONONUCLEAR RARE GRAM POSITIVE COCCI IN PAIRS Performed at [facility], [place] [address] [place], [place] [address]    Culture MODERATE STAPHYLOCOCCUS AUREUS  Final   Report Status 02/05/2019 FINAL  Final   Organism ID,  Bacteria STAPHYLOCOCCUS AUREUS  Final      Susceptibility   Staphylococcus aureus - MIC*    CIPROFLOXACIN <=0.5 SENSITIVE Sensitive     ERYTHROMYCIN <=0.25 SENSITIVE Sensitive     GENTAMICIN <=0.5 SENSITIVE Sensitive     OXACILLIN <=0.25 SENSITIVE Sensitive     TETRACYCLINE <=1 SENSITIVE Sensitive     VANCOMYCIN 1 SENSITIVE Sensitive     TRIMETH/SULFA <=10 SENSITIVE Sensitive     CLINDAMYCIN <=0.25 SENSITIVE Sensitive     RIFAMPIN <=0.5 SENSITIVE Sensitive     Inducible Clindamycin NEGATIVE Sensitive     * MODERATE STAPHYLOCOCCUS AUREUS  Respiratory Panel by PCR     Status: None   Collection Time: 02/03/19  9:33 AM   Specimen: Nasopharyngeal Swab; Respiratory  Result Value Ref Range Status   Adenovirus NOT DETECTED NOT DETECTED Final   Coronavirus 229E NOT DETECTED NOT DETECTED Final    Comment: (NOTE) The Coronavirus on the Respiratory Panel, DOES NOT test for the novel  Coronavirus (2019 nCoV)    Coronavirus HKU1 NOT DETECTED NOT  DETECTED Final   Coronavirus NL63 NOT DETECTED NOT DETECTED Final   Coronavirus OC43 NOT DETECTED NOT DETECTED Final   Metapneumovirus NOT DETECTED NOT DETECTED Final   Rhinovirus / Enterovirus NOT DETECTED NOT DETECTED Final   Influenza A NOT DETECTED NOT DETECTED Final   Influenza B NOT DETECTED NOT DETECTED Final   Parainfluenza Virus 1 NOT DETECTED NOT DETECTED Final   Parainfluenza Virus 2 NOT DETECTED NOT DETECTED Final   Parainfluenza Virus 3 NOT DETECTED NOT DETECTED Final   Parainfluenza Virus 4 NOT DETECTED NOT DETECTED Final   Respiratory Syncytial Virus NOT DETECTED NOT DETECTED Final   Bordetella pertussis NOT DETECTED NOT DETECTED Final   Chlamydophila pneumoniae NOT DETECTED NOT DETECTED Final   Mycoplasma pneumoniae NOT DETECTED NOT DETECTED Final    Comment: Performed at [facility], [place] [address] [place], [place] [address]  Body fluid culture (includes gram stain)     Status: None   Collection Time: 02/04/19  1:10 PM    Specimen: Pleural Fluid  Result Value Ref Range Status   Specimen Description PLEURAL  Final   Special Requests FLUID  Final   Gram Stain   Final    ABUNDANT WBC PRESENT,BOTH PMN AND MONONUCLEAR NO ORGANISMS SEEN    Culture   Final    NO GROWTH Performed at [facility], [address], [place], [place] [address]    Report Status 02/07/2019 FINAL  Final     Radiology Studies: No results found.  Jazmen Lindenbaum T. [place]  If 7PM-7AM, please contact night-coverage [email or web address] Password [email or web address] 02/11/2019, 1:00 PM

## 2019-02-11 NOTE — Progress Notes (Addendum)
Advanced Heart Failure Rounding Note   Subjective:    Had rough night with ab pain with n/v and some diarrhea. Now feeling a bit better but very weak.   Cath yesterday with normal cors and filling pressures.   AF rates in 80-90s. Off heparin overnight.   Objective:   Weight Range:  Vital Signs:   Temp:  [97.8 F (36.6 C)-99.4 F (37.4 C)] 99.4 F (37.4 C) (06/23 1157) Pulse Rate:  [75-97] 93 (06/23 1157) Resp:  [15-17] 15 (06/23 1157) BP: (114-140)/(71-94) 135/94 (06/23 1157) SpO2:  [92 %-98 %] 98 % (06/23 1157) Weight:  [49.6 kg] 49.6 kg (06/23 0500) Last BM Date: 02/11/19  Weight change: Filed Weights   02/08/19 0452 02/09/19 0304 02/11/19 0500  Weight: 53.5 kg 51.4 kg 49.6 kg    Intake/Output:   Intake/Output Summary (Last 24 hours) at 02/11/2019 1347 Last data filed at 02/10/2019 1400 Gross per 24 hour  Intake 240 ml  Output -  Net 240 ml     Physical Exam: General:  Lying in bed, weak . No resp difficulty HEENT: normal Neck: supple. no JVD. Carotids 2+ bilat; no bruits. No lymphadenopathy or thryomegaly appreciated. Cor: PMI nondisplaced. Irregular rate & rhythm. No rubs, gallops or murmurs. Lungs: clear Abdomen: soft, nontender, nondistended. No hepatosplenomegaly. No bruits or masses. Good bowel sounds. Extremities: no cyanosis, clubbing, rash, edema Neuro: alert & orientedx3, cranial nerves grossly intact. moves all 4 extremities w/o difficulty. Affect pleasant    Telemetry:  AF 80s Personally reviewed   Labs: Basic Metabolic Panel: Recent Labs  Lab 02/05/19 0423  02/07/19 5643 02/08/19 0242 02/09/19 0444 02/10/19 0421 02/10/19 0954 02/10/19 1030 02/11/19 0405  NA  --    < > 142 140 139 137 139 136  137 134*  K  --    < > 3.6 3.7 3.5 4.3 4.3 4.4  3.9 4.2  CL  --    < > 100 102 101 102  --   --  98  CO2  --    < > 29 27 27 25   --   --  24  GLUCOSE  --    < > 110* 101* 98 95  --   --  104*  BUN  --    < > 12 12 12 10   --   --  11   CREATININE  --    < > 0.79 0.85 0.83 0.92  --   --  0.93  CALCIUM  --    < > 9.5 9.3 9.1 9.4  --   --  9.9  MG 1.8   < > 2.0 1.9 1.6* 2.3  --   --  1.9  PHOS 3.7  --   --   --   --   --   --   --   --    < > = values in this interval not displayed.    Liver Function Tests: Recent Labs  Lab 02/09/19 0444 02/10/19 0421 02/11/19 0405  AST 22 21 20   ALT 81* 65* 52*  ALKPHOS 50 53 53  BILITOT 1.1 0.9 1.5*  PROT 5.4* 5.4* 5.9*  ALBUMIN 3.0* 3.2* 3.3*   Recent Labs  Lab 02/11/19 0405  LIPASE 33   No results for input(s): AMMONIA in the last 168 hours.  CBC: Recent Labs  Lab 02/05/19 0423  02/07/19 3295 02/08/19 0242 02/09/19 0444 02/10/19 0421 02/10/19 0954 02/10/19 1030 02/11/19 0405  WBC 7.2   < >  9.3 9.0 9.8 9.0  --   --  11.0*  NEUTROABS 5.1  --   --   --   --   --   --   --   --   HGB 10.8*   < > 12.8 12.3 13.2 14.1 13.3 13.9  13.3 14.3  HCT 33.4*   < > 38.6 37.4 39.4 42.4 39.0 41.0  39.0 43.8  MCV 98.8   < > 96.3 97.1 95.9 96.8  --   --  95.0  PLT 152   < > 250 240 265 274  --   --  318   < > = values in this interval not displayed.    Cardiac Enzymes: No results for input(s): CKTOTAL, CKMB, CKMBINDEX, TROPONINI in the last 168 hours.  BNP: BNP (last 3 results) Recent Labs    02/09/19 0444  BNP 1,708.7*    ProBNP (last 3 results) No results for input(s): PROBNP in the last 8760 hours.    Other results:  Imaging: No results found.   Medications:     Scheduled Medications: . atorvastatin  10 mg Oral Daily  . bisoprolol  2.5 mg Oral Daily  . buPROPion  150 mg Oral Daily  . Chlorhexidine Gluconate Cloth  6 each Topical Daily  . feeding supplement (ENSURE ENLIVE)  237 mL Oral TID BM  . fentaNYL  1 patch Transdermal Q72H  . FLUoxetine  20 mg Oral Daily  . insulin aspart  0-15 Units Subcutaneous Q4H  . levothyroxine  88 mcg Oral Daily  . losartan  25 mg Oral Daily  . mouth rinse  15 mL Mouth Rinse BID  . sodium chloride flush  3 mL  Intravenous Q12H  . spironolactone  12.5 mg Oral Daily    Infusions: . sodium chloride Stopped (02/10/19 2000)  . sodium chloride      PRN Medications: sodium chloride, sodium chloride, acetaminophen **OR** acetaminophen, acetaminophen, albuterol, calcium carbonate, fluticasone, hydrALAZINE, methocarbamol, metoprolol tartrate, naLOXone (NARCAN)  injection, ondansetron (ZOFRAN) IV, ondansetron **OR** [DISCONTINUED] ondansetron (ZOFRAN) IV, polyethylene glycol, rOPINIRole, senna-docusate, sodium chloride flush   Assessment/plan :   1. Acute on chronic hypoxic respiratory failure in setting of aspiration PNA - has severe underlying COPD on CXR. Per chart quit tobacco in 2001. No PFTs on chart - admitted with severe R-sided PNA likely due to aspiration. - intubated 6/13. Extubated 6/16 - much improved after R thors 6/16 (transudate)  - She is euvolemic. Can hold lasix in setting of n/v/d  2. Chronic AF with RVR - rate much improved on bisoprolol 2.5. will continue - Eliquis switched to heparin for cath. Can restart Eliquis today unless GI procedures planned. If so, would restart heparin. Await GI input.   3. Acute systolic HF - echo from 0/62 shows EF 20% with Tako-tsubo pattern. Suspect stress CM - repeat echo 6/22 EF 50- 55% Personally reviewed - Continue low-dose losartan, spiro and digoxin and po lasix - Cath 6/22 normal cors   4. NSTEMI - Troponin elevated in 2-3 range. Now coming down. No CP ECG with new iRBBB and mild lateral TW changes - suspect demand ischemia in setting of severe medical illness.  - however echo from 6/14 suggestive of Tako-Tsubo EF 20%. Cath 6/22 normal cors  - continue statin - stop ASA with Eliquis   5. Hypokalemia/hypomag - replete.  6. Chronic GI issues and malnutrition - now with aspiration PNA. - has recurrent symptoms. GI consult pending      Length of  Stay: 9   Glori Bickers  MD 02/11/2019, 1:47 PM  Advanced Heart Failure Team  Pager [phone] (M-F; [place])  Please contact [facility] for night-coverage after hours (4p -7a ) and weekends on [email or web address]

## 2019-02-12 ENCOUNTER — Encounter (HOSPITAL_COMMUNITY)
Admission: EM | Disposition: A | Discharge status: Rehabilitation Facility | Payer: Self-pay | Source: Home / Self Care | Attending: Internal Medicine

## 2019-02-12 ENCOUNTER — Encounter (HOSPITAL_COMMUNITY): Payer: Self-pay

## 2019-02-12 ENCOUNTER — Inpatient Hospital Stay (HOSPITAL_COMMUNITY): Payer: Medicare Other | Admitting: Certified Registered"

## 2019-02-12 DIAGNOSIS — R112 Nausea with vomiting, unspecified: Secondary | ICD-10-CM

## 2019-02-12 DIAGNOSIS — R197 Diarrhea, unspecified: Secondary | ICD-10-CM

## 2019-02-12 DIAGNOSIS — J690 Pneumonitis due to inhalation of food and vomit: Secondary | ICD-10-CM

## 2019-02-12 DIAGNOSIS — R110 Nausea: Secondary | ICD-10-CM

## 2019-02-12 DIAGNOSIS — K529 Noninfective gastroenteritis and colitis, unspecified: Secondary | ICD-10-CM

## 2019-02-12 HISTORY — PX: BIOPSY: SHX5522

## 2019-02-12 HISTORY — PX: ESOPHAGOGASTRODUODENOSCOPY (EGD) WITH PROPOFOL: SHX5813

## 2019-02-12 HISTORY — PX: POLYPECTOMY: SHX5525

## 2019-02-12 LAB — GLUCOSE, CAPILLARY
Glucose-Capillary: 113 mg/dL — ABNORMAL HIGH (ref 70–99)
Glucose-Capillary: 104 mg/dL — ABNORMAL HIGH (ref 70–99)
Glucose-Capillary: 79 mg/dL (ref 70–99)
Glucose-Capillary: 109 mg/dL — ABNORMAL HIGH (ref 70–99)
Glucose-Capillary: 79 mg/dL (ref 70–99)
Glucose-Capillary: 100 mg/dL — ABNORMAL HIGH (ref 70–99)

## 2019-02-12 LAB — COMPREHENSIVE METABOLIC PANEL
ALT: 44 U/L (ref 0–44)
AST: 19 U/L (ref 15–41)
Albumin: 3.5 g/dL (ref 3.5–5.0)
Alkaline Phosphatase: 55 U/L (ref 38–126)
Anion gap: 12 (ref 5–15)
BUN: 9 mg/dL (ref 8–23)
CO2: 23 mmol/L (ref 22–32)
Calcium: 9.9 mg/dL (ref 8.9–10.3)
Chloride: 98 mmol/L (ref 98–111)
Creatinine, Ser: 0.94 mg/dL (ref 0.44–1.00)
GFR calc Af Amer: >60 mL/min (ref >60)
GFR calc non Af Amer: >60 mL/min (ref >60)
Glucose, Bld: 105 mg/dL — ABNORMAL HIGH (ref 70–99)
Potassium: 4.2 mmol/L (ref 3.5–5.1)
Sodium: 133 mmol/L — ABNORMAL LOW (ref 135–145)
Total Bilirubin: 1.4 mg/dL — ABNORMAL HIGH (ref 0.3–1.2)
Total Protein: 6 g/dL — ABNORMAL LOW (ref 6.5–8.1)

## 2019-02-12 LAB — CBC
HCT: 42.8 % (ref 36.0–46.0)
Hemoglobin: 14.5 g/dL (ref 12.0–15.0)
MCH: 32.1 pg (ref 26.0–34.0)
MCHC: 33.9 g/dL (ref 30.0–36.0)
MCV: 94.7 fL (ref 80.0–100.0)
Platelets: 303 10*3/uL (ref 150–400)
RBC: 4.52 MIL/uL (ref 3.87–5.11)
RDW: 12.8 % (ref 11.5–15.5)
WBC: 11.7 10*3/uL — ABNORMAL HIGH (ref 4.0–10.5)
nRBC: 0 % (ref 0.0–0.2)

## 2019-02-12 LAB — MAGNESIUM: Magnesium: 2.1 mg/dL (ref 1.7–2.4)

## 2019-02-12 LAB — HEPARIN LEVEL (UNFRACTIONATED)
Heparin Unfractionated: 0.79 IU/mL — ABNORMAL HIGH (ref 0.30–0.70)
Heparin Unfractionated: 0.26 IU/mL — ABNORMAL LOW (ref 0.30–0.70)

## 2019-02-12 SURGERY — ESOPHAGOGASTRODUODENOSCOPY (EGD) WITH PROPOFOL
Anesthesia: Monitor Anesthesia Care

## 2019-02-12 MED ORDER — HEPARIN (PORCINE) 25000 UT/250ML-% IV SOLN
1100.0000 [IU]/h | INTRAVENOUS | Status: DC
  Administered 2019-02-12: 1050 [IU]/h via INTRAVENOUS
  Filled 2019-02-12: qty 250

## 2019-02-12 MED ORDER — METHOCARBAMOL 500 MG PO TABS: 500.0000 mg | ORAL_TABLET | Freq: Two times a day (BID) | ORAL | Status: DC | PRN

## 2019-02-12 MED ORDER — LACTATED RINGERS IV SOLN
INTRAVENOUS | Status: DC
  Administered 2019-02-12: 11:00:00 via INTRAVENOUS

## 2019-02-12 MED ORDER — PROPOFOL 500 MG/50ML IV EMUL
INTRAVENOUS | Status: DC | PRN
  Administered 2019-02-12: 125 ug/kg/min via INTRAVENOUS

## 2019-02-12 MED ORDER — SUCRALFATE 1 G PO TABS
1.0000 g | ORAL_TABLET | Freq: Four times a day (QID) | ORAL | Status: DC
  Administered 2019-02-12 – 2019-02-13 (×5): 1 g via ORAL
  Filled 2019-02-12 (×5): qty 1

## 2019-02-12 MED ORDER — COLESTIPOL HCL 1 G PO TABS
1.0000 g | ORAL_TABLET | Freq: Two times a day (BID) | ORAL | Status: DC
  Administered 2019-02-12 – 2019-02-13 (×3): 1 g via ORAL
  Filled 2019-02-12 (×4): qty 1

## 2019-02-12 MED ORDER — PROPOFOL 10 MG/ML IV BOLUS
INTRAVENOUS | Status: DC | PRN
  Administered 2019-02-12: 50 mg via INTRAVENOUS

## 2019-02-12 MED ORDER — ACETAMINOPHEN 325 MG PO TABS
650.0000 mg | ORAL_TABLET | Freq: Three times a day (TID) | ORAL | Status: DC
  Administered 2019-02-12 – 2019-02-13 (×4): 650 mg via ORAL
  Filled 2019-02-12 (×4): qty 2

## 2019-02-12 SURGICAL SUPPLY — 15 items

## 2019-02-12 NOTE — Progress Notes (Signed)
[place] for Heparin Indication: atrial fibrillation  Allergies  Allergen Reactions  . Penicillins     Causes rash Has patient had a PCN reaction causing immediate rash, facial/tongue/throat swelling, SOB or lightheadedness with hypotension: YES Has patient had a PCN reaction causing severe rash involving mucus membranes or skin necrosis: No Has patient had a PCN reaction that required hospitalization No Has patient had a PCN reaction occurring within the last 10 years: No If all of the above answers are "NO", then may proceed with Cephalosporin use.     Patient Measurements: Height: 5\' 5"  (165.1 cm) Weight: 116 lb 2.9 oz (52.7 kg) IBW/kg (Calculated) : 57 Heparin Dosing Weight: 53.2 kg  Vital Signs: Temp: 98.1 F (36.7 C) (06/24 0415) Temp Source: Oral (06/24 0415) BP: 145/97 (06/24 0415) Pulse Rate: 80 (06/24 0415)  Labs: Recent Labs    02/10/19 0421  02/10/19 1030 02/11/19 0405 02/11/19 2026 02/12/19 0330  HGB 14.1   < > 13.9  13.3 14.3  --  14.5  HCT 42.4   < > 41.0  39.0 43.8  --  42.8  PLT 274  --   --  318  --  303  HEPARINUNFRC 0.74*  --   --   --  0.38 0.79*  CREATININE 0.92  --   --  0.93  --  0.94   < > = values in this interval not displayed.    Estimated Creatinine Clearance: 44.3 mL/min (by C-G formula based on SCr of 0.94 mg/dL).   Assessment: 73 y.o. female with h/o Afib, PTA apixaban on hold, for heparin. Pt seems to be sensitive to rate changes.  Heparin level above goal this morning at 0.79 on heparin 1150 units/hr. CBC stable. Heparin was held for EGD.   Goal of Therapy:  Heparin level 0.3-0.7 Monitor platelets by anticoagulation protocol: Yes   Plan:  Restart heparin at lower rate 1050 units/hr Heparin level in 6 hours  Claiborne Billings, PharmD PGY2 Cardiology Pharmacy Resident Please check AMION for all Pharmacist numbers by unit 02/12/2019 10:47 AM

## 2019-02-12 NOTE — Anesthesia Postprocedure Evaluation (Signed)
Anesthesia Post Note  Patient: [name]  Procedure(s) Performed: ESOPHAGOGASTRODUODENOSCOPY (EGD) WITH PROPOFOL (N/A ) BIOPSY POLYPECTOMY     Patient location during evaluation: Endoscopy Anesthesia Type: MAC Level of consciousness: awake and alert Pain management: pain level controlled Vital Signs Assessment: post-procedure vital signs reviewed and stable Respiratory status: spontaneous breathing, nonlabored ventilation and respiratory function stable Cardiovascular status: blood pressure returned to baseline and stable Postop Assessment: no apparent nausea or vomiting Anesthetic complications: no    Last Vitals:  Vitals:   02/12/19 1209 02/12/19 1219  BP: 110/75 111/64  Pulse: 77 91  Resp: 18 15  Temp:    SpO2: 100% 100%    Last Pain:  Vitals:   02/12/19 1219  TempSrc:   PainSc: 0-No pain                 Lidia Collum

## 2019-02-12 NOTE — Transfer of Care (Signed)
Immediate Anesthesia Transfer of Care Note  Patient: [name]  Procedure(s) Performed: ESOPHAGOGASTRODUODENOSCOPY (EGD) WITH PROPOFOL (N/A ) BIOPSY POLYPECTOMY  Patient Location: Endoscopy Unit  Anesthesia Type:MAC  Level of Consciousness: drowsy and patient cooperative  Airway & Oxygen Therapy: Patient Spontanous Breathing  Post-op Assessment: Report given to RN and Post -op Vital signs reviewed and stable  Post vital signs: Reviewed and stable  Last Vitals:  Vitals Value Taken Time  BP    Temp    Pulse    Resp    SpO2      Last Pain:  Vitals:   02/12/19 1159  TempSrc: Temporal  PainSc: 0-No pain      Patients Stated Pain Goal: 0 (08/30/01 4961)  Complications: No apparent anesthesia complications

## 2019-02-12 NOTE — Progress Notes (Signed)
Discussed with pt decision to d/c fentanyl patch, pt voiced concern of taking this off, educated pt on MD recommendations to d/c patch. Pt still wanting to keep patch. Dr. Cyndia Skeeters aware, per MD leave patch on tonight as it is due to be changed tomorrow 02/13/2019. Amanda Cockayne, RN

## 2019-02-12 NOTE — Interval H&P Note (Signed)
History and Physical Interval Note:  02/12/2019 11:00 AM  [name]  has presented today for surgery, with the diagnosis of nausea and vomiting.  The various methods of treatment have been discussed with the patient and family. After consideration of risks, benefits and other options for treatment, the patient has consented to  Procedure(s): ESOPHAGOGASTRODUODENOSCOPY (EGD) WITH PROPOFOL (N/A) as a surgical intervention.  The patient's history has been reviewed, patient examined, no change in status, stable for surgery.  I have reviewed the patient's chart and labs.  Questions were answered to the patient's satisfaction.     [place]

## 2019-02-12 NOTE — Progress Notes (Signed)
[place] for Heparin Indication: atrial fibrillation  Allergies  Allergen Reactions  . Penicillins     Causes rash Has patient had a PCN reaction causing immediate rash, facial/tongue/throat swelling, SOB or lightheadedness with hypotension: YES Has patient had a PCN reaction causing severe rash involving mucus membranes or skin necrosis: No Has patient had a PCN reaction that required hospitalization No Has patient had a PCN reaction occurring within the last 10 years: No If all of the above answers are "NO", then may proceed with Cephalosporin use.     Patient Measurements: Height: 5\' 5"  (165.1 cm) Weight: 116 lb 2.9 oz (52.7 kg) IBW/kg (Calculated) : 57 Heparin Dosing Weight: 53.2 kg  Vital Signs: Temp: 98 F (36.7 C) (06/24 1938) Temp Source: Oral (06/24 1938) BP: 121/80 (06/24 1938) Pulse Rate: 80 (06/24 1938)  Labs: Recent Labs    02/10/19 0421  02/10/19 1030 02/11/19 0405 02/11/19 2026 02/12/19 0330 02/12/19 1855  HGB 14.1   < > 13.9  13.3 14.3  --  14.5  --   HCT 42.4   < > 41.0  39.0 43.8  --  42.8  --   PLT 274  --   --  318  --  303  --   HEPARINUNFRC 0.74*  --   --   --  0.38 0.79* 0.26*  CREATININE 0.92  --   --  0.93  --  0.94  --    < > = values in this interval not displayed.    Estimated Creatinine Clearance: 44.3 mL/min (by C-G formula based on SCr of 0.94 mg/dL).   Assessment: 73 y.o. female with h/o Afib, PTA apixaban on hold, for heparin. Pt seems to be sensitive to rate changes.  Heparin level above goal this morning at 0.79 on heparin 1150 units/hr. CBC stable. Heparin level this evening below goal at 0.26.  No overt bleeding or complications noted.  Goal of Therapy:  Heparin level 0.3-0.7 Monitor platelets by anticoagulation protocol: Yes   Plan:  Increase IV heparin just slightly to 1100 units/hr. Recheck heparin level with AM labs. Daily heparin level and CBC.  Marguerite Olea, [facility] Clinical Pharmacist Phone [phone]  02/12/2019 8:43 PM

## 2019-02-12 NOTE — Progress Notes (Signed)
PROGRESS NOTE  [name] DUK:[id number] DOB: 1946-01-05 DOA: 02/01/2019 PCP: Prince Solian, MD   LOS: 10 days   Patient is from: Home.  Uses walker for ambulation at baseline.  Brief Narrative / Interim history: 73 year old with history of essential hypertension, hyperlipidemia, paroxysmal A. fib on Eliquis, restless leg syndrome, hypothyroidism, CVA, depression, COPD initially presented with weakness and right knee pain.  Found to have significant electrolyte abnormality and diarrhea developed significant hypoxia.  Diagnosed with staph aureus pneumonia and right-sided transudative pleural effusion requiring thoracentesis 6/16.  Intubated from 6/13-6/16, central line placed 6/13.  Initially on vancomycin and cefepime later switched to Rocephin.  Also diagnosed with systolic CHF with ejection fraction 20%/Takotsubo pattern, heart failure team consulted. Limited echo on 6/22 with EF of 55%.  R/LHC with normal coronary arteries, LV function and low filling pressures.    Assessment & Plan: Recurrent nausea/vomiting/diarrhea/abdominal pain: Chronic issue.  Was evaluated by GI, Dr. Hilarie Fredrickson in the past but had no explanation.  Had mild LFT which is downtrending.  Has had aspiration pneumonia requiring intubation this admission.  Abdominal exam is benign today.  She has no fever or significant leukocytosis.  Lipase within normal range.  CT abdomen and pelvis without significant finding. -Plan for EGD by GI today. -We will discontinue fentanyl per discussion with patient and family after EGD -Scheduled Tylenol for pain.  Acute on chronic hypoxemic respiratory failure-multifactorial including aspiration/MSSA pneumonia, right-sided pleural effusion and acute systolic CHF.  Patient is currently on room air. -ETT 6/13-6/16 in the setting of encephalopathy.  -Treat treatable causes as below.  Acute on chronic systolic CHF: Initially concerned about Takotsubo CM.  Echo on 6/14 with EF of 20%.  Repeat  limited echo on 6/22 with EF of 55%.  R/LHC on 6/22 not impressive.  Patient appears euvolemic.  Respiratory failure resolved.  Intake output was not captured well in epic. -Cardiology managing:  -On bisoprolol, losartan, Aldactone, heparin and statin -Daily weight, intake output and renal function -Closely monitor electrolytes and replenish aggressively.  Right-sided pleural effusion: thoracocentesis on 6/16 with removal of 750 cc culture negative transudative fluid.  Suspect this to be due to CHF. -Manage CHF as above  Aspiration pneumonia: Concern about this based on chest x-ray which showed right lung opacity which could also be due to pleural effusion.  Respiratory culture with moderate staph aureus.  Blood cultures negative.  Procalcitonin negative.  -Ceftriaxone 6/13-6/15, 6/17- 6/18 -Cefepime 6/15-6/16 -Cefdinir 6/18-6/22  Chronic atrial fibrillation with intermittent RVR: Stable.  On Cardizem and Eliquis at home -Continue bisoprolol and heparin drip -We will resume home Eliquis after EGD if no significant finding.  History of right nephrectomy -Avoid nephrotoxic meds  Moderate protein calorie malnutrition: Likely due to acute illness.  BMI 18.8 -Appreciate dietitian input-dysphagia diet, supplements  Acute metabolic encephalopathy: Likely due to acute illness and possible delirium.  Resolved.  Anxiety/depression: Stable -Continue home Wellbutrin and Prozac although this is not a good choice for anxiety.  Hypothyroidism: Stable. -Continue home Synthroid  Generalized weakness/debility -PT/OT-CIR.   Scheduled Meds:  atorvastatin  10 mg Oral Daily   bisoprolol  2.5 mg Oral Daily   buPROPion  150 mg Oral Daily   Chlorhexidine Gluconate Cloth  6 each Topical Daily   feeding supplement (ENSURE ENLIVE)  237 mL Oral TID BM   fentaNYL  1 patch Transdermal Q72H   FLUoxetine  20 mg Oral Daily   insulin aspart  0-15 Units Subcutaneous Q4H   levothyroxine  88 mcg  Oral  Daily   losartan  25 mg Oral Daily   mouth rinse  15 mL Mouth Rinse BID   pantoprazole  40 mg Oral BID   sodium chloride flush  3 mL Intravenous Q12H   spironolactone  12.5 mg Oral Daily   sucralfate  1 g Oral Q6H   Continuous Infusions:  sodium chloride Stopped (02/10/19 2000)   sodium chloride     PRN Meds:.sodium chloride, sodium chloride, acetaminophen **OR** acetaminophen, acetaminophen, albuterol, calcium carbonate, fluticasone, hydrALAZINE, methocarbamol, metoprolol tartrate, naLOXone (NARCAN)  injection, ondansetron (ZOFRAN) IV, ondansetron **OR** [DISCONTINUED] ondansetron (ZOFRAN) IV, polyethylene glycol, rOPINIRole, senna-docusate, sodium chloride flush   DVT prophylaxis: On heparin drip Code Status: Full code Family Communication: Updated Dr. Cyndia Bent at bedside. Disposition Plan: Remains inpatient pending clearance by cardiology and GI.  Final disposition CIR.  Subjective: No major events overnight of this morning.  She had no further emesis, diarrhea or abdominal pain.  She felt nauseous overnight.  Nausea resolved with IV Zofran.  Denies chest pain, dyspnea or palpitation.  Family, Dr. Vanetta Shawl at bedside.  Discussed about CT abdomen and pelvis and the plan going forward.  Patient in agreement on discontinuing fentanyl patch to see if a GI symptoms are related to this.  She is only on 25 MCG at this time.   Objective: Vitals:   02/12/19 1102 02/12/19 1159 02/12/19 1209 02/12/19 1219  BP: (!) 146/78 (!) 65/41 110/75 111/64  Pulse: 92 88 77 91  Resp: 18 15 18 15   Temp: 99.4 F (37.4 C) (!) 97.3 F (36.3 C)    TempSrc: Oral Temporal    SpO2: 97% 100% 100% 100%  Weight:      Height:        Intake/Output Summary (Last 24 hours) at 02/12/2019 1315 Last data filed at 02/12/2019 1202 Gross per 24 hour  Intake 511.94 ml  Output 0 ml  Net 511.94 ml   Filed Weights   02/09/19 0304 02/11/19 0500 02/12/19 0414  Weight: 51.4 kg 49.6 kg 52.7 kg     Examination: GENERAL: No acute distress.  Appears well.  HEENT: MMM.  Vision and hearing grossly intact.  NECK: Supple.  No apparent JVD. LUNGS:  No IWOB. Good air movement bilaterally. HEART:  RRR. Heart sounds normal.  ABD: Bowel sounds present. Soft. Non tender.  MSK/EXT:  Moves all extremities. No apparent deformity. No edema bilaterally.  SKIN: no apparent skin lesion or wound NEURO: Awake, alert and oriented appropriately.  No gross deficit.  PSYCH: Calm. Normal affect.   Consultants:   Cardiology  Procedures:   ETT 6/13-6/16  Thoracocentesis 6/16  R/LHC on 6/22-no significant CAD  Respiratory culture MSSA  Pleural fluid culture negative  Microbiology:  COVID-19 negative  Blood cultures negative  RVP negative  Antimicrobials: Anti-infectives (From admission, onward)   Start     Dose/Rate Route Frequency Ordered Stop   02/06/19 1045  cefdinir (OMNICEF) capsule 300 mg  Status:  Discontinued     300 mg Oral Every 12 hours 02/06/19 1012 02/10/19 1537   02/05/19 0900  cefTRIAXone (ROCEPHIN) 2 g in sodium chloride 0.9 % 100 mL IVPB  Status:  Discontinued     2 g 200 mL/hr over 30 Minutes Intravenous Every 24 hours 02/05/19 0856 02/06/19 1013   02/04/19 1000  vancomycin (VANCOCIN) 1,250 mg in sodium chloride 0.9 % 250 mL IVPB  Status:  Discontinued     1,250 mg 166.7 mL/hr over 90 Minutes Intravenous Every 36 hours 02/04/19 0930  02/05/19 0856   02/03/19 1000  ceFEPIme (MAXIPIME) 2 g in sodium chloride 0.9 % 100 mL IVPB  Status:  Discontinued     2 g 200 mL/hr over 30 Minutes Intravenous Every 12 hours 02/03/19 0939 02/04/19 1017   02/02/19 1000  cefTRIAXone (ROCEPHIN) 2 g in sodium chloride 0.9 % 100 mL IVPB  Status:  Discontinued     2 g 200 mL/hr over 30 Minutes Intravenous Every 24 hours 02/02/19 0958 02/03/19 0939   02/01/19 1800  cefTRIAXone (ROCEPHIN) 1 g in sodium chloride 0.9 % 100 mL IVPB  Status:  Discontinued     1 g 200 mL/hr over 30 Minutes  Intravenous Every 24 hours 02/01/19 1647 02/02/19 0958      Data Reviewed: I have independently reviewed following labs and imaging studies   CBC: Recent Labs  Lab 02/08/19 0242 02/09/19 0444 02/10/19 0421 02/10/19 0954 02/10/19 1030 02/11/19 0405 02/12/19 0330  WBC 9.0 9.8 9.0  --   --  11.0* 11.7*  HGB 12.3 13.2 14.1 13.3 13.9   13.3 14.3 14.5  HCT 37.4 39.4 42.4 39.0 41.0   39.0 43.8 42.8  MCV 97.1 95.9 96.8  --   --  95.0 94.7  PLT 240 265 274  --   --  318 245   Basic Metabolic Panel: Recent Labs  Lab 02/08/19 0242 02/09/19 0444 02/10/19 0421 02/10/19 0954 02/10/19 1030 02/11/19 0405 02/12/19 0330  NA 140 139 137 139 136   137 134* 133*  K 3.7 3.5 4.3 4.3 4.4   3.9 4.2 4.2  CL 102 101 102  --   --  98 98  CO2 27 27 25   --   --  24 23  GLUCOSE 101* 98 95  --   --  104* 105*  BUN 12 12 10   --   --  11 9  CREATININE 0.85 0.83 0.92  --   --  0.93 0.94  CALCIUM 9.3 9.1 9.4  --   --  9.9 9.9  MG 1.9 1.6* 2.3  --   --  1.9 2.1   GFR: Estimated Creatinine Clearance: 44.3 mL/min (by C-G formula based on SCr of 0.94 mg/dL). Liver Function Tests: Recent Labs  Lab 02/09/19 0444 02/10/19 0421 02/11/19 0405 02/12/19 0330  AST 22 21 20 19   ALT 81* 65* 52* 44  ALKPHOS 50 53 53 55  BILITOT 1.1 0.9 1.5* 1.4*  PROT 5.4* 5.4* 5.9* 6.0*  ALBUMIN 3.0* 3.2* 3.3* 3.5   Recent Labs  Lab 02/11/19 0405  LIPASE 33   No results for input(s): AMMONIA in the last 168 hours. Coagulation Profile: No results for input(s): INR, PROTIME in the last 168 hours. Cardiac Enzymes: No results for input(s): CKTOTAL, CKMB, CKMBINDEX, TROPONINI in the last 168 hours. BNP (last 3 results) No results for input(s): PROBNP in the last 8760 hours. HbA1C: No results for input(s): HGBA1C in the last 72 hours. CBG: Recent Labs  Lab 02/11/19 2001 02/11/19 2301 02/12/19 0412 02/12/19 0848 02/12/19 1313  GLUCAP 133* 106* 113* 104* 79   Lipid Profile: No results for input(s): CHOL,  HDL, LDLCALC, TRIG, CHOLHDL, LDLDIRECT in the last 72 hours. Thyroid Function Tests: No results for input(s): TSH, T4TOTAL, FREET4, T3FREE, THYROIDAB in the last 72 hours. Anemia Panel: No results for input(s): VITAMINB12, FOLATE, FERRITIN, TIBC, IRON, RETICCTPCT in the last 72 hours. Urine analysis:    Component Value Date/Time   COLORURINE YELLOW 02/01/2019 1003   [place] 02/01/2019  1003   LABSPEC 1.012 02/01/2019 1003   PHURINE 7.0 02/01/2019 1003   GLUCOSEU 50 (A) 02/01/2019 1003   HGBUR SMALL (A) 02/01/2019 1003   BILIRUBINUR NEGATIVE 02/01/2019 1003   KETONESUR 5 (A) 02/01/2019 1003   PROTEINUR NEGATIVE 02/01/2019 1003   NITRITE NEGATIVE 02/01/2019 1003   [place] 02/01/2019 1003   Sepsis Labs: Invalid input(s): PROCALCITONIN, LACTICIDVEN  Recent Results (from the past 240 hour(s))  Culture, respiratory (non-expectorated)     Status: None   Collection Time: 02/02/19  4:40 PM   Specimen: Tracheal Aspirate; Respiratory  Result Value Ref Range Status   Specimen Description TRACHEAL ASPIRATE  Final   Special Requests Normal  Final   Gram Stain   Final    RARE WBC PRESENT,BOTH PMN AND MONONUCLEAR RARE GRAM POSITIVE COCCI IN PAIRS Performed at [facility], [place] [address], [place], [place] [address]    Culture MODERATE STAPHYLOCOCCUS AUREUS  Final   Report Status 02/05/2019 FINAL  Final   Organism ID, Bacteria STAPHYLOCOCCUS AUREUS  Final      Susceptibility   Staphylococcus aureus - MIC*    CIPROFLOXACIN <=0.5 SENSITIVE Sensitive     ERYTHROMYCIN <=0.25 SENSITIVE Sensitive     GENTAMICIN <=0.5 SENSITIVE Sensitive     OXACILLIN <=0.25 SENSITIVE Sensitive     TETRACYCLINE <=1 SENSITIVE Sensitive     VANCOMYCIN 1 SENSITIVE Sensitive     TRIMETH/SULFA <=10 SENSITIVE Sensitive     CLINDAMYCIN <=0.25 SENSITIVE Sensitive     RIFAMPIN <=0.5 SENSITIVE Sensitive     Inducible Clindamycin NEGATIVE Sensitive     * MODERATE STAPHYLOCOCCUS AUREUS   Respiratory Panel by PCR     Status: None   Collection Time: 02/03/19  9:33 AM   Specimen: Nasopharyngeal Swab; Respiratory  Result Value Ref Range Status   Adenovirus NOT DETECTED NOT DETECTED Final   Coronavirus 229E NOT DETECTED NOT DETECTED Final    Comment: (NOTE) The Coronavirus on the Respiratory Panel, DOES NOT test for the novel  Coronavirus (2019 nCoV)    Coronavirus HKU1 NOT DETECTED NOT DETECTED Final   Coronavirus NL63 NOT DETECTED NOT DETECTED Final   Coronavirus OC43 NOT DETECTED NOT DETECTED Final   Metapneumovirus NOT DETECTED NOT DETECTED Final   Rhinovirus / Enterovirus NOT DETECTED NOT DETECTED Final   Influenza A NOT DETECTED NOT DETECTED Final   Influenza B NOT DETECTED NOT DETECTED Final   Parainfluenza Virus 1 NOT DETECTED NOT DETECTED Final   Parainfluenza Virus 2 NOT DETECTED NOT DETECTED Final   Parainfluenza Virus 3 NOT DETECTED NOT DETECTED Final   Parainfluenza Virus 4 NOT DETECTED NOT DETECTED Final   Respiratory Syncytial Virus NOT DETECTED NOT DETECTED Final   Bordetella pertussis NOT DETECTED NOT DETECTED Final   Chlamydophila pneumoniae NOT DETECTED NOT DETECTED Final   Mycoplasma pneumoniae NOT DETECTED NOT DETECTED Final    Comment: Performed at [facility], [place]. [address], [place], [place] [address]  Body fluid culture (includes gram stain)     Status: None   Collection Time: 02/04/19  1:10 PM   Specimen: Pleural Fluid  Result Value Ref Range Status   Specimen Description PLEURAL  Final   Special Requests FLUID  Final   Gram Stain   Final    ABUNDANT WBC PRESENT,BOTH PMN AND MONONUCLEAR NO ORGANISMS SEEN    Culture   Final    NO GROWTH Performed at [facility], [address], [place], [place] [address]  Report Status 02/07/2019 FINAL  Final     Radiology Studies: Ct Abdomen Pelvis W Contrast  Addendum Date: 02/11/2019   ADDENDUM REPORT: 02/11/2019 21:22 ADDENDUM: There is mild diffuse wall thickening of the  esophagus which can be seen in patients with esophagitis. Electronically Signed   By: Constance Holster M.D.   On: 02/11/2019 21:22   Result Date: 02/11/2019 CLINICAL DATA:  Abdominal pain EXAM: CT ABDOMEN AND PELVIS WITH CONTRAST TECHNIQUE: Multidetector CT imaging of the abdomen and pelvis was performed using the standard protocol following bolus administration of intravenous contrast. CONTRAST:  173mL OMNIPAQUE IOHEXOL 300 MG/ML  SOLN COMPARISON:  CT dated May 02, 2011. FINDINGS: Lower chest: There is a small right-sided pleural effusion with adjacent compressive atelectasis. The heart size is mild-to-moderately enlarged. Hepatobiliary: There are innumerable small cystic structures throughout the liver that are too small to characterize but are statistically most likely to represent benign cysts. The patient is status post prior cholecystectomy. There is mild intrahepatic and extrahepatic biliary ductal dilatation. Again identified is a large hypoattenuating partially calcified mass in the left hepatic lobe. 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 Pancreas: The pancreatic duct is dilated. There is no discrete pancreatic mass. Spleen: Normal in size without focal abnormality. Adrenals/Urinary Tract: The right kidney is absent. The right adrenal gland is not well visualized. The left adrenal gland is unremarkable. The left kidney is unremarkable. There is a moderate amount of gas in the urinary bladder. Stomach/Bowel: There is oral contrast the level of the rectum. There is mild diffuse wall thickening of the esophagus. The stomach is grossly unremarkable. There is no evidence of a small-bowel obstruction. No CT evidence of colitis. Vascular/Lymphatic: Diffuse atherosclerotic changes are noted of the abdominal aorta. 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 This is stable from prior CT in 2012. Reproductive: Status post hysterectomy. No adnexal masses.  Other: No abdominal wall hernia or abnormality. No abdominopelvic ascites. Musculoskeletal: No fracture is seen. IMPRESSION: 1. No acute intra-abdominal abnormality detected. 2. Small partially visualized right-sided pleural effusion. 3. Status post cholecystectomy. There is intrahepatic and extrahepatic biliary ductal dilatation. There is pancreatic ductal dilatation. Correlation with laboratory studies is recommended. If there is clinical concern for an obstructing process, follow-up with outpatient MRCP or ERCP is recommended. 4. 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 This is essentially stable since 2012. Aortic Atherosclerosis (ICD10-I70.0). Electronically Signed: By: Constance Holster M.D. On: 02/11/2019 21:09    Cashawn Yanko T. [place]  If 7PM-7AM, please contact night-coverage [email or web address] Password [email or web address] 02/12/2019, 1:15 PM

## 2019-02-12 NOTE — Progress Notes (Signed)
PT Cancellation Note  Patient Details Name: [name] MRN: [id number] DOB: 19-May-1946   Cancelled Treatment:    Reason Eval/Treat Not Completed: Fatigue/lethargy limiting ability to participate;Patient declined, no reason specified  Pt refused PT session despite encouragement. Pt states she is tired and is about to go for a test and to "try back tomorrow".  PT will attempt treatment at a later time as appropriate   [name] 02/12/2019, 10:42 AM

## 2019-02-12 NOTE — Progress Notes (Signed)
Advanced Heart Failure Rounding Note   Subjective:    Feeling better. N/v improved. Had EGD to with duodenitis. Biopsies takend   No CP or SOB.   AF rates in 80-90s. Off AC for EGD  Objective:   Weight Range:  Vital Signs:   Temp:  [97.3 F (36.3 C)-99.4 F (37.4 C)] 97.3 F (36.3 C) (06/24 1159) Pulse Rate:  [68-139] 91 (06/24 1219) Resp:  [14-18] 15 (06/24 1219) BP: (65-146)/(41-97) 111/64 (06/24 1219) SpO2:  [96 %-100 %] 100 % (06/24 1219) Weight:  [52.7 kg] 52.7 kg (06/24 0414) Last BM Date: 02/11/19  Weight change: Filed Weights   02/09/19 0304 02/11/19 0500 02/12/19 0414  Weight: 51.4 kg 49.6 kg 52.7 kg    Intake/Output:   Intake/Output Summary (Last 24 hours) at 02/12/2019 1552 Last data filed at 02/12/2019 1202 Gross per 24 hour  Intake 511.94 ml  Output 0 ml  Net 511.94 ml     Physical Exam: General:  Lying in bed. No resp difficulty HEENT: normal Neck: supple. no JVD. Carotids 2+ bilat; no bruits. No lymphadenopathy or thryomegaly appreciated. Cor: PMI nondisplaced. Irregular rate & rhythm. No rubs, gallops or murmurs. Lungs: clear with degreased BS Abdomen: soft, nontender, nondistended. No hepatosplenomegaly. No bruits or masses. Good bowel sounds. Extremities: no cyanosis, clubbing, rash, edema Neuro: alert & orientedx3, cranial nerves grossly intact. moves all 4 extremities w/o difficulty. Affect pleasant    Telemetry:  AF 80-90s Personally reviewed   Labs: Basic Metabolic Panel: Recent Labs  Lab 02/08/19 0242 02/09/19 0444 02/10/19 0421 02/10/19 0954 02/10/19 1030 02/11/19 0405 02/12/19 0330  NA 140 139 137 139 136  137 134* 133*  K 3.7 3.5 4.3 4.3 4.4  3.9 4.2 4.2  CL 102 101 102  --   --  98 98  CO2 27 27 25   --   --  24 23  GLUCOSE 101* 98 95  --   --  104* 105*  BUN 12 12 10   --   --  11 9  CREATININE 0.85 0.83 0.92  --   --  0.93 0.94  CALCIUM 9.3 9.1 9.4  --   --  9.9 9.9  MG 1.9 1.6* 2.3  --   --  1.9 2.1     Liver Function Tests: Recent Labs  Lab 02/09/19 0444 02/10/19 0421 02/11/19 0405 02/12/19 0330  AST 22 21 20 19   ALT 81* 65* 52* 44  ALKPHOS 50 53 53 55  BILITOT 1.1 0.9 1.5* 1.4*  PROT 5.4* 5.4* 5.9* 6.0*  ALBUMIN 3.0* 3.2* 3.3* 3.5   Recent Labs  Lab 02/11/19 0405  LIPASE 33   No results for input(s): AMMONIA in the last 168 hours.  CBC: Recent Labs  Lab 02/08/19 0242 02/09/19 0444 02/10/19 0421 02/10/19 0954 02/10/19 1030 02/11/19 0405 02/12/19 0330  WBC 9.0 9.8 9.0  --   --  11.0* 11.7*  HGB 12.3 13.2 14.1 13.3 13.9  13.3 14.3 14.5  HCT 37.4 39.4 42.4 39.0 41.0  39.0 43.8 42.8  MCV 97.1 95.9 96.8  --   --  95.0 94.7  PLT 240 265 274  --   --  318 303    Cardiac Enzymes: No results for input(s): CKTOTAL, CKMB, CKMBINDEX, TROPONINI in the last 168 hours.  BNP: BNP (last 3 results) Recent Labs    02/09/19 0444  BNP 1,708.7*    ProBNP (last 3 results) No results for input(s): PROBNP in the last 8760 hours.  Other results:  Imaging: Ct Abdomen Pelvis W Contrast  Addendum Date: 02/11/2019   ADDENDUM REPORT: 02/11/2019 21:22 ADDENDUM: There is mild diffuse wall thickening of the esophagus which can be seen in patients with esophagitis. Electronically Signed   By: Constance Holster M.D.   On: 02/11/2019 21:22   Result Date: 02/11/2019 CLINICAL DATA:  Abdominal pain EXAM: CT ABDOMEN AND PELVIS WITH CONTRAST TECHNIQUE: Multidetector CT imaging of the abdomen and pelvis was performed using the standard protocol following bolus administration of intravenous contrast. CONTRAST:  168mL OMNIPAQUE IOHEXOL 300 MG/ML  SOLN COMPARISON:  CT dated May 02, 2011. FINDINGS: Lower chest: There is a small right-sided pleural effusion with adjacent compressive atelectasis. The heart size is mild-to-moderately enlarged. Hepatobiliary: There are innumerable small cystic structures throughout the liver that are too small to characterize but are statistically most likely  to represent benign cysts. The patient is status post prior cholecystectomy. There is mild intrahepatic and extrahepatic biliary ductal dilatation. Again identified is a large hypoattenuating partially calcified mass in the left hepatic lobe. 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 Pancreas: The pancreatic duct is dilated. There is no discrete pancreatic mass. Spleen: Normal in size without focal abnormality. Adrenals/Urinary Tract: The right kidney is absent. The right adrenal gland is not well visualized. The left adrenal gland is unremarkable. The left kidney is unremarkable. There is a moderate amount of gas in the urinary bladder. Stomach/Bowel: There is oral contrast the level of the rectum. There is mild diffuse wall thickening of the esophagus. The stomach is grossly unremarkable. There is no evidence of a small-bowel obstruction. No CT evidence of colitis. Vascular/Lymphatic: Diffuse atherosclerotic changes are noted of the abdominal aorta. 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 This is stable from prior CT in 2012. Reproductive: Status post hysterectomy. No adnexal masses. Other: No abdominal wall hernia or abnormality. No abdominopelvic ascites. Musculoskeletal: No fracture is seen. IMPRESSION: 1. No acute intra-abdominal abnormality detected. 2. Small partially visualized right-sided pleural effusion. 3. Status post cholecystectomy. There is intrahepatic and extrahepatic biliary ductal dilatation. There is pancreatic ductal dilatation. Correlation with laboratory studies is recommended. If there is clinical concern for an obstructing process, follow-up with outpatient MRCP or ERCP is recommended. 4. 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 This is essentially stable since 2012. Aortic Atherosclerosis (ICD10-I70.0). Electronically Signed: By: Constance Holster M.D. On: 02/11/2019 21:09     Medications:     Scheduled Medications: .  atorvastatin  10 mg Oral Daily  . bisoprolol  2.5 mg Oral Daily  . buPROPion  150 mg Oral Daily  . Chlorhexidine Gluconate Cloth  6 each Topical Daily  . colestipol  1 g Oral BID  . feeding supplement (ENSURE ENLIVE)  237 mL Oral TID BM  . fentaNYL  1 patch Transdermal Q72H  . FLUoxetine  20 mg Oral Daily  . insulin aspart  0-15 Units Subcutaneous Q4H  . levothyroxine  88 mcg Oral Daily  . losartan  25 mg Oral Daily  . mouth rinse  15 mL Mouth Rinse BID  . pantoprazole  40 mg Oral BID  . sodium chloride flush  3 mL Intravenous Q12H  . spironolactone  12.5 mg Oral Daily  . sucralfate  1 g Oral Q6H    Infusions: . sodium chloride Stopped (02/10/19 2000)  . sodium chloride    . heparin      PRN Medications: sodium chloride, sodium chloride, acetaminophen **OR**  acetaminophen, acetaminophen, albuterol, calcium carbonate, fluticasone, hydrALAZINE, methocarbamol, metoprolol tartrate, naLOXone (NARCAN)  injection, ondansetron (ZOFRAN) IV, ondansetron **OR** [DISCONTINUED] ondansetron (ZOFRAN) IV, polyethylene glycol, rOPINIRole, senna-docusate, sodium chloride flush   Assessment/plan :   1. Acute on chronic hypoxic respiratory failure in setting of aspiration PNA - has severe underlying COPD on CXR. Per chart quit tobacco in 2001. No PFTs on chart - admitted with severe R-sided PNA likely due to aspiration. - intubated 6/13. Extubated 6/16 - much improved after R thors 6/16 (transudate)  - She is euvolemic. EF normalized. Can change lasix to prn   2. Chronic AF with RVR - rate much improved on bisoprolol 2.5. will continue - Restart Eliquis when ok from GI perspective   3. Acute systolic HF - echo from 4/81 shows EF 20% with Tako-tsubo pattern. Suspect stress CM - repeat echo 6/22 EF 50- 55% Personally reviewed - Continue low-dose losartan, spiro  - stop digoxin and lasix. If HR increases off digoxin can restart  - Cath 6/22 normal cors   4. NSTEMI - Troponin elevated in 2-3  range. Now coming down. No CP ECG with new iRBBB and mild lateral TW changes - suspect demand ischemia in setting of severe medical illness.  - however echo from 6/14 suggestive of Tako-Tsubo EF 20%. Cath 6/22 normal cors  - repeat echo 6/22 EF 50- 55% Personally reviewed - continue statin - stop ASA with Eliquis   5. Hypokalemia/hypomag - replete.  6. Chronic GI issues and malnutrition - now with aspiration PNA. - has recurrent symptoms. GI following  Cardiology will follow from a distance. She has agreed to [facility]     Length of Stay: 10   Glori Bickers  MD 02/12/2019, 3:52 PM  Advanced Heart Failure Team Pager [phone] (M-F; [place])  Please contact [facility] for night-coverage after hours (4p -7a ) and weekends on [email or web address]

## 2019-02-12 NOTE — Op Note (Signed)
[facility] Patient Name: [name] Procedure Date : 02/12/2019 MRN: [id number] Attending MD: Carlota Raspberry. Havery Moros , MD Date of Birth: December 13, 1945 CSN: [id number] Age: 73 Admit Type: Inpatient Procedure:                Upper GI endoscopy Indications:              Nausea with vomiting, chronic diarrhea, recent                            worsening, chronic narcotic use, CT without cause                            for symptoms of nausea / vomiting, but esophageal                            thickening noted Providers:                Carlota Raspberry. Havery Moros, MD, Grace Isaac, RN,                            William Dalton, Technician Referring MD:              Medicines:                Monitored Anesthesia Care Complications:            No immediate complications. Estimated blood loss:                            Minimal. Estimated Blood Loss:     Estimated blood loss was minimal. Procedure:                Pre-Anesthesia Assessment:                           - Prior to the procedure, a History and Physical                            was performed, and patient medications and                            allergies were reviewed. The patient's tolerance of                            previous anesthesia was also reviewed. The risks                            and benefits of the procedure and the sedation                            options and risks were discussed with the patient.                            All questions were answered, and informed consent                            was  obtained. Prior Anticoagulants: The patient has                            taken heparin, last dose was day of procedure. ASA                            Grade Assessment: III - A patient with severe                            systemic disease. After reviewing the risks and                            benefits, the patient was deemed in satisfactory                            condition to undergo the  procedure.                           After obtaining informed consent, the endoscope was                            passed under direct vision. Throughout the                            procedure, the patient's blood pressure, pulse, and                            oxygen saturations were monitored continuously. The                            GIF-H190 ([id number] Olympus gastroscope was                            introduced through the mouth, and advanced to the                            second part of duodenum. The upper GI endoscopy was                            accomplished without difficulty. The patient                            tolerated the procedure well. Scope In: Scope Out: Findings:      The Z-line was regular and was found 37 cm from the incisors.      The esophagus was tortous but otherwise normal. No inflammatory changes       noted.      Striped mildly erythematous mucosa was found in the gastric antrum       without ulceration.      A single diminutive sessile polyp was found in the gastric fundus. The       polyp was removed with a cold biopsy forceps. Resection and retrieval       were complete.      The exam of the stomach was otherwise normal.  Biopsies were taken with a cold forceps in the gastric body, at the       incisura and in the gastric antrum for Helicobacter pylori testing.      Diffuse moderate inflammation characterized by erosions, erythema,       friability and shallow ulcerations was found in the duodenal bulb and in       the second portion of the duodenum. Biopsies were taken with a cold       forceps for histology.      The exam of the duodenum distally was otherwise normal. Impression:               - Z-line regular, 37 cm from the incisors.                           - Normal esophagus otherwise - nothing to account                            for thickening noted on CT scan                           - Erythematous mucosa in the antrum without                             ulceration. Normal stomach otherwise - biopsies to                            rule out H pylori                           - Benign gastric polyp, removed with forceps.                           - Moderate duodenitis with erosions and superficial                            small ulcerations. Biopsied.                           Duodenitis noted, although I think less likely to                            be driving the entirety of her symptoms given                            chronicity and prior exam findings. 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 Recommendation:           - Return patient  to hospital ward for ongoing care.                           - Advance diet as tolerated.                           - Await pathology results.                           - Continue present medications.                           - Okay to resume heparin                           - Ideally wean off chronic narcotics and switch to                            alternative pain regimen (discussed with the                            patient's family)                           - Would switch PPI to protonix 40mg  BID or Dexilant                            60mg  q day (from omeprazole 40mg  BID at home)                           - Avoid NSAIDs                           - Add Carafate 1 gm po q 6 hours PRN                           - Consideration for trial of low dose Reglan 5mg                             TID with meals                           - Consideration for trial of colestid 1gm BID or                            cholestyramine for diarrhea (post cholecystectomy)                           - Check gastrin level to ensure normal                           - Modified barium swallow given history of                             aspiration.                           -  Please call with questions, the patient can                            follow up as outpatient with Dr. Hilarie Fredrickson in our                            clinic for these chronic symptoms Procedure Code(s):        --- Professional ---                           [phone], Esophagogastroduodenoscopy, flexible,                            transoral; with biopsy, single or multiple Diagnosis Code(s):        --- Professional ---                           K31.89, Other diseases of stomach and duodenum                           K29.80, Duodenitis without bleeding                           R11.2, Nausea with vomiting, unspecified CPT copyright 2019 [employer]. All rights reserved. The codes documented in this report are preliminary and upon coder review may  be revised to meet current compliance requirements. Remo Lipps P. Deane Wattenbarger, MD 02/12/2019 12:08:15 PM This report has been signed electronically. Number of Addenda: 0

## 2019-02-12 NOTE — Progress Notes (Signed)
Inpatient Rehabilitation Admissions Coordinator   Inpatient Rehab Consult received. I met with patient at the bedside for rehabilitation assessment. We discussed goals and expectations of an inpatient rehab admission.  Patient prefers a inpt rehab prior to returning home to reach Mod I level as she was before. Noted GI workup in process. I will follow up tomorrow.  Danne Baxter, RN, MSN Rehab Admissions Coordinator [phone] 02/12/2019 10:25 AM

## 2019-02-12 NOTE — Care Management (Signed)
Benefit check sent for Dexilant and Carafate as noted to be possible DC meds from upper GI report.

## 2019-02-12 NOTE — Anesthesia Preprocedure Evaluation (Signed)
Anesthesia Evaluation  Patient identified by MRN, date of birth, ID band Patient awake    Reviewed: Allergy & Precautions, NPO status , Patient's Chart, lab work & pertinent test results  History of Anesthesia Complications Negative for: history of anesthetic complications  Airway Mallampati: II  TM Distance: >3 FB Neck ROM: Full    Dental   Pulmonary pneumonia, COPD, former smoker,    Pulmonary exam normal        Cardiovascular hypertension, +CHF  Normal cardiovascular exam+ dysrhythmias Atrial Fibrillation      Neuro/Psych CVA negative psych ROS   GI/Hepatic GERD  ,(+)     substance abuse  ,   Endo/Other  Hypothyroidism   Renal/GU negative Renal ROS  negative genitourinary   Musculoskeletal negative musculoskeletal ROS (+) narcotic dependent  Abdominal   Peds  Hematology negative hematology ROS (+)   Anesthesia Other Findings Admitted and intubated 02/01/19 for respiratory distress suspected 2/2 aspiration pneumonia. Extubated 6/16. 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 Cath on 6/22 showed normal EF and normal coronary arteries.  Reproductive/Obstetrics                            Anesthesia Physical Anesthesia Plan  ASA: III  Anesthesia Plan: MAC   Post-op Pain Management:    Induction:   PONV Risk Score and Plan: 2 and Propofol infusion and Treatment may vary due to age or medical condition  Airway Management Planned: Natural Airway and Nasal Cannula  Additional Equipment: None  Intra-op Plan:   Post-operative Plan:   Informed Consent: I have reviewed the patients History and Physical, chart, labs and discussed the procedure including the risks, benefits and alternatives for the proposed anesthesia with the patient or authorized representative who has indicated his/her understanding and acceptance.       Plan Discussed with:    Anesthesia Plan Comments:         Anesthesia Quick Evaluation

## 2019-02-13 ENCOUNTER — Encounter (HOSPITAL_COMMUNITY): Payer: Self-pay | Admitting: Nurse Practitioner

## 2019-02-13 ENCOUNTER — Encounter (HOSPITAL_COMMUNITY): Payer: Self-pay | Admitting: Gastroenterology

## 2019-02-13 ENCOUNTER — Other Ambulatory Visit: Payer: Self-pay

## 2019-02-13 ENCOUNTER — Inpatient Hospital Stay (HOSPITAL_COMMUNITY)
Admission: RE | Admit: 2019-02-13 | Discharge: 2019-02-18 | DRG: 945 | Disposition: A | Discharge status: Home Health | Payer: Medicare Other | Source: Intra-hospital | Attending: Physical Medicine & Rehabilitation | Admitting: Physical Medicine & Rehabilitation

## 2019-02-13 DIAGNOSIS — E440 Moderate protein-calorie malnutrition: Secondary | ICD-10-CM

## 2019-02-13 DIAGNOSIS — K2980 Duodenitis without bleeding: Secondary | ICD-10-CM

## 2019-02-13 DIAGNOSIS — E039 Hypothyroidism, unspecified: Secondary | ICD-10-CM | POA: Diagnosis present

## 2019-02-13 DIAGNOSIS — R5381 Other malaise: Secondary | ICD-10-CM | POA: Diagnosis not present

## 2019-02-13 DIAGNOSIS — Z79890 Hormone replacement therapy (postmenopausal): Secondary | ICD-10-CM

## 2019-02-13 DIAGNOSIS — I509 Heart failure, unspecified: Secondary | ICD-10-CM

## 2019-02-13 DIAGNOSIS — K449 Diaphragmatic hernia without obstruction or gangrene: Secondary | ICD-10-CM | POA: Diagnosis present

## 2019-02-13 DIAGNOSIS — M961 Postlaminectomy syndrome, not elsewhere classified: Secondary | ICD-10-CM | POA: Diagnosis not present

## 2019-02-13 DIAGNOSIS — Z6820 Body mass index (BMI) 20.0-20.9, adult: Secondary | ICD-10-CM

## 2019-02-13 DIAGNOSIS — E7800 Pure hypercholesterolemia, unspecified: Secondary | ICD-10-CM | POA: Diagnosis present

## 2019-02-13 DIAGNOSIS — M1711 Unilateral primary osteoarthritis, right knee: Secondary | ICD-10-CM

## 2019-02-13 DIAGNOSIS — Z880 Allergy status to penicillin: Secondary | ICD-10-CM | POA: Diagnosis not present

## 2019-02-13 DIAGNOSIS — R197 Diarrhea, unspecified: Secondary | ICD-10-CM | POA: Diagnosis present

## 2019-02-13 DIAGNOSIS — J449 Chronic obstructive pulmonary disease, unspecified: Secondary | ICD-10-CM | POA: Diagnosis present

## 2019-02-13 DIAGNOSIS — M1990 Unspecified osteoarthritis, unspecified site: Secondary | ICD-10-CM | POA: Diagnosis present

## 2019-02-13 DIAGNOSIS — M810 Age-related osteoporosis without current pathological fracture: Secondary | ICD-10-CM | POA: Diagnosis present

## 2019-02-13 DIAGNOSIS — Z981 Arthrodesis status: Secondary | ICD-10-CM | POA: Diagnosis not present

## 2019-02-13 DIAGNOSIS — K219 Gastro-esophageal reflux disease without esophagitis: Secondary | ICD-10-CM | POA: Diagnosis present

## 2019-02-13 DIAGNOSIS — Z8249 Family history of ischemic heart disease and other diseases of the circulatory system: Secondary | ICD-10-CM | POA: Diagnosis not present

## 2019-02-13 DIAGNOSIS — H5340 Unspecified visual field defects: Secondary | ICD-10-CM | POA: Diagnosis present

## 2019-02-13 DIAGNOSIS — E876 Hypokalemia: Secondary | ICD-10-CM | POA: Diagnosis present

## 2019-02-13 DIAGNOSIS — I129 Hypertensive chronic kidney disease with stage 1 through stage 4 chronic kidney disease, or unspecified chronic kidney disease: Secondary | ICD-10-CM | POA: Diagnosis present

## 2019-02-13 DIAGNOSIS — J690 Pneumonitis due to inhalation of food and vomit: Principal | ICD-10-CM

## 2019-02-13 DIAGNOSIS — Z87891 Personal history of nicotine dependence: Secondary | ICD-10-CM | POA: Diagnosis not present

## 2019-02-13 DIAGNOSIS — E441 Mild protein-calorie malnutrition: Secondary | ICD-10-CM | POA: Diagnosis present

## 2019-02-13 DIAGNOSIS — Z96641 Presence of right artificial hip joint: Secondary | ICD-10-CM | POA: Diagnosis present

## 2019-02-13 DIAGNOSIS — N189 Chronic kidney disease, unspecified: Secondary | ICD-10-CM | POA: Diagnosis present

## 2019-02-13 DIAGNOSIS — G894 Chronic pain syndrome: Secondary | ICD-10-CM

## 2019-02-13 DIAGNOSIS — Z7901 Long term (current) use of anticoagulants: Secondary | ICD-10-CM | POA: Diagnosis not present

## 2019-02-13 DIAGNOSIS — I214 Non-ST elevation (NSTEMI) myocardial infarction: Secondary | ICD-10-CM | POA: Diagnosis present

## 2019-02-13 DIAGNOSIS — K529 Noninfective gastroenteritis and colitis, unspecified: Secondary | ICD-10-CM | POA: Diagnosis present

## 2019-02-13 DIAGNOSIS — I5021 Acute systolic (congestive) heart failure: Secondary | ICD-10-CM | POA: Diagnosis not present

## 2019-02-13 DIAGNOSIS — Z7951 Long term (current) use of inhaled steroids: Secondary | ICD-10-CM

## 2019-02-13 DIAGNOSIS — I69398 Other sequelae of cerebral infarction: Secondary | ICD-10-CM | POA: Diagnosis not present

## 2019-02-13 DIAGNOSIS — I4819 Other persistent atrial fibrillation: Secondary | ICD-10-CM | POA: Diagnosis present

## 2019-02-13 LAB — GLUCOSE, CAPILLARY
Glucose-Capillary: 93 mg/dL (ref 70–99)
Glucose-Capillary: 95 mg/dL (ref 70–99)
Glucose-Capillary: 153 mg/dL — ABNORMAL HIGH (ref 70–99)

## 2019-02-13 LAB — CBC
HCT: 40.6 % (ref 36.0–46.0)
Hemoglobin: 13.7 g/dL (ref 12.0–15.0)
MCH: 32 pg (ref 26.0–34.0)
MCHC: 33.7 g/dL (ref 30.0–36.0)
MCV: 94.9 fL (ref 80.0–100.0)
Platelets: 285 10*3/uL (ref 150–400)
RBC: 4.28 MIL/uL (ref 3.87–5.11)
RDW: 12.8 % (ref 11.5–15.5)
WBC: 7 10*3/uL (ref 4.0–10.5)
nRBC: 0 % (ref 0.0–0.2)

## 2019-02-13 LAB — HEPARIN LEVEL (UNFRACTIONATED): Heparin Unfractionated: 0.37 IU/mL (ref 0.30–0.70)

## 2019-02-13 LAB — MAGNESIUM: Magnesium: 2 mg/dL (ref 1.7–2.4)

## 2019-02-13 MED ORDER — POLYETHYLENE GLYCOL 3350 17 G PO PACK: 17.0000 g | PACK | Freq: Every day | ORAL | 0 refills | Status: DC | PRN

## 2019-02-13 MED ORDER — METHOCARBAMOL 500 MG PO TABS
500.0000 mg | ORAL_TABLET | Freq: Two times a day (BID) | ORAL | Status: DC | PRN
  Administered 2019-02-17: 500 mg via ORAL
  Filled 2019-02-13: qty 1

## 2019-02-13 MED ORDER — ORAL CARE MOUTH RINSE
15.0000 mL | Freq: Two times a day (BID) | OROMUCOSAL | Status: DC
  Administered 2019-02-13 – 2019-02-16 (×5): 15 mL via OROMUCOSAL

## 2019-02-13 MED ORDER — ALUM & MAG HYDROXIDE-SIMETH 200-200-20 MG/5ML PO SUSP
30.0000 mL | ORAL | Status: DC | PRN
  Administered 2019-02-14: 30 mL via ORAL
  Filled 2019-02-13: qty 30

## 2019-02-13 MED ORDER — ALBUTEROL SULFATE (2.5 MG/3ML) 0.083% IN NEBU: 2.5000 mg | INHALATION_SOLUTION | RESPIRATORY_TRACT | Status: DC | PRN

## 2019-02-13 MED ORDER — FENTANYL 25 MCG/HR TD PT72
1.0000 | MEDICATED_PATCH | TRANSDERMAL | Status: DC
  Administered 2019-02-13: 1 via TRANSDERMAL
  Filled 2019-02-13: qty 1

## 2019-02-13 MED ORDER — CHLORHEXIDINE GLUCONATE CLOTH 2 % EX PADS
6.0000 | MEDICATED_PAD | Freq: Every day | CUTANEOUS | Status: DC
  Administered 2019-02-14: 6 via TOPICAL

## 2019-02-13 MED ORDER — NALOXONE HCL 0.4 MG/ML IJ SOLN: 0.4000 mg | INTRAMUSCULAR | Status: DC | PRN

## 2019-02-13 MED ORDER — SPIRONOLACTONE 25 MG PO TABS: 12.5000 mg | ORAL_TABLET | Freq: Every day | ORAL | 1 refills | Status: DC

## 2019-02-13 MED ORDER — FLEET ENEMA 7-19 GM/118ML RE ENEM: 1.0000 | ENEMA | Freq: Once | RECTAL | Status: DC | PRN

## 2019-02-13 MED ORDER — BISOPROLOL FUMARATE 5 MG PO TABS
2.5000 mg | ORAL_TABLET | Freq: Every day | ORAL | Status: DC
  Administered 2019-02-14 – 2019-02-18 (×5): 2.5 mg via ORAL
  Filled 2019-02-13 (×5): qty 0.5

## 2019-02-13 MED ORDER — LOSARTAN POTASSIUM 25 MG PO TABS: 25.0000 mg | ORAL_TABLET | Freq: Every day | ORAL | 1 refills | Status: DC

## 2019-02-13 MED ORDER — POLYETHYLENE GLYCOL 3350 17 G PO PACK: 17.0000 g | PACK | Freq: Every day | ORAL | Status: DC | PRN

## 2019-02-13 MED ORDER — ATORVASTATIN CALCIUM 10 MG PO TABS
10.0000 mg | ORAL_TABLET | Freq: Every day | ORAL | Status: DC
  Administered 2019-02-13 – 2019-02-17 (×5): 10 mg via ORAL
  Filled 2019-02-13 (×5): qty 1

## 2019-02-13 MED ORDER — METOPROLOL TARTRATE 5 MG/5ML IV SOLN
2.5000 mg | Freq: Four times a day (QID) | INTRAVENOUS | Status: DC | PRN
  Filled 2019-02-13: qty 5

## 2019-02-13 MED ORDER — FLUOXETINE HCL 20 MG PO CAPS
20.0000 mg | ORAL_CAPSULE | Freq: Every day | ORAL | Status: DC
  Administered 2019-02-14 – 2019-02-18 (×5): 20 mg via ORAL
  Filled 2019-02-13 (×5): qty 1

## 2019-02-13 MED ORDER — ENSURE ENLIVE PO LIQD
237.0000 mL | Freq: Three times a day (TID) | ORAL | Status: DC
  Administered 2019-02-13: 237 mL via ORAL

## 2019-02-13 MED ORDER — ACETAMINOPHEN 325 MG PO TABS
650.0000 mg | ORAL_TABLET | Freq: Three times a day (TID) | ORAL | Status: DC
  Administered 2019-02-13 – 2019-02-18 (×13): 650 mg via ORAL
  Filled 2019-02-13 (×14): qty 2

## 2019-02-13 MED ORDER — APIXABAN 5 MG PO TABS
5.0000 mg | ORAL_TABLET | Freq: Two times a day (BID) | ORAL | Status: DC
  Administered 2019-02-13: 5 mg via ORAL
  Filled 2019-02-13: qty 1

## 2019-02-13 MED ORDER — GUAIFENESIN-DM 100-10 MG/5ML PO SYRP: 5.0000 mL | ORAL_SOLUTION | Freq: Four times a day (QID) | ORAL | Status: DC | PRN

## 2019-02-13 MED ORDER — ONDANSETRON HCL 4 MG/2ML IJ SOLN: 4.0000 mg | Freq: Four times a day (QID) | INTRAMUSCULAR | Status: DC | PRN

## 2019-02-13 MED ORDER — PANTOPRAZOLE SODIUM 40 MG PO TBEC
40.0000 mg | DELAYED_RELEASE_TABLET | Freq: Two times a day (BID) | ORAL | Status: DC
  Administered 2019-02-13 – 2019-02-18 (×10): 40 mg via ORAL
  Filled 2019-02-13 (×10): qty 1

## 2019-02-13 MED ORDER — FLUTICASONE PROPIONATE 50 MCG/ACT NA SUSP
1.0000 | Freq: Every day | NASAL | Status: DC | PRN
  Filled 2019-02-13: qty 16

## 2019-02-13 MED ORDER — APIXABAN 5 MG PO TABS
5.0000 mg | ORAL_TABLET | Freq: Two times a day (BID) | ORAL | Status: DC
  Administered 2019-02-13 – 2019-02-18 (×10): 5 mg via ORAL
  Filled 2019-02-13 (×8): qty 1
  Filled 2019-02-13: qty 2
  Filled 2019-02-13: qty 1

## 2019-02-13 MED ORDER — DIPHENHYDRAMINE HCL 12.5 MG/5ML PO ELIX: 12.5000 mg | ORAL_SOLUTION | Freq: Four times a day (QID) | ORAL | Status: DC | PRN

## 2019-02-13 MED ORDER — ACETAMINOPHEN 325 MG PO TABS: 325.0000 mg | ORAL_TABLET | ORAL | Status: DC | PRN

## 2019-02-13 MED ORDER — BISACODYL 10 MG RE SUPP: 10.0000 mg | Freq: Every day | RECTAL | Status: DC | PRN

## 2019-02-13 MED ORDER — PANTOPRAZOLE SODIUM 40 MG PO TBEC: 40.0000 mg | DELAYED_RELEASE_TABLET | Freq: Two times a day (BID) | ORAL | 0 refills | Status: DC

## 2019-02-13 MED ORDER — FENTANYL 25 MCG/HR TD PT72: 1.0000 | MEDICATED_PATCH | TRANSDERMAL | 0 refills | Status: DC

## 2019-02-13 MED ORDER — ROPINIROLE HCL 1 MG PO TABS
1.0000 mg | ORAL_TABLET | Freq: Every evening | ORAL | Status: DC | PRN
  Administered 2019-02-13 – 2019-02-15 (×3): 1 mg via ORAL
  Filled 2019-02-13 (×5): qty 1

## 2019-02-13 MED ORDER — BUPROPION HCL ER (XL) 150 MG PO TB24
150.0000 mg | ORAL_TABLET | Freq: Every day | ORAL | Status: DC
  Administered 2019-02-14 – 2019-02-18 (×5): 150 mg via ORAL
  Filled 2019-02-13 (×5): qty 1

## 2019-02-13 MED ORDER — DICLOFENAC SODIUM 1 % TD GEL
2.0000 g | Freq: Four times a day (QID) | TRANSDERMAL | Status: DC
  Administered 2019-02-13 – 2019-02-18 (×18): 2 g via TOPICAL
  Filled 2019-02-13 (×2): qty 100

## 2019-02-13 MED ORDER — COLESTIPOL HCL 1 G PO TABS
1.0000 g | ORAL_TABLET | Freq: Two times a day (BID) | ORAL | Status: DC
  Administered 2019-02-13 – 2019-02-18 (×10): 1 g via ORAL
  Filled 2019-02-13 (×12): qty 1

## 2019-02-13 MED ORDER — LOSARTAN POTASSIUM 50 MG PO TABS
25.0000 mg | ORAL_TABLET | Freq: Every day | ORAL | Status: DC
  Administered 2019-02-14 – 2019-02-18 (×5): 25 mg via ORAL
  Filled 2019-02-13 (×5): qty 1

## 2019-02-13 MED ORDER — SENNOSIDES-DOCUSATE SODIUM 8.6-50 MG PO TABS: 2.0000 | ORAL_TABLET | Freq: Every evening | ORAL | Status: DC | PRN

## 2019-02-13 MED ORDER — TRAZODONE HCL 50 MG PO TABS: 25.0000 mg | ORAL_TABLET | Freq: Every evening | ORAL | Status: DC | PRN

## 2019-02-13 MED ORDER — SUCRALFATE 1 G PO TABS
1.0000 g | ORAL_TABLET | Freq: Three times a day (TID) | ORAL | Status: DC
  Administered 2019-02-13 – 2019-02-18 (×19): 1 g via ORAL
  Filled 2019-02-13 (×23): qty 1

## 2019-02-13 MED ORDER — COLESTIPOL HCL 1 G PO TABS: 1.0000 g | ORAL_TABLET | Freq: Two times a day (BID) | ORAL | 0 refills | Status: DC

## 2019-02-13 MED ORDER — SPIRONOLACTONE 12.5 MG HALF TABLET
12.5000 mg | ORAL_TABLET | Freq: Every day | ORAL | Status: DC
  Administered 2019-02-14 – 2019-02-18 (×5): 12.5 mg via ORAL
  Filled 2019-02-13 (×5): qty 1

## 2019-02-13 MED ORDER — ENSURE ENLIVE PO LIQD: 237.0000 mL | Freq: Three times a day (TID) | ORAL | 12 refills | Status: DC

## 2019-02-13 MED ORDER — FENTANYL 25 MCG/HR TD PT72: 1.0000 | MEDICATED_PATCH | TRANSDERMAL | Status: DC

## 2019-02-13 MED ORDER — BISOPROLOL FUMARATE 5 MG PO TABS: 2.5000 mg | ORAL_TABLET | Freq: Every day | ORAL | 0 refills | Status: DC

## 2019-02-13 MED ORDER — LEVOTHYROXINE SODIUM 88 MCG PO TABS
88.0000 ug | ORAL_TABLET | Freq: Every day | ORAL | Status: DC
  Administered 2019-02-14 – 2019-02-18 (×5): 88 ug via ORAL
  Filled 2019-02-13 (×5): qty 1

## 2019-02-13 NOTE — Progress Notes (Signed)
Advanced Heart Failure Rounding Note   Subjective:    Feels better. No n/v. Volume status ok.   Objective:   Weight Range:  Vital Signs:   Temp:  [97.3 F (36.3 C)-99.4 F (37.4 C)] 98.1 F (36.7 C) (06/25 0339) Pulse Rate:  [77-92] 80 (06/24 1938) Resp:  [15-18] 16 (06/25 0339) BP: (65-146)/(41-80) 102/66 (06/25 0339) SpO2:  [97 %-100 %] 98 % (06/25 0339) Weight:  [49.3 kg] 49.3 kg (06/25 0339) Last BM Date: 02/12/19  Weight change: Filed Weights   02/11/19 0500 02/12/19 0414 02/13/19 0339  Weight: 49.6 kg 52.7 kg 49.3 kg    Intake/Output:   Intake/Output Summary (Last 24 hours) at 02/13/2019 0917 Last data filed at 02/13/2019 0636 Gross per 24 hour  Intake 670 ml  Output 0 ml  Net 670 ml     Physical Exam: General:  Well appearing. No resp difficulty HEENT: normal Neck: supple. no JVD. Carotids 2+ bilat; no bruits. No lymphadenopathy or thryomegaly appreciated. Cor: PMI nondisplaced. irregular rate & rhythm. No rubs, gallops or murmurs. Lungs: clear Abdomen: soft, nontender, nondistended. No hepatosplenomegaly. No bruits or masses. Good bowel sounds. Extremities: no cyanosis, clubbing, rash, edema Neuro: alert & orientedx3, cranial nerves grossly intact. moves all 4 extremities w/o difficulty. Affect pleasant    Telemetry:  AF 80-90s Personally reviewed   Labs: Basic Metabolic Panel: Recent Labs  Lab 02/08/19 0242 02/09/19 0444 02/10/19 0421 02/10/19 0954 02/10/19 1030 02/11/19 0405 02/12/19 0330 02/13/19 0412  NA 140 139 137 139 136  137 134* 133*  --   K 3.7 3.5 4.3 4.3 4.4  3.9 4.2 4.2  --   CL 102 101 102  --   --  98 98  --   CO2 27 27 25   --   --  24 23  --   GLUCOSE 101* 98 95  --   --  104* 105*  --   BUN 12 12 10   --   --  11 9  --   CREATININE 0.85 0.83 0.92  --   --  0.93 0.94  --   CALCIUM 9.3 9.1 9.4  --   --  9.9 9.9  --   MG 1.9 1.6* 2.3  --   --  1.9 2.1 2.0    Liver Function Tests: Recent Labs  Lab 02/09/19 0444  02/10/19 0421 02/11/19 0405 02/12/19 0330  AST 22 21 20 19   ALT 81* 65* 52* 44  ALKPHOS 50 53 53 55  BILITOT 1.1 0.9 1.5* 1.4*  PROT 5.4* 5.4* 5.9* 6.0*  ALBUMIN 3.0* 3.2* 3.3* 3.5   Recent Labs  Lab 02/11/19 0405  LIPASE 33   No results for input(s): AMMONIA in the last 168 hours.  CBC: Recent Labs  Lab 02/09/19 0444 02/10/19 0421 02/10/19 0954 02/10/19 1030 02/11/19 0405 02/12/19 0330 02/13/19 0412  WBC 9.8 9.0  --   --  11.0* 11.7* 7.0  HGB 13.2 14.1 13.3 13.9  13.3 14.3 14.5 13.7  HCT 39.4 42.4 39.0 41.0  39.0 43.8 42.8 40.6  MCV 95.9 96.8  --   --  95.0 94.7 94.9  PLT 265 274  --   --  318 303 285    Cardiac Enzymes: No results for input(s): CKTOTAL, CKMB, CKMBINDEX, TROPONINI in the last 168 hours.  BNP: BNP (last 3 results) Recent Labs    02/09/19 0444  BNP 1,708.7*    ProBNP (last 3 results) No results for input(s): PROBNP in  the last 8760 hours.    Other results:  Imaging: Ct Abdomen Pelvis W Contrast  Addendum Date: 02/11/2019   ADDENDUM REPORT: 02/11/2019 21:22 ADDENDUM: There is mild diffuse wall thickening of the esophagus which can be seen in patients with esophagitis. Electronically Signed   By: Constance Holster M.D.   On: 02/11/2019 21:22   Result Date: 02/11/2019 CLINICAL DATA:  Abdominal pain EXAM: CT ABDOMEN AND PELVIS WITH CONTRAST TECHNIQUE: Multidetector CT imaging of the abdomen and pelvis was performed using the standard protocol following bolus administration of intravenous contrast. CONTRAST:  156mL OMNIPAQUE IOHEXOL 300 MG/ML  SOLN COMPARISON:  CT dated May 02, 2011. FINDINGS: Lower chest: There is a small right-sided pleural effusion with adjacent compressive atelectasis. The heart size is mild-to-moderately enlarged. Hepatobiliary: There are innumerable small cystic structures throughout the liver that are too small to characterize but are statistically most likely to represent benign cysts. The patient is status post  prior cholecystectomy. There is mild intrahepatic and extrahepatic biliary ductal dilatation. Again identified is a large hypoattenuating partially calcified mass in the left hepatic lobe. 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 Pancreas: The pancreatic duct is dilated. There is no discrete pancreatic mass. Spleen: Normal in size without focal abnormality. Adrenals/Urinary Tract: The right kidney is absent. The right adrenal gland is not well visualized. The left adrenal gland is unremarkable. The left kidney is unremarkable. There is a moderate amount of gas in the urinary bladder. Stomach/Bowel: There is oral contrast the level of the rectum. There is mild diffuse wall thickening of the esophagus. The stomach is grossly unremarkable. There is no evidence of a small-bowel obstruction. No CT evidence of colitis. Vascular/Lymphatic: Diffuse atherosclerotic changes are noted of the abdominal aorta. 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 This is stable from prior CT in 2012. Reproductive: Status post hysterectomy. No adnexal masses. Other: No abdominal wall hernia or abnormality. No abdominopelvic ascites. Musculoskeletal: No fracture is seen. IMPRESSION: 1. No acute intra-abdominal abnormality detected. 2. Small partially visualized right-sided pleural effusion. 3. Status post cholecystectomy. There is intrahepatic and extrahepatic biliary ductal dilatation. There is pancreatic ductal dilatation. Correlation with laboratory studies is recommended. If there is clinical concern for an obstructing process, follow-up with outpatient MRCP or ERCP is recommended. 4. 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 This is essentially stable since 2012. Aortic Atherosclerosis (ICD10-I70.0). Electronically Signed: By: Constance Holster M.D. On: 02/11/2019 21:09     Medications:     Scheduled Medications: . acetaminophen  650 mg Oral Q8H  . apixaban  5 mg Oral BID   . atorvastatin  10 mg Oral Daily  . bisoprolol  2.5 mg Oral Daily  . buPROPion  150 mg Oral Daily  . Chlorhexidine Gluconate Cloth  6 each Topical Daily  . colestipol  1 g Oral BID  . feeding supplement (ENSURE ENLIVE)  237 mL Oral TID BM  . fentaNYL  1 patch Transdermal Q72H  . FLUoxetine  20 mg Oral Daily  . insulin aspart  0-15 Units Subcutaneous Q4H  . levothyroxine  88 mcg Oral Daily  . losartan  25 mg Oral Daily  . mouth rinse  15 mL Mouth Rinse BID  . pantoprazole  40 mg Oral BID  . sodium chloride flush  3 mL Intravenous Q12H  . spironolactone  12.5 mg Oral Daily  . sucralfate  1 g Oral Q6H    Infusions: . sodium chloride Stopped (02/10/19  2000)  . sodium chloride      PRN Medications: sodium chloride, sodium chloride, albuterol, fluticasone, hydrALAZINE, methocarbamol, metoprolol tartrate, naLOXone (NARCAN)  injection, ondansetron (ZOFRAN) IV, ondansetron **OR** [DISCONTINUED] ondansetron (ZOFRAN) IV, polyethylene glycol, rOPINIRole, senna-docusate, sodium chloride flush   Assessment/plan :   1. Acute on chronic hypoxic respiratory failure in setting of aspiration PNA - has severe underlying COPD on CXR. Per chart quit tobacco in 2001. No PFTs on chart - admitted with severe R-sided PNA likely due to aspiration. - intubated 6/13. Extubated 6/16 - much improved after R thora 6/16 (transudate)  - She is euvolemic. EF normalized.  Lasix cahnged to prn   2. Chronic AF with RVR - rate much improved on bisoprolol 2.5. will continue - Eliquis restarted   3. Acute systolic HF - echo from 9/83 shows EF 20% with Tako-tsubo pattern. Suspect stress CM - repeat echo 6/22 EF 50- 55% Personally reviewed - Continue low-dose losartan, spiro  - stop digoxin and lasix. If HR increases off digoxin can restart  - Cath 6/22 normal cors   4. NSTEMI - Troponin elevated in 2-3 range. Now coming down. No CP ECG with new iRBBB and mild lateral TW changes - suspect demand ischemia in  setting of severe medical illness.  - however echo from 6/14 suggestive of Tako-Tsubo EF 20%. Cath 6/22 normal cors  - repeat echo 6/22 EF 50- 55% Personally reviewed - continue statin - stop ASA with Eliquis   5. Hypokalemia/hypomag - replete.  6. Chronic GI issues and malnutrition - now with aspiration PNA. - has recurrent symptoms. GI following  She is going to CIR today  Cardiology meds for d/c.   Apixaban 5 bid Bisoprolol 2.5 daily  Losartan 25 daily  Spiro 12.5 daiy  BMET 1 week      Length of Stay: 11   Katie Ozga  MD 02/13/2019, 9:17 AM  Advanced Heart Failure Team Pager [phone] (M-F; 7a - 4p)  Please contact [facility] for night-coverage after hours (4p -7a ) and weekends on [email or web address]

## 2019-02-13 NOTE — Progress Notes (Signed)
Pt transferred to 4W-18 in bed with all belongings. Pt daughter Steva Ready was notified and given pt new room number.

## 2019-02-13 NOTE — Progress Notes (Signed)
PROGRESS NOTE  ANGELETTE GANUS ZOX:[id number] DOB: 03-Jan-1946 DOA: 02/01/2019 PCP: Prince Solian, MD   LOS: 11 days   Patient is from: Home.  Uses walker for ambulation at baseline.  Brief Narrative / Interim history: 73 year old with history of essential hypertension, hyperlipidemia, paroxysmal A. fib on Eliquis, restless leg syndrome, hypothyroidism, CVA, depression, COPD initially presented with weakness and right knee pain.  Found to have significant electrolyte abnormality and diarrhea developed significant hypoxia.  Diagnosed with staph aureus pneumonia and right-sided transudative pleural effusion requiring thoracentesis 6/16.  Intubated from 6/13-6/16, central line placed 6/13.  Initially on vancomycin and cefepime later switched to Rocephin.  Also diagnosed with systolic CHF with ejection fraction 20%/Takotsubo pattern, heart failure team consulted. Limited echo on 6/22 with EF of 55%.  R/LHC with normal coronary arteries, LV function and low filling pressures.   Patient had return of any nausea/vomiting/abdominal pain and diarrhea the night of 6/22-6/23.  CT abdomen and pelvis on 6/23 without significant finding to explain patient's GI symptoms.  Gastroenterology consulted. Patient had an EGD on 02/12/2019 that showed normal esophagus,, erythematous mucosa in the antrum without ulceration (biopsied for H. pylori), benign gastric polyp that was resected, moderate duodenitis with erosions and superficial small ulceration (biopsied).  Assessment & Plan: Recurrent nausea/vomiting/diarrhea/abdominal pain: Chronic issue.  Was evaluated by GI, Dr. Hilarie Fredrickson in the past but had no explanation.  Had mild LFT which is downtrending.  Has had aspiration pneumonia requiring intubation this admission.  Abdominal exam is benign today.  She has no fever or significant leukocytosis.  Lipase within normal range.   -CT abdomen and pelvis on 6/23 without significant finding. -EGD on 6/24 as above. -Appreciate  GI recommendations  -Twice daily Protonix or Dexilant, Carafate  -Consider Reglan, Colestid or cholestyramine  -Gastrin level, MBS and outpatient follow-up with Dr. Hilarie Fredrickson -Patient initially agreed to stopping fentanyl while with family member but changed her mind later on.  Acute on chronic hypoxemic respiratory failure-multifactorial including aspiration/MSSA pneumonia, right-sided pleural effusion and acute systolic CHF.  Patient is currently on room air. -ETT 6/13-6/16 in the setting of encephalopathy.  -Treat treatable causes as below.  Acute on chronic systolic CHF: Initially concerned about Takotsubo CM.  Echo on 6/14 with EF of 20%.  Repeat limited echo on 6/22 with EF of 55%.  R/LHC on 6/22 not impressive.  Patient appears euvolemic.  Respiratory failure resolved.  Intake output was not captured well in epic. -Cardiology managing:  -Recommended discharge on bisoprolol, losartan, Aldactone and Eliquis -Daily weight, intake output and renal function -Closely monitor electrolytes and replenish aggressively.  Right-sided pleural effusion: thoracocentesis on 6/16 with removal of 750 cc culture negative transudative fluid.  Suspect this to be due to CHF. -Manage CHF as above  Aspiration pneumonia: Concern about this based on chest x-ray which showed right lung opacity which could also be due to pleural effusion.  Respiratory culture with moderate staph aureus.  Blood cultures negative.  Procalcitonin negative.  -Ceftriaxone 6/13-6/15, 6/17- 6/18 -Cefepime 6/15-6/16 -Cefdinir 6/18-6/22  Chronic atrial fibrillation with intermittent RVR: Stable.  On Cardizem and Eliquis at home -Continue bisoprolol and Eliquis  History of right nephrectomy -Avoid nephrotoxic meds  Moderate protein calorie malnutrition: Likely due to acute illness.  BMI 18.8 -Appreciate dietitian input-dysphagia diet, supplements  Acute metabolic encephalopathy: Likely due to acute illness and possible delirium.   Resolved.  Anxiety/depression: Stable -Continue home Wellbutrin and Prozac although this is not a good choice for anxiety.  Hypothyroidism: Stable. -Continue home  Synthroid  Generalized weakness/debility -PT/OT-CIR.  Scheduled Meds: . acetaminophen  650 mg Oral Q8H  . apixaban  5 mg Oral BID  . atorvastatin  10 mg Oral Daily  . bisoprolol  2.5 mg Oral Daily  . buPROPion  150 mg Oral Daily  . Chlorhexidine Gluconate Cloth  6 each Topical Daily  . colestipol  1 g Oral BID  . feeding supplement (ENSURE ENLIVE)  237 mL Oral TID BM  . fentaNYL  1 patch Transdermal Q72H  . FLUoxetine  20 mg Oral Daily  . insulin aspart  0-15 Units Subcutaneous Q4H  . levothyroxine  88 mcg Oral Daily  . losartan  25 mg Oral Daily  . mouth rinse  15 mL Mouth Rinse BID  . pantoprazole  40 mg Oral BID  . sodium chloride flush  3 mL Intravenous Q12H  . spironolactone  12.5 mg Oral Daily  . sucralfate  1 g Oral Q6H   Continuous Infusions: . sodium chloride Stopped (02/10/19 2000)  . sodium chloride     PRN Meds:.sodium chloride, sodium chloride, albuterol, fluticasone, hydrALAZINE, methocarbamol, metoprolol tartrate, naLOXone (NARCAN)  injection, ondansetron (ZOFRAN) IV, ondansetron **OR** [DISCONTINUED] ondansetron (ZOFRAN) IV, polyethylene glycol, rOPINIRole, senna-docusate, sodium chloride flush   DVT prophylaxis: On Eliquis Code Status: Full code Family Communication: Updated Dr. Cyndia Bent at bedside. Disposition Plan: Remains inpatient pending bed availability and MBS.   Subjective: No major events overnight of this morning.  She had no further emesis, diarrhea or abdominal pain.  She felt nauseous overnight.  Nausea resolved with IV Zofran.  Denies chest pain, dyspnea or palpitation.  Family, Dr. Vanetta Shawl at bedside.  Discussed about CT abdomen and pelvis and the plan going forward.  Patient in agreement on discontinuing fentanyl patch to see if a GI symptoms are related to this.  She is only on  25 MCG at this time.   Objective: Vitals:   02/12/19 1209 02/12/19 1219 02/12/19 1938 02/13/19 0339  BP: 110/75 111/64 121/80 102/66  Pulse: 77 91 80   Resp: 18 15 16 16   Temp:   98 F (36.7 C) 98.1 F (36.7 C)  TempSrc:   Oral Oral  SpO2: 100% 100% 98% 98%  Weight:    49.3 kg  Height:        Intake/Output Summary (Last 24 hours) at 02/13/2019 1210 Last data filed at 02/13/2019 0636 Gross per 24 hour  Intake 330 ml  Output -  Net 330 ml   Filed Weights   02/11/19 0500 02/12/19 0414 02/13/19 0339  Weight: 49.6 kg 52.7 kg 49.3 kg    Examination: GENERAL: No acute distress.  Appears well.  HEENT: MMM.  Vision and hearing grossly intact.  NECK: Supple.  No apparent JVD. LUNGS:  No IWOB. Good air movement bilaterally. HEART:  RRR. Heart sounds normal.  ABD: Bowel sounds present. Soft. Non tender.  MSK/EXT:  Moves all extremities. No apparent deformity. No edema bilaterally.  SKIN: no apparent skin lesion or wound NEURO: Awake, alert and oriented appropriately.  No gross deficit.  PSYCH: Calm. Normal affect.   Consultants:   Cardiology  Procedures:   ETT 6/13-6/16  Thoracocentesis 6/16  R/LHC on 6/22-no significant CAD  Respiratory culture MSSA  Pleural fluid culture negative  Microbiology: . COVID-19 negative . Blood cultures negative . RVP negative  Antimicrobials: Anti-infectives (From admission, onward)   Start     Dose/Rate Route Frequency Ordered Stop   02/06/19 1045  cefdinir (OMNICEF) capsule 300 mg  Status:  Discontinued     300 mg Oral Every 12 hours 02/06/19 1012 02/10/19 1537   02/05/19 0900  cefTRIAXone (ROCEPHIN) 2 g in sodium chloride 0.9 % 100 mL IVPB  Status:  Discontinued     2 g 200 mL/hr over 30 Minutes Intravenous Every 24 hours 02/05/19 0856 02/06/19 1013   02/04/19 1000  vancomycin (VANCOCIN) 1,250 mg in sodium chloride 0.9 % 250 mL IVPB  Status:  Discontinued     1,250 mg 166.7 mL/hr over 90 Minutes Intravenous Every 36 hours  02/04/19 0930 02/05/19 0856   02/03/19 1000  ceFEPIme (MAXIPIME) 2 g in sodium chloride 0.9 % 100 mL IVPB  Status:  Discontinued     2 g 200 mL/hr over 30 Minutes Intravenous Every 12 hours 02/03/19 0939 02/04/19 1017   02/02/19 1000  cefTRIAXone (ROCEPHIN) 2 g in sodium chloride 0.9 % 100 mL IVPB  Status:  Discontinued     2 g 200 mL/hr over 30 Minutes Intravenous Every 24 hours 02/02/19 0958 02/03/19 0939   02/01/19 1800  cefTRIAXone (ROCEPHIN) 1 g in sodium chloride 0.9 % 100 mL IVPB  Status:  Discontinued     1 g 200 mL/hr over 30 Minutes Intravenous Every 24 hours 02/01/19 1647 02/02/19 0958      Data Reviewed: I have independently reviewed following labs and imaging studies   CBC: Recent Labs  Lab 02/09/19 0444 02/10/19 0421 02/10/19 0954 02/10/19 1030 02/11/19 0405 02/12/19 0330 02/13/19 0412  WBC 9.8 9.0  --   --  11.0* 11.7* 7.0  HGB 13.2 14.1 13.3 13.9  13.3 14.3 14.5 13.7  HCT 39.4 42.4 39.0 41.0  39.0 43.8 42.8 40.6  MCV 95.9 96.8  --   --  95.0 94.7 94.9  PLT 265 274  --   --  318 303 295   Basic Metabolic Panel: Recent Labs  Lab 02/08/19 0242 02/09/19 0444 02/10/19 0421 02/10/19 0954 02/10/19 1030 02/11/19 0405 02/12/19 0330 02/13/19 0412  NA 140 139 137 139 136  137 134* 133*  --   K 3.7 3.5 4.3 4.3 4.4  3.9 4.2 4.2  --   CL 102 101 102  --   --  98 98  --   CO2 27 27 25   --   --  24 23  --   GLUCOSE 101* 98 95  --   --  104* 105*  --   BUN 12 12 10   --   --  11 9  --   CREATININE 0.85 0.83 0.92  --   --  0.93 0.94  --   CALCIUM 9.3 9.1 9.4  --   --  9.9 9.9  --   MG 1.9 1.6* 2.3  --   --  1.9 2.1 2.0   GFR: Estimated Creatinine Clearance: 41.5 mL/min (by C-G formula based on SCr of 0.94 mg/dL). Liver Function Tests: Recent Labs  Lab 02/09/19 0444 02/10/19 0421 02/11/19 0405 02/12/19 0330  AST 22 21 20 19   ALT 81* 65* 52* 44  ALKPHOS 50 53 53 55  BILITOT 1.1 0.9 1.5* 1.4*  PROT 5.4* 5.4* 5.9* 6.0*  ALBUMIN 3.0* 3.2* 3.3* 3.5    Recent Labs  Lab 02/11/19 0405  LIPASE 33   No results for input(s): AMMONIA in the last 168 hours. Coagulation Profile: No results for input(s): INR, PROTIME in the last 168 hours. Cardiac Enzymes: No results for input(s): CKTOTAL, CKMB, CKMBINDEX, TROPONINI in the last 168 hours. BNP (last 3  results) No results for input(s): PROBNP in the last 8760 hours. HbA1C: No results for input(s): HGBA1C in the last 72 hours. CBG: Recent Labs  Lab 02/12/19 1521 02/12/19 1942 02/12/19 2313 02/13/19 0340 02/13/19 0849  GLUCAP 109* 79 100* 93 95   Lipid Profile: No results for input(s): CHOL, HDL, LDLCALC, TRIG, CHOLHDL, LDLDIRECT in the last 72 hours. Thyroid Function Tests: No results for input(s): TSH, T4TOTAL, FREET4, T3FREE, THYROIDAB in the last 72 hours. Anemia Panel: No results for input(s): VITAMINB12, FOLATE, FERRITIN, TIBC, IRON, RETICCTPCT in the last 72 hours. Urine analysis:    Component Value Date/Time   COLORURINE YELLOW 02/01/2019 1003   APPEARANCEUR CLEAR 02/01/2019 1003   LABSPEC 1.012 02/01/2019 1003   PHURINE 7.0 02/01/2019 1003   GLUCOSEU 50 (A) 02/01/2019 1003   HGBUR SMALL (A) 02/01/2019 1003   BILIRUBINUR NEGATIVE 02/01/2019 1003   KETONESUR 5 (A) 02/01/2019 1003   PROTEINUR NEGATIVE 02/01/2019 1003   NITRITE NEGATIVE 02/01/2019 1003   LEUKOCYTESUR NEGATIVE 02/01/2019 1003   Sepsis Labs: Invalid input(s): PROCALCITONIN, LACTICIDVEN  Recent Results (from the past 240 hour(s))  Body fluid culture (includes gram stain)     Status: None   Collection Time: 02/04/19  1:10 PM   Specimen: Pleural Fluid  Result Value Ref Range Status   Specimen Description PLEURAL  Final   Special Requests FLUID  Final   Gram Stain   Final    ABUNDANT WBC PRESENT,BOTH PMN AND MONONUCLEAR NO ORGANISMS SEEN    Culture   Final    NO GROWTH Performed at [facility], [address], [place], [place] [address]    Report Status 02/07/2019 FINAL  Final      Radiology Studies: No results found.  Lashanna Angelo [place]  If 7PM-7AM, please contact night-coverage [email or web address] Password [facility] 02/13/2019, 12:10 PM

## 2019-02-13 NOTE — Progress Notes (Signed)
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 gains while on inpatient rehab.  These gains will be of substantial and practical use upon discharge in facilitating mobility and self-care at the household level. 5. Physiatrist will provide 24 hour management of medical needs as well as oversight of the therapy plan/treatment and provide guidance as appropriate regarding the interaction of the two. 6. 24 hour rehab nursing will assist in the management of  bladder management, bowel management, safety, skin/wound care, disease management, medication administration, pain management and patient education  and help integrate therapy concepts, techniques,education, etc. 7. PT will assess and treat for:pre gait, gait training, endurance , safety, equipment, neuromuscular re education  .  Goals are:  independent with assistive device. 8. OT will assess and treat for   ADLs, Cognitive perceptual skills, Neuromuscular re education, safety, endurance, equipment.  Goals are: supervision.  9. SLP will assess and treat for  .  Goals are: N/A. 10. Case Management and Social Worker will assess and treat for psychological issues and discharge planning. 11. Team conference will be held weekly to assess progress toward goals and to determine barriers to discharge. 12.  Patient will receive at least 3 hours of therapy per day at least 5 days per week. 13. ELOS and Prognosis: 7d excellent   "I have personally performed a face to face diagnostic evaluation of this patient.  Additionally, I have reviewed and concur with the physician assistant's documentation above."  Charlett Blake M.D. [facility] FAAPM&R (Sports Med, Neuromuscular Med) Diplomate Am Board of [place], PA-C 02/13/2019

## 2019-02-13 NOTE — TOC Transition Note (Signed)
Transition of Care [facility]) - CM/SW Discharge Note Marvetta Gibbons RN, BSN Transitions of Care Unit 4E- RN Case Manager [phone]   Patient Details  Name: [name] MRN: [id number] Date of Birth: December 06, 1945  Transition of Care [facility]) CM/SW Contact:  Dawayne Patricia, RN Phone Number: 02/13/2019, 2:39 PM   Clinical Narrative:    Notified by Pamala Hurry with CIR that pt has bed available for INPT rehab today. Pt stable for transition to rehab and plan will be to discharge to CIR later this afternoon.    Final next level of care: IP Rehab Facility Barriers to Discharge: No Barriers Identified   Patient Goals and CMS Choice Patient states their goals for this hospitalization and ongoing recovery are:: rehab CMS Medicare.gov Compare Post Acute Care list provided to:: Patient Choice offered to / list presented to : Patient  Discharge Placement  [facility] IP rehab                     Discharge Plan and Services                DME Arranged: N/A DME Agency: NA         HH Agency: NA        Social Determinants of Health ([place]) Interventions     Readmission Risk Interventions Readmission Risk Prevention Plan 02/13/2019  Transportation Screening Complete  PCP or Specialist Appt within 5-7 Days Complete  Home Care Screening Complete  Medication Review (RN CM) Complete  Some recent data might be hidden
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
## 2019-02-13 NOTE — Progress Notes (Signed)
Patient ID: [name], female   DOB: 07-Dec-1945, 73 y.o.   MRN: [id number] Patient admitted to [phone] via bed, escorted by nursing staff.  Patient verbalized understanding of rehab instructions including fall prevention policy, personal belongings policy, and visitation policy.  Appears to be in no immediate distress at this time.  Brita Romp, RN
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
## 2019-02-13 NOTE — TOC Benefit Eligibility Note (Signed)
Transition of Care [facility]) Benefit Eligibility Note    Patient Details  Name: [name] MRN: [id number] Date of Birth: Sep 10, 1945   Medication/Dose: Ross Marcus  60 MG EVERYDAY  Covered?: Yes  Tier: (4 DRUG    AND NON-PREFERRED)  Prescription Coverage Preferred Pharmacy: [place] with Person/Company/Phone Number:: [facility]  @   [place] [facility] # [phone]  Co-Pay: $ 128.78  Prior Approval: [phone])     Additional Notes: CARAFATE 1 GM PO EVERY 6 HOURS; NOT COVER  P/A [phone]    Memory Argue Phone Number: 02/13/2019, 9:17 AM

## 2019-02-13 NOTE — Progress Notes (Addendum)
Physical Therapy Treatment Patient Details Name: [name] MRN: [id number] DOB: 12/30/1945 Today's Date: 02/13/2019    History of Present Illness Pt is a 73 year old woman admitted 02/01/19 with acute on chronic respiratory failure, profound hypokalemia and hypomagnsemia. Intubated 6/13-6/16. Thoracentesis 6/15. PMH: COPD, afib wit RR, CHF, chronic GI issues and malnutrition, CKD.     PT Comments    Pt was agreeable to participation.  She explain this was a more painful knee day.  Gait noticeably more antalgic, more frequent stopping, staying a little too far outside of the RW.     Follow Up Recommendations  CIR;Supervision/Assistance - 24 hour     Equipment Recommendations  None recommended by PT    Recommendations for Other Services       Precautions / Restrictions Precautions Precautions: Fall Restrictions Weight Bearing Restrictions: No    Mobility  Bed Mobility Overal bed mobility: Needs Assistance Bed Mobility: Supine to Sit;Sit to Supine     Supine to sit: Supervision Sit to supine: Supervision   General bed mobility comments: pt moves slowly and guarded with R LE, but no assist  Transfers Overall transfer level: Needs assistance Equipment used: Rolling walker (2 wheeled) Transfers: Sit to/from Stand Sit to Stand: Min assist         General transfer comment: today light boost due to more painful R knee.  Cues for hand placement  Ambulation/Gait Ambulation/Gait assistance: Min assist Gait Distance (Feet): 60 Feet(x2) Assistive device: Rolling walker (2 wheeled) Gait Pattern/deviations: Step-through pattern;Step-to pattern Gait velocity: slower Gait velocity interpretation: 1.31 - 2.62 ft/sec, indicative of limited community ambulator General Gait Details: The further pt went, the more antalgic she became on her right.   Stairs             Wheelchair Mobility    Modified Rankin (Stroke Patients Only)       Balance Overall balance  assessment: Needs assistance   Sitting balance-Leahy Scale: Fair       Standing balance-Leahy Scale: Poor Standing balance comment: reliant on the RW                            Cognition Arousal/Alertness: Awake/alert Behavior During Therapy: WFL for tasks assessed/performed Overall Cognitive Status: Within Functional Limits for tasks assessed                                        Exercises      General Comments General comments (skin integrity, edema, etc.): vss      Pertinent Vitals/Pain Pain Assessment: Faces Faces Pain Scale: Hurts even more Pain Location: right Knee Pain Descriptors / Indicators: Discomfort;Sore;Sharp(chrunching) Pain Intervention(s): Monitored during session    Home Living       Type of Home: Other(Comment)       Home Equipment: Walker - 2 wheels;Shower seat;Hand held shower head;Bedside commode;Cane - single point;Grab bars - tub/shower;Wheelchair - manual      Prior Function            PT Goals (current goals can now be found in the care plan section) Acute Rehab PT Goals PT Goal Formulation: With patient Time For Goal Achievement: 02/19/19 Potential to Achieve Goals: Good Progress towards PT goals: Progressing toward goals    Frequency    Min 3X/week      PT Plan Current plan remains  appropriate    Co-evaluation              AM-PAC PT "6 Clicks" Mobility   Outcome Measure  Help needed turning from your back to your side while in a flat bed without using bedrails?: None Help needed moving from lying on your back to sitting on the side of a flat bed without using bedrails?: None Help needed moving to and from a bed to a chair (including a wheelchair)?: A Little Help needed standing up from a chair using your arms (e.g., wheelchair or bedside chair)?: A Little Help needed to walk in hospital room?: A Little Help needed climbing 3-5 steps with a railing? : A Lot 6 Click Score: 19     End of Session   Activity Tolerance: Patient tolerated treatment well;Patient limited by pain Patient left: in bed;with call bell/phone within reach Nurse Communication: Mobility status PT Visit Diagnosis: Unsteadiness on feet (R26.81);Other abnormalities of gait and mobility (R26.89);Pain Pain - Right/Left: Right Pain - part of body: Knee     Time: 1225-1243 PT Time Calculation (min) (ACUTE ONLY): 18 min  Charges:  $Gait Training: 8-22 mins                     02/13/2019  Donnella Sham, PT Acute Rehabilitation Services [phone]  (pager) [phone]  (office)   Tessie Fass Shariyah Eland 02/13/2019, 1:09 PM

## 2019-02-13 NOTE — H&P (Signed)
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 mobility and self-care at the household level. 5. Physiatrist will provide 24 hour management of medical needs as well as oversight of the therapy plan/treatment and provide guidance as appropriate regarding the interaction of the two. 6. 24 hour rehab nursing will assist in the management of  bladder management, bowel management, safety, skin/wound care, disease management, medication administration, pain management and patient education  and help integrate therapy concepts, techniques,education, etc. 7. PT will assess and treat for:pre gait, gait training, endurance , safety, equipment, neuromuscular re education  .  Goals are: independent with assistive device. 8. OT will assess and treat for ADLs, Cognitive perceptual skills, Neuromuscular re  education, safety, endurance, equipment  .  Goals are: supervision.  9. SLP will assess and treat forNA  .  Goals are: N/A. 10. Case Management and Social Worker will assess and treat for psychological issues and discharge planning. 11. Team conference will be held weekly to assess progress toward goals and to determine barriers to discharge. 12.  Patient will receive at least 3 hours of therapy per day at least 5 days per week. 13. ELOS and Prognosis: 7d excellent  "I have personally performed a face to face diagnostic evaluation of this patient.  Additionally, I have reviewed and concur with the physician assistant's documentation above."   Charlett Blake M.D. [facility] FAAPM&R (Sports Med, Neuromuscular Med) Diplomate Am Board of Electrodiagnostic Med Fellow AM Board of Interventional Pain Physicians Bary Leriche, PA-C 02/13/2019

## 2019-02-13 NOTE — Discharge Summary (Signed)
Physician Discharge Summary  AREAL COCHRANE MWU:[id number] DOB: [birth year] DOA: 02/01/2019  PCP: Prince Solian, MD  Admit date: 02/01/2019 Discharge date: 02/13/2019  Admitted From: Home Disposition: CIR  Recommendations for Outpatient Follow-up:  1. Follow up with cardiology and gastroenterology "Dr. Hilarie Fredrickson" in 2-3 weeks 2. Please obtain CBC/BMP/Mag at follow up 3. Please follow up on the following pending results: Gastrin level, H. pylori test and duodenal biopsy  Home Health: Not applicable Equipment/Devices: Not applicable  Discharge Condition: Stable CODE STATUS: Full code  Hospital Course: 73 year old with history of essential hypertension, hyperlipidemia, paroxysmal A. fib on Eliquis, restless leg syndrome, hypothyroidism, CVA, depression, COPD initially presented with weakness and right knee pain. Found to have significant electrolyte abnormality and diarrhea developed significant hypoxia. Diagnosed with staph aureus pneumonia and right-sided transudative pleural effusion requiring thoracentesis 6/16. Intubated from 6/13-6/16, central line placed 6/13. Initially on vancomycin and cefepime later switched to Rocephin. Also diagnosed with systolic CHF with ejection fraction 20%/Takotsubo pattern, heart failure team consulted. Limited echo on 6/22 with EF of 55%.  R/LHC with normal coronary arteries, LV function and low filling pressures.   Patient had return of any nausea/vomiting/abdominal pain and diarrhea the night of 6/22-6/23.  CT abdomen and pelvis on 6/23 without significant finding to explain patient's GI symptoms.  Gastroenterology consulted. Patient had an EGD on 02/12/2019 that showed normal esophagus,, erythematous mucosa in the antrum without ulceration (biopsied for H. pylori), benign gastric polyp that was resected, moderate duodenitis with erosions and superficial small ulceration (biopsied).  Patient was evaluated by PT/OT who recommended CIR.  See  individual problem list below for more. Discharge Diagnoses:  Principal Problem:   Acute encephalopathy Active Problems:   Acute respiratory failure with hypoxia (HCC)   Hypokalemia   Hypomagnesemia   Leukocytosis   Osteoarthritis   Hypothyroidism   Pneumonia of right lower lobe due to methicillin susceptible Staphylococcus aureus (MSSA) (HCC)   Volume overload state of heart   Pressure injury of skin   Nausea and vomiting   Aspiration pneumonia (HCC)   Chronic diarrhea  Recurrent nausea/vomiting/diarrhea/abdominal pain: chronic issue. On multiple medications with cholinergic side effects.  Had mild LFT which has resolved. Has had aspiration pneumonia requiring intubation this admission.  She has no fever or significant leukocytosis.  Lipase within normal range.   -CT abdomen and pelvis on 6/23 without significant finding. -EGD on 6/24 as above. -No further emesis, diarrhea or abdominal pain the last 48 hours. -Appreciate GI recommendations             -Twice daily Protonix or daily Dexilant, Carafate             -Consider Reglan, Colestid or cholestyramine             -Gastrin level, MBS and outpatient follow-up with Dr. Hilarie Fredrickson -Patient initially agreed to stopping fentanyl while with family member but changed her mind later on.  Acute on chronic hypoxemic respiratory failure-multifactorial including aspiration/MSSA pneumonia, right-sided pleural effusion and acute systolic CHF.  Patient is currently on room air. -ETT 6/13-6/16 in the setting of encephalopathy.  -Treat treatable causes as below.  Acute on chronic systolic CHF: Initially concerned about Takotsubo CM.  Echo on 6/14 with EF of 20%.  Repeat limited echo on 6/22 with EF of 55%.  R/LHC on 6/22 not impressive.  Patient appears euvolemic.  Respiratory failure resolved.  Intake output was not captured well in epic. -Cardiology managing:             -  Recommended discharge on bisoprolol, losartan, Aldactone and Eliquis.  No  Lasix -Daily weight and intake output.  Recheck BMP and magnesium in about a week.  Right-sided pleural effusion: thoracocentesis on 6/16 with removal of 750 cc culture negative transudative fluid.  Suspect this to be due to CHF. -Manage CHF as above  Aspiration pneumonia: Concern about this based on chest x-ray which showed right lung opacity which could also be due to pleural effusion.  Respiratory culture with moderate staph aureus.  Blood cultures negative.  Procalcitonin negative.  -Ceftriaxone 6/13-6/15, 6/17- 6/18 -Cefepime 6/15-6/16 -Cefdinir 6/18-6/22  Chronic atrial fibrillation with intermittent RVR: Stable.  On Cardizem and Eliquis at home -Continue bisoprolol and Eliquis  History of right nephrectomy -Avoid nephrotoxic meds  Moderate protein calorie malnutrition: Likely due to acute illness.  BMI 18.8 -Appreciate dietitian input-dysphagia diet, supplements  Acute metabolic encephalopathy: Likely due to acute illness and possible delirium.  Resolved.  Anxiety/depression: Stable -Continue home Wellbutrin and Prozac although this is not a good choice for anxiety.  Hypothyroidism: Stable. -Continue home Synthroid  Generalized weakness/debility -PT/OT-CIR.   Discharge Instructions  Discharge Instructions    (HEART FAILURE PATIENTS) Call MD:  Anytime you have any of the following symptoms: 1) 3 pound weight gain in 24 hours or 5 pounds in 1 week 2) shortness of breath, with or without a dry hacking cough 3) swelling in the hands, feet or stomach 4) if you have to sleep on extra pillows at night in order to breathe.   Complete by: As directed    Call MD for:  extreme fatigue   Complete by: As directed    Call MD for:  persistant dizziness or light-headedness   Complete by: As directed    Call MD for:  persistant nausea and vomiting   Complete by: As directed    Call MD for:  severe uncontrolled pain   Complete by: As directed    Diet - low sodium heart  healthy   Complete by: As directed    Increase activity slowly   Complete by: As directed      Allergies as of 02/13/2019      Reactions   Penicillins    Causes rash Has patient had a PCN reaction causing immediate rash, facial/tongue/throat swelling, SOB or lightheadedness with hypotension: YES Has patient had a PCN reaction causing severe rash involving mucus membranes or skin necrosis: No Has patient had a PCN reaction that required hospitalization No Has patient had a PCN reaction occurring within the last 10 years: No If all of the above answers are "NO", then may proceed with Cephalosporin use.      Medication List    STOP taking these medications   diltiazem 240 MG 24 hr capsule Commonly known as: CARDIZEM CD   fentaNYL 100 MCG/HR Commonly known as: DURAGESIC Replaced by: fentaNYL 25 MCG/HR   furosemide 20 MG tablet Commonly known as: Lasix   HYDROcodone-acetaminophen 5-325 MG tablet Commonly known as: NORCO/VICODIN   hyoscyamine 0.125 MG SL tablet Commonly known as: Levsin/SL   irbesartan 300 MG tablet Commonly known as: AVAPRO   methocarbamol 500 MG tablet Commonly known as: ROBAXIN   omeprazole 40 MG capsule Commonly known as: PRILOSEC   potassium chloride SA 20 MEQ tablet Commonly known as: K-DUR   zolpidem 10 MG tablet Commonly known as: AMBIEN     TAKE these medications   acetaminophen 500 MG tablet Commonly known as: TYLENOL Take 1,000 mg by mouth every 8 (eight) hours  as needed for mild pain.   apixaban 5 MG Tabs tablet Commonly known as: ELIQUIS Take 1 tablet (5 mg total) by mouth 2 (two) times daily.   atorvastatin 10 MG tablet Commonly known as: LIPITOR Take 10 mg by mouth daily.   bisoprolol 5 MG tablet Commonly known as: ZEBETA Take 0.5 tablets (2.5 mg total) by mouth daily. Start taking on: February 14, 2019   buPROPion 150 MG 12 hr tablet Commonly known as: WELLBUTRIN SR Take 150 mg by mouth daily.   colestipol 1 g  tablet Commonly known as: COLESTID Take 1 tablet (1 g total) by mouth 2 (two) times daily.   feeding supplement (ENSURE ENLIVE) Liqd Take 237 mLs by mouth 3 (three) times daily between meals.   fentaNYL 25 MCG/HR Commonly known as: [place] 1 patch onto the skin every 3 (three) days. Start taking on: February 16, 2019 Replaces: fentaNYL 100 MCG/HR   FLUoxetine 20 MG capsule Commonly known as: PROZAC Take 20 mg by mouth at bedtime.   fluticasone 50 MCG/ACT nasal spray Commonly known as: FLONASE Place 1 spray into both nostrils daily as needed for allergies.   levothyroxine 88 MCG tablet Commonly known as: SYNTHROID Take 88 mcg by mouth daily.   loratadine 10 MG tablet Commonly known as: CLARITIN Take 10 mg daily by mouth.   losartan 25 MG tablet Commonly known as: COZAAR Take 1 tablet (25 mg total) by mouth daily. Start taking on: February 14, 2019   ondansetron 4 MG tablet Commonly known as: ZOFRAN Take 1 tablet (4 mg total) by mouth every 8 (eight) hours as needed for nausea.   pantoprazole 40 MG tablet Commonly known as: PROTONIX Take 1 tablet (40 mg total) by mouth 2 (two) times daily.   polyethylene glycol 17 g packet Commonly known as: MIRALAX / GLYCOLAX Take 17 g by mouth daily as needed for moderate constipation or severe constipation.   rOPINIRole 1 MG tablet Commonly known as: REQUIP Take 1 mg by mouth at bedtime as needed (restless leg).   spironolactone 25 MG tablet Commonly known as: ALDACTONE Take 0.5 tablets (12.5 mg total) by mouth daily. Start taking on: February 14, 2019   sucralfate 1 g tablet Commonly known as: Carafate Take 1 tablet before meals and at bedtime (4 times daily)-make into slurry   VITAMIN D PO Take 1 capsule by mouth at bedtime. Take on        Consultations:  Gastroenterology  Cardiology  PCCM  Procedures/Studies:  Complete TTE on 02/02/2019  1. The left ventricle has a 2D calculated ejection fraction 20%. The  cavity size was normal. There is mildly increased left ventricular wall thickness. Left ventricular diastolic Doppler parameters are consistent with impaired relaxation.  2. Diffuse hypokinesis of left ventricle with mild basilar sparing.     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  3. The right ventricle has normal systolic function. The cavity was normal. There is no increase in right ventricular wall thickness. Right ventricular systolic pressure is moderately elevated.  4. Left atrial size was mildly dilated.  5. Right atrial size was mildly dilated.  6. Tricuspid valve regurgitation is severe.  7. The aortic valve is tricuspid. Mild thickening of the aortic valve. Mild calcification of the aortic valve. Aortic valve regurgitation is trivial by color flow Doppler.  8. The inferior vena cava was dilated in size with <50% respiratory variability.   Limited TTE on 02/10/2019  1. The left ventricle has low normal systolic function, with an  ejection fraction of 50-55%. The cavity size was normal. Left ventricular diastolic function could not be evaluated secondary to atrial fibrillation.  2. The right ventricle has normal systolc function. The cavity was normal.  3. The mitral valve is grossly normal. Mild thickening of the mitral valve leaflet.  4. The tricuspid valve was grossly normal.  5. The aortic valve is tricuspid Mild thickening of the aortic valve. No stenosis of the aortic valve.  6. Low normal LV systolic function; moderate biatrial enlargement.  R/LHC on 02/10/2019 1. Essentially normal coronary arteries 2. Normal LV function EF 55% 3. Low filling pressures 4. Small venous wire perforation at the junction of the right cephalic vein and right subclavian vein  EGD on 02/12/2019 - Z-line regular, 37 cm from the incisors. - Normal esophagus - nothing to account for thickening noted on CT scan - Erythematous mucosa in the antrum without ulceration. Biopsy for H pylori - Benign  gastric polyp, removed with forceps. - Mod duodenitis with erosions and superficial small ulcerations. Biopsied. Recommendations: wean narc, PPI bid (not Prilosec), Carafate, try reglan, Colestid or Cholestyramine, gastrin level, MBS, f/u with Dr. Hilarie Fredrickson Dg Chest 1 View  Result Date: 02/05/2019 CLINICAL DATA:  Displacement of central venous catheter EXAM: CHEST  1 VIEW COMPARISON:  02/05/2019 FINDINGS: Left central line has been retracted with the tip near the confluence of the innominate veins. Cardiomegaly. Continued diffuse airspace disease throughout the right lung and left lower lung with small bilateral effusions. IMPRESSION: Left central line has been retracted with the tip now at the confluence of the innominate veins. Otherwise no change. Electronically Signed   By: Rolm Baptise M.D.   On: 02/05/2019 22:02   Dg Abd 1 View  Result Date: 02/01/2019 CLINICAL DATA:  OG tube placement EXAM: ABDOMEN - 1 VIEW COMPARISON:  06/27/2017 FINDINGS: OG tube tip is in the mid stomach with the side port in the proximal stomach. Nonobstructive bowel gas pattern. IMPRESSION: OG tube tip in the mid stomach. Electronically Signed   By: Rolm Baptise M.D.   On: 02/01/2019 20:10   Ct Head Wo Contrast  Result Date: 02/01/2019 CLINICAL DATA:  Altered level of consciousness, history COPD, chronic kidney disease, hypertension, stroke EXAM: CT HEAD WITHOUT CONTRAST TECHNIQUE: Contiguous axial images were obtained from the base of the skull through the vertex without intravenous contrast. Sagittal and coronal MPR images reconstructed from axial data set. Patient noncompliant with imaging instructions due to altered mental status. COMPARISON:  02/05/2017 FINDINGS: Brain: Extensive artifacts despite repeating images. Generalized atrophy. Normal ventricular morphology. No obvious midline shift or mass effect. Small vessel chronic ischemic changes of deep cerebral white matter. Within limits of exam no gross evidence of mass  hemorrhage or infarct identified. Vascular: No gross abnormality seen Skull: Unremarkable Sinuses/Orbits: Clear Other: N/A IMPRESSION: Limited exam due to motion artifacts. Atrophy with small vessel chronic ischemic changes of deep cerebral white matter. No definite acute intracranial abnormalities. Electronically Signed   By: Lavonia Dana M.D.   On: 02/01/2019 13:26   Ct Abdomen Pelvis W Contrast  Addendum Date: 02/11/2019   ADDENDUM REPORT: 02/11/2019 21:22 ADDENDUM: There is mild diffuse wall thickening of the esophagus which can be seen in patients with esophagitis. Electronically Signed   By: Constance Holster M.D.   On: 02/11/2019 21:22   Result Date: 02/11/2019 CLINICAL DATA:  Abdominal pain EXAM: CT ABDOMEN AND PELVIS WITH CONTRAST TECHNIQUE: Multidetector CT imaging of the abdomen and pelvis was performed using the standard protocol  following bolus administration of intravenous contrast. CONTRAST:  152mL OMNIPAQUE IOHEXOL 300 MG/ML  SOLN COMPARISON:  CT dated May 02, 2011. FINDINGS: Lower chest: There is a small right-sided pleural effusion with adjacent compressive atelectasis. The heart size is mild-to-moderately enlarged. Hepatobiliary: There are innumerable small cystic structures throughout the liver that are too small to characterize but are statistically most likely to represent benign cysts. The patient is status post prior cholecystectomy. There is mild intrahepatic and extrahepatic biliary ductal dilatation. Again identified is a large hypoattenuating partially calcified mass in the left hepatic lobe. 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 Pancreas: The pancreatic duct is dilated. There is no discrete pancreatic mass. Spleen: Normal in size without focal abnormality. Adrenals/Urinary Tract: The right kidney is absent. The right adrenal gland is not well visualized. The left adrenal gland is unremarkable. The left kidney is unremarkable. There is a moderate amount of  gas in the urinary bladder. Stomach/Bowel: There is oral contrast the level of the rectum. There is mild diffuse wall thickening of the esophagus. The stomach is grossly unremarkable. There is no evidence of a small-bowel obstruction. No CT evidence of colitis. Vascular/Lymphatic: Diffuse atherosclerotic changes are noted of the abdominal aorta. 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 This is stable from prior CT in 2012. Reproductive: Status post hysterectomy. No adnexal masses. Other: No abdominal wall hernia or abnormality. No abdominopelvic ascites. Musculoskeletal: No fracture is seen. IMPRESSION: 1. No acute intra-abdominal abnormality detected. 2. Small partially visualized right-sided pleural effusion. 3. Status post cholecystectomy. There is intrahepatic and extrahepatic biliary ductal dilatation. There is pancreatic ductal dilatation. Correlation with laboratory studies is recommended. If there is clinical concern for an obstructing process, follow-up with outpatient MRCP or ERCP is recommended. 4. 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 This is essentially stable since 2012. Aortic Atherosclerosis (ICD10-I70.0). Electronically Signed: By: Constance Holster M.D. On: 02/11/2019 21:09   Dg Chest Port 1 View  Result Date: 02/08/2019 CLINICAL DATA:  73 year old female with respiratory failure. EXAM: PORTABLE CHEST 1 VIEW COMPARISON:  02/05/2019 FINDINGS: Cardiomegaly, pulmonary vascular congestion and interstitial opacities/edema again noted. Bilateral LOWER lung atelectasis/consolidation and probable effusions noted. There is no evidence of pneumothorax. LEFT IJ central venous catheter has been removed. IMPRESSION: LEFT central venous catheter removal, otherwise unchanged appearance of the chest with cardiomegaly, interstitial opacities/edema, probable pleural effusions and bibasilar atelectasis/consolidation. Electronically Signed   By: Margarette Canada M.D.   On:  02/08/2019 14:36   Dg Chest Port 1 View  Result Date: 02/05/2019 CLINICAL DATA:  Status post thoracentesis EXAM: PORTABLE CHEST 1 VIEW COMPARISON:  February 04, 2019 FINDINGS: The left central line is stable. The ET tube is been removed. Stable cardiomegaly. The hila and mediastinum are unremarkable. No pneumothorax. No nodules or masses. Continued opacity throughout the right lung, likely layering effusion with underlying opacity, either atelectasis or infiltrate. Asymmetric edema is also possible. Retrocardiac opacity on the left with a probable small left effusion is stable. IMPRESSION: 1. The ET tube is been removed.  The left central line is stable. 2. Continued opacity throughout the right lung. There is likely a component of layering effusion. The underlying opacity could represent asymmetric edema, atelectasis, or infiltrate. 3. Small effusion and underlying atelectasis on the left. Electronically Signed   By: Dorise Bullion III M.D   On: 02/05/2019 08:15   Dg Chest Port 1 View  Result Date: 02/04/2019 CLINICAL DATA:  73 year old female with  a history of endotracheal tube EXAM: PORTABLE CHEST 1 VIEW COMPARISON:  02/03/2019 02/02/2019, 02/01/2019 FINDINGS: Cardiomediastinal silhouette unchanged in size and contour. Unchanged endotracheal tube terminating just above the carina 2.8 cm. Unchanged gastric tube terminating below the diaphragm out of the field of view. Unchanged left IJ central venous catheter with the tip appearing to terminate superior vena cava. Similar hazy opacity of the right chest with obscuration of the right hemidiaphragm and the right heart border. Blunting at the left costophrenic angle with retrocardiac opacity. No pneumothorax. No displaced fracture visualized. IMPRESSION: Similar appearance of layered right pleural effusion with associated atelectasis/consolidation. Small left pleural effusion. Unchanged endotracheal tube, gastric tube, and left IJ central venous catheter.  Electronically Signed   By: Corrie Mckusick D.O.   On: 02/04/2019 08:31   Dg Chest Port 1 View  Result Date: 02/03/2019 CLINICAL DATA:  Acute respiratory failure with hypoxia. EXAM: PORTABLE CHEST 1 VIEW COMPARISON:  02/02/2019. FINDINGS: Endotracheal tube terminates approximately 3 cm above the carina. Left IJ central line tip is in the SVC. Heart is enlarged, stable. Thoracic aorta is calcified. Biapical pleural thickening. Mild diffuse mixed interstitial and airspace opacification, right greater than left, with small layering bilateral pleural effusions. Left lower lobe consolidation is new. IMPRESSION: 1. New left lower lobe airspace opacification may be due to atelectasis. 2. Mixed interstitial and airspace opacification, right greater than left, with bilateral pleural effusions, findings which are suspicious for congestive heart failure. Superimposed pneumonia in the right upper lobe is not excluded. 3.  Aortic atherosclerosis (ICD10-170.0). Electronically Signed   By: Lorin Picket M.D.   On: 02/03/2019 07:42   Dg Chest Port 1 View  Result Date: 02/02/2019 CLINICAL DATA:  Acute respiratory failure with hypoxia. Endotracheal tube present. EXAM: PORTABLE CHEST 1 VIEW COMPARISON:  02/01/2019 FINDINGS: Support lines and tubes in appropriate position. Stable cardiomegaly. Increased asymmetric airspace disease is seen throughout the right lung. No definite pleural effusion. IMPRESSION: 1. Increased asymmetric airspace disease throughout right lung. 2. Stable cardiomegaly. Electronically Signed   By: Earle Gell M.D.   On: 02/02/2019 09:48   Dg Chest Port 1 View  Result Date: 02/01/2019 CLINICAL DATA:  Intubation, OG tube placement EXAM: PORTABLE CHEST 1 VIEW COMPARISON:  02/01/2019 FINDINGS: NG tube is near the level of the carina, approximately 6 mm above the carina. This could be retracted approximately 2 cm for optimal positioning. OG tube enters the stomach. Left central line is been placed with the  tip in the SVC. No pneumothorax. Improving aeration in the lungs with decreasing airspace disease throughout the right lung. Heart is borderline in size. No effusions. Mild hyperinflation. IMPRESSION: Endotracheal tube near the level of the carina. This could be retracted approximately 2 cm for optimal positioning. Improved aeration and decreased airspace disease within the right lung. Mild hyperinflation. Electronically Signed   By: Rolm Baptise M.D.   On: 02/01/2019 20:09   Dg Chest Port 1 View  Result Date: 02/01/2019 CLINICAL DATA:  Shortness of breath. EXAM: PORTABLE CHEST 1 VIEW COMPARISON:  June 26, 2017 FINDINGS: Diffuse pulmonary opacity in the right lung. The left lung is essentially clear. Cardiomegaly. No other acute abnormalities. IMPRESSION: Right-sided pulmonary opacity worrisome for infiltrate/pneumonia. Asymmetric edema considered less likely. Recommend follow-up to resolution. Electronically Signed   By: Dorise Bullion III M.D   On: 02/01/2019 15:25      Subjective: No major events overnight of this morning.  She had no further emesis, diarrhea or abdominal pain.  She  felt nauseous overnight.  Nausea resolved with IV Zofran.  Denies chest pain, dyspnea or palpitation.  Family, Dr. Vanetta Shawl at bedside.  Discussed about CT abdomen and pelvis and the plan going forward.  Patient in agreement on discontinuing fentanyl patch to see if a GI symptoms are related to this.  She is only on 25 MCG at this time.   Discharge Exam: Vitals:   02/12/19 1938 02/13/19 0339  BP: 121/80 102/66  Pulse: 80   Resp: 16 16  Temp: 98 F (36.7 C) 98.1 F (36.7 C)  SpO2: 98% 98%    GENERAL: No acute distress.  Appears well.  HEENT: MMM.  Vision and hearing grossly intact.  NECK: Supple.  No JVD.  LUNGS:  No IWOB. Good air movement bilaterally. HEART:  RRR. Heart sounds normal.  ABD: Bowel sounds present. Soft. Non tender.  MSK/EXT:  Moves all extremities. No apparent deformity. No edema  bilaterally. SKIN: no apparent skin lesion or wound NEURO: Awake, alert and oriented appropriately.  No gross deficit.  PSYCH: Calm. Normal affect.   The results of significant diagnostics from this hospitalization (including imaging, microbiology, ancillary and laboratory) are listed below for reference.     Microbiology: Recent Results (from the past 240 hour(s))  Body fluid culture (includes gram stain)     Status: None   Collection Time: 02/04/19  1:10 PM   Specimen: Pleural Fluid  Result Value Ref Range Status   Specimen Description PLEURAL  Final   Special Requests FLUID  Final   Gram Stain   Final    ABUNDANT WBC PRESENT,BOTH PMN AND MONONUCLEAR NO ORGANISMS SEEN    Culture   Final    NO GROWTH Performed at [facility], [address] [place], [place] [address]    Report Status 02/07/2019 FINAL  Final     Labs: BNP (last 3 results) Recent Labs    02/09/19 0444  BNP 7,622.6*   Basic Metabolic Panel: Recent Labs  Lab 02/08/19 0242 02/09/19 0444 02/10/19 0421 02/10/19 0954 02/10/19 1030 02/11/19 0405 02/12/19 0330 02/13/19 0412  NA 140 139 137 139 136   137 134* 133*  --   K 3.7 3.5 4.3 4.3 4.4   3.9 4.2 4.2  --   CL 102 101 102  --   --  98 98  --   CO2 27 27 25   --   --  24 23  --   GLUCOSE 101* 98 95  --   --  104* 105*  --   BUN 12 12 10   --   --  11 9  --   CREATININE 0.85 0.83 0.92  --   --  0.93 0.94  --   CALCIUM 9.3 9.1 9.4  --   --  9.9 9.9  --   MG 1.9 1.6* 2.3  --   --  1.9 2.1 2.0   Liver Function Tests: Recent Labs  Lab 02/09/19 0444 02/10/19 0421 02/11/19 0405 02/12/19 0330  AST 22 21 20 19   ALT 81* 65* 52* 44  ALKPHOS 50 53 53 55  BILITOT 1.1 0.9 1.5* 1.4*  PROT 5.4* 5.4* 5.9* 6.0*  ALBUMIN 3.0* 3.2* 3.3* 3.5   Recent Labs  Lab 02/11/19 0405  LIPASE 33   No results for input(s): AMMONIA in the last 168 hours. CBC: Recent Labs  Lab 02/09/19 0444 02/10/19 0421 02/10/19 0954 02/10/19 1030 02/11/19 0405  02/12/19 0330 02/13/19 0412  WBC 9.8 9.0  --   --  11.0* 11.7* 7.0  HGB 13.2 14.1 13.3 13.9   13.3 14.3 14.5 13.7  HCT 39.4 42.4 39.0 41.0   39.0 43.8 42.8 40.6  MCV 95.9 96.8  --   --  95.0 94.7 94.9  PLT 265 274  --   --  318 303 285   Cardiac Enzymes: No results for input(s): CKTOTAL, CKMB, CKMBINDEX, TROPONINI in the last 168 hours. BNP: Invalid input(s): POCBNP CBG: Recent Labs  Lab 02/12/19 1521 02/12/19 1942 02/12/19 2313 02/13/19 0340 02/13/19 0849  GLUCAP 109* 79 100* 93 95   D-Dimer No results for input(s): DDIMER in the last 72 hours. Hgb A1c No results for input(s): HGBA1C in the last 72 hours. Lipid Profile No results for input(s): CHOL, HDL, LDLCALC, TRIG, CHOLHDL, LDLDIRECT in the last 72 hours. Thyroid function studies No results for input(s): TSH, T4TOTAL, T3FREE, THYROIDAB in the last 72 hours.  Invalid input(s): FREET3 Anemia work up No results for input(s): VITAMINB12, FOLATE, FERRITIN, TIBC, IRON, RETICCTPCT in the last 72 hours. Urinalysis    Component Value Date/Time   COLORURINE YELLOW 02/01/2019 1003   APPEARANCEUR CLEAR 02/01/2019 1003   LABSPEC 1.012 02/01/2019 1003   PHURINE 7.0 02/01/2019 1003   GLUCOSEU 50 (A) 02/01/2019 1003   HGBUR SMALL (A) 02/01/2019 1003   BILIRUBINUR NEGATIVE 02/01/2019 1003   KETONESUR 5 (A) 02/01/2019 1003   PROTEINUR NEGATIVE 02/01/2019 1003   NITRITE NEGATIVE 02/01/2019 1003   LEUKOCYTESUR NEGATIVE 02/01/2019 1003   Sepsis Labs Invalid input(s): PROCALCITONIN,  WBC,  LACTICIDVEN   Time coordinating discharge: 45 minutes  SIGNED:  Mercy Riding, MD  [facility] 02/13/2019, 1:03 PM  If 7PM-7AM, please contact night-coverage [email or web address] Password [email or web address]

## 2019-02-13 NOTE — Care Management Important Message (Signed)
Important Message  Patient Details  Name: ANYLAH SCHEIB MRN: [id number] Date of Birth: 08/08/1946   Medicare Important Message Given:  Yes     Shelda Altes 02/13/2019, 1:26 PM

## 2019-02-13 NOTE — Progress Notes (Signed)
ANTICOAGULATION CONSULT NOTE - Follow Up Consult  Pharmacy Consult for heparin Indication: atrial fibrillation  Labs: Recent Labs    02/11/19 0405  02/12/19 0330 02/12/19 1855 02/13/19 0412  HGB 14.3  --  14.5  --  13.7  HCT 43.8  --  42.8  --  40.6  PLT 318  --  303  --  285  HEPARINUNFRC  --    < > 0.79* 0.26* 0.37  CREATININE 0.93  --  0.94  --   --    < > = values in this interval not displayed.    Assessment/Plan:  73yo female therapeutic on heparin after rate change. Will continue gtt at current rate and confirm stable with additional level.   Wynona Neat, PharmD, BCPS  02/13/2019,5:13 AM

## 2019-02-13 NOTE — Progress Notes (Signed)
Inpatient Rehabilitation Admissions Coordinator  I met with patient to offer an inpt rehab admit for today. She is in agreement. I will contact Dr. Cyndia Skeeters to arrange d/c.RN CM, Kristi made aware. I will make the arrangements to admit today.  Danne Baxter, RN, MSN Rehab Admissions Coordinator [phone] 02/13/2019 12:57 PM

## 2019-02-14 ENCOUNTER — Inpatient Hospital Stay (HOSPITAL_COMMUNITY): Payer: Medicare Other | Admitting: Physical Therapy

## 2019-02-14 ENCOUNTER — Inpatient Hospital Stay (HOSPITAL_COMMUNITY): Payer: Medicare Other | Admitting: Speech Pathology

## 2019-02-14 ENCOUNTER — Inpatient Hospital Stay (HOSPITAL_COMMUNITY): Payer: Medicare Other | Admitting: Occupational Therapy

## 2019-02-14 ENCOUNTER — Inpatient Hospital Stay (HOSPITAL_COMMUNITY): Payer: Medicare Other

## 2019-02-14 LAB — CBC WITH DIFFERENTIAL/PLATELET
Abs Immature Granulocytes: 0.06 10*3/uL (ref 0.00–0.07)
Basophils Absolute: 0 10*3/uL (ref 0.0–0.1)
Basophils Relative: 0 %
Eosinophils Absolute: 0.1 10*3/uL (ref 0.0–0.5)
Eosinophils Relative: 1 %
HCT: 40.3 % (ref 36.0–46.0)
Hemoglobin: 13.4 g/dL (ref 12.0–15.0)
Immature Granulocytes: 1 %
Lymphocytes Relative: 34 %
Lymphs Abs: 2.4 10*3/uL (ref 0.7–4.0)
MCH: 31.5 pg (ref 26.0–34.0)
MCHC: 33.3 g/dL (ref 30.0–36.0)
MCV: 94.8 fL (ref 80.0–100.0)
Monocytes Absolute: 0.6 10*3/uL (ref 0.1–1.0)
Monocytes Relative: 9 %
Neutro Abs: 3.9 10*3/uL (ref 1.7–7.7)
Neutrophils Relative %: 55 %
Platelets: 282 10*3/uL (ref 150–400)
RBC: 4.25 MIL/uL (ref 3.87–5.11)
RDW: 12.8 % (ref 11.5–15.5)
WBC: 7.1 10*3/uL (ref 4.0–10.5)
nRBC: 0 % (ref 0.0–0.2)

## 2019-02-14 LAB — MAGNESIUM: Magnesium: 2 mg/dL (ref 1.7–2.4)

## 2019-02-14 LAB — COMPREHENSIVE METABOLIC PANEL
ALT: 26 U/L (ref 0–44)
AST: 13 U/L — ABNORMAL LOW (ref 15–41)
Albumin: 3.3 g/dL — ABNORMAL LOW (ref 3.5–5.0)
Alkaline Phosphatase: 47 U/L (ref 38–126)
Anion gap: 8 (ref 5–15)
BUN: 10 mg/dL (ref 8–23)
CO2: 24 mmol/L (ref 22–32)
Calcium: 9.8 mg/dL (ref 8.9–10.3)
Chloride: 103 mmol/L (ref 98–111)
Creatinine, Ser: 1.01 mg/dL — ABNORMAL HIGH (ref 0.44–1.00)
GFR calc Af Amer: >60 mL/min (ref >60)
GFR calc non Af Amer: 55 mL/min — ABNORMAL LOW (ref >60)
Glucose, Bld: 96 mg/dL (ref 70–99)
Potassium: 3.5 mmol/L (ref 3.5–5.1)
Sodium: 135 mmol/L (ref 135–145)
Total Bilirubin: 1.2 mg/dL (ref 0.3–1.2)
Total Protein: 5.3 g/dL — ABNORMAL LOW (ref 6.5–8.1)

## 2019-02-14 LAB — GASTRIN: Gastrin: 139 pg/mL — ABNORMAL HIGH (ref 0–115)

## 2019-02-14 MED ORDER — BOOST / RESOURCE BREEZE PO LIQD CUSTOM
1.0000 | Freq: Three times a day (TID) | ORAL | Status: DC
  Administered 2019-02-14 – 2019-02-17 (×10): 1 via ORAL

## 2019-02-14 MED ORDER — TRAMADOL HCL 50 MG PO TABS
50.0000 mg | ORAL_TABLET | Freq: Four times a day (QID) | ORAL | Status: DC | PRN
  Administered 2019-02-14 – 2019-02-18 (×6): 50 mg via ORAL
  Filled 2019-02-14 (×7): qty 1

## 2019-02-14 MED ORDER — PRO-STAT SUGAR FREE PO LIQD
30.0000 mL | Freq: Two times a day (BID) | ORAL | Status: DC
  Administered 2019-02-14 – 2019-02-18 (×8): 30 mL via ORAL
  Filled 2019-02-14 (×9): qty 30

## 2019-02-14 MED ORDER — FENTANYL 12 MCG/HR TD PT72
1.0000 | MEDICATED_PATCH | TRANSDERMAL | Status: DC
  Administered 2019-02-14 – 2019-02-17 (×2): 1 via TRANSDERMAL
  Filled 2019-02-14 (×2): qty 1
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
## 2019-02-14 NOTE — H&P (Deleted)
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 upon discharge in facilitating mobility and self-care at the household level. 5. Physiatrist will provide 24 hour management of medical needs as well as oversight of the therapy plan/treatment and provide guidance as appropriate regarding the interaction of the two. 6. 24 hour rehab nursing will assist in the management of  bladder management, bowel management, safety, skin/wound care, disease management, medication administration, pain management and patient education  and help integrate therapy concepts, techniques,education, etc. 7. PT will assess and treat for:pre gait, gait training, endurance , safety, equipment, neuromuscular re education  .  Goals are: independent with assistive device. 8. OT will assess and treat for  ADLs, Cognitive perceptual skills, Neuromuscular re education, safety, endurance, equipment  .  Goals are: supervision.  9. SLP will assess and treat forNA  .  Goals are: N/A. 10. Case Management and Social Worker will assess and treat for psychological issues and discharge planning. 11. Team conference will be held weekly to assess progress toward goals and to determine barriers to discharge. 12.  Patient will receive at least 3 hours of therapy per day at least 5 days per week. 13. ELOS and Prognosis: 7d excellent   "I have personally performed a face to face diagnostic evaluation of this patient.  Additionally, I have reviewed and concur with the physician assistant's documentation above."    Charlett Blake M.D. [facility] FAAPM&R (Sports Med, Neuromuscular Med) Diplomate Am Board of Electrodiagnostic Med Fellow AM Board of Interventional Pain Physicians Bary Leriche, PA-C 02/13/2019

## 2019-02-14 NOTE — IPOC Note (Addendum)
Overall Plan of Care [facility]) Patient Details Name: [name] MRN: [id number] DOB: 1945/11/03  Admitting Diagnosis: <principal problem not specified>  Hospital Problems: Active Problems:   Debility     Functional Problem List: Nursing Endurance, Nutrition  PT Balance, Sensory, Endurance, Motor, Pain  OT Balance, Endurance, Pain  SLP    TR         Basic ADL's: OT Grooming, Bathing, Dressing, Toileting     Advanced  ADL's: OT Simple Meal Preparation, Light Housekeeping     Transfers: PT Bed Mobility, Bed to Chair, Car, Furniture, Floor  OT Tub/Shower, Agricultural engineer: PT Ambulation, Stairs     Additional Impairments: OT None  SLP        TR      Anticipated Outcomes Item Anticipated Outcome  Self Feeding independent  Swallowing      Basic self-care  Supervision to modified independent level  Toileting  modified independent level   Bathroom Transfers supervision to modified independent  Bowel/Bladder  Mod I  Transfers  mod-I transfers  Locomotion  supervision ambulation with LRAD  Communication     Cognition     Pain  < 3  Safety/Judgment  Mod I   Therapy Plan: PT Intensity: Minimum of 1-2 x/day ,45 to 90 minutes PT Frequency: 5 out of 7 days PT Duration Estimated Length of Stay: 5-7 days OT Intensity: Minimum of 1-2 x/day, 45 to 90 minutes OT Frequency: 5 out of 7 days OT Duration/Estimated Length of Stay: 5-7 days     Due to the current state of emergency, patients may not be receiving their 3-hours of Medicare-mandated therapy.   Team Interventions: Nursing Interventions Patient/Family Education, Disease Management/Prevention  PT interventions Ambulation/gait training, Community reintegration, DME/adaptive equipment instruction, Neuromuscular re-education, Psychosocial support, Stair training, UE/LE Strength taining/ROM, Training and development officer, Discharge planning, Functional electrical stimulation, Pain management, Skin  care/wound management, Therapeutic Activities, UE/LE Coordination activities, Cognitive remediation/compensation, Disease management/prevention, Functional mobility training, Patient/family education, Splinting/orthotics, Therapeutic Exercise, Visual/perceptual remediation/compensation  OT Interventions Balance/vestibular training, Discharge planning, Self Care/advanced ADL retraining, Therapeutic Activities, UE/LE Coordination activities, Therapeutic Exercise, Patient/family education, Functional mobility training, Disease mangement/prevention, Pain management, Community reintegration, Engineer, drilling, Neuromuscular re-education, Psychosocial support, UE/LE Strength taining/ROM, Splinting/orthotics, Wheelchair propulsion/positioning  SLP Interventions    TR Interventions    SW/CM Interventions Discharge Planning, Psychosocial Support, Patient/Family Education   Barriers to Discharge MD  Medical stability  Nursing      PT Home environment access/layout    OT      SLP      SW       Team Discharge Planning: Destination: PT-Home ,OT- Home , SLP-  Projected Follow-up: PT-Home health PT, 24 hour supervision/assistance, OT-  Home health OT, SLP-None Projected Equipment Needs: PT-To be determined, OT- To be determined, SLP-None recommended by SLP Equipment Details: PT- , OT-  Patient/family involved in discharge planning: PT- Patient,  OT-Patient, SLP-Patient  MD ELOS: 7d Medical Rehab Prognosis:  Good Assessment:  73 year old female with history of COPD, OA, CKD, HTN who was admitted on 02/01/19 with N/V/D with significant electrolyte abnormalities and lethargy. Patient with history of chronic pain and husband had changed fentanyl patch, administered narcotics and muscle relaxer early am prior to admission. Hypokalemia and hypomagnesemia supplemented. She developed acute hypoxia with concerns of aspiration., hypotension and developed respiratory failure requiring  intubation. She tolerated extubation by 6/16 but has had bouts confusion and agitation due to delirium requiring precedex--Son in law (Dr. Cyndia Bent)  has attempted to help orient patient. . She underwent thoracocentesis of right pleural effusion--750 cc clear fluid removed. 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 She had positive troponin's due to NSTEMI. Eliquis switched to IV heparin and A fib with RVR treated with Cardizem. Repeat echo with improvement in EF to 50-55%. She underwent cardiac cath by Dr. Haroldine Laws on 6/22 revealing normal coronaries.   Dr. Havery Moros consulted due to ongoing chronic intermittent issues with N/V/D,abdominal pain and malnutrition. CT abdomen showed concerns of esophagitis and she underwent EGD done for work up 6/24 and revealed benign gastric polyp with moderate duodenitis with ulcerations. Ulcerations not felt to be entirely cause of her GI symptoms felt to be functional in nature due to chronic narcotics. low dose Reglan added, PPI increased to bid with recommendations to wean off narcotics. Heart rate improved with addition of bisoprolol and acute systolic failure treated briefly with digoxin and lasix--to resume digoxin if HR increases. She has been transitioned back to Eliquis and elevated trops felt to be due to demand ischemia. Patient has refused to discontinue fentanyl patch. She has completed 10 day course of antibiotics for aspiration PNA and respiratory status is stable. Encephalopathy has resolved but she continues to be limited by knee pain as well as debility.   Now requiring 24/7 Rehab RN,MD, as well as CIR level PT, OT .  Treatment team will focus on ADLs and mobility with goals set at Mod I   See Team Conference Notes for weekly updates to the plan of care
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
## 2019-02-14 NOTE — Progress Notes (Signed)
[facility] PHYSICAL MEDICINE & REHABILITATION PROGRESS NOTE   Subjective/Complaints:  Patient did well with physical therapy this morning.  Felt better after getting a shower.  No pain complaints this morning  Review of systems negative chest pain shortness of breath nausea vomiting diarrhea constipation  Objective:   No results found. Recent Labs    02/13/19 0412 02/14/19 0515  WBC 7.0 7.1  HGB 13.7 13.4  HCT 40.6 40.3  PLT 285 282   Recent Labs    02/12/19 0330 02/14/19 0515  NA 133* 135  K 4.2 3.5  CL 98 103  CO2 23 24  GLUCOSE 105* 96  BUN 9 10  CREATININE 0.94 1.01*  CALCIUM 9.9 9.8    Intake/Output Summary (Last 24 hours) at 02/14/2019 1255 Last data filed at 02/14/2019 0930 Gross per 24 hour  Intake 120 ml  Output -  Net 120 ml     Physical Exam: Vital Signs Blood pressure 98/60, pulse 76, temperature 97.9 F (36.6 C), temperature source Oral, resp. rate 18, height 5\' 5"  (1.651 m), weight 51.5 kg, SpO2 100 %.   General: No acute distress Mood and affect are appropriate Heart: Regular rate and rhythm no rubs murmurs or extra sounds Lungs: Clear to auscultation, breathing unlabored, no rales or wheezes Abdomen: Positive bowel sounds, soft nontender to palpation, nondistended Extremities: No clubbing, cyanosis, or edema Skin: No evidence of breakdown, no evidence of rash Neurologic: Cranial nerves II through XII intact, motor strength is 4/5 in bilateral deltoid, bicep, tricep, grip, hip flexor, knee extensors, ankle dorsiflexor and plantar flexor Sensory exam normal sensation to light touch and proprioception in bilateral upper and lower extremities Cerebellar exam normal finger to nose to finger as well as heel to shin in bilateral upper and lower extremities Musculoskeletal: Full range of motion in all 4 extremities. No joint swelling   Assessment/Plan: 1. Functional deficits secondary to debility which require 3+ hours per day of interdisciplinary  therapy in a comprehensive inpatient rehab setting.  Physiatrist is providing close team supervision and 24 hour management of active medical problems listed below.  Physiatrist and rehab team continue to assess barriers to discharge/monitor patient progress toward functional and medical goals  Care Tool:  Bathing              Bathing assist       Upper Body Dressing/Undressing Upper body dressing        Upper body assist Assist Level: Minimal Assistance - Patient > 75%    Lower Body Dressing/Undressing Lower body dressing            Lower body assist Assist for lower body dressing: Maximal Assistance - Patient 25 - 49%     Toileting Toileting    Toileting assist Assist for toileting: Minimal Assistance - Patient > 75%     Transfers Chair/bed transfer  Transfers assist     Chair/bed transfer assist level: Contact Guard/Touching assist     Locomotion Ambulation   Ambulation assist              Walk 10 feet activity   Assist           Walk 50 feet activity   Assist           Walk 150 feet activity   Assist           Walk 10 feet on uneven surface  activity   Assist           Wheelchair  Assist               Wheelchair 50 feet with 2 turns activity    Assist            Wheelchair 150 feet activity     Assist            Medical Problem List and Plan: 1. debility secondary to Pneumonia ET OT eval's today 2. Antithrombotics: -DVT/anticoagulation:Pharmaceutical:Other (comment)--Eliquis -antiplatelet therapy: N/A 3. Pain Management:Fentanyl patch 25 mcg with scheduled tylenol. Will switch to fentanyl 12 mcg and start tramadol 50 mg 4 times daily  4. Mood:LCSW to follow for evaluation and support. -antipsychotic agents: N/A 5. Neuropsych: This patientiscapable of making decisions on herown behalf. 6. Skin/Wound Care:Routine pressure relief  measures. Maintain adequate nutritional and hydration status. 7. Fluids/Electrolytes/Nutrition:Monitor I/O. Check lytes in am.  8. Aspiration PNA with leucocytosis: Has completed antibiotic regimen 6/22. Continue to monitor WBC for now.  9. Duodenitis/Chronic N/V/D: Continue Protonix BID with Reglan ac/hs--monitor for SE. 10. Acute systolic failure: Has resolved--monitor I/O and daily weights. On low dose spironolactone.  11. Chronic A fib: Monitor HR tid--on bisoprolol for rate control.  On Eliquis for chronic anticoagulation 12. Hypokalemia/Hypomagnesemia: Recheck lytes in am and serially to monitor for stability.  Potassium normal 6/26, check magnesium level 13. Protein calorie malnutrition: Continue ensure tid between meals.   LOS: 1 days A FACE TO FACE EVALUATION WAS PERFORMED  Charlett Blake 02/14/2019, 12:55 PM

## 2019-02-14 NOTE — Progress Notes (Signed)
Patient information reviewed and entered into eRehab System by Becky Demecia [place], PPS coordinator. Information including medical coding, function ability, and quality indicators will be reviewed and updated through discharge.   

## 2019-02-14 NOTE — Progress Notes (Signed)
Orthopedic Tech Progress Note Patient Details:  [name] 1946/08/13 [id number] Called in order to [facility] for a CUSTOM KNEE ORTHOSIS Patient ID: [name], female   DOB: 05-13-1946, 73 y.o.   MRN: [id number]   Janit Pagan 02/14/2019, 10:06 AM

## 2019-02-14 NOTE — Care Management Note (Signed)
[facility] Individual Statement of Services  Patient Name:  [name]  Date:  02/14/2019  Welcome to the [facility].  Our goal is to provide you with an individualized program based on your diagnosis and situation, designed to meet your specific needs.  With this comprehensive rehabilitation program, you will be expected to participate in at least 3 hours of rehabilitation therapies Monday-Friday, with modified therapy programming on the weekends.  Your rehabilitation program will include the following services:  Physical Therapy (PT), Occupational Therapy (OT), 24 hour per day rehabilitation nursing, Case Management (Social Worker), Rehabilitation Medicine, Nutrition Services and Pharmacy Services  Weekly team conferences will be held on Wednesday to discuss your progress.  Your Social Worker will talk with you frequently to get your input and to update you on team discussions.  Team conferences with you and your family in attendance may also be held.  Expected length of stay: 5-7 days  Overall anticipated outcome: supervision level  Depending on your progress and recovery, your program may change. Your Social Worker will coordinate services and will keep you informed of any changes. Your Social Worker's name and contact numbers are listed  below.  The following services may also be recommended but are not provided by the [facility]:    [place] will be made to provide these services after discharge if needed.  Arrangements include referral to agencies that provide these services.  Your insurance has been verified to be:  [place] primary doctor is:  Ravisanker Avva  Pertinent information will be shared with your doctor and your insurance company.  Social Worker:  Ovidio Kin, [place] or ([phone]  Information discussed with and copy given to patient by: Elease Hashimoto, 02/14/2019, 10:22 AM
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
## 2019-02-15 ENCOUNTER — Inpatient Hospital Stay (HOSPITAL_COMMUNITY): Payer: Medicare Other | Admitting: Speech Pathology

## 2019-02-15 ENCOUNTER — Inpatient Hospital Stay (HOSPITAL_COMMUNITY): Payer: Medicare Other | Admitting: Physical Therapy

## 2019-02-15 ENCOUNTER — Inpatient Hospital Stay (HOSPITAL_COMMUNITY): Payer: Medicare Other | Admitting: Occupational Therapy

## 2019-02-15 DIAGNOSIS — E876 Hypokalemia: Secondary | ICD-10-CM

## 2019-02-15 DIAGNOSIS — I5021 Acute systolic (congestive) heart failure: Secondary | ICD-10-CM

## 2019-02-15 MED ORDER — ONDANSETRON HCL 4 MG PO TABS
4.0000 mg | ORAL_TABLET | Freq: Three times a day (TID) | ORAL | Status: DC | PRN
  Administered 2019-02-15: 4 mg via ORAL

## 2019-02-15 NOTE — Plan of Care (Signed)
  Problem: Consults Goal: RH GENERAL PATIENT EDUCATION Description: See Patient Education module for education specifics. Outcome: Progressing Goal: Skin Care Protocol Initiated - if Braden Score 18 or less Description: If consults are not indicated, leave blank or document N/A Outcome: Progressing   Problem: RH BOWEL ELIMINATION Goal: RH STG MANAGE BOWEL WITH ASSISTANCE Description: STG Manage Bowel with min Assistance. Outcome: Progressing   Problem: RH SKIN INTEGRITY Goal: RH STG SKIN FREE OF INFECTION/BREAKDOWN Description: Patients skin will remain free from further breakdown or infection with mod I assist. Outcome: Progressing Goal: RH STG MAINTAIN SKIN INTEGRITY WITH ASSISTANCE Description: STG Maintain Skin Integrity With mod I Assistance. Outcome: Progressing   Problem: RH SAFETY Goal: RH STG ADHERE TO SAFETY PRECAUTIONS W/ASSISTANCE/DEVICE Description: STG Adhere to Safety Precautions With mod I Assistance/Device. Outcome: Progressing

## 2019-02-15 NOTE — Progress Notes (Signed)
Occupational Therapy Session Note  Patient Details  Name: [name] MRN: [id number] Date of Birth: 10-Dec-1945  Today's Date: 02/15/2019 OT Individual Time: 0962-8366 OT Individual Time Calculation (min): 24 min    Short Term Goals: Week 1:  OT Short Term Goal 1 (Week 1): LTGs equal to STGs set at modified independent to supervision level based on ELOS.  Skilled Therapeutic Interventions/Progress Updates:    Pt nauseated upon first attempt to see.  Agreed to try and come back later with nursing administering Zofran.  Therapist came back after 45 mins with pt stating she felt slightly better overall but still nauseated.  BP taking in supine with HOB slightly raised at 97/59.  She then slowly transitioned to sitting with BP at 88/64.  Attempted BP in standing but pt unable to maintain standing secondary to light headedness and nausea.  She stated that she was starting to feel more nauseas at this point and requested to lay back down.  Back in supine BP was at 99/64.  Call button and phone in reach with bed alarm in place.    Therapy Documentation Precautions:  Precautions Precautions: Fall Precaution Comments: orthostasis Restrictions Weight Bearing Restrictions: No General: General OT Amount of Missed Time: 36 Minutes Vital Signs: Therapy Vitals Pulse Rate: 68 BP: (!) 97/59 Patient Position (if appropriate): Lying Oxygen Therapy O2 Device: Nasal Cannula Pain: Pain Assessment Pain Scale: 0-10 Pain Score: 6  Pain Type: Chronic pain Pain Location: Back Pain Orientation: Lower Pain Descriptors / Indicators: Discomfort Pain Onset: With Activity Pain Intervention(s): Repositioned ADL: See Care Tool Section for some details of ADL  Therapy/Group: Individual Therapy  Bryson Palen OTR/L 02/15/2019, 9:19 AM

## 2019-02-15 NOTE — Progress Notes (Signed)
[place] PHYSICAL MEDICINE & REHABILITATION PROGRESS NOTE   Subjective/Complaints:  C/o low back pain. Waited too long to take meds  ROS: Patient denies fever, rash, sore throat, blurred vision, nausea, vomiting, diarrhea, cough, shortness of breath or chest pain,   headache, or mood change.    Objective:   No results found. Recent Labs    02/13/19 0412 02/14/19 0515  WBC 7.0 7.1  HGB 13.7 13.4  HCT 40.6 40.3  PLT 285 282   Recent Labs    02/14/19 0515  NA 135  K 3.5  CL 103  CO2 24  GLUCOSE 96  BUN 10  CREATININE 1.01*  CALCIUM 9.8    Intake/Output Summary (Last 24 hours) at 02/15/2019 1118 Last data filed at 02/15/2019 0721 Gross per 24 hour  Intake 700 ml  Output -  Net 700 ml     Physical Exam: Vital Signs Blood pressure (!) 97/59, pulse 68, temperature 97.8 F (36.6 C), temperature source Oral, resp. rate 16, height 5\' 5"  (1.651 m), weight 52 kg, SpO2 98 %.   Constitutional: No distress . Vital signs reviewed. HEENT: EOMI, oral membranes moist Neck: supple Cardiovascular: RRR without murmur. No JVD    Respiratory: CTA Bilaterally without wheezes or rales. Normal effort    GI: BS +, non-tender, non-distended  Extremities: No clubbing, cyanosis, or edema Skin: No evidence of breakdown, no evidence of rash Neurologic: Cranial nerves II through XII intact, motor strength is 4/5 in bilateral deltoid, bicep, tricep, grip, hip flexor, knee extensors, ankle dorsiflexor and plantar flexor Sensory exam normal sensation to light touch and proprioception in bilateral upper and lower extremities Cerebellar exam normal finger to nose to finger as well as heel to shin in bilateral upper and lower extremities Musculoskeletal: Full range of motion in all 4 extremities. No joint swelling   Assessment/Plan: 1. Functional deficits secondary to debility which require 3+ hours per day of interdisciplinary therapy in a comprehensive inpatient rehab  setting.  Physiatrist is providing close team supervision and 24 hour management of active medical problems listed below.  Physiatrist and rehab team continue to assess barriers to discharge/monitor patient progress toward functional and medical goals  Care Tool:  Bathing    Body parts bathed by patient: Right arm, Left arm, Chest, Abdomen, Front perineal area, Buttocks, Right upper leg, Face, Left lower leg, Right lower leg, Left upper leg         Bathing assist Assist Level: Minimal Assistance - Patient > 75%     Upper Body Dressing/Undressing Upper body dressing   What is the patient wearing?: Bra, Pull over shirt    Upper body assist Assist Level: Minimal Assistance - Patient > 75%    Lower Body Dressing/Undressing Lower body dressing      What is the patient wearing?: Underwear/pull up, Pants     Lower body assist Assist for lower body dressing: Minimal Assistance - Patient > 75%     Toileting Toileting    Toileting assist Assist for toileting: Minimal Assistance - Patient > 75%     Transfers Chair/bed transfer  Transfers assist     Chair/bed transfer assist level: Minimal Assistance - Patient > 75%     Locomotion Ambulation   Ambulation assist      Assist level: Minimal Assistance - Patient > 75% Assistive device: Walker-rolling Max distance: 10'   Walk 10 feet activity   Assist     Assist level: Minimal Assistance - Patient > 75% Assistive device: Cane-straight  Walk 50 feet activity   Assist Walk 50 feet with 2 turns activity did not occur: Safety/medical concerns         Walk 150 feet activity   Assist Walk 150 feet activity did not occur: Safety/medical concerns         Walk 10 feet on uneven surface  activity   Assist     Assist level: Minimal Assistance - Patient > 75%(up/down ramp) Assistive device: Walker-rolling   Wheelchair     Assist Will patient use wheelchair at discharge?: No(TBD but do not  anticipate pt will need w/c)             Wheelchair 50 feet with 2 turns activity    Assist            Wheelchair 150 feet activity     Assist            Medical Problem List and Plan: 1. debility secondary to Pneumonia   --Continue CIR therapies including PT, OT  2. Antithrombotics: -DVT/anticoagulation:Pharmaceutical:Other (comment)--Eliquis -antiplatelet therapy: N/A 3. Pain Management:  fentanyl 12 mcg and tramadol 50 mg 4 times daily   -kpad ordered for low back 4. Mood:LCSW to follow for evaluation and support. -antipsychotic agents: N/A 5. Neuropsych: This patientiscapable of making decisions on herown behalf. 6. Skin/Wound Care:Routine pressure relief measures. Maintain adequate nutritional and hydration status. 7. Fluids/Electrolytes/Nutrition:encourage PO  8. Aspiration PNA with leucocytosis: Has completed antibiotic regimen 6/22. Continue to monitor WBC for now.  9. Duodenitis/Chronic N/V/D: Continue Protonix BID with Reglan ac/hs--monitor for SE. 10. Acute systolic failure: Has resolved--monitor I/O and daily weights. On low dose spironolactone.    Filed Weights   02/13/19 1707 02/14/19 0500 02/15/19 0333  Weight: 53.3 kg 51.5 kg 52 kg    11. Chronic A fib: Monitor HR tid--on bisoprolol for rate control.  On Eliquis for chronic anticoagulation  12. Hypokalemia/Hypomagnesemia: Recheck lytes in am and serially to monitor for stability.  Potassium normal 6/26, mag++ 2.0 13. Mild Protein calorie malnutrition: Continue ensure tid between meals.   LOS: 2 days A FACE TO FACE EVALUATION WAS PERFORMED  Meredith Staggers 02/15/2019, 11:18 AM

## 2019-02-15 NOTE — Progress Notes (Signed)
Physical Therapy Session Note  Patient Details  Name: [name] MRN: [id number] Date of Birth: 1946-01-02  Today's Date: 02/15/2019 PT Individual Time: 1640-1736 PT Individual Time Calculation (min): 56 min   Short Term Goals: Week 1:  PT Short Term Goal 1 (Week 1): = LTGs based on ELOS  Skilled Therapeutic Interventions/Progress Updates:    Upon arrival to room pt states "it has not been a good day" reporting she woke up at [place] with severe low back pain but waited until 6AM to call nurse for medication - pt educated on not waiting but rather notifying nursing immediately if experiencing increased pain - pt verbalizes understanding. States "my stomach is just a rumbling" but reports overall her nausea is improving a little from this AM. Assessed vitals in supine L UE BP 86/56 (MAP 65), HR 72bpm; notified RN and reassessed in R UE while in supine BP 88/56 (MAP 67), HR 74bpm - RN cleared for continued therapy. Pt overall much more lethargic during session. Performed the following exercises: - supine heel slides without rest between repetitions 2x10 each LE cuing to achieve full knee extension - supine bridging 2x10 repetitions with manual facilitation for increased R LE weight bearing  Multimodal cuing throughout for proper form/technique.  Therapist donned B LE thigh high TED hose max assist for time management - pt educated on not wearing them at night. Reassessed vitals in supine BP 90/58 (MAP 68), HR 61bpm, sitting BP 103/64 (MAP 78), HR 67bpm, then standing BP 103/66 (MAP 78), HR 73bpm. Supine>sit, HOB flat and not using bedrails, with supervision. Sit>stand with min assist coming to standing. Stand pivot transfer EOB>recliner using RW with CGA for steadying. Pt deferred attempting ambulation this session.  Performed the following seated exercises:  - long arc quads 2x10 each LE - marching 2x10 each LE  - ankle DF/PF 2x20 each LE Cuing throughout for proper technique/form.  Pt left  seated in recliner, B LEs elevated, needs in reach, and seat belt alarm on.  Therapy Documentation Precautions:  Precautions Precautions: Fall Precaution Comments: orthostasis Restrictions Weight Bearing Restrictions: No  Pain: Reports pain level of 3/10 in low back and reports she has already taken pain medication - denies increase in pain throughout session.  Therapy/Group: Individual Therapy  Tawana Scale, PT, DPT 02/15/2019, 4:43 PM

## 2019-02-16 MED ORDER — ROPINIROLE HCL 1 MG PO TABS
1.0000 mg | ORAL_TABLET | Freq: Every day | ORAL | Status: DC
  Administered 2019-02-16 – 2019-02-17 (×2): 1 mg via ORAL
  Filled 2019-02-16 (×3): qty 1

## 2019-02-16 NOTE — Progress Notes (Signed)
Pt is down and depressed and asked if she could go home tomorrow, RN explained that MD and SW isnt here today to discuss with and pt still requires assistance. Pt stated she hasnt been without her husband in 33 yrs and what if this happened to him all of a sudden just like it happened to her. RN encouraged pt that staying here is helpful to monitor her health and help her get better. Pt responded that she would talk to the MD in the morning about going home. Pt is sitting in WC eating supper with lap belt in place and call bell in reach. Will cont to monitor.  Erie Noe, RN

## 2019-02-16 NOTE — Plan of Care (Signed)
  Problem: Consults Goal: RH GENERAL PATIENT EDUCATION Description: See Patient Education module for education specifics. Outcome: Progressing Goal: Skin Care Protocol Initiated - if Braden Score 18 or less Description: If consults are not indicated, leave blank or document N/A Outcome: Progressing   Problem: RH BOWEL ELIMINATION Goal: RH STG MANAGE BOWEL WITH ASSISTANCE Description: STG Manage Bowel with min Assistance. Outcome: Progressing   Problem: RH SKIN INTEGRITY Goal: RH STG SKIN FREE OF INFECTION/BREAKDOWN Description: Patients skin will remain free from further breakdown or infection with mod I assist. Outcome: Progressing Goal: RH STG MAINTAIN SKIN INTEGRITY WITH ASSISTANCE Description: STG Maintain Skin Integrity With mod I Assistance. Outcome: Progressing   Problem: RH SAFETY Goal: RH STG ADHERE TO SAFETY PRECAUTIONS W/ASSISTANCE/DEVICE Description: STG Adhere to Safety Precautions With mod I Assistance/Device. Outcome: Progressing

## 2019-02-16 NOTE — Progress Notes (Signed)
[name] PHYSICAL MEDICINE & REHABILITATION PROGRESS NOTE   Subjective/Complaints:  In good spirits. Back feeling better. Slept well ROS: Patient denies fever, rash, sore throat, blurred vision, nausea, vomiting, diarrhea, cough, shortness of breath or chest pain, joint or back pain, headache, or mood change.    Objective:   No results found. Recent Labs    02/14/19 0515  WBC 7.1  HGB 13.4  HCT 40.3  PLT 282   Recent Labs    02/14/19 0515  NA 135  K 3.5  CL 103  CO2 24  GLUCOSE 96  BUN 10  CREATININE 1.01*  CALCIUM 9.8    Intake/Output Summary (Last 24 hours) at 02/16/2019 1037 Last data filed at 02/15/2019 1900 Gross per 24 hour  Intake 120 ml  Output -  Net 120 ml     Physical Exam: Vital Signs Blood pressure 120/84, pulse 78, temperature 98.5 F (36.9 C), temperature source Oral, resp. rate 17, height 5\' 5"  (1.651 m), weight 53 kg, SpO2 98 %.   Constitutional: No distress . Vital signs reviewed. HEENT: EOMI, oral membranes moist Neck: supple Cardiovascular: RRR without murmur. No JVD    Respiratory: CTA Bilaterally without wheezes or rales. Normal effort    GI: BS +, non-tender, non-distended   Extremities: No clubbing, cyanosis, or edema Skin: No evidence of breakdown, no evidence of rash Neurologic: Cranial nerves II through XII intact, motor strength is 4/5 in bilateral deltoid, bicep, tricep, grip, hip flexor, knee extensors, ankle dorsiflexor and plantar flexor Sensory exam in tact. Musculoskeletal: Full range of motion in all 4 extremities. No joint swelling   Assessment/Plan: 1. Functional deficits secondary to debility which require 3+ hours per day of interdisciplinary therapy in a comprehensive inpatient rehab setting.  Physiatrist is providing close team supervision and 24 hour management of active medical problems listed below.  Physiatrist and rehab team continue to assess barriers to discharge/monitor patient progress toward functional  and medical goals  Care Tool:  Bathing    Body parts bathed by patient: Right arm, Left arm, Chest, Abdomen, Front perineal area, Buttocks, Right upper leg, Face, Left lower leg, Right lower leg, Left upper leg         Bathing assist Assist Level: Minimal Assistance - Patient > 75%     Upper Body Dressing/Undressing Upper body dressing   What is the patient wearing?: Button up shirt    Upper body assist Assist Level: Minimal Assistance - Patient > 75%    Lower Body Dressing/Undressing Lower body dressing      What is the patient wearing?: Underwear/pull up     Lower body assist Assist for lower body dressing: Minimal Assistance - Patient > 75%     Toileting Toileting    Toileting assist Assist for toileting: Contact Guard/Touching assist     Transfers Chair/bed transfer  Transfers assist     Chair/bed transfer assist level: Contact Guard/Touching assist     Locomotion Ambulation   Ambulation assist      Assist level: Minimal Assistance - Patient > 75% Assistive device: Walker-rolling Max distance: 10'   Walk 10 feet activity   Assist     Assist level: Minimal Assistance - Patient > 75% Assistive device: Cane-straight   Walk 50 feet activity   Assist Walk 50 feet with 2 turns activity did not occur: Safety/medical concerns         Walk 150 feet activity   Assist Walk 150 feet activity did not occur: Safety/medical concerns  Walk 10 feet on uneven surface  activity   Assist     Assist level: Minimal Assistance - Patient > 75%(up/down ramp) Assistive device: Walker-rolling   Wheelchair     Assist Will patient use wheelchair at discharge?: No(TBD but do not anticipate pt will need w/c)             Wheelchair 50 feet with 2 turns activity    Assist            Wheelchair 150 feet activity     Assist            Medical Problem List and Plan: 1. debility secondary to Pneumonia    --Continue CIR therapies including PT, OT  2. Antithrombotics: -DVT/anticoagulation:Pharmaceutical:Other (comment)--Eliquis -antiplatelet therapy: N/A 3. Pain Management:  fentanyl 12 mcg and tramadol 50 mg 4 times daily   -kpad ordered for low back  -back pain improving 4. Mood:LCSW to follow for evaluation and support. -antipsychotic agents: N/A 5. Neuropsych: This patientiscapable of making decisions on herown behalf. 6. Skin/Wound Care:Routine pressure relief measures. Maintain adequate nutritional and hydration status. 7. Fluids/Electrolytes/Nutrition:encourage PO  8. Aspiration PNA with leucocytosis: Has completed antibiotic regimen 6/22. Continue to monitor WBC for now.  9. Duodenitis/Chronic N/V/D: Continue Protonix BID with Reglan ac/hs--monitor for SE. 10. Acute systolic failure: Has resolved--monitor I/O and daily weights. On low dose spironolactone.   Weights trending up. Eating more Filed Weights   02/14/19 0500 02/15/19 0333 02/16/19 0500  Weight: 51.5 kg 52 kg 53 kg    11. Chronic A fib: Monitor HR tid--on bisoprolol for rate control.  On Eliquis for chronic anticoagulation  12. Hypokalemia/Hypomagnesemia: Recheck lytes Monday     .  Potassium normal 6/26, mag++ 2.0 13. Mild Protein calorie malnutrition: Continue ensure tid between meals.   LOS: 3 days A FACE TO FACE EVALUATION WAS PERFORMED  [name] 02/16/2019, 10:37 AM

## 2019-02-17 ENCOUNTER — Inpatient Hospital Stay (HOSPITAL_COMMUNITY): Payer: Medicare Other | Admitting: Occupational Therapy

## 2019-02-17 ENCOUNTER — Inpatient Hospital Stay (HOSPITAL_COMMUNITY): Payer: Medicare Other

## 2019-02-17 LAB — BASIC METABOLIC PANEL
Anion gap: 11 (ref 5–15)
BUN: 14 mg/dL (ref 8–23)
CO2: 20 mmol/L — ABNORMAL LOW (ref 22–32)
Calcium: 9.7 mg/dL (ref 8.9–10.3)
Chloride: 105 mmol/L (ref 98–111)
Creatinine, Ser: 0.95 mg/dL (ref 0.44–1.00)
GFR calc Af Amer: >60 mL/min (ref >60)
GFR calc non Af Amer: 59 mL/min — ABNORMAL LOW (ref >60)
Glucose, Bld: 111 mg/dL — ABNORMAL HIGH (ref 70–99)
Potassium: 4.3 mmol/L (ref 3.5–5.1)
Sodium: 136 mmol/L (ref 135–145)

## 2019-02-17 NOTE — Patient Care Conference (Signed)
Inpatient RehabilitationTeam Conference and Plan of Care Update Date: 02/18/2019   Time: 1:30 PM    Patient Name: [name]      Medical Record Number: [id number]  Date of Birth: [birth year] Sex: Female         Room/Bed: 4W18C/4W18C-01 Payor Info: Payor: MEDICARE / Plan: MEDICARE PART A AND B / Product Type: *No Product type* /    Admitting Diagnosis: 4. CVA 2 team debility, PNA, 11-13 days  Admit Date/Time:  02/13/2019  3:41 PM Admission Comments: No comment available   Primary Diagnosis:  <principal problem not specified> Principal Problem: <principal problem not specified>  Patient Active Problem List   Diagnosis Date Noted  . Debility 02/13/2019  . Nausea and vomiting   . Aspiration pneumonia ([place])   . Chronic diarrhea   . Pressure injury of skin 02/08/2019  . Pneumonia of right lower lobe due to methicillin susceptible Staphylococcus aureus (MSSA) ([place])   . Volume overload state of heart   . Acute encephalopathy 02/01/2019  . Acute respiratory failure with hypoxia ([place]) 02/01/2019  . Hypokalemia 02/01/2019  . Hypomagnesemia 02/01/2019  . Leukocytosis 02/01/2019  . Osteoarthritis 02/01/2019  . Hypothyroidism 02/01/2019  . Paroxysmal atrial fibrillation ([facility])   . Anticoagulant long-term use   . Persistent atrial fibrillation 06/27/2017  . Hip fracture ([place]) 06/27/2017  . Irregular heart rate   . Closed right hip fracture, initial encounter ([place]) 06/26/2017  . Protein-calorie malnutrition, severe 06/30/2016  . S/P laparoscopic cholecystectomy 06/28/2016  . Visual field loss following stroke   . Hypercholesterolemia   . HTN (hypertension)     Expected Discharge Date: Expected Discharge Date: 02/18/19  Team Members Present: Physician leading conference: Dr. Alger Simons Social Worker Present: Ovidio Kin, LCSW Nurse Present: Rosita Fire, RN PT Present: Michaelene Song, PT OT Present: Clyda Greener, OT SLP Present: Stormy Fabian, SLP PPS Coordinator present  : Gunnar Fusi, SLP     Current Status/Progress Goal Weekly Team Focus  Medical     ready for DC today   medical stability     Bowel/Bladder        cont B & B     Swallow/Nutrition/ Hydration             ADL's   supervision for UB bathing with min assist for UB dressing.  Supervision for LB bathing and dressing as well with supervision for shower and toilet transfers  supervision to modified independent  selfcare retraining, balance retraining, transfer training, DME education, pt'family education   Mobility     supervision-CGA    supervision-mod/i   family training  Communication             Safety/Cognition/ Behavioral Observations            Pain     pain managed   less than 3 weaning off fentanyl patch     Skin        no issues        *See Care Plan and progress notes for long and short-term goals.     Barriers to Discharge  Current Status/Progress Possible Resolutions Date Resolved   Physician                    Nursing                  PT                    OT  SLP                SW                Discharge Planning/Teaching Needs:    Home with husband who can provide 24 hr care and will come in for education prior to Dc home.     Team Discussion:  Reaching goals of supervision level-husband & daughter here for education today to learn her care. Medically stable and ready to DC home. Weaning off fentanyl patch. Pt requesting to go home-home sick  Revisions to Treatment Plan:  DC 6/30        I attest that I was present, lead the team conference, and concur with the assessment and plan of the team. Teleconference held due to COVID 19   Guiliana Shor, Gardiner Rhyme 02/18/2019, 2:37 PM

## 2019-02-17 NOTE — Progress Notes (Signed)
Physical Therapy Session Note  Patient Details  Name: [name] MRN: [id number] Date of Birth: Nov 09, 1945  Today's Date: 02/17/2019 PT Individual Time: 1000-1058 and 7829-5621 PT Individual Time Calculation (min): 58 min and 55 min    Short Term Goals: Week 1:  PT Short Term Goal 1 (Week 1): = LTGs based on ELOS  Skilled Therapeutic Interventions/Progress Updates:    Session 1: Pt supine in bed upon PT arrival, agreeable to therapy tx and reports pain 5/10 in R knee this session. Pt eager to go home today, therapist encouraged pt to stay to continue working on strength, balance and activity tolerance. Therapist also educated pt on importance on completing family education with her husbands present for therapies prior to d/c. Pt reports she is willing to stay one more night if her husband is coming in tomorrow. Pt called her husband, therapist discussed education needs with her husband and set up appointment for tomorrow 10-12p. Therapist and pt also discussed use of a transport w/c for community access secondary to decreased endurance, decreased walking tolerance and R knee pain. Pt ambulated x 10 ft to w/c with RW and CGA. Pt transported to the gym. Pt's BP WFL this session and pt with no c/o dizziness. Pt worked on step negotiation this session to ascend/descend 4 inch platform step with RW and min assist, cues for techniques and step to pattern. Pt performed seated therex this session for strengthening to perform 2 x 10 of the following exercises: LAQ, seated marches, hip abduction with green theraband, cues for techniques. Pt ambulated x 52 ft this session with RW and CGA working on endurance with ambulation. Pt worked on standing balance without UE support while performing card matching activity, supervision. Pt transported back to room and transferred to bed with CGA. Pt left supine with needs in reach and bed alarm set.   Session 2: Pt seated in w/c upon PT arrival, agreeable to therapy  tx and reports pain 6/10 in R knee. Orthotist present to assess for need of R knee brace. Pt ambulated x 30 ft with RW and CGA, pt with R knee pain and R genu valgum noted, orthotist recommending brace for R knee to control knee pain until patient is able to get knee replacement surgery. Pt transported to the gym and worked on LE strengthening this session. Pt used kinetron 3 bouts of 1 min each on 40 cm/sec for hip strengthening. Pt transported to gym and performed standing toe taps on aerobic step x 2 trials with UE support on RW, CGA. Pt performed seated hip abduction exercise, therapist provided handout and emphasized importance of hip abductor strength. Pt performed standing R LE hip abduction 2 x 10 with UE support on RW for hip strengthening. Pt transferred to mat with CGA stand pivot. Pt transferred to supine with supervision and performed 2 x 10 bridges, 2 x 10 clamshells in sidelying and 2 x 10 straight leg raises for strengthening, cues for techniques. Pt transferred to sitting with supervision and transferred to w/c with CGA. Pt transported back to room. Pt ambulated into bathroom x 10 ft with RW and CGA, pt reports she needs time for a BM. Pt left in care of NT at end of session.    Therapy Documentation Precautions:  Precautions Precautions: Fall Precaution Comments: orthostasis Restrictions Weight Bearing Restrictions: No    Therapy/Group: Individual Therapy  Netta Corrigan, PT, DPT 02/17/2019, 7:56 AM

## 2019-02-17 NOTE — Progress Notes (Signed)
Social Work Patient ID: [name], female   DOB: 02-04-1946, 73 y.o.   MRN: [id number]     Diagnosis codes:R53.81, I63.9, G93.40  Height:  5'5         Weight:  108 lbs       Patient suffers from CVA and debility   which impairs their ability to perform daily activities like ADL's   in the home.  A walker  will not resolve issue with performing activities of daily living.  A wheelchair will allow patient to safely perform daily activities.  Patient is not able to propel themselves in the home using a standard weight wheelchair due to fatigue and endurance .  A caregiver is available, willing and able to provide assistance with the wheelchair.

## 2019-02-17 NOTE — Progress Notes (Signed)
Social Work Patient ID: [name], female   DOB: 05-31-1946, 73 y.o.   MRN: [id number]  Met with pt to discuss discharge needs and plan for tomorrow. She is very pleased she will get to see husband and be able to go home tomorrow. pref [facility] since had them 2 years ago after [facility]. Will make referral to them and work on her transport chair.

## 2019-02-17 NOTE — Progress Notes (Signed)
Occupational Therapy Session Note  Patient Details  Name: [name] MRN: [id number] Date of Birth: September 10, 1945  Today's Date: 02/17/2019 OT Individual Time: 1445-1530 OT Individual Time Calculation (min): 45 min    Short Term Goals: Week 1:  OT Short Term Goal 1 (Week 1): LTGs equal to STGs set at modified independent to supervision level based on ELOS.  Skilled Therapeutic Interventions/Progress Updates:    Patient in bed, ready for therapy session with good recall of earlier session today.  Bed mobility and SPT with RW CS.  Reviewed reach and transport of objects in kitchen environment and plan for return to daily HM activities upon return home with good verbal safety and ability to teach back.  She demonstrates ability to use walker in kitchen environment with CS and min cues for technique/safety.  Completed UB conditioning activities with mild fatigue noted.  She remained in the w/c at close of session with son in law present for time off between therapy sessions.    Therapy Documentation Precautions:  Precautions Precautions: Fall Precaution Comments: orthostasis Restrictions Weight Bearing Restrictions: No General:   Vital Signs:   Pain: Pain Assessment Pain Scale: 0-10 Pain Score: 0-No pain Pain Location: Knee Pain Orientation: Right Pain Descriptors / Indicators: Aching Pain Intervention(s): Repositioned;RN made aware Other Treatments:     Therapy/Group: Individual Therapy  Carlos Levering 02/17/2019, 3:52 PM

## 2019-02-17 NOTE — Progress Notes (Signed)
[place] PHYSICAL MEDICINE & REHABILITATION PROGRESS NOTE   Subjective/Complaints:  Up in w/c at sink. "I need to get home." didn't sleep well as she was thinking about home.   ROS: Patient denies fever, rash, sore throat, blurred vision, nausea, vomiting, diarrhea, cough, shortness of breath or chest pain, joint or back pain, headache, or mood change.     Objective:   No results found. No results for input(s): WBC, HGB, HCT, PLT in the last 72 hours. Recent Labs    02/17/19 0745  NA 136  K 4.3  CL 105  CO2 20*  GLUCOSE 111*  BUN 14  CREATININE 0.95  CALCIUM 9.7    Intake/Output Summary (Last 24 hours) at 02/17/2019 1049 Last data filed at 02/17/2019 0751 Gross per 24 hour  Intake 698 ml  Output -  Net 698 ml     Physical Exam: Vital Signs Blood pressure 115/73, pulse 82, temperature 98.1 F (36.7 C), temperature source Oral, resp. rate 14, height 5\' 5"  (1.651 m), weight 52.9 kg, SpO2 99 %.   Constitutional: No distress . Vital signs reviewed. HEENT: EOMI, oral membranes moist Neck: supple Cardiovascular: RRR without murmur. No JVD    Respiratory: CTA Bilaterally without wheezes or rales. Normal effort    GI: BS +, non-tender, non-distended  Extremities: No clubbing, cyanosis, or edema Skin: No evidence of breakdown, no evidence of rash Neurologic: Cranial nerves II through XII intact, motor strength is 4/5 in bilateral deltoid, bicep, tricep, grip, hip flexor, knee extensors, ankle dorsiflexor and plantar flexor Sensory exam in tact. Musculoskeletal: full ROM   Assessment/Plan: 1. Functional deficits secondary to debility which require 3+ hours per day of interdisciplinary therapy in a comprehensive inpatient rehab setting.  Physiatrist is providing close team supervision and 24 hour management of active medical problems listed below.  Physiatrist and rehab team continue to assess barriers to discharge/monitor patient progress toward functional and medical  goals  Care Tool:  Bathing    Body parts bathed by patient: Right arm, Left arm, Chest, Abdomen, Front perineal area, Buttocks, Right upper leg, Face, Left lower leg, Right lower leg, Left upper leg         Bathing assist Assist Level: Minimal Assistance - Patient > 75%     Upper Body Dressing/Undressing Upper body dressing   What is the patient wearing?: Button up shirt    Upper body assist Assist Level: Minimal Assistance - Patient > 75%    Lower Body Dressing/Undressing Lower body dressing      What is the patient wearing?: Underwear/pull up     Lower body assist Assist for lower body dressing: Minimal Assistance - Patient > 75%     Toileting Toileting    Toileting assist Assist for toileting: Contact Guard/Touching assist     Transfers Chair/bed transfer  Transfers assist     Chair/bed transfer assist level: Contact Guard/Touching assist     Locomotion Ambulation   Ambulation assist      Assist level: Minimal Assistance - Patient > 75% Assistive device: Walker-rolling Max distance: 10'   Walk 10 feet activity   Assist     Assist level: Minimal Assistance - Patient > 75% Assistive device: Cane-straight   Walk 50 feet activity   Assist Walk 50 feet with 2 turns activity did not occur: Safety/medical concerns         Walk 150 feet activity   Assist Walk 150 feet activity did not occur: Safety/medical concerns  Walk 10 feet on uneven surface  activity   Assist     Assist level: Minimal Assistance - Patient > 75%(up/down ramp) Assistive device: Walker-rolling   Wheelchair     Assist Will patient use wheelchair at discharge?: No(TBD but do not anticipate pt will need w/c)             Wheelchair 50 feet with 2 turns activity    Assist            Wheelchair 150 feet activity     Assist            Medical Problem List and Plan: 1. debility secondary to Pneumonia   -Continue CIR  therapies including PT, OT   -home potentially tomorrow 2. Antithrombotics: -DVT/anticoagulation:Pharmaceutical:Other (comment)--Eliquis -antiplatelet therapy: N/A 3. Pain Management:  fentanyl 12 mcg and tramadol 50 mg 4 times daily   -can dc fentanyl at discharge (patch changed today----can continue this for 3 more days)  -kpad ordered for low back  -pain improved overall 4. Mood:LCSW to follow for evaluation and support. -antipsychotic agents: N/A 5. Neuropsych: This patientiscapable of making decisions on herown behalf. 6. Skin/Wound Care:Routine pressure relief measures. Maintain adequate nutritional and hydration status. 7. Fluids/Electrolytes/Nutrition:encourage PO  8. Aspiration PNA with leucocytosis: Has completed antibiotic regimen 6/22. Continue to monitor WBC for now.  9. Duodenitis/Chronic N/V/D: Continue Protonix BID with Reglan ac/hs--monitor for SE. 10. Acute systolic failure: Has resolved--monitor I/O and daily weights. On low dose spironolactone.   Weights appear stable, no volume overload on exam Filed Weights   02/15/19 0333 02/16/19 0500 02/17/19 0406  Weight: 52 kg 53 kg 52.9 kg    11. Chronic A fib: Monitor HR tid--on bisoprolol for rate control.  On Eliquis for chronic anticoagulation  12. Hypokalemia/Hypomagnesemia:    .  Potassium 4.3 today  13. Mild Protein calorie malnutrition: Continue ensure tid between meals.    -continue to encourage PO LOS: 4 days A FACE TO FACE EVALUATION WAS PERFORMED  Meredith Staggers 02/17/2019, 10:49 AM

## 2019-02-17 NOTE — Progress Notes (Signed)
Occupational Therapy Session Note  Patient Details  Name: DELANO SCARDINO MRN: [id number] Date of Birth: Jul 03, 1946  Today's Date: 02/17/2019 OT Individual Time: 4360-6770 OT Individual Time Calculation (min): 47 min    Short Term Goals: Week 1:  OT Short Term Goal 1 (Week 1): LTGs equal to STGs set at modified independent to supervision level based on ELOS.  Skilled Therapeutic Interventions/Progress Updates:    Patient seated in w/c states that she really wants to go home today.  MD and rest of therapy team aware.  She completes sit to stand & functional transfers to/from w/c and toilet using RW with CS/CGA.  Sponge bath completed at sink with CS for tasks in stance.  UB dressing with set up (except to hook bra in back), LB dressing with set up, TEDS max a, slipper socks with mod a.  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  Patient assisted to the toilet at close of session ambulating with RW CGA.  Nursing to assist off toilet when finished as she is present in room.    Therapy Documentation Precautions:  Precautions Precautions: Fall Precaution Comments: orthostasis Restrictions Weight Bearing Restrictions: No General:   Vital Signs:   Pain: Pain Assessment Pain Scale: 0-10 Pain Score: 3  Pain Location: Knee Pain Orientation: Right Pain Descriptors / Indicators: Aching Pain Intervention(s): Repositioned;RN made aware   Other Treatments:     Therapy/Group: Individual Therapy  Carlos Levering 02/17/2019, 12:57 PM

## 2019-02-17 NOTE — Progress Notes (Signed)
Social Work Patient ID: [name], female   DOB: May 18, 1946, 73 y.o.   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  A walker will not resolve issue with performing activities of daily living.  A wheelchair will allow patient to safely perform daily activities.  Patient is not able to propel themselves in the home using a standard weight wheelchair due to  Endurance and fatigue.  Patient can self propel in the lightweight wheelchair.

## 2019-02-17 NOTE — Progress Notes (Addendum)
Social Work Patient ID: [name], female   DOB: 11/10/1945, 73 y.o.   MRN: [id number] Spoke with MD who voiced she is medically ready for DC and pt is requesting discharge today. Pam-PA voiced weaning off patch and will get back with this worker regarding discharge today. Making arrangements for follow up. Emily-PT has spoken with pt and has convinced her to stay until tomorrow with family education in am with husband.

## 2019-02-18 ENCOUNTER — Encounter (HOSPITAL_COMMUNITY): Payer: Medicare Other | Admitting: Occupational Therapy

## 2019-02-18 ENCOUNTER — Ambulatory Visit (HOSPITAL_COMMUNITY): Payer: Medicare Other

## 2019-02-18 MED ORDER — ATORVASTATIN CALCIUM 10 MG PO TABS: 10.0000 mg | ORAL_TABLET | Freq: Every day | ORAL | 1 refills | Status: AC

## 2019-02-18 MED ORDER — ROPINIROLE HCL 1 MG PO TABS: 1.0000 mg | ORAL_TABLET | Freq: Every day | ORAL | 1 refills | Status: DC

## 2019-02-18 MED ORDER — APIXABAN 5 MG PO TABS: 5.0000 mg | ORAL_TABLET | Freq: Two times a day (BID) | ORAL | 1 refills | Status: DC

## 2019-02-18 MED ORDER — BISOPROLOL FUMARATE 5 MG PO TABS: 2.5000 mg | ORAL_TABLET | Freq: Every day | ORAL | 1 refills | Status: DC

## 2019-02-18 MED ORDER — TRAMADOL HCL 50 MG PO TABS: 50.0000 mg | ORAL_TABLET | Freq: Four times a day (QID) | ORAL | 0 refills | Status: DC | PRN

## 2019-02-18 MED ORDER — SPIRONOLACTONE 25 MG PO TABS: 12.5000 mg | ORAL_TABLET | Freq: Every day | ORAL | 1 refills | Status: DC

## 2019-02-18 MED ORDER — ONDANSETRON HCL 4 MG PO TABS: 4.0000 mg | ORAL_TABLET | Freq: Three times a day (TID) | ORAL | 1 refills | Status: DC | PRN

## 2019-02-18 MED ORDER — FLUOXETINE HCL 20 MG PO CAPS: 20.0000 mg | ORAL_CAPSULE | Freq: Every day | ORAL | 1 refills | Status: DC

## 2019-02-18 MED ORDER — METHOCARBAMOL 500 MG PO TABS: 500.0000 mg | ORAL_TABLET | Freq: Two times a day (BID) | ORAL | 0 refills | Status: DC | PRN

## 2019-02-18 MED ORDER — FENTANYL 12 MCG/HR TD PT72: 1.0000 | MEDICATED_PATCH | TRANSDERMAL | 0 refills | Status: DC

## 2019-02-18 MED ORDER — DICLOFENAC SODIUM 1 % TD GEL: 2.0000 g | Freq: Four times a day (QID) | TRANSDERMAL | Status: DC

## 2019-02-18 MED ORDER — SUCRALFATE 1 G PO TABS: ORAL_TABLET | ORAL | 1 refills | Status: DC

## 2019-02-18 MED ORDER — BUPROPION HCL ER (XL) 150 MG PO TB24: 150.0000 mg | ORAL_TABLET | Freq: Every day | ORAL | 1 refills | Status: DC

## 2019-02-18 MED ORDER — ACETAMINOPHEN 325 MG PO TABS: 650.0000 mg | ORAL_TABLET | Freq: Three times a day (TID) | ORAL | Status: DC

## 2019-02-18 MED ORDER — PANTOPRAZOLE SODIUM 40 MG PO TBEC: 40.0000 mg | DELAYED_RELEASE_TABLET | Freq: Two times a day (BID) | ORAL | 1 refills | Status: DC

## 2019-02-18 NOTE — Progress Notes (Signed)
Patient given discharge instructions by Algis Liming, PA. Patient wheeled down via wheelchair with all her personal belongings by nurse tech Nicholes Rough, RN

## 2019-02-18 NOTE — Discharge Instructions (Signed)
Inpatient Rehab Discharge Instructions  [name] Discharge date and time: 02/18/19    Activities/Precautions/ Functional Status: Activity: no lifting, driving, or strenuous exercise for till cleared by MD Diet: cardiac diet Wound Care: none needed   Functional status:  ___ No restrictions     ___ Walk up steps independently _X__ 24/7 supervision/assistance   ___ Walk up steps with assistance ___ Intermittent supervision/assistance  ___ Bathe/dress independently ___ Walk with walker     _X__ Bathe/dress with assistance ___ Walk Independently    ___ Shower independently ___ Walk with assistance    ___ Shower with assistance _X__ No alcohol     ___ Return to work/school ________   Special Instructions: 1. Note changes in your pain medications. Do not take fentanyl or hydrocodone that you have at home. You have been weaned off this and will need to work with your MD on risks of narcotic use.   COMMUNITY REFERRALS UPON DISCHARGE:    Home Health:   PT & OT  Agency:[facility]   [place]   Date of last service:02/18/2019  Medical Equipment/Items [place]  Agency/Supplier:[facility]   [phone]     My questions have been answered and I understand these instructions. I will adhere to these goals and the provided educational materials after my discharge from the hospital.  Patient/Caregiver Signature _______________________________ Date __________  Clinician Signature _______________________________________ Date __________  Please bring this form and your medication list with you to all your follow-up doctor's appointments.

## 2019-02-18 NOTE — Progress Notes (Signed)
[place] PHYSICAL MEDICINE & REHABILITATION PROGRESS NOTE   Subjective/Complaints:  In bed. Anxious to get home. Became very anxious when I discussed weaning off fentanyl patch. Feels that pain is worse since tapering down to 31mcg  ROS: Patient denies fever, rash, sore throat, blurred vision, nausea, vomiting, diarrhea, cough, shortness of breath or chest pain, joint or back pain, headache, or mood change.    Objective:   No results found. No results for input(s): WBC, HGB, HCT, PLT in the last 72 hours. Recent Labs    02/17/19 0745  NA 136  K 4.3  CL 105  CO2 20*  GLUCOSE 111*  BUN 14  CREATININE 0.95  CALCIUM 9.7    Intake/Output Summary (Last 24 hours) at 02/18/2019 0915 Last data filed at 02/17/2019 1856 Gross per 24 hour  Intake 444 ml  Output -  Net 444 ml     Physical Exam: Vital Signs Blood pressure 100/75, pulse 84, temperature 98.3 F (36.8 C), resp. rate 16, height 5\' 5"  (1.651 m), weight 54.6 kg, SpO2 100 %.   Constitutional: No distress . Vital signs reviewed. HEENT: EOMI, oral membranes moist Neck: supple Cardiovascular: RRR without murmur. No JVD    Respiratory: CTA Bilaterally without wheezes or rales. Normal effort    GI: BS +, non-tender, non-distended  Extremities: No clubbing, cyanosis, or edema Skin: No evidence of breakdown, no evidence of rash Neurologic: Cranial nerves II through XII intact, motor strength is 4/5 in bilateral deltoid, bicep, tricep, grip, hip flexor, knee extensors, ankle dorsiflexor and plantar flexor Sensory exam in tact. Musculoskeletal: full ROM Psych: pleasant, a little anxious  Assessment/Plan: 1. Functional deficits secondary to debility which require 3+ hours per day of interdisciplinary therapy in a comprehensive inpatient rehab setting.  Physiatrist is providing close team supervision and 24 hour management of active medical problems listed below.  Physiatrist and rehab team continue to assess barriers to  discharge/monitor patient progress toward functional and medical goals  Care Tool:  Bathing    Body parts bathed by patient: Right arm, Left arm, Chest, Abdomen, Front perineal area, Buttocks, Right upper leg, Face, Left lower leg, Right lower leg, Left upper leg         Bathing assist Assist Level: Supervision/Verbal cueing     Upper Body Dressing/Undressing Upper body dressing   What is the patient wearing?: Pull over shirt, Bra, Button up shirt    Upper body assist Assist Level: Supervision/Verbal cueing    Lower Body Dressing/Undressing Lower body dressing      What is the patient wearing?: Underwear/pull up, Pants     Lower body assist Assist for lower body dressing: Contact Guard/Touching assist     Toileting Toileting    Toileting assist Assist for toileting: Contact Guard/Touching assist     Transfers Chair/bed transfer  Transfers assist     Chair/bed transfer assist level: Contact Guard/Touching assist     Locomotion Ambulation   Ambulation assist      Assist level: Contact Guard/Touching assist Assistive device: Walker-rolling Max distance: 52 ft   Walk 10 feet activity   Assist     Assist level: Contact Guard/Touching assist Assistive device: Walker-rolling   Walk 50 feet activity   Assist Walk 50 feet with 2 turns activity did not occur: Safety/medical concerns  Assist level: Contact Guard/Touching assist Assistive device: Walker-rolling    Walk 150 feet activity   Assist Walk 150 feet activity did not occur: Safety/medical concerns  Walk 10 feet on uneven surface  activity   Assist     Assist level: Minimal Assistance - Patient > 75%(up/down ramp) Assistive device: Walker-rolling   Wheelchair     Assist Will patient use wheelchair at discharge?: No(TBD but do not anticipate pt will need w/c)             Wheelchair 50 feet with 2 turns activity    Assist            Wheelchair 150  feet activity     Assist            Medical Problem List and Plan: 1. debility secondary to Pneumonia   -Continue CIR therapies including PT, OT   -home today 2. Antithrombotics: -DVT/anticoagulation:Pharmaceutical:Other (comment)--Eliquis -antiplatelet therapy: N/A 3. Pain Management:  fentanyl 12 mcg and tramadol 50 mg 4 times daily   -after discussion with patient, I will send her home with ONE BOX of 12.25mcg fentanyl. She will need to discuss further with her primary once home.   -definite chemical dependency issues and withdrawal symptoms since decreasing from previously high dosing.  4. Mood:LCSW to follow for evaluation and support. -antipsychotic agents: N/A 5. Neuropsych: This patientiscapable of making decisions on herown behalf. 6. Skin/Wound Care:Routine pressure relief measures. Maintain adequate nutritional and hydration status. 7. Fluids/Electrolytes/Nutrition:encourage PO  8. Aspiration PNA with leucocytosis: Has completed antibiotic regimen 6/22. Continue to monitor WBC for now.  9. Duodenitis/Chronic N/V/D: Continue Protonix BID with Reglan ac/hs--monitor for SE. 10. Acute systolic failure: Has resolved--monitor I/O and daily weights. On low dose spironolactone.   Weights have been stable---continue monitoring at home [facility] Weights   02/16/19 0500 02/17/19 0406 02/18/19 0500  Weight: 53 kg 52.9 kg 54.6 kg    11. Chronic A fib: Monitor HR tid--on bisoprolol for rate control.  On Eliquis for chronic anticoagulation  12. Hypokalemia/Hypomagnesemia:    .  Potassium 4.3   13. Mild Protein calorie malnutrition: Continue ensure tid between meals.    -continue to encourage PO LOS: 5 days A FACE TO FACE EVALUATION WAS PERFORMED  Meredith Staggers 02/18/2019, 9:15 AM

## 2019-02-18 NOTE — Progress Notes (Signed)
Occupational Therapy Discharge Summary  Patient Details  Name: [name] MRN: [id number] Date of Birth: 1945-11-01  Today's Date: 02/18/2019 OT Individual Time: 1002-1055 OT Individual Time Calculation (min): 53 min   Session Note:  Pt completed dressing sitting from the EOB with min assist for UB and supervision for LB.  She was able to complete ambulation to the sink for brushing her hair with modified independent level.  Pt's family present for education.  Had her practice simulated shower transfer with spouse assisting.  She was able to complete with supervision, using small shower seat to sit on and RW to step backwards and forwards over the edge of the shower.  Also educated pt on scapular adduction exercises and shoulder row with handout provided, in order to increased sitting and standing posture.  Pt demonstrates flexed trunk and neck in standing and during mobility.  Finished session with pt up in the wheelchair with call button and phone in reach and family present.  All questions answered.    Patient has met 8 of 11 long term goals due to improved activity tolerance, improved balance and ability to compensate for deficits.  Patient to discharge at overall Supervision level.  Patient's care partner is independent and requires assistance to provide the necessary physical assistance at discharge.    Reasons goals not met: Pt needs min assist for UB dressing, supervision for all bathing, supervision to min assist for toileting tasks Recommendation:  Patient will benefit from ongoing skilled OT services in home health setting to continue to advance functional skills in the area of BADL.  Pt is currently now supervision for most selfcare tasks and functional transfers.  She will have 24 hr supervision for safety, but recommend continued HHOT for continued endurance building and progression to modified independent level for selfcare tasks.   Equipment: No equipment provided  Reasons for  discharge: treatment goals met and discharge from hospital  Patient/family agrees with progress made and goals achieved: Yes  OT Discharge Precautions/Restrictions  Precautions Precautions: Fall Precaution Comments: orthostasis Restrictions Weight Bearing Restrictions: No   Pain  No report of pain  ADL ADL Eating: Independent Where Assessed-Eating: Wheelchair Grooming: Independent Where Assessed-Grooming: Wheelchair Upper Body Bathing: Independent Where Assessed-Upper Body Bathing: Wheelchair Lower Body Bathing: Supervision/safety Where Assessed-Lower Body Bathing: Wheelchair Upper Body Dressing: Minimal assistance Where Assessed-Upper Body Dressing: Wheelchair Lower Body Dressing: Supervision/safety Where Assessed-Lower Body Dressing: Wheelchair Toileting: Supervision/safety Where Assessed-Toileting: Glass blower/designer: Diplomatic Services operational officer Method: Counselling psychologist: Geophysical data processor: Close supervision Social research officer, government Method: Heritage manager: Civil engineer, contracting with back Vision Baseline Vision/History: Wears glasses Patient Visual Report: No change from baseline Vision Assessment?: No apparent visual deficits Perception  Perception: Within Functional Limits Praxis Praxis: Intact Cognition Overall Cognitive Status: Within Functional Limits for tasks assessed Arousal/Alertness: Awake/alert Orientation Level: Oriented X4 Attention: Focused;Sustained Focused Attention: Appears intact Sustained Attention: Appears intact Memory: Appears intact Awareness: Appears intact Problem Solving: Appears intact Safety/Judgment: Appears intact Sensation Sensation Light Touch: Appears Intact Hot/Cold: Appears Intact Proprioception: Appears Intact Stereognosis: Appears Intact Coordination Gross Motor Movements are Fluid and Coordinated: Yes Fine Motor Movements are Fluid and Coordinated:  Yes(BUE coordination WFLS grossly, but with pt demonstrating decreased FM coordination secondary to arthritic families.) Motor  Motor Motor: Abnormal postural alignment and control Motor - Discharge Observations: generalized weakness Mobility  Transfers Sit to Stand: Independent with assistive device Stand to Sit: Independent with assistive device  Trunk/Postural Assessment  Cervical Assessment  Cervical Assessment: Exceptions to WFL(cervical flexion with head tilt to the left) Thoracic Assessment Thoracic Assessment: Exceptions to WFL(thoracic kyphosis) Lumbar Assessment Lumbar Assessment: Exceptions to WFL(increased posterior pelvic tilt and increased lumbar flexion at rest)  Balance Balance Balance Assessed: Yes Static Sitting Balance Static Sitting - Balance Support: Feet supported Static Sitting - Level of Assistance: 7: Independent Dynamic Sitting Balance Dynamic Sitting - Balance Support: Feet supported;During functional activity Dynamic Sitting - Level of Assistance: 6: Modified independent (Device/Increase time) Static Standing Balance Static Standing - Balance Support: During functional activity Static Standing - Level of Assistance: 6: Modified independent (Device/Increase time) Dynamic Standing Balance Dynamic Standing - Balance Support: During functional activity;Left upper extremity supported Dynamic Standing - Level of Assistance: 5: Stand by assistance Extremity/Trunk Assessment RUE Assessment RUE Assessment: Exceptions to [facility] General Strength Comments: Pt with overall strength 4/5 in shoulders and elbows.  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  Grip strength 3+/5 LUE Assessment LUE Assessment: Exceptions to [facility] General Strength Comments: Pt with overall strength 4/5 in shoulders and elbows.  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  Grip strength 3+/5   Shaquia Berkley OTR/L 02/18/2019, 4:04  PM

## 2019-02-18 NOTE — Discharge Summary (Signed)
Physician Discharge Summary  Patient ID: [name] MRN: [id number] DOB/AGE: [birth year] 73 y.o.  Admit date: 02/13/2019 Discharge date: 02/18/2019  Discharge Diagnoses:  Principal Problem:   Debility Active Problems:   Persistent atrial fibrillation   Osteoarthritis   Chronic diarrhea   Duodenitis   Chronic pain syndrome   Discharged Condition: stable   Significant Diagnostic Studies: n/A   Labs:  Basic Metabolic Panel: BMP Latest Ref Rng & Units 02/17/2019 02/14/2019 02/12/2019  Glucose 70 - 99 mg/dL 111(H) 96 105(H)  BUN 8 - 23 mg/dL 14 10 9   Creatinine 0.44 - 1.00 mg/dL 0.95 1.01(H) 0.94  BUN/Creat Ratio 12 - 28 - - -  Sodium 135 - 145 mmol/L 136 135 133(L)  Potassium 3.5 - 5.1 mmol/L 4.3 3.5 4.2  Chloride 98 - 111 mmol/L 105 103 98  CO2 22 - 32 mmol/L 20(L) 24 23  Calcium 8.9 - 10.3 mg/dL 9.7 9.8 9.9    CBC: CBC Latest Ref Rng & Units 02/14/2019 02/13/2019 02/12/2019  WBC 4.0 - 10.5 K/uL 7.1 7.0 11.7(H)  Hemoglobin 12.0 - 15.0 g/dL 13.4 13.7 14.5  Hematocrit 36.0 - 46.0 % 40.3 40.6 42.8  Platelets 150 - 400 K/uL 282 285 303    CBG: No results for input(s): GLUCAP in the last 168 hours.  Brief HPI:   [name] is a 73 year old female with history of COPD, OA, CKD, HTN, chronic pain who was admitted on 02/01/2019 with nausea, vomiting and diarrhea with significant electrolyte abnormalities and lethargy.  Hypokalemia and hypomagnesemia was supplemented.  Patient on chronic narcotics and there was question SE from narcotic leading to mental status changes.  She did develop acute hypoxia due to aspiration pneumonia as well as hypotension requiring intubation.  She tolerated extubation by 6/16 but had hospital course significant for bouts of agitation with delirium, development of right pleural effusion requiring thoracocentesis, positive troponins due to NSTEMI, brief decline in EF felt to be due to Takotsubo CM as well as A. fib with RVR.  Heart rate improved with  use of Cardizem as well as addition of low-dose bisoprolol.  She underwent cardiac cath by Dr. Kae Heller on 6/22 revealing normal coronaries and EF 55%.   Dr. Enis Gash was consulted for input on chronic  issues with intermittent nausea vomiting, diarrhea abdominal pain and malnutrition.  CT abdomen showed concerns of esophagitis and she underwent EGD revealing benign gastric polyp with moderate duodenitis with ulceration.  Low-dose Reglan was added and PPI was increased to twice daily with recommendations to wean off narcotics as symptoms felt to be in part due to chronic narcotic use.  Patient refused discontinuation of fentanyl patch.  Encephalopathy had resolved but she continued to be limited by knee pain as well as debility.  CIR was recommended due to functional decline.   Hospital Course: SHINA WASS was admitted to rehab 02/13/2019 for inpatient therapies to consist of PT and OT at least three hours five days a week. Past admission physiatrist, therapy team and rehab RN have worked together to provide customized collaborative inpatient rehab. Follow-up labs showed H&H to be stable.  Check of lytes revealed that hypokalemia and hypomagnesemia has resolved and renal status within normal limits.  Fentanyl was tapered to 12 mcg a day with tramadol 50 mg 4 times daily as needed.  Plans were to DC fentanyl at discharge however patient had high levels of anxiety regarding stopping this.  She reports issues with chronic pain and would prefer  to discuss any further changes in narcotic regimen with her primary MD.  Moods have been monitored daily and are overall stable.  Follow-up labs shows hypokalemia has resolved.  Protein supplements were added to help with low calorie malnutrition.  GI symptoms have been stable on Protonix twice daily with Reglan AC at bedtime.  She is tolerating this without any side effects. Her length of stay decreased due to patient's preference for discharge to home.  Family has  expressed concerns regarding her narcotic use and this in addition to safety, side effects and need for judicious use of narcotics was had with patient and her husband at discharge.  He is currently at supervision/CGA  level and will continue to receive further follow-up home health PT and OT by [facility] after discharge   Rehab course: During patient's stay in rehab weekly brief team conference was held to discuss progress, set goals and discuss any barriers to discharge. At admission, patient required min assist with ADL task and with mobility. She reported difficulty with bolus type sensation with eating therefore bedside swallow evaluation performed by speech therapy.  Patient was able to tolerate soft food without overt signs or symptoms of dysfunction and she does have extensive history of esophageal dysphasia with appropriate insight regarding need for dietary discretion.  Cognitive evaluation showed no signs of confusion with functional cognitive abilities. She  has had improvement in activity tolerance, balance, postural control as well as ability to compensate for deficits.  She is able to complete ADL tasks with supervision. She requires supervision for transfers and to ambulate 100 feet with use of rolling walker.  She was able to navigate 3 steps with supervision.  Climb 3 steps with rolling walker.  Family education was completed with husband and daughter.    Disposition:   Home  Diet: Heart Healthy.   Special Instructions: 1. Note decrease in fentanyl dose as well as d/c hydrocodone.   Allergies as of 02/18/2019      Reactions   Penicillins    Causes rash Has patient had a PCN reaction causing immediate rash, facial/tongue/throat swelling, SOB or lightheadedness with hypotension: YES Has patient had a PCN reaction causing severe rash involving mucus membranes or skin necrosis: No Has patient had a PCN reaction that required hospitalization No Has patient had a PCN reaction  occurring within the last 10 years: No If all of the above answers are "NO", then may proceed with Cephalosporin use.      Medication List    STOP taking these medications   buPROPion 150 MG 12 hr tablet Commonly known as: WELLBUTRIN SR Replaced by: buPROPion 150 MG 24 hr tablet   fentaNYL 25 MCG/HR Commonly known as: DURAGESIC Replaced by: fentaNYL 12 MCG/HR   levothyroxine 88 MCG tablet Commonly known as: SYNTHROID   loratadine 10 MG tablet Commonly known as: CLARITIN   losartan 25 MG tablet Commonly known as: COZAAR   VITAMIN D PO     TAKE these medications   acetaminophen 325 MG tablet Commonly known as: TYLENOL Take 2 tablets (650 mg total) by mouth every 8 (eight) hours. What changed:   medication strength  how much to take  when to take this  reasons to take this   apixaban 5 MG Tabs tablet Commonly known as: ELIQUIS Take 1 tablet (5 mg total) by mouth 2 (two) times daily.   atorvastatin 10 MG tablet Commonly known as: LIPITOR Take 1 tablet (10 mg total) by mouth daily.  bisoprolol 5 MG tablet Commonly known as: ZEBETA Take 0.5 tablets (2.5 mg total) by mouth daily.   buPROPion 150 MG 24 hr tablet Commonly known as: WELLBUTRIN XL Take 1 tablet (150 mg total) by mouth daily. Replaces: buPROPion 150 MG 12 hr tablet   colestipol 1 g tablet Commonly known as: COLESTID Take 1 tablet (1 g total) by mouth 2 (two) times daily.   diclofenac sodium 1 % Gel Commonly known as: VOLTAREN Apply 2 g topically 4 (four) times daily.   feeding supplement (ENSURE ENLIVE) Liqd Take 237 mLs by mouth 3 (three) times daily between meals.   fentaNYL 12 MCG/HR--Rx 5 patches Commonly known as: [place] 1 patch onto the skin every 3 (three) days. Replaces: fentaNYL 25 MCG/HR Notes to patient: Next change on Thursday am   FLUoxetine 20 MG capsule Commonly known as: PROZAC Take 1 capsule (20 mg total) by mouth at bedtime.   fluticasone 50 MCG/ACT nasal  spray Commonly known as: FLONASE Place 1 spray into both nostrils daily as needed for allergies.   methocarbamol 500 MG tablet Commonly known as: ROBAXIN Take 1 tablet (500 mg total) by mouth every 12 (twelve) hours as needed for muscle spasms.   ondansetron 4 MG tablet Commonly known as: ZOFRAN Take 1 tablet (4 mg total) by mouth every 8 (eight) hours as needed for nausea.   pantoprazole 40 MG tablet Commonly known as: PROTONIX Take 1 tablet (40 mg total) by mouth 2 (two) times daily.   polyethylene glycol 17 g packet Commonly known as: MIRALAX / GLYCOLAX Take 17 g by mouth daily as needed for moderate constipation or severe constipation.   rOPINIRole 1 MG tablet Commonly known as: REQUIP Take 1 tablet (1 mg total) by mouth at bedtime. What changed:   when to take this  reasons to take this   spironolactone 25 MG tablet Commonly known as: ALDACTONE Take 0.5 tablets (12.5 mg total) by mouth daily.   sucralfate 1 g tablet Commonly known as: Carafate Take 1 tablet before meals and at bedtime (4 times daily)-make into slurry   traMADol 50 MG tablet--Rx #  30 Pills  Commonly known as: ULTRAM Take 1 tablet (50 mg total) by mouth every 6 (six) hours as needed for severe pain.      Follow-up Information    Kirsteins, Luanna Salk, MD Follow up.   Specialty: Physical Medicine and Rehabilitation Why: Office will call you with follow up appointment Contact information: [place] [address] [phone]        Prince Solian, MD. Call on 02/19/2019.   Specialty: Internal Medicine Why: for post hospital follow up Contact information: [place] [address] [phone]        [place], Peter M, MD. Call on 02/19/2019.   Specialty: Cardiology Why: Call for follow up appointment Contact information: [address] [place] [address] [phone]        Jerene Bears, MD. Call on 02/19/2019.   Specialty:  Gastroenterology Why: for follow up appointment Contact information: [address] [place] [address] [phone]           Signed: Bary Leriche 02/24/2019, 5:07 PM

## 2019-02-18 NOTE — Progress Notes (Signed)
Social Work Discharge Note   The overall goal for the admission was met for:   Discharge location: Yes-HOME WITH HUSBAND WHO CAN PROVIDE 24 HR SUPERVISION  Length of Stay: Yes-5 DAYS  Discharge activity level: Yes-SUPERVISION-CGA   Home/community participation: Yes  Services provided included: MD, RD, PT, OT, RN, CM, Pharmacy and SW  Financial Services: Medicare and Private Insurance: [place]  Follow-up services arranged: Home Health: [facility]-PT & OT, DME: [facility]-TRANSPORT CHAIR and Patient/Family request agency HH: PREF HAD BEFORE, DME: NO PREF  Comments (or additional information):HUSBAND CAME ION Longfellow. BOTH FEEL COMFORTABLE WITH HER CARE AND GOING HOME  Patient/Family verbalized understanding of follow-up arrangements: Yes  Individual responsible for coordination of the follow-up plan: ROBERT-HUSBAND  Confirmed correct DME delivered: Elease Hashimoto 02/18/2019    Elease Hashimoto

## 2019-02-18 NOTE — Progress Notes (Signed)
Physical Therapy Discharge Summary  Patient Details  Name: [name] MRN: [id number] Date of Birth: Jan 02, 1946  Today's Date: 02/18/2019 PT Individual Time: 1100-1154 PT Individual Time Calculation (min): 54 min    Patient has met 11 of 11 long term goals due to improved activity tolerance, improved balance, increased strength, decreased pain and ability to compensate for deficits.  Patient to discharge at an ambulatory level Supervision.   Patient's care partner is independent to provide the necessary physical and cognitive assistance at discharge. Pt's husband and daughter have been present for and completed family education while attending therapy session.  All goals met.   Recommendation:  Patient will benefit from ongoing skilled PT services in home health setting to continue to advance safe functional mobility, address ongoing impairments in balance, strength, and minimize fall risk.  Equipment: transport w/c  Reasons for discharge: treatment goals met  Patient/family agrees with progress made and goals achieved: Yes   PT Treatment Interventions: Pt seated in w/c upon PT arrival, agreeable to therapy tx and reports R knee pain 5/10. Pt's daughter and husband present this session for family education. Therapist educated family on how to use leg rests and fold up transport chair. Pt transported to the gym. Pt ambulated x 100 ft this session with RW and supervision, educated family on having pt perform ambulation at home to work on endurance. Pt ascended/descended a platform step x3 with RW and supervision per home set up with supervision and cues for techniques. Pt transported to ortho gym and performed car transfer this session with supervision, cues for techniques. Pt ambulated across unlevel surfaces x10 ft and curb with RW and supervision. Pt given HEP handout and reviewed these exercises with the family. Pt performed bed mobility and bed<>chair transfer Mod I this session. Pt  left in w/c at end of session with family present, needs in reach.   PT Discharge Precautions/Restrictions Precautions Precautions: Fall Precaution Comments: orthostasis Restrictions Weight Bearing Restrictions: No Pain Pain Assessment Pain Scale: 0-10 Pain Score: 3  Pain Type: Chronic pain Pain Location: Back Pain Orientation: Lower Pain Descriptors / Indicators: Aching Pain Frequency: Occasional Pain Onset: With Activity Patients Stated Pain Goal: 2 Pain Intervention(s): Medication (See eMAR) Cognition Overall Cognitive Status: Within Functional Limits for tasks assessed Arousal/Alertness: Awake/alert Orientation Level: Oriented X4 Attention: Focused;Sustained Focused Attention: Appears intact Sustained Attention: Appears intact Safety/Judgment: Appears intact Sensation Sensation Light Touch: Appears Intact Peripheral sensation comments: pt reports since her R knee problems she has impaired R LE sensation from the knee down compared to the L LE Light Touch Impaired Details: Impaired RLE Proprioception: Appears Intact Coordination Gross Motor Movements are Fluid and Coordinated: No Fine Motor Movements are Fluid and Coordinated: No Coordination and Movement Description: coordination impaired secondary to R knee pain Motor  Motor Motor: Abnormal postural alignment and control Motor - Discharge Observations: generalized weakness  Mobility Bed Mobility Bed Mobility: Rolling Right;Rolling Left;Supine to Sit;Sit to Supine Rolling Right: Independent with assistive device Rolling Left: Independent with assistive device Supine to Sit: Independent with assistive device Sit to Supine: Independent with assistive device Transfers Transfers: Sit to Stand;Stand to Sit;Stand Pivot Transfers Sit to Stand: Independent with assistive device Stand to Sit: Independent with assistive device Stand Pivot Transfers: Independent with assistive device Transfer (Assistive device):  Rolling walker Locomotion  Gait Ambulation: Yes Gait Assistance: Supervision/Verbal cueing Gait Distance (Feet): 100 Feet Assistive device: Rolling walker Gait Gait: Yes Gait Pattern: Impaired Gait Pattern: Trunk flexed;Decreased step length - left;Decreased  step length - right;Decreased stride length;Poor foot clearance - right Stairs / Additional Locomotion Stairs: Yes Stairs Assistance: Supervision/Verbal cueing Stair Management Technique: One rail Left Height of Stairs: 6  Trunk/Postural Assessment  Cervical Assessment Cervical Assessment: Exceptions to WFL(cervical flexion) Thoracic Assessment Thoracic Assessment: Exceptions to WFL(kyphotic and scoliosis) Lumbar Assessment Lumbar Assessment: Exceptions to WFL(posterior pelvic tilt, assymetric pelvis) Postural Control Postural Control: Deficits on evaluation Postural Limitations: Pt with flexed posture in standing  Balance Static Sitting Balance Static Sitting - Level of Assistance: 7: Independent Dynamic Sitting Balance Dynamic Sitting - Level of Assistance: 6: Modified independent (Device/Increase time) Static Standing Balance Static Standing - Level of Assistance: 6: Modified independent (Device/Increase time) Dynamic Standing Balance Dynamic Standing - Level of Assistance: 5: Stand by assistance Extremity Assessment  RLE Assessment RLE Assessment: Exceptions to [facility] RLE Strength Right Hip Flexion: 3/5 Right Knee Flexion: 3-/5 Right Knee Extension: 4/5 Right Ankle Dorsiflexion: 4+/5 Right Ankle Plantar Flexion: 4+/5 LLE Strength Left Hip Flexion: 4-/5 Left Knee Flexion: 4-/5 Left Knee Extension: 4/5 Left Ankle Dorsiflexion: 4+/5 Left Ankle Plantar Flexion: 4+/5    Netta Corrigan, PT, DPT 02/18/2019, 11:53 AM

## 2019-02-24 DIAGNOSIS — K2980 Duodenitis without bleeding: Secondary | ICD-10-CM

## 2019-02-24 DIAGNOSIS — G894 Chronic pain syndrome: Secondary | ICD-10-CM

## 2019-02-25 DIAGNOSIS — Z9181 History of falling: Secondary | ICD-10-CM | POA: Diagnosis not present

## 2019-02-25 DIAGNOSIS — Z7901 Long term (current) use of anticoagulants: Secondary | ICD-10-CM | POA: Diagnosis not present

## 2019-02-25 DIAGNOSIS — I482 Chronic atrial fibrillation, unspecified: Secondary | ICD-10-CM | POA: Diagnosis not present

## 2019-02-25 DIAGNOSIS — E441 Mild protein-calorie malnutrition: Secondary | ICD-10-CM | POA: Diagnosis not present

## 2019-02-25 DIAGNOSIS — J189 Pneumonia, unspecified organism: Secondary | ICD-10-CM | POA: Diagnosis not present

## 2019-02-25 DIAGNOSIS — G8929 Other chronic pain: Secondary | ICD-10-CM | POA: Diagnosis not present

## 2019-02-25 DIAGNOSIS — I5021 Acute systolic (congestive) heart failure: Secondary | ICD-10-CM | POA: Diagnosis not present

## 2019-02-25 DIAGNOSIS — F419 Anxiety disorder, unspecified: Secondary | ICD-10-CM | POA: Diagnosis not present

## 2019-02-25 DIAGNOSIS — M549 Dorsalgia, unspecified: Secondary | ICD-10-CM | POA: Diagnosis not present

## 2019-02-26 ENCOUNTER — Other Ambulatory Visit: Payer: Self-pay

## 2019-02-26 ENCOUNTER — Encounter: Payer: Self-pay | Admitting: Registered Nurse

## 2019-02-26 ENCOUNTER — Telehealth: Payer: Self-pay

## 2019-02-26 ENCOUNTER — Encounter: Payer: Medicare Other | Attending: Registered Nurse | Admitting: Registered Nurse

## 2019-02-26 VITALS — BP 96/60 | HR 84 | Temp 97.7°F | Resp 12 | Ht 66.0 in | Wt 111.0 lb

## 2019-02-26 DIAGNOSIS — M1711 Unilateral primary osteoarthritis, right knee: Secondary | ICD-10-CM | POA: Diagnosis not present

## 2019-02-26 DIAGNOSIS — R5381 Other malaise: Secondary | ICD-10-CM | POA: Diagnosis not present

## 2019-02-26 DIAGNOSIS — I639 Cerebral infarction, unspecified: Secondary | ICD-10-CM | POA: Diagnosis not present

## 2019-02-26 DIAGNOSIS — I480 Paroxysmal atrial fibrillation: Secondary | ICD-10-CM | POA: Insufficient documentation
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
## 2019-02-26 NOTE — Telephone Encounter (Signed)
[name], PT/[facility] called requesting verbal orders for HHPT for 2wk6, 1wk3. Orders approved and given per discharge summary.

## 2019-02-27 DIAGNOSIS — F419 Anxiety disorder, unspecified: Secondary | ICD-10-CM | POA: Diagnosis not present

## 2019-02-27 DIAGNOSIS — E441 Mild protein-calorie malnutrition: Secondary | ICD-10-CM | POA: Diagnosis not present

## 2019-02-27 DIAGNOSIS — I5021 Acute systolic (congestive) heart failure: Secondary | ICD-10-CM | POA: Diagnosis not present

## 2019-02-27 DIAGNOSIS — M549 Dorsalgia, unspecified: Secondary | ICD-10-CM | POA: Diagnosis not present

## 2019-02-27 DIAGNOSIS — J189 Pneumonia, unspecified organism: Secondary | ICD-10-CM | POA: Diagnosis not present

## 2019-02-27 DIAGNOSIS — I482 Chronic atrial fibrillation, unspecified: Secondary | ICD-10-CM | POA: Diagnosis not present

## 2019-02-28 ENCOUNTER — Telehealth: Payer: Self-pay

## 2019-02-28 NOTE — Telephone Encounter (Signed)
Jim OT [facility] called requesting verbal orders for 1xwk X 2wks followed by 2xwk X 2wks followed by 1xwk X 1wk.  Called him back and approved verbal orders.

## 2019-03-03 ENCOUNTER — Telehealth: Payer: Self-pay | Admitting: Cardiology

## 2019-03-03 DIAGNOSIS — I482 Chronic atrial fibrillation, unspecified: Secondary | ICD-10-CM | POA: Diagnosis not present

## 2019-03-03 DIAGNOSIS — I5021 Acute systolic (congestive) heart failure: Secondary | ICD-10-CM | POA: Diagnosis not present

## 2019-03-03 DIAGNOSIS — J189 Pneumonia, unspecified organism: Secondary | ICD-10-CM | POA: Diagnosis not present

## 2019-03-03 DIAGNOSIS — F419 Anxiety disorder, unspecified: Secondary | ICD-10-CM | POA: Diagnosis not present

## 2019-03-03 DIAGNOSIS — E441 Mild protein-calorie malnutrition: Secondary | ICD-10-CM | POA: Diagnosis not present

## 2019-03-03 DIAGNOSIS — M549 Dorsalgia, unspecified: Secondary | ICD-10-CM | POA: Diagnosis not present

## 2019-03-03 NOTE — Telephone Encounter (Signed)
Pt informed that unfortunately we are currently out of samples and to check back maybe later in the week. Pt voiced understanding.

## 2019-03-03 NOTE — Telephone Encounter (Signed)
  Patient calling the office for samples of medication:   1.  What medication and dosage are you requesting samples for? apixaban (ELIQUIS) 5 MG TABS tablet  2.  Are you currently out of this medication? She has a week left

## 2019-03-04 ENCOUNTER — Telehealth: Payer: Self-pay | Admitting: Internal Medicine

## 2019-03-04 NOTE — Telephone Encounter (Signed)
Patient wants to schedule a follow up appoint from hospital stay she stated it was urgent and can not wait.

## 2019-03-04 NOTE — Telephone Encounter (Signed)
Pt states she was discharged from the hospital recently and needs to scheduled a follow-up appt. Pt had EGD done in hospital. Pt scheduled to see Dr. Hilarie Fredrickson 04/10/19@11am , pt aware of appt.

## 2019-03-05 DIAGNOSIS — F419 Anxiety disorder, unspecified: Secondary | ICD-10-CM | POA: Diagnosis not present

## 2019-03-05 DIAGNOSIS — M549 Dorsalgia, unspecified: Secondary | ICD-10-CM | POA: Diagnosis not present

## 2019-03-05 DIAGNOSIS — E441 Mild protein-calorie malnutrition: Secondary | ICD-10-CM | POA: Diagnosis not present

## 2019-03-05 DIAGNOSIS — I5021 Acute systolic (congestive) heart failure: Secondary | ICD-10-CM | POA: Diagnosis not present

## 2019-03-05 DIAGNOSIS — J189 Pneumonia, unspecified organism: Secondary | ICD-10-CM | POA: Diagnosis not present

## 2019-03-05 DIAGNOSIS — I482 Chronic atrial fibrillation, unspecified: Secondary | ICD-10-CM | POA: Diagnosis not present

## 2019-03-06 DIAGNOSIS — J189 Pneumonia, unspecified organism: Secondary | ICD-10-CM | POA: Diagnosis not present

## 2019-03-06 DIAGNOSIS — F419 Anxiety disorder, unspecified: Secondary | ICD-10-CM | POA: Diagnosis not present

## 2019-03-06 DIAGNOSIS — M549 Dorsalgia, unspecified: Secondary | ICD-10-CM | POA: Diagnosis not present

## 2019-03-06 DIAGNOSIS — I5021 Acute systolic (congestive) heart failure: Secondary | ICD-10-CM | POA: Diagnosis not present

## 2019-03-06 DIAGNOSIS — I482 Chronic atrial fibrillation, unspecified: Secondary | ICD-10-CM | POA: Diagnosis not present

## 2019-03-06 DIAGNOSIS — E441 Mild protein-calorie malnutrition: Secondary | ICD-10-CM | POA: Diagnosis not present
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
## 2019-03-10 DIAGNOSIS — J189 Pneumonia, unspecified organism: Secondary | ICD-10-CM | POA: Diagnosis not present

## 2019-03-10 DIAGNOSIS — I5021 Acute systolic (congestive) heart failure: Secondary | ICD-10-CM | POA: Diagnosis not present

## 2019-03-10 DIAGNOSIS — F419 Anxiety disorder, unspecified: Secondary | ICD-10-CM | POA: Diagnosis not present

## 2019-03-10 DIAGNOSIS — E441 Mild protein-calorie malnutrition: Secondary | ICD-10-CM | POA: Diagnosis not present

## 2019-03-10 DIAGNOSIS — M549 Dorsalgia, unspecified: Secondary | ICD-10-CM | POA: Diagnosis not present

## 2019-03-10 DIAGNOSIS — I482 Chronic atrial fibrillation, unspecified: Secondary | ICD-10-CM | POA: Diagnosis not present

## 2019-03-12 DIAGNOSIS — M549 Dorsalgia, unspecified: Secondary | ICD-10-CM | POA: Diagnosis not present

## 2019-03-12 DIAGNOSIS — I482 Chronic atrial fibrillation, unspecified: Secondary | ICD-10-CM | POA: Diagnosis not present

## 2019-03-12 DIAGNOSIS — I5021 Acute systolic (congestive) heart failure: Secondary | ICD-10-CM | POA: Diagnosis not present

## 2019-03-12 DIAGNOSIS — E441 Mild protein-calorie malnutrition: Secondary | ICD-10-CM | POA: Diagnosis not present

## 2019-03-12 DIAGNOSIS — J189 Pneumonia, unspecified organism: Secondary | ICD-10-CM | POA: Diagnosis not present

## 2019-03-12 DIAGNOSIS — F419 Anxiety disorder, unspecified: Secondary | ICD-10-CM | POA: Diagnosis not present

## 2019-03-13 ENCOUNTER — Encounter: Payer: Self-pay | Admitting: Cardiology

## 2019-03-13 ENCOUNTER — Other Ambulatory Visit: Payer: Self-pay

## 2019-03-13 ENCOUNTER — Ambulatory Visit (INDEPENDENT_AMBULATORY_CARE_PROVIDER_SITE_OTHER): Payer: Medicare Other | Admitting: Cardiology

## 2019-03-13 ENCOUNTER — Telehealth: Payer: Self-pay | Admitting: Cardiology

## 2019-03-13 VITALS — BP 82/48 | HR 54 | Temp 98.4°F | Ht 66.0 in | Wt 117.6 lb

## 2019-03-13 DIAGNOSIS — I639 Cerebral infarction, unspecified: Secondary | ICD-10-CM

## 2019-03-13 DIAGNOSIS — I700 Atherosclerosis of aorta: Secondary | ICD-10-CM

## 2019-03-13 DIAGNOSIS — M549 Dorsalgia, unspecified: Secondary | ICD-10-CM | POA: Diagnosis not present

## 2019-03-13 DIAGNOSIS — E441 Mild protein-calorie malnutrition: Secondary | ICD-10-CM | POA: Diagnosis not present

## 2019-03-13 DIAGNOSIS — I5181 Takotsubo syndrome: Secondary | ICD-10-CM | POA: Diagnosis not present

## 2019-03-13 DIAGNOSIS — I5021 Acute systolic (congestive) heart failure: Secondary | ICD-10-CM | POA: Diagnosis not present

## 2019-03-13 DIAGNOSIS — I482 Chronic atrial fibrillation, unspecified: Secondary | ICD-10-CM | POA: Diagnosis not present

## 2019-03-13 DIAGNOSIS — F419 Anxiety disorder, unspecified: Secondary | ICD-10-CM | POA: Diagnosis not present

## 2019-03-13 DIAGNOSIS — J189 Pneumonia, unspecified organism: Secondary | ICD-10-CM | POA: Diagnosis not present

## 2019-03-13 MED ORDER — LEVOTHYROXINE SODIUM 88 MCG PO TABS: 88.0000 ug | ORAL_TABLET | Freq: Every day | ORAL | Status: AC

## 2019-03-13 NOTE — Telephone Encounter (Signed)

## 2019-03-13 NOTE — Patient Instructions (Signed)
  Stop taking Bisoprolol and aldactone  Continue Eliquis  Restrict salt intake and wear compression hose.  Keep feet elevated when possible.  Follow up in 3 months

## 2019-03-17 DIAGNOSIS — J189 Pneumonia, unspecified organism: Secondary | ICD-10-CM | POA: Diagnosis not present

## 2019-03-17 DIAGNOSIS — M549 Dorsalgia, unspecified: Secondary | ICD-10-CM | POA: Diagnosis not present

## 2019-03-17 DIAGNOSIS — I5021 Acute systolic (congestive) heart failure: Secondary | ICD-10-CM | POA: Diagnosis not present

## 2019-03-17 DIAGNOSIS — I482 Chronic atrial fibrillation, unspecified: Secondary | ICD-10-CM | POA: Diagnosis not present

## 2019-03-17 DIAGNOSIS — E441 Mild protein-calorie malnutrition: Secondary | ICD-10-CM | POA: Diagnosis not present

## 2019-03-17 DIAGNOSIS — F419 Anxiety disorder, unspecified: Secondary | ICD-10-CM | POA: Diagnosis not present

## 2019-03-18 DIAGNOSIS — E441 Mild protein-calorie malnutrition: Secondary | ICD-10-CM | POA: Diagnosis not present

## 2019-03-18 DIAGNOSIS — M549 Dorsalgia, unspecified: Secondary | ICD-10-CM | POA: Diagnosis not present

## 2019-03-18 DIAGNOSIS — I5021 Acute systolic (congestive) heart failure: Secondary | ICD-10-CM | POA: Diagnosis not present

## 2019-03-18 DIAGNOSIS — I482 Chronic atrial fibrillation, unspecified: Secondary | ICD-10-CM | POA: Diagnosis not present

## 2019-03-18 DIAGNOSIS — F419 Anxiety disorder, unspecified: Secondary | ICD-10-CM | POA: Diagnosis not present

## 2019-03-18 DIAGNOSIS — J189 Pneumonia, unspecified organism: Secondary | ICD-10-CM | POA: Diagnosis not present

## 2019-03-19 DIAGNOSIS — J189 Pneumonia, unspecified organism: Secondary | ICD-10-CM | POA: Diagnosis not present

## 2019-03-19 DIAGNOSIS — I482 Chronic atrial fibrillation, unspecified: Secondary | ICD-10-CM | POA: Diagnosis not present

## 2019-03-19 DIAGNOSIS — F419 Anxiety disorder, unspecified: Secondary | ICD-10-CM | POA: Diagnosis not present

## 2019-03-19 DIAGNOSIS — I5021 Acute systolic (congestive) heart failure: Secondary | ICD-10-CM | POA: Diagnosis not present

## 2019-03-19 DIAGNOSIS — M549 Dorsalgia, unspecified: Secondary | ICD-10-CM | POA: Diagnosis not present

## 2019-03-19 DIAGNOSIS — E441 Mild protein-calorie malnutrition: Secondary | ICD-10-CM | POA: Diagnosis not present

## 2019-03-20 DIAGNOSIS — Z9181 History of falling: Secondary | ICD-10-CM | POA: Diagnosis not present

## 2019-03-20 DIAGNOSIS — E441 Mild protein-calorie malnutrition: Secondary | ICD-10-CM | POA: Diagnosis not present

## 2019-03-20 DIAGNOSIS — I482 Chronic atrial fibrillation, unspecified: Secondary | ICD-10-CM

## 2019-03-20 DIAGNOSIS — G8929 Other chronic pain: Secondary | ICD-10-CM | POA: Diagnosis not present

## 2019-03-20 DIAGNOSIS — Z7901 Long term (current) use of anticoagulants: Secondary | ICD-10-CM

## 2019-03-20 DIAGNOSIS — I5021 Acute systolic (congestive) heart failure: Secondary | ICD-10-CM | POA: Diagnosis not present

## 2019-03-20 DIAGNOSIS — F419 Anxiety disorder, unspecified: Secondary | ICD-10-CM | POA: Diagnosis not present

## 2019-03-20 DIAGNOSIS — J189 Pneumonia, unspecified organism: Secondary | ICD-10-CM | POA: Diagnosis not present

## 2019-03-20 DIAGNOSIS — M549 Dorsalgia, unspecified: Secondary | ICD-10-CM

## 2019-03-26 DIAGNOSIS — I5021 Acute systolic (congestive) heart failure: Secondary | ICD-10-CM | POA: Diagnosis not present

## 2019-03-26 DIAGNOSIS — M549 Dorsalgia, unspecified: Secondary | ICD-10-CM | POA: Diagnosis not present

## 2019-03-26 DIAGNOSIS — I482 Chronic atrial fibrillation, unspecified: Secondary | ICD-10-CM | POA: Diagnosis not present

## 2019-03-26 DIAGNOSIS — J189 Pneumonia, unspecified organism: Secondary | ICD-10-CM | POA: Diagnosis not present

## 2019-03-26 DIAGNOSIS — E441 Mild protein-calorie malnutrition: Secondary | ICD-10-CM | POA: Diagnosis not present

## 2019-03-26 DIAGNOSIS — F419 Anxiety disorder, unspecified: Secondary | ICD-10-CM | POA: Diagnosis not present

## 2019-03-27 DIAGNOSIS — F419 Anxiety disorder, unspecified: Secondary | ICD-10-CM | POA: Diagnosis not present

## 2019-03-27 DIAGNOSIS — M549 Dorsalgia, unspecified: Secondary | ICD-10-CM | POA: Diagnosis not present

## 2019-03-27 DIAGNOSIS — J189 Pneumonia, unspecified organism: Secondary | ICD-10-CM | POA: Diagnosis not present

## 2019-03-27 DIAGNOSIS — Z9181 History of falling: Secondary | ICD-10-CM | POA: Diagnosis not present

## 2019-03-27 DIAGNOSIS — I5021 Acute systolic (congestive) heart failure: Secondary | ICD-10-CM | POA: Diagnosis not present

## 2019-03-27 DIAGNOSIS — G8929 Other chronic pain: Secondary | ICD-10-CM | POA: Diagnosis not present

## 2019-03-27 DIAGNOSIS — Z7901 Long term (current) use of anticoagulants: Secondary | ICD-10-CM | POA: Diagnosis not present

## 2019-03-27 DIAGNOSIS — I482 Chronic atrial fibrillation, unspecified: Secondary | ICD-10-CM | POA: Diagnosis not present

## 2019-03-27 DIAGNOSIS — E441 Mild protein-calorie malnutrition: Secondary | ICD-10-CM | POA: Diagnosis not present

## 2019-03-28 DIAGNOSIS — E441 Mild protein-calorie malnutrition: Secondary | ICD-10-CM | POA: Diagnosis not present

## 2019-03-28 DIAGNOSIS — M549 Dorsalgia, unspecified: Secondary | ICD-10-CM | POA: Diagnosis not present

## 2019-03-28 DIAGNOSIS — F419 Anxiety disorder, unspecified: Secondary | ICD-10-CM | POA: Diagnosis not present

## 2019-03-28 DIAGNOSIS — I482 Chronic atrial fibrillation, unspecified: Secondary | ICD-10-CM | POA: Diagnosis not present

## 2019-03-28 DIAGNOSIS — I5021 Acute systolic (congestive) heart failure: Secondary | ICD-10-CM | POA: Diagnosis not present

## 2019-03-28 DIAGNOSIS — J189 Pneumonia, unspecified organism: Secondary | ICD-10-CM | POA: Diagnosis not present

## 2019-03-31 ENCOUNTER — Encounter: Payer: Self-pay | Admitting: *Deleted

## 2019-03-31 DIAGNOSIS — E441 Mild protein-calorie malnutrition: Secondary | ICD-10-CM | POA: Diagnosis not present

## 2019-03-31 DIAGNOSIS — I5021 Acute systolic (congestive) heart failure: Secondary | ICD-10-CM | POA: Diagnosis not present

## 2019-03-31 DIAGNOSIS — M549 Dorsalgia, unspecified: Secondary | ICD-10-CM | POA: Diagnosis not present

## 2019-03-31 DIAGNOSIS — I482 Chronic atrial fibrillation, unspecified: Secondary | ICD-10-CM | POA: Diagnosis not present

## 2019-03-31 DIAGNOSIS — J189 Pneumonia, unspecified organism: Secondary | ICD-10-CM | POA: Diagnosis not present

## 2019-03-31 DIAGNOSIS — F419 Anxiety disorder, unspecified: Secondary | ICD-10-CM | POA: Diagnosis not present

## 2019-04-02 DIAGNOSIS — I482 Chronic atrial fibrillation, unspecified: Secondary | ICD-10-CM | POA: Diagnosis not present

## 2019-04-02 DIAGNOSIS — J189 Pneumonia, unspecified organism: Secondary | ICD-10-CM | POA: Diagnosis not present

## 2019-04-02 DIAGNOSIS — F419 Anxiety disorder, unspecified: Secondary | ICD-10-CM | POA: Diagnosis not present

## 2019-04-02 DIAGNOSIS — I5021 Acute systolic (congestive) heart failure: Secondary | ICD-10-CM | POA: Diagnosis not present

## 2019-04-02 DIAGNOSIS — E441 Mild protein-calorie malnutrition: Secondary | ICD-10-CM | POA: Diagnosis not present

## 2019-04-02 DIAGNOSIS — M549 Dorsalgia, unspecified: Secondary | ICD-10-CM | POA: Diagnosis not present

## 2019-04-04 ENCOUNTER — Encounter: Payer: Medicare Other | Attending: Registered Nurse | Admitting: Physical Medicine & Rehabilitation

## 2019-04-04 DIAGNOSIS — I480 Paroxysmal atrial fibrillation: Secondary | ICD-10-CM | POA: Insufficient documentation

## 2019-04-04 DIAGNOSIS — M1711 Unilateral primary osteoarthritis, right knee: Secondary | ICD-10-CM | POA: Insufficient documentation

## 2019-04-04 DIAGNOSIS — R5381 Other malaise: Secondary | ICD-10-CM | POA: Insufficient documentation

## 2019-04-07 DIAGNOSIS — E441 Mild protein-calorie malnutrition: Secondary | ICD-10-CM | POA: Diagnosis not present

## 2019-04-07 DIAGNOSIS — J189 Pneumonia, unspecified organism: Secondary | ICD-10-CM | POA: Diagnosis not present

## 2019-04-07 DIAGNOSIS — I5021 Acute systolic (congestive) heart failure: Secondary | ICD-10-CM | POA: Diagnosis not present

## 2019-04-07 DIAGNOSIS — F419 Anxiety disorder, unspecified: Secondary | ICD-10-CM | POA: Diagnosis not present

## 2019-04-07 DIAGNOSIS — M549 Dorsalgia, unspecified: Secondary | ICD-10-CM | POA: Diagnosis not present

## 2019-04-07 DIAGNOSIS — I482 Chronic atrial fibrillation, unspecified: Secondary | ICD-10-CM | POA: Diagnosis not present

## 2019-04-08 ENCOUNTER — Telehealth: Payer: Self-pay | Admitting: Cardiology

## 2019-04-08 NOTE — Telephone Encounter (Signed)
New Message     Pt is calling and would like for [name] to call her back    Please call

## 2019-04-08 NOTE — Telephone Encounter (Signed)
Medication Samples have been provided to the patient.  Drug name: Eliquis       Strength: 5 mg        Qty: 2 boxes  LOT: [id number]  Exp.Date: Apr 2022

## 2019-04-10 ENCOUNTER — Ambulatory Visit: Payer: Medicare Other | Admitting: Internal Medicine

## 2019-04-11 DIAGNOSIS — Z23 Encounter for immunization: Secondary | ICD-10-CM | POA: Diagnosis not present

## 2019-04-21 DIAGNOSIS — Z79891 Long term (current) use of opiate analgesic: Secondary | ICD-10-CM | POA: Diagnosis not present

## 2019-04-21 DIAGNOSIS — M961 Postlaminectomy syndrome, not elsewhere classified: Secondary | ICD-10-CM | POA: Diagnosis not present

## 2019-04-21 DIAGNOSIS — M79605 Pain in left leg: Secondary | ICD-10-CM | POA: Diagnosis not present

## 2019-04-21 DIAGNOSIS — G894 Chronic pain syndrome: Secondary | ICD-10-CM | POA: Diagnosis not present

## 2019-04-21 DIAGNOSIS — Z79899 Other long term (current) drug therapy: Secondary | ICD-10-CM | POA: Diagnosis not present

## 2019-04-21 DIAGNOSIS — M1711 Unilateral primary osteoarthritis, right knee: Secondary | ICD-10-CM | POA: Diagnosis not present

## 2019-04-23 ENCOUNTER — Telehealth: Payer: Self-pay | Admitting: *Deleted

## 2019-04-23 ENCOUNTER — Ambulatory Visit (INDEPENDENT_AMBULATORY_CARE_PROVIDER_SITE_OTHER): Payer: Medicare Other | Admitting: Physician Assistant

## 2019-04-23 ENCOUNTER — Encounter: Payer: Self-pay | Admitting: Physician Assistant

## 2019-04-23 ENCOUNTER — Other Ambulatory Visit: Payer: Self-pay

## 2019-04-23 VITALS — BP 106/72 | HR 96 | Temp 98.8°F | Ht 62.0 in | Wt 112.5 lb

## 2019-04-23 DIAGNOSIS — I5021 Acute systolic (congestive) heart failure: Secondary | ICD-10-CM | POA: Diagnosis not present

## 2019-04-23 DIAGNOSIS — Z1211 Encounter for screening for malignant neoplasm of colon: Secondary | ICD-10-CM | POA: Diagnosis not present

## 2019-04-23 DIAGNOSIS — F419 Anxiety disorder, unspecified: Secondary | ICD-10-CM | POA: Diagnosis not present

## 2019-04-23 DIAGNOSIS — J189 Pneumonia, unspecified organism: Secondary | ICD-10-CM | POA: Diagnosis not present

## 2019-04-23 DIAGNOSIS — I639 Cerebral infarction, unspecified: Secondary | ICD-10-CM | POA: Diagnosis not present

## 2019-04-23 DIAGNOSIS — K5909 Other constipation: Secondary | ICD-10-CM | POA: Diagnosis not present

## 2019-04-23 DIAGNOSIS — K219 Gastro-esophageal reflux disease without esophagitis: Secondary | ICD-10-CM

## 2019-04-23 DIAGNOSIS — E441 Mild protein-calorie malnutrition: Secondary | ICD-10-CM | POA: Diagnosis not present

## 2019-04-23 DIAGNOSIS — K2980 Duodenitis without bleeding: Secondary | ICD-10-CM

## 2019-04-23 DIAGNOSIS — I482 Chronic atrial fibrillation, unspecified: Secondary | ICD-10-CM | POA: Diagnosis not present

## 2019-04-23 DIAGNOSIS — M549 Dorsalgia, unspecified: Secondary | ICD-10-CM | POA: Diagnosis not present

## 2019-04-23 MED ORDER — PANTOPRAZOLE SODIUM 40 MG PO TBEC: 40.0000 mg | DELAYED_RELEASE_TABLET | Freq: Two times a day (BID) | ORAL | 11 refills | Status: DC

## 2019-04-23 MED ORDER — PLENVU 140 G PO SOLR: 1.0000 | Freq: Once | ORAL | 0 refills | Status: AC

## 2019-04-23 NOTE — Patient Instructions (Signed)
You will be contacted by our office prior to your procedure for directions on holding your ELIQUIS.  If you do not hear from our office 1 week prior to your scheduled procedure, please call [phone] to discuss.   You have been scheduled for a colonoscopy. Please follow written instructions given to you at your visit today.  Please pick up your prep supplies at the pharmacy within the next 1-3 days. If you use inhalers (even only as needed), please bring them with you on the day of your procedure.   We will refill Protonix today  STOP Carafate  Start Miralax 17 gram in 8 oz water daily  If you are age 74 or older, your body mass index should be between 23-30. Your Body mass index is 20.58 kg/m. If this is out of the aforementioned range listed, please consider follow up with your Primary Care Provider.  If you are age 22 or younger, your body mass index should be between 19-25. Your Body mass index is 20.58 kg/m. If this is out of the aformentioned range listed, please consider follow up with your Primary Care Provider.

## 2019-04-23 NOTE — Telephone Encounter (Signed)
[facility] Pre-operative Risk Assessment     Request for surgical clearance:     Endoscopy Procedure  What type of surgery is being performed?     Colonoscopy   When is this surgery scheduled?     05/29/2019  What type of clearance is required ?   Pharmacy  Are there any medications that need to be held prior to surgery and how long? Eliquis   Practice name and name of physician performing surgery?      [facility]  Dr Hilarie Fredrickson   What is your office phone and fax number?      Phone- [phone]  [phone]  Anesthesia type (None, local, MAC, general) ?       MAC

## 2019-04-23 NOTE — Telephone Encounter (Signed)
Pt takes Eliquis for afib with CHADS2VASc score of 5 (age, sex, HTN, occular CVA in 2014). Renal function is normal. Recommend only holding Eliquis for 1 day prior to procedure due to history of stroke.

## 2019-04-24 ENCOUNTER — Other Ambulatory Visit: Payer: Self-pay | Admitting: Pain Medicine

## 2019-04-24 ENCOUNTER — Encounter: Payer: Self-pay | Admitting: Physician Assistant

## 2019-04-24 DIAGNOSIS — M545 Low back pain, unspecified: Secondary | ICD-10-CM

## 2019-04-24 DIAGNOSIS — M79605 Pain in left leg: Secondary | ICD-10-CM

## 2019-04-24 NOTE — Progress Notes (Signed)
Subjective:    Patient ID: [name], female    DOB: 1945/11/08, 73 y.o.   MRN: [id number]  HPI 73 year old white female, very pleasant, known to Dr. Hilarie Fredrickson who comes in today for post hospital follow-up.  She was hospitalized in June 2020 with nausea vomiting and diarrhea and also had a toxic metabolic encephalopathy felt probably secondary to chronic narcotics.  Course was complicated by aspiration pneumonia with hypoxia requiring intubation and a non-STE MI.Marland Kitchen  She had catheterization showing normal coronaries.  She was seen by GI during that admission for complaints of nausea and vomiting and underwent upper endoscopy per Dr. Havery Moros on 02/12/2019 that showed a tortuous esophagus, gastritis and moderate duodenitis with small erosions and superficial small ulcerations.  Biopsy showed a hyperplastic polyp in the stomach, there is no evidence of H. pylori and benign duodenal mucosa. She was discharged on twice daily PPI.  She was given low-dose Reglan during her admission but this was not continued. She comes in today stating that she is not having any further problems with nausea or vomiting, she feels her main abdominal discomfort at this point is due to constipation.  She has had significant problems with constipation over the past 2 to 3 months and is going 3 to 4 days between bowel movements.  She is not noted any melena or hematochezia. She is still taking Carafate and asks whether she should continue. Other medical problems include atrial fibrillation for which she is on Eliquis, hypertension, chronic pain syndrome with chronic back pain, patient had also been waiting for a right knee replacement is been delayed secondary to [place].  She has COPD without oxygen requirement chronic kidney disease and severe osteoarthritis. Last colonoscopy was done per [facility] in 2007 and was normal.  Review of Systems Pertinent positive and negative review of systems were noted in the above HPI section.   All other review of systems was otherwise negative.  Outpatient Encounter Medications as of 04/23/2019  Medication Sig  . acetaminophen (TYLENOL) 325 MG tablet Take 2 tablets (650 mg total) by mouth every 8 (eight) hours.  Marland Kitchen apixaban (ELIQUIS) 5 MG TABS tablet Take 1 tablet (5 mg total) by mouth 2 (two) times daily.  Marland Kitchen atorvastatin (LIPITOR) 10 MG tablet Take 1 tablet (10 mg total) by mouth daily.  . bisoprolol (ZEBETA) 5 MG tablet Take 0.5 tablets by mouth daily.  Marland Kitchen buPROPion (WELLBUTRIN XL) 150 MG 24 hr tablet Take 1 tablet (150 mg total) by mouth daily.  . colestipol (COLESTID) 1 g tablet Take 1 tablet (1 g total) by mouth 2 (two) times daily.  . diclofenac sodium (VOLTAREN) 1 % GEL Apply 2 g topically 4 (four) times daily.  . fentaNYL (DURAGESIC) 25 MCG/HR Place 1 patch onto the skin every 3 (three) days.  Marland Kitchen FLUoxetine (PROZAC) 20 MG capsule Take 1 capsule (20 mg total) by mouth at bedtime.  . fluticasone (FLONASE) 50 MCG/ACT nasal spray Place 1 spray into both nostrils daily as needed for allergies.   Marland Kitchen gabapentin (NEURONTIN) 100 MG capsule Take 100 mg by mouth 3 (three) times daily.  . hyoscyamine (LEVSIN SL) 0.125 MG SL tablet Take 1-2 tablets by mouth every 6 (six) hours as needed.  Marland Kitchen levothyroxine (SYNTHROID) 88 MCG tablet Take 1 tablet (88 mcg total) by mouth daily before breakfast.  . methocarbamol (ROBAXIN) 500 MG tablet Take 1 tablet (500 mg total) by mouth every 12 (twelve) hours as needed for muscle spasms.  . Nutritional Supplements (BOOST  BREEZE PO) Take 1 Can by mouth 2 (two) times daily.  . ondansetron (ZOFRAN) 4 MG tablet Take 1 tablet (4 mg total) by mouth every 8 (eight) hours as needed for nausea.  . pantoprazole (PROTONIX) 40 MG tablet Take 1 tablet (40 mg total) by mouth 2 (two) times daily.  . polyethylene glycol (MIRALAX / GLYCOLAX) 17 g packet Take 17 g by mouth daily as needed for moderate constipation or severe constipation.  Marland Kitchen rOPINIRole (REQUIP) 1 MG tablet Take 1  tablet (1 mg total) by mouth at bedtime.  Marland Kitchen spironolactone (ALDACTONE) 25 MG tablet Take 0.5 tablets by mouth daily.  . sucralfate (CARAFATE) 1 g tablet Take 1 tablet before meals and at bedtime (4 times daily)-make into slurry  . traMADol (ULTRAM) 50 MG tablet Take 1 tablet (50 mg total) by mouth every 6 (six) hours as needed for severe pain.  Marland Kitchen zolpidem (AMBIEN) 10 MG tablet Take 5 mg by mouth at bedtime as needed.  . pantoprazole (PROTONIX) 40 MG tablet Take 1 tablet (40 mg total) by mouth 2 (two) times daily.  . [EXPIRED] PEG-KCl-NaCl-NaSulf-Na Asc-C (PLENVU) 140 g SOLR Take 1 each by mouth once for 1 dose.  . [DISCONTINUED] feeding supplement, ENSURE ENLIVE, (ENSURE ENLIVE) LIQD Take 237 mLs by mouth 3 (three) times daily between meals.  . [DISCONTINUED] fentaNYL (DURAGESIC) 12 MCG/HR Place 1 patch onto the skin every 3 (three) days.   No facility-administered encounter medications on file as of 04/23/2019.    Allergies  Allergen Reactions  . Penicillins     Causes rash Has patient had a PCN reaction causing immediate rash, facial/tongue/throat swelling, SOB or lightheadedness with hypotension: YES Has patient had a PCN reaction causing severe rash involving mucus membranes or skin necrosis: No Has patient had a PCN reaction that required hospitalization No Has patient had a PCN reaction occurring within the last 10 years: No If all of the above answers are "NO", then may proceed with Cephalosporin use.    Patient Active Problem List   Diagnosis Date Noted  . Duodenitis 02/24/2019  . Chronic pain syndrome 02/24/2019  . Debility 02/13/2019  . Nausea and vomiting   . Aspiration pneumonia ([place])   . Chronic diarrhea   . Pressure injury of skin 02/08/2019  . Pneumonia of right lower lobe due to methicillin susceptible Staphylococcus aureus (MSSA) ([place])   . Volume overload state of heart   . Acute encephalopathy 02/01/2019  . Acute respiratory failure with hypoxia ([place]) 02/01/2019  .  Hypokalemia 02/01/2019  . Hypomagnesemia 02/01/2019  . Leukocytosis 02/01/2019  . Osteoarthritis 02/01/2019  . Hypothyroidism 02/01/2019  . Paroxysmal atrial fibrillation ([facility])   . Anticoagulant long-term use   . Persistent atrial fibrillation 06/27/2017  . Hip fracture ([place]) 06/27/2017  . Irregular heart rate   . Closed right hip fracture, initial encounter ([place]) 06/26/2017  . Protein-calorie malnutrition, severe 06/30/2016  . S/P laparoscopic cholecystectomy 06/28/2016  . Visual field loss following stroke   . Hypercholesterolemia   . HTN (hypertension)    Social History   Socioeconomic History  . Marital status: Married    Spouse name: Not on file  . Number of children: 3  . Years of education: Not on file  . Highest education level: Not on file  Occupational History    Employer: DISABLED  Social Needs  . Financial resource strain: Not on file  . Food insecurity    Worry: Not on file    Inability: Not on file  .  Transportation needs    Medical: Not on file    Non-medical: Not on file  Tobacco Use  . Smoking status: Former Smoker    Packs/day: 1.00    Years: 40.00    Pack years: 40.00    Types: Cigarettes    Quit date: 1999    Years since quitting: 21.6  . Smokeless tobacco: Never Used  Substance and Sexual Activity  . Alcohol use: No  . Drug use: No  . Sexual activity: Not on file  Lifestyle  . Physical activity    Days per week: Not on file    Minutes per session: Not on file  . Stress: Not on file  Relationships  . Social Herbalist on phone: Not on file    Gets together: Not on file    Attends religious service: Not on file    Active member of club or organization: Not on file    Attends meetings of clubs or organizations: Not on file    Relationship status: Not on file  . Intimate partner violence    Fear of current or ex partner: Not on file    Emotionally abused: Not on file    Physically abused: Not on file    Forced sexual  activity: Not on file  Other Topics Concern  . Not on file  Social History Narrative   Pt lives in [place] with husband.    Ms. Kult family history includes Aneurysm in her brother; Heart disease in her father and sister; Hypertension in her father.      Objective:    Vitals:   04/23/19 1338  BP: 106/72  Pulse: 96  Temp: 98.8 F (37.1 C)    Physical Exam Well-developed well-nourished, thin older white female in no acute distress.   Weight, 112 BMI 20.5 patient is wearing a back brace and right lower extremity brace  HEENT; nontraumatic normocephalic, EOMI, PE R LA, sclera anicteric. Oropharynx; not examined/wearing mask/COVID Neck; supple, no JVD Cardiovascular; irregular rate and rhythm with S1-S2, no murmur rub or gallop Pulmonary; Clear bilaterally patient is kyphotic Abdomen; soft, nontender, nondistended, no palpable mass or hepatosplenomegaly, bowel sounds are active patient is kyphotic and has minimal distance between left hip and left lower ribs.  Unable to palpate any left upper quadrant hernia or "knot" Rectal; not done today Skin; benign exam, no jaundice rash or appreciable lesions Extremities; no clubbing cyanosis or edema skin warm and dry patient wearing a knee brace on the right lower extremity Neuro/Psych; alert and oriented x4, grossly nonfocal mood and affect appropriate       Assessment & Plan:   #39 73 year old white female seen today in post hospital follow-up from June 2020.  She had a complicated hospital course, with encephalopathy, aspiration pneumonia, non-STEMI and persistent nausea and vomiting.  She was found to have moderate duodenitis with erosions and small ulcerations at EGD. Patient is doing well currently with no further complaints of nausea and vomiting, and is eating without difficulty.  No melena or hematochezia.  #2 constipation progressive over the past couple of months.  This is likely multifactorial in patient on multiple  medications  #3 colon cancer screening-last colonoscopy 2007, normal-overdue for follow-up #4 atrial fibrillation 5.  Chronic anticoagulation-on Eliquis 6.  Chronic pain syndrome with chronic narcotic use 7.  COPD no oxygen use 8.  Chronic kidney disease 9.  Osteoarthritis  Plan; patient was advised she could stop Carafate for now, okay to resume for  recurrence of symptoms. Continue Protonix 40 mg p.o. AC breakfast and AC dinner. Start MiraLAX 17 g in 8 ounces of water once daily.  She was advised to take it twice daily until she gets her bowels moving and then once daily for maintenance. Patient will be scheduled for Colonoscopy with Dr. Hilarie Fredrickson.  Procedure was discussed in detail with patient including indications risks and benefits and she is agreeable to proceed. We will need to hold Eliquis for 2 days prior to Colonoscopy.  We will communicate with her cardiologist Dr. Martinique to assure this is reasonable for this patient.  Rettie Laird S Mayia Megill PA-C 04/24/2019   Cc: Prince Solian, MD

## 2019-04-24 NOTE — Telephone Encounter (Signed)
Per Pharmacy note ok to hold 1 day prior  Pt aware

## 2019-05-05 DIAGNOSIS — E039 Hypothyroidism, unspecified: Secondary | ICD-10-CM | POA: Diagnosis not present

## 2019-05-05 DIAGNOSIS — B373 Candidiasis of vulva and vagina: Secondary | ICD-10-CM | POA: Diagnosis not present

## 2019-05-05 DIAGNOSIS — L659 Nonscarring hair loss, unspecified: Secondary | ICD-10-CM | POA: Diagnosis not present

## 2019-05-06 DIAGNOSIS — R200 Anesthesia of skin: Secondary | ICD-10-CM | POA: Diagnosis not present

## 2019-05-06 DIAGNOSIS — M545 Low back pain: Secondary | ICD-10-CM | POA: Diagnosis not present

## 2019-05-19 DIAGNOSIS — Z79899 Other long term (current) drug therapy: Secondary | ICD-10-CM | POA: Diagnosis not present

## 2019-05-19 DIAGNOSIS — M79605 Pain in left leg: Secondary | ICD-10-CM | POA: Diagnosis not present

## 2019-05-19 DIAGNOSIS — M961 Postlaminectomy syndrome, not elsewhere classified: Secondary | ICD-10-CM | POA: Diagnosis not present

## 2019-05-19 DIAGNOSIS — Z79891 Long term (current) use of opiate analgesic: Secondary | ICD-10-CM | POA: Diagnosis not present

## 2019-05-19 DIAGNOSIS — M1711 Unilateral primary osteoarthritis, right knee: Secondary | ICD-10-CM | POA: Diagnosis not present

## 2019-05-19 DIAGNOSIS — G894 Chronic pain syndrome: Secondary | ICD-10-CM | POA: Diagnosis not present

## 2019-05-20 ENCOUNTER — Ambulatory Visit
Admission: RE | Admit: 2019-05-20 | Discharge: 2019-05-20 | Disposition: A | Discharge status: Home / Self Care | Payer: Medicare Other | Source: Ambulatory Visit | Attending: Pain Medicine | Admitting: Pain Medicine

## 2019-05-20 ENCOUNTER — Other Ambulatory Visit: Payer: Self-pay

## 2019-05-20 DIAGNOSIS — M545 Low back pain, unspecified: Secondary | ICD-10-CM

## 2019-05-20 DIAGNOSIS — M79605 Pain in left leg: Secondary | ICD-10-CM

## 2019-05-21 NOTE — Progress Notes (Signed)
Addendum: Reviewed and agree with assessment and management plan. Iori Gigante M, MD  

## 2019-05-23 DIAGNOSIS — E038 Other specified hypothyroidism: Secondary | ICD-10-CM | POA: Diagnosis not present

## 2019-05-23 DIAGNOSIS — E7849 Other hyperlipidemia: Secondary | ICD-10-CM | POA: Diagnosis not present

## 2019-05-23 DIAGNOSIS — M8580 Other specified disorders of bone density and structure, unspecified site: Secondary | ICD-10-CM | POA: Diagnosis not present

## 2019-05-23 DIAGNOSIS — E559 Vitamin D deficiency, unspecified: Secondary | ICD-10-CM | POA: Diagnosis not present

## 2019-05-28 ENCOUNTER — Telehealth: Payer: Self-pay

## 2019-05-28 NOTE — Telephone Encounter (Signed)
Covid-19 screening questions   Do you now or have you had a fever in the last 14 days? NO   Do you have any respiratory symptoms of shortness of breath or cough now or in the last 14 days? NO  Do you have any family members or close contacts with diagnosed or suspected Covid-19 in the past 14 days? NO  Have you been tested for Covid-19 and found to be positive? NO        

## 2019-05-29 ENCOUNTER — Encounter: Payer: Medicare Other | Admitting: Internal Medicine

## 2019-05-30 ENCOUNTER — Telehealth: Payer: Self-pay | Admitting: *Deleted

## 2019-05-30 MED ORDER — METOCLOPRAMIDE HCL 10 MG PO TABS: 10.0000 mg | ORAL_TABLET | ORAL | 0 refills | Status: DC

## 2019-05-30 NOTE — Telephone Encounter (Signed)
-----   Message from Jerene Bears, MD sent at 05/29/2019  9:48 AM EDT ----- Regarding: FW: colonoscopy appointment Need to try a different prep with reglan or zofran before, prob the former Thanks JMP ----- Message ----- From: Hoy Register Sent: 05/29/2019   9:06 AM EDT To: Jerene Bears, MD Subject: colonoscopy appointment                        Dr. Hilarie Fredrickson, this pt was scheduled for a 10:30 AM colon today and had to cancel.  She reported that she was vomiting and "feeling so sick that she cannot take the prep."

## 2019-05-30 NOTE — Telephone Encounter (Signed)
I have spoken to patient who was extemely apologetic that she was unable to keep appointment previously for colonoscopy but indicated that she just could not stop vomiting the day of her test from the prep. I advised that we will reschedule her to 06/19/19 at 330 pm with 230 pm arrival and will attempt to use a different prep as well as Reglan prior to each half of procedure. She is in agreement with this. She indicates that she has already resumed her Eliquis as of today.   Patient will come next week when we receive Suprep samples as I have advised we will provide her with a sample since she previously purchased Plenvu and could not tolerate. I have also sent Reglan 10 mg, #2 tablets to her pharmacy. Instructions have been created and I will provide these to her when she comes to pick up suprep. She verbalizes understanding.

## 2019-06-03 ENCOUNTER — Other Ambulatory Visit: Payer: Self-pay | Admitting: Internal Medicine

## 2019-06-03 DIAGNOSIS — R82998 Other abnormal findings in urine: Secondary | ICD-10-CM | POA: Diagnosis not present

## 2019-06-03 DIAGNOSIS — R222 Localized swelling, mass and lump, trunk: Secondary | ICD-10-CM
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
## 2019-06-09 ENCOUNTER — Ambulatory Visit
Admission: RE | Admit: 2019-06-09 | Discharge: 2019-06-09 | Disposition: A | Discharge status: Home / Self Care | Payer: Medicare Other | Source: Ambulatory Visit | Attending: Internal Medicine | Admitting: Internal Medicine

## 2019-06-09 DIAGNOSIS — R222 Localized swelling, mass and lump, trunk: Secondary | ICD-10-CM

## 2019-06-12 ENCOUNTER — Ambulatory Visit (INDEPENDENT_AMBULATORY_CARE_PROVIDER_SITE_OTHER): Payer: Medicare Other | Admitting: Cardiology

## 2019-06-12 ENCOUNTER — Other Ambulatory Visit: Payer: Self-pay

## 2019-06-12 ENCOUNTER — Encounter: Payer: Self-pay | Admitting: Cardiology

## 2019-06-12 VITALS — BP 110/80 | HR 86 | Temp 97.0°F | Ht 63.0 in | Wt 115.0 lb

## 2019-06-12 DIAGNOSIS — E785 Hyperlipidemia, unspecified: Secondary | ICD-10-CM

## 2019-06-12 DIAGNOSIS — I10 Essential (primary) hypertension: Secondary | ICD-10-CM

## 2019-06-12 DIAGNOSIS — I482 Chronic atrial fibrillation, unspecified: Secondary | ICD-10-CM

## 2019-06-12 DIAGNOSIS — I5181 Takotsubo syndrome: Secondary | ICD-10-CM

## 2019-06-12 DIAGNOSIS — I639 Cerebral infarction, unspecified: Secondary | ICD-10-CM | POA: Diagnosis not present

## 2019-06-16 ENCOUNTER — Encounter (HOSPITAL_COMMUNITY): Payer: Self-pay | Admitting: Emergency Medicine

## 2019-06-16 ENCOUNTER — Observation Stay (HOSPITAL_COMMUNITY)
Admission: EM | Admit: 2019-06-16 | Discharge: 2019-06-18 | Disposition: A | Discharge status: Home / Self Care | Payer: Medicare Other | Attending: Orthopedic Surgery | Admitting: Orthopedic Surgery

## 2019-06-16 ENCOUNTER — Other Ambulatory Visit: Payer: Self-pay

## 2019-06-16 ENCOUNTER — Emergency Department (HOSPITAL_COMMUNITY): Payer: Medicare Other

## 2019-06-16 DIAGNOSIS — Z9841 Cataract extraction status, right eye: Secondary | ICD-10-CM | POA: Insufficient documentation

## 2019-06-16 DIAGNOSIS — F329 Major depressive disorder, single episode, unspecified: Secondary | ICD-10-CM | POA: Insufficient documentation

## 2019-06-16 DIAGNOSIS — Z87891 Personal history of nicotine dependence: Secondary | ICD-10-CM | POA: Diagnosis not present

## 2019-06-16 DIAGNOSIS — K589 Irritable bowel syndrome without diarrhea: Secondary | ICD-10-CM | POA: Insufficient documentation

## 2019-06-16 DIAGNOSIS — M25011 Hemarthrosis, right shoulder: Secondary | ICD-10-CM | POA: Diagnosis not present

## 2019-06-16 DIAGNOSIS — Z20828 Contact with and (suspected) exposure to other viral communicable diseases: Secondary | ICD-10-CM | POA: Insufficient documentation

## 2019-06-16 DIAGNOSIS — S43014A Anterior dislocation of right humerus, initial encounter: Principal | ICD-10-CM | POA: Diagnosis present

## 2019-06-16 DIAGNOSIS — Z9049 Acquired absence of other specified parts of digestive tract: Secondary | ICD-10-CM | POA: Insufficient documentation

## 2019-06-16 DIAGNOSIS — M810 Age-related osteoporosis without current pathological fracture: Secondary | ICD-10-CM | POA: Insufficient documentation

## 2019-06-16 DIAGNOSIS — X500XXA Overexertion from strenuous movement or load, initial encounter: Secondary | ICD-10-CM | POA: Diagnosis not present

## 2019-06-16 DIAGNOSIS — E039 Hypothyroidism, unspecified: Secondary | ICD-10-CM | POA: Diagnosis not present

## 2019-06-16 DIAGNOSIS — T148XXA Other injury of unspecified body region, initial encounter: Secondary | ICD-10-CM

## 2019-06-16 DIAGNOSIS — F1120 Opioid dependence, uncomplicated: Secondary | ICD-10-CM | POA: Diagnosis not present

## 2019-06-16 DIAGNOSIS — Z79899 Other long term (current) drug therapy: Secondary | ICD-10-CM | POA: Diagnosis not present

## 2019-06-16 DIAGNOSIS — K219 Gastro-esophageal reflux disease without esophagitis: Secondary | ICD-10-CM | POA: Insufficient documentation

## 2019-06-16 DIAGNOSIS — Z981 Arthrodesis status: Secondary | ICD-10-CM | POA: Insufficient documentation

## 2019-06-16 DIAGNOSIS — E7800 Pure hypercholesterolemia, unspecified: Secondary | ICD-10-CM | POA: Diagnosis not present

## 2019-06-16 DIAGNOSIS — M545 Low back pain: Secondary | ICD-10-CM | POA: Insufficient documentation

## 2019-06-16 DIAGNOSIS — I10 Essential (primary) hypertension: Secondary | ICD-10-CM | POA: Diagnosis not present

## 2019-06-16 DIAGNOSIS — K222 Esophageal obstruction: Secondary | ICD-10-CM | POA: Insufficient documentation

## 2019-06-16 DIAGNOSIS — K449 Diaphragmatic hernia without obstruction or gangrene: Secondary | ICD-10-CM | POA: Insufficient documentation

## 2019-06-16 DIAGNOSIS — M1990 Unspecified osteoarthritis, unspecified site: Secondary | ICD-10-CM | POA: Insufficient documentation

## 2019-06-16 DIAGNOSIS — Z79891 Long term (current) use of opiate analgesic: Secondary | ICD-10-CM | POA: Diagnosis not present

## 2019-06-16 DIAGNOSIS — Z9842 Cataract extraction status, left eye: Secondary | ICD-10-CM | POA: Insufficient documentation

## 2019-06-16 DIAGNOSIS — Z961 Presence of intraocular lens: Secondary | ICD-10-CM | POA: Insufficient documentation

## 2019-06-16 DIAGNOSIS — J449 Chronic obstructive pulmonary disease, unspecified: Secondary | ICD-10-CM | POA: Insufficient documentation

## 2019-06-16 DIAGNOSIS — M25511 Pain in right shoulder: Secondary | ICD-10-CM | POA: Diagnosis present

## 2019-06-16 DIAGNOSIS — Z90710 Acquired absence of both cervix and uterus: Secondary | ICD-10-CM | POA: Insufficient documentation

## 2019-06-16 DIAGNOSIS — Z880 Allergy status to penicillin: Secondary | ICD-10-CM | POA: Insufficient documentation

## 2019-06-16 DIAGNOSIS — N189 Chronic kidney disease, unspecified: Secondary | ICD-10-CM | POA: Insufficient documentation

## 2019-06-16 DIAGNOSIS — Z791 Long term (current) use of non-steroidal anti-inflammatories (NSAID): Secondary | ICD-10-CM | POA: Diagnosis not present

## 2019-06-16 DIAGNOSIS — M961 Postlaminectomy syndrome, not elsewhere classified: Secondary | ICD-10-CM | POA: Diagnosis not present

## 2019-06-16 DIAGNOSIS — S43004A Unspecified dislocation of right shoulder joint, initial encounter: Secondary | ICD-10-CM

## 2019-06-16 DIAGNOSIS — Z905 Acquired absence of kidney: Secondary | ICD-10-CM | POA: Insufficient documentation

## 2019-06-16 DIAGNOSIS — G8929 Other chronic pain: Secondary | ICD-10-CM | POA: Diagnosis not present

## 2019-06-16 DIAGNOSIS — R208 Other disturbances of skin sensation: Secondary | ICD-10-CM | POA: Diagnosis not present

## 2019-06-16 DIAGNOSIS — I129 Hypertensive chronic kidney disease with stage 1 through stage 4 chronic kidney disease, or unspecified chronic kidney disease: Secondary | ICD-10-CM | POA: Insufficient documentation

## 2019-06-16 DIAGNOSIS — I69312 Visuospatial deficit and spatial neglect following cerebral infarction: Secondary | ICD-10-CM | POA: Insufficient documentation

## 2019-06-16 DIAGNOSIS — E46 Unspecified protein-calorie malnutrition: Secondary | ICD-10-CM | POA: Insufficient documentation

## 2019-06-16 DIAGNOSIS — I480 Paroxysmal atrial fibrillation: Secondary | ICD-10-CM | POA: Insufficient documentation

## 2019-06-16 DIAGNOSIS — Z96641 Presence of right artificial hip joint: Secondary | ICD-10-CM | POA: Insufficient documentation

## 2019-06-16 DIAGNOSIS — Z7901 Long term (current) use of anticoagulants: Secondary | ICD-10-CM | POA: Diagnosis not present

## 2019-06-16 DIAGNOSIS — G43909 Migraine, unspecified, not intractable, without status migrainosus: Secondary | ICD-10-CM | POA: Insufficient documentation

## 2019-06-16 DIAGNOSIS — Z8249 Family history of ischemic heart disease and other diseases of the circulatory system: Secondary | ICD-10-CM | POA: Insufficient documentation

## 2019-06-16 DIAGNOSIS — G894 Chronic pain syndrome: Secondary | ICD-10-CM | POA: Diagnosis not present

## 2019-06-16 DIAGNOSIS — Z03818 Encounter for observation for suspected exposure to other biological agents ruled out: Secondary | ICD-10-CM | POA: Diagnosis not present

## 2019-06-16 DIAGNOSIS — M79605 Pain in left leg: Secondary | ICD-10-CM | POA: Diagnosis not present

## 2019-06-16 MED ORDER — MORPHINE SULFATE (PF) 4 MG/ML IV SOLN
4.0000 mg | Freq: Once | INTRAVENOUS | Status: AC
  Administered 2019-06-17: 4 mg via INTRAVENOUS
  Filled 2019-06-16: qty 1

## 2019-06-16 MED ORDER — SODIUM CHLORIDE 0.9 % IV BOLUS
1000.0000 mL | Freq: Once | INTRAVENOUS | Status: AC
  Administered 2019-06-17: 1000 mL via INTRAVENOUS

## 2019-06-16 MED ORDER — PROPOFOL 10 MG/ML IV BOLUS
0.5000 mg/kg | Freq: Once | INTRAVENOUS | Status: AC
  Administered 2019-06-17: 01:00:00 via INTRAVENOUS
  Filled 2019-06-16 (×2): qty 20

## 2019-06-16 NOTE — ED Triage Notes (Signed)
Pt reports being out earlier today and turned and felt right shoulder pop and pain radiates down right arm.
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
## 2019-06-17 ENCOUNTER — Inpatient Hospital Stay (HOSPITAL_COMMUNITY): Payer: Medicare Other

## 2019-06-17 ENCOUNTER — Emergency Department (HOSPITAL_COMMUNITY): Payer: Medicare Other

## 2019-06-17 ENCOUNTER — Encounter (HOSPITAL_COMMUNITY): Payer: Self-pay | Admitting: Certified Registered"

## 2019-06-17 ENCOUNTER — Observation Stay (HOSPITAL_COMMUNITY): Payer: Medicare Other

## 2019-06-17 ENCOUNTER — Inpatient Hospital Stay (HOSPITAL_COMMUNITY): Payer: Medicare Other | Admitting: Certified Registered"

## 2019-06-17 ENCOUNTER — Encounter (HOSPITAL_COMMUNITY)
Admission: EM | Disposition: A | Discharge status: Home / Self Care | Payer: Self-pay | Source: Home / Self Care | Attending: Emergency Medicine

## 2019-06-17 ENCOUNTER — Telehealth: Payer: Self-pay

## 2019-06-17 DIAGNOSIS — S46011A Strain of muscle(s) and tendon(s) of the rotator cuff of right shoulder, initial encounter: Secondary | ICD-10-CM | POA: Diagnosis not present

## 2019-06-17 DIAGNOSIS — E039 Hypothyroidism, unspecified: Secondary | ICD-10-CM | POA: Diagnosis not present

## 2019-06-17 DIAGNOSIS — S43014A Anterior dislocation of right humerus, initial encounter: Secondary | ICD-10-CM | POA: Diagnosis not present

## 2019-06-17 DIAGNOSIS — J449 Chronic obstructive pulmonary disease, unspecified: Secondary | ICD-10-CM | POA: Diagnosis not present

## 2019-06-17 DIAGNOSIS — Z4789 Encounter for other orthopedic aftercare: Secondary | ICD-10-CM | POA: Diagnosis not present

## 2019-06-17 DIAGNOSIS — I10 Essential (primary) hypertension: Secondary | ICD-10-CM | POA: Diagnosis not present

## 2019-06-17 DIAGNOSIS — I480 Paroxysmal atrial fibrillation: Secondary | ICD-10-CM | POA: Diagnosis not present

## 2019-06-17 DIAGNOSIS — S43081A Other subluxation of right shoulder joint, initial encounter: Secondary | ICD-10-CM | POA: Diagnosis not present

## 2019-06-17 DIAGNOSIS — E7800 Pure hypercholesterolemia, unspecified: Secondary | ICD-10-CM | POA: Diagnosis not present

## 2019-06-17 DIAGNOSIS — M25011 Hemarthrosis, right shoulder: Secondary | ICD-10-CM | POA: Diagnosis not present

## 2019-06-17 DIAGNOSIS — S43004A Unspecified dislocation of right shoulder joint, initial encounter: Secondary | ICD-10-CM | POA: Diagnosis not present

## 2019-06-17 DIAGNOSIS — M25511 Pain in right shoulder: Secondary | ICD-10-CM | POA: Diagnosis present

## 2019-06-17 HISTORY — PX: SHOULDER CLOSED REDUCTION: SHX1051

## 2019-06-17 LAB — POCT I-STAT EG7
Acid-Base Excess: 5 mmol/L — ABNORMAL HIGH (ref 0.0–2.0)
Bicarbonate: 29.9 mmol/L — ABNORMAL HIGH (ref 20.0–28.0)
Calcium, Ion: 1.07 mmol/L — ABNORMAL LOW (ref 1.15–1.40)
HCT: 47 % — ABNORMAL HIGH (ref 36.0–46.0)
Hemoglobin: 16 g/dL — ABNORMAL HIGH (ref 12.0–15.0)
O2 Saturation: 60 %
Potassium: 3.4 mmol/L — ABNORMAL LOW (ref 3.5–5.1)
Sodium: 136 mmol/L (ref 135–145)
TCO2: 31 mmol/L (ref 22–32)
pCO2, Ven: 45.9 mmHg (ref 44.0–60.0)
pH, Ven: 7.422 (ref 7.250–7.430)
pO2, Ven: 31 mmHg — CL (ref 32.0–45.0)

## 2019-06-17 LAB — SARS CORONAVIRUS 2 BY RT PCR (HOSPITAL ORDER, PERFORMED IN ~~LOC~~ HOSPITAL LAB): SARS Coronavirus 2: NEGATIVE

## 2019-06-17 SURGERY — CLOSED REDUCTION, SHOULDER
Anesthesia: General | Site: Shoulder | Laterality: Right

## 2019-06-17 MED ORDER — OXYCODONE HCL 5 MG PO TABS: 5.0000 mg | ORAL_TABLET | Freq: Once | ORAL | Status: DC | PRN

## 2019-06-17 MED ORDER — PHENYLEPHRINE 40 MCG/ML (10ML) SYRINGE FOR IV PUSH (FOR BLOOD PRESSURE SUPPORT)
PREFILLED_SYRINGE | INTRAVENOUS | Status: AC
  Filled 2019-06-17: qty 10

## 2019-06-17 MED ORDER — ROPINIROLE HCL 1 MG PO TABS
1.0000 mg | ORAL_TABLET | Freq: Every day | ORAL | Status: DC
  Administered 2019-06-17: 1 mg via ORAL
  Filled 2019-06-17: qty 1

## 2019-06-17 MED ORDER — PROPOFOL 10 MG/ML IV BOLUS
INTRAVENOUS | Status: DC | PRN
  Administered 2019-06-17: 150 mg via INTRAVENOUS

## 2019-06-17 MED ORDER — FLUOXETINE HCL 20 MG PO CAPS
20.0000 mg | ORAL_CAPSULE | Freq: Every day | ORAL | Status: DC
  Administered 2019-06-17: 20 mg via ORAL
  Filled 2019-06-17: qty 1

## 2019-06-17 MED ORDER — PROPOFOL 10 MG/ML IV BOLUS
INTRAVENOUS | Status: AC
  Filled 2019-06-17: qty 20

## 2019-06-17 MED ORDER — HYDROMORPHONE HCL 1 MG/ML IJ SOLN
1.0000 mg | Freq: Once | INTRAMUSCULAR | Status: AC
  Administered 2019-06-17: 1 mg via INTRAVENOUS
  Filled 2019-06-17: qty 1

## 2019-06-17 MED ORDER — OXYCODONE-ACETAMINOPHEN 5-325 MG PO TABS: 1.0000 | ORAL_TABLET | ORAL | Status: DC | PRN

## 2019-06-17 MED ORDER — LIDOCAINE HCL (CARDIAC) PF 100 MG/5ML IV SOSY
PREFILLED_SYRINGE | INTRAVENOUS | Status: DC | PRN
  Administered 2019-06-17: 60 mg via INTRAVENOUS

## 2019-06-17 MED ORDER — FENTANYL CITRATE (PF) 100 MCG/2ML IJ SOLN
INTRAMUSCULAR | Status: AC
  Filled 2019-06-17: qty 2

## 2019-06-17 MED ORDER — ONDANSETRON HCL 4 MG PO TABS: 4.0000 mg | ORAL_TABLET | Freq: Three times a day (TID) | ORAL | Status: DC | PRN

## 2019-06-17 MED ORDER — FENTANYL CITRATE (PF) 100 MCG/2ML IJ SOLN: 25.0000 ug | INTRAMUSCULAR | Status: DC | PRN

## 2019-06-17 MED ORDER — BUPROPION HCL ER (XL) 150 MG PO TB24
150.0000 mg | ORAL_TABLET | Freq: Every day | ORAL | Status: DC
  Administered 2019-06-18: 150 mg via ORAL
  Filled 2019-06-17: qty 1

## 2019-06-17 MED ORDER — LABETALOL HCL 5 MG/ML IV SOLN: 5.0000 mg | INTRAVENOUS | Status: DC | PRN

## 2019-06-17 MED ORDER — LORAZEPAM 2 MG/ML IJ SOLN: 1.0000 mg | Freq: Four times a day (QID) | INTRAMUSCULAR | Status: DC | PRN

## 2019-06-17 MED ORDER — PHENYLEPHRINE 40 MCG/ML (10ML) SYRINGE FOR IV PUSH (FOR BLOOD PRESSURE SUPPORT)
PREFILLED_SYRINGE | INTRAVENOUS | Status: DC | PRN
  Administered 2019-06-17 (×2): 80 ug via INTRAVENOUS

## 2019-06-17 MED ORDER — ONDANSETRON HCL 4 MG/2ML IJ SOLN: 4.0000 mg | Freq: Four times a day (QID) | INTRAMUSCULAR | Status: DC | PRN

## 2019-06-17 MED ORDER — OXYCODONE HCL 5 MG PO TABS
5.0000 mg | ORAL_TABLET | ORAL | Status: DC | PRN
  Administered 2019-06-17: 5 mg via ORAL
  Administered 2019-06-18 (×3): 10 mg via ORAL
  Filled 2019-06-17: qty 2
  Filled 2019-06-17: qty 1
  Filled 2019-06-17 (×2): qty 2

## 2019-06-17 MED ORDER — HYDROMORPHONE HCL 1 MG/ML IJ SOLN
0.5000 mg | INTRAMUSCULAR | Status: DC | PRN
  Administered 2019-06-18: 0.5 mg via INTRAVENOUS
  Filled 2019-06-17: qty 1

## 2019-06-17 MED ORDER — BISOPROLOL FUMARATE 5 MG PO TABS
2.5000 mg | ORAL_TABLET | Freq: Every day | ORAL | Status: DC
  Administered 2019-06-17 – 2019-06-18 (×2): 2.5 mg via ORAL
  Filled 2019-06-17 (×2): qty 1

## 2019-06-17 MED ORDER — LIDOCAINE 2% (20 MG/ML) 5 ML SYRINGE
INTRAMUSCULAR | Status: AC
  Filled 2019-06-17: qty 5

## 2019-06-17 MED ORDER — HYDROMORPHONE HCL 1 MG/ML IJ SOLN: 1.0000 mg | INTRAMUSCULAR | Status: DC | PRN

## 2019-06-17 MED ORDER — ACETAMINOPHEN 500 MG PO TABS
1000.0000 mg | ORAL_TABLET | Freq: Three times a day (TID) | ORAL | Status: DC
  Administered 2019-06-17 – 2019-06-18 (×3): 1000 mg via ORAL
  Filled 2019-06-17 (×3): qty 2

## 2019-06-17 MED ORDER — APIXABAN 5 MG PO TABS: 5.0000 mg | ORAL_TABLET | Freq: Two times a day (BID) | ORAL | Status: DC

## 2019-06-17 MED ORDER — SPIRONOLACTONE 12.5 MG HALF TABLET
12.5000 mg | ORAL_TABLET | Freq: Every day | ORAL | Status: DC
  Administered 2019-06-18: 12.5 mg via ORAL
  Filled 2019-06-17 (×3): qty 1

## 2019-06-17 MED ORDER — SODIUM CHLORIDE 0.9 % IV SOLN
INTRAVENOUS | Status: DC
  Administered 2019-06-17: 13:00:00 via INTRAVENOUS

## 2019-06-17 MED ORDER — LABETALOL HCL 5 MG/ML IV SOLN
5.0000 mg | Freq: Once | INTRAVENOUS | Status: AC
  Administered 2019-06-17: 11:00:00 5 mg via INTRAVENOUS

## 2019-06-17 MED ORDER — LEVOTHYROXINE SODIUM 88 MCG PO TABS
88.0000 ug | ORAL_TABLET | Freq: Every day | ORAL | Status: DC
  Administered 2019-06-18: 88 ug via ORAL
  Filled 2019-06-17: qty 1

## 2019-06-17 MED ORDER — LACTATED RINGERS IV SOLN
INTRAVENOUS | Status: DC
  Administered 2019-06-17: 09:00:00 via INTRAVENOUS

## 2019-06-17 MED ORDER — ATORVASTATIN CALCIUM 10 MG PO TABS
10.0000 mg | ORAL_TABLET | Freq: Every day | ORAL | Status: DC
  Administered 2019-06-18: 10 mg via ORAL
  Filled 2019-06-17: qty 1

## 2019-06-17 MED ORDER — ONDANSETRON HCL 4 MG PO TABS: 4.0000 mg | ORAL_TABLET | Freq: Four times a day (QID) | ORAL | Status: DC | PRN

## 2019-06-17 MED ORDER — SUCRALFATE 1 G PO TABS
1.0000 g | ORAL_TABLET | Freq: Three times a day (TID) | ORAL | Status: DC
  Administered 2019-06-17 – 2019-06-18 (×3): 1 g via ORAL
  Filled 2019-06-17 (×3): qty 1

## 2019-06-17 MED ORDER — ONDANSETRON HCL 4 MG/2ML IJ SOLN
INTRAMUSCULAR | Status: AC
  Filled 2019-06-17: qty 2

## 2019-06-17 MED ORDER — OXYCODONE-ACETAMINOPHEN 5-325 MG PO TABS
1.0000 | ORAL_TABLET | Freq: Once | ORAL | Status: AC
  Administered 2019-06-17: 1 via ORAL
  Filled 2019-06-17: qty 1

## 2019-06-17 MED ORDER — ONDANSETRON HCL 4 MG/2ML IJ SOLN: 4.0000 mg | Freq: Once | INTRAMUSCULAR | Status: DC | PRN

## 2019-06-17 MED ORDER — POLYETHYLENE GLYCOL 3350 17 G PO PACK: 17.0000 g | PACK | Freq: Every day | ORAL | Status: DC | PRN

## 2019-06-17 MED ORDER — LABETALOL HCL 5 MG/ML IV SOLN
INTRAVENOUS | Status: AC
  Administered 2019-06-17: 5 mg via INTRAVENOUS
  Filled 2019-06-17: qty 4

## 2019-06-17 MED ORDER — FLUTICASONE PROPIONATE 50 MCG/ACT NA SUSP: 1.0000 | Freq: Every day | NASAL | Status: DC | PRN

## 2019-06-17 MED ORDER — PROPOFOL 10 MG/ML IV BOLUS
INTRAVENOUS | Status: AC | PRN
  Administered 2019-06-17: 148.9 mg via INTRAVENOUS

## 2019-06-17 MED ORDER — ZOLPIDEM TARTRATE 5 MG PO TABS: 5.0000 mg | ORAL_TABLET | Freq: Every evening | ORAL | Status: DC | PRN

## 2019-06-17 MED ORDER — LORAZEPAM 2 MG/ML IJ SOLN
1.0000 mg | Freq: Once | INTRAMUSCULAR | Status: AC
  Administered 2019-06-17: 1 mg via INTRAVENOUS
  Filled 2019-06-17: qty 1

## 2019-06-17 MED ORDER — GABAPENTIN 100 MG PO CAPS
100.0000 mg | ORAL_CAPSULE | Freq: Three times a day (TID) | ORAL | Status: DC
  Administered 2019-06-17 – 2019-06-18 (×3): 100 mg via ORAL
  Filled 2019-06-17 (×3): qty 1

## 2019-06-17 MED ORDER — DOCUSATE SODIUM 100 MG PO CAPS
100.0000 mg | ORAL_CAPSULE | Freq: Two times a day (BID) | ORAL | Status: DC
  Administered 2019-06-17 – 2019-06-18 (×2): 100 mg via ORAL
  Filled 2019-06-17 (×2): qty 1

## 2019-06-17 MED ORDER — LABETALOL HCL 5 MG/ML IV SOLN
5.0000 mg | Freq: Once | INTRAVENOUS | Status: AC
  Administered 2019-06-17: 5 mg via INTRAVENOUS

## 2019-06-17 MED ORDER — PANTOPRAZOLE SODIUM 40 MG PO TBEC
40.0000 mg | DELAYED_RELEASE_TABLET | Freq: Two times a day (BID) | ORAL | Status: DC
  Administered 2019-06-17 – 2019-06-18 (×2): 40 mg via ORAL
  Filled 2019-06-17 (×2): qty 1

## 2019-06-17 MED ORDER — OXYCODONE HCL 5 MG/5ML PO SOLN: 5.0000 mg | Freq: Once | ORAL | Status: DC | PRN

## 2019-06-17 SURGICAL SUPPLY — 6 items
GAUZE SPONGE 4X4 12PLY STRL (GAUZE/BANDAGES/DRESSINGS) ×1 IMPLANT
NDL SAFETY ECLIPSE 18X1.5 (NEEDLE) IMPLANT
NEEDLE HYPO 18GX1.5 SHARP (NEEDLE) ×4
SLING ARM FOAM STRAP SML (SOFTGOODS) ×1 IMPLANT
SYR 50ML LL SCALE MARK (SYRINGE) ×2 IMPLANT
TAPE CLOTH SURG 4X10 WHT LF (GAUZE/BANDAGES/DRESSINGS) ×1 IMPLANT

## 2019-06-17 NOTE — Progress Notes (Signed)
RT present for duration of sedation procedure.  Pt remained stable throughout procedure while on 3Lpm nasal cannula with EtCO2 monitoring on the whole time.

## 2019-06-17 NOTE — Op Note (Addendum)
NAME: [name], MARRON MEDICAL RECORD [id number] ACCOUNT [id number] DATE OF BIRTH:October 11, 1945 FACILITY: [facility] LOCATION: [facility] PHYSICIAN:Bekah Igoe Marian Sorrow, MD  OPERATIVE REPORT  DATE OF PROCEDURE:  06/17/2019  PREOPERATIVE DIAGNOSIS:  Dislocated right shoulder.  POSTOPERATIVE DIAGNOSIS:  Possible right shoulder anterior glenoid humeral dislocation versus a chronic subluxation and acute pain.  PROCEDURE:   1.  Attempted closed reduction of right shoulder. 2.  Application of right upper extremity sling.  SURGEON:  Paralee Cancel, MD  ASSISTANT:  Surgical team.  ANESTHESIA:  Monitored anesthesia care.  BLOOD LOSS:  None.  COMPLICATIONS:  None.  INDICATIONS FOR PROCEDURE:  The patient is a 73 year old female patient of mine from previous surgical intervention of her lower extremities.  She presented to the emergency room last evening after reaching behind her in a car and feeling a pop.  She was  then brought to the emergency room due to intractable pain.  Radiographs had indicated concern for an anterior shoulder dislocation.  I received a call after they had attempted to reduce her shoulder at 2 different times under significant propofol  anesthesia in the ER.  Based on this, I elected to have her held in the emergency room with the attempt to get her to the operating room this morning for closed reduction.  Risks, benefits and necessity were discussed.  Consent was obtained for attempted  pain relief.  DESCRIPTION OF PROCEDURE:  The patient was brought to the operative theater.  Once adequate anesthesia was established, a timeout was performed identifying the patient, the planned procedure and extremity.  Examination under anesthesia revealed a  fullness to the anterior aspect of the shoulder.  The mobility of her shoulder did seem a little bit limited with internal rotation initially under external rotation, abduction with traction maneuver followed by internal rotation and  abduction.   Following this maneuver, I did obtain a portable x-ray.  The shoulder still appeared to be a bit subluxed inferior.  I did reattempt the maneuver with direct pressure posteriorly and did not feel any satisfying reduction of the shoulder.  Given this, I  did attempt to aspirate for potential large hemarthrosis.  I did identify a large hemarthrosis within the shoulder joint.  However, based on the nature of blood and early clotting, I only was able to remove about 10 mL.  Based on these findings and the  radiographs obtained in the operating room, I elected to stop the procedure.  I placed her arm in an internal rotated position in a sling.  She was awoken from anesthesia and brought to the recovery room with ice on the shoulder.  I am going to have her  come to the hospital for observation.  I am going to order an MRI for evaluation of further injury to the shoulder including possible large rotator cuff tear leading to a irreducible shoulder.  Findings will be reviewed with her husband.  Plans to be  determined otherwise in terms of discharge or further treatment.  TN/NUANCE  D:06/17/2019 T:06/17/2019 JOB:[id number]

## 2019-06-17 NOTE — ED Notes (Signed)
Pt undressed and placed on monitor. Delo, MD at bedside with pt. Pt offered ice but refused. Pt has call bell within reach.
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
## 2019-06-17 NOTE — Interval H&P Note (Signed)
History and Physical Interval Note:  06/17/2019 8:34 AM  [name]  has presented today for surgery, with the diagnosis of Dislocated right shoulder.  The various methods of treatment have been discussed with the patient and family. After consideration of risks, benefits and other options for treatment, the patient has consented to  Procedure(s): CLOSED REDUCTION SHOULDER (Right) as a surgical intervention.  The patient's history has been reviewed, patient examined, no change in status, stable for surgery.  I have reviewed the patient's chart and labs.  Questions were answered to the patient's satisfaction.     Mauri Pole

## 2019-06-17 NOTE — Care Management CC44 (Signed)
Condition Code 44 Documentation Completed  Patient Details  Name: SHRADHA FERRARE MRN: [id number] Date of Birth: 1946-05-12   Condition Code 44 given:  Yes Patient signature on Condition Code 44 notice:  Yes Documentation of 2 MD's agreement:  Yes Code 44 added to claim:  Yes    Lia Hopping, LCSW 06/17/2019, 3:35 PM

## 2019-06-17 NOTE — Brief Op Note (Signed)
06/17/2019  10:25 AM  PATIENT:  [name]  73 y.o. female  PRE-OPERATIVE DIAGNOSIS:  Dislocated right shoulder  POST-OPERATIVE DIAGNOSIS:  Dislocated right shoulder  PROCEDURE:  Procedure(s): CLOSED REDUCTION SHOULDER (Right)  SURGEON:  Surgeon(s) and Role:    Paralee Cancel, MD - Primary  PHYSICIAN ASSISTANT: Team  ANESTHESIA:   MAC  EBL:  0 mL   BLOOD ADMINISTERED:none  DRAINS: none   LOCAL MEDICATIONS USED:  NONE  SPECIMEN:  No Specimen  DISPOSITION OF SPECIMEN:  N/A  COUNTS:  YES  TOURNIQUET:  * No tourniquets in log *  DICTATION: .Other Dictation: Dictation Number [id number]  PLAN OF CARE: Admit for overnight observation  PATIENT DISPOSITION:  PACU - hemodynamically stable.   Delay start of Pharmacological VTE agent (>24hrs) due to surgical blood loss or risk of bleeding: not applicable

## 2019-06-17 NOTE — Telephone Encounter (Signed)
Spoke to patient's daughter Eliquis 5 mg samples left at [employer] office front desk.Daughter stated she wanted Dr.Jordan to know mother dislocated her shoulder yesterday and had surgery last night.Advised I hope she is feeling better soon.I will make Dr.Jordan aware.

## 2019-06-17 NOTE — Anesthesia Preprocedure Evaluation (Addendum)
Anesthesia Evaluation  Patient identified by MRN, date of birth, ID band Patient awake    Reviewed: Allergy & Precautions, NPO status , Patient's Chart, lab work & pertinent test results  History of Anesthesia Complications Negative for: history of anesthetic complications  Airway Mallampati: II  TM Distance: >3 FB Neck ROM: Full    Dental  (+) Edentulous Upper, Edentulous Lower   Pulmonary COPD, former smoker,    Pulmonary exam normal        Cardiovascular hypertension, Pt. on medications Normal cardiovascular exam+ dysrhythmias Atrial Fibrillation    '20 Cath - 1. Essentially normal coronary arteries 2. Normal LV function EF 55% 3. Low filling pressures 4. Small venous wire perforation at the junction of the right cephalic vein and right subclavian vein  '20 TTE - EF 50-55%. Moderate biatrial enlargement.    Neuro/Psych  Headaches, PSYCHIATRIC DISORDERS Depression CVA, No Residual Symptoms    GI/Hepatic Neg liver ROS, hiatal hernia, GERD  Medicated and Controlled,  Endo/Other  Hypothyroidism   Renal/GU CRFRenal disease S/p right nephrectomy      Musculoskeletal  (+) Arthritis , narcotic dependent  Abdominal   Peds  Hematology negative hematology ROS (+)   Anesthesia Other Findings   Reproductive/Obstetrics                            Anesthesia Physical Anesthesia Plan  ASA: III  Anesthesia Plan: General   Post-op Pain Management:    Induction: Intravenous  PONV Risk Score and Plan: 3 and Treatment may vary due to age or medical condition and Propofol infusion  Airway Management Planned: Mask and Natural Airway  Additional Equipment: None  Intra-op Plan:   Post-operative Plan:   Informed Consent: I have reviewed the patients History and Physical, chart, labs and discussed the procedure including the risks, benefits and alternatives for the proposed anesthesia with the  patient or authorized representative who has indicated his/her understanding and acceptance.       Plan Discussed with: CRNA and Anesthesiologist  Anesthesia Plan Comments:        Anesthesia Quick Evaluation

## 2019-06-17 NOTE — Anesthesia Postprocedure Evaluation (Signed)
Anesthesia Post Note  Patient: [name]  Procedure(s) Performed: CLOSED REDUCTION SHOULDER (Right Shoulder)     Patient location during evaluation: PACU Anesthesia Type: General Level of consciousness: awake and alert Pain management: pain level controlled Vital Signs Assessment: post-procedure vital signs reviewed and stable Respiratory status: spontaneous breathing, nonlabored ventilation, respiratory function stable and patient connected to nasal cannula oxygen Cardiovascular status: blood pressure returned to baseline Postop Assessment: no apparent nausea or vomiting Anesthetic complications: no    Last Vitals:  Vitals:   06/17/19 1107 06/17/19 1108  BP:  (!) 162/101  Pulse: 90   Resp: 12   Temp:    SpO2: 96%     Last Pain:  Vitals:   06/17/19 1107  TempSrc:   PainSc: 0-No pain                 Audry Pili

## 2019-06-17 NOTE — Care Management Obs Status (Signed)
[place] NOTIFICATION   Patient Details  Name: [name] MRN: ID:[id number] Date of Birth: May 16, 1946   Medicare Observation Status Notification Given:  Yes    Lia Hopping, [place] 06/17/2019, 3:34 PM

## 2019-06-17 NOTE — ED Notes (Signed)
Lab called and stated that light green tube needs a recollect.

## 2019-06-17 NOTE — ED Notes (Signed)
Call resp . For Dr Stark Jock @[phone] 
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
## 2019-06-17 NOTE — Transfer of Care (Signed)
Immediate Anesthesia Transfer of Care Note  Patient: [name]  Procedure(s) Performed: CLOSED REDUCTION SHOULDER (Right Shoulder)  Patient Location: PACU  Anesthesia Type:General  Level of Consciousness: sedated  Airway & Oxygen Therapy: Patient Spontanous Breathing and Patient connected to face mask oxygen  Post-op Assessment: Report given to RN and Post -op Vital signs reviewed and stable  Post vital signs: Reviewed and stable  Last Vitals:  Vitals Value Taken Time  BP    Temp    Pulse    Resp 23 06/17/19 1016  SpO2    Vitals shown include unvalidated device data.  Last Pain:  Vitals:   06/17/19 0832  TempSrc: Oral  PainSc:          Complications: No apparent anesthesia complications

## 2019-06-18 ENCOUNTER — Encounter (HOSPITAL_COMMUNITY): Payer: Self-pay | Admitting: Orthopedic Surgery

## 2019-06-18 DIAGNOSIS — S43014A Anterior dislocation of right humerus, initial encounter: Secondary | ICD-10-CM | POA: Diagnosis not present

## 2019-06-18 LAB — BASIC METABOLIC PANEL
Anion gap: 9 (ref 5–15)
BUN: 19 mg/dL (ref 8–23)
CO2: 27 mmol/L (ref 22–32)
Calcium: 9 mg/dL (ref 8.9–10.3)
Chloride: 98 mmol/L (ref 98–111)
Creatinine, Ser: 1.05 mg/dL — ABNORMAL HIGH (ref 0.44–1.00)
GFR calc Af Amer: >60 mL/min (ref >60)
GFR calc non Af Amer: 52 mL/min — ABNORMAL LOW (ref >60)
Glucose, Bld: 110 mg/dL — ABNORMAL HIGH (ref 70–99)
Potassium: 3.1 mmol/L — ABNORMAL LOW (ref 3.5–5.1)
Sodium: 134 mmol/L — ABNORMAL LOW (ref 135–145)

## 2019-06-18 LAB — CBC
HCT: 39.5 % (ref 36.0–46.0)
Hemoglobin: 12.7 g/dL (ref 12.0–15.0)
MCH: 29.8 pg (ref 26.0–34.0)
MCHC: 32.2 g/dL (ref 30.0–36.0)
MCV: 92.7 fL (ref 80.0–100.0)
Platelets: 304 10*3/uL (ref 150–400)
RBC: 4.26 MIL/uL (ref 3.87–5.11)
RDW: 12.1 % (ref 11.5–15.5)
WBC: 7.9 10*3/uL (ref 4.0–10.5)
nRBC: 0 % (ref 0.0–0.2)

## 2019-06-18 MED ORDER — DOCUSATE SODIUM 100 MG PO CAPS: 100.0000 mg | ORAL_CAPSULE | Freq: Two times a day (BID) | ORAL | 0 refills | Status: DC

## 2019-06-18 MED ORDER — METHOCARBAMOL 500 MG PO TABS: 500.0000 mg | ORAL_TABLET | Freq: Four times a day (QID) | ORAL | 0 refills | Status: DC | PRN

## 2019-06-18 MED ORDER — APIXABAN 2.5 MG PO TABS
2.5000 mg | ORAL_TABLET | Freq: Two times a day (BID) | ORAL | Status: DC
  Administered 2019-06-18: 2.5 mg via ORAL
  Filled 2019-06-18: qty 1

## 2019-06-18 MED ORDER — OXYCODONE HCL 5 MG PO TABS: 5.0000 mg | ORAL_TABLET | ORAL | 0 refills | Status: DC | PRN

## 2019-06-18 MED ORDER — FERROUS SULFATE 325 (65 FE) MG PO TABS: 325.0000 mg | ORAL_TABLET | Freq: Three times a day (TID) | ORAL | 0 refills | Status: DC

## 2019-06-18 NOTE — Progress Notes (Addendum)
     Subjective: 1 Day Post-Op Procedure(s) (LRB): CLOSED REDUCTION SHOULDER (Right)   Patient reports pain as mild/moderate, controlled with medication.  No reported events throughout the night.  Patient is more comfortable with arm resting in a sling.  Dr. Alvan Dame did discuss the procedure, findings, MRI findings and expectations moving forward.  Dr. Alvan Dame discussed the patient's case with Dr. Onnie Graham who will see the patient in the office on Friday to discuss options. Patient can be discharged home today.   Objective:   VITALS:   Vitals:   06/17/19 2100 06/18/19 0507  BP:  113/73  Pulse: 72 89  Resp: 16 16  Temp:  98.3 F (36.8 C)  SpO2:  100%    Neurovascular intact Compartment soft  LABS Recent Labs    06/17/19 0847 06/18/19 0517  HGB 16.0* 12.7  HCT 47.0* 39.5  WBC  --  7.9  PLT  --  304    Recent Labs    06/17/19 0847 06/18/19 0517  NA 136 134*  K 3.4* 3.1*  BUN  --  19  CREATININE  --  1.05*  GLUCOSE  --  110*     Assessment/Plan: 1 Day Post-Op Procedure(s) (LRB): CLOSED REDUCTION SHOULDER (Right) Advance diet D/C IV fluids Discharge home today Follow up on Friday 06/20/2019 at [facility] Follow up with Dr. Onnie Graham in 2 weeks.  Contact information:  [facility] [address] [place] [name]   PAC  06/18/2019, 8:00 AM

## 2019-06-18 NOTE — Plan of Care (Signed)
Plan of care reviewed and discussed with the patient. 

## 2019-06-19 ENCOUNTER — Encounter: Payer: Medicare Other | Admitting: Internal Medicine
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
## 2019-06-20 DIAGNOSIS — M25511 Pain in right shoulder: Secondary | ICD-10-CM | POA: Diagnosis not present

## 2019-06-20 DIAGNOSIS — S43004A Unspecified dislocation of right shoulder joint, initial encounter: Secondary | ICD-10-CM | POA: Diagnosis not present

## 2019-07-14 DIAGNOSIS — M79605 Pain in left leg: Secondary | ICD-10-CM | POA: Diagnosis not present

## 2019-07-14 DIAGNOSIS — M961 Postlaminectomy syndrome, not elsewhere classified: Secondary | ICD-10-CM | POA: Diagnosis not present

## 2019-07-14 DIAGNOSIS — M25511 Pain in right shoulder: Secondary | ICD-10-CM | POA: Diagnosis not present

## 2019-07-14 DIAGNOSIS — Z79891 Long term (current) use of opiate analgesic: Secondary | ICD-10-CM | POA: Diagnosis not present

## 2019-07-14 DIAGNOSIS — Z79899 Other long term (current) drug therapy: Secondary | ICD-10-CM | POA: Diagnosis not present

## 2019-07-14 DIAGNOSIS — G894 Chronic pain syndrome: Secondary | ICD-10-CM | POA: Diagnosis not present

## 2019-07-22 ENCOUNTER — Other Ambulatory Visit: Payer: Self-pay

## 2019-07-22 ENCOUNTER — Ambulatory Visit: Payer: Medicare Other | Attending: Orthopedic Surgery | Admitting: Physical Therapy

## 2019-07-22 ENCOUNTER — Encounter: Payer: Self-pay | Admitting: Physical Therapy

## 2019-07-22 DIAGNOSIS — M25611 Stiffness of right shoulder, not elsewhere classified: Secondary | ICD-10-CM | POA: Diagnosis present

## 2019-07-22 DIAGNOSIS — M25511 Pain in right shoulder: Secondary | ICD-10-CM | POA: Insufficient documentation

## 2019-07-22 DIAGNOSIS — R293 Abnormal posture: Secondary | ICD-10-CM | POA: Insufficient documentation
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
## 2019-07-22 NOTE — Patient Instructions (Signed)
Access Code: [email or web address]  URL: [email or web address]  Date: 07/22/2019  Prepared by: Lum Babe   Exercises Seated Scapular Retraction - 10 reps - 1 sets - 3 hold - 2x daily - 7x weekly Isometric Shoulder Abduction at Wall - 10 reps - 1 sets - 3 hold - 2x daily - 7x weekly Isometric Shoulder Extension at Wall - 10 reps - 1 sets - 3 hold - 2x daily - 7x weekly Isometric Shoulder External Rotation at Wall - 10 reps - 1 sets - 3 hold - 2x daily - 7x weekly Isometric Shoulder Flexion at Wall - 10 reps - 1 sets - 3 hold - 2x daily - 7x weekly Standing Isometric Shoulder Internal Rotation at Doorway - 10 reps - 1 sets - 3 hold - 2x daily - 7x weekly Isometric Shoulder Adduction - 10 reps - 1 sets - 3 hold - 2x daily - 7x weekly

## 2019-07-25 ENCOUNTER — Encounter: Payer: Medicare Other | Admitting: Physical Therapy

## 2019-07-28 ENCOUNTER — Ambulatory Visit: Payer: Medicare Other | Admitting: Physical Therapy

## 2019-07-28 ENCOUNTER — Encounter: Payer: Self-pay | Admitting: Physical Therapy

## 2019-07-28 ENCOUNTER — Other Ambulatory Visit: Payer: Self-pay

## 2019-07-28 DIAGNOSIS — M25611 Stiffness of right shoulder, not elsewhere classified: Secondary | ICD-10-CM

## 2019-07-28 DIAGNOSIS — M25511 Pain in right shoulder: Secondary | ICD-10-CM

## 2019-07-28 DIAGNOSIS — R293 Abnormal posture: Secondary | ICD-10-CM
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
## 2019-07-31 ENCOUNTER — Encounter: Payer: Self-pay | Admitting: Physical Therapy

## 2019-07-31 ENCOUNTER — Other Ambulatory Visit: Payer: Self-pay

## 2019-07-31 ENCOUNTER — Ambulatory Visit: Payer: Medicare Other | Admitting: Physical Therapy

## 2019-07-31 DIAGNOSIS — M25511 Pain in right shoulder: Secondary | ICD-10-CM

## 2019-07-31 DIAGNOSIS — R293 Abnormal posture: Secondary | ICD-10-CM

## 2019-07-31 DIAGNOSIS — M25611 Stiffness of right shoulder, not elsewhere classified: Secondary | ICD-10-CM
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
## 2019-08-05 ENCOUNTER — Ambulatory Visit: Payer: Medicare Other | Admitting: Internal Medicine

## 2019-08-07 ENCOUNTER — Ambulatory Visit: Payer: Medicare Other | Admitting: Physical Therapy

## 2019-08-07 ENCOUNTER — Other Ambulatory Visit: Payer: Self-pay

## 2019-08-07 DIAGNOSIS — R293 Abnormal posture: Secondary | ICD-10-CM

## 2019-08-07 DIAGNOSIS — M25611 Stiffness of right shoulder, not elsewhere classified: Secondary | ICD-10-CM

## 2019-08-07 DIAGNOSIS — M25511 Pain in right shoulder: Secondary | ICD-10-CM
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
## 2019-08-12 ENCOUNTER — Ambulatory Visit: Payer: Medicare Other | Admitting: Physical Therapy

## 2019-08-19 ENCOUNTER — Other Ambulatory Visit: Payer: Self-pay

## 2019-08-19 ENCOUNTER — Ambulatory Visit: Payer: Medicare Other | Admitting: Physical Therapy

## 2019-08-19 DIAGNOSIS — R293 Abnormal posture: Secondary | ICD-10-CM

## 2019-08-19 DIAGNOSIS — M25611 Stiffness of right shoulder, not elsewhere classified: Secondary | ICD-10-CM

## 2019-08-19 DIAGNOSIS — M25511 Pain in right shoulder: Secondary | ICD-10-CM
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
## 2019-08-26 ENCOUNTER — Other Ambulatory Visit: Payer: Self-pay

## 2019-08-26 ENCOUNTER — Ambulatory Visit: Payer: Medicare Other | Attending: Orthopedic Surgery | Admitting: Physical Therapy

## 2019-08-26 DIAGNOSIS — R293 Abnormal posture: Secondary | ICD-10-CM | POA: Diagnosis not present

## 2019-08-26 DIAGNOSIS — M25611 Stiffness of right shoulder, not elsewhere classified: Secondary | ICD-10-CM | POA: Diagnosis not present

## 2019-08-26 DIAGNOSIS — M25511 Pain in right shoulder: Secondary | ICD-10-CM | POA: Insufficient documentation
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
## 2019-08-27 ENCOUNTER — Ambulatory Visit: Payer: Medicare Other | Admitting: Physician Assistant

## 2019-08-28 ENCOUNTER — Ambulatory Visit: Payer: Medicare Other | Admitting: Physical Therapy

## 2019-09-02 ENCOUNTER — Ambulatory Visit: Payer: Medicare Other | Admitting: Physical Therapy

## 2019-09-03 DIAGNOSIS — G609 Hereditary and idiopathic neuropathy, unspecified: Secondary | ICD-10-CM | POA: Diagnosis not present

## 2019-09-03 DIAGNOSIS — M5412 Radiculopathy, cervical region: Secondary | ICD-10-CM | POA: Diagnosis not present

## 2019-09-03 DIAGNOSIS — M5380 Other specified dorsopathies, site unspecified: Secondary | ICD-10-CM | POA: Diagnosis not present

## 2019-09-03 DIAGNOSIS — M542 Cervicalgia: Secondary | ICD-10-CM | POA: Diagnosis not present

## 2019-09-04 ENCOUNTER — Ambulatory Visit: Payer: Medicare Other | Admitting: Physical Therapy

## 2019-09-08 DIAGNOSIS — E7849 Other hyperlipidemia: Secondary | ICD-10-CM | POA: Diagnosis not present

## 2019-09-08 DIAGNOSIS — M961 Postlaminectomy syndrome, not elsewhere classified: Secondary | ICD-10-CM | POA: Diagnosis not present

## 2019-09-08 DIAGNOSIS — M79605 Pain in left leg: Secondary | ICD-10-CM | POA: Diagnosis not present

## 2019-09-08 DIAGNOSIS — D539 Nutritional anemia, unspecified: Secondary | ICD-10-CM | POA: Diagnosis not present

## 2019-09-08 DIAGNOSIS — G894 Chronic pain syndrome: Secondary | ICD-10-CM | POA: Diagnosis not present

## 2019-09-08 DIAGNOSIS — M545 Low back pain: Secondary | ICD-10-CM | POA: Diagnosis not present

## 2019-09-08 DIAGNOSIS — E038 Other specified hypothyroidism: Secondary | ICD-10-CM | POA: Diagnosis not present

## 2019-09-08 DIAGNOSIS — H10413 Chronic giant papillary conjunctivitis, bilateral: Secondary | ICD-10-CM | POA: Diagnosis not present

## 2019-09-17 ENCOUNTER — Ambulatory Visit: Payer: Medicare Other

## 2019-09-18 ENCOUNTER — Ambulatory Visit: Payer: Medicare Other

## 2019-09-26 ENCOUNTER — Ambulatory Visit: Payer: Medicare Other

## 2019-09-26 ENCOUNTER — Other Ambulatory Visit: Payer: Self-pay

## 2019-09-26 ENCOUNTER — Telehealth: Payer: Self-pay | Admitting: Cardiology

## 2019-09-26 MED ORDER — APIXABAN 5 MG PO TABS: 5.0000 mg | ORAL_TABLET | Freq: Two times a day (BID) | ORAL | 0 refills | Status: DC

## 2019-09-26 NOTE — Telephone Encounter (Signed)
New Message     Patient calling the office for samples of medication:   1.  What medication and dosage are you requesting samples for? Eliquis 5 mg   2.  Are you currently out of this medication? Pt is out and is wanting [name] to call her about samples

## 2019-09-26 NOTE — Telephone Encounter (Signed)
Called patient, advised we did not have samples but I could try to send in a week supply for her to her local pharmacy to see if we get anymore.  Will make Malachy Mood aware as she gets samples often- unsure about patient assistance.

## 2019-09-30 ENCOUNTER — Other Ambulatory Visit: Payer: Self-pay

## 2019-09-30 ENCOUNTER — Telehealth: Payer: Self-pay

## 2019-09-30 ENCOUNTER — Ambulatory Visit (INDEPENDENT_AMBULATORY_CARE_PROVIDER_SITE_OTHER): Payer: Medicare Other | Admitting: Internal Medicine

## 2019-09-30 ENCOUNTER — Encounter: Payer: Self-pay | Admitting: Internal Medicine

## 2019-09-30 VITALS — BP 144/66 | HR 70 | Temp 98.3°F | Ht 66.0 in | Wt 118.0 lb

## 2019-09-30 DIAGNOSIS — R110 Nausea: Secondary | ICD-10-CM

## 2019-09-30 DIAGNOSIS — Z7901 Long term (current) use of anticoagulants: Secondary | ICD-10-CM | POA: Diagnosis not present

## 2019-09-30 DIAGNOSIS — K2990 Gastroduodenitis, unspecified, without bleeding: Secondary | ICD-10-CM | POA: Diagnosis not present

## 2019-09-30 DIAGNOSIS — Z1211 Encounter for screening for malignant neoplasm of colon: Secondary | ICD-10-CM | POA: Diagnosis not present

## 2019-09-30 DIAGNOSIS — Z01818 Encounter for other preprocedural examination: Secondary | ICD-10-CM

## 2019-09-30 DIAGNOSIS — K5909 Other constipation: Secondary | ICD-10-CM | POA: Diagnosis not present

## 2019-09-30 MED ORDER — ONDANSETRON 4 MG PO TBDP: 4.0000 mg | ORAL_TABLET | Freq: Three times a day (TID) | ORAL | 1 refills | Status: DC | PRN

## 2019-09-30 NOTE — Telephone Encounter (Signed)
   Primary Cardiologist: Peter Martinique, MD  Chart reviewed as part of pre-operative protocol coverage. Given past medical history and time since last visit, based on ACC/AHA guidelines, [name] would be at acceptable risk for the planned procedure without further cardiovascular testing.   She is also going to see Dr. Martinique in the office in 5 days for evaluation. He will also send you a note.  Pharmacy has determined and recommended that she hold the Eliquis for 24 hours prior to the EDG: .   Per Pharmacy: 09/30/2019 Patient with diagnosis of afib on Elqiuis for anticoagulation.  Procedure: endosocpy/colonoscopy  Date of procedure: 10/22/19  CHADS2-VASc score of 5 (HTN, AGE, stroke/tia x 2, female)  CrCl 39 ml/min  Due to patient history of CVA, would recommend patient hold Eliquis for 1 day prior to procedure.   I will route this recommendation to the requesting party via Epic fax function and remove from pre-op pool.  Please call with questions.  Phill Myron. [name] DNP, ANP, AACC  09/30/2019, 1:48 PM

## 2019-09-30 NOTE — Patient Instructions (Addendum)
If you are age 75 or older, your body mass index should be between 23-30. Your Body mass index is 19.05 kg/m. If this is out of the aforementioned range listed, please consider follow up with your Primary Care Provider.  If you are age 65 or younger, your body mass index should be between 19-25. Your Body mass index is 19.05 kg/m. If this is out of the aformentioned range listed, please consider follow up with your Primary Care Provider.   You have been scheduled for a colonoscopy. Please follow written instructions given to you at your visit today.  Please pick up your prep supplies at the pharmacy within the next 1-3 days. If you use inhalers (even only as needed), please bring them with you on the day of your procedure. Your physician has requested that you go to www.startemmi.com and enter the access code given to you at your visit today. This web site gives a general overview about your procedure. However, you should still follow specific instructions given to you by our office regarding your preparation for the procedure.  Due to recent changes in healthcare laws, you may see the results of your imaging and laboratory studies on MyChart before your provider has had a chance to review them.  We understand that in some cases there may be results that are confusing or concerning to you. Not all laboratory results come back in the same time frame and the provider may be waiting for multiple results in order to interpret others.  Please give [place] 48 hours in order for your provider to thoroughly review all the results before contacting the office for clarification of your results.  ----------------------------------------------------------------------------------------------------------------  Due to recent COVID-19 restrictions implemented by [employer] and state authorities and in an effort to keep both patients and staff as safe as possible, [facility] requires COVID-19  testing prior to any scheduled endoscopic procedure. The testing center is located at [address] [place], [place] [address] in the [facility] [employer]  suite.  Your appointment has been scheduled for 10/20/19 at 8:50 am.   Please bring your insurance cards to this appointment. You will require your COVID screen 2 business days prior to your endoscopic procedure.  You are not required to quarantine after your screening.  You will only receive a phone call with the results if it is POSITIVE.  If you do not receive a call the day before your procedure you should begin your prep, if ordered, and you should report to the endo center for your procedure at your designated appointment arrival time ( one hour prior to the procedure time). There is no cost to you for the screening on the day of the swab.  [facility] will file with your insurance company for the testing.    You may receive an automated phone call prior to your procedure or have a message in your MyChart that you have an appointment for a BP/15 at the [facility], please disregard this message.  Your testing will be at the [place] [place] location.   If you are leaving [facility] travel [place] on [address], turn left onto [facility], turn night onto [address] at the 1st stop light turn right, pass the [employer] on your right and proceed to [place] (white building).  _____________________________________________________________________  Dennis Bast will be contacted by our office prior to your procedure for  directions on holding your Eliquis.  If you do not hear from our office 1 week prior to your scheduled procedure, please call [phone] to discuss.  _______________________________________________________________________  We have sent the following medications to your pharmacy for you to pick up at  your convenience: Zofran 4 mg every 8 hours as needed  Take 1/2 capful of miralax daily  Please continue your Carafate and Levsin as needed  Continue pantoprazole daily

## 2019-09-30 NOTE — Telephone Encounter (Signed)
Patient with diagnosis of afib on Elqiuis for anticoagulation.    Procedure:   endosocpy/colonoscopy Date of procedure: 10/22/19  CHADS2-VASc score of  5 (HTN, AGE, stroke/tia x 2, female)  CrCl 39 ml/min  Due to patient history of CVA, would recommend patient hold Eliquis for 1 day prior to procedure.

## 2019-09-30 NOTE — Telephone Encounter (Signed)
Request for surgical clearance:     Endoscopy Procedure  What type of surgery is being performed?     endosocpy/colonoscopy  When is this surgery scheduled?     10/22/19  What type of clearance is required ?   Pharmacy  Are there any medications that need to be held prior to surgery and how long? Eliquis, 2 days  Practice name and name of physician performing surgery?      [facility]  What is your office phone and fax number?      Phone- [phone]  [phone]  Anesthesia type (None, local, MAC, general) ?       MAC

## 2019-09-30 NOTE — Telephone Encounter (Signed)
Will route to pharmacy for recommendations.

## 2019-09-30 NOTE — Progress Notes (Signed)
Subjective:    Patient ID: [name], female    DOB: Nov 24, 1945, 74 y.o.   MRN: [id number]  HPI [name] is a 74 year old female with a GI history notable for history of gastric adenoma, history of peptic ulcer disease, prior cholecystectomy in November 2017, pancreas divisum, history of Candida esophagitis, chronic constipation who is seen for follow-up.  She is here today with her husband.  She also has medical history significant for atrial fibrillation on Eliquis, COPD, chronic kidney disease, severe osteoarthritis and chronic pain syndrome on narcotics, hypertension, hypothyroidism and history of ocular stroke.  She was last seen in the office in September 2020 by Nicoletta Ba, PA-C.  At that appointment they discussed constipation as well as the gastroduodenitis with small ulcer seen during hospitalization in June 2020.  We also arranged for a screening colonoscopy but this was never performed.  Today [name] reports that she is having back-and-forth bowel pattern though it sounds as predominant constipation.  She will have a bowel movement every 3 to 4 days.  She reports that she will sit on the toilet without straining for a prolonged period of time waiting for stools to start.  They often start with formed rarely hard but then can be loose at the end of a bowel movement.  At times she feels incomplete evacuation.  No rectal bleeding or melena.  She still having waves of nausea which can begin suddenly or at other times, and somewhat slowly.  Can wake her up at night and can wake up with nausea.  Not always related to eating.  Much less vomiting than she has done in the past maybe 3 or 4 episodes of vomiting in the last 7 to 8 months.  She is using Zofran which seems to help.  She also uses Levsin for abdominal discomfort which helps.  She is not using MiraLAX recently.  She is remaining on pantoprazole which she takes 40 mg twice daily.  Her appetite she feels has been fair for her  and better than in the summer 2020.  She does not eat a large meal but she states she is able to eat a full meal.  She denies dysphagia.  She does have a fullness at times in her left upper quadrant which she thinks gets better if she has a full bowel movement.  Review of Systems As per HPI, otherwise negative  Current Medications, Allergies, Past Medical History, Past Surgical History, Family History and Social History were reviewed in [employer] record.     Objective:   Physical Exam BP (!) 144/66   Pulse 70   Temp 98.3 F (36.8 C)   Ht 5\' 6"  (1.676 m)   Wt 118 lb (53.5 kg)   BMI 19.05 kg/m  Gen: awake, alert, NAD HEENT: anicteric CV: RRR Pulm: CTA b/l Abd: soft, NT/ND, +BS throughout Ext: no c/c/e Neuro: nonfocal  CBC    Component Value Date/Time   WBC 7.9 06/18/2019 0517   RBC 4.26 06/18/2019 0517   HGB 12.7 06/18/2019 0517   HGB 9.6 (L) 07/17/2017 1506   HCT 39.5 06/18/2019 0517   HCT 29.9 (L) 07/17/2017 1506   PLT 304 06/18/2019 0517   PLT 414 (H) 07/17/2017 1506   MCV 92.7 06/18/2019 0517   MCV 94 07/17/2017 1506   MCH 29.8 06/18/2019 0517   MCHC 32.2 06/18/2019 0517   RDW 12.1 06/18/2019 0517   RDW 14.5 07/17/2017 1506   LYMPHSABS 2.4 02/14/2019  0515   MONOABS 0.6 02/14/2019 0515   EOSABS 0.1 02/14/2019 0515   BASOSABS 0.0 02/14/2019 0515   CMP     Component Value Date/Time   NA 134 (L) 06/18/2019 0517   NA 139 10/11/2017 1547   K 3.1 (L) 06/18/2019 0517   CL 98 06/18/2019 0517   CO2 27 06/18/2019 0517   GLUCOSE 110 (H) 06/18/2019 0517   BUN 19 06/18/2019 0517   BUN 20 10/11/2017 1547   CREATININE 1.05 (H) 06/18/2019 0517   CALCIUM 9.0 06/18/2019 0517   PROT 5.3 (L) 02/14/2019 0515   ALBUMIN 3.3 (L) 02/14/2019 0515   AST 13 (L) 02/14/2019 0515   ALT 26 02/14/2019 0515   ALKPHOS 47 02/14/2019 0515   BILITOT 1.2 02/14/2019 0515   GFRNONAA 52 (L) 06/18/2019 0517   GFRAA >60 06/18/2019 0517    ADDENDUM: There is mild  diffuse wall thickening of the esophagus which can be seen in patients with esophagitis.     Electronically Signed   By: Constance Holster M.D.   On: 02/11/2019 21:22    Addended by Amado Coe, MD on 02/11/2019  9:24 PM  Study Result  CLINICAL DATA:  Abdominal pain   EXAM: CT ABDOMEN AND PELVIS WITH CONTRAST   TECHNIQUE: Multidetector CT imaging of the abdomen and pelvis was performed using the standard protocol following bolus administration of intravenous contrast.   CONTRAST:  123mL OMNIPAQUE IOHEXOL 300 MG/ML  SOLN   COMPARISON:  CT dated May 02, 2011.   FINDINGS: Lower chest: There is a small right-sided pleural effusion with adjacent compressive atelectasis. The heart size is mild-to-moderately enlarged.   Hepatobiliary: There are innumerable small cystic structures throughout the liver that are too small to characterize but are statistically most likely to represent benign cysts. The patient is status post prior cholecystectomy. There is mild intrahepatic and extrahepatic biliary ductal dilatation. Again identified is a large hypoattenuating partially calcified mass in the left hepatic lobe. 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   Pancreas: The pancreatic duct is dilated. There is no discrete pancreatic mass.   Spleen: Normal in size without focal abnormality.   Adrenals/Urinary Tract: The right kidney is absent. The right adrenal gland is not well visualized. The left adrenal gland is unremarkable. The left kidney is unremarkable. There is a moderate amount of gas in the urinary bladder.   Stomach/Bowel: There is oral contrast the level of the rectum. There is mild diffuse wall thickening of the esophagus. The stomach is grossly unremarkable. There is no evidence of a small-bowel obstruction. No CT evidence of colitis.   Vascular/Lymphatic: Diffuse atherosclerotic changes are noted of the abdominal aorta. 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 This is stable from prior CT in 2012.   Reproductive: Status post hysterectomy. No adnexal masses.   Other: No abdominal wall hernia or abnormality. No abdominopelvic ascites.   Musculoskeletal: No fracture is seen.   IMPRESSION: 1. No acute intra-abdominal abnormality detected. 2. Small partially visualized right-sided pleural effusion. 3. Status post cholecystectomy. There is intrahepatic and extrahepatic biliary ductal dilatation. There is pancreatic ductal dilatation. Correlation with laboratory studies is recommended. If there is clinical concern for an obstructing process, follow-up with outpatient MRCP or ERCP is recommended. 4. 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 This is essentially stable since 2012.   Aortic Atherosclerosis (ICD10-I70.0).   Electronically Signed: By: Constance Holster M.D. On: 02/11/2019 21:09  Assessment & Plan:  74 year old female with a GI history notable for history of gastric adenoma, history of peptic ulcer disease, prior cholecystectomy in November 2017, pancreas divisum, history of Candida esophagitis, chronic constipation who is seen for follow-up.  She is here today with her husband.  1.  Chronic and intermittent nausea with rare vomiting/history of gastroduodenitis and ulcer disease/history of gastric adenoma--her vomiting is improved though she still has intermittent issues with nausea.  It sounds like appetite overall is better than it was and she denies early satiety.  She has been on twice daily PPI and it seems benefiting from Carafate which she uses as needed.  We will repeat upper endoscopy at the same time as her screening colonoscopy, see below.  We will document improvement in her gastroduodenitis and ulcer disease.  We discussed the risk, benefits and alternatives and she is agreeable and wishes to proceed --Continue pantoprazole 40 mg twice daily AC for  now --Okay to use Carafate as prescribed on an as-needed basis --Zofran 4 mg every 8 hours as needed nausea, will change to ODT  2.  Chronic constipation --this is likely exacerbated by chronic narcotic use.  She also very likely has overflow looser stools at times.  We discussed this today.  I would like to have her achieve more regular bowel movements which I think will help with abdominal symptoms but also prevent overflow diarrhea.  I am to have her start half dose MiraLAX daily --MiraLAX 8 g daily --Can use Levsin 0.125 mg 1 to 2 tablets every 6 hours as needed  3.  Screening colonoscopy --last colonoscopy was greater than 10 years ago.  She is interested in screening.  We discussed the risk, benefits and alternatives and she is agreeable and wishes to proceed.  We will plan to use metoclopramide before each half of the bowel preparation to try to prevent nausea and vomiting in help her achieve a good prep.  Will hold Eliquis 2 days prior to endoscopic procedures - will instruct when and how to resume after procedure. Benefits and risks of procedure explained including risks of bleeding, perforation, infection, missed lesions, reactions to medications and possible need for hospitalization and surgery for complications. Additional rare but real risk of stroke or other vascular clotting events off Eliquis also explained and need to seek urgent help if any signs of these problems occur. Will communicate by phone or EMR with patient's  prescribing provider to confirm that holding Eliquis is reasonable in this case.   30 minutes total spent today including patient facing time, coordination of care, reviewing medical history/procedures/pertinent radiology studies, and documentation of the encounter.

## 2019-10-03 ENCOUNTER — Telehealth: Payer: Self-pay

## 2019-10-03 NOTE — Telephone Encounter (Signed)
Called patient to let her know we had to move her EGD/Colon back by 30 mins. Instead of starting her prep at 5:30am she will start it at 6:00am. And arrive at 10:00am. This was done to make a 1 hour slot before hers  for an IDDM patient having an EGD/Colon

## 2019-10-06 DIAGNOSIS — M79605 Pain in left leg: Secondary | ICD-10-CM | POA: Diagnosis not present

## 2019-10-06 DIAGNOSIS — R208 Other disturbances of skin sensation: Secondary | ICD-10-CM | POA: Diagnosis not present

## 2019-10-06 DIAGNOSIS — G894 Chronic pain syndrome: Secondary | ICD-10-CM | POA: Diagnosis not present

## 2019-10-06 DIAGNOSIS — M961 Postlaminectomy syndrome, not elsewhere classified: Secondary | ICD-10-CM | POA: Diagnosis not present

## 2019-10-07 ENCOUNTER — Telehealth: Payer: Self-pay | Admitting: Cardiology

## 2019-10-07 MED ORDER — APIXABAN 5 MG PO TABS: 5.0000 mg | ORAL_TABLET | Freq: Two times a day (BID) | ORAL | 0 refills | Status: DC

## 2019-10-07 NOTE — Telephone Encounter (Signed)
Patient calling the office for samples of medication:   1.  What medication and dosage are you requesting samples for? apixaban (ELIQUIS) 5 MG TABS tablet  2.  Are you currently out of this medication? yes   Patient states her prescription that was sent in for a week was over a hundred dollars. She would like a call back from [place] and says if she does not pick up to call her husbands number [phone].

## 2019-10-07 NOTE — Telephone Encounter (Signed)
I have spoken to patient to advise that cardiology prefers she only hold Eliquis for 24 hours prior to upcoming procedure. Dr Hilarie Fredrickson indicates he would like her to hold Eliquis for the full 24 hours allowed by cardiology. Patient verbalizes full understanding of this.

## 2019-10-07 NOTE — Telephone Encounter (Signed)
Yes needs full 24 hour hold

## 2019-10-07 NOTE — Telephone Encounter (Signed)
Patient called to request Eliquis samples. Patient given 14 days of Eliquis 5mg  samples.

## 2019-10-07 NOTE — Telephone Encounter (Signed)
Dr Hilarie Fredrickson, please see notes below... cardiology has only given 1 day clearance for eliquis hold. Is this sufficient for you?

## 2019-10-08 ENCOUNTER — Ambulatory Visit: Payer: Medicare Other

## 2019-10-09 ENCOUNTER — Ambulatory Visit: Payer: Medicare Other
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
## 2019-10-14 ENCOUNTER — Encounter: Payer: Self-pay | Admitting: Cardiology

## 2019-10-14 ENCOUNTER — Ambulatory Visit (INDEPENDENT_AMBULATORY_CARE_PROVIDER_SITE_OTHER): Payer: Medicare Other | Admitting: Cardiology

## 2019-10-14 ENCOUNTER — Other Ambulatory Visit: Payer: Self-pay

## 2019-10-14 VITALS — BP 135/91 | HR 100 | Temp 97.9°F | Ht 66.0 in | Wt 119.0 lb

## 2019-10-14 DIAGNOSIS — I482 Chronic atrial fibrillation, unspecified: Secondary | ICD-10-CM

## 2019-10-14 DIAGNOSIS — I5181 Takotsubo syndrome: Secondary | ICD-10-CM

## 2019-10-14 DIAGNOSIS — I10 Essential (primary) hypertension: Secondary | ICD-10-CM

## 2019-10-14 DIAGNOSIS — M25511 Pain in right shoulder: Secondary | ICD-10-CM | POA: Diagnosis not present

## 2019-10-14 DIAGNOSIS — R600 Localized edema: Secondary | ICD-10-CM

## 2019-10-14 DIAGNOSIS — S43031A Inferior subluxation of right humerus, initial encounter: Secondary | ICD-10-CM | POA: Diagnosis not present

## 2019-10-14 MED ORDER — SPIRONOLACTONE 25 MG PO TABS: 25.0000 mg | ORAL_TABLET | Freq: Every day | ORAL | 3 refills | Status: DC

## 2019-10-14 NOTE — Patient Instructions (Signed)
Increase aldactone to 25 mg daily

## 2019-10-15 DIAGNOSIS — M25811 Other specified joint disorders, right shoulder: Secondary | ICD-10-CM | POA: Diagnosis not present

## 2019-10-15 DIAGNOSIS — S43001D Unspecified subluxation of right shoulder joint, subsequent encounter: Secondary | ICD-10-CM | POA: Diagnosis not present

## 2019-10-15 DIAGNOSIS — M25511 Pain in right shoulder: Secondary | ICD-10-CM | POA: Diagnosis not present

## 2019-10-20 ENCOUNTER — Other Ambulatory Visit: Payer: Self-pay | Admitting: Internal Medicine

## 2019-10-20 ENCOUNTER — Ambulatory Visit (INDEPENDENT_AMBULATORY_CARE_PROVIDER_SITE_OTHER): Payer: Medicare Other

## 2019-10-20 DIAGNOSIS — Z1159 Encounter for screening for other viral diseases: Secondary | ICD-10-CM

## 2019-10-20 LAB — SARS CORONAVIRUS 2 (TAT 6-24 HRS): SARS Coronavirus 2: NEGATIVE

## 2019-10-22 ENCOUNTER — Other Ambulatory Visit: Payer: Self-pay

## 2019-10-22 ENCOUNTER — Encounter: Payer: Self-pay | Admitting: Internal Medicine

## 2019-10-22 ENCOUNTER — Encounter: Payer: Medicare Other | Admitting: Internal Medicine

## 2019-10-22 ENCOUNTER — Ambulatory Visit (AMBULATORY_SURGERY_CENTER): Payer: Medicare Other | Admitting: Internal Medicine

## 2019-10-22 VITALS — BP 157/94 | HR 96 | Temp 96.9°F | Resp 16 | Ht 66.0 in | Wt 118.0 lb

## 2019-10-22 DIAGNOSIS — K219 Gastro-esophageal reflux disease without esophagitis: Secondary | ICD-10-CM | POA: Diagnosis not present

## 2019-10-22 DIAGNOSIS — J449 Chronic obstructive pulmonary disease, unspecified: Secondary | ICD-10-CM | POA: Diagnosis not present

## 2019-10-22 DIAGNOSIS — Z8719 Personal history of other diseases of the digestive system: Secondary | ICD-10-CM

## 2019-10-22 DIAGNOSIS — K222 Esophageal obstruction: Secondary | ICD-10-CM | POA: Diagnosis not present

## 2019-10-22 DIAGNOSIS — R110 Nausea: Secondary | ICD-10-CM

## 2019-10-22 DIAGNOSIS — K2950 Unspecified chronic gastritis without bleeding: Secondary | ICD-10-CM

## 2019-10-22 DIAGNOSIS — K449 Diaphragmatic hernia without obstruction or gangrene: Secondary | ICD-10-CM | POA: Diagnosis not present

## 2019-10-22 DIAGNOSIS — Z1211 Encounter for screening for malignant neoplasm of colon: Secondary | ICD-10-CM

## 2019-10-22 DIAGNOSIS — K5909 Other constipation: Secondary | ICD-10-CM

## 2019-10-22 DIAGNOSIS — I4891 Unspecified atrial fibrillation: Secondary | ICD-10-CM | POA: Diagnosis not present

## 2019-10-22 DIAGNOSIS — Z8711 Personal history of peptic ulcer disease: Secondary | ICD-10-CM

## 2019-10-22 MED ORDER — SODIUM CHLORIDE 0.9 % IV SOLN: 500.0000 mL | Freq: Once | INTRAVENOUS | Status: DC

## 2019-10-22 NOTE — Progress Notes (Signed)
Called to room to assist during endoscopic procedure.  Patient ID and intended procedure confirmed with present staff. Received instructions for my participation in the procedure from the performing physician.  

## 2019-10-22 NOTE — Progress Notes (Signed)
Temp check by:LC Vital check by:CW  The medical and surgical history was reviewed and verified with the patient.

## 2019-10-22 NOTE — Progress Notes (Signed)
Report to PACU, RN, vss, BBS= Clear.  

## 2019-10-22 NOTE — Op Note (Signed)
[name] Patient Name: [name] Procedure Date: 10/22/2019 11:24 AM MRN: [id number] Endoscopist: Jerene Bears , MD Age: 74 Referring MD:  Date of Birth: Jul 16, 1946 Gender: Female Account #: [id number] Procedure:                Upper GI endoscopy Indications:              Nausea with vomiting, history of peptic ulcer                            disease and history of gastric adenoma Medicines:                Monitored Anesthesia Care Procedure:                Pre-Anesthesia Assessment:                           - Prior to the procedure, a History and Physical                            was performed, and patient medications and                            allergies were reviewed. The patient's tolerance of                            previous anesthesia was also reviewed. The risks                            and benefits of the procedure and the sedation                            options and risks were discussed with the patient.                            All questions were answered, and informed consent                            was obtained. Prior Anticoagulants: The patient has                            taken Eliquis (apixaban), last dose was 2 days                            prior to procedure. ASA Grade Assessment: III - A                            patient with severe systemic disease. After                            reviewing the risks and benefits, the patient was                            deemed in satisfactory condition to undergo the  procedure.                           After obtaining informed consent, the endoscope was                            passed under direct vision. Throughout the                            procedure, the patient's blood pressure, pulse, and                            oxygen saturations were monitored continuously. The                            Endoscope was introduced through the mouth, and                    advanced to the second part of duodenum. The upper                            GI endoscopy was accomplished without difficulty.                            The patient tolerated the procedure well. Scope In: Scope Out: Findings:                 The middle third of the esophagus and lower third                            of the esophagus were moderately tortuous.                           A 1-2 cm hiatal hernia was present with a partial                            Schatzki's ring at the GE junction. The ring is                            nonobstructing.                           Diffuse mild inflammation characterized by erythema                            and atrophic appearing mucosa was found in the                            gastric body and in the gastric antrum. Biopsies                            were taken with a cold forceps for histology and                            Helicobacter pylori testing. No evidence for polyps  or ulcerations.                           The examined duodenum was normal. Complications:            No immediate complications. Estimated Blood Loss:     Estimated blood loss was minimal. 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                           - Small hiatal hernia with partial Schatzki's ring.                           - Gastritis, likely chronic. Biopsied to exclude H.                            pylori. No ulcers or polyps found in the stomach.                           - Normal examined duodenum. Recommendation:           - Patient has a contact number available for                            emergencies. The signs and symptoms of potential                            delayed complications were discussed with the                            patient. Return to normal activities tomorrow.                             Written discharge instructions were provided to the                            patient.                           - Resume previous diet.                           - Continue present medications.                           - Await pathology results.                           - See the other procedure note for documentation of                            additional recommendations. Jerene Bears, MD 10/22/2019 11:56:22 AM This report has been signed electronically.

## 2019-10-22 NOTE — Op Note (Signed)
[place] Patient Name: [name] Procedure Date: 10/22/2019 11:23 AM MRN: [id number] Endoscopist: Jerene Bears , MD Age: 74 Referring MD:  Date of Birth: August 03, 1946 Gender: Female Account #: [id number] Procedure:                Colonoscopy Indications:              Screening for colorectal malignant neoplasm, Last                            colonoscopy greater than 10 years ago Medicines:                Monitored Anesthesia Care Procedure:                Pre-Anesthesia Assessment:                           - Prior to the procedure, a History and Physical                            was performed, and patient medications and                            allergies were reviewed. The patient's tolerance of                            previous anesthesia was also reviewed. The risks                            and benefits of the procedure and the sedation                            options and risks were discussed with the patient.                            All questions were answered, and informed consent                            was obtained. Prior Anticoagulants: The patient has                            taken Eliquis (apixaban), last dose was 2 days                            prior to procedure. ASA Grade Assessment: III - A                            patient with severe systemic disease. After                            reviewing the risks and benefits, the patient was                            deemed in satisfactory condition to undergo the  procedure.                           After obtaining informed consent, the colonoscope                            was passed under direct vision. Throughout the                            procedure, the patient's blood pressure, pulse, and                            oxygen saturations were monitored continuously. The                            Colonoscope was introduced through the anus and                  advanced to the cecum, identified by appendiceal                            orifice and ileocecal valve. The colonoscopy was                            performed without difficulty. The patient tolerated                            the procedure well. The quality of the bowel                            preparation was good. The ileocecal valve,                            appendiceal orifice, and rectum were photographed. Scope In: 11:37:42 AM Scope Out: 11:49:57 AM Scope Withdrawal Time: 0 hours 7 minutes 30 seconds  Total Procedure Duration: 0 hours 12 minutes 15 seconds  Findings:                 The digital rectal exam was normal.                           The entire examined colon appeared normal on direct                            and retroflexion views. Complications:            No immediate complications. Estimated Blood Loss:     Estimated blood loss: none. Impression:               - The entire examined colon is normal on direct and                            retroflexion views.                           - No specimens collected. Recommendation:           - Patient has a  contact number available for                            emergencies. The signs and symptoms of potential                            delayed complications were discussed with the                            patient. Return to normal activities tomorrow.                            Written discharge instructions were provided to the                            patient.                           - Resume previous diet.                           - Continue present medications.                           - Resume Eliquis (apixaban) at prior dose today.                            Refer to managing physician for further adjustment                            of therapy.                           - No repeat colonoscopy due to age (at next                            screening interval) and the absence of  colonic                            polyps today. Jerene Bears, MD 10/22/2019 11:58:55 AM This report has been signed electronically.

## 2019-10-22 NOTE — Patient Instructions (Signed)
Please read handouts provided. Continue present medications. Await pathology results. Resume Eliquis ( apixaban ) at prior dose today.      YOU HAD AN ENDOSCOPIC PROCEDURE TODAY AT [facility]:   Refer to the procedure report that was given to you for any specific questions about what was found during the examination.  If the procedure report does not answer your questions, please call your gastroenterologist to clarify.  If you requested that your care partner not be given the details of your procedure findings, then the procedure report has been included in a sealed envelope for you to review at your convenience later.  YOU SHOULD EXPECT: Some feelings of bloating in the abdomen. Passage of more gas than usual.  Walking can help get rid of the air that was put into your GI tract during the procedure and reduce the bloating. If you had a lower endoscopy (such as a colonoscopy or flexible sigmoidoscopy) you may notice spotting of blood in your stool or on the toilet paper. If you underwent a bowel prep for your procedure, you may not have a normal bowel movement for a few days.  Please Note:  You might notice some irritation and congestion in your nose or some drainage.  This is from the oxygen used during your procedure.  There is no need for concern and it should clear up in a day or so.  SYMPTOMS TO REPORT IMMEDIATELY:   Following lower endoscopy (colonoscopy or flexible sigmoidoscopy):  Excessive amounts of blood in the stool  Significant tenderness or worsening of abdominal pains  Swelling of the abdomen that is new, acute  Fever of 100F or higher   Following upper endoscopy (EGD)  Vomiting of blood or coffee ground material  New chest pain or pain under the shoulder blades  Painful or persistently difficult swallowing  New shortness of breath  Fever of 100F or higher  Black, tarry-looking stools  For urgent or emergent issues, a gastroenterologist can be  reached at any hour by calling [phone]. Do not use MyChart messaging for urgent concerns.    DIET:  We do recommend a small meal at first, but then you may proceed to your regular diet.  Drink plenty of fluids but you should avoid alcoholic beverages for 24 hours.  ACTIVITY:  You should plan to take it easy for the rest of today and you should NOT DRIVE or use heavy machinery until tomorrow (because of the sedation medicines used during the test).    FOLLOW UP: Our staff will call the number listed on your records 48-72 hours following your procedure to check on you and address any questions or concerns that you may have regarding the information given to you following your procedure. If we do not reach you, we will leave a message.  We will attempt to reach you two times.  During this call, we will ask if you have developed any symptoms of COVID 19. If you develop any symptoms (ie: fever, flu-like symptoms, shortness of breath, cough etc.) before then, please call [phone].  If you test positive for Covid 19 in the 2 weeks post procedure, please call and report this information to [place].    If any biopsies were taken you will be contacted by phone or by letter within the next 1-3 weeks.  Please call us at [phone] if you have not heard about the biopsies in 3 weeks.    SIGNATURES/CONFIDENTIALITY: You and/or your care partner have  signed paperwork which will be entered into your electronic medical record.  These signatures attest to the fact that that the information above on your After Visit Summary has been reviewed and is understood.  Full responsibility of the confidentiality of this discharge information lies with you and/or your care-partner.

## 2019-10-24 ENCOUNTER — Telehealth: Payer: Self-pay

## 2019-10-24 NOTE — Telephone Encounter (Signed)
  Follow up Call-  Call back number 10/22/2019  Post procedure Call Back phone  # [phone]  Permission to leave phone message Yes  Some recent data might be hidden     Patient questions:  Do you have a fever, pain , or abdominal swelling? No. Pain Score  0 *  Have you tolerated food without any problems? Yes.    Have you been able to return to your normal activities? Yes.    Do you have any questions about your discharge instructions: Diet   No. Medications  No. Follow up visit  No.  Do you have questions or concerns about your Care? No.  Actions: * If pain score is 4 or above: No action needed, pain <4.    1. Have you developed a fever since your procedure? No  2.   Have you had an respiratory symptoms (SOB or cough) since your procedure? No  3.   Have you tested positive for COVID 19 since your procedure No  4.   Have you had any family members/close contacts diagnosed with the COVID 19 since your procedure?  No   If yes to any of these questions please route to Joylene John, RN and Alphonsa Gin, RN.

## 2019-10-27 ENCOUNTER — Encounter: Payer: Self-pay | Admitting: Internal Medicine

## 2019-10-31 DIAGNOSIS — S43004A Unspecified dislocation of right shoulder joint, initial encounter: Secondary | ICD-10-CM | POA: Diagnosis not present

## 2019-11-03 DIAGNOSIS — M25511 Pain in right shoulder: Secondary | ICD-10-CM | POA: Diagnosis not present

## 2019-11-03 DIAGNOSIS — M19111 Post-traumatic osteoarthritis, right shoulder: Secondary | ICD-10-CM | POA: Diagnosis not present

## 2019-11-04 DIAGNOSIS — Z79891 Long term (current) use of opiate analgesic: Secondary | ICD-10-CM | POA: Diagnosis not present

## 2019-11-04 DIAGNOSIS — M25519 Pain in unspecified shoulder: Secondary | ICD-10-CM | POA: Diagnosis not present

## 2019-11-04 DIAGNOSIS — M25569 Pain in unspecified knee: Secondary | ICD-10-CM | POA: Diagnosis not present

## 2019-11-04 DIAGNOSIS — Z79899 Other long term (current) drug therapy: Secondary | ICD-10-CM | POA: Diagnosis not present

## 2019-11-04 DIAGNOSIS — G894 Chronic pain syndrome: Secondary | ICD-10-CM | POA: Diagnosis not present

## 2019-11-04 DIAGNOSIS — M961 Postlaminectomy syndrome, not elsewhere classified: Secondary | ICD-10-CM | POA: Diagnosis not present

## 2019-11-05 DIAGNOSIS — H524 Presbyopia: Secondary | ICD-10-CM | POA: Diagnosis not present

## 2019-11-05 DIAGNOSIS — H43813 Vitreous degeneration, bilateral: Secondary | ICD-10-CM | POA: Diagnosis not present

## 2019-11-05 DIAGNOSIS — H4720 Unspecified optic atrophy: Secondary | ICD-10-CM | POA: Diagnosis not present

## 2019-11-05 DIAGNOSIS — H04123 Dry eye syndrome of bilateral lacrimal glands: Secondary | ICD-10-CM | POA: Diagnosis not present

## 2019-11-12 ENCOUNTER — Ambulatory Visit: Payer: Medicare Other | Admitting: Physical Therapy

## 2019-11-17 ENCOUNTER — Other Ambulatory Visit: Payer: Self-pay

## 2019-11-17 ENCOUNTER — Ambulatory Visit: Payer: Medicare Other | Attending: Orthopedic Surgery | Admitting: Physical Therapy

## 2019-11-17 DIAGNOSIS — R262 Difficulty in walking, not elsewhere classified: Secondary | ICD-10-CM | POA: Diagnosis not present

## 2019-11-17 DIAGNOSIS — M25562 Pain in left knee: Secondary | ICD-10-CM | POA: Diagnosis not present

## 2019-11-17 DIAGNOSIS — M25511 Pain in right shoulder: Secondary | ICD-10-CM | POA: Insufficient documentation

## 2019-11-17 DIAGNOSIS — R293 Abnormal posture: Secondary | ICD-10-CM | POA: Diagnosis not present

## 2019-11-17 DIAGNOSIS — M25611 Stiffness of right shoulder, not elsewhere classified: Secondary | ICD-10-CM | POA: Diagnosis not present

## 2019-11-17 DIAGNOSIS — G8929 Other chronic pain: Secondary | ICD-10-CM | POA: Insufficient documentation
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
## 2019-11-18 ENCOUNTER — Ambulatory Visit: Payer: Medicare Other | Admitting: Physical Therapy

## 2019-11-18 DIAGNOSIS — R262 Difficulty in walking, not elsewhere classified: Secondary | ICD-10-CM | POA: Diagnosis not present

## 2019-11-18 DIAGNOSIS — M25562 Pain in left knee: Secondary | ICD-10-CM | POA: Diagnosis not present

## 2019-11-18 DIAGNOSIS — M25611 Stiffness of right shoulder, not elsewhere classified: Secondary | ICD-10-CM

## 2019-11-18 DIAGNOSIS — R293 Abnormal posture: Secondary | ICD-10-CM

## 2019-11-18 DIAGNOSIS — M25511 Pain in right shoulder: Secondary | ICD-10-CM | POA: Diagnosis not present

## 2019-11-18 DIAGNOSIS — G8929 Other chronic pain: Secondary | ICD-10-CM | POA: Diagnosis not present
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
## 2019-11-25 ENCOUNTER — Other Ambulatory Visit: Payer: Self-pay

## 2019-11-25 ENCOUNTER — Ambulatory Visit: Payer: Medicare Other | Attending: Orthopedic Surgery | Admitting: Physical Therapy

## 2019-11-25 DIAGNOSIS — M25562 Pain in left knee: Secondary | ICD-10-CM | POA: Diagnosis not present

## 2019-11-25 DIAGNOSIS — M25511 Pain in right shoulder: Secondary | ICD-10-CM

## 2019-11-25 DIAGNOSIS — R293 Abnormal posture: Secondary | ICD-10-CM | POA: Diagnosis not present

## 2019-11-25 DIAGNOSIS — G8929 Other chronic pain: Secondary | ICD-10-CM | POA: Insufficient documentation

## 2019-11-25 DIAGNOSIS — M25611 Stiffness of right shoulder, not elsewhere classified: Secondary | ICD-10-CM | POA: Insufficient documentation

## 2019-11-25 DIAGNOSIS — R262 Difficulty in walking, not elsewhere classified: Secondary | ICD-10-CM | POA: Diagnosis not present
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
## 2019-11-27 ENCOUNTER — Ambulatory Visit: Payer: Medicare Other | Admitting: Physical Therapy

## 2019-11-27 ENCOUNTER — Other Ambulatory Visit: Payer: Self-pay

## 2019-11-27 DIAGNOSIS — M25562 Pain in left knee: Secondary | ICD-10-CM | POA: Diagnosis not present

## 2019-11-27 DIAGNOSIS — G8929 Other chronic pain: Secondary | ICD-10-CM

## 2019-11-27 DIAGNOSIS — M25611 Stiffness of right shoulder, not elsewhere classified: Secondary | ICD-10-CM | POA: Diagnosis not present

## 2019-11-27 DIAGNOSIS — R262 Difficulty in walking, not elsewhere classified: Secondary | ICD-10-CM

## 2019-11-27 DIAGNOSIS — R293 Abnormal posture: Secondary | ICD-10-CM | POA: Diagnosis not present

## 2019-11-27 DIAGNOSIS — M25511 Pain in right shoulder: Secondary | ICD-10-CM | POA: Diagnosis not present
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
## 2019-12-02 DIAGNOSIS — M961 Postlaminectomy syndrome, not elsewhere classified: Secondary | ICD-10-CM | POA: Diagnosis not present

## 2019-12-02 DIAGNOSIS — M25519 Pain in unspecified shoulder: Secondary | ICD-10-CM | POA: Diagnosis not present

## 2019-12-02 DIAGNOSIS — M25569 Pain in unspecified knee: Secondary | ICD-10-CM | POA: Diagnosis not present

## 2019-12-02 DIAGNOSIS — G894 Chronic pain syndrome: Secondary | ICD-10-CM | POA: Diagnosis not present

## 2019-12-03 DIAGNOSIS — M25811 Other specified joint disorders, right shoulder: Secondary | ICD-10-CM | POA: Diagnosis not present

## 2019-12-03 DIAGNOSIS — M25511 Pain in right shoulder: Secondary | ICD-10-CM | POA: Diagnosis not present

## 2019-12-04 ENCOUNTER — Ambulatory Visit: Payer: Medicare Other | Admitting: Physical Therapy

## 2019-12-09 ENCOUNTER — Ambulatory Visit: Payer: Medicare Other | Admitting: Physical Therapy

## 2019-12-11 ENCOUNTER — Ambulatory Visit: Payer: Medicare Other | Admitting: Physical Therapy

## 2019-12-12 ENCOUNTER — Telehealth: Payer: Self-pay | Admitting: Cardiology

## 2019-12-12 NOTE — Telephone Encounter (Signed)
Returned call to patient-advised no samples available at this time.  Patient aware and verbalized understanding.

## 2019-12-12 NOTE — Telephone Encounter (Signed)
Patient calling the office for samples of medication:   1.  What medication and dosage are you requesting samples for? apixaban (ELIQUIS) 5 MG TABS tablet  2.  Are you currently out of this medication? Patient states she has enough medication to get her through the weekend.

## 2019-12-16 ENCOUNTER — Ambulatory Visit: Payer: Medicare Other | Admitting: Physical Therapy

## 2019-12-18 ENCOUNTER — Encounter: Payer: Medicare Other | Admitting: Physical Therapy

## 2019-12-18 NOTE — Telephone Encounter (Signed)
Left message on personal voice mail Eliquis 5 mg samples left at [facility] office front desk. 

## 2019-12-19 DIAGNOSIS — N390 Urinary tract infection, site not specified: Secondary | ICD-10-CM | POA: Diagnosis not present

## 2019-12-19 DIAGNOSIS — R410 Disorientation, unspecified: Secondary | ICD-10-CM | POA: Diagnosis not present

## 2019-12-19 DIAGNOSIS — R2681 Unsteadiness on feet: Secondary | ICD-10-CM | POA: Diagnosis not present

## 2019-12-19 DIAGNOSIS — N1831 Chronic kidney disease, stage 3a: Secondary | ICD-10-CM | POA: Diagnosis not present

## 2019-12-19 DIAGNOSIS — Z7901 Long term (current) use of anticoagulants: Secondary | ICD-10-CM | POA: Diagnosis not present

## 2019-12-19 DIAGNOSIS — F418 Other specified anxiety disorders: Secondary | ICD-10-CM | POA: Diagnosis not present

## 2019-12-19 DIAGNOSIS — I129 Hypertensive chronic kidney disease with stage 1 through stage 4 chronic kidney disease, or unspecified chronic kidney disease: Secondary | ICD-10-CM | POA: Diagnosis not present

## 2019-12-19 DIAGNOSIS — I4891 Unspecified atrial fibrillation: Secondary | ICD-10-CM | POA: Diagnosis not present

## 2019-12-23 ENCOUNTER — Ambulatory Visit: Payer: Medicare Other | Attending: Orthopedic Surgery | Admitting: Physical Therapy

## 2019-12-23 ENCOUNTER — Other Ambulatory Visit: Payer: Self-pay

## 2019-12-23 DIAGNOSIS — M25562 Pain in left knee: Secondary | ICD-10-CM | POA: Insufficient documentation

## 2019-12-23 DIAGNOSIS — R262 Difficulty in walking, not elsewhere classified: Secondary | ICD-10-CM | POA: Diagnosis not present

## 2019-12-23 DIAGNOSIS — R293 Abnormal posture: Secondary | ICD-10-CM | POA: Insufficient documentation

## 2019-12-23 DIAGNOSIS — M25511 Pain in right shoulder: Secondary | ICD-10-CM | POA: Diagnosis not present

## 2019-12-23 DIAGNOSIS — G8929 Other chronic pain: Secondary | ICD-10-CM | POA: Insufficient documentation

## 2019-12-23 DIAGNOSIS — M25611 Stiffness of right shoulder, not elsewhere classified: Secondary | ICD-10-CM | POA: Insufficient documentation
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
## 2019-12-25 ENCOUNTER — Ambulatory Visit: Payer: Medicare Other | Admitting: Physical Therapy

## 2019-12-25 ENCOUNTER — Other Ambulatory Visit: Payer: Self-pay

## 2019-12-25 DIAGNOSIS — R293 Abnormal posture: Secondary | ICD-10-CM | POA: Diagnosis not present

## 2019-12-25 DIAGNOSIS — R262 Difficulty in walking, not elsewhere classified: Secondary | ICD-10-CM

## 2019-12-25 DIAGNOSIS — M25562 Pain in left knee: Secondary | ICD-10-CM

## 2019-12-25 DIAGNOSIS — M25611 Stiffness of right shoulder, not elsewhere classified: Secondary | ICD-10-CM | POA: Diagnosis not present

## 2019-12-25 DIAGNOSIS — G8929 Other chronic pain: Secondary | ICD-10-CM

## 2019-12-25 DIAGNOSIS — M25511 Pain in right shoulder: Secondary | ICD-10-CM | POA: Diagnosis not present
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
## 2019-12-30 ENCOUNTER — Other Ambulatory Visit: Payer: Self-pay

## 2019-12-30 ENCOUNTER — Ambulatory Visit: Payer: Medicare Other | Admitting: Physical Therapy

## 2019-12-30 DIAGNOSIS — G8929 Other chronic pain: Secondary | ICD-10-CM

## 2019-12-30 DIAGNOSIS — M25511 Pain in right shoulder: Secondary | ICD-10-CM | POA: Diagnosis not present

## 2019-12-30 DIAGNOSIS — M25562 Pain in left knee: Secondary | ICD-10-CM | POA: Diagnosis not present

## 2019-12-30 DIAGNOSIS — R293 Abnormal posture: Secondary | ICD-10-CM

## 2019-12-30 DIAGNOSIS — M25611 Stiffness of right shoulder, not elsewhere classified: Secondary | ICD-10-CM

## 2019-12-30 DIAGNOSIS — R262 Difficulty in walking, not elsewhere classified: Secondary | ICD-10-CM
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
## 2020-01-01 ENCOUNTER — Ambulatory Visit: Payer: Medicare Other | Admitting: Physical Therapy

## 2020-01-06 ENCOUNTER — Encounter: Payer: Self-pay | Admitting: Physical Therapy

## 2020-01-06 ENCOUNTER — Ambulatory Visit: Payer: Medicare Other | Admitting: Physical Therapy

## 2020-01-06 ENCOUNTER — Other Ambulatory Visit: Payer: Self-pay

## 2020-01-06 DIAGNOSIS — G8929 Other chronic pain: Secondary | ICD-10-CM

## 2020-01-06 DIAGNOSIS — M25511 Pain in right shoulder: Secondary | ICD-10-CM | POA: Diagnosis not present

## 2020-01-06 DIAGNOSIS — M25611 Stiffness of right shoulder, not elsewhere classified: Secondary | ICD-10-CM | POA: Diagnosis not present

## 2020-01-06 DIAGNOSIS — R262 Difficulty in walking, not elsewhere classified: Secondary | ICD-10-CM | POA: Diagnosis not present

## 2020-01-06 DIAGNOSIS — R293 Abnormal posture: Secondary | ICD-10-CM

## 2020-01-06 DIAGNOSIS — M25562 Pain in left knee: Secondary | ICD-10-CM | POA: Diagnosis not present
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
## 2020-01-07 DIAGNOSIS — S43004A Unspecified dislocation of right shoulder joint, initial encounter: Secondary | ICD-10-CM | POA: Diagnosis not present

## 2020-01-07 DIAGNOSIS — M25511 Pain in right shoulder: Secondary | ICD-10-CM | POA: Diagnosis not present

## 2020-01-13 ENCOUNTER — Ambulatory Visit: Payer: Medicare Other | Admitting: Physical Therapy

## 2020-01-13 ENCOUNTER — Other Ambulatory Visit: Payer: Self-pay

## 2020-01-13 DIAGNOSIS — R262 Difficulty in walking, not elsewhere classified: Secondary | ICD-10-CM | POA: Diagnosis not present

## 2020-01-13 DIAGNOSIS — M25562 Pain in left knee: Secondary | ICD-10-CM | POA: Diagnosis not present

## 2020-01-13 DIAGNOSIS — G8929 Other chronic pain: Secondary | ICD-10-CM | POA: Diagnosis not present

## 2020-01-13 DIAGNOSIS — M961 Postlaminectomy syndrome, not elsewhere classified: Secondary | ICD-10-CM | POA: Diagnosis not present

## 2020-01-13 DIAGNOSIS — M25569 Pain in unspecified knee: Secondary | ICD-10-CM | POA: Diagnosis not present

## 2020-01-13 DIAGNOSIS — M25519 Pain in unspecified shoulder: Secondary | ICD-10-CM | POA: Diagnosis not present

## 2020-01-13 DIAGNOSIS — M25511 Pain in right shoulder: Secondary | ICD-10-CM

## 2020-01-13 DIAGNOSIS — R293 Abnormal posture: Secondary | ICD-10-CM

## 2020-01-13 DIAGNOSIS — G894 Chronic pain syndrome: Secondary | ICD-10-CM | POA: Diagnosis not present

## 2020-01-13 DIAGNOSIS — M25611 Stiffness of right shoulder, not elsewhere classified: Secondary | ICD-10-CM | POA: Diagnosis not present
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
## 2020-01-14 ENCOUNTER — Ambulatory Visit: Payer: Medicare Other | Admitting: Physical Therapy

## 2020-01-14 ENCOUNTER — Encounter: Payer: Self-pay | Admitting: Physical Therapy

## 2020-01-14 DIAGNOSIS — M25611 Stiffness of right shoulder, not elsewhere classified: Secondary | ICD-10-CM | POA: Diagnosis not present

## 2020-01-14 DIAGNOSIS — M25562 Pain in left knee: Secondary | ICD-10-CM

## 2020-01-14 DIAGNOSIS — M25511 Pain in right shoulder: Secondary | ICD-10-CM | POA: Diagnosis not present

## 2020-01-14 DIAGNOSIS — G8929 Other chronic pain: Secondary | ICD-10-CM | POA: Diagnosis not present

## 2020-01-14 DIAGNOSIS — M25561 Pain in right knee: Secondary | ICD-10-CM | POA: Diagnosis not present

## 2020-01-14 DIAGNOSIS — R293 Abnormal posture: Secondary | ICD-10-CM | POA: Diagnosis not present

## 2020-01-14 DIAGNOSIS — M1711 Unilateral primary osteoarthritis, right knee: Secondary | ICD-10-CM | POA: Diagnosis not present

## 2020-01-14 DIAGNOSIS — R262 Difficulty in walking, not elsewhere classified: Secondary | ICD-10-CM
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
## 2020-01-15 ENCOUNTER — Ambulatory Visit: Payer: Medicare Other | Admitting: Physical Therapy

## 2020-01-20 ENCOUNTER — Ambulatory Visit: Payer: Medicare Other | Admitting: Physical Therapy

## 2020-01-22 ENCOUNTER — Other Ambulatory Visit: Payer: Self-pay

## 2020-01-22 ENCOUNTER — Ambulatory Visit: Payer: Medicare Other | Attending: Orthopedic Surgery | Admitting: Physical Therapy

## 2020-01-22 DIAGNOSIS — M25562 Pain in left knee: Secondary | ICD-10-CM | POA: Insufficient documentation

## 2020-01-22 DIAGNOSIS — R262 Difficulty in walking, not elsewhere classified: Secondary | ICD-10-CM | POA: Insufficient documentation

## 2020-01-22 DIAGNOSIS — G8929 Other chronic pain: Secondary | ICD-10-CM | POA: Diagnosis present

## 2020-01-22 DIAGNOSIS — R293 Abnormal posture: Secondary | ICD-10-CM | POA: Insufficient documentation

## 2020-01-22 DIAGNOSIS — M25511 Pain in right shoulder: Secondary | ICD-10-CM | POA: Insufficient documentation

## 2020-01-22 DIAGNOSIS — M25611 Stiffness of right shoulder, not elsewhere classified: Secondary | ICD-10-CM | POA: Insufficient documentation
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
## 2020-01-27 ENCOUNTER — Ambulatory Visit: Payer: Medicare Other | Admitting: Physical Therapy

## 2020-01-29 ENCOUNTER — Other Ambulatory Visit: Payer: Self-pay

## 2020-01-29 ENCOUNTER — Ambulatory Visit: Payer: Medicare Other | Admitting: Physical Therapy

## 2020-01-29 DIAGNOSIS — M25562 Pain in left knee: Secondary | ICD-10-CM

## 2020-01-29 DIAGNOSIS — M25611 Stiffness of right shoulder, not elsewhere classified: Secondary | ICD-10-CM

## 2020-01-29 DIAGNOSIS — R293 Abnormal posture: Secondary | ICD-10-CM

## 2020-01-29 DIAGNOSIS — M25511 Pain in right shoulder: Secondary | ICD-10-CM | POA: Diagnosis not present

## 2020-01-29 DIAGNOSIS — R262 Difficulty in walking, not elsewhere classified: Secondary | ICD-10-CM

## 2020-01-29 DIAGNOSIS — G8929 Other chronic pain: Secondary | ICD-10-CM
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
## 2020-02-02 ENCOUNTER — Ambulatory Visit (INDEPENDENT_AMBULATORY_CARE_PROVIDER_SITE_OTHER): Payer: Medicare Other | Admitting: Internal Medicine

## 2020-02-02 ENCOUNTER — Encounter: Payer: Self-pay | Admitting: Internal Medicine

## 2020-02-02 VITALS — BP 130/82 | HR 88 | Ht 66.0 in | Wt 118.2 lb

## 2020-02-02 DIAGNOSIS — K5909 Other constipation: Secondary | ICD-10-CM

## 2020-02-02 DIAGNOSIS — R110 Nausea: Secondary | ICD-10-CM | POA: Diagnosis not present

## 2020-02-02 DIAGNOSIS — K224 Dyskinesia of esophagus: Secondary | ICD-10-CM

## 2020-02-02 DIAGNOSIS — K219 Gastro-esophageal reflux disease without esophagitis: Secondary | ICD-10-CM

## 2020-02-02 DIAGNOSIS — R21 Rash and other nonspecific skin eruption: Secondary | ICD-10-CM

## 2020-02-02 MED ORDER — POLYETHYLENE GLYCOL 3350 17 G PO PACK: 8.0000 g | PACK | Freq: Every day | ORAL | 0 refills | Status: DC

## 2020-02-02 MED ORDER — ONDANSETRON 4 MG PO TBDP: 4.0000 mg | ORAL_TABLET | Freq: Three times a day (TID) | ORAL | 1 refills | Status: DC | PRN

## 2020-02-02 NOTE — Progress Notes (Signed)
Subjective:    Patient ID: [name], female    DOB: 02-Nov-1945, 74 y.o.   MRN: [id number]  HPI Jeneen Doutt is a 74 year old female with past medical history of GERD, hiatal hernia with partial Schatzki's ring, history of gastric adenoma, history of peptic ulcer disease, pancreas divisum, chronic constipation with occasional overflow diarrhea, chronic nausea who is here for follow-up.  She is here today with her husband and I last saw her in the office on 09/30/2019 and then for upper endoscopy and colonoscopy on 10/22/2019.  She also has a history of atrial fibrillation on Eliquis, COPD, CKD, severe osteoarthritis, chronic pain, hypertension and hypothyroidism.  Her upper endoscopy and colonoscopy were performed on 10/22/2019 EGD showed a tortuous esophagus, 1 to 2 cm hiatal hernia with partial Schatzki's ring which was nonobstructing.  Mild diffuse erythema in the stomach which was biopsied.  No gastric polyps were found.  The examined duodenum was normal.  Pathology showed slight chronic inflammation without H. Pylori. Colonoscopy was normal.  She reports that overall she is doing much better.  She is using pantoprazole 40 mg twice a day and Zofran helped significantly with nausea as needed.  Overall her bowel movements are better regulated with MiraLAX for which she is using 8 g daily.  She will have 1 possibly 2 days a week where she will have looser stools.  She attributes this to having liberalized her diet.  Seems to be a little more common if she eats salads or fresh vegetables.  She has not had as much constipation.  No significant abdominal pain.  No vomiting.  No heartburn.  Very rare dysphagia which is transient.  She has gained about 10 pounds in the last 6 to 10 months for which she is pleased.  She notices some moisture and irritation on her abdominal skin particularly in a fold near the umbilicus.  Review of Systems As per HPI, otherwise negative  Current Medications, Allergies,  Past Medical History, Past Surgical History, Family History and Social History were reviewed in [employer] record.     Objective:   Physical Exam BP 130/82   Pulse 88   Ht 5\' 6"  (1.676 m)   Wt 118 lb 4 oz (53.6 kg)   BMI 19.09 kg/m  Gen: awake, alert, NAD HEENT: anicteric Abd: soft, NT/ND, +BS throughout Ext: no c/c/e Neuro: nonfocal      Assessment & Plan:  74 year old female with past medical history of GERD, hiatal hernia with partial Schatzki's ring, history of gastric adenoma, history of peptic ulcer disease, pancreas divisum, chronic constipation with occasional overflow diarrhea, chronic nausea who is here for follow-up.   1.  Chronic nausea/history of gastritis/history of gastric adenoma --EGD was rather reassuring and we reviewed these findings today.  Her nausea is currently well controlled and responds very well to ondansetron.  She is on twice daily PPI --Continue pantoprazole 40 mg twice daily --Continue ondansetron 4 mg every 8 hours as needed for nausea, she uses ODT version  2.  GERD/partial Schatzki's ring/esophageal tortuosity --mild and very intermittent dysphagia.  This is more likely due to esophageal dysmotility than her Schatzki's ring.  Continue PPI as above  3.  Chronic constipation --bowel habits have improved with MiraLAX 8 g daily.  She was having overflow diarrhea which we discussed again today.  I advised that she continue daily MiraLAX but if she goes 2 days without bowel movement, on the second day without  bowel movement she should take an additional dose of MiraLAX.  We discussed how diarrhea results once she has gone multiple days without a bowel movement. --MiraLAX 8 g once to twice daily --Can use Levsin 0.125 mg 1 to 2 tablets every 6 hours as needed for crampy abdominal discomfort  4.  Mild skin irritation --we discussed how moisture and warmth can lead to yeast/fungal irritation of the skin.  There is no evidence of  infection today.  I advise she use Goldbond talc powder on the skin and clotrimazole cream if needed for redness or discomfort.  Clotrimazole would be used intermittently and per over-the-counter box instruction.  5.  CRC screening --normal colonoscopy 2021, will not need repeat based on age  24 minutes total spent today including patient facing time, coordination of care, reviewing medical history/procedures/pertinent radiology studies, and documentation of the encounter.

## 2020-02-02 NOTE — Patient Instructions (Addendum)
If you are age 75 or older, your body mass index should be between 23-30. Your Body mass index is 19.09 kg/m. If this is out of the aforementioned range listed, please consider follow up with your Primary Care Provider.  If you are age 55 or younger, your body mass index should be between 19-25. Your Body mass index is 19.09 kg/m. If this is out of the aformentioned range listed, please consider follow up with your Primary Care Provider.   Continue pantoprazole 40mg  twice a day  Continue 1/2 dose of Miralax daily.  If no BM by day 2, increase to twice a day.  We have sent the following medications to your pharmacy for you to pick up at your convenience: Zofran  Please discontinue Reglan.  Please purchase the following medications over the counter and take as directed: Gold bond with talc or Clotrimazole cream - Use topically for skin irritation.  Thank you for entrusting me with your care and for choosing [facility], Dr. Zenovia Jarred

## 2020-02-03 ENCOUNTER — Other Ambulatory Visit: Payer: Self-pay

## 2020-02-03 ENCOUNTER — Ambulatory Visit: Payer: Medicare Other | Admitting: Physical Therapy

## 2020-02-03 DIAGNOSIS — M25562 Pain in left knee: Secondary | ICD-10-CM

## 2020-02-03 DIAGNOSIS — R293 Abnormal posture: Secondary | ICD-10-CM

## 2020-02-03 DIAGNOSIS — M25611 Stiffness of right shoulder, not elsewhere classified: Secondary | ICD-10-CM

## 2020-02-03 DIAGNOSIS — M25511 Pain in right shoulder: Secondary | ICD-10-CM | POA: Diagnosis not present

## 2020-02-03 DIAGNOSIS — G8929 Other chronic pain: Secondary | ICD-10-CM
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
## 2020-02-05 ENCOUNTER — Other Ambulatory Visit: Payer: Self-pay

## 2020-02-05 ENCOUNTER — Ambulatory Visit: Payer: Medicare Other | Admitting: Physical Therapy

## 2020-02-05 DIAGNOSIS — R293 Abnormal posture: Secondary | ICD-10-CM

## 2020-02-05 DIAGNOSIS — G8929 Other chronic pain: Secondary | ICD-10-CM

## 2020-02-05 DIAGNOSIS — M25511 Pain in right shoulder: Secondary | ICD-10-CM

## 2020-02-05 DIAGNOSIS — M25562 Pain in left knee: Secondary | ICD-10-CM

## 2020-02-05 DIAGNOSIS — R262 Difficulty in walking, not elsewhere classified: Secondary | ICD-10-CM
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
## 2020-02-06 DIAGNOSIS — H5340 Unspecified visual field defects: Secondary | ICD-10-CM | POA: Diagnosis not present

## 2020-02-12 ENCOUNTER — Ambulatory Visit: Payer: Medicare Other | Admitting: Physical Therapy

## 2020-02-17 ENCOUNTER — Other Ambulatory Visit: Payer: Self-pay

## 2020-02-17 ENCOUNTER — Ambulatory Visit: Payer: Medicare Other | Admitting: Physical Therapy

## 2020-02-17 DIAGNOSIS — G8929 Other chronic pain: Secondary | ICD-10-CM

## 2020-02-17 DIAGNOSIS — M25511 Pain in right shoulder: Secondary | ICD-10-CM | POA: Diagnosis not present

## 2020-02-17 DIAGNOSIS — R262 Difficulty in walking, not elsewhere classified: Secondary | ICD-10-CM

## 2020-02-17 DIAGNOSIS — R293 Abnormal posture: Secondary | ICD-10-CM
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
## 2020-02-19 ENCOUNTER — Other Ambulatory Visit: Payer: Self-pay

## 2020-02-19 ENCOUNTER — Ambulatory Visit: Payer: Medicare Other | Attending: Orthopedic Surgery | Admitting: Physical Therapy

## 2020-02-19 DIAGNOSIS — M25561 Pain in right knee: Secondary | ICD-10-CM | POA: Insufficient documentation

## 2020-02-19 DIAGNOSIS — M25611 Stiffness of right shoulder, not elsewhere classified: Secondary | ICD-10-CM | POA: Diagnosis not present

## 2020-02-19 DIAGNOSIS — G8929 Other chronic pain: Secondary | ICD-10-CM | POA: Diagnosis not present

## 2020-02-19 DIAGNOSIS — R293 Abnormal posture: Secondary | ICD-10-CM | POA: Diagnosis not present

## 2020-02-19 DIAGNOSIS — M25562 Pain in left knee: Secondary | ICD-10-CM | POA: Diagnosis not present

## 2020-02-19 DIAGNOSIS — R262 Difficulty in walking, not elsewhere classified: Secondary | ICD-10-CM | POA: Diagnosis not present

## 2020-02-19 DIAGNOSIS — M25511 Pain in right shoulder: Secondary | ICD-10-CM
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
## 2020-02-24 ENCOUNTER — Ambulatory Visit: Payer: Medicare Other | Admitting: Physical Therapy

## 2020-02-24 ENCOUNTER — Other Ambulatory Visit: Payer: Self-pay

## 2020-02-24 DIAGNOSIS — M25561 Pain in right knee: Secondary | ICD-10-CM | POA: Diagnosis not present

## 2020-02-24 DIAGNOSIS — G8929 Other chronic pain: Secondary | ICD-10-CM

## 2020-02-24 DIAGNOSIS — M25511 Pain in right shoulder: Secondary | ICD-10-CM | POA: Diagnosis not present

## 2020-02-24 DIAGNOSIS — R293 Abnormal posture: Secondary | ICD-10-CM

## 2020-02-24 DIAGNOSIS — M25562 Pain in left knee: Secondary | ICD-10-CM | POA: Diagnosis not present

## 2020-02-24 DIAGNOSIS — R262 Difficulty in walking, not elsewhere classified: Secondary | ICD-10-CM | POA: Diagnosis not present
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
## 2020-02-26 ENCOUNTER — Ambulatory Visit: Payer: Medicare Other | Admitting: Physical Therapy

## 2020-03-02 ENCOUNTER — Ambulatory Visit: Payer: Medicare Other | Admitting: Physical Therapy

## 2020-03-04 ENCOUNTER — Other Ambulatory Visit: Payer: Self-pay

## 2020-03-04 ENCOUNTER — Ambulatory Visit: Payer: Medicare Other | Admitting: Physical Therapy

## 2020-03-04 DIAGNOSIS — M25562 Pain in left knee: Secondary | ICD-10-CM | POA: Diagnosis not present

## 2020-03-04 DIAGNOSIS — R262 Difficulty in walking, not elsewhere classified: Secondary | ICD-10-CM | POA: Diagnosis not present

## 2020-03-04 DIAGNOSIS — G8929 Other chronic pain: Secondary | ICD-10-CM

## 2020-03-04 DIAGNOSIS — R293 Abnormal posture: Secondary | ICD-10-CM

## 2020-03-04 DIAGNOSIS — M25561 Pain in right knee: Secondary | ICD-10-CM | POA: Diagnosis not present

## 2020-03-04 DIAGNOSIS — M25511 Pain in right shoulder: Secondary | ICD-10-CM | POA: Diagnosis not present
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
## 2020-03-09 ENCOUNTER — Ambulatory Visit: Payer: Medicare Other | Admitting: Physical Therapy

## 2020-03-09 ENCOUNTER — Other Ambulatory Visit: Payer: Self-pay

## 2020-03-09 DIAGNOSIS — R262 Difficulty in walking, not elsewhere classified: Secondary | ICD-10-CM | POA: Diagnosis not present

## 2020-03-09 DIAGNOSIS — R293 Abnormal posture: Secondary | ICD-10-CM | POA: Diagnosis not present

## 2020-03-09 DIAGNOSIS — G8929 Other chronic pain: Secondary | ICD-10-CM | POA: Diagnosis not present

## 2020-03-09 DIAGNOSIS — M25511 Pain in right shoulder: Secondary | ICD-10-CM

## 2020-03-09 DIAGNOSIS — M25561 Pain in right knee: Secondary | ICD-10-CM | POA: Diagnosis not present

## 2020-03-09 DIAGNOSIS — M25562 Pain in left knee: Secondary | ICD-10-CM | POA: Diagnosis not present
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
## 2020-03-11 ENCOUNTER — Ambulatory Visit: Payer: Medicare Other | Admitting: Physical Therapy

## 2020-03-11 ENCOUNTER — Other Ambulatory Visit: Payer: Self-pay

## 2020-03-11 ENCOUNTER — Encounter: Payer: Self-pay | Admitting: Physical Therapy

## 2020-03-11 DIAGNOSIS — R293 Abnormal posture: Secondary | ICD-10-CM | POA: Diagnosis not present

## 2020-03-11 DIAGNOSIS — R262 Difficulty in walking, not elsewhere classified: Secondary | ICD-10-CM

## 2020-03-11 DIAGNOSIS — M25511 Pain in right shoulder: Secondary | ICD-10-CM

## 2020-03-11 DIAGNOSIS — G8929 Other chronic pain: Secondary | ICD-10-CM

## 2020-03-11 DIAGNOSIS — M25611 Stiffness of right shoulder, not elsewhere classified: Secondary | ICD-10-CM

## 2020-03-11 DIAGNOSIS — M25561 Pain in right knee: Secondary | ICD-10-CM | POA: Diagnosis not present

## 2020-03-11 DIAGNOSIS — M25562 Pain in left knee: Secondary | ICD-10-CM | POA: Diagnosis not present
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
## 2020-03-15 DIAGNOSIS — M47816 Spondylosis without myelopathy or radiculopathy, lumbar region: Secondary | ICD-10-CM | POA: Diagnosis not present

## 2020-03-16 ENCOUNTER — Ambulatory Visit: Payer: Medicare Other | Admitting: Physical Therapy

## 2020-03-18 ENCOUNTER — Ambulatory Visit: Payer: Medicare Other | Admitting: Physical Therapy

## 2020-03-19 DIAGNOSIS — R300 Dysuria: Secondary | ICD-10-CM | POA: Diagnosis not present

## 2020-03-19 DIAGNOSIS — N390 Urinary tract infection, site not specified: Secondary | ICD-10-CM | POA: Diagnosis not present

## 2020-03-23 ENCOUNTER — Ambulatory Visit: Payer: Medicare Other | Attending: Orthopedic Surgery | Admitting: Physical Therapy

## 2020-03-23 ENCOUNTER — Other Ambulatory Visit: Payer: Self-pay

## 2020-03-23 DIAGNOSIS — G8929 Other chronic pain: Secondary | ICD-10-CM | POA: Insufficient documentation

## 2020-03-23 DIAGNOSIS — R262 Difficulty in walking, not elsewhere classified: Secondary | ICD-10-CM | POA: Diagnosis not present

## 2020-03-23 DIAGNOSIS — R293 Abnormal posture: Secondary | ICD-10-CM

## 2020-03-23 DIAGNOSIS — M25562 Pain in left knee: Secondary | ICD-10-CM | POA: Diagnosis not present

## 2020-03-23 DIAGNOSIS — M25561 Pain in right knee: Secondary | ICD-10-CM | POA: Insufficient documentation
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
## 2020-03-25 ENCOUNTER — Other Ambulatory Visit: Payer: Self-pay

## 2020-03-25 ENCOUNTER — Ambulatory Visit: Payer: Medicare Other | Admitting: Physical Therapy

## 2020-03-25 DIAGNOSIS — G8929 Other chronic pain: Secondary | ICD-10-CM

## 2020-03-25 DIAGNOSIS — R293 Abnormal posture: Secondary | ICD-10-CM

## 2020-03-25 DIAGNOSIS — M25561 Pain in right knee: Secondary | ICD-10-CM | POA: Diagnosis not present

## 2020-03-25 DIAGNOSIS — M25562 Pain in left knee: Secondary | ICD-10-CM | POA: Diagnosis not present

## 2020-03-25 DIAGNOSIS — R262 Difficulty in walking, not elsewhere classified: Secondary | ICD-10-CM
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
## 2020-03-26 ENCOUNTER — Telehealth: Payer: Self-pay | Admitting: Cardiology

## 2020-03-26 NOTE — Telephone Encounter (Signed)
LVM for patient to return call to get follow up scheduled with [place] from patients recall list

## 2020-03-30 ENCOUNTER — Other Ambulatory Visit: Payer: Self-pay

## 2020-03-30 ENCOUNTER — Ambulatory Visit: Payer: Medicare Other | Admitting: Physical Therapy

## 2020-03-30 DIAGNOSIS — R293 Abnormal posture: Secondary | ICD-10-CM | POA: Diagnosis not present

## 2020-03-30 DIAGNOSIS — M25561 Pain in right knee: Secondary | ICD-10-CM | POA: Diagnosis not present

## 2020-03-30 DIAGNOSIS — M25562 Pain in left knee: Secondary | ICD-10-CM | POA: Diagnosis not present

## 2020-03-30 DIAGNOSIS — R262 Difficulty in walking, not elsewhere classified: Secondary | ICD-10-CM | POA: Diagnosis not present

## 2020-03-30 DIAGNOSIS — G8929 Other chronic pain: Secondary | ICD-10-CM | POA: Diagnosis not present
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
## 2020-03-31 ENCOUNTER — Telehealth: Payer: Self-pay | Admitting: Cardiology

## 2020-03-31 NOTE — Telephone Encounter (Signed)
Returned call to patient Eliquis 5 mg samples left at [facility] office front desk. 

## 2020-03-31 NOTE — Telephone Encounter (Signed)
Patient calling the office for samples of medication:   1.  What medication and dosage are you requesting samples for? apixaban (ELIQUIS) 5 MG TABS tablet  2.  Are you currently out of this medication? No    

## 2020-04-02 DIAGNOSIS — S42021A Displaced fracture of shaft of right clavicle, initial encounter for closed fracture: Secondary | ICD-10-CM | POA: Diagnosis not present

## 2020-04-02 DIAGNOSIS — M25511 Pain in right shoulder: Secondary | ICD-10-CM | POA: Diagnosis not present

## 2020-04-07 IMAGING — CT CT ABDOMEN AND PELVIS WITH CONTRAST
2 of 5 series · 14 of 46 positions shown, 16 images · IV contrast (APPLIED)
Comparison: CT dated May 02, 2011.
COMPARISON: CT dated May 02, 2011.

Addendum:
CLINICAL DATA: Abdominal pain

EXAM:
CT ABDOMEN AND PELVIS WITH CONTRAST
TECHNIQUE: Multidetector CT imaging of the abdomen and pelvis was performed
using the standard protocol following bolus administration of
intravenous contrast.
CONTRAST:  100mL OMNIPAQUE IOHEXOL 300 MG/ML  SOLN

[Series 3: abd/ pelvis 5.0 i30f 2 · axial · 0.67mm/px · z∈[+934,+1274]mm · 11 of 78 slices shown, 13 images]
[im 5/78  soft-tissue]
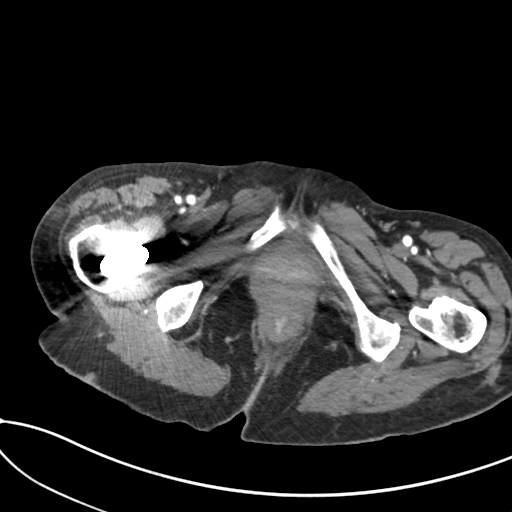
[im 5/78  bone]
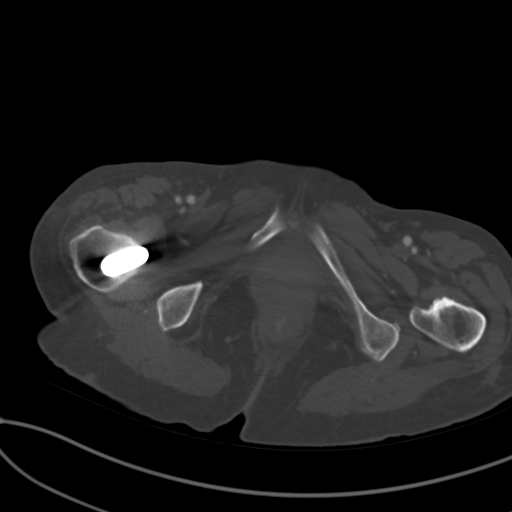
[im 14/78  soft-tissue]
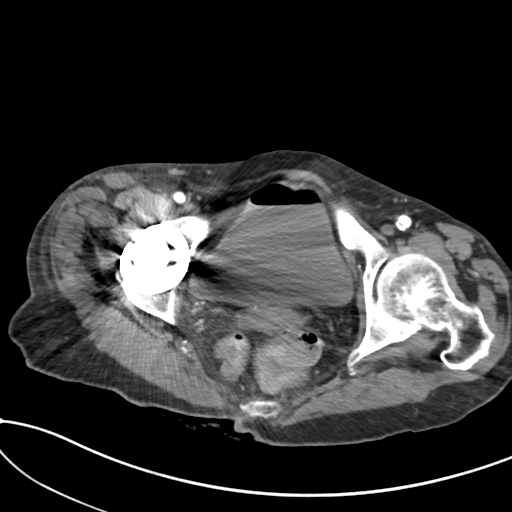
[im 19/78  soft-tissue]
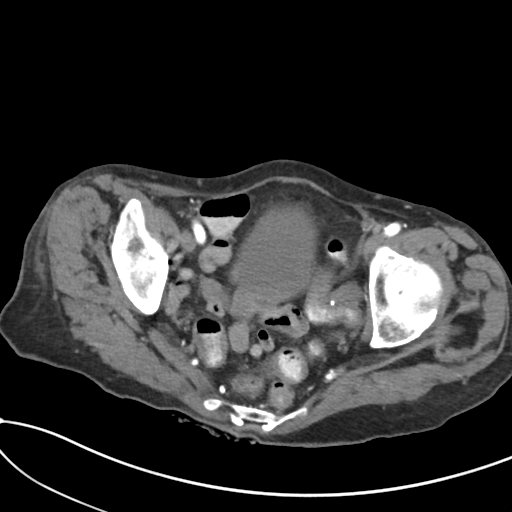
[im 28/78  soft-tissue]
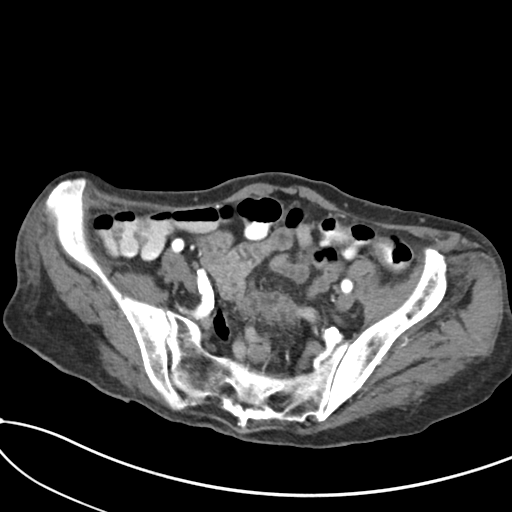
[im 32/78  soft-tissue]
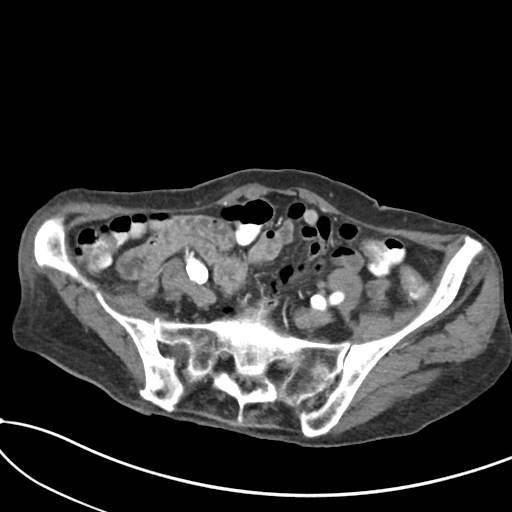
[im 41/78  soft-tissue]
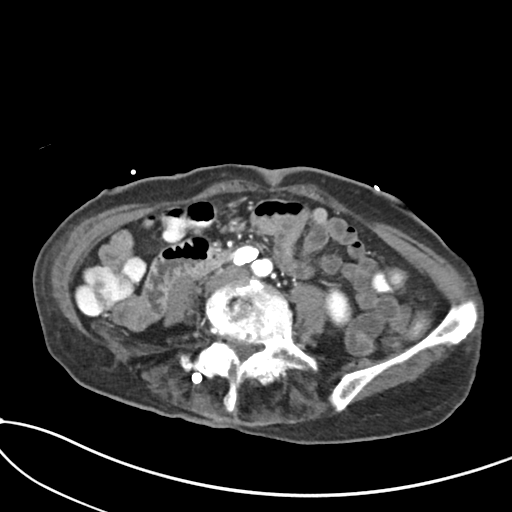
[im 46/78  soft-tissue]
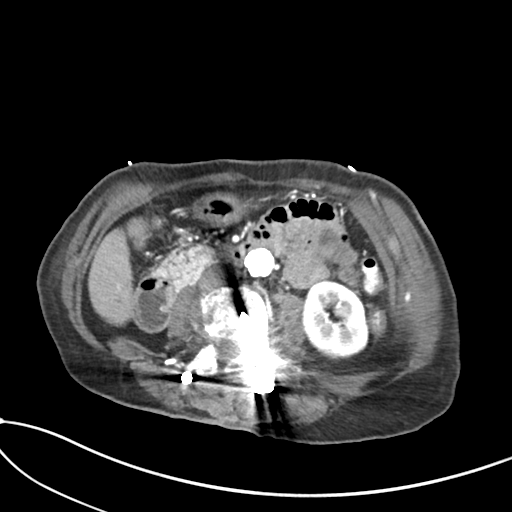
[im 50/78  soft-tissue]
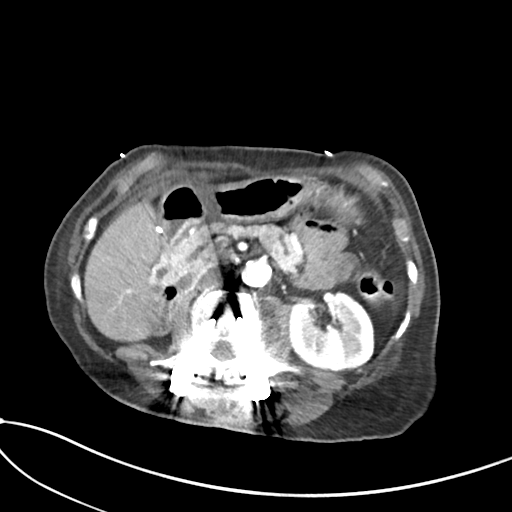
[im 59/78  soft-tissue]
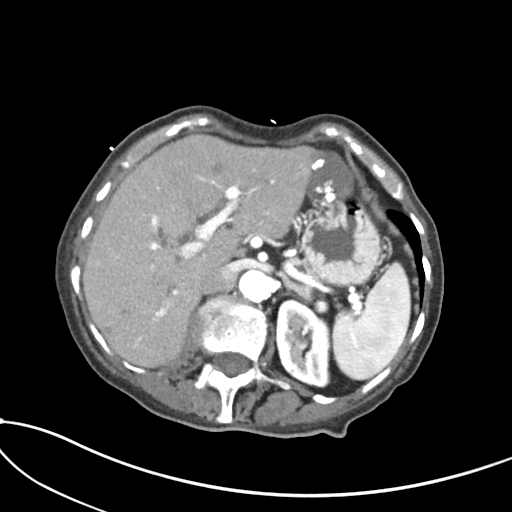
[im 59/78  bone]
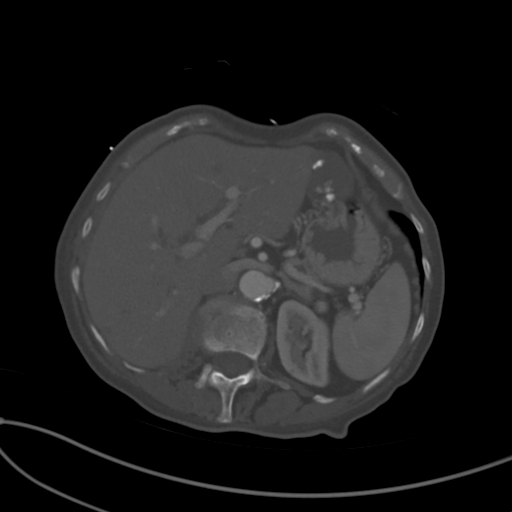
[im 64/78  soft-tissue]
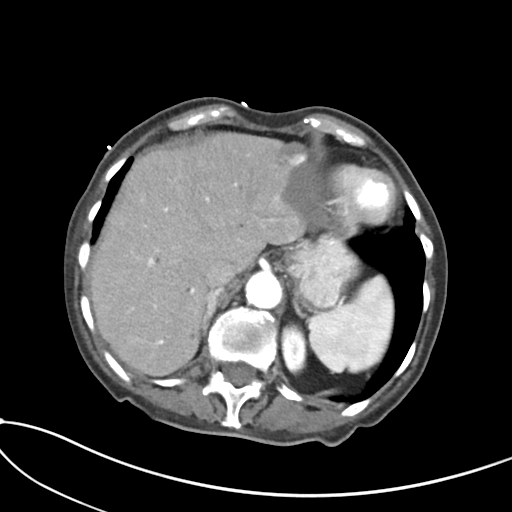
[im 73/78  soft-tissue]
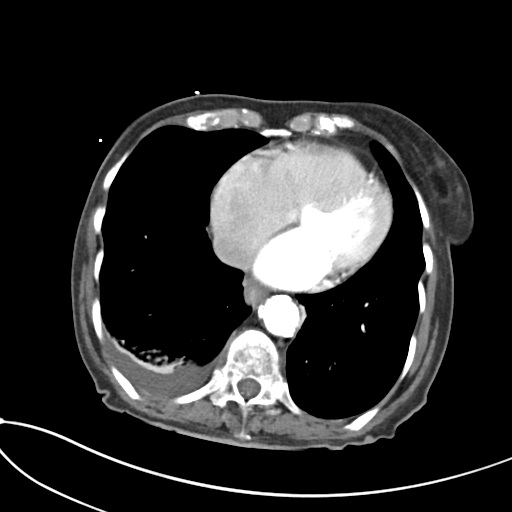

[Series 6: coronal soft tissue · coronal · 0.72mm/px · 3 of 76 slices shown]
[im 26/76  soft-tissue]
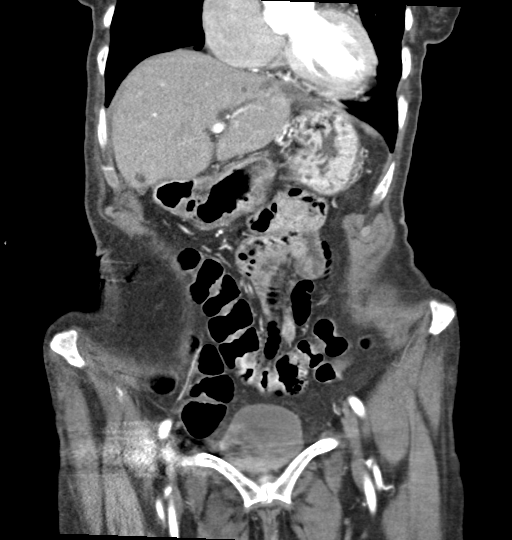
[im 34/76  soft-tissue]
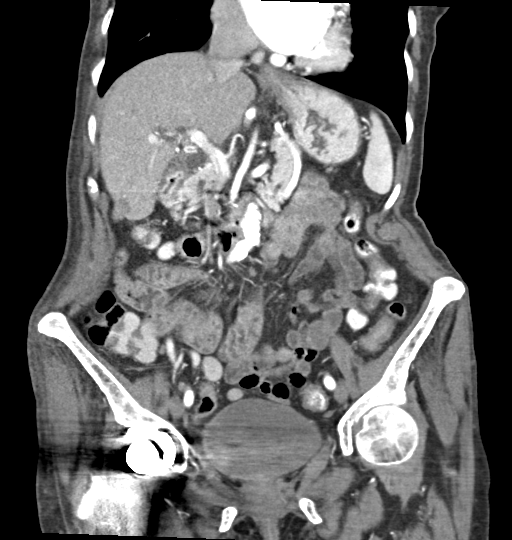
[im 42/76  soft-tissue]
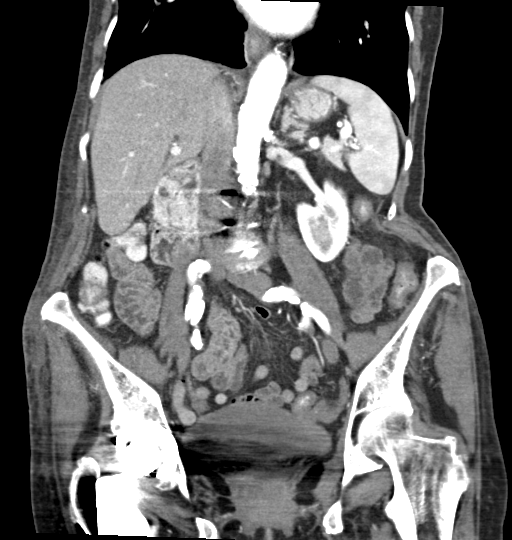

[14 of 46 positions shown; findings below may reference images not displayed]

FINDINGS: Lower chest: There is a small right-sided pleural effusion with
adjacent compressive atelectasis. The heart size is
mild-to-moderately enlarged.

Hepatobiliary: There are innumerable small cystic structures
throughout the liver that are too small to characterize but are
statistically most likely to represent benign cysts. The patient is
status post prior cholecystectomy. There is mild intrahepatic and
extrahepatic biliary ductal dilatation. Again identified is a large
hypoattenuating partially calcified mass in the left hepatic lobe.
This is favored to represent a benign hepatic hemangioma.

Pancreas: The pancreatic duct is dilated. There is no discrete
pancreatic mass.

Spleen: Normal in size without focal abnormality.

Adrenals/Urinary Tract: The right kidney is absent. The right
adrenal gland is not well visualized. The left adrenal gland is
unremarkable. The left kidney is unremarkable. There is a moderate
amount of gas in the urinary bladder.

Stomach/Bowel: There is oral contrast the level of the rectum. There
is mild diffuse wall thickening of the esophagus. The stomach is
grossly unremarkable. There is no evidence of a small-bowel
obstruction. No CT evidence of colitis.

Vascular/Lymphatic: Diffuse atherosclerotic changes are noted of the
abdominal aorta. Again noted is a left renal artery aneurysm
measuring approximately 1.3 cm. This is stable from prior CT in
3153.

Reproductive: Status post hysterectomy. No adnexal masses.

Other: No abdominal wall hernia or abnormality. No abdominopelvic
ascites.

Musculoskeletal: No fracture is seen.
IMPRESSION: 1. No acute intra-abdominal abnormality detected.
2. Small partially visualized right-sided pleural effusion.
3. Status post cholecystectomy. There is intrahepatic and
extrahepatic biliary ductal dilatation. There is pancreatic ductal
dilatation. Correlation with laboratory studies is recommended. If
there is clinical concern for an obstructing process, follow-up with
outpatient MRCP or ERCP is recommended.
4. A 1.3 cm left renal artery aneurysm is noted. This is essentially
stable since 3153.

Aortic Atherosclerosis ([id number]-C8Q.Q).

ADDENDUM:
There is mild diffuse wall thickening of the esophagus which can be
seen in patients with esophagitis.

*** End of Addendum ***
FINDINGS: Lower chest: There is a small right-sided pleural effusion with
adjacent compressive atelectasis. The heart size is
mild-to-moderately enlarged.

Hepatobiliary: There are innumerable small cystic structures
throughout the liver that are too small to characterize but are
statistically most likely to represent benign cysts. The patient is
status post prior cholecystectomy. There is mild intrahepatic and
extrahepatic biliary ductal dilatation. Again identified is a large
hypoattenuating partially calcified mass in the left hepatic lobe.
This is favored to represent a benign hepatic hemangioma.

Pancreas: The pancreatic duct is dilated. There is no discrete
pancreatic mass.

Spleen: Normal in size without focal abnormality.

Adrenals/Urinary Tract: The right kidney is absent. The right
adrenal gland is not well visualized. The left adrenal gland is
unremarkable. The left kidney is unremarkable. There is a moderate
amount of gas in the urinary bladder.

Stomach/Bowel: There is oral contrast the level of the rectum. There
is mild diffuse wall thickening of the esophagus. The stomach is
grossly unremarkable. There is no evidence of a small-bowel
obstruction. No CT evidence of colitis.

Vascular/Lymphatic: Diffuse atherosclerotic changes are noted of the
abdominal aorta. Again noted is a left renal artery aneurysm
measuring approximately 1.3 cm. This is stable from prior CT in
3153.

Reproductive: Status post hysterectomy. No adnexal masses.

Other: No abdominal wall hernia or abnormality. No abdominopelvic
ascites.

Musculoskeletal: No fracture is seen.
IMPRESSION: 1. No acute intra-abdominal abnormality detected.
2. Small partially visualized right-sided pleural effusion.
3. Status post cholecystectomy. There is intrahepatic and
extrahepatic biliary ductal dilatation. There is pancreatic ductal
dilatation. Correlation with laboratory studies is recommended. If
there is clinical concern for an obstructing process, follow-up with
outpatient MRCP or ERCP is recommended.
4. A 1.3 cm left renal artery aneurysm is noted. This is essentially
stable since 3153.

Aortic Atherosclerosis ([id number]-C8Q.Q).

## 2020-04-09 DIAGNOSIS — S42021D Displaced fracture of shaft of right clavicle, subsequent encounter for fracture with routine healing: Secondary | ICD-10-CM | POA: Diagnosis not present

## 2020-04-15 DIAGNOSIS — M4035 Flatback syndrome, thoracolumbar region: Secondary | ICD-10-CM | POA: Diagnosis not present

## 2020-04-15 DIAGNOSIS — M47816 Spondylosis without myelopathy or radiculopathy, lumbar region: Secondary | ICD-10-CM | POA: Diagnosis not present

## 2020-04-28 ENCOUNTER — Ambulatory Visit: Payer: Medicare Other | Attending: Orthopedic Surgery | Admitting: Physical Therapy

## 2020-04-28 DIAGNOSIS — M25561 Pain in right knee: Secondary | ICD-10-CM | POA: Insufficient documentation

## 2020-04-28 DIAGNOSIS — G8929 Other chronic pain: Secondary | ICD-10-CM | POA: Insufficient documentation

## 2020-04-28 DIAGNOSIS — N390 Urinary tract infection, site not specified: Secondary | ICD-10-CM | POA: Diagnosis not present

## 2020-04-28 DIAGNOSIS — M546 Pain in thoracic spine: Secondary | ICD-10-CM | POA: Diagnosis not present

## 2020-04-28 DIAGNOSIS — I129 Hypertensive chronic kidney disease with stage 1 through stage 4 chronic kidney disease, or unspecified chronic kidney disease: Secondary | ICD-10-CM | POA: Diagnosis not present

## 2020-04-28 DIAGNOSIS — G609 Hereditary and idiopathic neuropathy, unspecified: Secondary | ICD-10-CM | POA: Diagnosis not present

## 2020-04-28 DIAGNOSIS — N23 Unspecified renal colic: Secondary | ICD-10-CM | POA: Diagnosis not present

## 2020-04-28 DIAGNOSIS — R609 Edema, unspecified: Secondary | ICD-10-CM | POA: Diagnosis not present

## 2020-04-28 DIAGNOSIS — R202 Paresthesia of skin: Secondary | ICD-10-CM | POA: Diagnosis not present

## 2020-04-28 DIAGNOSIS — R262 Difficulty in walking, not elsewhere classified: Secondary | ICD-10-CM | POA: Insufficient documentation

## 2020-04-28 DIAGNOSIS — N1831 Chronic kidney disease, stage 3a: Secondary | ICD-10-CM | POA: Diagnosis not present

## 2020-04-28 DIAGNOSIS — R293 Abnormal posture: Secondary | ICD-10-CM | POA: Insufficient documentation

## 2020-04-28 DIAGNOSIS — R296 Repeated falls: Secondary | ICD-10-CM | POA: Insufficient documentation

## 2020-04-28 DIAGNOSIS — I4891 Unspecified atrial fibrillation: Secondary | ICD-10-CM | POA: Diagnosis not present

## 2020-04-30 DIAGNOSIS — S42021A Displaced fracture of shaft of right clavicle, initial encounter for closed fracture: Secondary | ICD-10-CM | POA: Diagnosis not present

## 2020-05-04 ENCOUNTER — Encounter: Payer: Self-pay | Admitting: Physical Therapy

## 2020-05-04 ENCOUNTER — Other Ambulatory Visit: Payer: Self-pay

## 2020-05-04 ENCOUNTER — Ambulatory Visit: Payer: Medicare Other | Admitting: Physical Therapy

## 2020-05-04 DIAGNOSIS — R262 Difficulty in walking, not elsewhere classified: Secondary | ICD-10-CM

## 2020-05-04 DIAGNOSIS — G8929 Other chronic pain: Secondary | ICD-10-CM

## 2020-05-04 DIAGNOSIS — R296 Repeated falls: Secondary | ICD-10-CM

## 2020-05-04 DIAGNOSIS — R293 Abnormal posture: Secondary | ICD-10-CM | POA: Diagnosis not present

## 2020-05-04 DIAGNOSIS — M25561 Pain in right knee: Secondary | ICD-10-CM | POA: Diagnosis not present
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
## 2020-05-05 DIAGNOSIS — M1711 Unilateral primary osteoarthritis, right knee: Secondary | ICD-10-CM | POA: Diagnosis not present

## 2020-05-05 DIAGNOSIS — M25561 Pain in right knee: Secondary | ICD-10-CM | POA: Diagnosis not present

## 2020-05-06 ENCOUNTER — Other Ambulatory Visit: Payer: Self-pay

## 2020-05-06 ENCOUNTER — Ambulatory Visit: Payer: Medicare Other | Admitting: Physical Therapy

## 2020-05-06 ENCOUNTER — Encounter: Payer: Self-pay | Admitting: Physical Therapy

## 2020-05-06 DIAGNOSIS — G8929 Other chronic pain: Secondary | ICD-10-CM

## 2020-05-06 DIAGNOSIS — R262 Difficulty in walking, not elsewhere classified: Secondary | ICD-10-CM | POA: Diagnosis not present

## 2020-05-06 DIAGNOSIS — R293 Abnormal posture: Secondary | ICD-10-CM

## 2020-05-06 DIAGNOSIS — R296 Repeated falls: Secondary | ICD-10-CM

## 2020-05-06 DIAGNOSIS — M25561 Pain in right knee: Secondary | ICD-10-CM | POA: Diagnosis not present
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
## 2020-05-11 ENCOUNTER — Ambulatory Visit: Payer: Medicare Other | Admitting: Physical Therapy

## 2020-05-11 ENCOUNTER — Ambulatory Visit (INDEPENDENT_AMBULATORY_CARE_PROVIDER_SITE_OTHER): Payer: Medicare Other | Admitting: Gastroenterology

## 2020-05-11 ENCOUNTER — Encounter: Payer: Self-pay | Admitting: Gastroenterology

## 2020-05-11 ENCOUNTER — Other Ambulatory Visit (INDEPENDENT_AMBULATORY_CARE_PROVIDER_SITE_OTHER): Payer: Medicare Other

## 2020-05-11 VITALS — BP 134/80 | HR 80 | Ht 66.0 in | Wt 118.1 lb

## 2020-05-11 DIAGNOSIS — R110 Nausea: Secondary | ICD-10-CM

## 2020-05-11 DIAGNOSIS — K8689 Other specified diseases of pancreas: Secondary | ICD-10-CM | POA: Diagnosis not present

## 2020-05-11 LAB — CBC WITH DIFFERENTIAL/PLATELET
Basophils Absolute: 0.1 10*3/uL (ref 0.0–0.1)
Basophils Relative: 0.7 % (ref 0.0–3.0)
Eosinophils Absolute: 0.2 10*3/uL (ref 0.0–0.7)
Eosinophils Relative: 1.6 % (ref 0.0–5.0)
HCT: 41.2 % (ref 36.0–46.0)
Hemoglobin: 13.8 g/dL (ref 12.0–15.0)
Lymphocytes Relative: 29.2 % (ref 12.0–46.0)
Lymphs Abs: 2.9 10*3/uL (ref 0.7–4.0)
MCHC: 33.6 g/dL (ref 30.0–36.0)
MCV: 91.2 fl (ref 78.0–100.0)
Monocytes Absolute: 0.7 10*3/uL (ref 0.1–1.0)
Monocytes Relative: 6.7 % (ref 3.0–12.0)
Neutro Abs: 6.2 10*3/uL (ref 1.4–7.7)
Neutrophils Relative %: 61.8 % (ref 43.0–77.0)
Platelets: 291 10*3/uL (ref 150.0–400.0)
RBC: 4.52 Mil/uL (ref 3.87–5.11)
RDW: 14 % (ref 11.5–15.5)
WBC: 10.1 10*3/uL (ref 4.0–10.5)

## 2020-05-11 LAB — COMPREHENSIVE METABOLIC PANEL
ALT: 6 U/L (ref 0–35)
AST: 10 U/L (ref 0–37)
Albumin: 4.5 g/dL (ref 3.5–5.2)
Alkaline Phosphatase: 84 U/L (ref 39–117)
BUN: 15 mg/dL (ref 6–23)
CO2: 35 mEq/L — ABNORMAL HIGH (ref 19–32)
Calcium: 10.2 mg/dL (ref 8.4–10.5)
Chloride: 98 mEq/L (ref 96–112)
Creatinine, Ser: 1.07 mg/dL (ref 0.40–1.20)
GFR: 49.99 mL/min — ABNORMAL LOW (ref >60.00)
Glucose, Bld: 90 mg/dL (ref 70–99)
Potassium: 3.4 mEq/L — ABNORMAL LOW (ref 3.5–5.1)
Sodium: 139 mEq/L (ref 135–145)
Total Bilirubin: 0.6 mg/dL (ref 0.2–1.2)
Total Protein: 7.1 g/dL (ref 6.0–8.3)

## 2020-05-11 LAB — LIPASE: Lipase: 19 U/L (ref 11.0–59.0)
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
## 2020-05-11 NOTE — Patient Instructions (Signed)
If you are age 76 or older, your body mass index should be between 23-30. Your Body mass index is 19.07 kg/m. If this is out of the aforementioned range listed, please consider follow up with your Primary Care Provider.  If you are age 68 or younger, your body mass index should be between 19-25. Your Body mass index is 19.07 kg/m. If this is out of the aformentioned range listed, please consider follow up with your Primary Care Provider.   Your provider has requested that you go to the basement level for lab work before leaving today. Press "B" on the elevator. The lab is located at the first door on the left as you exit the elevator.  You have been scheduled for an MRI at [facility], located at [place]. [name] in the [facility]. Your appointment is scheduled on 05/19/20 at 12:00 pm. Please arrive 30 minutes prior to your appointment time for registration purposes. Please make certain not to have anything to eat or drink 6 hours prior to your test. In addition, if you have any metal in your body, have a pacemaker or defibrillator, please be sure to let your ordering physician know. This test typically takes 45 minutes to 1 hour to complete. Should you need to reschedule, please call [phone].  We will be sending a referral to Dr. Derrill Kay at [facility]. Please allow 2 weeks.   Follow up pending at this time.

## 2020-05-12 DIAGNOSIS — M5136 Other intervertebral disc degeneration, lumbar region: Secondary | ICD-10-CM | POA: Diagnosis not present

## 2020-05-12 DIAGNOSIS — M47816 Spondylosis without myelopathy or radiculopathy, lumbar region: Secondary | ICD-10-CM | POA: Diagnosis not present

## 2020-05-12 DIAGNOSIS — M4035 Flatback syndrome, thoracolumbar region: Secondary | ICD-10-CM | POA: Diagnosis not present

## 2020-05-13 ENCOUNTER — Other Ambulatory Visit: Payer: Self-pay

## 2020-05-13 ENCOUNTER — Ambulatory Visit: Payer: Medicare Other | Admitting: Physical Therapy

## 2020-05-13 DIAGNOSIS — R293 Abnormal posture: Secondary | ICD-10-CM

## 2020-05-13 DIAGNOSIS — R296 Repeated falls: Secondary | ICD-10-CM | POA: Diagnosis not present

## 2020-05-13 DIAGNOSIS — G8929 Other chronic pain: Secondary | ICD-10-CM | POA: Diagnosis not present

## 2020-05-13 DIAGNOSIS — R262 Difficulty in walking, not elsewhere classified: Secondary | ICD-10-CM | POA: Diagnosis not present

## 2020-05-13 DIAGNOSIS — M25561 Pain in right knee: Secondary | ICD-10-CM | POA: Diagnosis not present
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
## 2020-05-18 ENCOUNTER — Other Ambulatory Visit: Payer: Self-pay

## 2020-05-18 ENCOUNTER — Ambulatory Visit: Payer: Medicare Other | Admitting: Physical Therapy

## 2020-05-18 DIAGNOSIS — R296 Repeated falls: Secondary | ICD-10-CM

## 2020-05-18 DIAGNOSIS — R293 Abnormal posture: Secondary | ICD-10-CM

## 2020-05-18 DIAGNOSIS — R262 Difficulty in walking, not elsewhere classified: Secondary | ICD-10-CM

## 2020-05-18 DIAGNOSIS — M25561 Pain in right knee: Secondary | ICD-10-CM | POA: Diagnosis not present

## 2020-05-18 DIAGNOSIS — G8929 Other chronic pain: Secondary | ICD-10-CM
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
## 2020-05-19 ENCOUNTER — Ambulatory Visit (HOSPITAL_COMMUNITY): Admission: RE | Admit: 2020-05-19 | Payer: Medicare Other | Source: Ambulatory Visit

## 2020-05-19 ENCOUNTER — Other Ambulatory Visit: Payer: Self-pay | Admitting: Gastroenterology

## 2020-05-19 DIAGNOSIS — K8689 Other specified diseases of pancreas: Secondary | ICD-10-CM

## 2020-05-19 DIAGNOSIS — R110 Nausea: Secondary | ICD-10-CM

## 2020-05-20 ENCOUNTER — Ambulatory Visit: Payer: Medicare Other | Admitting: Physical Therapy

## 2020-05-20 ENCOUNTER — Other Ambulatory Visit: Payer: Self-pay

## 2020-05-20 DIAGNOSIS — R296 Repeated falls: Secondary | ICD-10-CM | POA: Diagnosis not present

## 2020-05-20 DIAGNOSIS — R293 Abnormal posture: Secondary | ICD-10-CM | POA: Diagnosis not present

## 2020-05-20 DIAGNOSIS — G8929 Other chronic pain: Secondary | ICD-10-CM | POA: Diagnosis not present

## 2020-05-20 DIAGNOSIS — R262 Difficulty in walking, not elsewhere classified: Secondary | ICD-10-CM

## 2020-05-20 DIAGNOSIS — M25561 Pain in right knee: Secondary | ICD-10-CM | POA: Diagnosis not present
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
## 2020-05-21 NOTE — Progress Notes (Signed)
Addendum: Reviewed and agree with assessment and management plan. Bensen Chadderdon M, MD  

## 2020-05-25 ENCOUNTER — Ambulatory Visit: Payer: Medicare Other | Attending: Orthopedic Surgery | Admitting: Physical Therapy

## 2020-05-25 ENCOUNTER — Other Ambulatory Visit: Payer: Self-pay

## 2020-05-25 DIAGNOSIS — M25561 Pain in right knee: Secondary | ICD-10-CM | POA: Insufficient documentation

## 2020-05-25 DIAGNOSIS — R293 Abnormal posture: Secondary | ICD-10-CM | POA: Diagnosis not present

## 2020-05-25 DIAGNOSIS — R262 Difficulty in walking, not elsewhere classified: Secondary | ICD-10-CM | POA: Insufficient documentation

## 2020-05-25 DIAGNOSIS — G8929 Other chronic pain: Secondary | ICD-10-CM | POA: Diagnosis not present
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
## 2020-05-26 ENCOUNTER — Ambulatory Visit (HOSPITAL_COMMUNITY): Admission: RE | Admit: 2020-05-26 | Payer: Medicare Other | Source: Ambulatory Visit

## 2020-05-26 DIAGNOSIS — M5136 Other intervertebral disc degeneration, lumbar region: Secondary | ICD-10-CM | POA: Diagnosis not present

## 2020-05-27 ENCOUNTER — Emergency Department (HOSPITAL_COMMUNITY): Payer: Medicare Other

## 2020-05-27 ENCOUNTER — Ambulatory Visit: Payer: Medicare Other | Admitting: Physical Therapy

## 2020-05-27 ENCOUNTER — Emergency Department (HOSPITAL_COMMUNITY)
Admission: EM | Admit: 2020-05-27 | Discharge: 2020-05-27 | Disposition: A | Discharge status: Home / Self Care | Payer: Medicare Other | Source: Home / Self Care | Attending: Emergency Medicine | Admitting: Emergency Medicine

## 2020-05-27 ENCOUNTER — Encounter (HOSPITAL_COMMUNITY): Payer: Self-pay

## 2020-05-27 DIAGNOSIS — E876 Hypokalemia: Secondary | ICD-10-CM | POA: Diagnosis not present

## 2020-05-27 DIAGNOSIS — R300 Dysuria: Secondary | ICD-10-CM | POA: Insufficient documentation

## 2020-05-27 DIAGNOSIS — Z7901 Long term (current) use of anticoagulants: Secondary | ICD-10-CM | POA: Insufficient documentation

## 2020-05-27 DIAGNOSIS — Z8505 Personal history of malignant neoplasm of liver: Secondary | ICD-10-CM | POA: Insufficient documentation

## 2020-05-27 DIAGNOSIS — E039 Hypothyroidism, unspecified: Secondary | ICD-10-CM | POA: Insufficient documentation

## 2020-05-27 DIAGNOSIS — R61 Generalized hyperhidrosis: Secondary | ICD-10-CM | POA: Insufficient documentation

## 2020-05-27 DIAGNOSIS — R4781 Slurred speech: Secondary | ICD-10-CM | POA: Insufficient documentation

## 2020-05-27 DIAGNOSIS — I4819 Other persistent atrial fibrillation: Secondary | ICD-10-CM | POA: Diagnosis not present

## 2020-05-27 DIAGNOSIS — R4182 Altered mental status, unspecified: Secondary | ICD-10-CM

## 2020-05-27 DIAGNOSIS — T50915A Adverse effect of multiple unspecified drugs, medicaments and biological substances, initial encounter: Secondary | ICD-10-CM | POA: Diagnosis not present

## 2020-05-27 DIAGNOSIS — Z87891 Personal history of nicotine dependence: Secondary | ICD-10-CM | POA: Insufficient documentation

## 2020-05-27 DIAGNOSIS — D72829 Elevated white blood cell count, unspecified: Secondary | ICD-10-CM | POA: Diagnosis not present

## 2020-05-27 DIAGNOSIS — M545 Low back pain, unspecified: Secondary | ICD-10-CM | POA: Diagnosis not present

## 2020-05-27 DIAGNOSIS — N183 Chronic kidney disease, stage 3 unspecified: Secondary | ICD-10-CM | POA: Insufficient documentation

## 2020-05-27 DIAGNOSIS — J90 Pleural effusion, not elsewhere classified: Secondary | ICD-10-CM | POA: Diagnosis not present

## 2020-05-27 DIAGNOSIS — Z8673 Personal history of transient ischemic attack (TIA), and cerebral infarction without residual deficits: Secondary | ICD-10-CM | POA: Insufficient documentation

## 2020-05-27 DIAGNOSIS — I517 Cardiomegaly: Secondary | ICD-10-CM | POA: Diagnosis not present

## 2020-05-27 DIAGNOSIS — Z86010 Personal history of colonic polyps: Secondary | ICD-10-CM | POA: Insufficient documentation

## 2020-05-27 DIAGNOSIS — R531 Weakness: Secondary | ICD-10-CM | POA: Diagnosis not present

## 2020-05-27 DIAGNOSIS — I129 Hypertensive chronic kidney disease with stage 1 through stage 4 chronic kidney disease, or unspecified chronic kidney disease: Secondary | ICD-10-CM | POA: Insufficient documentation

## 2020-05-27 DIAGNOSIS — G928 Other toxic encephalopathy: Secondary | ICD-10-CM | POA: Diagnosis not present

## 2020-05-27 DIAGNOSIS — I10 Essential (primary) hypertension: Secondary | ICD-10-CM | POA: Diagnosis not present

## 2020-05-27 DIAGNOSIS — Z96641 Presence of right artificial hip joint: Secondary | ICD-10-CM | POA: Insufficient documentation

## 2020-05-27 DIAGNOSIS — J449 Chronic obstructive pulmonary disease, unspecified: Secondary | ICD-10-CM | POA: Insufficient documentation

## 2020-05-27 DIAGNOSIS — G319 Degenerative disease of nervous system, unspecified: Secondary | ICD-10-CM | POA: Diagnosis not present

## 2020-05-27 DIAGNOSIS — I480 Paroxysmal atrial fibrillation: Secondary | ICD-10-CM | POA: Insufficient documentation

## 2020-05-27 DIAGNOSIS — Z20822 Contact with and (suspected) exposure to covid-19: Secondary | ICD-10-CM | POA: Diagnosis not present

## 2020-05-27 DIAGNOSIS — Z79899 Other long term (current) drug therapy: Secondary | ICD-10-CM | POA: Insufficient documentation

## 2020-05-27 DIAGNOSIS — R410 Disorientation, unspecified: Secondary | ICD-10-CM | POA: Diagnosis not present

## 2020-05-27 LAB — COMPREHENSIVE METABOLIC PANEL
ALT: 10 U/L (ref 0–44)
AST: 16 U/L (ref 15–41)
Albumin: 4.5 g/dL (ref 3.5–5.0)
Alkaline Phosphatase: 89 U/L (ref 38–126)
Anion gap: 10 (ref 5–15)
BUN: 17 mg/dL (ref 8–23)
CO2: 31 mmol/L (ref 22–32)
Calcium: 9.9 mg/dL (ref 8.9–10.3)
Chloride: 98 mmol/L (ref 98–111)
Creatinine, Ser: 0.77 mg/dL (ref 0.44–1.00)
GFR calc non Af Amer: >60 mL/min (ref >60)
Glucose, Bld: 124 mg/dL — ABNORMAL HIGH (ref 70–99)
Potassium: 3.9 mmol/L (ref 3.5–5.1)
Sodium: 139 mmol/L (ref 135–145)
Total Bilirubin: 1.1 mg/dL (ref 0.3–1.2)
Total Protein: 7.9 g/dL (ref 6.5–8.1)

## 2020-05-27 LAB — URINALYSIS, ROUTINE W REFLEX MICROSCOPIC
Bacteria, UA: NONE SEEN
Bilirubin Urine: NEGATIVE
Glucose, UA: NEGATIVE mg/dL
Hgb urine dipstick: NEGATIVE
Ketones, ur: NEGATIVE mg/dL
Nitrite: NEGATIVE
Protein, ur: NEGATIVE mg/dL
Specific Gravity, Urine: 1.008 (ref 1.005–1.030)
pH: 6 (ref 5.0–8.0)

## 2020-05-27 LAB — CBC WITH DIFFERENTIAL/PLATELET
Abs Immature Granulocytes: 0.07 10*3/uL (ref 0.00–0.07)
Basophils Absolute: 0 10*3/uL (ref 0.0–0.1)
Basophils Relative: 0 %
Eosinophils Absolute: 0 10*3/uL (ref 0.0–0.5)
Eosinophils Relative: 0 %
HCT: 39.1 % (ref 36.0–46.0)
Hemoglobin: 12.5 g/dL (ref 12.0–15.0)
Immature Granulocytes: 1 %
Lymphocytes Relative: 14 %
Lymphs Abs: 0.8 10*3/uL (ref 0.7–4.0)
MCH: 30.2 pg (ref 26.0–34.0)
MCHC: 32 g/dL (ref 30.0–36.0)
MCV: 94.4 fL (ref 80.0–100.0)
Monocytes Absolute: 0.4 10*3/uL (ref 0.1–1.0)
Monocytes Relative: 7 %
Neutro Abs: 4.2 10*3/uL (ref 1.7–7.7)
Neutrophils Relative %: 78 %
Platelets: 251 10*3/uL (ref 150–400)
RBC: 4.14 MIL/uL (ref 3.87–5.11)
RDW: 12.8 % (ref 11.5–15.5)
WBC: 5.4 10*3/uL (ref 4.0–10.5)
nRBC: 0 % (ref 0.0–0.2)

## 2020-05-27 NOTE — ED Provider Notes (Signed)
  Physical Exam  BP (!) 153/102 (BP Location: Right Arm)   Pulse 70   Temp 98.6 F (37 C) (Oral)   Resp 16   SpO2 96%   Physical Exam  ED Course/Procedures     Procedures  MDM  Patient transferred from [facility] for [facility]. Her Eliquis was stopped for steroid injection today. Back to baseline on arrival to ED. MRI pending at transfer   9:30 PM MRI negative. Reviewed labs and UA and CXR which were all negative. Confusion resolved. Updated son in law, Dr. Cyndia Bent. Stable for discharge       Drenda Freeze, MD 05/27/20 2131

## 2020-05-27 NOTE — ED Triage Notes (Signed)
Pt presents with c/o altered mental status. Pt and family member report that she has been confused since yesterday. Pt got a steroid/lidocaine injection in her spine yesterday. Pt is able to answer all questions, no neuro deficits noted at this time.
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
## 2020-05-27 NOTE — ED Notes (Signed)
Pt arrives to [facility] for MRI, no complaints, accompanied by husband

## 2020-05-27 NOTE — ED Notes (Signed)
Report given to Delia Chimes Hydrographic surveyor) at [employer]. Per MD pt will have peripheral IV still in while getting transferred to [facility] via personal vehicle with husband.

## 2020-05-27 NOTE — Discharge Instructions (Addendum)
Your MRI today showed no stroke and your labs and urinalysis were unremarkable.  You likely had a reaction after steroid shot   Please take your blood thinner as prescribed   See your doctor   Return to ER if you have lethargy, weakness, trouble speaking

## 2020-05-28 ENCOUNTER — Encounter (HOSPITAL_COMMUNITY): Payer: Self-pay | Admitting: Emergency Medicine

## 2020-05-28 ENCOUNTER — Inpatient Hospital Stay (HOSPITAL_COMMUNITY)
Admission: EM | Admit: 2020-05-28 | Discharge: 2020-05-31 | DRG: 092 | Disposition: A | Discharge status: Home / Self Care | Payer: Medicare Other | Attending: Internal Medicine | Admitting: Internal Medicine

## 2020-05-28 ENCOUNTER — Telehealth: Payer: Self-pay | Admitting: Cardiology

## 2020-05-28 ENCOUNTER — Emergency Department (HOSPITAL_COMMUNITY): Payer: Medicare Other

## 2020-05-28 DIAGNOSIS — E876 Hypokalemia: Secondary | ICD-10-CM | POA: Diagnosis present

## 2020-05-28 DIAGNOSIS — T380X5A Adverse effect of glucocorticoids and synthetic analogues, initial encounter: Secondary | ICD-10-CM | POA: Diagnosis present

## 2020-05-28 DIAGNOSIS — R410 Disorientation, unspecified: Secondary | ICD-10-CM

## 2020-05-28 DIAGNOSIS — Z79890 Hormone replacement therapy (postmenopausal): Secondary | ICD-10-CM

## 2020-05-28 DIAGNOSIS — J449 Chronic obstructive pulmonary disease, unspecified: Secondary | ICD-10-CM | POA: Diagnosis present

## 2020-05-28 DIAGNOSIS — M545 Low back pain, unspecified: Secondary | ICD-10-CM | POA: Diagnosis not present

## 2020-05-28 DIAGNOSIS — T50915A Adverse effect of multiple unspecified drugs, medicaments and biological substances, initial encounter: Secondary | ICD-10-CM | POA: Diagnosis present

## 2020-05-28 DIAGNOSIS — D72829 Elevated white blood cell count, unspecified: Secondary | ICD-10-CM | POA: Diagnosis present

## 2020-05-28 DIAGNOSIS — Z8249 Family history of ischemic heart disease and other diseases of the circulatory system: Secondary | ICD-10-CM

## 2020-05-28 DIAGNOSIS — K219 Gastro-esophageal reflux disease without esophagitis: Secondary | ICD-10-CM | POA: Diagnosis present

## 2020-05-28 DIAGNOSIS — I10 Essential (primary) hypertension: Secondary | ICD-10-CM | POA: Diagnosis present

## 2020-05-28 DIAGNOSIS — F32A Depression, unspecified: Secondary | ICD-10-CM | POA: Diagnosis present

## 2020-05-28 DIAGNOSIS — G928 Other toxic encephalopathy: Principal | ICD-10-CM | POA: Diagnosis present

## 2020-05-28 DIAGNOSIS — Z79899 Other long term (current) drug therapy: Secondary | ICD-10-CM

## 2020-05-28 DIAGNOSIS — G9340 Encephalopathy, unspecified: Secondary | ICD-10-CM | POA: Diagnosis present

## 2020-05-28 DIAGNOSIS — R531 Weakness: Secondary | ICD-10-CM | POA: Diagnosis not present

## 2020-05-28 DIAGNOSIS — E039 Hypothyroidism, unspecified: Secondary | ICD-10-CM | POA: Diagnosis present

## 2020-05-28 DIAGNOSIS — Z20822 Contact with and (suspected) exposure to covid-19: Secondary | ICD-10-CM | POA: Diagnosis present

## 2020-05-28 DIAGNOSIS — Z8673 Personal history of transient ischemic attack (TIA), and cerebral infarction without residual deficits: Secondary | ICD-10-CM

## 2020-05-28 DIAGNOSIS — Z905 Acquired absence of kidney: Secondary | ICD-10-CM

## 2020-05-28 DIAGNOSIS — Z7901 Long term (current) use of anticoagulants: Secondary | ICD-10-CM

## 2020-05-28 DIAGNOSIS — G8929 Other chronic pain: Secondary | ICD-10-CM | POA: Diagnosis present

## 2020-05-28 DIAGNOSIS — I4819 Other persistent atrial fibrillation: Secondary | ICD-10-CM | POA: Diagnosis present

## 2020-05-28 DIAGNOSIS — Z79891 Long term (current) use of opiate analgesic: Secondary | ICD-10-CM

## 2020-05-28 DIAGNOSIS — Z981 Arthrodesis status: Secondary | ICD-10-CM

## 2020-05-28 LAB — CBC WITH DIFFERENTIAL/PLATELET
Abs Immature Granulocytes: 0.02 10*3/uL (ref 0.00–0.07)
Basophils Absolute: 0 10*3/uL (ref 0.0–0.1)
Basophils Relative: 0 %
Eosinophils Absolute: 0 10*3/uL (ref 0.0–0.5)
Eosinophils Relative: 0 %
HCT: 43.1 % (ref 36.0–46.0)
Hemoglobin: 14 g/dL (ref 12.0–15.0)
Immature Granulocytes: 0 %
Lymphocytes Relative: 15 %
Lymphs Abs: 1.1 10*3/uL (ref 0.7–4.0)
MCH: 30.6 pg (ref 26.0–34.0)
MCHC: 32.5 g/dL (ref 30.0–36.0)
MCV: 94.1 fL (ref 80.0–100.0)
Monocytes Absolute: 0.5 10*3/uL (ref 0.1–1.0)
Monocytes Relative: 7 %
Neutro Abs: 5.8 10*3/uL (ref 1.7–7.7)
Neutrophils Relative %: 78 %
Platelets: 298 10*3/uL (ref 150–400)
RBC: 4.58 MIL/uL (ref 3.87–5.11)
RDW: 12.9 % (ref 11.5–15.5)
WBC: 7.5 10*3/uL (ref 4.0–10.5)
nRBC: 0 % (ref 0.0–0.2)

## 2020-05-28 LAB — URINALYSIS, ROUTINE W REFLEX MICROSCOPIC
Bacteria, UA: NONE SEEN
Bilirubin Urine: NEGATIVE
Glucose, UA: NEGATIVE mg/dL
Ketones, ur: NEGATIVE mg/dL
Nitrite: NEGATIVE
Protein, ur: NEGATIVE mg/dL
Specific Gravity, Urine: 1.008 (ref 1.005–1.030)
pH: 8 (ref 5.0–8.0)

## 2020-05-28 LAB — COMPREHENSIVE METABOLIC PANEL
ALT: 9 U/L (ref 0–44)
AST: 17 U/L (ref 15–41)
Albumin: 4.5 g/dL (ref 3.5–5.0)
Alkaline Phosphatase: 88 U/L (ref 38–126)
Anion gap: 14 (ref 5–15)
BUN: 18 mg/dL (ref 8–23)
CO2: 28 mmol/L (ref 22–32)
Calcium: 10 mg/dL (ref 8.9–10.3)
Chloride: 100 mmol/L (ref 98–111)
Creatinine, Ser: 0.74 mg/dL (ref 0.44–1.00)
GFR, Estimated: >60 mL/min (ref >60)
Glucose, Bld: 100 mg/dL — ABNORMAL HIGH (ref 70–99)
Potassium: 3.5 mmol/L (ref 3.5–5.1)
Sodium: 142 mmol/L (ref 135–145)
Total Bilirubin: 1.3 mg/dL — ABNORMAL HIGH (ref 0.3–1.2)
Total Protein: 7.7 g/dL (ref 6.5–8.1)

## 2020-05-28 LAB — LACTIC ACID, PLASMA
Lactic Acid, Venous: 1.1 mmol/L (ref 0.5–1.9)
Lactic Acid, Venous: 1.4 mmol/L (ref 0.5–1.9)

## 2020-05-28 LAB — RESPIRATORY PANEL BY RT PCR (FLU A&B, COVID)
Influenza A by PCR: NEGATIVE
Influenza B by PCR: NEGATIVE
SARS Coronavirus 2 by RT PCR: NEGATIVE

## 2020-05-28 LAB — AMMONIA: Ammonia: 17 umol/L (ref 9–35)

## 2020-05-28 MED ORDER — BISOPROLOL FUMARATE 5 MG PO TABS: 2.5000 mg | ORAL_TABLET | Freq: Every day | ORAL | Status: DC

## 2020-05-28 MED ORDER — SPIRONOLACTONE 25 MG PO TABS: 25.0000 mg | ORAL_TABLET | Freq: Every day | ORAL | Status: DC

## 2020-05-28 MED ORDER — FENTANYL 50 MCG/HR TD PT72
1.0000 | MEDICATED_PATCH | TRANSDERMAL | Status: DC
  Administered 2020-05-29: 1 via TRANSDERMAL
  Filled 2020-05-28: qty 1

## 2020-05-28 MED ORDER — SODIUM CHLORIDE 0.9 % IV BOLUS
500.0000 mL | Freq: Once | INTRAVENOUS | Status: AC
  Administered 2020-05-28: 500 mL via INTRAVENOUS

## 2020-05-28 MED ORDER — MORPHINE SULFATE (PF) 2 MG/ML IV SOLN
1.0000 mg | INTRAVENOUS | Status: DC | PRN
  Administered 2020-05-29: 1 mg via INTRAVENOUS
  Filled 2020-05-28 (×3): qty 1

## 2020-05-28 MED ORDER — ATORVASTATIN CALCIUM 10 MG PO TABS: 10.0000 mg | ORAL_TABLET | Freq: Every day | ORAL | Status: DC

## 2020-05-28 MED ORDER — FENTANYL CITRATE (PF) 100 MCG/2ML IJ SOLN: 50.0000 ug | Freq: Once | INTRAMUSCULAR | Status: DC

## 2020-05-28 MED ORDER — IOHEXOL 300 MG/ML  SOLN
100.0000 mL | Freq: Once | INTRAMUSCULAR | Status: AC | PRN
  Administered 2020-05-28: 100 mL via INTRAVENOUS

## 2020-05-28 MED ORDER — CIPROFLOXACIN IN D5W 400 MG/200ML IV SOLN: 400.0000 mg | Freq: Two times a day (BID) | INTRAVENOUS | Status: DC

## 2020-05-28 MED ORDER — DILTIAZEM LOAD VIA INFUSION: 10.0000 mg | Freq: Once | INTRAVENOUS | Status: DC

## 2020-05-28 MED ORDER — SUCRALFATE 1 G PO TABS: 1.0000 g | ORAL_TABLET | Freq: Three times a day (TID) | ORAL | Status: DC

## 2020-05-28 MED ORDER — FLUOXETINE HCL 20 MG PO CAPS: 20.0000 mg | ORAL_CAPSULE | Freq: Every day | ORAL | Status: DC

## 2020-05-28 MED ORDER — POLYETHYLENE GLYCOL 3350 17 G PO PACK: 8.0000 g | PACK | Freq: Every day | ORAL | Status: DC

## 2020-05-28 MED ORDER — DILTIAZEM HCL 25 MG/5ML IV SOLN
10.0000 mg | Freq: Once | INTRAVENOUS | Status: AC
  Administered 2020-05-29: 10 mg via INTRAVENOUS
  Filled 2020-05-28: qty 5

## 2020-05-28 MED ORDER — MORPHINE SULFATE (PF) 2 MG/ML IV SOLN
2.0000 mg | Freq: Once | INTRAVENOUS | Status: AC
  Administered 2020-05-28: 2 mg via INTRAVENOUS
  Filled 2020-05-28: qty 1

## 2020-05-28 MED ORDER — PANTOPRAZOLE SODIUM 40 MG PO TBEC: 40.0000 mg | DELAYED_RELEASE_TABLET | Freq: Two times a day (BID) | ORAL | Status: DC

## 2020-05-28 MED ORDER — ONDANSETRON HCL 4 MG/2ML IJ SOLN
4.0000 mg | Freq: Once | INTRAMUSCULAR | Status: AC
  Administered 2020-05-29: 4 mg via INTRAVENOUS
  Filled 2020-05-28: qty 2

## 2020-05-28 MED ORDER — LEVOTHYROXINE SODIUM 88 MCG PO TABS: 88.0000 ug | ORAL_TABLET | Freq: Every day | ORAL | Status: DC

## 2020-05-28 MED ORDER — SODIUM CHLORIDE 0.9 % IV SOLN
INTRAVENOUS | Status: DC | PRN
  Administered 2020-05-28: 250 mL via INTRAVENOUS

## 2020-05-28 MED ORDER — SODIUM CHLORIDE 0.9 % IV SOLN
1.0000 g | Freq: Every day | INTRAVENOUS | Status: DC
  Administered 2020-05-29: 1 g via INTRAVENOUS
  Filled 2020-05-28: qty 1
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
## 2020-05-28 NOTE — Progress Notes (Signed)
RT unable to obtaun ABG. Patient refuses any other stick RN aware.

## 2020-05-28 NOTE — ED Triage Notes (Signed)
Per pt/husband-states she  Was here yesterday for the same symptoms-received pain injection in back on Tuesday and has not been the same since-states decreased appetite, increased pain

## 2020-05-28 NOTE — Telephone Encounter (Addendum)
Spoke with patients Daughter [name] (okay per DPR) who states patient is out of eliquis. Patients daughter states that patient was seen yesterday in the ED for stroke like symptoms, and was cleared by MRI and CT and can continue taking eliquis. Patients daughter states that she was checking her mothers medication when back home and noticed patient was almost out of elquis. Patients daughter states that per patient and patients husband the eliquis is too expensive with their insurance. Advised patients daughter that 2 boxes of eliquis will be left at the front desk for pick up along with patient assistance forms for patient to fill out. Patients daughter verbalized understanding.   Medication Samples have been provided to the patient.  Drug name: Eliquis       Strength: 5mg        Qty: 2 Boxes (28 tablets)  LOT: [id number]  Exp.Date: 7/23  Dosing instructions: Take 5mg  (1tablet) by mouth twice daily  The patient has been instructed regarding the correct time, dose, and frequency of taking this medication, including desired effects and most common side effects.   [name] 11:10 AM 05/28/2020 \

## 2020-05-28 NOTE — ED Notes (Signed)
RT at bedside attempting ABG

## 2020-05-28 NOTE — Progress Notes (Signed)
Patient is in Afib RVR. HR ranging 100-128. Patient will not  travel to [facility] for stat MRI at this time. Attending MD- Hal Hope gave orders to hold the procedure secondary to cardiac rhythm. Will reassess at later time after cardiac rhythm stabilizes.  Orders given . Will continue to monitor.

## 2020-05-28 NOTE — Telephone Encounter (Signed)
Patient calling the office for samples of medication: ° ° °1.  What medication and dosage are you requesting samples for? ° °apixaban (ELIQUIS) 5 MG TABS tablet ° °2.  Are you currently out of this medication?  yes ° ° °

## 2020-05-28 NOTE — Progress Notes (Signed)
Patient has orders for MRI to be completed at [facility]- Stat. Attending MD gave order to hold telemetry during this time. Will remove tele for transfer and procedure. Will restart telemetry monitoring upon return to [facility].
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
## 2020-05-28 NOTE — ED Notes (Signed)
Report called to Ubaldo Glassing, RN on 4E

## 2020-05-29 ENCOUNTER — Other Ambulatory Visit: Payer: Self-pay

## 2020-05-29 DIAGNOSIS — G928 Other toxic encephalopathy: Secondary | ICD-10-CM | POA: Diagnosis present

## 2020-05-29 DIAGNOSIS — M545 Low back pain, unspecified: Secondary | ICD-10-CM | POA: Diagnosis not present

## 2020-05-29 DIAGNOSIS — Z8249 Family history of ischemic heart disease and other diseases of the circulatory system: Secondary | ICD-10-CM | POA: Diagnosis not present

## 2020-05-29 DIAGNOSIS — J449 Chronic obstructive pulmonary disease, unspecified: Secondary | ICD-10-CM | POA: Diagnosis present

## 2020-05-29 DIAGNOSIS — T50915A Adverse effect of multiple unspecified drugs, medicaments and biological substances, initial encounter: Secondary | ICD-10-CM | POA: Diagnosis present

## 2020-05-29 DIAGNOSIS — I4819 Other persistent atrial fibrillation: Secondary | ICD-10-CM | POA: Diagnosis present

## 2020-05-29 DIAGNOSIS — R410 Disorientation, unspecified: Secondary | ICD-10-CM | POA: Diagnosis not present

## 2020-05-29 DIAGNOSIS — Z79890 Hormone replacement therapy (postmenopausal): Secondary | ICD-10-CM | POA: Diagnosis not present

## 2020-05-29 DIAGNOSIS — T380X5A Adverse effect of glucocorticoids and synthetic analogues, initial encounter: Secondary | ICD-10-CM | POA: Diagnosis present

## 2020-05-29 DIAGNOSIS — Z79891 Long term (current) use of opiate analgesic: Secondary | ICD-10-CM | POA: Diagnosis not present

## 2020-05-29 DIAGNOSIS — Z79899 Other long term (current) drug therapy: Secondary | ICD-10-CM | POA: Diagnosis not present

## 2020-05-29 DIAGNOSIS — K219 Gastro-esophageal reflux disease without esophagitis: Secondary | ICD-10-CM | POA: Diagnosis present

## 2020-05-29 DIAGNOSIS — Z981 Arthrodesis status: Secondary | ICD-10-CM | POA: Diagnosis not present

## 2020-05-29 DIAGNOSIS — F32A Depression, unspecified: Secondary | ICD-10-CM | POA: Diagnosis present

## 2020-05-29 DIAGNOSIS — D72829 Elevated white blood cell count, unspecified: Secondary | ICD-10-CM | POA: Diagnosis present

## 2020-05-29 DIAGNOSIS — Z905 Acquired absence of kidney: Secondary | ICD-10-CM | POA: Diagnosis not present

## 2020-05-29 DIAGNOSIS — E039 Hypothyroidism, unspecified: Secondary | ICD-10-CM | POA: Diagnosis present

## 2020-05-29 DIAGNOSIS — G8929 Other chronic pain: Secondary | ICD-10-CM | POA: Diagnosis present

## 2020-05-29 DIAGNOSIS — E876 Hypokalemia: Secondary | ICD-10-CM | POA: Diagnosis present

## 2020-05-29 DIAGNOSIS — Z20822 Contact with and (suspected) exposure to covid-19: Secondary | ICD-10-CM | POA: Diagnosis present

## 2020-05-29 DIAGNOSIS — I10 Essential (primary) hypertension: Secondary | ICD-10-CM | POA: Diagnosis present

## 2020-05-29 DIAGNOSIS — Z7901 Long term (current) use of anticoagulants: Secondary | ICD-10-CM | POA: Diagnosis not present

## 2020-05-29 DIAGNOSIS — Z8673 Personal history of transient ischemic attack (TIA), and cerebral infarction without residual deficits: Secondary | ICD-10-CM | POA: Diagnosis not present

## 2020-05-29 LAB — COMPREHENSIVE METABOLIC PANEL
ALT: 10 U/L (ref 0–44)
AST: 17 U/L (ref 15–41)
Albumin: 4.6 g/dL (ref 3.5–5.0)
Alkaline Phosphatase: 89 U/L (ref 38–126)
Anion gap: 20 — ABNORMAL HIGH (ref 5–15)
BUN: 16 mg/dL (ref 8–23)
CO2: 25 mmol/L (ref 22–32)
Calcium: 10.5 mg/dL — ABNORMAL HIGH (ref 8.9–10.3)
Chloride: 98 mmol/L (ref 98–111)
Creatinine, Ser: 0.68 mg/dL (ref 0.44–1.00)
GFR, Estimated: >60 mL/min (ref >60)
Glucose, Bld: 130 mg/dL — ABNORMAL HIGH (ref 70–99)
Potassium: 3.1 mmol/L — ABNORMAL LOW (ref 3.5–5.1)
Sodium: 143 mmol/L (ref 135–145)
Total Bilirubin: 1.4 mg/dL — ABNORMAL HIGH (ref 0.3–1.2)
Total Protein: 7.8 g/dL (ref 6.5–8.1)

## 2020-05-29 LAB — CBC WITH DIFFERENTIAL/PLATELET
Abs Immature Granulocytes: 0.07 10*3/uL (ref 0.00–0.07)
Basophils Absolute: 0 10*3/uL (ref 0.0–0.1)
Basophils Relative: 0 %
Eosinophils Absolute: 0 10*3/uL (ref 0.0–0.5)
Eosinophils Relative: 0 %
HCT: 42.9 % (ref 36.0–46.0)
Hemoglobin: 14.1 g/dL (ref 12.0–15.0)
Immature Granulocytes: 1 %
Lymphocytes Relative: 9 %
Lymphs Abs: 1 10*3/uL (ref 0.7–4.0)
MCH: 30.3 pg (ref 26.0–34.0)
MCHC: 32.9 g/dL (ref 30.0–36.0)
MCV: 92.3 fL (ref 80.0–100.0)
Monocytes Absolute: 0.8 10*3/uL (ref 0.1–1.0)
Monocytes Relative: 7 %
Neutro Abs: 10 10*3/uL — ABNORMAL HIGH (ref 1.7–7.7)
Neutrophils Relative %: 83 %
Platelets: 322 10*3/uL (ref 150–400)
RBC: 4.65 MIL/uL (ref 3.87–5.11)
RDW: 12.8 % (ref 11.5–15.5)
WBC: 11.9 10*3/uL — ABNORMAL HIGH (ref 4.0–10.5)
nRBC: 0 % (ref 0.0–0.2)

## 2020-05-29 LAB — TROPONIN I (HIGH SENSITIVITY): Troponin I (High Sensitivity): 5 ng/L (ref <18)

## 2020-05-29 LAB — TSH: TSH: 3.552 u[IU]/mL (ref 0.350–4.500)

## 2020-05-29 MED ORDER — PANTOPRAZOLE SODIUM 40 MG IV SOLR
40.0000 mg | INTRAVENOUS | Status: DC
  Administered 2020-05-30: 40 mg via INTRAVENOUS
  Filled 2020-05-29 (×2): qty 40

## 2020-05-29 MED ORDER — APIXABAN 5 MG PO TABS
5.0000 mg | ORAL_TABLET | Freq: Two times a day (BID) | ORAL | Status: DC
  Administered 2020-05-29 – 2020-05-31 (×5): 5 mg via ORAL
  Filled 2020-05-29 (×5): qty 1

## 2020-05-29 MED ORDER — FLUOXETINE HCL 20 MG PO CAPS
20.0000 mg | ORAL_CAPSULE | Freq: Every day | ORAL | Status: DC
  Administered 2020-05-29 – 2020-05-30 (×2): 20 mg via ORAL
  Filled 2020-05-29 (×2): qty 1

## 2020-05-29 MED ORDER — QUETIAPINE FUMARATE 25 MG PO TABS
25.0000 mg | ORAL_TABLET | Freq: Every day | ORAL | Status: DC
  Administered 2020-05-29 – 2020-05-30 (×2): 25 mg via ORAL
  Filled 2020-05-29 (×2): qty 1

## 2020-05-29 MED ORDER — LORAZEPAM 2 MG/ML IJ SOLN
0.2500 mg | Freq: Once | INTRAMUSCULAR | Status: AC
  Administered 2020-05-29: 0.25 mg via INTRAVENOUS
  Filled 2020-05-29: qty 1

## 2020-05-29 MED ORDER — ATORVASTATIN CALCIUM 10 MG PO TABS
10.0000 mg | ORAL_TABLET | Freq: Every day | ORAL | Status: DC
  Administered 2020-05-29 – 2020-05-31 (×3): 10 mg via ORAL
  Filled 2020-05-29 (×3): qty 1

## 2020-05-29 MED ORDER — GABAPENTIN 100 MG PO CAPS
100.0000 mg | ORAL_CAPSULE | Freq: Three times a day (TID) | ORAL | Status: DC
  Administered 2020-05-29 (×2): 100 mg via ORAL
  Filled 2020-05-29 (×2): qty 1

## 2020-05-29 MED ORDER — ENSURE ENLIVE PO LIQD: 237.0000 mL | Freq: Two times a day (BID) | ORAL | Status: DC

## 2020-05-29 MED ORDER — BISOPROLOL FUMARATE 5 MG PO TABS
2.5000 mg | ORAL_TABLET | Freq: Every day | ORAL | Status: DC
  Administered 2020-05-29 – 2020-05-31 (×3): 2.5 mg via ORAL
  Filled 2020-05-29 (×3): qty 1

## 2020-05-29 MED ORDER — LEVOTHYROXINE SODIUM 88 MCG PO TABS
88.0000 ug | ORAL_TABLET | Freq: Every day | ORAL | Status: DC
  Administered 2020-05-30 – 2020-05-31 (×2): 88 ug via ORAL
  Filled 2020-05-29 (×2): qty 1

## 2020-05-29 MED ORDER — HALOPERIDOL LACTATE 5 MG/ML IJ SOLN: 1.0000 mg | Freq: Four times a day (QID) | INTRAMUSCULAR | Status: DC | PRN

## 2020-05-29 MED ORDER — SPIRONOLACTONE 25 MG PO TABS
25.0000 mg | ORAL_TABLET | Freq: Every day | ORAL | Status: DC
  Administered 2020-05-29 – 2020-05-31 (×3): 25 mg via ORAL
  Filled 2020-05-29 (×3): qty 1

## 2020-05-29 MED ORDER — HYDRALAZINE HCL 20 MG/ML IJ SOLN
10.0000 mg | INTRAMUSCULAR | Status: DC | PRN
  Administered 2020-05-29: 10 mg via INTRAVENOUS
  Filled 2020-05-29: qty 1

## 2020-05-29 MED ORDER — KCL IN DEXTROSE-NACL 40-5-0.45 MEQ/L-%-% IV SOLN
INTRAVENOUS | Status: DC
  Filled 2020-05-29 (×3): qty 1000

## 2020-05-29 MED ORDER — DEXTROSE-NACL 5-0.9 % IV SOLN: INTRAVENOUS | Status: DC

## 2020-05-29 MED ORDER — ADULT MULTIVITAMIN W/MINERALS CH
1.0000 | ORAL_TABLET | Freq: Every day | ORAL | Status: DC
  Administered 2020-05-29 – 2020-05-31 (×3): 1 via ORAL
  Filled 2020-05-29 (×3): qty 1

## 2020-05-29 MED ORDER — METOPROLOL TARTRATE 5 MG/5ML IV SOLN
2.5000 mg | Freq: Four times a day (QID) | INTRAVENOUS | Status: DC
  Administered 2020-05-29 (×2): 2.5 mg via INTRAVENOUS
  Filled 2020-05-29 (×2): qty 5

## 2020-05-29 MED ORDER — LEVOTHYROXINE SODIUM 100 MCG/5ML IV SOLN
44.0000 ug | Freq: Every day | INTRAVENOUS | Status: DC
  Administered 2020-05-29: 44 ug via INTRAVENOUS
  Filled 2020-05-29: qty 5

## 2020-05-29 NOTE — Progress Notes (Signed)
Pt spouse was approved to stay over night with patient. Patient very confused and pulling out tubes/ drains.  Spouse has been informed once patient is back at baseline, he will be unable to stay outside of visiting hours.

## 2020-05-29 NOTE — Progress Notes (Signed)
PROGRESS NOTE    [name]  VHQ:[id number] DOB: 1946/07/25 DOA: 05/28/2020 PCP: Prince Solian, MD     Brief Narrative:  [name] is a 74 y.o. female with history of atrial fibrillation, CVA, COPD, right-sided nephrectomy, hypothyroidism, cardiomyopathy with improved EF has been experiencing worsening low back pain since patient received epidural injection on Tuesday on May 25, 2020 notes around 3 days ago.  The following day patient did come to the ER due to confusion and worsening low back pain.  Since patient was off Eliquis for the injection, MRI of the brain was done which did not show anything acute.  Patient was discharged home.  As per the husband patient's pain has been worsening and has become more confused. CT lumbar spine does not show any fluid collection.  After admission patient did have an episode of A. fib with RVR for which she received 1 dose of Cardizem 10 mg IV. Patient admitted for acute encephalopathy with worsening low back pain.   New events last 24 hours / Subjective: Husband is at bedside and provides history.  Patient did not slept much all night, has been confused, repeating herself  Assessment & Plan:   Principal Problem:   Low back pain Active Problems:   HTN (hypertension)   Persistent atrial fibrillation   Acute encephalopathy   Hypothyroidism   Acute metabolic encephalopathy -Could be secondary to polypharmacy as patient is on many mentation altering medications, could be a pain response, question if this is related to her steroid injection -Discussed over the phone with Dr. Cheral Marker of neurology, he recommended stopping many of her home medications as this could contribute to her encephalopathy, delirium precautions as well with frequent reorientation  Severe low back pain -Status post epidural steroid injection on 10/6 -CT lumbar spine without fluid collection -MRI lumbar spine pending -PT OT  Leukocytosis -UA does not suggest  UTI.  Urine culture and blood cultures are pending at this time -Hold off on further antibiotics until source has been found  A. fib RVR -Patient received 1 dose of IV Cardizem -Resume bisoprolol -Resume Eliquis  Hypertension -Continue spironolactone  Hypothyroidism -TSH normal 3.552 -Continue Synthroid  Depression -Continue Prozac -Hold Wellbutrin  Hypokalemia -Replace, trend     DVT prophylaxis:  SCDs Start: 05/28/20 2107 apixaban (ELIQUIS) tablet 5 mg  Code Status: Full code Family Communication: Spoke with husband at bedside, called daughter over the phone for an update Disposition Plan:  Status is: Inpatient  Remains inpatient appropriate because:Altered mental status   Dispo: The patient is from: Home              Anticipated d/c is to: Home              Anticipated d/c date is: 2 days              Patient currently is not medically stable to d/c.  Remains confused this morning      Consultants:   Neurology over the phone  Procedures:   None  Antimicrobials:  Anti-infectives (From admission, onward)   Start     Dose/Rate Route Frequency Ordered Stop   05/28/20 2200  ciprofloxacin (CIPRO) IVPB 400 mg  Status:  Discontinued        400 mg 200 mL/hr over 60 Minutes Intravenous Every 12 hours 05/28/20 2116 05/28/20 2121   05/28/20 2200  cefTRIAXone (ROCEPHIN) 1 g in sodium chloride 0.9 % 100 mL IVPB  Status:  Discontinued  1 g 200 mL/hr over 30 Minutes Intravenous Daily 05/28/20 2121 05/29/20 0801        Objective: Vitals:   05/28/20 2043 05/28/20 2117 05/29/20 0050 05/29/20 0508  BP: (!) 159/89  (!) 151/103 140/86  Pulse: 93  99 98  Resp:   (!) 22 20  Temp: 97.7 F (36.5 C)  97.8 F (36.6 C) 98 F (36.7 C)  TempSrc: Oral  Oral Axillary  SpO2: 98%  98% 96%  Weight:  79.5 kg      Intake/Output Summary (Last 24 hours) at 05/29/2020 1228 Last data filed at 05/29/2020 0400 Gross per 24 hour  Intake 500 ml  Output 400 ml  Net 100  ml   Filed Weights   05/28/20 2117  Weight: 79.5 kg    Examination:  General exam: Appears calm, resting comfortably Respiratory system: Clear to auscultation. Respiratory effort normal. No respiratory distress.  Cardiovascular system: S1 & S2 heard, irregular rhythm, rate 90s.  No pedal edema. Gastrointestinal system: Abdomen is nondistended, soft and nontender. Normal bowel sounds heard. Extremities: Symmetric in appearance  Skin: No rashes, lesions or ulcers on exposed skin    Data Reviewed: I have personally reviewed following labs and imaging studies  CBC: Recent Labs  Lab 05/27/20 1505 05/28/20 1711 05/29/20 0601  WBC 5.4 7.5 11.9*  NEUTROABS 4.2 5.8 10.0*  HGB 12.5 14.0 14.1  HCT 39.1 43.1 42.9  MCV 94.4 94.1 92.3  PLT 251 298 425   Basic Metabolic Panel: Recent Labs  Lab 05/27/20 1505 05/28/20 1711 05/29/20 0601  NA 139 142 143  K 3.9 3.5 3.1*  CL 98 100 98  CO2 31 28 25   GLUCOSE 124* 100* 130*  BUN 17 18 16   CREATININE 0.77 0.74 0.68  CALCIUM 9.9 10.0 10.5*   GFR: Estimated Creatinine Clearance: 64.7 mL/min (by C-G formula based on SCr of 0.68 mg/dL). Liver Function Tests: Recent Labs  Lab 05/27/20 1505 05/28/20 1711 05/29/20 0601  AST 16 17 17   ALT 10 9 10   ALKPHOS 89 88 89  BILITOT 1.1 1.3* 1.4*  PROT 7.9 7.7 7.8  ALBUMIN 4.5 4.5 4.6   No results for input(s): LIPASE, AMYLASE in the last 168 hours. Recent Labs  Lab 05/28/20 2112  AMMONIA 17   Coagulation Profile: No results for input(s): INR, PROTIME in the last 168 hours. Cardiac Enzymes: No results for input(s): CKTOTAL, CKMB, CKMBINDEX, TROPONINI in the last 168 hours. BNP (last 3 results) No results for input(s): PROBNP in the last 8760 hours. HbA1C: No results for input(s): HGBA1C in the last 72 hours. CBG: No results for input(s): GLUCAP in the last 168 hours. Lipid Profile: No results for input(s): CHOL, HDL, LDLCALC, TRIG, CHOLHDL, LDLDIRECT in the last 72  hours. Thyroid Function Tests: Recent Labs    05/29/20 0601  TSH 3.552   Anemia Panel: No results for input(s): VITAMINB12, FOLATE, FERRITIN, TIBC, IRON, RETICCTPCT in the last 72 hours. Sepsis Labs: Recent Labs  Lab 05/28/20 1711 05/28/20 2112  LATICACIDVEN 1.1 1.4    Recent Results (from the past 240 hour(s))  Culture, blood (routine x 2)     Status: None (Preliminary result)   Collection Time: 05/28/20  5:11 PM   Specimen: BLOOD LEFT HAND  Result Value Ref Range Status   Specimen Description   Final    BLOOD LEFT HAND Performed at [facility] [address] [place], [place] [address]    Special Requests   Final  BOTTLES DRAWN AEROBIC AND ANAEROBIC Blood Culture adequate volume Performed at [facility] [address] [place], [place] [address]    Culture   Final    NO GROWTH < 12 HOURS Performed at [facility] [address], [place], [place] [address]    Report Status PENDING  Incomplete  Culture, blood (routine x 2)     Status: None (Preliminary result)   Collection Time: 05/28/20  5:11 PM   Specimen: BLOOD RIGHT HAND  Result Value Ref Range Status   Specimen Description   Final    BLOOD RIGHT HAND Performed at [facility] [address], [place], [place] [address]    Special Requests   Final    BOTTLES DRAWN AEROBIC AND ANAEROBIC Blood Culture adequate volume Performed at [facility] [address], [place], [place] [address]    Culture   Final    NO GROWTH < 12 HOURS Performed at [facility] [address] [place], [place] [address]    Report Status PENDING  Incomplete  Respiratory Panel by RT PCR (Flu A&B, Covid) - Nasopharyngeal Swab     Status: None   Collection Time: 05/28/20  8:01 PM   Specimen: Nasopharyngeal Swab  Result Value Ref Range Status   SARS Coronavirus 2 by RT PCR NEGATIVE NEGATIVE Final    Comment: (NOTE) SARS-CoV-2 target nucleic  acids are NOT DETECTED.  The SARS-CoV-2 RNA is generally detectable in upper respiratoy specimens during the acute phase of infection. The lowest concentration of SARS-CoV-2 viral copies this assay can detect is 131 copies/mL. A negative result does not preclude SARS-Cov-2 infection and should not be used as the sole basis for treatment or other patient management decisions. A negative result may occur with  improper specimen collection/handling, submission of specimen other than nasopharyngeal swab, presence of viral mutation(s) within the areas targeted by this assay, and inadequate number of viral copies (<131 copies/mL). A negative result must be combined with clinical observations, patient history, and epidemiological information. The expected result is Negative.  Fact Sheet for Patients:  PinkCheek.be  Fact Sheet for Healthcare Providers:  GravelBags.it  This test is no t yet approved or cleared by the [place] FDA and  has been authorized for detection and/or diagnosis of SARS-CoV-2 by FDA under an Emergency Use Authorization (EUA). This EUA will remain  in effect (meaning this test can be used) for the duration of the COVID-19 declaration under Section 564(b)(1) of the Act, 21 U.S.C. section 360bbb-3(b)(1), unless the authorization is terminated or revoked sooner.     Influenza A by PCR NEGATIVE NEGATIVE Final   Influenza B by PCR NEGATIVE NEGATIVE Final    Comment: (NOTE) The Xpert Xpress SARS-CoV-2/FLU/RSV assay is intended as an aid in  the diagnosis of influenza from Nasopharyngeal swab specimens and  should not be used as a sole basis for treatment. Nasal washings and  aspirates are unacceptable for Xpert Xpress SARS-CoV-2/FLU/RSV  testing.  Fact Sheet for Patients: PinkCheek.be  Fact Sheet for Healthcare Providers: GravelBags.it  This test  is not yet approved or cleared by the [place] FDA and  has been authorized for detection and/or diagnosis of SARS-CoV-2 by  FDA under an Emergency Use Authorization (EUA). This EUA will remain  in effect (meaning this test can be used) for the duration of the  Covid-19 declaration under Section 564(b)(1) of the Act, 21  U.S.C. section 360bbb-3(b)(1), unless the authorization is  terminated or revoked. Performed at [facility], [place] [address], [place], [place] [address]       Radiology Studies: CT Head Wo Contrast  Result Date: 05/27/2020 CLINICAL DATA:  Delirium confusion EXAM: CT HEAD WITHOUT CONTRAST TECHNIQUE: Contiguous axial images were obtained from the base of the skull through the vertex without intravenous contrast. COMPARISON:  CT brain 02/01/2019 FINDINGS: Brain: No acute territorial infarction, hemorrhage or intracranial mass. Mild atrophy. Mild chronic small vessel ischemic change of the white matter. Stable ventricle size Vascular: No hyperdense vessels.  Carotid vascular calcification Skull: Normal. Negative for fracture or focal lesion. Sinuses/Orbits: No acute finding. Other: None IMPRESSION: 1. No CT evidence for acute intracranial abnormality. 2. Atrophy and mild chronic small vessel ischemic changes of the white matter. Electronically Signed   By: Donavan Foil M.D.   On: 05/27/2020 16:15   CT LUMBAR SPINE W CONTRAST  Result Date: 05/28/2020 CLINICAL DATA:  Low back pain. Infection suspected. Patient states spine injection 3 days ago. EXAM: CT LUMBAR SPINE WITH CONTRAST TECHNIQUE: Multidetector CT imaging of the lumbar spine was performed with intravenous contrast administration. CONTRAST:  132mL OMNIPAQUE IOHEXOL 300 MG/ML  SOLN COMPARISON:  MRI lumbar spine 05/20/2019 FINDINGS: Segmentation: 5 non rib-bearing lumbar type vertebral bodies are present. The lowest fully formed vertebral body is L5. Alignment: Slight degenerative anterolisthesis is  present at L4-5. Other significant listhesis is present. Extra convex curvature is centered at L2-3. Vertebrae: Vertebral body heights are maintained. Degenerative endplate sclerotic changes are noted on the left at L2-3 and to a lesser degree on the right at L4-5 and L5-S1. Paraspinal and other soft tissues: Right kidney is not visualized, question nephrectomy. Atherosclerotic changes are noted in the aorta without aneurysm. Visualized abdomen is otherwise unremarkable. Disc levels: T12-L1: Negative. L1-2: Negative. L2-3: Disc protrusion is present. Asymmetric facet hypertrophy is present. Left foraminal narrowing is noted. L3-4: Solid fusion is present. Wide laminectomy is noted. No residual or recurrent stenosis is present. L4-5: A broad-based disc protrusion is present. Vacuum disc is noted. Moderate facet hypertrophy is noted bilaterally. Central and foraminal narrowing is stable. L5-S1: Moderate facet hypertrophy is present, right greater than left. No significant stenosis is present. No pathologic enhancement is present. IMPRESSION: 1. Solid fusion and wide laminectomy at L3-4 without residual or recurrent stenosis. 2. 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 3. Left foraminal narrowing at L2-3 secondary to adjacent level disease leftward disc protrusion/endplate spurring. 4. Scoliosis is convex to the right at L2-3 and to the left at L5. 5. No enhancing epidural collections. MRI without and with contrast would be more sensitive for the evaluation of infection in the spine. 6. Aortic Atherosclerosis (ICD10-I70.0). Electronically Signed   By: San Morelle M.D.   On: 05/28/2020 18:53   MR BRAIN WO CONTRAST  Result Date: 05/27/2020 CLINICAL DATA:  Delirium. EXAM: MRI HEAD WITHOUT CONTRAST TECHNIQUE: Multiplanar, multiecho pulse sequences of the brain and surrounding structures were obtained without intravenous contrast. COMPARISON:  Head CT 05/27/2020 and MRI  04/14/2013 FINDINGS: Brain: There is no evidence of an acute infarct, intracranial hemorrhage, mass, midline shift, or extra-axial fluid collection. There is mild cerebral atrophy. A few small foci of T2 hyperintensity in the cerebral white matter and pons are nonspecific but compatible with minimal chronic small vessel ischemic disease, also not considered abnormal for age. Vascular: Major intracranial vascular flow voids are preserved. Skull and upper cervical spine: No suspicious marrow lesion. Asymmetrically advanced left-sided facet  arthrosis in the included upper cervical spine. Sinuses/Orbits: Bilateral cataract extraction. Paranasal sinuses and mastoid air cells are clear. Other: None. IMPRESSION: No acute intracranial abnormality. Electronically Signed   By: Logan Bores M.D.   On: 05/27/2020 20:41   DG Chest Portable 1 View  Result Date: 05/27/2020 CLINICAL DATA:  Altered mental status. EXAM: PORTABLE CHEST 1 VIEW COMPARISON:  02/08/2019. FINDINGS: Mediastinum and hilar structures normal. Stable cardiomegaly. Previously identified bilateral pulmonary infiltrates/edema have cleared. No focal infiltrate. Small left pleural effusion cannot be excluded. No pneumothorax. IMPRESSION: Stable cardiomegaly. Previously identified bilateral pulmonary infiltrates/edema on chest x-ray of 02/08/2019 have cleared. No focal infiltrate. Small left pleural effusion cannot be excluded. Electronically Signed   By: Marcello Moores  Register   On: 05/27/2020 15:39      Scheduled Meds: . apixaban  5 mg Oral BID  . atorvastatin  10 mg Oral Daily  . bisoprolol  2.5 mg Oral Daily  . feeding supplement (ENSURE ENLIVE)  237 mL Oral BID BM  . fentaNYL  1 patch Transdermal Q72H  . FLUoxetine  20 mg Oral QHS  . gabapentin  100 mg Oral TID  . [START ON 05/30/2020] levothyroxine  88 mcg Oral Q0600  . multivitamin with minerals  1 tablet Oral Daily  . pantoprazole (PROTONIX) IV  40 mg Intravenous Q24H  . spironolactone  25 mg  Oral Daily   Continuous Infusions: . sodium chloride 250 mL (05/28/20 2355)  . dextrose 5 % and 0.45 % NaCl with KCl 40 mEq/L 75 mL/hr at 05/29/20 1135     LOS: 0 days      Time spent: 45 minutes   Dessa Phi, DO [facility] 05/29/2020, 12:28 PM   Available via Epic secure chat 7am-7pm After these hours, please refer to coverage provider listed on [email or web address]

## 2020-05-29 NOTE — Progress Notes (Signed)
Initial Nutrition Assessment  DOCUMENTATION CODES:   Not applicable  INTERVENTION:    Ensure Enlive po BID, each supplement provides 350 kcal and 20 grams of protein  MVI daily   NUTRITION DIAGNOSIS:   Increased nutrient needs related to acute illness as evidenced by estimated needs.  GOAL:   Patient will meet greater than or equal to 90% of their needs  MONITOR:   PO intake, Supplement acceptance, Weight trends, Labs  REASON FOR ASSESSMENT:   Malnutrition Screening Tool    ASSESSMENT:   Patient with PMH significant for a.fib, pleural CVA, COPD, CKD with R sided nephrectomy, HTN, stroke, and cardiomyopathy. Presents this admission with severe low back pain after recent epidural injection and possible UTI.   Unable to reach pt by phone. No meal completions documented. Will need to obtain nutrition and weight history. RD to provide supplementation to maximize kcal and protein this admission.   Records indicate pt weighed 53.6 kg on 9/21 and 79.5 kg yesterday. Question weighing error. Will need to obtain new weight to assess for weight loss.   Drips: D5 and 1/2 NS with 40 mEq KCl @ 75 ml/hr  Medications: aldactone Labs: K 3.1 (L)  Diet Order:   Diet Order            Diet regular Room service appropriate? Yes; Fluid consistency: Thin  Diet effective now                 EDUCATION NEEDS:   Not appropriate for education at this time  Skin:  Skin Assessment: Reviewed RN Assessment  Last BM:  10/9  Height:   Ht Readings from Last 1 Encounters:  05/11/20 5\' 6"  (1.676 m)    Weight:   Wt Readings from Last 1 Encounters:  05/28/20 79.5 kg   BMI:  Body mass index is 28.29 kg/m.  Estimated Nutritional Needs:   Kcal:  7408-1448  Protein:  90-105 grams  Fluid:  >/= 1.7 L/day   Mariana Single RD, LDN Clinical Nutrition Pager listed in [place]

## 2020-05-29 NOTE — Plan of Care (Signed)

## 2020-05-29 NOTE — Progress Notes (Signed)
PT Cancellation Note  Patient Details Name: [name] MRN: [id number] DOB: 07-Feb-1946   Cancelled Treatment:    Reason Eval/Treat Not Completed: Medical issues which prohibited therapy Pt received Ativan earlier and currently sleeping.  RN requests to wait on PT eval this morning. Also pending MRI. Will check back as schedule permits.     Raahi Korber,[name] 05/29/2020, 11:39 AM Arlyce Dice, DPT Acute Rehabilitation Services Pager: [phone] Office: [phone]

## 2020-05-30 LAB — BASIC METABOLIC PANEL
Anion gap: 12 (ref 5–15)
BUN: 16 mg/dL (ref 8–23)
CO2: 26 mmol/L (ref 22–32)
Calcium: 9.5 mg/dL (ref 8.9–10.3)
Chloride: 100 mmol/L (ref 98–111)
Creatinine, Ser: 0.8 mg/dL (ref 0.44–1.00)
GFR, Estimated: >60 mL/min (ref >60)
Glucose, Bld: 123 mg/dL — ABNORMAL HIGH (ref 70–99)
Potassium: 3.8 mmol/L (ref 3.5–5.1)
Sodium: 138 mmol/L (ref 135–145)

## 2020-05-30 LAB — CBC
HCT: 42.4 % (ref 36.0–46.0)
Hemoglobin: 13.7 g/dL (ref 12.0–15.0)
MCH: 30.5 pg (ref 26.0–34.0)
MCHC: 32.3 g/dL (ref 30.0–36.0)
MCV: 94.4 fL (ref 80.0–100.0)
Platelets: 311 10*3/uL (ref 150–400)
RBC: 4.49 MIL/uL (ref 3.87–5.11)
RDW: 13 % (ref 11.5–15.5)
WBC: 10.4 10*3/uL (ref 4.0–10.5)
nRBC: 0 % (ref 0.0–0.2)

## 2020-05-30 LAB — URINE CULTURE: Culture: <10000 — AB

## 2020-05-30 LAB — MAGNESIUM: Magnesium: 2 mg/dL (ref 1.7–2.4)

## 2020-05-30 MED ORDER — SODIUM CHLORIDE 0.9 % IV BOLUS
500.0000 mL | Freq: Once | INTRAVENOUS | Status: AC
  Administered 2020-05-30: 500 mL via INTRAVENOUS

## 2020-05-30 MED ORDER — PANTOPRAZOLE SODIUM 40 MG PO TBEC
40.0000 mg | DELAYED_RELEASE_TABLET | Freq: Every day | ORAL | Status: DC
  Administered 2020-05-31: 40 mg via ORAL
  Filled 2020-05-30: qty 1

## 2020-05-30 MED ORDER — SODIUM CHLORIDE 0.9 % IV SOLN: INTRAVENOUS | Status: DC

## 2020-05-30 NOTE — Evaluation (Signed)
Occupational Therapy Evaluation Patient Details Name: [name] MRN: [id number] DOB: 10/09/1945 Today's Date: 05/30/2020    History of Present Illness [name] is a 74 y.o. female with history of atrial fibrillation, CVA, COPD, right-sided nephrectomy, hypothyroidism, cardiomyopathy with improved EF has been experiencing worsening low back pain since patient received epidural injection on Tuesday on May 25, 2020.Admitted for confusion and worsening low back pain   Clinical Impression   [name] is a 74 year old woman admitted to hospital with intractable back pain and altered mental status. Son reports patient's cognition had been impaired for three weeks. On evaluation patient demonstrate near baseline abilities to perform ADLs and functional mobility with rollator - needing verbal cues to stay on task due to rambling speech. Patient's cognition continues to be impaired - though better than on admission. Exhibits poor attention, word finding issues, tangential conversation and impaired memory. No OT needs at this time.     Follow Up Recommendations  No OT follow up;Supervision/Assistance - 24 hour    Equipment Recommendations  None recommended by OT    Recommendations for Other Services  (May need an outpatient cognition evaluation in the future if patient doesn't return to baseline. Discussed with patient's son.)     Precautions / Restrictions Precautions Precautions: None Restrictions Weight Bearing Restrictions: No      Mobility Bed Mobility Overal bed mobility: Modified Independent             General bed mobility comments: increased time but no physical assistance.  Transfers Overall transfer level: Modified independent Equipment used: 4-wheeled walker             General transfer comment: ambulated in hall with rollator.    Balance Overall balance assessment: No apparent balance deficits (not formally assessed)                                          ADL either performed or assessed with clinical judgement   ADL Overall ADL's : At baseline                                       General ADL Comments: At baseline in regards to physical abilities. verbal cues needed to stay on task     Vision Patient Visual Report: No change from baseline       Perception     Praxis      Pertinent Vitals/Pain Pain Assessment: 0-10 Pain Score: 3  Pain Location: back Pain Intervention(s): Monitored during session     Hand Dominance Right   Extremity/Trunk Assessment Upper Extremity Assessment Upper Extremity Assessment: RUE deficits/detail;LUE deficits/detail RUE Deficits / Details: Hx of clavicular fracture - decreased shoulder ROM and reports of pain with lifting. otherwise functional ROM, generalized weakness, arthritic changes in hand RUE Coordination: WNL LUE Deficits / Details: Functional ROM, generalized weakness, arthritic changes in hand LUE Sensation: WNL LUE Coordination: WNL   Lower Extremity Assessment Lower Extremity Assessment: Defer to PT evaluation   Cervical / Trunk Assessment Cervical / Trunk Assessment: Kyphotic   Communication Communication Communication: No difficulties   Cognition Arousal/Alertness: Awake/alert Behavior During Therapy: WFL for tasks assessed/performed Overall Cognitive Status: Impaired/Different from baseline Area of Impairment: Memory;Attention  Current Attention Level: Sustained Memory: Decreased short-term memory         General Comments: Son reports patient's cognition (impaired attention, memory deficits and tangential conversation) is still impaired compared to baseline.   General Comments       Exercises     Shoulder Instructions      Home Living Family/patient expects to be discharged to:: Private residence Living Arrangements: Spouse/significant other Available Help at Discharge:  Family;Available PRN/intermittently Type of Home:  (condo) Home Access: Stairs to enter [employer] of Steps: 3   Home Layout: One level     Bathroom Shower/Tub: Occupational psychologist: Standard     Home Equipment: Environmental consultant - 4 wheels;Shower seat;Bedside commode;Wheelchair - manual;Grab bars - tub/shower          Prior Functioning/Environment Level of Independence: Independent with assistive device(s)                 OT Problem List: Decreased cognition      OT Treatment/Interventions:      OT Goals(Current goals can be found in the care plan section) Acute Rehab OT Goals OT Goal Formulation: All assessment and education complete, DC therapy  OT Frequency:     Barriers to D/C:            Co-evaluation PT/OT/SLP Co-Evaluation/Treatment: Yes Reason for Co-Treatment: For patient/therapist safety;To address functional/ADL transfers (co eval) PT goals addressed during session: Mobility/safety with mobility OT goals addressed during session: ADL's and self-care      AM-PAC OT "6 Clicks" Daily Activity     Outcome Measure Help from another person eating meals?: None Help from another person taking care of personal grooming?: None Help from another person toileting, which includes using toliet, bedpan, or urinal?: None Help from another person bathing (including washing, rinsing, drying)?: None Help from another person to put on and taking off regular upper body clothing?: None Help from another person to put on and taking off regular lower body clothing?: None 6 Click Score: 24   End of Session Equipment Utilized During Treatment: Gait belt;Rolling walker Nurse Communication: Mobility status  Activity Tolerance: Patient tolerated treatment well Patient left: in chair;with call bell/phone within reach;with chair alarm set;with family/visitor present  OT Visit Diagnosis: Pain;Other symptoms and signs involving cognitive function Pain - part  of body:  (back)                Time: 1400-1421 OT Time Calculation (min): 21 min Charges:  OT General Charges $OT Visit: 1 Visit OT Evaluation $OT Eval Low Complexity: 1 Low  Jebadiah Imperato, OTR/L [place]  Office [phone] Pager: [place] 05/30/2020, 2:35 PM

## 2020-05-30 NOTE — Plan of Care (Signed)

## 2020-05-30 NOTE — Progress Notes (Signed)
PROGRESS NOTE    [name]  KTG:[id number] DOB: Feb 11, 1946 DOA: 05/28/2020 PCP: Prince Solian, MD     Brief Narrative:  [name] is a 74 y.o. female with history of atrial fibrillation, CVA, COPD, right-sided nephrectomy, hypothyroidism, cardiomyopathy with improved EF has been experiencing worsening low back pain since patient received epidural injection on Tuesday on May 25, 2020 notes around 3 days ago.  The following day patient did come to the ER due to confusion and worsening low back pain.  Since patient was off Eliquis for the injection, MRI of the brain was done which did not show anything acute.  Patient was discharged home.  As per the husband patient's pain has been worsening and has become more confused. CT lumbar spine does not show any fluid collection.  After admission patient did have an episode of A. fib with RVR for which she received 1 dose of Cardizem 10 mg IV. Patient admitted for acute encephalopathy.   New events last 24 hours / Subjective: Husband is at bedside. Patient has been resting overnight, less restless. On exam, she is easily arousable. She was able to sit up in bed, able to tell me that she is at the hospital and that she's here because "I'm puny." She was able to tell RN that she would drink ginger ale, and took her medication with apple sauce. She was able to tell me her husband's name and her childrens' names. She denied pain today. Has not eaten much at all yesterday. Had low UOP overnight requiring IVF bolus.   Assessment & Plan:   Principal Problem:   Low back pain Active Problems:   HTN (hypertension)   Persistent atrial fibrillation   Acute encephalopathy   Hypothyroidism   Acute metabolic encephalopathy -Could be secondary to polypharmacy as patient is on many mentation altering medications, could be a pain response, question if this is related to her steroid injection -Discussed over the phone with Dr. Cheral Marker of neurology  10/9, he recommended stopping many of her home medications as this could contribute to her encephalopathy, delirium precautions as well with frequent reorientation. Recommended low dose seroquel qhs  -Improved last 24 hours. Continue to monitor. Not baseline yet.   Severe low back pain -Status post epidural steroid injection on 10/6 -CT lumbar spine without fluid collection -MRI lumbar spine canceled. Unsure if this will add benefit and patient may not tolerate transfer to [facility] and sedating medications (per family, patient has not tolerated ativan/haldol in the past)  -PT OT  Leukocytosis -UA does not suggest UTI.  Urine culture negative. Blood cultures NGTD  -Resolved   A. fib RVR -Patient received 1 dose of IV Cardizem -Continue bisoprolol, Eliquis  Hypertension -Continue spironolactone  Hypothyroidism -TSH normal 3.552 -Continue Synthroid  Depression -Continue Prozac -Hold Wellbutrin  Poor PO intake -Continue IVF and encourage PO      DVT prophylaxis:  SCDs Start: 05/28/20 2107 apixaban (ELIQUIS) tablet 5 mg  Code Status: Full code Family Communication: Spoke with husband at bedside, called daughter as well as son-in-law over the phone for an update Disposition Plan:  Status is: Inpatient  Remains inpatient appropriate because:Altered mental status   Dispo: The patient is from: Home              Anticipated d/c is to: Home              Anticipated d/c date is: 2 days  Patient currently is not medically stable to d/c.  AMS improved but not baseline. Continue monitoring and PT OT eval pending. Poor PO intake and remains on IVF   Consultants:   Neurology over the phone  Procedures:   None  Antimicrobials:  Anti-infectives (From admission, onward)   Start     Dose/Rate Route Frequency Ordered Stop   05/28/20 2200  ciprofloxacin (CIPRO) IVPB 400 mg  Status:  Discontinued        400 mg 200 mL/hr over 60 Minutes Intravenous Every 12 hours  05/28/20 2116 05/28/20 2121   05/28/20 2200  cefTRIAXone (ROCEPHIN) 1 g in sodium chloride 0.9 % 100 mL IVPB  Status:  Discontinued        1 g 200 mL/hr over 30 Minutes Intravenous Daily 05/28/20 2121 05/29/20 0801       Objective: Vitals:   05/29/20 1356 05/29/20 1703 05/29/20 2050 05/30/20 0449  BP: (!) 168/108  (!) 146/90 (!) 126/92  Pulse: 93  87 92  Resp: 19  18 14   Temp: 98.5 F (36.9 C)  98 F (36.7 C) 98.9 F (37.2 C)  TempSrc: Oral  Oral Oral  SpO2: 98%  97% 95%  Weight:      Height:  5\' 5"  (1.651 m)      Intake/Output Summary (Last 24 hours) at 05/30/2020 1220 Last data filed at 05/30/2020 0629 Gross per 24 hour  Intake 901.02 ml  Output --  Net 901.02 ml   Filed Weights   05/28/20 2117  Weight: 79.5 kg    Examination: General exam: Appears calm and comfortable  Respiratory system: Clear to auscultation. Respiratory effort normal. Cardiovascular system: S1 & S2 heard, irregular rhythm. No pedal edema. Gastrointestinal system: Abdomen is nondistended, soft and nontender. Normal bowel sounds heard. Central nervous system: Alert and oriented to self and place Extremities: Symmetric in appearance bilaterally  Skin: No rashes, lesions or ulcers on exposed skin  Psychiatry: Remains a poor historian still  Data Reviewed: I have personally reviewed following labs and imaging studies  CBC: Recent Labs  Lab 05/27/20 1505 05/28/20 1711 05/29/20 0601 05/30/20 0519  WBC 5.4 7.5 11.9* 10.4  NEUTROABS 4.2 5.8 10.0*  --   HGB 12.5 14.0 14.1 13.7  HCT 39.1 43.1 42.9 42.4  MCV 94.4 94.1 92.3 94.4  PLT 251 298 322 335   Basic Metabolic Panel: Recent Labs  Lab 05/27/20 1505 05/28/20 1711 05/29/20 0601 05/30/20 0519  NA 139 142 143 138  K 3.9 3.5 3.1* 3.8  CL 98 100 98 100  CO2 31 28 25 26   GLUCOSE 124* 100* 130* 123*  BUN 17 18 16 16   CREATININE 0.77 0.74 0.68 0.80  CALCIUM 9.9 10.0 10.5* 9.5  MG  --   --   --  2.0   GFR: Estimated Creatinine  Clearance: 63.3 mL/min (by C-G formula based on SCr of 0.8 mg/dL). Liver Function Tests: Recent Labs  Lab 05/27/20 1505 05/28/20 1711 05/29/20 0601  AST 16 17 17   ALT 10 9 10   ALKPHOS 89 88 89  BILITOT 1.1 1.3* 1.4*  PROT 7.9 7.7 7.8  ALBUMIN 4.5 4.5 4.6   No results for input(s): LIPASE, AMYLASE in the last 168 hours. Recent Labs  Lab 05/28/20 2112  AMMONIA 17   Coagulation Profile: No results for input(s): INR, PROTIME in the last 168 hours. Cardiac Enzymes: No results for input(s): CKTOTAL, CKMB, CKMBINDEX, TROPONINI in the last 168 hours. BNP (last 3 results) No results for input(s):  PROBNP in the last 8760 hours. HbA1C: No results for input(s): HGBA1C in the last 72 hours. CBG: No results for input(s): GLUCAP in the last 168 hours. Lipid Profile: No results for input(s): CHOL, HDL, LDLCALC, TRIG, CHOLHDL, LDLDIRECT in the last 72 hours. Thyroid Function Tests: Recent Labs    05/29/20 0601  TSH 3.552   Anemia Panel: No results for input(s): VITAMINB12, FOLATE, FERRITIN, TIBC, IRON, RETICCTPCT in the last 72 hours. Sepsis Labs: Recent Labs  Lab 05/28/20 1711 05/28/20 2112  LATICACIDVEN 1.1 1.4    Recent Results (from the past 240 hour(s))  Culture, blood (routine x 2)     Status: None (Preliminary result)   Collection Time: 05/28/20  5:11 PM   Specimen: BLOOD LEFT HAND  Result Value Ref Range Status   Specimen Description   Final    BLOOD LEFT HAND Performed at [facility] [address] [place], [place] [address]    Special Requests   Final    BOTTLES DRAWN AEROBIC AND ANAEROBIC Blood Culture adequate volume Performed at [facility] [address], [place], [place] [address]    Culture   Final    NO GROWTH < 12 HOURS Performed at [facility] [address] [place], [place] [address]    Report Status PENDING  Incomplete  Culture, blood (routine x 2)     Status: None (Preliminary result)    Collection Time: 05/28/20  5:11 PM   Specimen: BLOOD RIGHT HAND  Result Value Ref Range Status   Specimen Description   Final    BLOOD RIGHT HAND Performed at [facility] [address], [place], [place] [address]    Special Requests   Final    BOTTLES DRAWN AEROBIC AND ANAEROBIC Blood Culture adequate volume Performed at [facility] [address], [place], [place] [address]    Culture   Final    NO GROWTH < 12 HOURS Performed at [facility] [address] [place], [place] [address]    Report Status PENDING  Incomplete  Urine culture     Status: Abnormal   Collection Time: 05/28/20  5:44 PM   Specimen: Urine, Random  Result Value Ref Range Status   Specimen Description   Final    URINE, RANDOM Performed at [facility] [address], [place], [place] [address]    Special Requests   Final    NONE Performed at [facility], [place] [address], [place], [place] [address]    Culture (A)  Final    <10,000 COLONIES/mL INSIGNIFICANT GROWTH Performed at [facility] [address] [place], [place] [address]    Report Status 05/30/2020 FINAL  Final  Respiratory Panel by RT PCR (Flu A&B, Covid) - Nasopharyngeal Swab     Status: None   Collection Time: 05/28/20  8:01 PM   Specimen: Nasopharyngeal Swab  Result Value Ref Range Status   SARS Coronavirus 2 by RT PCR NEGATIVE NEGATIVE Final    Comment: (NOTE) SARS-CoV-2 target nucleic acids are NOT DETECTED.  The SARS-CoV-2 RNA is generally detectable in upper respiratoy specimens during the acute phase of infection. The lowest concentration of SARS-CoV-2 viral copies this assay can detect is 131 copies/mL. A negative result does not preclude SARS-Cov-2 infection and should not be used as the sole basis for treatment or other patient management decisions. A negative result may occur with  improper specimen collection/handling, submission  of  specimen other than nasopharyngeal swab, presence of viral mutation(s) within the areas targeted by this assay, and inadequate number of viral copies (<131 copies/mL). A negative result must be combined with clinical observations, patient history, and epidemiological information. The expected result is Negative.  Fact Sheet for Patients:  PinkCheek.be  Fact Sheet for Healthcare Providers:  GravelBags.it  This test is no t yet approved or cleared by the [place] FDA and  has been authorized for detection and/or diagnosis of SARS-CoV-2 by FDA under an Emergency Use Authorization (EUA). This EUA will remain  in effect (meaning this test can be used) for the duration of the COVID-19 declaration under Section 564(b)(1) of the Act, 21 U.S.C. section 360bbb-3(b)(1), unless the authorization is terminated or revoked sooner.     Influenza A by PCR NEGATIVE NEGATIVE Final   Influenza B by PCR NEGATIVE NEGATIVE Final    Comment: (NOTE) The Xpert Xpress SARS-CoV-2/FLU/RSV assay is intended as an aid in  the diagnosis of influenza from Nasopharyngeal swab specimens and  should not be used as a sole basis for treatment. Nasal washings and  aspirates are unacceptable for Xpert Xpress SARS-CoV-2/FLU/RSV  testing.  Fact Sheet for Patients: PinkCheek.be  Fact Sheet for Healthcare Providers: GravelBags.it  This test is not yet approved or cleared by the [place] FDA and  has been authorized for detection and/or diagnosis of SARS-CoV-2 by  FDA under an Emergency Use Authorization (EUA). This EUA will remain  in effect (meaning this test can be used) for the duration of the  Covid-19 declaration under Section 564(b)(1) of the Act, 21  U.S.C. section 360bbb-3(b)(1), unless the authorization is  terminated or revoked. Performed at [facility], [place]  [address] [place], [place] [address]       Radiology Studies: CT LUMBAR SPINE W CONTRAST  Result Date: 05/28/2020 CLINICAL DATA:  Low back pain. Infection suspected. Patient states spine injection 3 days ago. EXAM: CT LUMBAR SPINE WITH CONTRAST TECHNIQUE: Multidetector CT imaging of the lumbar spine was performed with intravenous contrast administration. CONTRAST:  167mL OMNIPAQUE IOHEXOL 300 MG/ML  SOLN COMPARISON:  MRI lumbar spine 05/20/2019 FINDINGS: Segmentation: 5 non rib-bearing lumbar type vertebral bodies are present. The lowest fully formed vertebral body is L5. Alignment: Slight degenerative anterolisthesis is present at L4-5. Other significant listhesis is present. Extra convex curvature is centered at L2-3. Vertebrae: Vertebral body heights are maintained. Degenerative endplate sclerotic changes are noted on the left at L2-3 and to a lesser degree on the right at L4-5 and L5-S1. Paraspinal and other soft tissues: Right kidney is not visualized, question nephrectomy. Atherosclerotic changes are noted in the aorta without aneurysm. Visualized abdomen is otherwise unremarkable. Disc levels: T12-L1: Negative. L1-2: Negative. L2-3: Disc protrusion is present. Asymmetric facet hypertrophy is present. Left foraminal narrowing is noted. L3-4: Solid fusion is present. Wide laminectomy is noted. No residual or recurrent stenosis is present. L4-5: A broad-based disc protrusion is present. Vacuum disc is noted. Moderate facet hypertrophy is noted bilaterally. Central and foraminal narrowing is stable. L5-S1: Moderate facet hypertrophy is present, right greater than left. No significant stenosis is present. No pathologic enhancement is present. IMPRESSION: 1. Solid fusion and wide laminectomy at L3-4 without residual or recurrent stenosis. 2. 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 3. Left foraminal narrowing at L2-3 secondary to adjacent level disease leftward disc  protrusion/endplate spurring. 4. Scoliosis is convex to the right at L2-3 and to the left at  L5. 5. No enhancing epidural collections. MRI without and with contrast would be more sensitive for the evaluation of infection in the spine. 6. Aortic Atherosclerosis (ICD10-I70.0). Electronically Signed   By: San Morelle M.D.   On: 05/28/2020 18:53      Scheduled Meds: . apixaban  5 mg Oral BID  . atorvastatin  10 mg Oral Daily  . bisoprolol  2.5 mg Oral Daily  . feeding supplement (ENSURE ENLIVE)  237 mL Oral BID BM  . fentaNYL  1 patch Transdermal Q72H  . FLUoxetine  20 mg Oral QHS  . levothyroxine  88 mcg Oral Q0600  . multivitamin with minerals  1 tablet Oral Daily  . [START ON 05/31/2020] pantoprazole  40 mg Oral Daily  . QUEtiapine  25 mg Oral QHS  . spironolactone  25 mg Oral Daily   Continuous Infusions: . sodium chloride 250 mL (05/28/20 2355)  . sodium chloride 100 mL/hr at 05/30/20 1006     LOS: 1 day      Time spent: 45 minutes   Dessa Phi, DO [facility] 05/30/2020, 12:20 PM   Available via Epic secure chat 7am-7pm After these hours, please refer to coverage provider listed on [email or web address]

## 2020-05-30 NOTE — Evaluation (Signed)
Physical Therapy Evaluation Patient Details Name: [name] MRN: [id number] DOB: 21-May-1946 Today's Date: 05/30/2020   History of Present Illness  [name] is a 74 y.o. female with history of atrial fibrillation, CVA, COPD, right-sided nephrectomy, hypothyroidism, cardiomyopathy with improved EF has been experiencing worsening low back pain since patient received epidural injection on Tuesday on May 25, 2020.Admitted for confusion and worsening low back pain  Clinical Impression  On eval, pt was Supv with mobility. She tolerated activity well-walked ~150 feet with rollator. Cognition is still somewhat impaired (attention, word-finding per pt/son report). Minimal back pain reported during session. Do not anticipate any f/u PT needs at this time. Will plan to follow during hospital stay.     Follow Up Recommendations No PT follow up;Supervision/Assistance - 24 hour    Equipment Recommendations  None recommended by PT    Recommendations for Other Services       Precautions / Restrictions Precautions Precautions: Fall Restrictions Weight Bearing Restrictions: No      Mobility  Bed Mobility Overal bed mobility: Modified Independent             General bed mobility comments: increased time but no physical assistance.  Transfers Overall transfer level: Needs assistance Equipment used: 4-wheeled walker Transfers: Sit to/from Stand Sit to Stand: Supervision         General transfer comment: for safety, hand placement, proper operation of rollator  Ambulation/Gait Ambulation/Gait assistance: Supervision Gait Distance (Feet): 150 Feet Assistive device: 4-wheeled walker Gait Pattern/deviations: Step-through pattern     General Gait Details: no lob with RW use. pt not any worse with ambulation  Stairs            Wheelchair Mobility    Modified Rankin (Stroke Patients Only)       Balance Overall balance assessment: Mild deficits observed, not  formally tested                                           Pertinent Vitals/Pain Pain Assessment: Faces Pain Score: 3  Faces Pain Scale: Hurts a little bit Pain Location: back Pain Descriptors / Indicators: Discomfort;Sore Pain Intervention(s): Monitored during session    Home Living Family/patient expects to be discharged to:: Private residence Living Arrangements: Spouse/significant other Available Help at Discharge: Family;Available PRN/intermittently Type of Home: Apartment Home Access: Stairs to enter   Entrance Stairs-Number of Steps: 3 Home Layout: One level Home Equipment: Walker - 4 wheels;Shower seat;Bedside commode;Wheelchair - manual;Grab bars - tub/shower      Prior Function Level of Independence: Independent with assistive device(s)               Hand Dominance   Dominant Hand: Right    Extremity/Trunk Assessment   Upper Extremity Assessment Upper Extremity Assessment: Defer to OT evaluation RUE Deficits / Details: Hx of clavicular fracture - decreased shoulder ROM and reports of pain with lifting. otherwise functional ROM, generalized weakness, arthritic changes in hand RUE Coordination: WNL LUE Deficits / Details: Functional ROM, generalized weakness, arthritic changes in hand LUE Sensation: WNL LUE Coordination: WNL    Lower Extremity Assessment Lower Extremity Assessment: Generalized weakness    Cervical / Trunk Assessment Cervical / Trunk Assessment: Kyphotic  Communication   Communication: No difficulties  Cognition Arousal/Alertness: Awake/alert Behavior During Therapy: WFL for tasks assessed/performed Overall Cognitive Status: Impaired/Different from baseline Area of Impairment: Attention  Current Attention Level: Sustained Memory: Decreased short-term memory         General Comments: Son reports patient's cognition (impaired attention, memory deficits and tangential conversation) is  still impaired compared to baseline.      General Comments      Exercises     Assessment/Plan    PT Assessment Patient needs continued PT services  PT Problem List Pain       PT Treatment Interventions DME instruction;Gait training;Therapeutic activities;Therapeutic exercise;Patient/family education;Balance training;Functional mobility training    PT Goals (Current goals can be found in the Care Plan section)  Acute Rehab PT Goals Patient Stated Goal: home PT Goal Formulation: With patient/family Time For Goal Achievement: 06/13/20 Potential to Achieve Goals: Good    Frequency Min 3X/week   Barriers to discharge        Co-evaluation PT/OT/SLP Co-Evaluation/Treatment: Yes Reason for Co-Treatment: For patient/therapist safety PT goals addressed during session: Mobility/safety with mobility;Proper use of DME OT goals addressed during session: ADL's and self-care       AM-PAC PT "6 Clicks" Mobility  Outcome Measure Help needed turning from your back to your side while in a flat bed without using bedrails?: None Help needed moving from lying on your back to sitting on the side of a flat bed without using bedrails?: None Help needed moving to and from a bed to a chair (including a wheelchair)?: None Help needed standing up from a chair using your arms (e.g., wheelchair or bedside chair)?: None Help needed to walk in hospital room?: A Little Help needed climbing 3-5 steps with a railing? : A Little 6 Click Score: 22    End of Session Equipment Utilized During Treatment: Gait belt Activity Tolerance: Patient tolerated treatment well Patient left: in chair;with call bell/phone within reach;with chair alarm set;with family/visitor present   PT Visit Diagnosis: Unsteadiness on feet (R26.81);Pain Pain - part of body:  (back)    Time: 3244-0102 PT Time Calculation (min) (ACUTE ONLY): 21 min   Charges:   PT Evaluation $PT Eval Low Complexity: [place], PT Acute Rehabilitation  Office: [phone] Pager: [phone]

## 2020-05-30 NOTE — Progress Notes (Signed)
Patient with no urine output throughout the night husband at bedside, bed linens dry.  Bladder scan done showing 2104ml, messaged on call MD with new orders for 589ml 0.9% Normal saline bolus.  Bolus started and pt and husband informed

## 2020-05-31 LAB — CBC
HCT: 41.2 % (ref 36.0–46.0)
Hemoglobin: 13.6 g/dL (ref 12.0–15.0)
MCH: 30.6 pg (ref 26.0–34.0)
MCHC: 33 g/dL (ref 30.0–36.0)
MCV: 92.8 fL (ref 80.0–100.0)
Platelets: 242 10*3/uL (ref 150–400)
RBC: 4.44 MIL/uL (ref 3.87–5.11)
RDW: 12.9 % (ref 11.5–15.5)
WBC: 8.1 10*3/uL (ref 4.0–10.5)
nRBC: 0 % (ref 0.0–0.2)

## 2020-05-31 LAB — BASIC METABOLIC PANEL
Anion gap: 9 (ref 5–15)
BUN: 14 mg/dL (ref 8–23)
CO2: 25 mmol/L (ref 22–32)
Calcium: 9.2 mg/dL (ref 8.9–10.3)
Chloride: 105 mmol/L (ref 98–111)
Creatinine, Ser: 0.75 mg/dL (ref 0.44–1.00)
GFR, Estimated: >60 mL/min (ref >60)
Glucose, Bld: 94 mg/dL (ref 70–99)
Potassium: 3.6 mmol/L (ref 3.5–5.1)
Sodium: 139 mmol/L (ref 135–145)

## 2020-05-31 LAB — MAGNESIUM: Magnesium: 1.7 mg/dL (ref 1.7–2.4)

## 2020-05-31 NOTE — Discharge Summary (Signed)
Physician Discharge Summary  [name] FMB:[id number] DOB: 06/15/1946 DOA: 05/28/2020  PCP: Prince Solian, MD  Admit date: 05/28/2020 Discharge date: 05/31/2020  Admitted From: home Disposition:  home  Recommendations for Outpatient Follow-up:  1. Follow up with PCP in 1 week 2. Follow up with Pain Management specialist as scheduled   Discharge Condition: Stable CODE STATUS: Full  Diet recommendation:  Diet Orders (From admission, onward)    Start     Ordered   05/29/20 1119  Diet regular Room service appropriate? Yes; Fluid consistency: Thin  Diet effective now       Question Answer Comment  Room service appropriate? Yes   Fluid consistency: Thin      05/29/20 1118         Brief/Interim Summary: [name] is a 74 y.o.femalewithhistory of atrial fibrillation, CVA, COPD, right-sided nephrectomy, hypothyroidism, cardiomyopathy with improved EF has been experiencing worsening low back pain since patient received epidural injection on Tuesday on May 25, 2020 notes around 3 days ago. The following day patient did come to the ER due to confusion and worsening low back pain. Since patient was off Eliquis for the injection, MRI of the brain was done which did not show anything acute. Patient was discharged home. As per the husband patient's pain has been worsening and has become more confused. CT lumbar spine does not show any fluid collection. After admission patient did have an episode of A. fib with RVR for which she received 1 dose of Cardizem 10 mg IV.Patient admitted for acute encephalopathy. Many of her mind-altering medications were held. Over the next 2 days, she continued to have mentation improvements. On day of discharge, she remained alert and oriented x 4 and back to her baseline.   Discharge Diagnoses:  Principal Problem:   Low back pain Active Problems:   HTN (hypertension)   Persistent atrial fibrillation   Acute encephalopathy    Hypothyroidism   Acute metabolic encephalopathy -Could be secondary to polypharmacy as patient is on many mentation altering medications, could be a pain response, question if this is related to her steroid injection -Discussed over the phone with Dr. Cheral Marker of neurology 10/9, he recommended stopping many of her home medications as this could contribute to her encephalopathy, delirium precautions as well with frequent reorientation. -Resolved and back to baseline. Discussed with patient and family to continue to wean down on many of her medications   Severe low back pain -Status post epidural steroid injection on 10/6 -CT lumbar spine without fluid collection -MRI lumbar spine canceled. Unsure if this will add benefit and patient may not tolerate transfer to [facility] and sedating medications (per family, patient has not tolerated ativan/haldol in the past)  -PT OT without new recs -No complaints of back pain today   Leukocytosis -UA does not suggest UTI.  Urine culture negative. Blood cultures NGTD  -Resolved   A. fib RVR -Patient received 1 dose of IV Cardizem -Continue bisoprolol, Eliquis -Rate well controlled today   Hypertension -Continue spironolactone  Hypothyroidism -TSH normal 3.552 -Continue Synthroid  Depression -Continue Prozac, wellbutrin      Discharge Instructions  Discharge Instructions    Call MD for:  difficulty breathing, headache or visual disturbances   Complete by: As directed    Call MD for:  extreme fatigue   Complete by: As directed    Call MD for:  persistant dizziness or light-headedness   Complete by: As directed    Call MD for:  persistant  nausea and vomiting   Complete by: As directed    Call MD for:  severe uncontrolled pain   Complete by: As directed    Call MD for:  temperature >100.4   Complete by: As directed    Discharge instructions   Complete by: As directed    You were cared for by a hospitalist during your hospital stay.  If you have any questions about your discharge medications or the care you received while you were in the hospital after you are discharged, you can call the unit and ask to speak with the hospitalist on call if the hospitalist that took care of you is not available. Once you are discharged, your primary care physician will handle any further medical issues. Please note that NO REFILLS for any discharge medications will be authorized once you are discharged, as it is imperative that you return to your primary care physician (or establish a relationship with a primary care physician if you do not have one) for your aftercare needs so that they can reassess your need for medications and monitor your lab values.   Increase activity slowly   Complete by: As directed      Allergies as of 05/31/2020      Reactions   Penicillins    Causes rash Has patient had a PCN reaction causing immediate rash, facial/tongue/throat swelling, SOB or lightheadedness with hypotension: YES Has patient had a PCN reaction causing severe rash involving mucus membranes or skin necrosis: No Has patient had a PCN reaction that required hospitalization No Has patient had a PCN reaction occurring within the last 10 years: No If all of the above answers are "NO", then may proceed with Cephalosporin use.      Medication List    STOP taking these medications   methocarbamol 500 MG tablet Commonly known as: Robaxin   rOPINIRole 1 MG tablet Commonly known as: REQUIP   zolpidem 10 MG tablet Commonly known as: AMBIEN     TAKE these medications   acetaminophen 325 MG tablet Commonly known as: TYLENOL Take 2 tablets (650 mg total) by mouth every 8 (eight) hours.   apixaban 5 MG Tabs tablet Commonly known as: ELIQUIS Take 1 tablet (5 mg total) by mouth 2 (two) times daily. What changed: when to take this   atorvastatin 10 MG tablet Commonly known as: LIPITOR Take 1 tablet (10 mg total) by mouth daily.   bisoprolol 5  MG tablet Commonly known as: ZEBETA Take 0.5 tablets by mouth daily.   buPROPion 150 MG 24 hr tablet Commonly known as: WELLBUTRIN XL Take 1 tablet (150 mg total) by mouth daily. What changed: how much to take   diclofenac sodium 1 % Gel Commonly known as: VOLTAREN Apply 2 g topically 4 (four) times daily.   fentaNYL 50 MCG/HR Commonly known as: Maupin 1 patch onto the skin every 3 (three) days.   ferrous sulfate 325 (65 FE) MG tablet Commonly known as: FerrouSul Take 1 tablet (325 mg total) by mouth 3 (three) times daily with meals for 14 days.   FLUoxetine 20 MG capsule Commonly known as: PROZAC Take 1 capsule (20 mg total) by mouth at bedtime.   gabapentin 100 MG capsule Commonly known as: NEURONTIN Take 100 mg by mouth 3 (three) times daily.   levothyroxine 88 MCG tablet Commonly known as: Synthroid Take 1 tablet (88 mcg total) by mouth daily before breakfast.   ondansetron 4 MG disintegrating tablet Commonly known as: Zofran ODT  Take 1 tablet (4 mg total) by mouth every 8 (eight) hours as needed for nausea or vomiting.   pantoprazole 40 MG tablet Commonly known as: PROTONIX Take 1 tablet (40 mg total) by mouth 2 (two) times daily.   polyethylene glycol 17 g packet Commonly known as: MiraLax Take 8 g by mouth daily. Increase to twice a day if no BM by day 2   spironolactone 25 MG tablet Commonly known as: ALDACTONE Take 1 tablet (25 mg total) by mouth daily.       Follow-up Information    Avva, Ravisankar, MD. Schedule an appointment as soon as possible for a visit in 1 week(s).   Specialty: Internal Medicine Contact information: [address] [place] [address] [phone]        Pain Management. Go to.   Why: Follow up with your pain management specialist as scheduled. Continue to wean down on medication as able.              Allergies  Allergen Reactions  . Penicillins     Causes rash Has patient had a PCN reaction  causing immediate rash, facial/tongue/throat swelling, SOB or lightheadedness with hypotension: YES Has patient had a PCN reaction causing severe rash involving mucus membranes or skin necrosis: No Has patient had a PCN reaction that required hospitalization No Has patient had a PCN reaction occurring within the last 10 years: No If all of the above answers are "NO", then may proceed with Cephalosporin use.     Consultations:  None    Procedures/Studies: CT Head Wo Contrast  Result Date: 05/27/2020 CLINICAL DATA:  Delirium confusion EXAM: CT HEAD WITHOUT CONTRAST TECHNIQUE: Contiguous axial images were obtained from the base of the skull through the vertex without intravenous contrast. COMPARISON:  CT brain 02/01/2019 FINDINGS: Brain: No acute territorial infarction, hemorrhage or intracranial mass. Mild atrophy. Mild chronic small vessel ischemic change of the white matter. Stable ventricle size Vascular: No hyperdense vessels.  Carotid vascular calcification Skull: Normal. Negative for fracture or focal lesion. Sinuses/Orbits: No acute finding. Other: None IMPRESSION: 1. No CT evidence for acute intracranial abnormality. 2. Atrophy and mild chronic small vessel ischemic changes of the white matter. Electronically Signed   By: Donavan Foil M.D.   On: 05/27/2020 16:15   CT LUMBAR SPINE W CONTRAST  Result Date: 05/28/2020 CLINICAL DATA:  Low back pain. Infection suspected. Patient states spine injection 3 days ago. EXAM: CT LUMBAR SPINE WITH CONTRAST TECHNIQUE: Multidetector CT imaging of the lumbar spine was performed with intravenous contrast administration. CONTRAST:  148mL OMNIPAQUE IOHEXOL 300 MG/ML  SOLN COMPARISON:  MRI lumbar spine 05/20/2019 FINDINGS: Segmentation: 5 non rib-bearing lumbar type vertebral bodies are present. The lowest fully formed vertebral body is L5. Alignment: Slight degenerative anterolisthesis is present at L4-5. Other significant listhesis is present. Extra convex  curvature is centered at L2-3. Vertebrae: Vertebral body heights are maintained. Degenerative endplate sclerotic changes are noted on the left at L2-3 and to a lesser degree on the right at L4-5 and L5-S1. Paraspinal and other soft tissues: Right kidney is not visualized, question nephrectomy. Atherosclerotic changes are noted in the aorta without aneurysm. Visualized abdomen is otherwise unremarkable. Disc levels: T12-L1: Negative. L1-2: Negative. L2-3: Disc protrusion is present. Asymmetric facet hypertrophy is present. Left foraminal narrowing is noted. L3-4: Solid fusion is present. Wide laminectomy is noted. No residual or recurrent stenosis is present. L4-5: A broad-based disc protrusion is present. Vacuum disc is noted. Moderate facet hypertrophy is noted  bilaterally. Central and foraminal narrowing is stable. L5-S1: Moderate facet hypertrophy is present, right greater than left. No significant stenosis is present. No pathologic enhancement is present. IMPRESSION: 1. Solid fusion and wide laminectomy at L3-4 without residual or recurrent stenosis. 2. 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 3. Left foraminal narrowing at L2-3 secondary to adjacent level disease leftward disc protrusion/endplate spurring. 4. Scoliosis is convex to the right at L2-3 and to the left at L5. 5. No enhancing epidural collections. MRI without and with contrast would be more sensitive for the evaluation of infection in the spine. 6. Aortic Atherosclerosis (ICD10-I70.0). Electronically Signed   By: San Morelle M.D.   On: 05/28/2020 18:53   MR BRAIN WO CONTRAST  Result Date: 05/27/2020 CLINICAL DATA:  Delirium. EXAM: MRI HEAD WITHOUT CONTRAST TECHNIQUE: Multiplanar, multiecho pulse sequences of the brain and surrounding structures were obtained without intravenous contrast. COMPARISON:  Head CT 05/27/2020 and MRI 04/14/2013 FINDINGS: Brain: There is no evidence of an acute infarct,  intracranial hemorrhage, mass, midline shift, or extra-axial fluid collection. There is mild cerebral atrophy. A few small foci of T2 hyperintensity in the cerebral white matter and pons are nonspecific but compatible with minimal chronic small vessel ischemic disease, also not considered abnormal for age. Vascular: Major intracranial vascular flow voids are preserved. Skull and upper cervical spine: No suspicious marrow lesion. Asymmetrically advanced left-sided facet arthrosis in the included upper cervical spine. Sinuses/Orbits: Bilateral cataract extraction. Paranasal sinuses and mastoid air cells are clear. Other: None. IMPRESSION: No acute intracranial abnormality. Electronically Signed   By: Logan Bores M.D.   On: 05/27/2020 20:41   DG Chest Portable 1 View  Result Date: 05/27/2020 CLINICAL DATA:  Altered mental status. EXAM: PORTABLE CHEST 1 VIEW COMPARISON:  02/08/2019. FINDINGS: Mediastinum and hilar structures normal. Stable cardiomegaly. Previously identified bilateral pulmonary infiltrates/edema have cleared. No focal infiltrate. Small left pleural effusion cannot be excluded. No pneumothorax. IMPRESSION: Stable cardiomegaly. Previously identified bilateral pulmonary infiltrates/edema on chest x-ray of 02/08/2019 have cleared. No focal infiltrate. Small left pleural effusion cannot be excluded. Electronically Signed   By: Marcello Moores  Register   On: 05/27/2020 15:39      Discharge Exam: Vitals:   05/30/20 2030 05/31/20 0510  BP: 118/65 (!) 152/98  Pulse: 67 (!) 58  Resp: 18 14  Temp: 98.4 F (36.9 C) 97.7 F (36.5 C)  SpO2: 97% 97%    General: Pt is alert, awake, not in acute distress Cardiovascular: Irreg rhythm, rate 70s, S1/S2 +, no edema Respiratory: CTA bilaterally, no wheezing, no rhonchi, no respiratory distress, no conversational dyspnea  Abdominal: Soft, NT, ND, bowel sounds + Extremities: no edema, no cyanosis Psych: Normal mood and affect, stable judgement and insight      The results of significant diagnostics from this hospitalization (including imaging, microbiology, ancillary and laboratory) are listed below for reference.     Microbiology: Recent Results (from the past 240 hour(s))  Culture, blood (routine x 2)     Status: None (Preliminary result)   Collection Time: 05/28/20  5:11 PM   Specimen: BLOOD LEFT HAND  Result Value Ref Range Status   Specimen Description   Final    BLOOD LEFT HAND Performed at [facility], [place] [address], [place], [place] [address]    Special Requests   Final    BOTTLES DRAWN AEROBIC AND ANAEROBIC Blood Culture adequate volume Performed at [place] [address], [place], [place] [address]  Culture   Final    NO GROWTH 3 DAYS Performed at [facility], [place] [address] [place], [place] [address]    Report Status PENDING  Incomplete  Culture, blood (routine x 2)     Status: None (Preliminary result)   Collection Time: 05/28/20  5:11 PM   Specimen: BLOOD RIGHT HAND  Result Value Ref Range Status   Specimen Description   Final    BLOOD RIGHT HAND Performed at [place] [address] [place], [place] [address]    Special Requests   Final    BOTTLES DRAWN AEROBIC AND ANAEROBIC Blood Culture adequate volume Performed at [place] [address] [place], [place] [address]    Culture   Final    NO GROWTH 3 DAYS Performed at [facility], [place] [address], [place], [place] [address]    Report Status PENDING  Incomplete  Urine culture     Status: Abnormal   Collection Time: 05/28/20  5:44 PM   Specimen: Urine, Random  Result Value Ref Range Status   Specimen Description   Final    URINE, RANDOM Performed at [facility] [address] [place], [place] [address]    Special Requests   Final    NONE Performed at [facility], [place] [address], [place], [place]  [address]    Culture (A)  Final    <10,000 COLONIES/mL INSIGNIFICANT GROWTH Performed at [facility] [address], [place], [place] [address]    Report Status 05/30/2020 FINAL  Final  Respiratory Panel by RT PCR (Flu A&B, Covid) - Nasopharyngeal Swab     Status: None   Collection Time: 05/28/20  8:01 PM   Specimen: Nasopharyngeal Swab  Result Value Ref Range Status   SARS Coronavirus 2 by RT PCR NEGATIVE NEGATIVE Final    Comment: (NOTE) SARS-CoV-2 target nucleic acids are NOT DETECTED.  The SARS-CoV-2 RNA is generally detectable in upper respiratoy specimens during the acute phase of infection. The lowest concentration of SARS-CoV-2 viral copies this assay can detect is 131 copies/mL. A negative result does not preclude SARS-Cov-2 infection and should not be used as the sole basis for treatment or other patient management decisions. A negative result may occur with  improper specimen collection/handling, submission of specimen other than nasopharyngeal swab, presence of viral mutation(s) within the areas targeted by this assay, and inadequate number of viral copies (<131 copies/mL). A negative result must be combined with clinical observations, patient history, and epidemiological information. The expected result is Negative.  Fact Sheet for Patients:  PinkCheek.be  Fact Sheet for Healthcare Providers:  GravelBags.it  This test is no t yet approved or cleared by the [place] FDA and  has been authorized for detection and/or diagnosis of SARS-CoV-2 by FDA under an Emergency Use Authorization (EUA). This EUA will remain  in effect (meaning this test can be used) for the duration of the COVID-19 declaration under Section 564(b)(1) of the Act, 21 U.S.C. section 360bbb-3(b)(1), unless the authorization is terminated or revoked sooner.     Influenza A by PCR NEGATIVE NEGATIVE Final   Influenza B by PCR NEGATIVE  NEGATIVE Final    Comment: (NOTE) The Xpert Xpress SARS-CoV-2/FLU/RSV assay is intended as an aid in  the diagnosis of influenza from Nasopharyngeal swab specimens and  should not be used as a sole basis for treatment. Nasal washings and  aspirates are unacceptable for Xpert Xpress  SARS-CoV-2/FLU/RSV  testing.  Fact Sheet for Patients: PinkCheek.be  Fact Sheet for Healthcare Providers: GravelBags.it  This test is not yet approved or cleared by the [place] FDA and  has been authorized for detection and/or diagnosis of SARS-CoV-2 by  FDA under an Emergency Use Authorization (EUA). This EUA will remain  in effect (meaning this test can be used) for the duration of the  Covid-19 declaration under Section 564(b)(1) of the Act, 21  U.S.C. section 360bbb-3(b)(1), unless the authorization is  terminated or revoked. Performed at [facility], [place] [address], [place], [place] [address]      Labs: BNP (last 3 results) No results for input(s): BNP in the last 8760 hours. Basic Metabolic Panel: Recent Labs  Lab 05/27/20 1505 05/28/20 1711 05/29/20 0601 05/30/20 0519 05/31/20 0535  NA 139 142 143 138 139  K 3.9 3.5 3.1* 3.8 3.6  CL 98 100 98 100 105  CO2 31 28 25 26 25   GLUCOSE 124* 100* 130* 123* 94  BUN 17 18 16 16 14   CREATININE 0.77 0.74 0.68 0.80 0.75  CALCIUM 9.9 10.0 10.5* 9.5 9.2  MG  --   --   --  2.0 1.7   Liver Function Tests: Recent Labs  Lab 05/27/20 1505 05/28/20 1711 05/29/20 0601  AST 16 17 17   ALT 10 9 10   ALKPHOS 89 88 89  BILITOT 1.1 1.3* 1.4*  PROT 7.9 7.7 7.8  ALBUMIN 4.5 4.5 4.6   No results for input(s): LIPASE, AMYLASE in the last 168 hours. Recent Labs  Lab 05/28/20 2112  AMMONIA 17   CBC: Recent Labs  Lab 05/27/20 1505 05/28/20 1711 05/29/20 0601 05/30/20 0519 05/31/20 0535  WBC 5.4 7.5 11.9* 10.4 8.1  NEUTROABS 4.2 5.8 10.0*  --   --   HGB 12.5  14.0 14.1 13.7 13.6  HCT 39.1 43.1 42.9 42.4 41.2  MCV 94.4 94.1 92.3 94.4 92.8  PLT 251 298 322 311 242   Cardiac Enzymes: No results for input(s): CKTOTAL, CKMB, CKMBINDEX, TROPONINI in the last 168 hours. BNP: Invalid input(s): POCBNP CBG: No results for input(s): GLUCAP in the last 168 hours. D-Dimer No results for input(s): DDIMER in the last 72 hours. Hgb A1c No results for input(s): HGBA1C in the last 72 hours. Lipid Profile No results for input(s): CHOL, HDL, LDLCALC, TRIG, CHOLHDL, LDLDIRECT in the last 72 hours. Thyroid function studies Recent Labs    05/29/20 0601  TSH 3.552   Anemia work up No results for input(s): VITAMINB12, FOLATE, FERRITIN, TIBC, IRON, RETICCTPCT in the last 72 hours. Urinalysis    Component Value Date/Time   COLORURINE STRAW (A) 05/28/2020 1744   APPEARANCEUR CLEAR 05/28/2020 1744   LABSPEC 1.008 05/28/2020 1744   PHURINE 8.0 05/28/2020 1744   GLUCOSEU NEGATIVE 05/28/2020 1744   HGBUR SMALL (A) 05/28/2020 1744   BILIRUBINUR NEGATIVE 05/28/2020 1744   KETONESUR NEGATIVE 05/28/2020 1744   PROTEINUR NEGATIVE 05/28/2020 1744   NITRITE NEGATIVE 05/28/2020 1744   LEUKOCYTESUR TRACE (A) 05/28/2020 1744   Sepsis Labs Invalid input(s): PROCALCITONIN,  WBC,  LACTICIDVEN Microbiology Recent Results (from the past 240 hour(s))  Culture, blood (routine x 2)     Status: None (Preliminary result)   Collection Time: 05/28/20  5:11 PM   Specimen: BLOOD LEFT HAND  Result Value Ref Range Status   Specimen Description   Final    BLOOD LEFT HAND Performed at [facility], [place] [address], [name], [place] [address]  Special Requests   Final    BOTTLES DRAWN AEROBIC AND ANAEROBIC Blood Culture adequate volume Performed at [place] [address], [place], [place] [address]    Culture   Final    NO GROWTH 3 DAYS Performed at [facility], [place] [address], [place], [place] [address]    Report Status  PENDING  Incomplete  Culture, blood (routine x 2)     Status: None (Preliminary result)   Collection Time: 05/28/20  5:11 PM   Specimen: BLOOD RIGHT HAND  Result Value Ref Range Status   Specimen Description   Final    BLOOD RIGHT HAND Performed at [facility] [address], [place], [place] [address]    Special Requests   Final    BOTTLES DRAWN AEROBIC AND ANAEROBIC Blood Culture adequate volume Performed at [place] [address] [place], [place] [address]    Culture   Final    NO GROWTH 3 DAYS Performed at [facility], [place] [address], [place], [place] [address]    Report Status PENDING  Incomplete  Urine culture     Status: Abnormal   Collection Time: 05/28/20  5:44 PM   Specimen: Urine, Random  Result Value Ref Range Status   Specimen Description   Final    URINE, RANDOM Performed at [place] [address], [place], [place] [address]    Special Requests   Final    NONE Performed at [facility], [place] [address], [place], [place] [address]    Culture (A)  Final    <10,000 COLONIES/mL INSIGNIFICANT GROWTH Performed at [place] [address], [place], [place] [address]    Report Status 05/30/2020 FINAL  Final  Respiratory Panel by RT PCR (Flu A&B, Covid) - Nasopharyngeal Swab     Status: None   Collection Time: 05/28/20  8:01 PM   Specimen: Nasopharyngeal Swab  Result Value Ref Range Status   SARS Coronavirus 2 by RT PCR NEGATIVE NEGATIVE Final    Comment: (NOTE) SARS-CoV-2 target nucleic acids are NOT DETECTED.  The SARS-CoV-2 RNA is generally detectable in upper respiratoy specimens during the acute phase of infection. The lowest concentration of SARS-CoV-2 viral copies this assay can detect is 131 copies/mL. A negative result does not preclude SARS-Cov-2 infection and should not be used as the sole basis for treatment or other patient management decisions.  A negative result may occur with  improper specimen collection/handling, submission of specimen other than nasopharyngeal swab, presence of viral mutation(s) within the areas targeted by this assay, and inadequate number of viral copies (<131 copies/mL). A negative result must be combined with clinical observations, patient history, and epidemiological information. The expected result is Negative.  Fact Sheet for Patients:  PinkCheek.be  Fact Sheet for Healthcare Providers:  GravelBags.it  This test is no t yet approved or cleared by the [place] FDA and  has been authorized for detection and/or diagnosis of SARS-CoV-2 by FDA under an Emergency Use Authorization (EUA). This EUA will remain  in effect (meaning this test can be used) for the duration of the COVID-19 declaration under Section 564(b)(1) of the Act, 21 U.S.C. section 360bbb-3(b)(1), unless the authorization is terminated or revoked sooner.     Influenza A by PCR NEGATIVE NEGATIVE Final   Influenza B by PCR NEGATIVE NEGATIVE Final    Comment: (NOTE) The Xpert Xpress SARS-CoV-2/FLU/RSV assay is intended as an  aid in  the diagnosis of influenza from Nasopharyngeal swab specimens and  should not be used as a sole basis for treatment. Nasal washings and  aspirates are unacceptable for Xpert Xpress SARS-CoV-2/FLU/RSV  testing.  Fact Sheet for Patients: PinkCheek.be  Fact Sheet for Healthcare Providers: GravelBags.it  This test is not yet approved or cleared by the [place] FDA and  has been authorized for detection and/or diagnosis of SARS-CoV-2 by  FDA under an Emergency Use Authorization (EUA). This EUA will remain  in effect (meaning this test can be used) for the duration of the  Covid-19 declaration under Section 564(b)(1) of the Act, 21  U.S.C. section 360bbb-3(b)(1), unless the  authorization is  terminated or revoked. Performed at [facility], [place] [address] [place], [place] [address]      Patient was seen and examined on the day of discharge and was found to be in stable condition. Time coordinating discharge: 25 minutes including assessment and coordination of care, as well as examination of the patient.   SIGNED:  Dessa Phi, DO [facility] 05/31/2020, 9:35 AM

## 2020-06-01 ENCOUNTER — Ambulatory Visit: Payer: Medicare Other | Admitting: Physical Therapy

## 2020-06-02 LAB — CULTURE, BLOOD (ROUTINE X 2)
Culture: NO GROWTH
Culture: NO GROWTH
Special Requests: ADEQUATE
Special Requests: ADEQUATE

## 2020-06-03 ENCOUNTER — Ambulatory Visit: Payer: Medicare Other | Admitting: Physical Therapy

## 2020-06-03 DIAGNOSIS — M5136 Other intervertebral disc degeneration, lumbar region: Secondary | ICD-10-CM | POA: Diagnosis not present

## 2020-06-03 DIAGNOSIS — M4035 Flatback syndrome, thoracolumbar region: Secondary | ICD-10-CM | POA: Diagnosis not present

## 2020-06-03 DIAGNOSIS — M47816 Spondylosis without myelopathy or radiculopathy, lumbar region: Secondary | ICD-10-CM | POA: Diagnosis not present
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
## 2020-06-07 DIAGNOSIS — H53453 Other localized visual field defect, bilateral: Secondary | ICD-10-CM | POA: Diagnosis not present

## 2020-06-07 DIAGNOSIS — H469 Unspecified optic neuritis: Secondary | ICD-10-CM | POA: Diagnosis not present

## 2020-06-07 DIAGNOSIS — H47293 Other optic atrophy, bilateral: Secondary | ICD-10-CM | POA: Diagnosis not present

## 2020-06-08 ENCOUNTER — Ambulatory Visit: Payer: Medicare Other | Admitting: Physical Therapy

## 2020-06-09 ENCOUNTER — Other Ambulatory Visit: Payer: Self-pay

## 2020-06-09 ENCOUNTER — Telehealth: Payer: Self-pay

## 2020-06-09 ENCOUNTER — Ambulatory Visit (INDEPENDENT_AMBULATORY_CARE_PROVIDER_SITE_OTHER): Payer: Medicare Other | Admitting: Cardiology

## 2020-06-09 ENCOUNTER — Encounter: Payer: Self-pay | Admitting: Cardiology

## 2020-06-09 VITALS — BP 107/65 | HR 91 | Ht 67.0 in | Wt 118.0 lb

## 2020-06-09 DIAGNOSIS — I482 Chronic atrial fibrillation, unspecified: Secondary | ICD-10-CM

## 2020-06-09 DIAGNOSIS — I5181 Takotsubo syndrome: Secondary | ICD-10-CM | POA: Diagnosis not present

## 2020-06-09 DIAGNOSIS — R600 Localized edema: Secondary | ICD-10-CM

## 2020-06-09 DIAGNOSIS — E785 Hyperlipidemia, unspecified: Secondary | ICD-10-CM | POA: Diagnosis not present

## 2020-06-09 DIAGNOSIS — I10 Essential (primary) hypertension: Secondary | ICD-10-CM

## 2020-06-09 MED ORDER — APIXABAN 5 MG PO TABS: 5.0000 mg | ORAL_TABLET | Freq: Two times a day (BID) | ORAL | 3 refills | Status: DC

## 2020-06-09 NOTE — Telephone Encounter (Signed)
Patient assistance form for Eliquis completed and faxed to [employer] at fax # [phone].

## 2020-06-10 ENCOUNTER — Ambulatory Visit: Payer: Medicare Other | Admitting: Physical Therapy

## 2020-06-10 DIAGNOSIS — R112 Nausea with vomiting, unspecified: Secondary | ICD-10-CM | POA: Diagnosis not present

## 2020-06-10 DIAGNOSIS — R140 Abdominal distension (gaseous): Secondary | ICD-10-CM | POA: Diagnosis not present

## 2020-06-10 DIAGNOSIS — K5900 Constipation, unspecified: Secondary | ICD-10-CM | POA: Diagnosis not present

## 2020-06-10 DIAGNOSIS — R6881 Early satiety: Secondary | ICD-10-CM | POA: Diagnosis not present

## 2020-06-10 DIAGNOSIS — R143 Flatulence: Secondary | ICD-10-CM | POA: Diagnosis not present

## 2020-06-14 DIAGNOSIS — R112 Nausea with vomiting, unspecified: Secondary | ICD-10-CM | POA: Diagnosis not present

## 2020-06-18 DIAGNOSIS — S42021A Displaced fracture of shaft of right clavicle, initial encounter for closed fracture: Secondary | ICD-10-CM | POA: Diagnosis not present

## 2020-06-23 DIAGNOSIS — E785 Hyperlipidemia, unspecified: Secondary | ICD-10-CM | POA: Diagnosis not present

## 2020-06-23 DIAGNOSIS — E039 Hypothyroidism, unspecified: Secondary | ICD-10-CM | POA: Diagnosis not present

## 2020-06-23 DIAGNOSIS — E559 Vitamin D deficiency, unspecified: Secondary | ICD-10-CM | POA: Diagnosis not present

## 2020-06-28 DIAGNOSIS — R82998 Other abnormal findings in urine: Secondary | ICD-10-CM | POA: Diagnosis not present

## 2020-06-30 DIAGNOSIS — M4035 Flatback syndrome, thoracolumbar region: Secondary | ICD-10-CM | POA: Diagnosis not present

## 2020-06-30 DIAGNOSIS — M47816 Spondylosis without myelopathy or radiculopathy, lumbar region: Secondary | ICD-10-CM | POA: Diagnosis not present

## 2020-06-30 DIAGNOSIS — M5136 Other intervertebral disc degeneration, lumbar region: Secondary | ICD-10-CM | POA: Diagnosis not present

## 2020-07-01 ENCOUNTER — Telehealth: Payer: Self-pay

## 2020-07-01 NOTE — Telephone Encounter (Signed)
Patient assistance forms for Eliquis completed and refaxed to [employer] patient assistance # [phone].

## 2020-07-02 DIAGNOSIS — H547 Unspecified visual loss: Secondary | ICD-10-CM | POA: Diagnosis not present

## 2020-07-06 DIAGNOSIS — Z23 Encounter for immunization: Secondary | ICD-10-CM | POA: Diagnosis not present

## 2020-07-14 ENCOUNTER — Telehealth: Payer: Self-pay | Admitting: Cardiology

## 2020-07-14 NOTE — Telephone Encounter (Signed)
Patient wanted to speak with Malachy Mood about her Eliquis. Please call back

## 2020-07-20 NOTE — Telephone Encounter (Signed)
Spoke to patient she stated she called [employer] patient assistance to see if Eliquis was approved.Stated she was told Dr.Jordan's signature was missing.Advised I faxed all completed paper work to [employer] patient assistance with Dr.Jordan's signature.All paperwork refaxed to fax # [phone].

## 2020-07-26 NOTE — Telephone Encounter (Signed)
Spoke to patient she stated she has 2 more days of Eliquis left.Advised I am out of office today.I will check for samples and call her back.Also still waiting on patient assistance approval.

## 2020-07-28 DIAGNOSIS — M5136 Other intervertebral disc degeneration, lumbar region: Secondary | ICD-10-CM | POA: Diagnosis not present

## 2020-07-28 DIAGNOSIS — M4035 Flatback syndrome, thoracolumbar region: Secondary | ICD-10-CM | POA: Diagnosis not present

## 2020-07-28 DIAGNOSIS — M47816 Spondylosis without myelopathy or radiculopathy, lumbar region: Secondary | ICD-10-CM | POA: Diagnosis not present

## 2020-07-28 NOTE — Telephone Encounter (Signed)
Spoke to patient Eliquis 5 mg samples left at [facility] office front desk. 

## 2020-08-25 DIAGNOSIS — M5136 Other intervertebral disc degeneration, lumbar region: Secondary | ICD-10-CM | POA: Diagnosis not present

## 2020-08-25 DIAGNOSIS — Z6822 Body mass index (BMI) 22.0-22.9, adult: Secondary | ICD-10-CM | POA: Diagnosis not present

## 2020-08-25 DIAGNOSIS — M4035 Flatback syndrome, thoracolumbar region: Secondary | ICD-10-CM | POA: Diagnosis not present

## 2020-08-25 DIAGNOSIS — M47816 Spondylosis without myelopathy or radiculopathy, lumbar region: Secondary | ICD-10-CM | POA: Diagnosis not present

## 2020-08-25 DIAGNOSIS — Z79891 Long term (current) use of opiate analgesic: Secondary | ICD-10-CM | POA: Diagnosis not present

## 2020-08-25 DIAGNOSIS — Z79899 Other long term (current) drug therapy: Secondary | ICD-10-CM | POA: Diagnosis not present

## 2020-08-25 DIAGNOSIS — G894 Chronic pain syndrome: Secondary | ICD-10-CM | POA: Diagnosis not present

## 2020-09-30 DIAGNOSIS — M5136 Other intervertebral disc degeneration, lumbar region: Secondary | ICD-10-CM | POA: Diagnosis not present

## 2020-09-30 DIAGNOSIS — Z6822 Body mass index (BMI) 22.0-22.9, adult: Secondary | ICD-10-CM | POA: Diagnosis not present

## 2020-09-30 DIAGNOSIS — M47816 Spondylosis without myelopathy or radiculopathy, lumbar region: Secondary | ICD-10-CM | POA: Diagnosis not present

## 2020-09-30 DIAGNOSIS — M4035 Flatback syndrome, thoracolumbar region: Secondary | ICD-10-CM | POA: Diagnosis not present

## 2020-10-01 DIAGNOSIS — H53453 Other localized visual field defect, bilateral: Secondary | ICD-10-CM | POA: Diagnosis not present

## 2020-10-01 DIAGNOSIS — H469 Unspecified optic neuritis: Secondary | ICD-10-CM | POA: Diagnosis not present

## 2020-10-01 DIAGNOSIS — H5111 Convergence insufficiency: Secondary | ICD-10-CM | POA: Diagnosis not present

## 2020-10-01 DIAGNOSIS — H47293 Other optic atrophy, bilateral: Secondary | ICD-10-CM | POA: Diagnosis not present

## 2020-11-01 ENCOUNTER — Telehealth: Payer: Self-pay

## 2020-11-01 ENCOUNTER — Other Ambulatory Visit: Payer: Self-pay

## 2020-11-01 MED ORDER — APIXABAN 5 MG PO TABS: 5.0000 mg | ORAL_TABLET | Freq: Two times a day (BID) | ORAL | 3 refills | Status: DC

## 2020-11-01 NOTE — Telephone Encounter (Signed)
Spoke to [employer] patient assistance was told Dr.Jordan's portion of application was on a out dated form.New form completed and faxed with case # [id number] to fax # [phone].

## 2020-11-11 DIAGNOSIS — M5416 Radiculopathy, lumbar region: Secondary | ICD-10-CM | POA: Diagnosis not present

## 2020-11-11 DIAGNOSIS — K219 Gastro-esophageal reflux disease without esophagitis: Secondary | ICD-10-CM | POA: Diagnosis not present

## 2020-11-11 DIAGNOSIS — I129 Hypertensive chronic kidney disease with stage 1 through stage 4 chronic kidney disease, or unspecified chronic kidney disease: Secondary | ICD-10-CM | POA: Diagnosis not present

## 2020-11-11 DIAGNOSIS — F1120 Opioid dependence, uncomplicated: Secondary | ICD-10-CM | POA: Diagnosis not present

## 2020-11-11 DIAGNOSIS — F3340 Major depressive disorder, recurrent, in remission, unspecified: Secondary | ICD-10-CM | POA: Diagnosis not present

## 2020-11-11 DIAGNOSIS — E785 Hyperlipidemia, unspecified: Secondary | ICD-10-CM | POA: Diagnosis not present

## 2020-11-11 DIAGNOSIS — N1831 Chronic kidney disease, stage 3a: Secondary | ICD-10-CM | POA: Diagnosis not present

## 2020-11-26 DIAGNOSIS — I129 Hypertensive chronic kidney disease with stage 1 through stage 4 chronic kidney disease, or unspecified chronic kidney disease: Secondary | ICD-10-CM | POA: Diagnosis not present

## 2020-11-26 DIAGNOSIS — M5412 Radiculopathy, cervical region: Secondary | ICD-10-CM | POA: Diagnosis not present

## 2020-11-26 DIAGNOSIS — M5416 Radiculopathy, lumbar region: Secondary | ICD-10-CM | POA: Diagnosis not present

## 2020-11-26 DIAGNOSIS — N1831 Chronic kidney disease, stage 3a: Secondary | ICD-10-CM | POA: Diagnosis not present

## 2020-11-26 DIAGNOSIS — G609 Hereditary and idiopathic neuropathy, unspecified: Secondary | ICD-10-CM | POA: Diagnosis not present

## 2020-11-26 DIAGNOSIS — R059 Cough, unspecified: Secondary | ICD-10-CM | POA: Diagnosis not present

## 2020-11-26 DIAGNOSIS — J069 Acute upper respiratory infection, unspecified: Secondary | ICD-10-CM | POA: Diagnosis not present

## 2020-11-26 DIAGNOSIS — R2689 Other abnormalities of gait and mobility: Secondary | ICD-10-CM | POA: Diagnosis not present

## 2020-11-30 ENCOUNTER — Telehealth: Payer: Self-pay | Admitting: Cardiology

## 2020-11-30 NOTE — Telephone Encounter (Signed)
4.12.22 Was speaking to her to schedule fu/Dr Martinique. She would like you to call her regarding her [facility] request for assistance. She had gotten a letter from them, Thanks [employer]

## 2020-11-30 NOTE — Telephone Encounter (Signed)
Spoke to patient she stated she received a letter from Eliquis patient assistance requesting all of her out of pocket expenses.Stated she will mail what is needed.

## 2020-12-10 DIAGNOSIS — H40003 Preglaucoma, unspecified, bilateral: Secondary | ICD-10-CM | POA: Diagnosis not present

## 2020-12-10 DIAGNOSIS — H47293 Other optic atrophy, bilateral: Secondary | ICD-10-CM | POA: Diagnosis not present

## 2020-12-16 DIAGNOSIS — M4035 Flatback syndrome, thoracolumbar region: Secondary | ICD-10-CM | POA: Diagnosis not present

## 2020-12-16 DIAGNOSIS — M47816 Spondylosis without myelopathy or radiculopathy, lumbar region: Secondary | ICD-10-CM | POA: Diagnosis not present

## 2020-12-16 DIAGNOSIS — M5136 Other intervertebral disc degeneration, lumbar region: Secondary | ICD-10-CM | POA: Diagnosis not present

## 2021-01-04 ENCOUNTER — Ambulatory Visit: Payer: Medicare HMO | Attending: Rehabilitation | Admitting: Physical Therapy

## 2021-01-04 ENCOUNTER — Encounter: Payer: Self-pay | Admitting: Physical Therapy

## 2021-01-04 ENCOUNTER — Other Ambulatory Visit: Payer: Self-pay

## 2021-01-04 DIAGNOSIS — M62830 Muscle spasm of back: Secondary | ICD-10-CM | POA: Insufficient documentation

## 2021-01-04 DIAGNOSIS — M25561 Pain in right knee: Secondary | ICD-10-CM | POA: Diagnosis not present

## 2021-01-04 DIAGNOSIS — M6281 Muscle weakness (generalized): Secondary | ICD-10-CM | POA: Insufficient documentation

## 2021-01-04 DIAGNOSIS — R293 Abnormal posture: Secondary | ICD-10-CM | POA: Insufficient documentation

## 2021-01-04 DIAGNOSIS — M545 Low back pain, unspecified: Secondary | ICD-10-CM | POA: Diagnosis not present

## 2021-01-04 DIAGNOSIS — R262 Difficulty in walking, not elsewhere classified: Secondary | ICD-10-CM | POA: Insufficient documentation

## 2021-01-04 DIAGNOSIS — G8929 Other chronic pain: Secondary | ICD-10-CM | POA: Insufficient documentation
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
## 2021-01-07 DIAGNOSIS — H53453 Other localized visual field defect, bilateral: Secondary | ICD-10-CM | POA: Diagnosis not present

## 2021-01-07 DIAGNOSIS — H469 Unspecified optic neuritis: Secondary | ICD-10-CM | POA: Diagnosis not present

## 2021-01-07 DIAGNOSIS — H47293 Other optic atrophy, bilateral: Secondary | ICD-10-CM | POA: Diagnosis not present

## 2021-01-11 ENCOUNTER — Other Ambulatory Visit: Payer: Self-pay

## 2021-01-11 ENCOUNTER — Ambulatory Visit: Payer: Medicare HMO | Admitting: Physical Therapy

## 2021-01-11 DIAGNOSIS — M62830 Muscle spasm of back: Secondary | ICD-10-CM | POA: Diagnosis not present

## 2021-01-11 DIAGNOSIS — R262 Difficulty in walking, not elsewhere classified: Secondary | ICD-10-CM | POA: Diagnosis not present

## 2021-01-11 DIAGNOSIS — M545 Low back pain, unspecified: Secondary | ICD-10-CM | POA: Diagnosis not present

## 2021-01-11 DIAGNOSIS — G8929 Other chronic pain: Secondary | ICD-10-CM

## 2021-01-11 DIAGNOSIS — M25561 Pain in right knee: Secondary | ICD-10-CM | POA: Diagnosis not present

## 2021-01-11 DIAGNOSIS — M6281 Muscle weakness (generalized): Secondary | ICD-10-CM | POA: Diagnosis not present

## 2021-01-11 DIAGNOSIS — R293 Abnormal posture: Secondary | ICD-10-CM | POA: Diagnosis not present
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
## 2021-01-13 ENCOUNTER — Other Ambulatory Visit: Payer: Self-pay

## 2021-01-13 ENCOUNTER — Ambulatory Visit: Payer: Medicare HMO | Admitting: Physical Therapy

## 2021-01-13 DIAGNOSIS — M6281 Muscle weakness (generalized): Secondary | ICD-10-CM | POA: Diagnosis not present

## 2021-01-13 DIAGNOSIS — R293 Abnormal posture: Secondary | ICD-10-CM | POA: Diagnosis not present

## 2021-01-13 DIAGNOSIS — M25561 Pain in right knee: Secondary | ICD-10-CM

## 2021-01-13 DIAGNOSIS — M62830 Muscle spasm of back: Secondary | ICD-10-CM | POA: Diagnosis not present

## 2021-01-13 DIAGNOSIS — R262 Difficulty in walking, not elsewhere classified: Secondary | ICD-10-CM

## 2021-01-13 DIAGNOSIS — G8929 Other chronic pain: Secondary | ICD-10-CM | POA: Diagnosis not present

## 2021-01-13 DIAGNOSIS — M545 Low back pain, unspecified: Secondary | ICD-10-CM | POA: Diagnosis not present
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
## 2021-01-19 DIAGNOSIS — G894 Chronic pain syndrome: Secondary | ICD-10-CM | POA: Diagnosis not present

## 2021-01-19 DIAGNOSIS — Z79891 Long term (current) use of opiate analgesic: Secondary | ICD-10-CM | POA: Diagnosis not present

## 2021-01-19 DIAGNOSIS — M4035 Flatback syndrome, thoracolumbar region: Secondary | ICD-10-CM | POA: Diagnosis not present

## 2021-01-19 DIAGNOSIS — M5136 Other intervertebral disc degeneration, lumbar region: Secondary | ICD-10-CM | POA: Diagnosis not present

## 2021-01-19 DIAGNOSIS — Z79899 Other long term (current) drug therapy: Secondary | ICD-10-CM | POA: Diagnosis not present

## 2021-01-19 DIAGNOSIS — M47816 Spondylosis without myelopathy or radiculopathy, lumbar region: Secondary | ICD-10-CM | POA: Diagnosis not present

## 2021-01-25 ENCOUNTER — Ambulatory Visit: Payer: Medicare HMO | Admitting: Physical Therapy

## 2021-01-27 ENCOUNTER — Ambulatory Visit: Payer: Medicare HMO | Admitting: Physical Therapy

## 2021-01-31 DIAGNOSIS — M25561 Pain in right knee: Secondary | ICD-10-CM | POA: Diagnosis not present

## 2021-02-01 ENCOUNTER — Encounter: Payer: Self-pay | Admitting: Physical Therapy

## 2021-02-01 ENCOUNTER — Ambulatory Visit: Payer: Medicare HMO | Attending: Rehabilitation | Admitting: Physical Therapy

## 2021-02-01 ENCOUNTER — Other Ambulatory Visit: Payer: Self-pay

## 2021-02-01 DIAGNOSIS — M545 Low back pain, unspecified: Secondary | ICD-10-CM | POA: Insufficient documentation

## 2021-02-01 DIAGNOSIS — R262 Difficulty in walking, not elsewhere classified: Secondary | ICD-10-CM

## 2021-02-01 DIAGNOSIS — R293 Abnormal posture: Secondary | ICD-10-CM | POA: Diagnosis not present

## 2021-02-01 DIAGNOSIS — M62830 Muscle spasm of back: Secondary | ICD-10-CM

## 2021-02-01 DIAGNOSIS — R296 Repeated falls: Secondary | ICD-10-CM | POA: Diagnosis not present

## 2021-02-01 DIAGNOSIS — M25562 Pain in left knee: Secondary | ICD-10-CM | POA: Diagnosis not present

## 2021-02-01 DIAGNOSIS — M25561 Pain in right knee: Secondary | ICD-10-CM | POA: Insufficient documentation

## 2021-02-01 DIAGNOSIS — M6281 Muscle weakness (generalized): Secondary | ICD-10-CM

## 2021-02-01 DIAGNOSIS — G8929 Other chronic pain: Secondary | ICD-10-CM | POA: Diagnosis not present
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
## 2021-02-03 ENCOUNTER — Ambulatory Visit: Payer: Medicare HMO | Admitting: Physical Therapy

## 2021-02-09 DIAGNOSIS — M25561 Pain in right knee: Secondary | ICD-10-CM | POA: Diagnosis not present

## 2021-02-10 ENCOUNTER — Ambulatory Visit: Payer: Medicare HMO | Admitting: Physical Therapy

## 2021-02-15 ENCOUNTER — Ambulatory Visit: Payer: Medicare HMO | Admitting: Physical Therapy

## 2021-02-17 ENCOUNTER — Ambulatory Visit: Payer: Medicare HMO | Admitting: Physical Therapy

## 2021-02-24 DIAGNOSIS — M47816 Spondylosis without myelopathy or radiculopathy, lumbar region: Secondary | ICD-10-CM | POA: Diagnosis not present

## 2021-02-24 DIAGNOSIS — M5136 Other intervertebral disc degeneration, lumbar region: Secondary | ICD-10-CM | POA: Diagnosis not present

## 2021-02-24 DIAGNOSIS — M4035 Flatback syndrome, thoracolumbar region: Secondary | ICD-10-CM | POA: Diagnosis not present

## 2021-03-05 DIAGNOSIS — M25561 Pain in right knee: Secondary | ICD-10-CM | POA: Diagnosis not present

## 2021-03-18 ENCOUNTER — Telehealth: Payer: Self-pay | Admitting: Cardiology
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
## 2021-03-18 NOTE — Telephone Encounter (Signed)
Patient states she has been playing phone tag with Malachy Mood. She would like Malachy Mood to call her as soon as she can

## 2021-03-18 NOTE — Telephone Encounter (Signed)
Returned call to pt she states that [name] has been working her on her Pt assistance. I will put samples at the front desk and forward message to [facility] for further information

## 2021-03-21 MED ORDER — APIXABAN 5 MG PO TABS: 5.0000 mg | ORAL_TABLET | Freq: Two times a day (BID) | ORAL | 3 refills | Status: DC

## 2021-03-21 NOTE — Telephone Encounter (Signed)
Spoke to patient advised I never heard back from patient assistance for Eliquis.Advised I will mail a new application for her to complete and bring back along with proof of her income.

## 2021-03-28 ENCOUNTER — Encounter: Payer: Self-pay | Admitting: Cardiology

## 2021-03-28 ENCOUNTER — Other Ambulatory Visit: Payer: Self-pay

## 2021-03-28 ENCOUNTER — Ambulatory Visit: Payer: Medicare HMO | Admitting: Cardiology

## 2021-03-28 VITALS — BP 103/62 | HR 81 | Resp 18 | Ht 66.0 in | Wt 129.4 lb

## 2021-03-28 DIAGNOSIS — M25561 Pain in right knee: Secondary | ICD-10-CM | POA: Diagnosis not present

## 2021-03-28 DIAGNOSIS — E785 Hyperlipidemia, unspecified: Secondary | ICD-10-CM

## 2021-03-28 DIAGNOSIS — I482 Chronic atrial fibrillation, unspecified: Secondary | ICD-10-CM

## 2021-03-28 DIAGNOSIS — I10 Essential (primary) hypertension: Secondary | ICD-10-CM | POA: Diagnosis not present

## 2021-03-28 DIAGNOSIS — I5181 Takotsubo syndrome: Secondary | ICD-10-CM

## 2021-03-30 DIAGNOSIS — N3000 Acute cystitis without hematuria: Secondary | ICD-10-CM | POA: Diagnosis not present

## 2021-03-30 DIAGNOSIS — R3912 Poor urinary stream: Secondary | ICD-10-CM | POA: Diagnosis not present

## 2021-04-14 DIAGNOSIS — M25561 Pain in right knee: Secondary | ICD-10-CM | POA: Diagnosis not present

## 2021-04-21 DIAGNOSIS — I722 Aneurysm of renal artery: Secondary | ICD-10-CM

## 2021-04-21 DIAGNOSIS — M25561 Pain in right knee: Secondary | ICD-10-CM | POA: Diagnosis not present

## 2021-04-21 HISTORY — DX: Aneurysm of renal artery: I72.2

## 2021-04-29 ENCOUNTER — Encounter (HOSPITAL_COMMUNITY): Payer: Self-pay

## 2021-04-29 ENCOUNTER — Emergency Department (HOSPITAL_COMMUNITY): Payer: Medicare HMO

## 2021-04-29 ENCOUNTER — Emergency Department (HOSPITAL_COMMUNITY)
Admission: EM | Admit: 2021-04-29 | Discharge: 2021-04-29 | Disposition: A | Discharge status: Home / Self Care | Payer: Medicare HMO | Attending: Emergency Medicine | Admitting: Emergency Medicine

## 2021-04-29 ENCOUNTER — Other Ambulatory Visit: Payer: Self-pay

## 2021-04-29 DIAGNOSIS — I129 Hypertensive chronic kidney disease with stage 1 through stage 4 chronic kidney disease, or unspecified chronic kidney disease: Secondary | ICD-10-CM | POA: Diagnosis not present

## 2021-04-29 DIAGNOSIS — Z7901 Long term (current) use of anticoagulants: Secondary | ICD-10-CM | POA: Diagnosis not present

## 2021-04-29 DIAGNOSIS — R059 Cough, unspecified: Secondary | ICD-10-CM | POA: Diagnosis not present

## 2021-04-29 DIAGNOSIS — R109 Unspecified abdominal pain: Secondary | ICD-10-CM | POA: Diagnosis not present

## 2021-04-29 DIAGNOSIS — Z87891 Personal history of nicotine dependence: Secondary | ICD-10-CM | POA: Insufficient documentation

## 2021-04-29 DIAGNOSIS — N189 Chronic kidney disease, unspecified: Secondary | ICD-10-CM | POA: Insufficient documentation

## 2021-04-29 DIAGNOSIS — R197 Diarrhea, unspecified: Secondary | ICD-10-CM | POA: Diagnosis not present

## 2021-04-29 DIAGNOSIS — R112 Nausea with vomiting, unspecified: Secondary | ICD-10-CM | POA: Diagnosis not present

## 2021-04-29 DIAGNOSIS — N390 Urinary tract infection, site not specified: Secondary | ICD-10-CM | POA: Diagnosis not present

## 2021-04-29 DIAGNOSIS — J449 Chronic obstructive pulmonary disease, unspecified: Secondary | ICD-10-CM | POA: Insufficient documentation

## 2021-04-29 DIAGNOSIS — R000 Tachycardia, unspecified: Secondary | ICD-10-CM | POA: Diagnosis not present

## 2021-04-29 DIAGNOSIS — Z79899 Other long term (current) drug therapy: Secondary | ICD-10-CM | POA: Diagnosis not present

## 2021-04-29 DIAGNOSIS — E039 Hypothyroidism, unspecified: Secondary | ICD-10-CM | POA: Diagnosis not present

## 2021-04-29 DIAGNOSIS — K760 Fatty (change of) liver, not elsewhere classified: Secondary | ICD-10-CM | POA: Diagnosis not present

## 2021-04-29 DIAGNOSIS — R1084 Generalized abdominal pain: Secondary | ICD-10-CM | POA: Insufficient documentation

## 2021-04-29 DIAGNOSIS — Z96641 Presence of right artificial hip joint: Secondary | ICD-10-CM | POA: Insufficient documentation

## 2021-04-29 DIAGNOSIS — J441 Chronic obstructive pulmonary disease with (acute) exacerbation: Secondary | ICD-10-CM | POA: Diagnosis not present

## 2021-04-29 DIAGNOSIS — M5136 Other intervertebral disc degeneration, lumbar region: Secondary | ICD-10-CM | POA: Diagnosis not present

## 2021-04-29 DIAGNOSIS — R1110 Vomiting, unspecified: Secondary | ICD-10-CM | POA: Diagnosis not present

## 2021-04-29 HISTORY — DX: Disorder of kidney and ureter, unspecified: N28.9

## 2021-04-29 LAB — URINALYSIS, ROUTINE W REFLEX MICROSCOPIC
Bacteria, UA: NONE SEEN
Glucose, UA: NEGATIVE mg/dL
Ketones, ur: 40 mg/dL — AB
Nitrite: NEGATIVE
Specific Gravity, Urine: 1.015 (ref 1.005–1.030)
WBC, UA: >50 WBC/hpf — ABNORMAL HIGH (ref 0–5)
pH: 6.5 (ref 5.0–8.0)

## 2021-04-29 LAB — CBC
HCT: 38.1 % (ref 36.0–46.0)
Hemoglobin: 12.3 g/dL (ref 12.0–15.0)
MCH: 28.9 pg (ref 26.0–34.0)
MCHC: 32.3 g/dL (ref 30.0–36.0)
MCV: 89.6 fL (ref 80.0–100.0)
Platelets: 313 10*3/uL (ref 150–400)
RBC: 4.25 MIL/uL (ref 3.87–5.11)
RDW: 13.2 % (ref 11.5–15.5)
WBC: 7.7 10*3/uL (ref 4.0–10.5)
nRBC: 0 % (ref 0.0–0.2)

## 2021-04-29 LAB — COMPREHENSIVE METABOLIC PANEL WITH GFR
ALT: 13 U/L (ref 0–44)
AST: 20 U/L (ref 15–41)
Albumin: 4.1 g/dL (ref 3.5–5.0)
Alkaline Phosphatase: 76 U/L (ref 38–126)
Anion gap: 10 (ref 5–15)
BUN: 20 mg/dL (ref 8–23)
CO2: 27 mmol/L (ref 22–32)
Calcium: 10.3 mg/dL (ref 8.9–10.3)
Chloride: 104 mmol/L (ref 98–111)
Creatinine, Ser: 1.07 mg/dL — ABNORMAL HIGH (ref 0.44–1.00)
GFR, Estimated: 54 mL/min — ABNORMAL LOW
Glucose, Bld: 113 mg/dL — ABNORMAL HIGH (ref 70–99)
Potassium: 3.8 mmol/L (ref 3.5–5.1)
Sodium: 141 mmol/L (ref 135–145)
Total Bilirubin: 1.6 mg/dL — ABNORMAL HIGH (ref 0.3–1.2)
Total Protein: 7.4 g/dL (ref 6.5–8.1)

## 2021-04-29 LAB — LIPASE, BLOOD: Lipase: 27 U/L (ref 11–51)

## 2021-04-29 MED ORDER — METOCLOPRAMIDE HCL 5 MG/ML IJ SOLN
10.0000 mg | Freq: Once | INTRAMUSCULAR | Status: AC
  Administered 2021-04-29: 10 mg via INTRAVENOUS
  Filled 2021-04-29: qty 2

## 2021-04-29 MED ORDER — CEPHALEXIN 500 MG PO CAPS: 500.0000 mg | ORAL_CAPSULE | Freq: Four times a day (QID) | ORAL | 0 refills | Status: DC

## 2021-04-29 MED ORDER — LACTATED RINGERS IV BOLUS
1000.0000 mL | Freq: Once | INTRAVENOUS | Status: AC
  Administered 2021-04-29: 1000 mL via INTRAVENOUS

## 2021-04-29 MED ORDER — DOXYCYCLINE HYCLATE 100 MG PO CAPS: 100.0000 mg | ORAL_CAPSULE | Freq: Two times a day (BID) | ORAL | 0 refills | Status: DC

## 2021-04-29 MED ORDER — CEPHALEXIN 500 MG PO CAPS
500.0000 mg | ORAL_CAPSULE | Freq: Once | ORAL | Status: AC
  Administered 2021-04-29: 500 mg via ORAL
  Filled 2021-04-29: qty 1

## 2021-04-29 MED ORDER — IOHEXOL 350 MG/ML SOLN
80.0000 mL | Freq: Once | INTRAVENOUS | Status: AC | PRN
  Administered 2021-04-29: 60 mL via INTRAVENOUS

## 2021-04-29 MED ORDER — DOXYCYCLINE HYCLATE 100 MG PO TABS
100.0000 mg | ORAL_TABLET | Freq: Once | ORAL | Status: AC
  Administered 2021-04-29: 100 mg via ORAL
  Filled 2021-04-29: qty 1

## 2021-04-29 NOTE — Discharge Instructions (Addendum)
Please see the findings below and follow-up with your primary care doctor for this.    Interval development of a 20 mm rounded noncalcified mass within the  basilar right middle lobe, possibly post infectious or post  inflammatory in nature. This, however, is indeterminate. Consider  one of the following in 3 months for both low-risk and high-risk  individuals: (a) repeat chest CT, (b) follow-up PET-CT, or (c)  tissue sampling. This recommendation follows the consensus  statement: Guidelines for Management of Incidental Pulmonary Nodules  Detected on CT Images: From the Fleischner Society 2017; Radiology  2017; 284:228-243.

## 2021-04-29 NOTE — ED Triage Notes (Signed)
Pt arrived via POV, diarrhea x3 days. Poor oral intake.

## 2021-04-29 NOTE — ED Provider Notes (Signed)
Patient signed to me by Dr. Maryan Rued pending results of CT scan which shows no signs of intra-abdominal process but does have findings concerning for lung neoplasm.  These findings were discussed at length with patient and patient's daughter.  Urinalysis continues to show greater than 50 white blood cells however no evidence of bacteria but patient is symptomatic with dysuria.  Will place on Keflex here and urine culture sent.  Possible infection in patient's pulmonary noted and patient placed on doxycycline for this.   Katie Leigh, MD 04/29/21 2025
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
## 2021-04-29 NOTE — ED Notes (Signed)
Pt d/c home per MD order .Discharge summary reviewed with pt and pt husband, verbalize understanding. Off unit via WC, no s/s of acute distress noted. Discharged home with husband.

## 2021-04-29 NOTE — ED Notes (Signed)
Pt transported to CT ?

## 2021-05-01 LAB — URINE CULTURE

## 2021-05-19 ENCOUNTER — Telehealth: Payer: Self-pay | Admitting: Cardiology

## 2021-05-19 DIAGNOSIS — N3000 Acute cystitis without hematuria: Secondary | ICD-10-CM | POA: Diagnosis not present

## 2021-05-19 NOTE — Telephone Encounter (Signed)
Pt c/o medication issue:  1. Name of Medication: Eliquis   2. How are you currently taking this medication (dosage and times per day)? BID  3. Are you having a reaction (difficulty breathing--STAT)? No   4. What is your medication issue? Assistants

## 2021-05-20 NOTE — Telephone Encounter (Signed)
Spoke to patient Eliquis 5 mg samples left at front desk.She will bring patient assistance forms to be faxed.

## 2021-05-26 ENCOUNTER — Telehealth: Payer: Self-pay | Admitting: Cardiology

## 2021-05-26 DIAGNOSIS — M5136 Other intervertebral disc degeneration, lumbar region: Secondary | ICD-10-CM | POA: Diagnosis not present

## 2021-05-26 NOTE — Telephone Encounter (Signed)
Pt c/o swelling: STAT is pt has developed SOB within 24 hours  If swelling, where is the swelling located? Feet and Face  How much weight have you gained and in what time span? Not sure doesn't have a scale   Have you gained 3 pounds in a day or 5 pounds in a week? Not sure doesn't have a scale   Do you have a log of your daily weights (if so, list)? no  Are you currently taking a fluid pill? yes  Are you currently SOB? no  Have you traveled recently? no

## 2021-05-26 NOTE — Telephone Encounter (Signed)
Returned call to patient's daughter Annetta left Dr.Jordan's advice on personal voice mail.Advised to call back if needed.

## 2021-05-26 NOTE — Telephone Encounter (Signed)
Spoke to patient's daughter Steva Ready.She stated she is concerned about mother stated she has been having swelling in face and lower legs for the past 2 days.No sob,She has not weighed.She wanted to ask if Dr.Jordan if he would prescribe Lasix.She also stated her mother has been getting confused,seeing things.Advised she will need to see PCP.Stated she is waiting for PCP to call her back.I will send message to [place] for advice.

## 2021-05-26 NOTE — Telephone Encounter (Signed)
She is fairly frail and I don't feel comfortable prescribing lasix for her without some evaluation. The facial swelling would be unusual with volume overload. I would start with PCP evaluation.  Colbey Wirtanen Martinique MD, [facility]

## 2021-05-27 DIAGNOSIS — I4891 Unspecified atrial fibrillation: Secondary | ICD-10-CM | POA: Diagnosis not present

## 2021-05-27 DIAGNOSIS — R300 Dysuria: Secondary | ICD-10-CM | POA: Diagnosis not present

## 2021-05-27 DIAGNOSIS — F22 Delusional disorders: Secondary | ICD-10-CM | POA: Diagnosis not present

## 2021-05-27 DIAGNOSIS — R600 Localized edema: Secondary | ICD-10-CM | POA: Diagnosis not present

## 2021-05-27 DIAGNOSIS — F1120 Opioid dependence, uncomplicated: Secondary | ICD-10-CM | POA: Diagnosis not present

## 2021-05-27 DIAGNOSIS — F418 Other specified anxiety disorders: Secondary | ICD-10-CM | POA: Diagnosis not present

## 2021-05-31 ENCOUNTER — Other Ambulatory Visit: Payer: Self-pay

## 2021-05-31 MED ORDER — APIXABAN 5 MG PO TABS
5.0000 mg | ORAL_TABLET | Freq: Two times a day (BID) | ORAL | 1 refills | Status: DC
Start: 2021-05-31 — End: 2021-09-29

## 2021-05-31 NOTE — Telephone Encounter (Signed)
Prescription refill request for Eliquis received. Indication:afib Last office visit: Martinique 03/28/21 Scr: 1.07 04/29/21 Age: 75f Weight:58.7kg

## 2021-06-01 DIAGNOSIS — G894 Chronic pain syndrome: Secondary | ICD-10-CM | POA: Diagnosis not present

## 2021-06-01 DIAGNOSIS — Z79891 Long term (current) use of opiate analgesic: Secondary | ICD-10-CM | POA: Diagnosis not present

## 2021-06-01 DIAGNOSIS — M5136 Other intervertebral disc degeneration, lumbar region: Secondary | ICD-10-CM | POA: Diagnosis not present

## 2021-06-01 DIAGNOSIS — Z79899 Other long term (current) drug therapy: Secondary | ICD-10-CM | POA: Diagnosis not present

## 2021-06-03 DIAGNOSIS — M1990 Unspecified osteoarthritis, unspecified site: Secondary | ICD-10-CM | POA: Diagnosis not present

## 2021-06-03 DIAGNOSIS — R2681 Unsteadiness on feet: Secondary | ICD-10-CM | POA: Diagnosis not present

## 2021-06-03 DIAGNOSIS — R2689 Other abnormalities of gait and mobility: Secondary | ICD-10-CM | POA: Diagnosis not present
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
## 2021-06-09 ENCOUNTER — Ambulatory Visit: Payer: Medicare HMO | Admitting: Physician Assistant

## 2021-06-09 ENCOUNTER — Encounter: Payer: Self-pay | Admitting: Physician Assistant

## 2021-06-09 ENCOUNTER — Other Ambulatory Visit: Payer: Self-pay

## 2021-06-09 VITALS — BP 124/72 | HR 79 | Ht 66.0 in | Wt 120.6 lb

## 2021-06-09 DIAGNOSIS — I482 Chronic atrial fibrillation, unspecified: Secondary | ICD-10-CM

## 2021-06-09 DIAGNOSIS — I5181 Takotsubo syndrome: Secondary | ICD-10-CM

## 2021-06-09 DIAGNOSIS — E785 Hyperlipidemia, unspecified: Secondary | ICD-10-CM | POA: Diagnosis not present

## 2021-06-09 DIAGNOSIS — I10 Essential (primary) hypertension: Secondary | ICD-10-CM

## 2021-06-09 DIAGNOSIS — I5031 Acute diastolic (congestive) heart failure: Secondary | ICD-10-CM

## 2021-06-09 MED ORDER — APIXABAN 5 MG PO TABS: 5.0000 mg | ORAL_TABLET | Freq: Two times a day (BID) | ORAL | 0 refills | Status: DC

## 2021-06-09 MED ORDER — FUROSEMIDE 20 MG PO TABS: 20.0000 mg | ORAL_TABLET | ORAL | 2 refills | Status: DC | PRN

## 2021-06-09 NOTE — Patient Instructions (Signed)
Medication Instructions:  Start Lasix 20 mg ( Take 1 Tablet Daily as Needed). *If you need a refill on your cardiac medications before your next appointment, please call your pharmacy*   Lab Work: No Labs If you have labs (blood work) drawn today and your tests are completely normal, you will receive your results only by: [place] (if you have MyChart) OR A paper copy in the mail If you have any lab test that is abnormal or we need to change your treatment, we will call you to review the results.   Testing/Procedures: [address]. Your physician has requested that you have an echocardiogram. Echocardiography is a painless test that uses sound waves to create images of your heart. It provides your doctor with information about the size and shape of your heart and how well your heart's chambers and valves are working. This procedure takes approximately one hour. There are no restrictions for this procedure.    Follow-Up: At [facility], you and your health needs are our priority.  As part of our continuing mission to provide you with exceptional heart care, we have created designated Provider Care Teams.  These Care Teams include your primary Cardiologist (physician) and Advanced Practice Providers (APPs -  Physician Assistants and Nurse Practitioners) who all work together to provide you with the care you need, when you need it.  We recommend signing up for the patient portal called "MyChart".  Sign up information is provided on this After Visit Summary.  MyChart is used to connect with patients for Virtual Visits (Telemedicine).  Patients are able to view lab/test results, encounter notes, upcoming appointments, etc.  Non-urgent messages can be sent to your provider as well.   To learn more about what you can do with MyChart, go to [email or web address].    Your next appointment:   3 month(s)  The format for your next appointment:   In  Person  Provider:   Caron Presume, PA-C   Other Instructions Recommended Compression Stockings. Heart Failure Education:  Weigh yourself EVERY morning after you go to the bathroom but before you eat or drink anything. Write this number down in a weight log/diary. If you gain 3 pounds overnight or 5 pounds in a week, call the office. Take your medicines as prescribed. If you have concerns about your medications, please call us before you stop taking them. Eat low salt foods--Limit salt (sodium) to 2000 mg per day. This will help prevent your body from holding onto fluid. Read food labels as many processed foods have a lot of sodium, especially canned goods and prepackaged meats. If you would like some assistance choosing low sodium foods, we would be happy to set you up with a nutritionist. Limit all fluids for the day to less than 2 liters (64 ounces). Fluid includes all drinks, coffee, juice, ice chips, soup, jello, and all other liquids. Stay as active as you can everyday. Staying active will give you more energy and make your muscles stronger. Start with 5 minutes at a time and work your way up to 30 minutes a day. Break up your activities--do some in the morning and some in the afternoon. Start with 3 days per week and work your way up to 5 days as you can.  If you have chest pain, feel short of breath, dizzy, or lightheaded, STOP. If you don't feel better after a short rest, call 911. If you do feel better, call the office to let us  know you have symptoms with exercise.

## 2021-06-13 ENCOUNTER — Other Ambulatory Visit: Payer: Self-pay | Admitting: *Deleted

## 2021-06-29 DIAGNOSIS — E785 Hyperlipidemia, unspecified: Secondary | ICD-10-CM | POA: Diagnosis not present

## 2021-07-05 ENCOUNTER — Ambulatory Visit (HOSPITAL_COMMUNITY): Payer: Medicare HMO

## 2021-07-05 DIAGNOSIS — E785 Hyperlipidemia, unspecified: Secondary | ICD-10-CM | POA: Diagnosis not present

## 2021-07-05 DIAGNOSIS — E039 Hypothyroidism, unspecified: Secondary | ICD-10-CM | POA: Diagnosis not present

## 2021-07-05 DIAGNOSIS — I10 Essential (primary) hypertension: Secondary | ICD-10-CM | POA: Diagnosis not present

## 2021-07-11 ENCOUNTER — Ambulatory Visit (HOSPITAL_COMMUNITY): Payer: Medicare HMO | Attending: Physician Assistant

## 2021-07-12 ENCOUNTER — Encounter (HOSPITAL_COMMUNITY): Payer: Self-pay | Admitting: Physician Assistant

## 2021-07-18 DIAGNOSIS — I4891 Unspecified atrial fibrillation: Secondary | ICD-10-CM | POA: Diagnosis not present

## 2021-07-18 DIAGNOSIS — M8580 Other specified disorders of bone density and structure, unspecified site: Secondary | ICD-10-CM | POA: Diagnosis not present

## 2021-07-18 DIAGNOSIS — N1831 Chronic kidney disease, stage 3a: Secondary | ICD-10-CM | POA: Diagnosis not present

## 2021-07-18 DIAGNOSIS — G609 Hereditary and idiopathic neuropathy, unspecified: Secondary | ICD-10-CM | POA: Diagnosis not present

## 2021-07-18 DIAGNOSIS — D649 Anemia, unspecified: Secondary | ICD-10-CM | POA: Diagnosis not present

## 2021-07-18 DIAGNOSIS — Z0000 Encounter for general adult medical examination without abnormal findings: Secondary | ICD-10-CM | POA: Diagnosis not present

## 2021-07-18 DIAGNOSIS — R8281 Pyuria: Secondary | ICD-10-CM | POA: Diagnosis not present

## 2021-07-18 DIAGNOSIS — I129 Hypertensive chronic kidney disease with stage 1 through stage 4 chronic kidney disease, or unspecified chronic kidney disease: Secondary | ICD-10-CM | POA: Diagnosis not present

## 2021-07-18 DIAGNOSIS — E039 Hypothyroidism, unspecified: Secondary | ICD-10-CM | POA: Diagnosis not present

## 2021-07-18 DIAGNOSIS — E785 Hyperlipidemia, unspecified: Secondary | ICD-10-CM | POA: Diagnosis not present

## 2021-07-18 DIAGNOSIS — R7989 Other specified abnormal findings of blood chemistry: Secondary | ICD-10-CM | POA: Diagnosis not present

## 2021-07-18 DIAGNOSIS — N390 Urinary tract infection, site not specified: Secondary | ICD-10-CM | POA: Diagnosis not present

## 2021-07-18 DIAGNOSIS — I872 Venous insufficiency (chronic) (peripheral): Secondary | ICD-10-CM | POA: Diagnosis not present

## 2021-07-18 DIAGNOSIS — N23 Unspecified renal colic: Secondary | ICD-10-CM | POA: Diagnosis not present

## 2021-07-18 DIAGNOSIS — F1120 Opioid dependence, uncomplicated: Secondary | ICD-10-CM | POA: Diagnosis not present

## 2021-07-18 DIAGNOSIS — F418 Other specified anxiety disorders: Secondary | ICD-10-CM | POA: Diagnosis not present

## 2021-07-19 ENCOUNTER — Encounter: Payer: Self-pay | Admitting: *Deleted

## 2021-07-21 DIAGNOSIS — M5136 Other intervertebral disc degeneration, lumbar region: Secondary | ICD-10-CM | POA: Diagnosis not present

## 2021-07-22 ENCOUNTER — Other Ambulatory Visit (HOSPITAL_COMMUNITY): Payer: Medicare HMO

## 2021-08-17 ENCOUNTER — Encounter: Payer: Self-pay | Admitting: *Deleted

## 2021-08-26 ENCOUNTER — Encounter (HOSPITAL_COMMUNITY): Payer: Self-pay | Admitting: Radiology

## 2021-09-01 DIAGNOSIS — M5136 Other intervertebral disc degeneration, lumbar region: Secondary | ICD-10-CM | POA: Diagnosis not present

## 2021-09-08 ENCOUNTER — Encounter: Payer: Self-pay | Admitting: *Deleted

## 2021-09-13 ENCOUNTER — Ambulatory Visit: Payer: Medicare HMO | Admitting: Diagnostic Neuroimaging
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
## 2021-09-14 ENCOUNTER — Ambulatory Visit: Payer: Medicare HMO | Admitting: Physician Assistant

## 2021-09-22 DIAGNOSIS — M5136 Other intervertebral disc degeneration, lumbar region: Secondary | ICD-10-CM | POA: Diagnosis not present

## 2021-09-22 DIAGNOSIS — N390 Urinary tract infection, site not specified: Secondary | ICD-10-CM | POA: Diagnosis not present

## 2021-09-29 ENCOUNTER — Encounter: Payer: Self-pay | Admitting: Internal Medicine

## 2021-09-29 ENCOUNTER — Ambulatory Visit (INDEPENDENT_AMBULATORY_CARE_PROVIDER_SITE_OTHER): Payer: Medicare HMO | Admitting: Internal Medicine

## 2021-09-29 VITALS — BP 110/70 | HR 84 | Ht 65.0 in | Wt 126.0 lb

## 2021-09-29 DIAGNOSIS — R1319 Other dysphagia: Secondary | ICD-10-CM | POA: Diagnosis not present

## 2021-09-29 DIAGNOSIS — K219 Gastro-esophageal reflux disease without esophagitis: Secondary | ICD-10-CM

## 2021-09-29 DIAGNOSIS — R110 Nausea: Secondary | ICD-10-CM

## 2021-09-29 NOTE — Patient Instructions (Addendum)
You will be contacted by [facility] in the next 2 days to arrange a Barium Swallow Test.  The number on your caller ID will be [phone], please answer when they call.  If you have not heard from them in 2 days please call [phone] to schedule.     Continue Pantoprazole as directed.   If you are age 77 or older, your body mass index should be between 23-30. Your Body mass index is 20.97 kg/m. If this is out of the aforementioned range listed, please consider follow up with your Primary Care Provider.  If you are age 39 or younger, your body mass index should be between 19-25. Your Body mass index is 20.97 kg/m. If this is out of the aformentioned range listed, please consider follow up with your Primary Care Provider.   ________________________________________________________  The [facility] providers would like to encourage you to use [facility] to communicate with providers for non-urgent requests or questions.  Due to long hold times on the telephone, sending your provider a message by [facility] may be a faster and more efficient way to get a response.  Please allow 48 business hours for a response.  Please remember that this is for non-urgent requests.  _______________________________________________________  Thank you for choosing me and [facility].  Dr. Ulice Dash Pyrtle

## 2021-09-29 NOTE — Progress Notes (Signed)
Subjective:    Patient ID: [name], female    DOB: [birth year], 76 y.o.   MRN: [id number]  HPI Devita Nies is a 76 year old female with a history of GERD, hiatal hernia with Schatzki's ring, prior history of gastric adenoma, history of PUD, pancreas divisum, history of chronic constipation with overflow loose stool, chronic nausea who is here for follow-up.  She is here today with her husband and I last saw her for office visit in June 2021.  Since her last visit she was evaluated at [facility]  The plan was for her to see Dr. Derrill Kay for chronic nausea and intermittent vomiting though she ended up seeing another provider.  Some work-up was done.  SIBO breath testing was recommended but never completed.  Her last EGD and colonoscopy were performed on 10/22/2019 EGD showed a tortuous esophagus, 1 to 2 cm hiatal hernia with partial Schatzki's ring which was nonobstructing.  Mild diffuse erythema in the stomach which was biopsied.  No gastric polyps were found.  The examined duodenum was normal.  Pathology showed slight chronic inflammation without H. Pylori. Colonoscopy was normal.  She reports that for the most part she is doing well.  Her biggest complaint is dysphagia.  She reports that food gets hung up with swallowing nearly every meal.  Can happen with liquids and solids but is more frequent with solids.  Seems to happen with meats and breads.  Chicken seems to be easier for her to eat than beef or pork.  Her heartburn is well controlled with pantoprazole 40 mg twice daily.  Overall her nausea is significantly better though 3 days/week on average she uses ondansetron which helps.  It has been quite sometime since she has had any vomiting.  Bowel movements vary and are inconsistent and irregular.  She has not had blood in stool or melena.  She has not really used MiraLAX because she was not finding it to be very helpful.  At times she will have lower abdominal cramping after bowel movement  which seems to resolve if she lies down for half hour or so.  Review of Systems As per HPI, otherwise negative  Current Medications, Allergies, Past Medical History, Past Surgical History, Family History and Social History were reviewed in [employer] record.    Objective:   Physical Exam BP 110/70    Pulse 84    Ht 5\' 5"  (1.651 m)    Wt 126 lb (57.2 kg)    BMI 20.97 kg/m  Gen: awake, alert, NAD HEENT: anicteric CV: RRR, 2/6 sem Pulm: CTA b/l Abd: soft, NT/ND, +BS throughout Ext: no c/c/e Neuro: nonfocal  CT ABDOMEN AND PELVIS WITH CONTRAST   TECHNIQUE: Multidetector CT imaging of the abdomen and pelvis was performed using the standard protocol following bolus administration of intravenous contrast.   CONTRAST:  74mL OMNIPAQUE IOHEXOL 350 MG/ML SOLN   COMPARISON:  02/11/2019   FINDINGS: Lower chest: 13 x 14 x 21 mm subpleural rounded mass is developed at the basilar right middle lobe, possibly post infectious or inflammatory in nature, but not well characterized on this examination. There is scattered bibasilar ground-glass pulmonary infiltrate within the visualized lower lobes, possibly related to acute infection or inflammation. These are not fully assessed on this examination. The visualized lung bases appear hyperinflated in keeping with changes of underlying COPD. Cardiac size is mildly enlarged with particular enlargement of the a right heart chambers. No pericardial effusion.   Hepatobiliary: Cholecystectomy has been  performed. Mild intrahepatic and moderate extrahepatic biliary ductal dilation appears stable since prior examination and likely reflects post cholecystectomy change. Multiple scattered simple cysts are seen within the liver. A stable partially calcified mass is again seen within the left hepatic lobe, of unclear significance, possibly the sequela of remote trauma or ablated intervention. 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 Mild hepatic steatosis.   Pancreas: Dilation of the pancreatic duct is again noted diffusely, stable since prior examination. Pancreatic divisum again noted. No peripancreatic inflammatory changes are identified. No intrapancreatic masses are seen.   Spleen: Unremarkable   Adrenals/Urinary Tract: The adrenal glands are unremarkable. Status post right nephrectomy. The residual left kidney is unremarkable. The bladder is partially obscured by streak artifact from right hip arthroplasty but is otherwise unremarkable.   Stomach/Bowel: The stomach, small bowel, and large bowel are unremarkable. The appendix is not clearly identified, however, there are no secondary signs of appendicitis within the right lower quadrant. No free intraperitoneal gas or fluid.   Vascular/Lymphatic: There is extensive aortoiliac atherosclerotic calcification. No aortic aneurysm. 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   Reproductive: Status post hysterectomy. No adnexal masses.   Other: No abdominal wall hernia.  Rectum unremarkable.   Musculoskeletal: Left total hip arthroplasty has been performed. L3-4 lumbar fusion with instrumentation with solid incorporation of interbody bone graft and resection of the spinous process of L4 is noted. Degenerative changes are seen within the lumbar spine. No acute bone abnormality.   IMPRESSION: No acute intra-abdominal pathology identified. No definite radiographic explanation for the patient's reported symptoms.   Bibasilar pulmonary infiltrates, nonspecific, possibly infectious or inflammatory. Superimposed COPD.   Interval development of a 20 mm rounded noncalcified mass within the basilar right middle lobe, possibly post infectious or post inflammatory in nature. This, however, is indeterminate. Consider one of the following in 3 months for both low-risk and  high-risk individuals: (a) repeat chest CT, (b) follow-up PET-CT, or (c) tissue sampling. This recommendation follows the consensus statement: Guidelines for Management of Incidental Pulmonary Nodules Detected on CT Images: From the Fleischner Society 2017; Radiology 2017; 284:228-243.   Stable biliary ductal dilation, likely representing post cholecystectomy change.   Stable partially calcified mass within the left hepatic lobe, indeterminate but likely benign given its stability over time. This may reflect the sequela of prior trauma, ablated therapy, or infection.   Pancreatic divisum. Stable dilation of the pancreatic duct. No peripancreatic inflammatory change identified.   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     Electronically Signed   By: Fidela Salisbury M.D.   On: 04/29/2021 19:37       Assessment & Plan:  76 year old female with a history of GERD, hiatal hernia with Schatzki's ring, prior history of gastric adenoma, history of PUD, pancreas divisum, history of chronic constipation with overflow loose stool, chronic nausea who is here for follow-up.   Chronic nausea --nausea is doing about as well as it has in the last several years.  She does use ondansetron 3 days/week with success.  For now we will continue symptomatic therapy and not plan additional evaluation --See #2 --Ondansetron 4 mg every 8 hours as needed  2.  GERD/dysphagia/partial Schatzki's ring --she does have what sounds like daily trouble with solid food dysphagia.  This could relate to dysmotility or her Schatzki's ring.  We discussed that upper endoscopy can be considered but would carry risk  given the need to interrupt anticoagulant therapy.  We discussed this together today and I recommended we perform objective barium esophagram before consideration of repeat EGD. --Continue pantoprazole 40 mg twice daily --Esophagram with tablet  3.  Intermittent  constipation and history of overflow diarrhea --irregular bowel movements but she is not feeling terribly constipated of late.  Diarrhea has not been an issue. --No new therapy for this at this time; she did not find MiraLAX overly helpful  4.  CRC screening --last colonoscopy normal, would not need repeat based on age  61 minutes total spent today including patient facing time, coordination of care, reviewing medical history/procedures/pertinent radiology studies, and documentation of the encounter.
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
## 2021-11-02 ENCOUNTER — Encounter: Payer: Self-pay | Admitting: Physician Assistant

## 2021-11-02 ENCOUNTER — Other Ambulatory Visit: Payer: Self-pay

## 2021-11-02 ENCOUNTER — Ambulatory Visit: Payer: Medicare HMO | Admitting: Physician Assistant

## 2021-11-02 VITALS — BP 122/76 | HR 82 | Ht 66.0 in | Wt 130.0 lb

## 2021-11-02 DIAGNOSIS — I10 Essential (primary) hypertension: Secondary | ICD-10-CM | POA: Diagnosis not present

## 2021-11-02 DIAGNOSIS — I5181 Takotsubo syndrome: Secondary | ICD-10-CM | POA: Diagnosis not present

## 2021-11-02 DIAGNOSIS — I482 Chronic atrial fibrillation, unspecified: Secondary | ICD-10-CM | POA: Diagnosis not present

## 2021-11-02 DIAGNOSIS — E785 Hyperlipidemia, unspecified: Secondary | ICD-10-CM | POA: Diagnosis not present

## 2021-11-02 DIAGNOSIS — I5032 Chronic diastolic (congestive) heart failure: Secondary | ICD-10-CM

## 2021-11-02 NOTE — Patient Instructions (Signed)
Medication Instructions:  ?No Changes ?*If you need a refill on your cardiac medications before your next appointment, please call your pharmacy* ? ? ?Lab Work: ?No Labs ?If you have labs (blood work) drawn today and your tests are completely normal, you will receive your results only by: ?MyChart Message (if you have MyChart) OR ?A paper copy in the mail ?If you have any lab test that is abnormal or we need to change your treatment, we will call you to review the results. ? ? ?Testing/Procedures: ?No Testing ? ? ?Follow-Up: ?At [facility], you and your health needs are our priority.  As part of our continuing mission to provide you with exceptional heart care, we have created designated Provider Care Teams.  These Care Teams include your primary Cardiologist (physician) and Advanced Practice Providers (APPs -  Physician Assistants and Nurse Practitioners) who all work together to provide you with the care you need, when you need it. ? ?We recommend signing up for the patient portal called "MyChart".  Sign up information is provided on this After Visit Summary.  MyChart is used to connect with patients for Virtual Visits (Telemedicine).  Patients are able to view lab/test results, encounter notes, upcoming appointments, etc.  Non-urgent messages can be sent to your provider as well.   ?To learn more about what you can do with MyChart, go to [email or web address].   ? ?Your next appointment:   ?6 month(s) ? ?The format for your next appointment:   ?In Person ? ?Provider:   ?Peter Martinique, MD   ? ? ?  ?

## 2021-11-04 ENCOUNTER — Other Ambulatory Visit: Payer: Self-pay

## 2021-11-04 ENCOUNTER — Ambulatory Visit (HOSPITAL_COMMUNITY)
Admission: RE | Admit: 2021-11-04 | Discharge: 2021-11-04 | Disposition: A | Discharge status: Home / Self Care | Payer: Medicare HMO | Source: Ambulatory Visit | Attending: Internal Medicine | Admitting: Internal Medicine

## 2021-11-04 DIAGNOSIS — R1319 Other dysphagia: Secondary | ICD-10-CM | POA: Insufficient documentation

## 2021-11-04 DIAGNOSIS — K224 Dyskinesia of esophagus: Secondary | ICD-10-CM | POA: Diagnosis not present

## 2021-11-04 DIAGNOSIS — R110 Nausea: Secondary | ICD-10-CM | POA: Insufficient documentation

## 2021-11-04 DIAGNOSIS — R112 Nausea with vomiting, unspecified: Secondary | ICD-10-CM | POA: Diagnosis not present

## 2021-11-04 DIAGNOSIS — K219 Gastro-esophageal reflux disease without esophagitis: Secondary | ICD-10-CM | POA: Diagnosis not present

## 2021-11-21 DIAGNOSIS — M5136 Other intervertebral disc degeneration, lumbar region: Secondary | ICD-10-CM | POA: Diagnosis not present

## 2021-11-22 DIAGNOSIS — F418 Other specified anxiety disorders: Secondary | ICD-10-CM | POA: Diagnosis not present

## 2021-11-22 DIAGNOSIS — N1831 Chronic kidney disease, stage 3a: Secondary | ICD-10-CM | POA: Diagnosis not present

## 2021-11-22 DIAGNOSIS — F1120 Opioid dependence, uncomplicated: Secondary | ICD-10-CM | POA: Diagnosis not present

## 2021-11-22 DIAGNOSIS — I872 Venous insufficiency (chronic) (peripheral): Secondary | ICD-10-CM | POA: Diagnosis not present

## 2021-11-22 DIAGNOSIS — K219 Gastro-esophageal reflux disease without esophagitis: Secondary | ICD-10-CM | POA: Diagnosis not present

## 2021-11-22 DIAGNOSIS — I4891 Unspecified atrial fibrillation: Secondary | ICD-10-CM | POA: Diagnosis not present

## 2021-11-22 DIAGNOSIS — G609 Hereditary and idiopathic neuropathy, unspecified: Secondary | ICD-10-CM | POA: Diagnosis not present

## 2021-11-22 DIAGNOSIS — D649 Anemia, unspecified: Secondary | ICD-10-CM | POA: Diagnosis not present

## 2021-11-22 DIAGNOSIS — I129 Hypertensive chronic kidney disease with stage 1 through stage 4 chronic kidney disease, or unspecified chronic kidney disease: Secondary | ICD-10-CM | POA: Diagnosis not present

## 2021-12-05 DIAGNOSIS — N3000 Acute cystitis without hematuria: Secondary | ICD-10-CM | POA: Diagnosis not present

## 2021-12-05 DIAGNOSIS — R8271 Bacteriuria: Secondary | ICD-10-CM | POA: Diagnosis not present

## 2021-12-13 ENCOUNTER — Ambulatory Visit: Payer: Medicare HMO | Admitting: Orthopaedic Surgery

## 2021-12-13 ENCOUNTER — Telehealth: Payer: Self-pay | Admitting: Orthopaedic Surgery
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
## 2021-12-13 NOTE — Telephone Encounter (Signed)
Called and worked in 

## 2021-12-14 ENCOUNTER — Ambulatory Visit (INDEPENDENT_AMBULATORY_CARE_PROVIDER_SITE_OTHER): Payer: Medicare HMO

## 2021-12-14 ENCOUNTER — Ambulatory Visit (INDEPENDENT_AMBULATORY_CARE_PROVIDER_SITE_OTHER): Payer: Medicare HMO | Admitting: Orthopaedic Surgery

## 2021-12-14 ENCOUNTER — Ambulatory Visit: Payer: Self-pay

## 2021-12-14 DIAGNOSIS — M1712 Unilateral primary osteoarthritis, left knee: Secondary | ICD-10-CM

## 2021-12-14 DIAGNOSIS — M170 Bilateral primary osteoarthritis of knee: Secondary | ICD-10-CM | POA: Diagnosis not present

## 2021-12-14 DIAGNOSIS — M25561 Pain in right knee: Secondary | ICD-10-CM

## 2021-12-14 DIAGNOSIS — M1711 Unilateral primary osteoarthritis, right knee: Secondary | ICD-10-CM

## 2021-12-14 DIAGNOSIS — G8929 Other chronic pain: Secondary | ICD-10-CM

## 2021-12-14 DIAGNOSIS — M25562 Pain in left knee: Secondary | ICD-10-CM

## 2021-12-14 MED ORDER — METHYLPREDNISOLONE ACETATE 40 MG/ML IJ SUSP
40.0000 mg | INTRAMUSCULAR | Status: AC | PRN
  Administered 2021-12-14: 40 mg via INTRA_ARTICULAR

## 2021-12-14 MED ORDER — LIDOCAINE HCL 1 % IJ SOLN
3.0000 mL | INTRAMUSCULAR | Status: AC | PRN
  Administered 2021-12-14: 3 mL
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
## 2021-12-19 DIAGNOSIS — N3000 Acute cystitis without hematuria: Secondary | ICD-10-CM | POA: Diagnosis not present

## 2021-12-19 DIAGNOSIS — R3121 Asymptomatic microscopic hematuria: Secondary | ICD-10-CM | POA: Diagnosis not present

## 2021-12-22 ENCOUNTER — Ambulatory Visit: Payer: Medicare HMO | Admitting: Orthopaedic Surgery

## 2022-01-11 ENCOUNTER — Telehealth: Payer: Self-pay | Admitting: Cardiology

## 2022-01-11 ENCOUNTER — Ambulatory Visit (INDEPENDENT_AMBULATORY_CARE_PROVIDER_SITE_OTHER): Payer: Medicare HMO | Admitting: Orthopaedic Surgery

## 2022-01-11 ENCOUNTER — Other Ambulatory Visit: Payer: Self-pay

## 2022-01-11 DIAGNOSIS — M170 Bilateral primary osteoarthritis of knee: Secondary | ICD-10-CM | POA: Diagnosis not present

## 2022-01-11 DIAGNOSIS — M25562 Pain in left knee: Secondary | ICD-10-CM | POA: Diagnosis not present

## 2022-01-11 DIAGNOSIS — I4819 Other persistent atrial fibrillation: Secondary | ICD-10-CM

## 2022-01-11 DIAGNOSIS — M1712 Unilateral primary osteoarthritis, left knee: Secondary | ICD-10-CM | POA: Diagnosis not present

## 2022-01-11 DIAGNOSIS — G8929 Other chronic pain: Secondary | ICD-10-CM | POA: Diagnosis not present

## 2022-01-11 DIAGNOSIS — M25561 Pain in right knee: Secondary | ICD-10-CM | POA: Diagnosis not present

## 2022-01-11 DIAGNOSIS — M1711 Unilateral primary osteoarthritis, right knee: Secondary | ICD-10-CM

## 2022-01-11 MED ORDER — METHYLPREDNISOLONE ACETATE 40 MG/ML IJ SUSP
40.0000 mg | INTRAMUSCULAR | Status: AC | PRN
  Administered 2022-01-11: 40 mg via INTRA_ARTICULAR

## 2022-01-11 MED ORDER — LIDOCAINE HCL 1 % IJ SOLN
3.0000 mL | INTRAMUSCULAR | Status: AC | PRN
  Administered 2022-01-11: 3 mL

## 2022-01-11 MED ORDER — APIXABAN 5 MG PO TABS
5.0000 mg | ORAL_TABLET | Freq: Two times a day (BID) | ORAL | 0 refills | Status: DC
Start: 2022-01-11 — End: 2022-06-20

## 2022-01-11 NOTE — Telephone Encounter (Signed)
Patient called wanting to speak to Malachy Mood, she states she has been working with her on some medications.
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
## 2022-01-11 NOTE — Telephone Encounter (Signed)
Called pt. She needs a refill on her Eliquis. She also asked for samples. Refill sent to anticoag pool, Samples placed at front desk.

## 2022-01-12 MED ORDER — APIXABAN 5 MG PO TABS: 5.0000 mg | ORAL_TABLET | Freq: Two times a day (BID) | ORAL | 3 refills | Status: DC

## 2022-01-23 DIAGNOSIS — M5136 Other intervertebral disc degeneration, lumbar region: Secondary | ICD-10-CM | POA: Diagnosis not present

## 2022-01-23 DIAGNOSIS — Z79891 Long term (current) use of opiate analgesic: Secondary | ICD-10-CM | POA: Diagnosis not present

## 2022-01-23 DIAGNOSIS — Z79899 Other long term (current) drug therapy: Secondary | ICD-10-CM | POA: Diagnosis not present

## 2022-01-23 DIAGNOSIS — G894 Chronic pain syndrome: Secondary | ICD-10-CM | POA: Diagnosis not present

## 2022-01-23 DIAGNOSIS — M545 Low back pain, unspecified: Secondary | ICD-10-CM | POA: Diagnosis not present

## 2022-02-10 DIAGNOSIS — N3021 Other chronic cystitis with hematuria: Secondary | ICD-10-CM | POA: Diagnosis not present

## 2022-02-10 DIAGNOSIS — N3000 Acute cystitis without hematuria: Secondary | ICD-10-CM | POA: Diagnosis not present

## 2022-02-10 DIAGNOSIS — R3121 Asymptomatic microscopic hematuria: Secondary | ICD-10-CM | POA: Diagnosis not present

## 2022-03-22 DIAGNOSIS — N952 Postmenopausal atrophic vaginitis: Secondary | ICD-10-CM | POA: Diagnosis not present

## 2022-03-22 DIAGNOSIS — N3021 Other chronic cystitis with hematuria: Secondary | ICD-10-CM | POA: Diagnosis not present

## 2022-03-22 DIAGNOSIS — N3020 Other chronic cystitis without hematuria: Secondary | ICD-10-CM | POA: Diagnosis not present

## 2022-03-31 DIAGNOSIS — I5030 Unspecified diastolic (congestive) heart failure: Secondary | ICD-10-CM | POA: Diagnosis not present

## 2022-03-31 DIAGNOSIS — G609 Hereditary and idiopathic neuropathy, unspecified: Secondary | ICD-10-CM | POA: Diagnosis not present

## 2022-03-31 DIAGNOSIS — E785 Hyperlipidemia, unspecified: Secondary | ICD-10-CM | POA: Diagnosis not present

## 2022-03-31 DIAGNOSIS — R61 Generalized hyperhidrosis: Secondary | ICD-10-CM | POA: Diagnosis not present

## 2022-03-31 DIAGNOSIS — D649 Anemia, unspecified: Secondary | ICD-10-CM | POA: Diagnosis not present

## 2022-03-31 DIAGNOSIS — R7989 Other specified abnormal findings of blood chemistry: Secondary | ICD-10-CM | POA: Diagnosis not present

## 2022-03-31 DIAGNOSIS — I129 Hypertensive chronic kidney disease with stage 1 through stage 4 chronic kidney disease, or unspecified chronic kidney disease: Secondary | ICD-10-CM | POA: Diagnosis not present

## 2022-03-31 DIAGNOSIS — I872 Venous insufficiency (chronic) (peripheral): Secondary | ICD-10-CM | POA: Diagnosis not present

## 2022-03-31 DIAGNOSIS — E039 Hypothyroidism, unspecified: Secondary | ICD-10-CM | POA: Diagnosis not present

## 2022-03-31 DIAGNOSIS — I4891 Unspecified atrial fibrillation: Secondary | ICD-10-CM | POA: Diagnosis not present

## 2022-03-31 DIAGNOSIS — M8580 Other specified disorders of bone density and structure, unspecified site: Secondary | ICD-10-CM | POA: Diagnosis not present

## 2022-03-31 DIAGNOSIS — K219 Gastro-esophageal reflux disease without esophagitis: Secondary | ICD-10-CM | POA: Diagnosis not present

## 2022-03-31 DIAGNOSIS — F3340 Major depressive disorder, recurrent, in remission, unspecified: Secondary | ICD-10-CM | POA: Diagnosis not present

## 2022-03-31 DIAGNOSIS — N1831 Chronic kidney disease, stage 3a: Secondary | ICD-10-CM | POA: Diagnosis not present

## 2022-04-03 DIAGNOSIS — M545 Low back pain, unspecified: Secondary | ICD-10-CM | POA: Diagnosis not present

## 2022-04-03 DIAGNOSIS — M5136 Other intervertebral disc degeneration, lumbar region: Secondary | ICD-10-CM | POA: Diagnosis not present

## 2022-04-17 ENCOUNTER — Other Ambulatory Visit: Payer: Self-pay | Admitting: Internal Medicine

## 2022-04-17 DIAGNOSIS — R911 Solitary pulmonary nodule: Secondary | ICD-10-CM
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
## 2022-05-08 ENCOUNTER — Encounter: Payer: Self-pay | Admitting: Cardiology

## 2022-05-08 ENCOUNTER — Ambulatory Visit: Payer: Medicare HMO | Attending: Cardiology | Admitting: Cardiology

## 2022-05-08 VITALS — BP 100/80 | HR 83 | Wt 126.8 lb

## 2022-05-08 DIAGNOSIS — I4819 Other persistent atrial fibrillation: Secondary | ICD-10-CM

## 2022-05-08 DIAGNOSIS — I5032 Chronic diastolic (congestive) heart failure: Secondary | ICD-10-CM | POA: Diagnosis not present

## 2022-05-08 DIAGNOSIS — I482 Chronic atrial fibrillation, unspecified: Secondary | ICD-10-CM

## 2022-05-08 DIAGNOSIS — I5181 Takotsubo syndrome: Secondary | ICD-10-CM | POA: Diagnosis not present

## 2022-05-08 MED ORDER — APIXABAN 5 MG PO TABS: 5.0000 mg | ORAL_TABLET | Freq: Two times a day (BID) | ORAL | 3 refills | Status: DC

## 2022-05-08 NOTE — Addendum Note (Signed)
Addended by: Kathyrn Lass on: 05/08/2022 03:14 PM   Modules accepted: Orders

## 2022-05-08 NOTE — Patient Instructions (Signed)
Stop taking spironolactone (aldactone)

## 2022-05-08 NOTE — Addendum Note (Signed)
Addended by: Kathyrn Lass on: 05/08/2022 03:20 PM   Modules accepted: Orders

## 2022-05-15 ENCOUNTER — Ambulatory Visit: Payer: Medicare HMO | Admitting: Orthopaedic Surgery

## 2022-05-18 ENCOUNTER — Inpatient Hospital Stay: Admission: RE | Admit: 2022-05-18 | Payer: Medicare HMO | Source: Ambulatory Visit

## 2022-05-31 DIAGNOSIS — R3121 Asymptomatic microscopic hematuria: Secondary | ICD-10-CM | POA: Diagnosis not present

## 2022-05-31 DIAGNOSIS — N952 Postmenopausal atrophic vaginitis: Secondary | ICD-10-CM | POA: Diagnosis not present

## 2022-05-31 DIAGNOSIS — N3000 Acute cystitis without hematuria: Secondary | ICD-10-CM | POA: Diagnosis not present

## 2022-06-01 DIAGNOSIS — M5136 Other intervertebral disc degeneration, lumbar region: Secondary | ICD-10-CM | POA: Diagnosis not present

## 2022-06-17 ENCOUNTER — Emergency Department (HOSPITAL_COMMUNITY): Payer: Medicare HMO

## 2022-06-17 ENCOUNTER — Inpatient Hospital Stay (HOSPITAL_COMMUNITY)
Admission: EM | Admit: 2022-06-17 | Discharge: 2022-06-20 | DRG: 695 | Disposition: A | Discharge status: Home Health | Payer: Medicare HMO | Attending: Internal Medicine | Admitting: Internal Medicine

## 2022-06-17 ENCOUNTER — Other Ambulatory Visit: Payer: Self-pay

## 2022-06-17 ENCOUNTER — Encounter (HOSPITAL_COMMUNITY): Payer: Self-pay

## 2022-06-17 DIAGNOSIS — Z96641 Presence of right artificial hip joint: Secondary | ICD-10-CM | POA: Diagnosis present

## 2022-06-17 DIAGNOSIS — I129 Hypertensive chronic kidney disease with stage 1 through stage 4 chronic kidney disease, or unspecified chronic kidney disease: Secondary | ICD-10-CM | POA: Diagnosis present

## 2022-06-17 DIAGNOSIS — I10 Essential (primary) hypertension: Secondary | ICD-10-CM | POA: Diagnosis not present

## 2022-06-17 DIAGNOSIS — R8281 Pyuria: Secondary | ICD-10-CM

## 2022-06-17 DIAGNOSIS — Z905 Acquired absence of kidney: Secondary | ICD-10-CM

## 2022-06-17 DIAGNOSIS — Z7901 Long term (current) use of anticoagulants: Secondary | ICD-10-CM | POA: Diagnosis not present

## 2022-06-17 DIAGNOSIS — Z823 Family history of stroke: Secondary | ICD-10-CM | POA: Diagnosis not present

## 2022-06-17 DIAGNOSIS — R739 Hyperglycemia, unspecified: Secondary | ICD-10-CM

## 2022-06-17 DIAGNOSIS — Z90710 Acquired absence of both cervix and uterus: Secondary | ICD-10-CM

## 2022-06-17 DIAGNOSIS — G8929 Other chronic pain: Secondary | ICD-10-CM | POA: Diagnosis not present

## 2022-06-17 DIAGNOSIS — E785 Hyperlipidemia, unspecified: Secondary | ICD-10-CM | POA: Diagnosis present

## 2022-06-17 DIAGNOSIS — Z79890 Hormone replacement therapy (postmenopausal): Secondary | ICD-10-CM

## 2022-06-17 DIAGNOSIS — Z8249 Family history of ischemic heart disease and other diseases of the circulatory system: Secondary | ICD-10-CM

## 2022-06-17 DIAGNOSIS — R4182 Altered mental status, unspecified: Secondary | ICD-10-CM

## 2022-06-17 DIAGNOSIS — G9341 Metabolic encephalopathy: Secondary | ICD-10-CM | POA: Diagnosis present

## 2022-06-17 DIAGNOSIS — I4819 Other persistent atrial fibrillation: Secondary | ICD-10-CM | POA: Diagnosis not present

## 2022-06-17 DIAGNOSIS — K219 Gastro-esophageal reflux disease without esophagitis: Secondary | ICD-10-CM

## 2022-06-17 DIAGNOSIS — R197 Diarrhea, unspecified: Secondary | ICD-10-CM | POA: Diagnosis present

## 2022-06-17 DIAGNOSIS — N39490 Overflow incontinence: Secondary | ICD-10-CM | POA: Diagnosis present

## 2022-06-17 DIAGNOSIS — Z79899 Other long term (current) drug therapy: Secondary | ICD-10-CM

## 2022-06-17 DIAGNOSIS — R338 Other retention of urine: Secondary | ICD-10-CM | POA: Diagnosis not present

## 2022-06-17 DIAGNOSIS — E7800 Pure hypercholesterolemia, unspecified: Secondary | ICD-10-CM | POA: Diagnosis present

## 2022-06-17 DIAGNOSIS — E860 Dehydration: Secondary | ICD-10-CM | POA: Diagnosis not present

## 2022-06-17 DIAGNOSIS — K529 Noninfective gastroenteritis and colitis, unspecified: Secondary | ICD-10-CM | POA: Diagnosis present

## 2022-06-17 DIAGNOSIS — E876 Hypokalemia: Secondary | ICD-10-CM | POA: Diagnosis present

## 2022-06-17 DIAGNOSIS — R410 Disorientation, unspecified: Secondary | ICD-10-CM | POA: Diagnosis not present

## 2022-06-17 DIAGNOSIS — E039 Hypothyroidism, unspecified: Secondary | ICD-10-CM | POA: Diagnosis not present

## 2022-06-17 DIAGNOSIS — J449 Chronic obstructive pulmonary disease, unspecified: Secondary | ICD-10-CM | POA: Diagnosis not present

## 2022-06-17 DIAGNOSIS — N390 Urinary tract infection, site not specified: Secondary | ICD-10-CM | POA: Diagnosis not present

## 2022-06-17 DIAGNOSIS — K6389 Other specified diseases of intestine: Secondary | ICD-10-CM | POA: Diagnosis not present

## 2022-06-17 DIAGNOSIS — Z8673 Personal history of transient ischemic attack (TIA), and cerebral infarction without residual deficits: Secondary | ICD-10-CM

## 2022-06-17 DIAGNOSIS — R339 Retention of urine, unspecified: Secondary | ICD-10-CM | POA: Diagnosis not present

## 2022-06-17 DIAGNOSIS — A09 Infectious gastroenteritis and colitis, unspecified: Secondary | ICD-10-CM

## 2022-06-17 DIAGNOSIS — Z87891 Personal history of nicotine dependence: Secondary | ICD-10-CM

## 2022-06-17 DIAGNOSIS — N3289 Other specified disorders of bladder: Secondary | ICD-10-CM | POA: Diagnosis not present

## 2022-06-17 LAB — CBC
HCT: 46.1 % — ABNORMAL HIGH (ref 36.0–46.0)
Hemoglobin: 14.8 g/dL (ref 12.0–15.0)
MCH: 31.5 pg (ref 26.0–34.0)
MCHC: 32.1 g/dL (ref 30.0–36.0)
MCV: 98.1 fL (ref 80.0–100.0)
Platelets: 209 10*3/uL (ref 150–400)
RBC: 4.7 MIL/uL (ref 3.87–5.11)
RDW: 11.8 % (ref 11.5–15.5)
WBC: 5.7 10*3/uL (ref 4.0–10.5)
nRBC: 0 % (ref 0.0–0.2)

## 2022-06-17 LAB — URINALYSIS, ROUTINE W REFLEX MICROSCOPIC
Bilirubin Urine: NEGATIVE
Glucose, UA: NEGATIVE mg/dL
Hgb urine dipstick: NEGATIVE
Ketones, ur: 20 mg/dL — AB
Nitrite: NEGATIVE
Protein, ur: 30 mg/dL — AB
Specific Gravity, Urine: 1.011 (ref 1.005–1.030)
Trans Epithel, UA: 1
pH: 8 (ref 5.0–8.0)

## 2022-06-17 LAB — COMPREHENSIVE METABOLIC PANEL
ALT: 11 U/L (ref 0–44)
AST: 18 U/L (ref 15–41)
Albumin: 4.3 g/dL (ref 3.5–5.0)
Alkaline Phosphatase: 59 U/L (ref 38–126)
Anion gap: 11 (ref 5–15)
BUN: 13 mg/dL (ref 8–23)
CO2: 25 mmol/L (ref 22–32)
Calcium: 10.2 mg/dL (ref 8.9–10.3)
Chloride: 103 mmol/L (ref 98–111)
Creatinine, Ser: 0.92 mg/dL (ref 0.44–1.00)
GFR, Estimated: >60 mL/min (ref >60)
Glucose, Bld: 180 mg/dL — ABNORMAL HIGH (ref 70–99)
Potassium: 3.5 mmol/L (ref 3.5–5.1)
Sodium: 139 mmol/L (ref 135–145)
Total Bilirubin: 2.1 mg/dL — ABNORMAL HIGH (ref 0.3–1.2)
Total Protein: 7.3 g/dL (ref 6.5–8.1)

## 2022-06-17 LAB — LACTIC ACID, PLASMA: Lactic Acid, Venous: 1.1 mmol/L (ref 0.5–1.9)

## 2022-06-17 LAB — BLOOD GAS, VENOUS
Acid-Base Excess: 4 mmol/L — ABNORMAL HIGH (ref 0.0–2.0)
Bicarbonate: 27.6 mmol/L (ref 20.0–28.0)
O2 Saturation: 97.9 %
Patient temperature: 37
pCO2, Ven: 37 mmHg — ABNORMAL LOW (ref 44–60)
pH, Ven: 7.48 — ABNORMAL HIGH (ref 7.25–7.43)
pO2, Ven: 77 mmHg — ABNORMAL HIGH (ref 32–45)

## 2022-06-17 LAB — T4, FREE: Free T4: 1.37 ng/dL — ABNORMAL HIGH (ref 0.61–1.12)

## 2022-06-17 LAB — LIPASE, BLOOD: Lipase: 25 U/L (ref 11–51)

## 2022-06-17 LAB — AMMONIA: Ammonia: 25 umol/L (ref 9–35)

## 2022-06-17 LAB — ETHANOL: Alcohol, Ethyl (B): <10 mg/dL (ref <10)

## 2022-06-17 LAB — TSH: TSH: 0.065 u[IU]/mL — ABNORMAL LOW (ref 0.350–4.500)

## 2022-06-17 MED ORDER — ONDANSETRON HCL 4 MG PO TABS: 4.0000 mg | ORAL_TABLET | Freq: Four times a day (QID) | ORAL | Status: DC | PRN

## 2022-06-17 MED ORDER — BUPROPION HCL 75 MG PO TABS
75.0000 mg | ORAL_TABLET | Freq: Every morning | ORAL | Status: DC
  Administered 2022-06-18 – 2022-06-20 (×3): 75 mg via ORAL
  Filled 2022-06-17 (×3): qty 1

## 2022-06-17 MED ORDER — SODIUM CHLORIDE 0.9 % IV SOLN
1.0000 g | Freq: Once | INTRAVENOUS | Status: AC
  Administered 2022-06-17: 1 g via INTRAVENOUS
  Filled 2022-06-17: qty 10

## 2022-06-17 MED ORDER — ONDANSETRON HCL 4 MG/2ML IJ SOLN: 4.0000 mg | Freq: Four times a day (QID) | INTRAMUSCULAR | Status: DC | PRN

## 2022-06-17 MED ORDER — SODIUM CHLORIDE 0.9 % IV SOLN
1.0000 g | INTRAVENOUS | Status: DC
  Administered 2022-06-18 – 2022-06-19 (×2): 1 g via INTRAVENOUS
  Filled 2022-06-17 (×3): qty 10

## 2022-06-17 MED ORDER — SODIUM CHLORIDE 0.9 % IV BOLUS
1000.0000 mL | Freq: Once | INTRAVENOUS | Status: AC
  Administered 2022-06-17: 1000 mL via INTRAVENOUS

## 2022-06-17 MED ORDER — APIXABAN 5 MG PO TABS
5.0000 mg | ORAL_TABLET | Freq: Two times a day (BID) | ORAL | Status: DC
  Administered 2022-06-17 – 2022-06-20 (×6): 5 mg via ORAL
  Filled 2022-06-17 (×6): qty 1

## 2022-06-17 MED ORDER — LEVOTHYROXINE SODIUM 88 MCG PO TABS
88.0000 ug | ORAL_TABLET | Freq: Every day | ORAL | Status: DC
  Administered 2022-06-18: 88 ug via ORAL
  Filled 2022-06-17: qty 1

## 2022-06-17 MED ORDER — ACETAMINOPHEN 325 MG PO TABS
650.0000 mg | ORAL_TABLET | Freq: Four times a day (QID) | ORAL | Status: DC | PRN
  Administered 2022-06-18 – 2022-06-19 (×2): 650 mg via ORAL
  Filled 2022-06-17 (×2): qty 2

## 2022-06-17 MED ORDER — ACETAMINOPHEN 650 MG RE SUPP: 650.0000 mg | Freq: Four times a day (QID) | RECTAL | Status: DC | PRN

## 2022-06-17 MED ORDER — FLUOXETINE HCL 20 MG PO CAPS
20.0000 mg | ORAL_CAPSULE | Freq: Every day | ORAL | Status: DC
  Administered 2022-06-17 – 2022-06-19 (×3): 20 mg via ORAL
  Filled 2022-06-17 (×3): qty 1

## 2022-06-17 MED ORDER — SODIUM CHLORIDE 0.9 % IV BOLUS: 1000.0000 mL | Freq: Once | INTRAVENOUS | Status: DC

## 2022-06-17 MED ORDER — BISOPROLOL FUMARATE 5 MG PO TABS
2.5000 mg | ORAL_TABLET | Freq: Every day | ORAL | Status: DC
  Administered 2022-06-17 – 2022-06-20 (×4): 2.5 mg via ORAL
  Filled 2022-06-17 (×4): qty 1

## 2022-06-17 MED ORDER — ATORVASTATIN CALCIUM 10 MG PO TABS
10.0000 mg | ORAL_TABLET | Freq: Every day | ORAL | Status: DC
  Administered 2022-06-18 – 2022-06-20 (×3): 10 mg via ORAL
  Filled 2022-06-17 (×3): qty 1

## 2022-06-17 MED ORDER — LORAZEPAM 2 MG/ML IJ SOLN
1.0000 mg | Freq: Once | INTRAMUSCULAR | Status: AC
  Administered 2022-06-17: 1 mg via INTRAVENOUS
  Filled 2022-06-17: qty 1

## 2022-06-17 MED ORDER — SODIUM CHLORIDE 0.9 % IV SOLN: INTRAVENOUS | Status: DC

## 2022-06-17 NOTE — ED Notes (Signed)
Attempted to assist patient to the restroom. Patient continued to endorse that she needed to use the restroom. Patient would not however get up to use the Denna Haggard. This Engineer, maintenance (IT) [place] Ar attempted to place patient on bedpan without success. Patient remained lying on her right side and attempted to swat at staff when bedpan was placed under her.
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
## 2022-06-17 NOTE — ED Notes (Signed)
During last full set of vitals pt was very agitated. Patient kept repeating that she needed to have a bowel movement but when placed on the bedpan she began swatting at her visitor and this Probation officer.
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
## 2022-06-17 NOTE — ED Triage Notes (Signed)
Patient BIB [facility] from home. Family said patient had diarrhea last night, feeling hot and sweaty, nauseous. Uncooperative.   EMS '650mg'$  Tylenol

## 2022-06-18 DIAGNOSIS — R8281 Pyuria: Secondary | ICD-10-CM

## 2022-06-18 DIAGNOSIS — J449 Chronic obstructive pulmonary disease, unspecified: Secondary | ICD-10-CM | POA: Diagnosis present

## 2022-06-18 DIAGNOSIS — N390 Urinary tract infection, site not specified: Secondary | ICD-10-CM

## 2022-06-18 DIAGNOSIS — E7800 Pure hypercholesterolemia, unspecified: Secondary | ICD-10-CM | POA: Diagnosis present

## 2022-06-18 DIAGNOSIS — R739 Hyperglycemia, unspecified: Secondary | ICD-10-CM | POA: Diagnosis present

## 2022-06-18 DIAGNOSIS — Z8673 Personal history of transient ischemic attack (TIA), and cerebral infarction without residual deficits: Secondary | ICD-10-CM | POA: Diagnosis not present

## 2022-06-18 DIAGNOSIS — E860 Dehydration: Secondary | ICD-10-CM | POA: Diagnosis not present

## 2022-06-18 DIAGNOSIS — Z96641 Presence of right artificial hip joint: Secondary | ICD-10-CM | POA: Diagnosis present

## 2022-06-18 DIAGNOSIS — R4182 Altered mental status, unspecified: Secondary | ICD-10-CM | POA: Diagnosis present

## 2022-06-18 DIAGNOSIS — Z87891 Personal history of nicotine dependence: Secondary | ICD-10-CM | POA: Diagnosis not present

## 2022-06-18 DIAGNOSIS — G9341 Metabolic encephalopathy: Secondary | ICD-10-CM | POA: Diagnosis present

## 2022-06-18 DIAGNOSIS — R339 Retention of urine, unspecified: Secondary | ICD-10-CM | POA: Diagnosis present

## 2022-06-18 DIAGNOSIS — Z905 Acquired absence of kidney: Secondary | ICD-10-CM | POA: Diagnosis not present

## 2022-06-18 DIAGNOSIS — I4819 Other persistent atrial fibrillation: Secondary | ICD-10-CM | POA: Diagnosis present

## 2022-06-18 DIAGNOSIS — E876 Hypokalemia: Secondary | ICD-10-CM | POA: Diagnosis present

## 2022-06-18 DIAGNOSIS — Z79899 Other long term (current) drug therapy: Secondary | ICD-10-CM | POA: Diagnosis not present

## 2022-06-18 DIAGNOSIS — Z7901 Long term (current) use of anticoagulants: Secondary | ICD-10-CM | POA: Diagnosis not present

## 2022-06-18 DIAGNOSIS — R338 Other retention of urine: Secondary | ICD-10-CM | POA: Diagnosis not present

## 2022-06-18 DIAGNOSIS — K219 Gastro-esophageal reflux disease without esophagitis: Secondary | ICD-10-CM | POA: Diagnosis present

## 2022-06-18 DIAGNOSIS — E039 Hypothyroidism, unspecified: Secondary | ICD-10-CM | POA: Diagnosis present

## 2022-06-18 DIAGNOSIS — Z79890 Hormone replacement therapy (postmenopausal): Secondary | ICD-10-CM | POA: Diagnosis not present

## 2022-06-18 DIAGNOSIS — K529 Noninfective gastroenteritis and colitis, unspecified: Secondary | ICD-10-CM

## 2022-06-18 DIAGNOSIS — I129 Hypertensive chronic kidney disease with stage 1 through stage 4 chronic kidney disease, or unspecified chronic kidney disease: Secondary | ICD-10-CM | POA: Diagnosis present

## 2022-06-18 DIAGNOSIS — Z90710 Acquired absence of both cervix and uterus: Secondary | ICD-10-CM | POA: Diagnosis not present

## 2022-06-18 DIAGNOSIS — Z823 Family history of stroke: Secondary | ICD-10-CM | POA: Diagnosis not present

## 2022-06-18 DIAGNOSIS — Z8249 Family history of ischemic heart disease and other diseases of the circulatory system: Secondary | ICD-10-CM | POA: Diagnosis not present

## 2022-06-18 DIAGNOSIS — N39490 Overflow incontinence: Secondary | ICD-10-CM | POA: Diagnosis present

## 2022-06-18 LAB — COMPREHENSIVE METABOLIC PANEL
ALT: 10 U/L (ref 0–44)
AST: 13 U/L — ABNORMAL LOW (ref 15–41)
Albumin: 3.7 g/dL (ref 3.5–5.0)
Alkaline Phosphatase: 49 U/L (ref 38–126)
Anion gap: 9 (ref 5–15)
BUN: 16 mg/dL (ref 8–23)
CO2: 24 mmol/L (ref 22–32)
Calcium: 9.7 mg/dL (ref 8.9–10.3)
Chloride: 107 mmol/L (ref 98–111)
Creatinine, Ser: 0.83 mg/dL (ref 0.44–1.00)
GFR, Estimated: >60 mL/min (ref >60)
Glucose, Bld: 99 mg/dL (ref 70–99)
Potassium: 3.3 mmol/L — ABNORMAL LOW (ref 3.5–5.1)
Sodium: 140 mmol/L (ref 135–145)
Total Bilirubin: 1.4 mg/dL — ABNORMAL HIGH (ref 0.3–1.2)
Total Protein: 6.3 g/dL — ABNORMAL LOW (ref 6.5–8.1)

## 2022-06-18 LAB — CBC
HCT: 40.3 % (ref 36.0–46.0)
Hemoglobin: 13.2 g/dL (ref 12.0–15.0)
MCH: 31.2 pg (ref 26.0–34.0)
MCHC: 32.8 g/dL (ref 30.0–36.0)
MCV: 95.3 fL (ref 80.0–100.0)
Platelets: 224 10*3/uL (ref 150–400)
RBC: 4.23 MIL/uL (ref 3.87–5.11)
RDW: 11.8 % (ref 11.5–15.5)
WBC: 7.6 10*3/uL (ref 4.0–10.5)
nRBC: 0 % (ref 0.0–0.2)

## 2022-06-18 LAB — HEMOGLOBIN A1C
Hgb A1c MFr Bld: 4.9 % (ref 4.8–5.6)
Mean Plasma Glucose: 93.93 mg/dL

## 2022-06-18 MED ORDER — MELATONIN 5 MG PO TABS
5.0000 mg | ORAL_TABLET | Freq: Once | ORAL | Status: AC | PRN
  Administered 2022-06-18: 5 mg via ORAL
  Filled 2022-06-18: qty 1

## 2022-06-18 MED ORDER — LEVOTHYROXINE SODIUM 75 MCG PO TABS
75.0000 ug | ORAL_TABLET | ORAL | Status: DC
  Administered 2022-06-19: 75 ug via ORAL
  Filled 2022-06-18: qty 1

## 2022-06-18 MED ORDER — LEVOTHYROXINE SODIUM 88 MCG PO TABS: 88.0000 ug | ORAL_TABLET | ORAL | Status: DC

## 2022-06-18 MED ORDER — LEVOTHYROXINE SODIUM 50 MCG PO TABS: 50.0000 ug | ORAL_TABLET | ORAL | Status: DC

## 2022-06-18 MED ORDER — LORAZEPAM 2 MG/ML IJ SOLN
0.5000 mg | Freq: Once | INTRAMUSCULAR | Status: AC
  Administered 2022-06-18: 0.5 mg via INTRAVENOUS
  Filled 2022-06-18: qty 1

## 2022-06-18 MED ORDER — POTASSIUM CHLORIDE CRYS ER 20 MEQ PO TBCR
40.0000 meq | EXTENDED_RELEASE_TABLET | ORAL | Status: AC
  Administered 2022-06-18 (×2): 40 meq via ORAL
  Filled 2022-06-18 (×2): qty 2

## 2022-06-18 NOTE — Progress Notes (Addendum)
[facility] Progress Note  Patient: [name]     KDT:[id number]  DOA: 06/17/2022   PCP: Prince Solian, MD       Brief hospital course: This is a 76 year old female with atrial fibrillation, hypothyroidism and chronic diarrhea who presents to the hospital with complaints of abdominal pain.  According to the history obtained from her daughter, they found her very diaphoretic and confused yesterday.  She complained of severe abdominal pain.  While in the ED the patient continued to state that she had to urinate and despite having multiple episodes of incontinence in the bed she continued to have discomfort and agitation along with confusion.  She was given a dose of IV Ativan and was able to rest after that.  Subjective:  Found this morning to be severely agitated hunched over in the bed complaining of pain in her abdomen.     Assessment and Plan: Principal Problem:   Acute metabolic encephalopathy -Suspect this was secondary to acute urinary retention with a possible underlying UTI  - Active Problems:    Acute urinary retention,  Pyuria -UA performed in the ED showed a small amount of bacteria and WBCs - In and out catheter ordered by me today showed that she was retaining about 500 cc of urine in her bladder - Subsequent I & O cath the patient's pain has resolved and she is resting - We will order bladder scans every 6 hours and continue to follow overnight - Ceftriaxone was started in the ED and can be continued while we await urine culture    Hypercholesterolemia   HTN (hypertension)   Persistent atrial fibrillation -Continue apixaban and bisoprolol    Hypothyroidism -TSH slightly suppressed at 0.065, free T4 is 137 - We will reduce Synthroid from 88 mcg to 75 mcg - Recommend follow-up as outpatient in 4 weeks    Chronic diarrhea - follow  Hypokalemia - replaced        Code Status: Full Code DVT prophylaxis:   apixaban (ELIQUIS) tablet 5 mg     Consultants: none Level of Care: Level of care: Med-Surg Total time on patient care:  Objective:   Vitals:   06/17/22 2023 06/18/22 0213 06/18/22 0612 06/18/22 0950  BP: (!) 141/90 (!) 145/94 (!) 148/81 (!) 177/111  Pulse: 86 80 85 90  Resp: '19 16 16   '$ Temp: 98.2 F (36.8 C) 99.1 F (37.3 C) 99 F (37.2 C) 98.9 F (37.2 C)  TempSrc: Oral Oral Oral Oral  SpO2: 99% 96% 98% 98%   There were no vitals filed for this visit. Exam: General exam: Appears comfortable  HEENT: oral mucosa moist Respiratory system: Clear to auscultation.  Cardiovascular system: S1 & S2 heard  Gastrointestinal system: Abdomen soft, non-tender, nondistended. Normal bowel sounds   Extremities: No cyanosis, clubbing or edema Psychiatry:  Mood & affect appropriate.    Imaging and lab data was personally reviewed    CBC: Recent Labs  Lab 06/17/22 1048 06/18/22 0449  WBC 5.7 7.6  HGB 14.8 13.2  HCT 46.1* 40.3  MCV 98.1 95.3  PLT 209 998   Basic Metabolic Panel: Recent Labs  Lab 06/17/22 1048 06/18/22 0449  NA 139 140  K 3.5 3.3*  CL 103 107  CO2 25 24  GLUCOSE 180* 99  BUN 13 16  CREATININE 0.92 0.83  CALCIUM 10.2 9.7   GFR: CrCl cannot be calculated (Unknown ideal weight.).  Scheduled Meds:  apixaban  5 mg Oral BID   atorvastatin  10 mg Oral Daily   bisoprolol  2.5 mg Oral Daily   buPROPion  75 mg Oral q morning   FLUoxetine  20 mg Oral QHS   [START ON 06/19/2022] levothyroxine  50 mcg Oral QODAY   [START ON 06/21/2022] levothyroxine  88 mcg Oral QODAY   Continuous Infusions:  sodium chloride 75 mL/hr at 06/18/22 0922   cefTRIAXone (ROCEPHIN)  IV       LOS: 0 days   Author: Debbe Odea  06/18/2022 12:50 PM

## 2022-06-18 NOTE — Progress Notes (Signed)
In and out catheter done as per ordered output was 42m. Will continue monitor the patient.

## 2022-06-19 DIAGNOSIS — R338 Other retention of urine: Secondary | ICD-10-CM | POA: Diagnosis not present

## 2022-06-19 DIAGNOSIS — G9341 Metabolic encephalopathy: Secondary | ICD-10-CM | POA: Diagnosis not present

## 2022-06-19 DIAGNOSIS — E860 Dehydration: Secondary | ICD-10-CM | POA: Diagnosis not present

## 2022-06-19 DIAGNOSIS — R4182 Altered mental status, unspecified: Secondary | ICD-10-CM | POA: Diagnosis not present

## 2022-06-19 LAB — C DIFFICILE QUICK SCREEN W PCR REFLEX
C Diff antigen: NEGATIVE
C Diff interpretation: NOT DETECTED
C Diff toxin: NEGATIVE

## 2022-06-19 LAB — BASIC METABOLIC PANEL
Anion gap: 11 (ref 5–15)
BUN: 15 mg/dL (ref 8–23)
CO2: 22 mmol/L (ref 22–32)
Calcium: 9.8 mg/dL (ref 8.9–10.3)
Chloride: 106 mmol/L (ref 98–111)
Creatinine, Ser: 0.77 mg/dL (ref 0.44–1.00)
GFR, Estimated: >60 mL/min (ref >60)
Glucose, Bld: 91 mg/dL (ref 70–99)
Potassium: 3.5 mmol/L (ref 3.5–5.1)
Sodium: 139 mmol/L (ref 135–145)

## 2022-06-19 LAB — URINE CULTURE: Culture: 2000 — AB

## 2022-06-19 MED ORDER — GABAPENTIN 300 MG PO CAPS
600.0000 mg | ORAL_CAPSULE | Freq: Three times a day (TID) | ORAL | Status: DC
  Administered 2022-06-19 – 2022-06-20 (×4): 600 mg via ORAL
  Filled 2022-06-19 (×4): qty 2

## 2022-06-19 MED ORDER — FENTANYL 12 MCG/HR TD PT72
1.0000 | MEDICATED_PATCH | TRANSDERMAL | Status: DC
  Administered 2022-06-19: 1 via TRANSDERMAL
  Filled 2022-06-19: qty 1

## 2022-06-19 MED ORDER — FENTANYL 37.5 MCG/HR TD PT72: 1.0000 | MEDICATED_PATCH | TRANSDERMAL | Status: DC

## 2022-06-19 MED ORDER — FENTANYL 25 MCG/HR TD PT72
1.0000 | MEDICATED_PATCH | TRANSDERMAL | Status: DC
  Administered 2022-06-19: 1 via TRANSDERMAL
  Filled 2022-06-19: qty 1

## 2022-06-19 NOTE — Progress Notes (Addendum)
[facility] Progress Note  Patient: [name]     OEV:[id number]  DOA: 06/17/2022   PCP: Prince Solian, MD       Brief hospital course: This is a 76 year old female with atrial fibrillation, hypothyroidism and chronic diarrhea who presents to the hospital with complaints of abdominal pain.  According to the history obtained from her daughter, they found her very diaphoretic and confused yesterday.  She complained of severe abdominal pain.  While in the ED the patient continued to state that she had to urinate and despite having multiple episodes of incontinence in the bed she continued to have discomfort and agitation along with confusion.  She was given a dose of IV Ativan and was able to rest after that.  Subjective:  Extremely agitated again this AM. I and O cath done and again found to be retaining urine. Foley cath has been placed. Per family, she remains more confused than her baseline.    Assessment and Plan: Principal Problem:   Acute metabolic encephalopathy -Suspect this is secondary to acute urinary retention with a possible underlying UTI - she has also received Ativan during this hospital stay for agitation and may be confused from this - will hold off on further medications that alter sensorium  - Active Problems:    Acute urinary retention,  Pyuria -UA performed in the ED showed a small amount of bacteria and WBCs - Ceftriaxone was started in the ED and can be continued while we await urine culture  - she has urinary retention with overflow incontinence- urology has been consulted and foley placed today- she will need to dc home with this -  plan to f/u with Dr Diona Fanti in 2 wks  Poor oral intake - due to above- she is barely eating- continue IVF and follow oral intake  Elevated T bili - T bili 2.1 - appears to be improving- follow for now    Hypercholesterolemia   HTN (hypertension)   Persistent atrial fibrillation -Continue apixaban and  bisoprolol    Hypothyroidism -TSH slightly suppressed at 0.065, free T4 is 137 - We will reduce Synthroid from 88 mcg to 75 mcg - Recommend follow-up as outpatient in 4 weeks    Chronic diarrhea -  no acute increase in diarrhea- - GI pathogen panel is pending- C diff neg  Hypokalemia - replaced- follow        Code Status: Full Code DVT prophylaxis:   apixaban (ELIQUIS) tablet 5 mg  Consultants: none Level of Care: Level of care: Med-Surg   Objective:   Vitals:   06/18/22 1447 06/18/22 2105 06/19/22 0452 06/19/22 0454  BP: (!) 159/100 (!) 160/92 (!) 157/103 (!) 156/88  Pulse: 88 70 92 84  Resp:  '18 18 18  '$ Temp:  98.6 F (37 C) 98.4 F (36.9 C) 98.4 F (36.9 C)  TempSrc:  Oral Oral Oral  SpO2: 98% 97% 98% 97%    Exam: General exam: very agitated and confused this AM- much more calm after foley placed HEENT: oral mucosa moist Respiratory system: Clear to auscultation.  Cardiovascular system: S1 & S2 heard  Gastrointestinal system: Abdomen soft, non-tender, nondistended. Normal bowel sounds   Extremities: No cyanosis, clubbing or edema    Imaging and lab data was personally reviewed    CBC: Recent Labs  Lab 06/17/22 1048 06/18/22 0449  WBC 5.7 7.6  HGB 14.8 13.2  HCT 46.1* 40.3  MCV 98.1 95.3  PLT 209 829    Basic Metabolic Panel: Recent  Labs  Lab 06/17/22 1048 06/18/22 0449 06/19/22 0822  NA 139 140 139  K 3.5 3.3* 3.5  CL 103 107 106  CO2 '25 24 22  '$ GLUCOSE 180* 99 91  BUN '13 16 15  '$ CREATININE 0.92 0.83 0.77  CALCIUM 10.2 9.7 9.8    GFR: CrCl cannot be calculated (Unknown ideal weight.).  Scheduled Meds:  apixaban  5 mg Oral BID   atorvastatin  10 mg Oral Daily   bisoprolol  2.5 mg Oral Daily   buPROPion  75 mg Oral q morning   fentaNYL  1 patch Transdermal Q72H   fentaNYL  1 patch Transdermal Q72H   FLUoxetine  20 mg Oral QHS   gabapentin  600 mg Oral TID   levothyroxine  75 mcg Oral QODAY   Continuous Infusions:  sodium  chloride 75 mL/hr at 06/19/22 1130   cefTRIAXone (ROCEPHIN)  IV 1 g (06/18/22 1506)     LOS: 1 day   Author: Debbe Odea  06/19/2022 12:55 PM
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
## 2022-06-20 DIAGNOSIS — G9341 Metabolic encephalopathy: Secondary | ICD-10-CM | POA: Diagnosis not present

## 2022-06-20 LAB — COMPREHENSIVE METABOLIC PANEL
ALT: 10 U/L (ref 0–44)
AST: 14 U/L — ABNORMAL LOW (ref 15–41)
Albumin: 3.7 g/dL (ref 3.5–5.0)
Alkaline Phosphatase: 46 U/L (ref 38–126)
Anion gap: 8 (ref 5–15)
BUN: 13 mg/dL (ref 8–23)
CO2: 25 mmol/L (ref 22–32)
Calcium: 9.6 mg/dL (ref 8.9–10.3)
Chloride: 106 mmol/L (ref 98–111)
Creatinine, Ser: 0.74 mg/dL (ref 0.44–1.00)
GFR, Estimated: >60 mL/min (ref >60)
Glucose, Bld: 99 mg/dL (ref 70–99)
Potassium: 3.3 mmol/L — ABNORMAL LOW (ref 3.5–5.1)
Sodium: 139 mmol/L (ref 135–145)
Total Bilirubin: 1.1 mg/dL (ref 0.3–1.2)
Total Protein: 5.9 g/dL — ABNORMAL LOW (ref 6.5–8.1)

## 2022-06-20 LAB — MAGNESIUM: Magnesium: 1.6 mg/dL — ABNORMAL LOW (ref 1.7–2.4)

## 2022-06-20 MED ORDER — POTASSIUM CHLORIDE CRYS ER 20 MEQ PO TBCR
40.0000 meq | EXTENDED_RELEASE_TABLET | ORAL | Status: DC
  Administered 2022-06-20: 40 meq via ORAL
  Filled 2022-06-20: qty 2

## 2022-06-20 MED ORDER — MAGNESIUM SULFATE 4 GM/100ML IV SOLN
4.0000 g | Freq: Once | INTRAVENOUS | Status: AC
  Administered 2022-06-20: 4 g via INTRAVENOUS
  Filled 2022-06-20: qty 100

## 2022-06-20 NOTE — TOC Progression Note (Signed)
Transition of Care [facility]) - Progression Note    Patient Details  Name: [name] MRN: [id number] Date of Birth: Jan 24, 1946  [facility] of [facility]) CM/SW Contact  Purcell Mouton, RN Phone Number: 06/20/2022, 8:49 AM  Clinical Narrative:    TOC  will continue to follow for discharge needs.    Expected Discharge Plan: [name] Barriers to Discharge: No Barriers Identified  Expected Discharge Plan and Services Expected Discharge Plan: [name] arrangements for the past 2 months: Single Family Home                                       Social Determinants of Health (SDOH) Interventions    Readmission Risk Interventions     No data to display

## 2022-06-20 NOTE — Progress Notes (Signed)
Discharge instructions discussed with patient and family, including cleaning of foley catheter.verbalized agreement and understanding

## 2022-06-20 NOTE — Discharge Summary (Signed)
Physician Discharge Summary  [name]:[id number] DOB: [birth year] DOA: 06/17/2022  PCP: Prince Solian, MD  Admit date: 06/17/2022 Discharge date: 06/20/2022 Discharging to: home Recommendations for Outpatient Follow-up:  F/u on thyroid function tests in 3-4 wks as Synthroid dose has been adjusted  Consults:  urology Procedures:  Foley cath   Discharge Diagnoses:   Principal Problem:   Acute metabolic encephalopathy Active Problems:   Hypercholesterolemia   HTN (hypertension)   Persistent atrial fibrillation   Hypothyroidism   Chronic diarrhea   Pyuria   GERD (gastroesophageal reflux disease)   Hyperglycemia   Acute urinary retention     Hospital Course:  This is a 76 year old female with atrial fibrillation, hypothyroidism and chronic diarrhea who presents to the hospital with complaints of abdominal pain.  According to the history obtained from her daughter, they found her very diaphoretic and confused yesterday.  She complained of severe abdominal pain.  While in the ED the patient continued to state that she had to urinate and despite having multiple episodes of incontinence in the bed she continued to have discomfort and agitation along with confusion.  She was given a dose of IV Ativan and was able to rest after that.  Principal Problem:   Acute metabolic encephalopathy -Suspect this was secondary to acute urinary retention   - she has also received Ativan during this hospital stay for agitation and may be confused from this - will hold off on further medications that alter sensorium - mental status returned back to baseline   - Active Problems:     Acute urinary retention-   Pyuria -UA performed in the ED showed a small amount of bacteria and WBCs - Ceftriaxone was started in the ED and was continued while we awaited urine culture - culture shows ~ 2000 Strep Viridans- dc antibiotics - she has urinary retention with overflow incontinence- urology has  been consulted and foley placed on 10/29 - she will need to dc home with this -  plan to f/u with Dr Diona Fanti in 2 wks   Poor oral intake - due to above- has improved significantly after mental status improved   Elevated T bili - T bili 2.1 - has normalized     HTN (hypertension)   Persistent atrial fibrillation -Continue apixaban and bisoprolol     Hypothyroidism -TSH slightly suppressed at 0.065-  free T4 is 1.37 - We will reduce Synthroid from 88 mcg to 75 mcg - Recommend follow-up as outpatient in 4 weeks     Chronic diarrhea -  no acute increase in diarrhea- - GI pathogen panel is pending- C diff neg   Hypokalemia and hypomagnesemia - replaced today            Discharge Instructions  Discharge Instructions     Increase activity slowly   Complete by: As directed       Allergies as of 06/20/2022       Reactions   Penicillins    Causes rash Has patient had a PCN reaction causing immediate rash, facial/tongue/throat swelling, SOB or lightheadedness with hypotension: YES Has patient had a PCN reaction causing severe rash involving mucus membranes or skin necrosis: No Has patient had a PCN reaction that required hospitalization No Has patient had a PCN reaction occurring within the last 10 years: No If all of the above answers are "NO", then may proceed with Cephalosporin use.        Medication List     TAKE these medications  acetaminophen 325 MG tablet Commonly known as: TYLENOL Take 2 tablets (650 mg total) by mouth every 8 (eight) hours.   apixaban 5 MG Tabs tablet Commonly known as: ELIQUIS Take 1 tablet (5 mg total) by mouth 2 (two) times daily. What changed: Another medication with the same name was removed. Continue taking this medication, and follow the directions you see here.   ascorbic acid 500 MG tablet Commonly known as: VITAMIN C Take 500 mg by mouth daily.   atorvastatin 10 MG tablet Commonly known as: LIPITOR Take 1 tablet  (10 mg total) by mouth daily.   bisoprolol 5 MG tablet Commonly known as: ZEBETA Take 0.5 tablets by mouth daily.   buPROPion 75 MG tablet Commonly known as: WELLBUTRIN Take 75 mg by mouth every morning.   diclofenac sodium 1 % Gel Commonly known as: VOLTAREN Apply 2 g topically 4 (four) times daily. What changed:  when to take this reasons to take this   fentaNYL 37.5 MCG/HR Pt72 Place 1 patch onto the skin every 3 (three) days. Apply 1 patch to the skin every 3 Three Days   FLUoxetine 20 MG capsule Commonly known as: PROZAC Take 1 capsule (20 mg total) by mouth at bedtime.   furosemide 20 MG tablet Commonly known as: Lasix Take 1 tablet (20 mg total) by mouth as needed.   gabapentin 600 MG tablet Commonly known as: NEURONTIN Take 600 mg by mouth 3 (three) times daily.   levothyroxine 88 MCG tablet Commonly known as: Synthroid Take 1 tablet (88 mcg total) by mouth daily before breakfast.   methenamine 1 g tablet Commonly known as: HIPREX Take 1 g by mouth 2 (two) times daily.   ondansetron 4 MG disintegrating tablet Commonly known as: Zofran ODT Take 1 tablet (4 mg total) by mouth every 8 (eight) hours as needed for nausea or vomiting.   pantoprazole 40 MG tablet Commonly known as: PROTONIX Take 1 tablet (40 mg total) by mouth 2 (two) times daily.   polyethylene glycol 17 g packet Commonly known as: MiraLax Take 8 g by mouth daily. Increase to twice a day if no BM by day 2 What changed:  when to take this reasons to take this additional instructions   Vitamin D 125 MCG (5000 UT) Caps Take 5,000 Units by mouth daily.            The results of significant diagnostics from this hospitalization (including imaging, microbiology, ancillary and laboratory) are listed below for reference.    CT ABDOMEN PELVIS WO CONTRAST  Result Date: 06/17/2022 CLINICAL DATA:  76 year old female with altered mental status, nausea, diarrhea, confusion. EXAM: CT ABDOMEN  AND PELVIS WITHOUT CONTRAST TECHNIQUE: Multidetector CT imaging of the abdomen and pelvis was performed following the standard protocol without IV contrast. RADIATION DOSE REDUCTION: This exam was performed according to the departmental dose-optimization program which includes automated exposure control, adjustment of the mA and/or kV according to patient size and/or use of iterative reconstruction technique. COMPARISON:  CT Abdomen and Pelvis 04/29/2021 and earlier. FINDINGS: Lower chest: Calcified coronary artery atherosclerosis. Mild cardiomegaly. No pericardial or pleural effusion. Lung base respiratory motion artifact. But improved ventilation compared to last year. No acute finding. Hepatobiliary: Streak artifact from left wrist Jewel hree or watch. Chronically absent gallbladder. Negative noncontrast liver. Pancreas: Negative. Spleen: Negative. Adrenals/Urinary Tract: No adrenal abnormality. Chronic solitary left kidney appears nonobstructed. No nephrolithiasis or pararenal inflammation. Left ureter appears decompressed. Chronic pelvic phleboliths. Unremarkable bladder. Stomach/Bowel: Redundant but decompressed large  bowel in the pelvis. Below average volume of retained stool in the colon. Fluid in the right colon and transverse. Cecum partially located in the pelvis. No large bowel inflammation identified. Appendix is not delineated. Terminal ileum is decompressed. No dilated small bowel. No free air free fluid. Stomach and duodenum are fairly decompressed. Vascular/Lymphatic: Extensive Aortoiliac calcified atherosclerosis. Vascular patency is not evaluated in the absence of IV contrast. Normal caliber abdominal aorta. No lymphadenopathy identified. Reproductive: Chronically absent uterus. Diminutive or absent ovaries. Other: No pelvic free fluid. Musculoskeletal: Chronic lumbar fusion and degeneration. Dextroconvex scoliosis appears stable. Chronic right hip arthroplasty. No acute osseous abnormality  identified. IMPRESSION: 1. Some fluid in the large bowel, which can be seen with enteritis/diarrhea. 2. But no discrete bowel inflammation and no other acute or inflammatory process identified in the non-contrast abdomen or pelvis. 3. Chronic solitary left kidney. Aortic Atherosclerosis (ICD10-I70.0). Electronically Signed   By: Genevie Ann M.D.   On: 06/17/2022 11:54   CT Head Wo Contrast  Result Date: 06/17/2022 CLINICAL DATA:  76 year old female with altered mental status, nausea, diarrhea, confusion. EXAM: CT HEAD WITHOUT CONTRAST TECHNIQUE: Contiguous axial images were obtained from the base of the skull through the vertex without intravenous contrast. RADIATION DOSE REDUCTION: This exam was performed according to the departmental dose-optimization program which includes automated exposure control, adjustment of the mA and/or kV according to patient size and/or use of iterative reconstruction technique. COMPARISON:  Brain MRI and head CT 05/27/2020. FINDINGS: Brain: Cerebral volume appears stable and within normal limits for age. No midline shift, ventriculomegaly, mass effect, evidence of mass lesion, intracranial hemorrhage or evidence of cortically based acute infarction. Gray-white matter differentiation appears stable and within normal limits for age. Vascular: Calcified atherosclerosis at the skull base. No suspicious intracranial vascular hyperdensity. Skull: No acute osseous abnormality identified. Sinuses/Orbits: Visualized paranasal sinuses and mastoids are clear. Other: No acute orbit or scalp soft tissue finding. IMPRESSION: Stable and normal for age noncontrast Head CT. Electronically Signed   By: Genevie Ann M.D.   On: 06/17/2022 11:48   Labs:   Basic Metabolic Panel: Recent Labs  Lab 06/17/22 1048 06/18/22 0449 06/19/22 0822 06/20/22 0443  NA 139 140 139 139  K 3.5 3.3* 3.5 3.3*  CL 103 107 106 106  CO2 '25 24 22 25  '$ GLUCOSE 180* 99 91 99  BUN '13 16 15 13  '$ CREATININE 0.92 0.83 0.77  0.74  CALCIUM 10.2 9.7 9.8 9.6  MG  --   --   --  1.6*     CBC: Recent Labs  Lab 06/17/22 1048 06/18/22 0449  WBC 5.7 7.6  HGB 14.8 13.2  HCT 46.1* 40.3  MCV 98.1 95.3  PLT 209 224         SIGNED:   Debbe Odea, MD  [facility] 06/20/2022, 11:36 AM

## 2022-06-23 LAB — GASTROINTESTINAL PANEL BY PCR, STOOL (REPLACES STOOL CULTURE)

## 2022-06-26 DIAGNOSIS — N3021 Other chronic cystitis with hematuria: Secondary | ICD-10-CM | POA: Diagnosis not present

## 2022-06-26 DIAGNOSIS — R3914 Feeling of incomplete bladder emptying: Secondary | ICD-10-CM | POA: Diagnosis not present

## 2022-06-26 DIAGNOSIS — N952 Postmenopausal atrophic vaginitis: Secondary | ICD-10-CM | POA: Diagnosis not present

## 2022-07-06 DIAGNOSIS — M5416 Radiculopathy, lumbar region: Secondary | ICD-10-CM | POA: Diagnosis not present

## 2022-07-06 DIAGNOSIS — M545 Low back pain, unspecified: Secondary | ICD-10-CM | POA: Diagnosis not present

## 2022-07-12 ENCOUNTER — Other Ambulatory Visit: Payer: Self-pay | Admitting: Rehabilitation

## 2022-07-12 DIAGNOSIS — M5416 Radiculopathy, lumbar region: Secondary | ICD-10-CM

## 2022-07-18 DIAGNOSIS — E785 Hyperlipidemia, unspecified: Secondary | ICD-10-CM | POA: Diagnosis not present

## 2022-07-18 DIAGNOSIS — E559 Vitamin D deficiency, unspecified: Secondary | ICD-10-CM | POA: Diagnosis not present

## 2022-07-18 DIAGNOSIS — E039 Hypothyroidism, unspecified: Secondary | ICD-10-CM | POA: Diagnosis not present

## 2022-07-18 DIAGNOSIS — Z23 Encounter for immunization: Secondary | ICD-10-CM | POA: Diagnosis not present

## 2022-07-24 DIAGNOSIS — M170 Bilateral primary osteoarthritis of knee: Secondary | ICD-10-CM | POA: Diagnosis not present

## 2022-08-09 DIAGNOSIS — E785 Hyperlipidemia, unspecified: Secondary | ICD-10-CM | POA: Diagnosis not present

## 2022-08-09 DIAGNOSIS — I10 Essential (primary) hypertension: Secondary | ICD-10-CM | POA: Diagnosis not present

## 2022-08-09 DIAGNOSIS — G609 Hereditary and idiopathic neuropathy, unspecified: Secondary | ICD-10-CM | POA: Diagnosis not present

## 2022-08-09 DIAGNOSIS — F1120 Opioid dependence, uncomplicated: Secondary | ICD-10-CM | POA: Diagnosis not present

## 2022-08-09 DIAGNOSIS — N1831 Chronic kidney disease, stage 3a: Secondary | ICD-10-CM | POA: Diagnosis not present

## 2022-08-09 DIAGNOSIS — I129 Hypertensive chronic kidney disease with stage 1 through stage 4 chronic kidney disease, or unspecified chronic kidney disease: Secondary | ICD-10-CM | POA: Diagnosis not present

## 2022-08-09 DIAGNOSIS — K219 Gastro-esophageal reflux disease without esophagitis: Secondary | ICD-10-CM | POA: Diagnosis not present

## 2022-08-09 DIAGNOSIS — Z0000 Encounter for general adult medical examination without abnormal findings: Secondary | ICD-10-CM | POA: Diagnosis not present

## 2022-08-09 DIAGNOSIS — I4891 Unspecified atrial fibrillation: Secondary | ICD-10-CM | POA: Diagnosis not present

## 2022-08-09 DIAGNOSIS — G9341 Metabolic encephalopathy: Secondary | ICD-10-CM | POA: Diagnosis not present

## 2022-08-16 ENCOUNTER — Other Ambulatory Visit: Payer: Medicare HMO

## 2022-08-24 DIAGNOSIS — G894 Chronic pain syndrome: Secondary | ICD-10-CM | POA: Diagnosis not present

## 2022-08-24 DIAGNOSIS — Z79891 Long term (current) use of opiate analgesic: Secondary | ICD-10-CM | POA: Diagnosis not present

## 2022-08-24 DIAGNOSIS — M5416 Radiculopathy, lumbar region: Secondary | ICD-10-CM | POA: Diagnosis not present

## 2022-08-24 DIAGNOSIS — Z79899 Other long term (current) drug therapy: Secondary | ICD-10-CM | POA: Diagnosis not present

## 2022-08-24 DIAGNOSIS — M5136 Other intervertebral disc degeneration, lumbar region: Secondary | ICD-10-CM | POA: Diagnosis not present

## 2022-09-03 ENCOUNTER — Other Ambulatory Visit: Payer: Medicare HMO

## 2022-09-11 ENCOUNTER — Ambulatory Visit
Admission: RE | Admit: 2022-09-11 | Discharge: 2022-09-11 | Disposition: A | Discharge status: Home / Self Care | Payer: Medicare HMO | Source: Ambulatory Visit | Attending: Rehabilitation | Admitting: Rehabilitation

## 2022-09-11 DIAGNOSIS — M5416 Radiculopathy, lumbar region: Secondary | ICD-10-CM

## 2022-09-11 DIAGNOSIS — M5117 Intervertebral disc disorders with radiculopathy, lumbosacral region: Secondary | ICD-10-CM | POA: Diagnosis not present

## 2022-09-11 DIAGNOSIS — M4726 Other spondylosis with radiculopathy, lumbar region: Secondary | ICD-10-CM | POA: Diagnosis not present

## 2022-09-11 MED ORDER — GADOPICLENOL 0.5 MMOL/ML IV SOLN
6.0000 mL | Freq: Once | INTRAVENOUS | Status: AC | PRN
  Administered 2022-09-11: 6 mL via INTRAVENOUS

## 2022-09-19 DIAGNOSIS — M5416 Radiculopathy, lumbar region: Secondary | ICD-10-CM | POA: Diagnosis not present

## 2022-09-25 DIAGNOSIS — N3000 Acute cystitis without hematuria: Secondary | ICD-10-CM | POA: Diagnosis not present

## 2022-09-25 DIAGNOSIS — N3021 Other chronic cystitis with hematuria: Secondary | ICD-10-CM | POA: Diagnosis not present

## 2022-09-25 DIAGNOSIS — R3914 Feeling of incomplete bladder emptying: Secondary | ICD-10-CM | POA: Diagnosis not present

## 2022-09-29 ENCOUNTER — Encounter: Payer: Self-pay | Admitting: *Deleted

## 2022-10-05 ENCOUNTER — Other Ambulatory Visit: Payer: Self-pay | Admitting: Internal Medicine

## 2022-10-05 DIAGNOSIS — R911 Solitary pulmonary nodule: Secondary | ICD-10-CM

## 2022-10-09 ENCOUNTER — Telehealth: Payer: Self-pay | Admitting: *Deleted

## 2022-10-09 DIAGNOSIS — R2681 Unsteadiness on feet: Secondary | ICD-10-CM | POA: Diagnosis not present

## 2022-10-09 DIAGNOSIS — I5030 Unspecified diastolic (congestive) heart failure: Secondary | ICD-10-CM | POA: Diagnosis not present

## 2022-10-09 DIAGNOSIS — Z9181 History of falling: Secondary | ICD-10-CM | POA: Diagnosis not present

## 2022-10-09 DIAGNOSIS — Z96641 Presence of right artificial hip joint: Secondary | ICD-10-CM | POA: Diagnosis not present

## 2022-10-09 DIAGNOSIS — R262 Difficulty in walking, not elsewhere classified: Secondary | ICD-10-CM | POA: Diagnosis not present

## 2022-10-09 DIAGNOSIS — R2689 Other abnormalities of gait and mobility: Secondary | ICD-10-CM | POA: Diagnosis not present

## 2022-10-09 DIAGNOSIS — J9601 Acute respiratory failure with hypoxia: Secondary | ICD-10-CM | POA: Diagnosis not present

## 2022-10-09 DIAGNOSIS — R531 Weakness: Secondary | ICD-10-CM | POA: Diagnosis not present

## 2022-10-09 DIAGNOSIS — M5412 Radiculopathy, cervical region: Secondary | ICD-10-CM | POA: Diagnosis not present

## 2022-10-09 NOTE — Telephone Encounter (Signed)
   Primary Cardiologist: Peter Martinique, MD  Chart reviewed as part of pre-operative protocol coverage. Given past medical history and time since last visit, based on ACC/AHA guidelines, [name] would be at acceptable risk for the planned procedure without further cardiovascular testing.   Patient with diagnosis of afib on Eliquis for anticoagulation.     Procedure: L5-S1 epidural injection Date of procedure: TBD   CHA2DS2-VASc Score = 8  This indicates a 10.8% annual risk of stroke. The patient's score is based upon: CHF History: 1 HTN History: 1 Diabetes History: 0 Stroke History: 2 (occular CVA in 2012, didn't start on anticoag until 2018 for afib dx) Vascular Disease History: 1 Age Score: 2 Gender Score: 1   Occular CVA in 2012. Not started on anticoag until Nov 2018 afib dx.   CrCl 83m/min Platelet count 224K   Per office protocol, patient can hold Eliquis for 3 days prior to procedure.  I will route this recommendation to the requesting party via Epic fax function and remove from pre-op pool.  Please call with questions.  MEmmaline Life NP-C  10/09/2022, 11:54 AM [address] Office [phone] [phone]

## 2022-10-09 NOTE — Telephone Encounter (Signed)
   Pre-operative Risk Assessment    Patient Name: [name]  DOB: [birth year] MRN: [id number]      Request for Surgical Clearance    Procedure:   L5-S1 Epidural injection  Date of Surgery:  Clearance TBD                                 Surgeon:  Dr. Zonia Kief Surgeon's Group or Practice Name:  [facility] Phone number:  [phone] Fax number:   [phone]   Type of Clearance Requested:   - Pharmacy:  Hold Apixaban (Eliquis)     Type of Anesthesia:     Additional requests/questions:    Hervey Ard   10/09/2022, 10:13 AM

## 2022-10-09 NOTE — Telephone Encounter (Signed)
Patient with diagnosis of afib on Eliquis for anticoagulation.    Procedure: L5-S1 epidural injection Date of procedure: TBD  CHA2DS2-VASc Score = 8  This indicates a 10.8% annual risk of stroke. The patient's score is based upon: CHF History: 1 HTN History: 1 Diabetes History: 0 Stroke History: 2 (occular CVA in 2012, didn't start on anticoag until 2018 for afib dx) Vascular Disease History: 1 Age Score: 2 Gender Score: 1   Occular CVA in 2012. Not started on anticoag until Nov 2018 afib dx.  CrCl 19m/min Platelet count 224K  Per office protocol, patient can hold Eliquis for 3 days prior to procedure.    **This guidance is not considered finalized until pre-operative APP has relayed final recommendations.**

## 2022-10-11 DIAGNOSIS — N3021 Other chronic cystitis with hematuria: Secondary | ICD-10-CM | POA: Diagnosis not present

## 2022-10-27 ENCOUNTER — Ambulatory Visit: Payer: Medicare HMO | Admitting: Internal Medicine

## 2022-10-27 DIAGNOSIS — N3021 Other chronic cystitis with hematuria: Secondary | ICD-10-CM | POA: Diagnosis not present

## 2022-11-09 DIAGNOSIS — M5416 Radiculopathy, lumbar region: Secondary | ICD-10-CM | POA: Diagnosis not present

## 2022-11-09 DIAGNOSIS — M5136 Other intervertebral disc degeneration, lumbar region: Secondary | ICD-10-CM | POA: Diagnosis not present

## 2022-11-23 DIAGNOSIS — R8271 Bacteriuria: Secondary | ICD-10-CM | POA: Diagnosis not present

## 2022-11-23 DIAGNOSIS — R310 Gross hematuria: Secondary | ICD-10-CM | POA: Diagnosis not present

## 2022-11-23 DIAGNOSIS — N3021 Other chronic cystitis with hematuria: Secondary | ICD-10-CM | POA: Diagnosis not present

## 2022-11-28 DIAGNOSIS — M170 Bilateral primary osteoarthritis of knee: Secondary | ICD-10-CM | POA: Diagnosis not present

## 2022-11-29 ENCOUNTER — Ambulatory Visit: Payer: Medicare HMO | Admitting: Physician Assistant

## 2022-12-01 DIAGNOSIS — M170 Bilateral primary osteoarthritis of knee: Secondary | ICD-10-CM | POA: Diagnosis not present

## 2022-12-05 ENCOUNTER — Encounter (HOSPITAL_COMMUNITY): Payer: Self-pay

## 2022-12-05 ENCOUNTER — Emergency Department (HOSPITAL_COMMUNITY)
Admission: EM | Admit: 2022-12-05 | Discharge: 2022-12-05 | Disposition: A | Discharge status: Home / Self Care | Payer: Medicare HMO | Source: Home / Self Care | Attending: Emergency Medicine | Admitting: Emergency Medicine

## 2022-12-05 DIAGNOSIS — E876 Hypokalemia: Secondary | ICD-10-CM | POA: Diagnosis present

## 2022-12-05 DIAGNOSIS — A0472 Enterocolitis due to Clostridium difficile, not specified as recurrent: Secondary | ICD-10-CM | POA: Diagnosis present

## 2022-12-05 DIAGNOSIS — R110 Nausea: Secondary | ICD-10-CM | POA: Diagnosis not present

## 2022-12-05 DIAGNOSIS — E873 Alkalosis: Secondary | ICD-10-CM | POA: Diagnosis present

## 2022-12-05 DIAGNOSIS — G319 Degenerative disease of nervous system, unspecified: Secondary | ICD-10-CM | POA: Diagnosis not present

## 2022-12-05 DIAGNOSIS — N3000 Acute cystitis without hematuria: Secondary | ICD-10-CM | POA: Diagnosis not present

## 2022-12-05 DIAGNOSIS — J449 Chronic obstructive pulmonary disease, unspecified: Secondary | ICD-10-CM | POA: Insufficient documentation

## 2022-12-05 DIAGNOSIS — G928 Other toxic encephalopathy: Secondary | ICD-10-CM | POA: Diagnosis present

## 2022-12-05 DIAGNOSIS — E039 Hypothyroidism, unspecified: Secondary | ICD-10-CM | POA: Insufficient documentation

## 2022-12-05 DIAGNOSIS — I700 Atherosclerosis of aorta: Secondary | ICD-10-CM | POA: Diagnosis present

## 2022-12-05 DIAGNOSIS — I10 Essential (primary) hypertension: Secondary | ICD-10-CM | POA: Diagnosis present

## 2022-12-05 DIAGNOSIS — Z7901 Long term (current) use of anticoagulants: Secondary | ICD-10-CM | POA: Insufficient documentation

## 2022-12-05 DIAGNOSIS — Z79899 Other long term (current) drug therapy: Secondary | ICD-10-CM | POA: Insufficient documentation

## 2022-12-05 DIAGNOSIS — R5381 Other malaise: Secondary | ICD-10-CM | POA: Diagnosis not present

## 2022-12-05 DIAGNOSIS — E7800 Pure hypercholesterolemia, unspecified: Secondary | ICD-10-CM | POA: Diagnosis present

## 2022-12-05 DIAGNOSIS — R8281 Pyuria: Secondary | ICD-10-CM | POA: Diagnosis not present

## 2022-12-05 DIAGNOSIS — Z681 Body mass index (BMI) 19 or less, adult: Secondary | ICD-10-CM | POA: Diagnosis not present

## 2022-12-05 DIAGNOSIS — Z1152 Encounter for screening for COVID-19: Secondary | ICD-10-CM | POA: Diagnosis not present

## 2022-12-05 DIAGNOSIS — E861 Hypovolemia: Secondary | ICD-10-CM | POA: Diagnosis present

## 2022-12-05 DIAGNOSIS — R109 Unspecified abdominal pain: Secondary | ICD-10-CM | POA: Diagnosis not present

## 2022-12-05 DIAGNOSIS — A498 Other bacterial infections of unspecified site: Secondary | ICD-10-CM | POA: Diagnosis not present

## 2022-12-05 DIAGNOSIS — N390 Urinary tract infection, site not specified: Secondary | ICD-10-CM | POA: Diagnosis not present

## 2022-12-05 DIAGNOSIS — R627 Adult failure to thrive: Secondary | ICD-10-CM | POA: Diagnosis present

## 2022-12-05 DIAGNOSIS — E871 Hypo-osmolality and hyponatremia: Secondary | ICD-10-CM | POA: Diagnosis present

## 2022-12-05 DIAGNOSIS — R29810 Facial weakness: Secondary | ICD-10-CM | POA: Diagnosis not present

## 2022-12-05 DIAGNOSIS — Q453 Other congenital malformations of pancreas and pancreatic duct: Secondary | ICD-10-CM | POA: Diagnosis not present

## 2022-12-05 DIAGNOSIS — K8689 Other specified diseases of pancreas: Secondary | ICD-10-CM | POA: Diagnosis present

## 2022-12-05 DIAGNOSIS — I4819 Other persistent atrial fibrillation: Secondary | ICD-10-CM | POA: Diagnosis present

## 2022-12-05 DIAGNOSIS — D751 Secondary polycythemia: Secondary | ICD-10-CM | POA: Diagnosis present

## 2022-12-05 DIAGNOSIS — R197 Diarrhea, unspecified: Secondary | ICD-10-CM | POA: Diagnosis not present

## 2022-12-05 DIAGNOSIS — N3090 Cystitis, unspecified without hematuria: Secondary | ICD-10-CM | POA: Insufficient documentation

## 2022-12-05 DIAGNOSIS — I69398 Other sequelae of cerebral infarction: Secondary | ICD-10-CM | POA: Diagnosis not present

## 2022-12-05 DIAGNOSIS — R4182 Altered mental status, unspecified: Secondary | ICD-10-CM | POA: Diagnosis not present

## 2022-12-05 DIAGNOSIS — F329 Major depressive disorder, single episode, unspecified: Secondary | ICD-10-CM | POA: Diagnosis present

## 2022-12-05 DIAGNOSIS — R1084 Generalized abdominal pain: Secondary | ICD-10-CM | POA: Diagnosis present

## 2022-12-05 DIAGNOSIS — M549 Dorsalgia, unspecified: Secondary | ICD-10-CM | POA: Diagnosis not present

## 2022-12-05 DIAGNOSIS — H538 Other visual disturbances: Secondary | ICD-10-CM | POA: Diagnosis not present

## 2022-12-05 DIAGNOSIS — E440 Moderate protein-calorie malnutrition: Secondary | ICD-10-CM | POA: Diagnosis present

## 2022-12-05 DIAGNOSIS — R1110 Vomiting, unspecified: Secondary | ICD-10-CM | POA: Diagnosis not present

## 2022-12-05 DIAGNOSIS — G9341 Metabolic encephalopathy: Secondary | ICD-10-CM | POA: Diagnosis not present

## 2022-12-05 DIAGNOSIS — R569 Unspecified convulsions: Secondary | ICD-10-CM | POA: Diagnosis not present

## 2022-12-05 DIAGNOSIS — R112 Nausea with vomiting, unspecified: Secondary | ICD-10-CM | POA: Diagnosis not present

## 2022-12-05 DIAGNOSIS — I722 Aneurysm of renal artery: Secondary | ICD-10-CM | POA: Diagnosis present

## 2022-12-05 DIAGNOSIS — R231 Pallor: Secondary | ICD-10-CM | POA: Diagnosis not present

## 2022-12-05 DIAGNOSIS — F419 Anxiety disorder, unspecified: Secondary | ICD-10-CM | POA: Diagnosis present

## 2022-12-05 DIAGNOSIS — E860 Dehydration: Secondary | ICD-10-CM | POA: Diagnosis present

## 2022-12-05 LAB — URINALYSIS, ROUTINE W REFLEX MICROSCOPIC
Bacteria, UA: NONE SEEN
Bilirubin Urine: NEGATIVE
Glucose, UA: NEGATIVE mg/dL
Ketones, ur: 5 mg/dL — AB
Nitrite: NEGATIVE
Protein, ur: NEGATIVE mg/dL
Specific Gravity, Urine: 1.011 (ref 1.005–1.030)
WBC, UA: >50 WBC/hpf (ref 0–5)
pH: 8 (ref 5.0–8.0)

## 2022-12-05 LAB — BASIC METABOLIC PANEL WITH GFR
Anion gap: 7 (ref 5–15)
BUN: 16 mg/dL (ref 8–23)
CO2: 27 mmol/L (ref 22–32)
Calcium: 9.7 mg/dL (ref 8.9–10.3)
Chloride: 102 mmol/L (ref 98–111)
Creatinine, Ser: 0.97 mg/dL (ref 0.44–1.00)
GFR, Estimated: >60 mL/min
Glucose, Bld: 126 mg/dL — ABNORMAL HIGH (ref 70–99)
Potassium: 3.3 mmol/L — ABNORMAL LOW (ref 3.5–5.1)
Sodium: 136 mmol/L (ref 135–145)

## 2022-12-05 LAB — LIPASE, BLOOD: Lipase: 24 U/L (ref 11–51)

## 2022-12-05 LAB — CBC
HCT: 39.5 % (ref 36.0–46.0)
Hemoglobin: 13.1 g/dL (ref 12.0–15.0)
MCH: 31.2 pg (ref 26.0–34.0)
MCHC: 33.2 g/dL (ref 30.0–36.0)
MCV: 94 fL (ref 80.0–100.0)
Platelets: 241 10*3/uL (ref 150–400)
RBC: 4.2 MIL/uL (ref 3.87–5.11)
RDW: 12.3 % (ref 11.5–15.5)
WBC: 6.5 10*3/uL (ref 4.0–10.5)
nRBC: 0 % (ref 0.0–0.2)

## 2022-12-05 MED ORDER — ACETAMINOPHEN 500 MG PO TABS
1000.0000 mg | ORAL_TABLET | Freq: Once | ORAL | Status: AC
  Administered 2022-12-05: 1000 mg via ORAL
  Filled 2022-12-05: qty 2

## 2022-12-05 MED ORDER — CEFPODOXIME PROXETIL 200 MG PO TABS: 200.0000 mg | ORAL_TABLET | Freq: Two times a day (BID) | ORAL | 0 refills | Status: DC

## 2022-12-05 MED ORDER — POTASSIUM CHLORIDE CRYS ER 20 MEQ PO TBCR
20.0000 meq | EXTENDED_RELEASE_TABLET | Freq: Once | ORAL | Status: AC
  Administered 2022-12-05: 20 meq via ORAL
  Filled 2022-12-05: qty 1

## 2022-12-05 MED ORDER — SODIUM CHLORIDE 0.9 % IV SOLN
1.0000 g | Freq: Once | INTRAVENOUS | Status: AC
  Administered 2022-12-05: 1 g via INTRAVENOUS
  Filled 2022-12-05: qty 10

## 2022-12-05 MED ORDER — SODIUM CHLORIDE 0.9 % IV BOLUS
500.0000 mL | Freq: Once | INTRAVENOUS | Status: AC
  Administered 2022-12-05: 500 mL via INTRAVENOUS

## 2022-12-05 NOTE — Discharge Instructions (Addendum)
Thank you for coming to [facility] Emergency Department. You were seen for abdominal cramping, burning with urination, nausea. We did an exam, labs, and imaging, and these showed a urinary tract infection.  We treated you with an antibiotic through the IV here in the emergency department we will discharge you with an antibiotic called cefpodoxime to take twice per day for 7 days. Please use your nausea medicine at home to ensure you stay well hydrated.  If your symptoms do not improve in 2-3 days or get worse, please return to the ED.   Please follow up with your primary care provider within 1 week.   Do not hesitate to return to the ED or call 911 if you experience: -Worsening symptoms -Nausea/vomiting so severe you cannot eat/drink anything -Lightheadedness, passing out -Fevers/chills -Anything else that concerns you
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
## 2022-12-05 NOTE — ED Notes (Signed)
Assisted pt onto bedpan. Pt had been hollering from room. Pt reminded how to use call light

## 2022-12-05 NOTE — ED Triage Notes (Addendum)
Patient coming from home via EMS with c/o dysuria, lower abdominal pain, urinary frequency, and nausea x5 days. Per pt, recently had UTI that she was treated for. Hx afib, HTN.   zofran IV with EMS

## 2022-12-06 LAB — URINE CULTURE: Culture: NO GROWTH

## 2022-12-07 ENCOUNTER — Inpatient Hospital Stay (HOSPITAL_COMMUNITY)
Admission: EM | Admit: 2022-12-07 | Discharge: 2022-12-15 | DRG: 371 | Disposition: A | Discharge status: Home Health | Payer: Medicare HMO | Attending: Internal Medicine | Admitting: Internal Medicine

## 2022-12-07 ENCOUNTER — Emergency Department (HOSPITAL_COMMUNITY): Payer: Medicare HMO

## 2022-12-07 DIAGNOSIS — G928 Other toxic encephalopathy: Secondary | ICD-10-CM | POA: Diagnosis present

## 2022-12-07 DIAGNOSIS — Z79891 Long term (current) use of opiate analgesic: Secondary | ICD-10-CM

## 2022-12-07 DIAGNOSIS — Z823 Family history of stroke: Secondary | ICD-10-CM

## 2022-12-07 DIAGNOSIS — F419 Anxiety disorder, unspecified: Secondary | ICD-10-CM | POA: Diagnosis present

## 2022-12-07 DIAGNOSIS — E440 Moderate protein-calorie malnutrition: Secondary | ICD-10-CM | POA: Insufficient documentation

## 2022-12-07 DIAGNOSIS — Z905 Acquired absence of kidney: Secondary | ICD-10-CM

## 2022-12-07 DIAGNOSIS — J449 Chronic obstructive pulmonary disease, unspecified: Secondary | ICD-10-CM | POA: Diagnosis present

## 2022-12-07 DIAGNOSIS — R54 Age-related physical debility: Secondary | ICD-10-CM | POA: Diagnosis present

## 2022-12-07 DIAGNOSIS — M159 Polyosteoarthritis, unspecified: Secondary | ICD-10-CM | POA: Diagnosis present

## 2022-12-07 DIAGNOSIS — E876 Hypokalemia: Secondary | ICD-10-CM | POA: Diagnosis not present

## 2022-12-07 DIAGNOSIS — Z1152 Encounter for screening for COVID-19: Secondary | ICD-10-CM

## 2022-12-07 DIAGNOSIS — Z79890 Hormone replacement therapy (postmenopausal): Secondary | ICD-10-CM

## 2022-12-07 DIAGNOSIS — G9341 Metabolic encephalopathy: Secondary | ICD-10-CM | POA: Diagnosis present

## 2022-12-07 DIAGNOSIS — Z818 Family history of other mental and behavioral disorders: Secondary | ICD-10-CM

## 2022-12-07 DIAGNOSIS — E861 Hypovolemia: Secondary | ICD-10-CM | POA: Diagnosis present

## 2022-12-07 DIAGNOSIS — I4819 Other persistent atrial fibrillation: Secondary | ICD-10-CM | POA: Diagnosis not present

## 2022-12-07 DIAGNOSIS — I69398 Other sequelae of cerebral infarction: Secondary | ICD-10-CM

## 2022-12-07 DIAGNOSIS — I722 Aneurysm of renal artery: Secondary | ICD-10-CM | POA: Diagnosis present

## 2022-12-07 DIAGNOSIS — M549 Dorsalgia, unspecified: Secondary | ICD-10-CM | POA: Diagnosis not present

## 2022-12-07 DIAGNOSIS — H5340 Unspecified visual field defects: Secondary | ICD-10-CM | POA: Diagnosis present

## 2022-12-07 DIAGNOSIS — K224 Dyskinesia of esophagus: Secondary | ICD-10-CM | POA: Diagnosis present

## 2022-12-07 DIAGNOSIS — H5461 Unqualified visual loss, right eye, normal vision left eye: Secondary | ICD-10-CM | POA: Diagnosis present

## 2022-12-07 DIAGNOSIS — Z681 Body mass index (BMI) 19 or less, adult: Secondary | ICD-10-CM

## 2022-12-07 DIAGNOSIS — E873 Alkalosis: Secondary | ICD-10-CM | POA: Diagnosis present

## 2022-12-07 DIAGNOSIS — E559 Vitamin D deficiency, unspecified: Secondary | ICD-10-CM | POA: Diagnosis present

## 2022-12-07 DIAGNOSIS — M545 Low back pain, unspecified: Secondary | ICD-10-CM | POA: Diagnosis present

## 2022-12-07 DIAGNOSIS — K8689 Other specified diseases of pancreas: Secondary | ICD-10-CM | POA: Diagnosis present

## 2022-12-07 DIAGNOSIS — R5381 Other malaise: Secondary | ICD-10-CM | POA: Diagnosis not present

## 2022-12-07 DIAGNOSIS — R29810 Facial weakness: Secondary | ICD-10-CM | POA: Diagnosis not present

## 2022-12-07 DIAGNOSIS — E039 Hypothyroidism, unspecified: Secondary | ICD-10-CM | POA: Diagnosis not present

## 2022-12-07 DIAGNOSIS — Q453 Other congenital malformations of pancreas and pancreatic duct: Secondary | ICD-10-CM

## 2022-12-07 DIAGNOSIS — R112 Nausea with vomiting, unspecified: Secondary | ICD-10-CM | POA: Diagnosis present

## 2022-12-07 DIAGNOSIS — I700 Atherosclerosis of aorta: Secondary | ICD-10-CM | POA: Diagnosis present

## 2022-12-07 DIAGNOSIS — R8281 Pyuria: Secondary | ICD-10-CM | POA: Diagnosis not present

## 2022-12-07 DIAGNOSIS — K589 Irritable bowel syndrome without diarrhea: Secondary | ICD-10-CM | POA: Diagnosis present

## 2022-12-07 DIAGNOSIS — I10 Essential (primary) hypertension: Secondary | ICD-10-CM | POA: Diagnosis present

## 2022-12-07 DIAGNOSIS — A0472 Enterocolitis due to Clostridium difficile, not specified as recurrent: Principal | ICD-10-CM | POA: Diagnosis present

## 2022-12-07 DIAGNOSIS — R1084 Generalized abdominal pain: Secondary | ICD-10-CM | POA: Diagnosis not present

## 2022-12-07 DIAGNOSIS — E871 Hypo-osmolality and hyponatremia: Secondary | ICD-10-CM | POA: Diagnosis not present

## 2022-12-07 DIAGNOSIS — K219 Gastro-esophageal reflux disease without esophagitis: Secondary | ICD-10-CM | POA: Diagnosis present

## 2022-12-07 DIAGNOSIS — Z79899 Other long term (current) drug therapy: Secondary | ICD-10-CM

## 2022-12-07 DIAGNOSIS — Z8249 Family history of ischemic heart disease and other diseases of the circulatory system: Secondary | ICD-10-CM

## 2022-12-07 DIAGNOSIS — E7800 Pure hypercholesterolemia, unspecified: Secondary | ICD-10-CM | POA: Diagnosis present

## 2022-12-07 DIAGNOSIS — Z96641 Presence of right artificial hip joint: Secondary | ICD-10-CM | POA: Diagnosis present

## 2022-12-07 DIAGNOSIS — R109 Unspecified abdominal pain: Secondary | ICD-10-CM | POA: Diagnosis not present

## 2022-12-07 DIAGNOSIS — Z7901 Long term (current) use of anticoagulants: Secondary | ICD-10-CM

## 2022-12-07 DIAGNOSIS — Z792 Long term (current) use of antibiotics: Secondary | ICD-10-CM

## 2022-12-07 DIAGNOSIS — Z87440 Personal history of urinary (tract) infections: Secondary | ICD-10-CM

## 2022-12-07 DIAGNOSIS — Z90710 Acquired absence of both cervix and uterus: Secondary | ICD-10-CM

## 2022-12-07 DIAGNOSIS — G894 Chronic pain syndrome: Secondary | ICD-10-CM | POA: Diagnosis present

## 2022-12-07 DIAGNOSIS — E860 Dehydration: Secondary | ICD-10-CM | POA: Diagnosis present

## 2022-12-07 DIAGNOSIS — R627 Adult failure to thrive: Secondary | ICD-10-CM | POA: Diagnosis present

## 2022-12-07 DIAGNOSIS — Z87891 Personal history of nicotine dependence: Secondary | ICD-10-CM

## 2022-12-07 DIAGNOSIS — Z8719 Personal history of other diseases of the digestive system: Secondary | ICD-10-CM

## 2022-12-07 DIAGNOSIS — D751 Secondary polycythemia: Secondary | ICD-10-CM | POA: Diagnosis present

## 2022-12-07 DIAGNOSIS — T40605A Adverse effect of unspecified narcotics, initial encounter: Secondary | ICD-10-CM | POA: Diagnosis present

## 2022-12-07 DIAGNOSIS — F329 Major depressive disorder, single episode, unspecified: Secondary | ICD-10-CM | POA: Diagnosis present

## 2022-12-07 DIAGNOSIS — N3021 Other chronic cystitis with hematuria: Secondary | ICD-10-CM | POA: Diagnosis present

## 2022-12-07 DIAGNOSIS — I252 Old myocardial infarction: Secondary | ICD-10-CM

## 2022-12-07 DIAGNOSIS — M810 Age-related osteoporosis without current pathological fracture: Secondary | ICD-10-CM | POA: Diagnosis present

## 2022-12-07 DIAGNOSIS — A498 Other bacterial infections of unspecified site: Secondary | ICD-10-CM

## 2022-12-07 DIAGNOSIS — M479 Spondylosis, unspecified: Secondary | ICD-10-CM | POA: Diagnosis present

## 2022-12-07 DIAGNOSIS — R1110 Vomiting, unspecified: Secondary | ICD-10-CM | POA: Diagnosis not present

## 2022-12-07 DIAGNOSIS — Z9181 History of falling: Secondary | ICD-10-CM

## 2022-12-07 DIAGNOSIS — Z880 Allergy status to penicillin: Secondary | ICD-10-CM

## 2022-12-07 LAB — CBC WITH DIFFERENTIAL/PLATELET
Abs Immature Granulocytes: 0.06 10*3/uL (ref 0.00–0.07)
Basophils Absolute: 0 10*3/uL (ref 0.0–0.1)
Basophils Relative: 0 %
Eosinophils Absolute: 0 10*3/uL (ref 0.0–0.5)
Eosinophils Relative: 0 %
HCT: 45 % (ref 36.0–46.0)
Hemoglobin: 15.5 g/dL — ABNORMAL HIGH (ref 12.0–15.0)
Immature Granulocytes: 1 %
Lymphocytes Relative: 11 %
Lymphs Abs: 1.2 10*3/uL (ref 0.7–4.0)
MCH: 30.8 pg (ref 26.0–34.0)
MCHC: 34.4 g/dL (ref 30.0–36.0)
MCV: 89.5 fL (ref 80.0–100.0)
Monocytes Absolute: 0.6 10*3/uL (ref 0.1–1.0)
Monocytes Relative: 6 %
Neutro Abs: 9.4 10*3/uL — ABNORMAL HIGH (ref 1.7–7.7)
Neutrophils Relative %: 82 %
Platelets: 362 10*3/uL (ref 150–400)
RBC: 5.03 MIL/uL (ref 3.87–5.11)
RDW: 12.3 % (ref 11.5–15.5)
WBC: 11.3 10*3/uL — ABNORMAL HIGH (ref 4.0–10.5)
nRBC: 0 % (ref 0.0–0.2)

## 2022-12-07 LAB — URINALYSIS, ROUTINE W REFLEX MICROSCOPIC
Bacteria, UA: NONE SEEN
Bilirubin Urine: NEGATIVE
Glucose, UA: NEGATIVE mg/dL
Ketones, ur: 20 mg/dL — AB
Nitrite: NEGATIVE
Protein, ur: 100 mg/dL — AB
RBC / HPF: >50 RBC/hpf (ref 0–5)
Specific Gravity, Urine: 1.019 (ref 1.005–1.030)
WBC, UA: >50 WBC/hpf (ref 0–5)
pH: 5 (ref 5.0–8.0)

## 2022-12-07 LAB — COMPREHENSIVE METABOLIC PANEL
ALT: 13 U/L (ref 0–44)
AST: 35 U/L (ref 15–41)
Albumin: 4.6 g/dL (ref 3.5–5.0)
Alkaline Phosphatase: 64 U/L (ref 38–126)
Anion gap: 11 (ref 5–15)
BUN: 17 mg/dL (ref 8–23)
CO2: 24 mmol/L (ref 22–32)
Calcium: 10.3 mg/dL (ref 8.9–10.3)
Chloride: 97 mmol/L — ABNORMAL LOW (ref 98–111)
Creatinine, Ser: 0.9 mg/dL (ref 0.44–1.00)
GFR, Estimated: >60 mL/min (ref >60)
Glucose, Bld: 118 mg/dL — ABNORMAL HIGH (ref 70–99)
Potassium: 3.3 mmol/L — ABNORMAL LOW (ref 3.5–5.1)
Sodium: 132 mmol/L — ABNORMAL LOW (ref 135–145)
Total Bilirubin: 2 mg/dL — ABNORMAL HIGH (ref 0.3–1.2)
Total Protein: 7.9 g/dL (ref 6.5–8.1)

## 2022-12-07 LAB — LIPASE, BLOOD: Lipase: 35 U/L (ref 11–51)

## 2022-12-07 LAB — SARS CORONAVIRUS 2 BY RT PCR: SARS Coronavirus 2 by RT PCR: NEGATIVE

## 2022-12-07 MED ORDER — SODIUM CHLORIDE 0.9 % IV SOLN: INTRAVENOUS | Status: DC

## 2022-12-07 MED ORDER — SODIUM CHLORIDE 0.9 % IV BOLUS
500.0000 mL | Freq: Once | INTRAVENOUS | Status: AC
  Administered 2022-12-07: 500 mL via INTRAVENOUS

## 2022-12-07 MED ORDER — CIPROFLOXACIN IN D5W 400 MG/200ML IV SOLN
400.0000 mg | Freq: Two times a day (BID) | INTRAVENOUS | Status: AC
  Administered 2022-12-07 – 2022-12-08 (×2): 400 mg via INTRAVENOUS
  Filled 2022-12-07 (×2): qty 200

## 2022-12-07 MED ORDER — ONDANSETRON HCL 4 MG/2ML IJ SOLN
4.0000 mg | Freq: Once | INTRAMUSCULAR | Status: AC
  Administered 2022-12-07: 4 mg via INTRAVENOUS
  Filled 2022-12-07: qty 2

## 2022-12-07 MED ORDER — BISOPROLOL FUMARATE 5 MG PO TABS
2.5000 mg | ORAL_TABLET | Freq: Every day | ORAL | Status: DC
  Administered 2022-12-08 – 2022-12-09 (×2): 2.5 mg via ORAL
  Filled 2022-12-07 (×2): qty 1

## 2022-12-07 MED ORDER — APIXABAN 5 MG PO TABS
5.0000 mg | ORAL_TABLET | Freq: Two times a day (BID) | ORAL | Status: DC
  Administered 2022-12-07 – 2022-12-15 (×14): 5 mg via ORAL
  Filled 2022-12-07 (×16): qty 1

## 2022-12-07 MED ORDER — FENTANYL CITRATE PF 50 MCG/ML IJ SOSY
25.0000 ug | PREFILLED_SYRINGE | Freq: Once | INTRAMUSCULAR | Status: AC
  Administered 2022-12-07: 25 ug via INTRAVENOUS
  Filled 2022-12-07: qty 1

## 2022-12-07 MED ORDER — IOHEXOL 300 MG/ML  SOLN
80.0000 mL | Freq: Once | INTRAMUSCULAR | Status: AC | PRN
  Administered 2022-12-07: 80 mL via INTRAVENOUS

## 2022-12-07 MED ORDER — POTASSIUM CHLORIDE CRYS ER 20 MEQ PO TBCR
30.0000 meq | EXTENDED_RELEASE_TABLET | Freq: Once | ORAL | Status: AC
  Administered 2022-12-07: 30 meq via ORAL
  Filled 2022-12-07: qty 1

## 2022-12-07 MED ORDER — LEVOTHYROXINE SODIUM 88 MCG PO TABS
88.0000 ug | ORAL_TABLET | Freq: Every day | ORAL | Status: DC
  Administered 2022-12-08 – 2022-12-09 (×2): 88 ug via ORAL
  Filled 2022-12-07 (×2): qty 1

## 2022-12-07 MED ORDER — ONDANSETRON HCL 4 MG PO TABS: 4.0000 mg | ORAL_TABLET | Freq: Four times a day (QID) | ORAL | 0 refills | Status: DC

## 2022-12-07 MED ORDER — SODIUM CHLORIDE 0.9 % IV SOLN
12.5000 mg | Freq: Four times a day (QID) | INTRAVENOUS | Status: DC | PRN
  Administered 2022-12-07 – 2022-12-09 (×5): 12.5 mg via INTRAVENOUS
  Filled 2022-12-07 (×3): qty 0.5
  Filled 2022-12-07 (×3): qty 12.5
  Filled 2022-12-07: qty 0.5
  Filled 2022-12-07: qty 12.5
  Filled 2022-12-07: qty 0.5

## 2022-12-07 NOTE — Assessment & Plan Note (Signed)
-  controlled -continue bisoprolol

## 2022-12-07 NOTE — Assessment & Plan Note (Signed)
-  hypovolemic. Continuous IV fluid overnight.

## 2022-12-07 NOTE — Assessment & Plan Note (Signed)
Repleted with oral potassium 

## 2022-12-07 NOTE — Assessment & Plan Note (Signed)
-  continue Eliquis and bisoprolol

## 2022-12-07 NOTE — ED Triage Notes (Signed)
BIB EMS from home for diagnosed UTI, seen here 2 days ago and complains that she is not getting better. Intermittent vomiting and EMS states pt has thrown up antibiotics at home a few times. Otherwise, pt has not missed any doses

## 2022-12-07 NOTE — Assessment & Plan Note (Signed)
-  unclear etiology. Questionable UTI although urine cultures negative 2 days ago. Complex case follow by urology and will need consult. CT A/P negative.  Will check TSH with her hypothyroidism.  -PRN IV antiemetics -continuous IV fluids

## 2022-12-07 NOTE — ED Provider Notes (Addendum)
I provided a substantive portion of the care of this patient.  I personally made/approved the management plan for this patient and take responsibility for the patient management.   77 year old female who presents with abdominal discomfort.  Seen at outside facility for same.  That workup showed possible UTI.  Her urine culture is negative for growth.  Workup here including CT scan is negative.  She improved with fentanyl.  Abdominal exam is benign at this point.  Does have a history of chronic pain and suspect there is some component of this.  Patient request to go home at this time   Lorre Nick, MD 12/07/22 1547    Lorre Nick, MD 12/07/22 [phone]

## 2022-12-07 NOTE — Discharge Instructions (Addendum)
Today for stomach pain, you still have evidence of UTI on urinalysis.  Please continue taking your antibiotics.  We repleted your potassium and gave you IV fluids.  Patient to drink plenty of fluids and come back for new or worsening symptoms.   High-Calorie, High-Protein Nutrition Therapy (2021) A high-calorie, high-protein diet has been recommended to you. Your registered dietitian nutritionist (RDN) may have recommended this diet because you are having difficulty eating enough calories throughout the day, you have lost weight, and/or you need to add protein to your diet. Sometimes you may not feel like eating, even if you know the importance of good nutrition. The recommendations in this handout can help you with the following: Regaining your strength and energy Keeping your body healthy Healing and recovering from surgery or illness and fighting infection Tips: Schedule Your Meals and Snacks Several small meals and snacks are often better tolerated and digested than large meals. Strategies Plan to eat 3 meals and 3 snacks daily. Experiment with timing meals to find out when you have a larger appetite. Appetite may be greatest in the morning after not eating all night so you may prefer to eat your larger meals and snacks in the morning and at lunch. Breakfast-type foods are often better tolerated so eat foods such as eggs, pancakes, waffles and cereal for any meal or snack. Carry snacks with you so you are prepared to eat every 2 to 3 hours. Determine what works best for you if your body's cues for feeling hungry or full are not working. Eat a small meal or snack even if you don't feel hungry. Set a timer to remind you when it is time to eat. Take a walk before you eat (with health care provider's approval). Light or moderate physical activity can help you maintain muscle and increase your appetite. Make Eating Enjoyable Taking steps to make the experience enjoyable may help to increase  your interest in eating and improve your appetite. Strategies: Eat with others whenever possible. Include your favorite foods to make meals more enjoyable. Try new foods. Save your beverage for the end of the meal so that you have more room for food before you get full. Add Calories to Your Meals and Snacks Try adding calorie-dense foods so that each bite provides more nutrition. Strategies Drink milk, chocolate milk, soy milk, or smoothies instead of low-calorie beverages such as diet drinks or water. Cook with milk or soy milk instead of water when making dishes such as hot cereal, cocoa, or pudding. Add jelly, jam, honey, butter or margarine to bread and crackers. Add jam or fruit to ice cream and as a topping over cake. Mix dried fruit, nuts, granola, honey, or dry cereal with yogurt or hot cereals. Enjoy snacks such as milkshakes, smoothies, pudding, ice cream, or custard. Blend a fruit smoothie of a banana, frozen berries, milk or soy milk, and 1 tablespoon nonfat powdered milk or protein powder. Add Protein to Your Meals and Snacks Choose at least one protein food at each meal and snack to increase your daily intake. Strategies Add  cup nonfat dry milk powder or protein powder to make a high-protein milk to drink or to use in recipes that call for milk. Vanilla or peppermint extract or unsweetened cocoa powder could help to boost the flavor. Add hard-cooked eggs, leftover meat, grated cheese, canned beans or tofu to noodles, rice, salads, sandwiches, soups, casseroles, pasta, tuna and other mixed dishes. Add powdered milk or protein powder to hot cereals,  meatloaf, casseroles, scrambled eggs, sauces, cream soups, and shakes. Add beans and lentils to salads, soups, casseroles, and vegetable dishes. Eat cottage cheese or yogurt, especially Greek yogurt, with fruit as a snack or dessert. Eat peanut or other nut butters on crackers, bread, toast, waffles, apples, bananas or celery sticks.  Add it to milkshakes, smoothies, or desserts. Consider a ready-made protein shake. Your RDN will make recommendations. Add Fats to Your Meals and Snacks Try adding fats to your meals and snacks. Fat provides more calories in fewer bites than carbohydrate or protein and adds flavors to your foods. Strategies Snack on nuts and seeds or add them to foods like salads, pasta, cereals, yogurt, and ice cream.  Saut or stir-fry vegetables, meats, chicken, fish or tofu in olive or canola oil.  Add olive oil, other vegetable oils, butter or margarine to soups, vegetables, potatoes, cooked cereal, rice, pasta, bread, crackers, pancakes, or waffles. Snack on olives or add to pasta, pizza, or salad. Add avocado or guacamole to your salads, sandwiches, and other entrees. Include fatty fish such as salmon in your weekly meal plan. For general food safety tips, especially for clients with immunocompromised conditions, ask your RDN for the Food Safety Nutrition Therapy handout. Small Meal and Snack Ideas These snacks and meals are recommended when you have to eat but aren't necessarily hungry.  They are good choices because they are high in protein and high in calories.  2 graham crackers 2 tablespoons peanut or other nut butter 1 cup milk 2 slices whole wheat toast topped with:  avocado, mashed Seasoning of your choice   cup Greek yogurt  cup fruit  cup granola 2 deviled egg halves 5 whole wheat crackers  1 cup cream of tomato soup  grilled cheese sandwich 1 toasted waffle topped with: 2 tablespoons peanut or nut butter 1 tablespoon jam  Trail mix made with:  cup nuts  cup dried fruit  cup cold cereal, any variety  cup oatmeal or cream of wheat cereal 1 tablespoon peanut or nut butter  cup diced fruit   High-Calorie, High-Protein Sample 1-Day Menu View Nutrient Info Breakfast 1 egg, scrambled 1 ounce cheddar cheese 1 English muffin, whole wheat 1 tablespoon margarine 1 tablespoon  jam  cup orange juice, fortified with calcium and vitamin D  Morning Snack 1 tablespoon peanut butter 1 banana 1 cup 1% milk  Lunch Tuna salad sandwich made with: 2 slices bread, whole wheat 3 ounces tuna mixed with: 1 tablespoon mayonnaise  cup pudding  Afternoon Snack  cup hummus  cup carrots 1 pita  Evening Meal Enchilada casserole made with: 2 corn tortillas 3 ounces ground beef, cooked  cup black beans, cooked  cup corn, cooked 1 ounce grated cheddar cheese  cup enchilada sauce  avocado, sliced, topping for enchilada 1 tablespoon sour cream, topping for enchilada Salad:  cup lettuce, shredded  cup tomatoes, chopped, for salad 1 tablespoon olive oil and vinegar dressing, for salad  Evening Snack  cup Greek yogurt  cup blueberries  cup granola

## 2022-12-07 NOTE — Assessment & Plan Note (Signed)
-  likely secondary to dehydration -continuous IV fluids and follow clinically

## 2022-12-07 NOTE — ED Provider Notes (Signed)
[place] EMERGENCY DEPARTMENT AT [facility] Provider Note   CSN: [id number] Arrival date & time: 12/07/22  1215     History  No chief complaint on file.   [name] is a 77 y.o. female.  She has PMH of hypertension, high cholesterol, A-fib on Eliquis, GERD and chronic cystitis.  Presents the ER today for abdominal pain with nausea and vomiting for 4 days.  Seen in the ED 2 days ago and diagnosed with UTI but not able to keep antibiotics down.  No fevers or chills, no chest pain or shortness of breath with complaining of diffuse abdominal pain.   HPI     Home Medications Prior to Admission medications   Medication Sig Start Date End Date Taking? Authorizing Provider  ondansetron (ZOFRAN) 4 MG tablet Take 1 tablet (4 mg total) by mouth every 6 (six) hours. 12/07/22  Yes Aleja Yearwood A, PA-C  acetaminophen (TYLENOL) 325 MG tablet Take 2 tablets (650 mg total) by mouth every 8 (eight) hours. 02/18/19   Love, Evlyn Kanner, PA-C  apixaban (ELIQUIS) 5 MG TABS tablet Take 1 tablet (5 mg total) by mouth 2 (two) times daily. 05/08/22   [place], Peter M, MD  ascorbic acid (VITAMIN C) 500 MG tablet Take 500 mg by mouth daily.    [provider]  atorvastatin (LIPITOR) 10 MG tablet Take 1 tablet (10 mg total) by mouth daily. 02/18/19   Love, Evlyn Kanner, PA-C  bisoprolol (ZEBETA) 5 MG tablet Take 0.5 tablets by mouth daily. 04/05/19   [provider]  buPROPion (WELLBUTRIN) 75 MG tablet Take 75 mg by mouth every morning. 12/31/20   [provider]  cefpodoxime (VANTIN) 200 MG tablet Take 1 tablet (200 mg total) by mouth 2 (two) times daily for 7 days. 12/06/22 12/13/22  Loetta Rough, MD  Cholecalciferol (VITAMIN D) 125 MCG (5000 UT) CAPS Take 5,000 Units by mouth daily.    [provider]  diclofenac sodium (VOLTAREN) 1 % GEL Apply 2 g topically 4 (four) times daily. Patient taking differently: Apply 2 g topically daily as needed (pain). 02/18/19    Love, Evlyn Kanner, PA-C  fentaNYL 37.5 MCG/HR PT72 Place 1 patch onto the skin every 3 (three) days. Apply 1 patch to the skin every 3 Three Days    [provider]  FLUoxetine (PROZAC) 20 MG capsule Take 1 capsule (20 mg total) by mouth at bedtime. 02/18/19   Love, Evlyn Kanner, PA-C  furosemide (LASIX) 20 MG tablet Take 1 tablet (20 mg total) by mouth as needed. Patient not taking: Reported on 06/17/2022 06/09/21   Cannon Kettle, PA-C  gabapentin (NEURONTIN) 600 MG tablet Take 600 mg by mouth 3 (three) times daily.    [provider]  levothyroxine (SYNTHROID) 88 MCG tablet Take 1 tablet (88 mcg total) by mouth daily before breakfast. 03/13/19   [place], Peter M, MD  methenamine (HIPREX) 1 g tablet Take 1 g by mouth 2 (two) times daily. 06/01/22   [provider]  ondansetron (ZOFRAN ODT) 4 MG disintegrating tablet Take 1 tablet (4 mg total) by mouth every 8 (eight) hours as needed for nausea or vomiting. 02/02/20   Armbruster, Willaim Rayas, MD  pantoprazole (PROTONIX) 40 MG tablet Take 1 tablet (40 mg total) by mouth 2 (two) times daily. 02/18/19   Love, Evlyn Kanner, PA-C  polyethylene glycol (MIRALAX) 17 g packet Take 8 g by mouth daily. Increase to twice a day if no BM by  day 2 Patient taking differently: Take 8 g by mouth daily as needed for mild constipation. 02/02/20   Pyrtle, Carie Caddy, MD      Allergies    Penicillins    Review of Systems   Review of Systems  Physical Exam Updated Vital Signs BP (!) 171/108 (BP Location: Right Arm)   Pulse 94   Temp 98.8 F (37.1 C)   Resp 18   SpO2 97%  Physical Exam Vitals and nursing note reviewed.  Constitutional:      General: She is not in acute distress.    Appearance: She is well-developed. She is ill-appearing.  HENT:     Head: Normocephalic and atraumatic.     Nose: Nose normal.     Mouth/Throat:     Mouth: Mucous membranes are moist.  Eyes:     Extraocular Movements: Extraocular movements intact.      Conjunctiva/sclera: Conjunctivae normal.     Pupils: Pupils are equal, round, and reactive to light.  Cardiovascular:     Rate and Rhythm: Normal rate and regular rhythm.     Heart sounds: No murmur heard. Pulmonary:     Effort: Pulmonary effort is normal. No respiratory distress.     Breath sounds: Normal breath sounds.  Abdominal:     General: Abdomen is flat.     Palpations: Abdomen is soft.     Tenderness: There is generalized abdominal tenderness.  Musculoskeletal:        General: No swelling. Normal range of motion.     Cervical back: Neck supple.  Skin:    General: Skin is warm and dry.     Capillary Refill: Capillary refill takes less than 2 seconds.  Neurological:     General: No focal deficit present.     Mental Status: She is alert and oriented to person, place, and time.  Psychiatric:        Mood and Affect: Mood normal.     ED Results / Procedures / Treatments   Labs (all labs ordered are listed, but only abnormal results are displayed) Labs Reviewed  CBC WITH DIFFERENTIAL/PLATELET - Abnormal; Notable for the following components:      Result Value   WBC 11.3 (*)    Hemoglobin 15.5 (*)    Neutro Abs 9.4 (*)    All other components within normal limits  COMPREHENSIVE METABOLIC PANEL - Abnormal; Notable for the following components:   Sodium 132 (*)    Potassium 3.3 (*)    Chloride 97 (*)    Glucose, Bld 118 (*)    Total Bilirubin 2.0 (*)    All other components within normal limits  URINALYSIS, ROUTINE W REFLEX MICROSCOPIC - Abnormal; Notable for the following components:   Color, Urine GREEN (*)    APPearance CLOUDY (*)    Hgb urine dipstick LARGE (*)    Ketones, ur 20 (*)    Protein, ur 100 (*)    Leukocytes,Ua LARGE (*)    All other components within normal limits  SARS CORONAVIRUS 2 BY RT PCR  LIPASE, BLOOD    EKG None  Radiology CT ABDOMEN PELVIS W CONTRAST  Result Date: 12/07/2022 CLINICAL DATA:  Acute abdominal pain, urinary tract  infection, intermittent vomiting EXAM: CT ABDOMEN AND PELVIS WITH CONTRAST TECHNIQUE: Multidetector CT imaging of the abdomen and pelvis was performed using the standard protocol following bolus administration of intravenous contrast. RADIATION DOSE REDUCTION: This exam was performed according to the departmental dose-optimization program which includes automated exposure  control, adjustment of the mA and/or kV according to patient size and/or use of iterative reconstruction technique. CONTRAST:  80mL OMNIPAQUE IOHEXOL 300 MG/ML  SOLN COMPARISON:  06/17/2022, 04/29/2021, 04/29/2016 FINDINGS: Lower chest: No acute pleural or parenchymal lung disease. Hepatobiliary: Stable 1 cm hemangioma is again seen within the left lobe liver reference image 16/2. Indeterminate area of indistinct increased attenuation during the early phase of the exam reference image 20/2 measuring 1.7 cm likely reflects transient hepatic attenuation difference or flash fill hemangioma. Numerous hepatic cysts are again noted. No intrahepatic biliary duct dilation. Prior cholecystectomy, with expected postsurgical dilatation of the common bile duct. No change in the partially calcified low-attenuation lesion adjacent to the left lobe liver, felt to represent benign hemangioma based on previous imaging findings. Pancreas: Chronic pancreatic duct dilation unchanged. Pancreatic divisum again noted. The pancreas is unremarkable without acute finding. Spleen: Normal in size without focal abnormality. Adrenals/Urinary Tract: Solitary left kidney demonstrates normal parenchymal enhancement. No urinary tract calculus or obstructive uropathy. Normal excretion of contrast on delayed imaging. The bilateral adrenals are unremarkable. The bladder is decompressed, limiting its evaluation. Evaluation of the lower pelvis is limited due to streak artifact from right hip arthroplasty. Stomach/Bowel: No bowel obstruction or ileus. No bowel wall thickening or  inflammatory change. Vascular/Lymphatic: Aortic atherosclerosis. 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 No enlarged abdominal or pelvic lymph nodes. Reproductive: Status post hysterectomy. No adnexal masses. Evaluation of the lower pelvis is limited by streak artifact from right hip arthroplasty. Other: No free fluid or free intraperitoneal gas. No abdominal wall hernia. Musculoskeletal: Right hip arthroplasty. No acute or destructive bony lesions. Postsurgical changes from prior L3-4 laminectomy and posterior fusion. Reconstructed images demonstrate no additional findings. IMPRESSION: 1. No acute intra-abdominal or intrapelvic process. 2. Unremarkable solitary left kidney. Evaluation of the bladder is limited due to incomplete distension and streak artifact from right hip arthroplasty. If urinary tract infection remains a concern, correlation with urinalysis is recommended. 3. 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 4.  Aortic Atherosclerosis (ICD10-I70.0). Electronically Signed   By: Sharlet Salina M.D.   On: 12/07/2022 15:41   DG Chest Portable 1 View  Result Date: 12/07/2022 CLINICAL DATA:  Malaise EXAM: PORTABLE CHEST 1 VIEW COMPARISON:  05/27/2020 FINDINGS: Stable enlargement of the cardiopericardial silhouette. Atherosclerotic calcification of the aortic arch. The lungs appear clear.  No blunting of the costophrenic angles. Reverse lordotic projection possibly due to underlying kyphosis. IMPRESSION: 1. Stable enlargement of the cardiopericardial silhouette, without edema. 2. Reverse lordotic projection possibly due to underlying kyphosis. Electronically Signed   By: Gaylyn Rong M.D.   On: 12/07/2022 15:07    Procedures Procedures    Medications Ordered in ED Medications  promethazine (PHENERGAN) 12.5 mg in sodium chloride 0.9 % 50 mL IVPB (0 mg Intravenous Stopped 12/07/22 1928)  fentaNYL (SUBLIMAZE) injection 25 mcg (25 mcg Intravenous Given 12/07/22 1344)  ondansetron (ZOFRAN)  injection 4 mg (4 mg Intravenous Given 12/07/22 1344)  sodium chloride 0.9 % bolus 500 mL (0 mLs Intravenous Stopped 12/07/22 1929)  iohexol (OMNIPAQUE) 300 MG/ML solution 80 mL (80 mLs Intravenous Contrast Given 12/07/22 1504)  potassium chloride (KLOR-CON M) CR tablet 30 mEq (30 mEq Oral Given 12/07/22 1638)  sodium chloride 0.9 % bolus 500 mL (0 mLs Intravenous Stopped 12/07/22 1929)    ED Course/ Medical Decision Making/ A&P Clinical Course as of 12/07/22 2054  Thu Dec 07, 2022  1341 Patient presents to ER complaining of nausea and vomiting.  I  spoke with her daughter on the phone, she was diagnosed with UTI but last night became worse. [CB]  1506 Feels much better after 25 of fentanyl, spoke with her son at bedside, pending CT results [CB]    Clinical Course User Index [CB] Ma Rings, PA-C                             Medical Decision Making This patient presents to the ED for concern of abdominal pain, nausea and vomiting, this involves an extensive number of treatment options, and is a complaint that carries with it a high risk of complications and morbidity.  The differential diagnosis includes  UTI, sepsis, COVID-19, gastritis, gastroenteritis, other    Co morbidities that complicate the patient evaluation  GERD, afib   Additional history obtained:  Additional history obtained from EMR External records from outside source obtained and reviewed including an ED visit for UTI   Lab Tests:  I Ordered, and personally interpreted labs.  The pertinent results include: CBC, CMP and lipase were all ordered, CBC shows a white blood cell count of 11.3, hemoglobin 15.5; CT MP shows mild hyponatremia, mild hypokalemia and hypochloremia, lipase negative, urinalysis shows cloudy urine with large blood, positive ketones, large leukocytes greater than 50 red blood cells and greater than 50 white blood cells per high-power field   Imaging Studies ordered:  I ordered imaging studies  including CT abdomen and pelvis, chest x-ray I independently visualized and interpreted imaging which showed x-ray shows no pulmonary edema or infiltrate, CT abdomen pelvis shows no acute intra-abdominal process I agree with the radiologist interpretation   Cardiac Monitoring: / EKG:  The patient was maintained on a cardiac monitor.  I personally viewed and interpreted the cardiac monitored which showed an underlying rhythm of: sinus rhythm   Consultations Obtained:  I requested consultation with the hospitalist,  and discussed lab and imaging findings as well as pertinent plan - they recommend: admission   Problem List / ED Course / Critical interventions / Medication management  Intractable nausea and vomiting-patient comes in for abdominal pain and nausea and vomiting, initially was moaning and yelling but after 25 mcg of fentanyl was able to answer questions was completely oriented, was feeling better, ordered CT abdomen pelvis which are reassuring.  Urine still looks infected with no signs of contamination.  Given Rocephin.  Plan is for discharge as patient is feeling better, potassium was slightly low so gave her p.o. potassium started vomiting again, back to feeling unwell, discussed with patient's daughter and her husband, states she has been unwell for past 4 days not able to take any of her medications and do not feel she can go home, given her continued persistent nausea vomiting at home despite medications and despite medications in the ED discussed with hospitalist Dr. To who is agreeable with admission I ordered medication including Zofran for nausea, fentanyl for pain Reevaluation of the patient after these medicines showed that the patient improved and then back to same as before arrival I have reviewed the patients home medicines and have made adjustments as needed   Social Determinants of Health:  Lives at home        Amount and/or Complexity of Data Reviewed Labs:  ordered. Radiology: ordered.  Risk Prescription drug management. Decision regarding hospitalization.           Final Clinical Impression(s) / ED Diagnoses Final diagnoses:  Generalized abdominal pain  Nausea and vomiting, unspecified vomiting type    Rx / DC Orders ED Discharge Orders          Ordered    ondansetron (ZOFRAN) 4 MG tablet  Every 6 hours        12/07/22 1623              Josem Kaufmann 12/07/22 2130    Lorre Nick, MD 12/08/22 1148

## 2022-12-07 NOTE — Assessment & Plan Note (Signed)
-  UA appears grossly positive both today and two days ago. However urine culture on 4/16 was negative. She is followed by [facility] with these recurrent symptoms and usually has negative urine culture -previously on methenamine and started on Uramet 2 days ago by urology -will treat with empiric dose of IV Cipro tonight given her increase temperature of 100.2, leukocytosis with increase neutrophils-although could just be due to dehydration. Continue to follow fever curve and urine culture.  -needs urology consult in the morning

## 2022-12-07 NOTE — Assessment & Plan Note (Signed)
-  continue levothyroxine -will check TSH as this was low about 5 months ago. Could be contributory to some of her GI symptoms.
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
## 2022-12-08 ENCOUNTER — Encounter (HOSPITAL_COMMUNITY): Payer: Self-pay | Admitting: Family Medicine

## 2022-12-08 ENCOUNTER — Other Ambulatory Visit: Payer: Self-pay

## 2022-12-08 DIAGNOSIS — I69398 Other sequelae of cerebral infarction: Secondary | ICD-10-CM | POA: Diagnosis not present

## 2022-12-08 DIAGNOSIS — R1084 Generalized abdominal pain: Secondary | ICD-10-CM | POA: Diagnosis present

## 2022-12-08 DIAGNOSIS — J449 Chronic obstructive pulmonary disease, unspecified: Secondary | ICD-10-CM | POA: Diagnosis present

## 2022-12-08 DIAGNOSIS — E876 Hypokalemia: Secondary | ICD-10-CM | POA: Diagnosis present

## 2022-12-08 DIAGNOSIS — G928 Other toxic encephalopathy: Secondary | ICD-10-CM | POA: Diagnosis present

## 2022-12-08 DIAGNOSIS — E873 Alkalosis: Secondary | ICD-10-CM | POA: Diagnosis present

## 2022-12-08 DIAGNOSIS — D751 Secondary polycythemia: Secondary | ICD-10-CM | POA: Diagnosis present

## 2022-12-08 DIAGNOSIS — I700 Atherosclerosis of aorta: Secondary | ICD-10-CM | POA: Diagnosis present

## 2022-12-08 DIAGNOSIS — E871 Hypo-osmolality and hyponatremia: Secondary | ICD-10-CM | POA: Diagnosis present

## 2022-12-08 DIAGNOSIS — R8281 Pyuria: Secondary | ICD-10-CM | POA: Diagnosis not present

## 2022-12-08 DIAGNOSIS — R112 Nausea with vomiting, unspecified: Secondary | ICD-10-CM | POA: Diagnosis not present

## 2022-12-08 DIAGNOSIS — E7800 Pure hypercholesterolemia, unspecified: Secondary | ICD-10-CM | POA: Diagnosis present

## 2022-12-08 DIAGNOSIS — F419 Anxiety disorder, unspecified: Secondary | ICD-10-CM | POA: Diagnosis present

## 2022-12-08 DIAGNOSIS — I10 Essential (primary) hypertension: Secondary | ICD-10-CM | POA: Diagnosis present

## 2022-12-08 DIAGNOSIS — R569 Unspecified convulsions: Secondary | ICD-10-CM | POA: Diagnosis not present

## 2022-12-08 DIAGNOSIS — Z681 Body mass index (BMI) 19 or less, adult: Secondary | ICD-10-CM | POA: Diagnosis not present

## 2022-12-08 DIAGNOSIS — G9341 Metabolic encephalopathy: Secondary | ICD-10-CM | POA: Diagnosis not present

## 2022-12-08 DIAGNOSIS — E039 Hypothyroidism, unspecified: Secondary | ICD-10-CM | POA: Diagnosis present

## 2022-12-08 DIAGNOSIS — F329 Major depressive disorder, single episode, unspecified: Secondary | ICD-10-CM | POA: Diagnosis present

## 2022-12-08 DIAGNOSIS — R29810 Facial weakness: Secondary | ICD-10-CM | POA: Diagnosis not present

## 2022-12-08 DIAGNOSIS — R5381 Other malaise: Secondary | ICD-10-CM | POA: Diagnosis not present

## 2022-12-08 DIAGNOSIS — Z1152 Encounter for screening for COVID-19: Secondary | ICD-10-CM | POA: Diagnosis not present

## 2022-12-08 DIAGNOSIS — N3000 Acute cystitis without hematuria: Secondary | ICD-10-CM

## 2022-12-08 DIAGNOSIS — E861 Hypovolemia: Secondary | ICD-10-CM | POA: Diagnosis present

## 2022-12-08 DIAGNOSIS — I722 Aneurysm of renal artery: Secondary | ICD-10-CM | POA: Diagnosis present

## 2022-12-08 DIAGNOSIS — I4819 Other persistent atrial fibrillation: Secondary | ICD-10-CM | POA: Diagnosis present

## 2022-12-08 DIAGNOSIS — E440 Moderate protein-calorie malnutrition: Secondary | ICD-10-CM | POA: Diagnosis present

## 2022-12-08 DIAGNOSIS — R197 Diarrhea, unspecified: Secondary | ICD-10-CM | POA: Diagnosis not present

## 2022-12-08 DIAGNOSIS — A498 Other bacterial infections of unspecified site: Secondary | ICD-10-CM | POA: Diagnosis not present

## 2022-12-08 DIAGNOSIS — N390 Urinary tract infection, site not specified: Secondary | ICD-10-CM | POA: Diagnosis not present

## 2022-12-08 DIAGNOSIS — E860 Dehydration: Secondary | ICD-10-CM | POA: Diagnosis present

## 2022-12-08 DIAGNOSIS — K8689 Other specified diseases of pancreas: Secondary | ICD-10-CM | POA: Diagnosis present

## 2022-12-08 DIAGNOSIS — Q453 Other congenital malformations of pancreas and pancreatic duct: Secondary | ICD-10-CM | POA: Diagnosis not present

## 2022-12-08 DIAGNOSIS — R4182 Altered mental status, unspecified: Secondary | ICD-10-CM | POA: Diagnosis not present

## 2022-12-08 DIAGNOSIS — G319 Degenerative disease of nervous system, unspecified: Secondary | ICD-10-CM | POA: Diagnosis not present

## 2022-12-08 DIAGNOSIS — A0472 Enterocolitis due to Clostridium difficile, not specified as recurrent: Secondary | ICD-10-CM | POA: Diagnosis present

## 2022-12-08 DIAGNOSIS — R627 Adult failure to thrive: Secondary | ICD-10-CM | POA: Diagnosis present

## 2022-12-08 LAB — CBC
HCT: 46.3 % — ABNORMAL HIGH (ref 36.0–46.0)
Hemoglobin: 15.7 g/dL — ABNORMAL HIGH (ref 12.0–15.0)
MCH: 30.9 pg (ref 26.0–34.0)
MCHC: 33.9 g/dL (ref 30.0–36.0)
MCV: 91.1 fL (ref 80.0–100.0)
Platelets: 319 10*3/uL (ref 150–400)
RBC: 5.08 MIL/uL (ref 3.87–5.11)
RDW: 12.3 % (ref 11.5–15.5)
WBC: 13.1 10*3/uL — ABNORMAL HIGH (ref 4.0–10.5)
nRBC: 0 % (ref 0.0–0.2)

## 2022-12-08 LAB — BASIC METABOLIC PANEL
Anion gap: 10 (ref 5–15)
BUN: 13 mg/dL (ref 8–23)
CO2: 24 mmol/L (ref 22–32)
Calcium: 10.1 mg/dL (ref 8.9–10.3)
Chloride: 100 mmol/L (ref 98–111)
Creatinine, Ser: 0.88 mg/dL (ref 0.44–1.00)
GFR, Estimated: >60 mL/min (ref >60)
Glucose, Bld: 105 mg/dL — ABNORMAL HIGH (ref 70–99)
Potassium: 2.8 mmol/L — ABNORMAL LOW (ref 3.5–5.1)
Sodium: 134 mmol/L — ABNORMAL LOW (ref 135–145)

## 2022-12-08 LAB — MAGNESIUM: Magnesium: 1.7 mg/dL (ref 1.7–2.4)

## 2022-12-08 LAB — C DIFFICILE QUICK SCREEN W PCR REFLEX
C Diff antigen: POSITIVE — AB
C Diff toxin: NEGATIVE

## 2022-12-08 LAB — T4, FREE: Free T4: 1.26 ng/dL — ABNORMAL HIGH (ref 0.61–1.12)

## 2022-12-08 LAB — SEDIMENTATION RATE: Sed Rate: 1 mm/hr (ref 0–22)

## 2022-12-08 LAB — CLOSTRIDIUM DIFFICILE BY PCR, REFLEXED: Toxigenic C. Difficile by PCR: POSITIVE — AB

## 2022-12-08 LAB — TSH: TSH: 0.159 u[IU]/mL — ABNORMAL LOW (ref 0.350–4.500)

## 2022-12-08 LAB — C-REACTIVE PROTEIN: CRP: 0.5 mg/dL (ref <1.0)

## 2022-12-08 MED ORDER — MIRTAZAPINE 15 MG PO TABS
15.0000 mg | ORAL_TABLET | Freq: Every day | ORAL | Status: DC
  Administered 2022-12-09 – 2022-12-11 (×3): 15 mg via ORAL
  Filled 2022-12-08 (×4): qty 1

## 2022-12-08 MED ORDER — SODIUM CHLORIDE 0.9 % IV SOLN
1.0000 g | Freq: Every day | INTRAVENOUS | Status: DC
  Administered 2022-12-08 – 2022-12-10 (×3): 1 g via INTRAVENOUS
  Filled 2022-12-08 (×3): qty 10

## 2022-12-08 MED ORDER — POTASSIUM CHLORIDE 10 MEQ/100ML IV SOLN
10.0000 meq | INTRAVENOUS | Status: AC
  Administered 2022-12-08 (×6): 10 meq via INTRAVENOUS
  Filled 2022-12-08 (×5): qty 100

## 2022-12-08 MED ORDER — HYDRALAZINE HCL 20 MG/ML IJ SOLN
5.0000 mg | Freq: Four times a day (QID) | INTRAMUSCULAR | Status: AC | PRN
  Administered 2022-12-08 (×2): 5 mg via INTRAVENOUS
  Filled 2022-12-08 (×2): qty 1

## 2022-12-08 MED ORDER — ADULT MULTIVITAMIN W/MINERALS CH
1.0000 | ORAL_TABLET | Freq: Every day | ORAL | Status: DC
  Administered 2022-12-08 – 2022-12-15 (×7): 1 via ORAL
  Filled 2022-12-08 (×8): qty 1

## 2022-12-08 MED ORDER — MAGNESIUM SULFATE 2 GM/50ML IV SOLN
2.0000 g | Freq: Once | INTRAVENOUS | Status: AC
  Administered 2022-12-08: 2 g via INTRAVENOUS
  Filled 2022-12-08: qty 50

## 2022-12-08 MED ORDER — POTASSIUM CHLORIDE CRYS ER 20 MEQ PO TBCR: 40.0000 meq | EXTENDED_RELEASE_TABLET | Freq: Three times a day (TID) | ORAL | Status: DC

## 2022-12-08 MED ORDER — FENTANYL 25 MCG/HR TD PT72
1.0000 | MEDICATED_PATCH | TRANSDERMAL | Status: DC
  Administered 2022-12-08: 1 via TRANSDERMAL
  Filled 2022-12-08: qty 1

## 2022-12-08 MED ORDER — POTASSIUM CHLORIDE IN NACL 40-0.9 MEQ/L-% IV SOLN
INTRAVENOUS | Status: AC
  Filled 2022-12-08 (×3): qty 1000

## 2022-12-08 MED ORDER — LIP MEDEX EX OINT
TOPICAL_OINTMENT | CUTANEOUS | Status: DC | PRN
  Filled 2022-12-08: qty 7

## 2022-12-08 MED ORDER — POTASSIUM CHLORIDE CRYS ER 20 MEQ PO TBCR
40.0000 meq | EXTENDED_RELEASE_TABLET | ORAL | Status: DC
  Filled 2022-12-08: qty 2

## 2022-12-08 MED ORDER — BOOST / RESOURCE BREEZE PO LIQD CUSTOM
1.0000 | Freq: Three times a day (TID) | ORAL | Status: DC
  Administered 2022-12-08 – 2022-12-12 (×4): 1 via ORAL

## 2022-12-08 MED ORDER — POTASSIUM CHLORIDE IN NACL 20-0.9 MEQ/L-% IV SOLN
INTRAVENOUS | Status: DC
  Filled 2022-12-08: qty 1000

## 2022-12-08 NOTE — Progress Notes (Signed)
    Patient Name: [name]           DOB: 03-13-1946  MRN: [id number]      Admission Date: 12/07/2022  Attending Provider: Almon Hercules, MD  Primary Diagnosis: Intractable nausea and vomiting   Level of care: Telemetry    CROSS COVER NOTE   Date of Service   12/08/2022   [name], 77 y.o. female, was admitted on 12/07/2022 for Intractable nausea and vomiting.    HPI/Events of Note   Patient is Cdiff positive, and has had multiple loose stools in the past few hours.  RN reports concern for skin integrity.    Order placed for Flexi-Seal.   Interventions/ Plan   Above        Anthoney Harada, DNP, [facility]- [facility] [place]

## 2022-12-08 NOTE — Progress Notes (Signed)
Initial Nutrition Assessment  DOCUMENTATION CODES:   Non-severe (moderate) malnutrition in context of social or environmental circumstances, Underweight  INTERVENTION:   -Boost Breeze po TID, each supplement provides 250 kcal and 9 grams of protein   -Magic cup TID with meals, each supplement provides 290 kcal and 9 grams of protein   -Multivitamin with minerals daily  -Given malnutrition and poor PO, will checking vitamin labs: Vitamin A, Zinc, folate, Vitamin B-12  -Placed "High Calorie, High Protein" handout in AVS  NUTRITION DIAGNOSIS:   Moderate Malnutrition related to social / environmental circumstances (depression) as evidenced by moderate fat depletion, moderate muscle depletion, percent weight loss.  GOAL:   Patient will meet greater than or equal to 90% of their needs  MONITOR:   PO intake, Supplement acceptance, Labs, Weight trends, I & O's  REASON FOR ASSESSMENT:   Consult Assessment of nutrition requirement/status  ASSESSMENT:   77 year old F with PMH of persistent A-fib on Eliquis, hypothyroidism, HTN, HLD, GERD, depression and persistent UTI symptoms presenting with nausea, vomiting, abdominal pain, poor p.o. intake, weakness and UTI symptoms for about 5 days.  Patient in room, husband at bedside. Pt lying in bed, not very interactive but will answer questions when spoken directly to. Per pt's husband, pt had not eaten anything since 4/15. Prior to that pt mainly consuming vegetables such as corn, beans and other foods like potato salad and chicken. Not eating much, at most 2 meals and has not had much of an appetite. Pt had N/V PTA, she was being treated for an UTI and couldn't keep her medications down.  Pt does take Vitamin D and Vitamin C at home. No protein supplements. Pt doesn't like Ensure or Boost but was agreeable to trying clear liquid Boost Breeze and Magic cup ice creams.  Pt reports hair loss and changes in her vision. Given this information plus  poor PO and losses from diarrhea and vomiting, will check vitamin labs for deficiencies.  Per weight records, pt has lost 21 lbs since 05/08/22 (16% wt loss x 7 months, significant for time frame).   Medications: Remeron, IV Mg sulfate, KCl  Labs reviewed: Low Na Low K   NUTRITION - FOCUSED PHYSICAL EXAM:  Flowsheet Row Most Recent Value  Orbital Region Moderate depletion  Upper Arm Region Moderate depletion  Thoracic and Lumbar Region Moderate depletion  Buccal Region Moderate depletion  Temple Region Moderate depletion  Clavicle Bone Region Mild depletion  Clavicle and Acromion Bone Region Mild depletion  Scapular Bone Region Mild depletion  Dorsal Hand Mild depletion  Patellar Region Moderate depletion  Anterior Thigh Region Moderate depletion  Posterior Calf Region Severe depletion  Edema (RD Assessment) None  Hair Reviewed  Thea Alken loss]  Eyes Reviewed  [reports vision changes]  Mouth Reviewed  Skin Reviewed  Nails Reviewed       Diet Order:   Diet Order             Diet full liquid Room service appropriate? Yes; Fluid consistency: Thin  Diet effective now                   EDUCATION NEEDS:   Education needs have been addressed  Skin:  Skin Assessment: Reviewed RN Assessment  Last BM:  4/19 -type 7  Height:   Ht Readings from Last 1 Encounters:  12/08/22  (1.702 m)    Weight:   Wt Readings from Last 1 Encounters:  12/08/22 52.4 kg    BMI:  Body mass index is 18.09 kg/m.  Estimated Nutritional Needs:   Kcal:  1600-1800  Protein:  75-90g  Fluid:  1.8L/day  Tilda Franco, MS, RD, LDN Inpatient Clinical Dietitian Contact information available via Am[employer]

## 2022-12-08 NOTE — Progress Notes (Signed)
PROGRESS NOTE  [name]:[id number] DOB: 10-28-1945   PCP: Chilton Greathouse, MD  Patient is from: Home.  Lives with husband.  Uses rolling walker  DOA: 12/07/2022 LOS: 0  Chief complaints No chief complaint on file.    Brief Narrative / Interim history: 77 year old F with PMH of persistent A-fib on Eliquis, hypothyroidism, HTN, HLD, GERD, depression and persistent UTI symptoms presenting with nausea, vomiting, abdominal pain, poor p.o. intake, weakness and UTI symptoms for about 5 days.  She is followed by urology for recurrent UTI symptoms.  A urine culture has been persistently negative.  She was on methenamine prophylactically that stopped 2 days ago and started on a new medication to help with inflammation and symptoms.  She was also seen in ED 2 days ago was started on Vantin.   In ED, has mild temp to 100.2.  Slightly tachycardic to 107.  Slightly hypertensive.  UA with pyuria.    Subjective: Seen and examined earlier this morning.  No major events overnight of this morning.  She had some loose bowel movements overnight.  Reports not feeling well.  Somewhat frustrated.  She currently denies nausea and vomiting but no appetite.  Per daughter, she has dysuria, frequency and urgency and poor appetite.  She denies abdominal pain or new back pain.  With family out of the room, patient denies social stressors.  However, she reports a depressed mood due to her health situation and quality of life, particularly not being able to be there for her husband.  Patient is okay sharing this with family.   Objective: Vitals:   12/08/22 0041 12/08/22 0427 12/08/22 0600 12/08/22 0757  BP:  (!) 173/117 (!) 144/104 (!) 158/98  Pulse:  99  (!) 101  Resp:  18  20  Temp:  98.7 F (37.1 C)  98.9 F (37.2 C)  TempSrc:  Oral  Oral  SpO2:  97%  98%  Weight:  52.4 kg    Height: 5\' 7"  (1.702 m)       Examination:  GENERAL: Appears frail.  No apparent distress. HEENT: MMM.  Vision and  hearing grossly intact.  NECK: Supple.  No apparent JVD.  RESP:  No IWOB.  Fair aeration bilaterally. CVS:  RRR. Heart sounds normal.  ABD/GI/GU: BS+. Abd soft.  Mild tenderness across lower abdomen. MSK/EXT:  Moves extremities. No apparent deformity. No edema.  SKIN: no apparent skin lesion or wound NEURO: Awake, alert and oriented appropriately.  No apparent focal neuro deficit. PSYCH: Admits to depressed mood.  Somewhat flat affect.  Denies SI or HI.  Procedures:  None  Microbiology summarized: COVID-19 PCR nonreactive.  Assessment and plan: Principal Problem:   Pyuria Active Problems:   Intractable nausea and vomiting   HTN (hypertension)   Persistent atrial fibrillation   Hypokalemia   Hypothyroidism   Acute metabolic encephalopathy   Hyponatremia      Intractable nausea and vomiting/decreased oral intake: Unclear etiology.  She appears frail.  She has history of gastritis for which she was on PPI.  She was also on Levsin last month.  Some concern about UTI.  CT abdomen and pelvis unrevealing.  CMP unrevealing.  She looks depressed.  -Continue full liquid diet -IV fluid and antiemetics as needed -Added Remeron and multivitamin -Check CRP and ESR -IV ceftriaxone for possible UTI pending urine culture    Possible UTI: Patient with chronic UTI symptoms.  Urine cultures persistently negative.  Has been tried on different antibiotics previously.  She  was also on methenamine.  She is followed by urology outpatient.  UA with pyuria.  She has tenderness across lower abdomen.  Has mild temp to 100.2 with some leukocytosis on presentation.  She was started on Cipro.  Other possibilities including overactive bladder or interstitial cystitis. 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  She seems to be on Prozac at home.  She also  have poor p.o. intake.  She has hypothyroidism.  TSH is very low. -Start Remeron 15 mg at night.  Discussed this with patient and family -Check free T4 -Consult dietitian  Diarrhea: Reportedly had 5 episodes of diarrhea overnight.  No signs of colitis on CT.  She has mild leukocytosis.  Has been on antibiotics for quite some time.  Mild abdominal tenderness -Check C. difficile  Hypothyroidism: TSH very low. -Check free T4 -Continue home Synthroid for now  Hyponatremia: Likely due to dehydration.  Improving. -Continue IV fluid   Acute metabolic encephalopathy: Seems to have resolved. -Treat treatable causes -Reorientation and delirium precaution   Hypokalemia: K2.8.  Mg 1.7. -IV KCl 10 x 6 -IV NS with KCl -IV magnesium 2 g x 1   Persistent atrial fibrillation -continue Eliquis and bisoprolol   HTN (hypertension) -Continue metoprolol   Body mass index is 18.09 kg/m.           DVT prophylaxis:   apixaban (ELIQUIS) tablet 5 mg  Code Status: Full code Family Communication: Updated patient's husband and daughter at bedside Level of care: Telemetry Status is: Observation The patient will require care spanning > 2 midnights and should be moved to inpatient because: Intractable nausea and vomiting, UTI and electrolyte derangement   Final disposition: TBD Consultants:  None  55 minutes with more than 50% spent in reviewing records, counseling patient/family and coordinating care.   Sch Meds:  Scheduled Meds:  apixaban  5 mg Oral BID   bisoprolol  2.5 mg Oral Daily   levothyroxine  88 mcg Oral Q0600   mirtazapine  15 mg Oral QHS   Continuous Infusions:  0.9 % NaCl with KCl 40 mEq / L 100 mL/hr at 12/08/22 0910   potassium chloride 10 mEq (12/08/22 1129)   promethazine (PHENERGAN) injection (IM or IVPB) Stopped (12/07/22 1928)   PRN Meds:.hydrALAZINE, lip balm, promethazine (PHENERGAN) injection (IM or IVPB)  Antimicrobials: Anti-infectives (From admission,  onward)    Start     Dose/Rate Route Frequency Ordered Stop   12/07/22 2245  ciprofloxacin (CIPRO) IVPB 400 mg        400 mg 200 mL/hr over 60 Minutes Intravenous Every 12 hours 12/07/22 2145 12/08/22 1122        I have personally reviewed the following labs and images: CBC: Recent Labs  Lab 12/05/22 1226 12/07/22 1301 12/08/22 0528  WBC 6.5 11.3* 13.1*  NEUTROABS  --  9.4*  --   HGB 13.1 15.5* 15.7*  HCT 39.5 45.0 46.3*  MCV 94.0 89.5 91.1  PLT 241 362 319   BMP &GFR Recent Labs  Lab 12/05/22 1226 12/07/22 1301 12/08/22 0528  NA 136 132* 134*  K 3.3* 3.3* 2.8*  CL 102 97* 100  CO2 27 24 24   GLUCOSE 126* 118* 105*  BUN 16 17 13   CREATININE 0.97 0.90 0.88  CALCIUM 9.7 10.3 10.1  MG  --   --  1.7   Estimated  Creatinine Clearance: 44.3 mL/min (by C-G formula based on SCr of 0.88 mg/dL). Liver & Pancreas: Recent Labs  Lab 12/07/22 1301  AST 35  ALT 13  ALKPHOS 64  BILITOT 2.0*  PROT 7.9  ALBUMIN 4.6   Recent Labs  Lab 12/05/22 1226 12/07/22 1301  LIPASE 24 35   No results for input(s): "AMMONIA" in the last 168 hours. Diabetic: No results for input(s): "HGBA1C" in the last 72 hours. No results for input(s): "GLUCAP" in the last 168 hours. Cardiac Enzymes: No results for input(s): "CKTOTAL", "CKMB", "CKMBINDEX", "TROPONINI" in the last 168 hours. No results for input(s): "PROBNP" in the last 8760 hours. Coagulation Profile: No results for input(s): "INR", "PROTIME" in the last 168 hours. Thyroid Function Tests: Recent Labs    12/08/22 0528  TSH 0.159*   Lipid Profile: No results for input(s): "CHOL", "HDL", "LDLCALC", "TRIG", "CHOLHDL", "LDLDIRECT" in the last 72 hours. Anemia Panel: No results for input(s): "VITAMINB12", "FOLATE", "FERRITIN", "TIBC", "IRON", "RETICCTPCT" in the last 72 hours. Urine analysis:    Component Value Date/Time   COLORURINE GREEN (A) 12/07/2022 1445   APPEARANCEUR CLOUDY (A) 12/07/2022 1445   LABSPEC 1.019  12/07/2022 1445   PHURINE 5.0 12/07/2022 1445   GLUCOSEU NEGATIVE 12/07/2022 1445   HGBUR LARGE (A) 12/07/2022 1445   BILIRUBINUR NEGATIVE 12/07/2022 1445   KETONESUR 20 (A) 12/07/2022 1445   PROTEINUR 100 (A) 12/07/2022 1445   NITRITE NEGATIVE 12/07/2022 1445   LEUKOCYTESUR LARGE (A) 12/07/2022 1445   Sepsis Labs: Invalid input(s): "PROCALCITONIN", "LACTICIDVEN"  Microbiology: Recent Results (from the past 240 hour(s))  Urine Culture     Status: None   Collection Time: 12/05/22 12:26 PM   Specimen: Urine, Clean Catch  Result Value Ref Range Status   Specimen Description   Final    URINE, CLEAN CATCH Performed at [facility], [address], [place], [place] [address]    Special Requests   Final    NONE Performed at [facility], [address], [place], [place] [address]    Culture   Final    NO GROWTH Performed at [facility], [address], [place], [place] [address]    Report Status 12/06/2022 FINAL  Final  SARS Coronavirus 2 by RT PCR (hospital order, performed in [name] hospital lab) *cepheid single result test* Anterior Nasal Swab     Status: None   Collection Time: 12/07/22  6:35 PM   Specimen: Anterior Nasal Swab  Result Value Ref Range Status   SARS Coronavirus 2 by RT PCR NEGATIVE NEGATIVE Final    Comment: (NOTE) SARS-CoV-2 target nucleic acids are NOT DETECTED.  The SARS-CoV-2 RNA is generally detectable in upper and lower respiratory specimens during the acute phase of infection. The lowest concentration of SARS-CoV-2 viral copies this assay can detect is 250 copies / mL. A negative result does not preclude SARS-CoV-2 infection and should not be used as the sole basis for treatment or other patient management decisions.  A negative result may occur with improper specimen collection / handling, submission of specimen other than nasopharyngeal swab, presence of viral mutation(s) within the areas targeted  by this assay, and inadequate number of viral copies (<250 copies / mL). A negative result must be combined with clinical observations, patient history, and epidemiological information.  Fact Sheet for Patients:   RoadLapTop.co.za  Fact Sheet for Healthcare Providers: http://kim-miller.com/  This test is not yet approved or  cleared by the [place] FDA  and has been authorized for detection and/or diagnosis of SARS-CoV-2 by FDA under an Emergency Use Authorization (EUA).  This EUA will remain in effect (meaning this test can be used) for the duration of the COVID-19 declaration under Section 564(b)(1) of the Act, 21 U.S.C. section 360bbb-3(b)(1), unless the authorization is terminated or revoked sooner.  Performed at [facility], [address] [place], [place] [address]     Radiology Studies: CT ABDOMEN PELVIS W CONTRAST  Result Date: 12/07/2022 CLINICAL DATA:  Acute abdominal pain, urinary tract infection, intermittent vomiting EXAM: CT ABDOMEN AND PELVIS WITH CONTRAST TECHNIQUE: Multidetector CT imaging of the abdomen and pelvis was performed using the standard protocol following bolus administration of intravenous contrast. RADIATION DOSE REDUCTION: This exam was performed according to the departmental dose-optimization program which includes automated exposure control, adjustment of the mA and/or kV according to patient size and/or use of iterative reconstruction technique. CONTRAST:  80mL OMNIPAQUE IOHEXOL 300 MG/ML  SOLN COMPARISON:  06/17/2022, 04/29/2021, 04/29/2016 FINDINGS: Lower chest: No acute pleural or parenchymal lung disease. Hepatobiliary: Stable 1 cm hemangioma is again seen within the left lobe liver reference image 16/2. Indeterminate area of indistinct increased attenuation during the early phase of the exam reference image 20/2 measuring 1.7 cm likely reflects transient hepatic attenuation difference  or flash fill hemangioma. Numerous hepatic cysts are again noted. No intrahepatic biliary duct dilation. Prior cholecystectomy, with expected postsurgical dilatation of the common bile duct. No change in the partially calcified low-attenuation lesion adjacent to the left lobe liver, felt to represent benign hemangioma based on previous imaging findings. Pancreas: Chronic pancreatic duct dilation unchanged. Pancreatic divisum again noted. The pancreas is unremarkable without acute finding. Spleen: Normal in size without focal abnormality. Adrenals/Urinary Tract: Solitary left kidney demonstrates normal parenchymal enhancement. No urinary tract calculus or obstructive uropathy. Normal excretion of contrast on delayed imaging. The bilateral adrenals are unremarkable. The bladder is decompressed, limiting its evaluation. Evaluation of the lower pelvis is limited due to streak artifact from right hip arthroplasty. Stomach/Bowel: No bowel obstruction or ileus. No bowel wall thickening or inflammatory change. Vascular/Lymphatic: Aortic atherosclerosis. 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 No enlarged abdominal or pelvic lymph nodes. Reproductive: Status post hysterectomy. No adnexal masses. Evaluation of the lower pelvis is limited by streak artifact from right hip arthroplasty. Other: No free fluid or free intraperitoneal gas. No abdominal wall hernia. Musculoskeletal: Right hip arthroplasty. No acute or destructive bony lesions. Postsurgical changes from prior L3-4 laminectomy and posterior fusion. Reconstructed images demonstrate no additional findings. IMPRESSION: 1. No acute intra-abdominal or intrapelvic process. 2. Unremarkable solitary left kidney. Evaluation of the bladder is limited due to incomplete distension and streak artifact from right hip arthroplasty. If urinary tract infection remains a concern, correlation with urinalysis is recommended. 3. 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 4.  Aortic  Atherosclerosis (ICD10-I70.0). Electronically Signed   By: Sharlet Salina M.D.   On: 12/07/2022 15:41   DG Chest Portable 1 View  Result Date: 12/07/2022 CLINICAL DATA:  [place] EXAM: PORTABLE CHEST 1 VIEW COMPARISON:  05/27/2020 FINDINGS: Stable enlargement of the cardiopericardial silhouette. Atherosclerotic calcification of the aortic arch. The lungs appear clear.  No blunting of the costophrenic angles. Reverse lordotic projection possibly due to underlying kyphosis. IMPRESSION: 1. Stable enlargement of the cardiopericardial silhouette, without edema. 2. Reverse lordotic projection possibly due to underlying kyphosis. Electronically Signed   By: Gaylyn Rong M.D.   On: 12/07/2022 15:07      Azka Steger  Ludwig [facility]  If 7PM-7AM, please contact night-coverage [email or web address] 12/08/2022, 11:35 AM

## 2022-12-08 NOTE — Progress Notes (Signed)
  Transition of Care [facility]) Screening Note   Patient Details  Name: [name] Date of Birth: January 29, 1946   Transition of Care [facility]) CM/SW Contact:    Otelia Santee, LCSW Phone Number: 12/08/2022, 12:44 PM    Transition of Care Department [facility]) has reviewed patient and no TOC needs have been identified at this time. We will continue to monitor patient advancement through interdisciplinary progression rounds. If new patient transition needs arise, please place a TOC consult.

## 2022-12-09 ENCOUNTER — Encounter (HOSPITAL_COMMUNITY): Payer: Self-pay | Admitting: Student

## 2022-12-09 DIAGNOSIS — R112 Nausea with vomiting, unspecified: Secondary | ICD-10-CM | POA: Diagnosis not present

## 2022-12-09 LAB — BASIC METABOLIC PANEL
Anion gap: 12 (ref 5–15)
BUN: 13 mg/dL (ref 8–23)
CO2: 20 mmol/L — ABNORMAL LOW (ref 22–32)
Calcium: 10 mg/dL (ref 8.9–10.3)
Chloride: 102 mmol/L (ref 98–111)
Creatinine, Ser: 0.84 mg/dL (ref 0.44–1.00)
GFR, Estimated: >60 mL/min (ref >60)
Glucose, Bld: 110 mg/dL — ABNORMAL HIGH (ref 70–99)
Potassium: 3.9 mmol/L (ref 3.5–5.1)
Sodium: 134 mmol/L — ABNORMAL LOW (ref 135–145)

## 2022-12-09 LAB — URINE CULTURE: Culture: NO GROWTH

## 2022-12-09 LAB — CBC
HCT: 47.9 % — ABNORMAL HIGH (ref 36.0–46.0)
Hemoglobin: 16.4 g/dL — ABNORMAL HIGH (ref 12.0–15.0)
MCH: 31.4 pg (ref 26.0–34.0)
MCHC: 34.2 g/dL (ref 30.0–36.0)
MCV: 91.6 fL (ref 80.0–100.0)
Platelets: 332 10*3/uL (ref 150–400)
RBC: 5.23 MIL/uL — ABNORMAL HIGH (ref 3.87–5.11)
RDW: 12.6 % (ref 11.5–15.5)
WBC: 15.4 10*3/uL — ABNORMAL HIGH (ref 4.0–10.5)
nRBC: 0 % (ref 0.0–0.2)

## 2022-12-09 LAB — VITAMIN B12: Vitamin B-12: 764 pg/mL (ref 180–914)

## 2022-12-09 LAB — FOLATE: Folate: 13.5 ng/mL (ref >5.9)

## 2022-12-09 MED ORDER — HYDRALAZINE HCL 20 MG/ML IJ SOLN: 10.0000 mg | Freq: Four times a day (QID) | INTRAMUSCULAR | Status: DC | PRN

## 2022-12-09 MED ORDER — AMLODIPINE BESYLATE 5 MG PO TABS
5.0000 mg | ORAL_TABLET | Freq: Every day | ORAL | Status: AC
  Administered 2022-12-09: 5 mg via ORAL
  Filled 2022-12-09: qty 1

## 2022-12-09 MED ORDER — ONDANSETRON HCL 4 MG/2ML IJ SOLN
4.0000 mg | Freq: Four times a day (QID) | INTRAMUSCULAR | Status: DC | PRN
  Administered 2022-12-09 – 2022-12-11 (×4): 4 mg via INTRAVENOUS
  Filled 2022-12-09 (×4): qty 2

## 2022-12-09 MED ORDER — AMLODIPINE BESYLATE 5 MG PO TABS
5.0000 mg | ORAL_TABLET | Freq: Every day | ORAL | Status: DC
  Administered 2022-12-09: 5 mg via ORAL
  Filled 2022-12-09: qty 1

## 2022-12-09 MED ORDER — BISOPROLOL FUMARATE 5 MG PO TABS
5.0000 mg | ORAL_TABLET | Freq: Every day | ORAL | Status: DC
  Administered 2022-12-10: 5 mg via ORAL
  Filled 2022-12-09: qty 1

## 2022-12-09 MED ORDER — VANCOMYCIN HCL 125 MG PO CAPS
125.0000 mg | ORAL_CAPSULE | Freq: Four times a day (QID) | ORAL | Status: DC
  Administered 2022-12-09 – 2022-12-15 (×23): 125 mg via ORAL
  Filled 2022-12-09 (×28): qty 1

## 2022-12-09 MED ORDER — SODIUM CHLORIDE 0.9 % IV SOLN: INTRAVENOUS | Status: DC

## 2022-12-09 MED ORDER — HYDRALAZINE HCL 20 MG/ML IJ SOLN: 5.0000 mg | Freq: Four times a day (QID) | INTRAMUSCULAR | Status: DC | PRN

## 2022-12-09 MED ORDER — AMLODIPINE BESYLATE 10 MG PO TABS
10.0000 mg | ORAL_TABLET | Freq: Every day | ORAL | Status: DC
  Administered 2022-12-10 – 2022-12-14 (×5): 10 mg via ORAL
  Filled 2022-12-09 (×6): qty 1

## 2022-12-09 NOTE — Progress Notes (Signed)
She was being assisted to the [facility]  She was agitated. Patient had an episode of increased HR 154 once  with RVR. NP notified, orders received for EKG and vitals. But both patient her daughter refused implementation of orders  dtr. stated it will make her more agitated.  Patient has now returned to baseline afib and pulse is 105.  NP aware.

## 2022-12-09 NOTE — Progress Notes (Signed)
PT Cancellation Note  Patient Details Name: [name] MRN: [id number] DOB: Jul 15, 1946   Cancelled Treatment:    Reason Eval/Treat Not Completed: Medical issues which prohibited therapy (per RN pt is very nauseous at present, pt unable to participate in PT. Will follow.)   Tamala Ser PT 12/09/2022  Acute Rehabilitation Services  Office [phone]

## 2022-12-09 NOTE — Progress Notes (Signed)
OT Cancellation Note  Patient Details Name: [name] MRN: [id number] DOB: October 21, 1945   Cancelled Treatment:    Reason Eval/Treat Not Completed: Fatigue/lethargy limiting ability to participate (Pt sleeping after vomiting all night per nurse, will continue to follow.)  Evern Bio 12/09/2022, 12:40 PM Berna Spare, OTR/L Acute Rehabilitation Services Office: [phone]

## 2022-12-09 NOTE — Progress Notes (Signed)
PROGRESS NOTE  [name]  EAV:[id number] DOB: Feb 19, 1946 DOA: 12/07/2022 PCP: Chilton Greathouse, MD   Brief Narrative: Patient is a 77 year old female with history of chronic A-fib on Eliquis, hypothyroidism, hypertension, hyperlipidemia, GERD, depression, persistent urinary symptoms who presented with nausea, abdominal pain, vomiting, diarrhea, poor oral intake.  She follows with urology for recurrent UTI  symptoms, urine culture has been persistently negative.  She was on methenamine prophylactically.  On presentation, she was mildly febrile, tachycardic.  He consistent with pyuria.  C. difficile came out to be positive with negative toxin.  Started on oral vancomycin.  Hospital course remarkable for persistent nausea  Assessment & Plan:  Principal Problem:   Intractable nausea and vomiting Active Problems:   HTN (hypertension)   Persistent atrial fibrillation   Hypokalemia   Hypothyroidism   Acute metabolic encephalopathy   Pyuria   Hyponatremia   Malnutrition of moderate degree  Intractable nausea/vomiting/decreased oral intake: Unclear etiology.  Appears frail on presentation.  CT abdomen/pelvis Without any acute findings.  On full liquid diet.  Dietitian following.  Currently on Remeron, multivitamin.. Will advance diet slowly.  If she continues to have persistent nausea, will consult GI  Suspected UTI: Has chronic urinary symptoms.  Urine cultures positive and negative.  Has been tried on different antibiotics in the past.  Also on methenamine at home.  Follows with urology as an outpatient.  Since he presented with fever and some leukocytosis, started on ceftriaxone.  Follow-up urine culture.  Moderate malnutrition: Dietitian following.  Started on multivitamins, boost breeze.  Diarrhea/C. difficile: Has been having frequent diarrhea.  No signs of colitis on CT.  Has mild leukocytosis.  C. difficile came out to be positive without toxin.  Since she is symptomatic, will  treat.  Started on oral vancomycin.  Has leukocytosis and some abdominal discomfort.  Abnormal thyroid function test/history of hypothyroidism: TSH is low, free T4 slightly high.  Unclear if she is hyperthyroid state now.  Takes levothyroxine for hypothyroidism which we will hold for now.  We recommend to follow-up with endocrinology as an outpatient.  Will contact endocrine when she is close discharge  Hypokalemia: Currently being monitored and supplemented.  Magnesium was supplemented on 4/18  Leukocytosis: Mild.  Continue to monitor  Acute metabolic encephalopathy: Seems to have resolved.  Frequent reorientation, delirium precaution  Persistent A-fib: On Eliquis, bisoprolol  Hypertension: Blood pressure up.  On bisoprolol .  Might add amlodipine  Debility/deconditioning: Lives with husband.  Ambulates with the help of walker at home.  Has bilateral knee arthritis, follows with orthopedics.  PT consulted   Nutrition Problem: Moderate Malnutrition Etiology: social / environmental circumstances (depression)    DVT prophylaxis: apixaban (ELIQUIS) tablet 5 mg     Code Status: Full Code  Family Communication: Husband at bedside  Patient status:Inpatient  Patient is from :Home  Anticipated discharge NW:GNFA  Estimated DC date: 2 to 3 days   Consultants: None  Procedures:None  Antimicrobials:  Anti-infectives (From admission, onward)    Start     Dose/Rate Route Frequency Ordered Stop   12/08/22 1600  cefTRIAXone (ROCEPHIN) 1 g in sodium chloride 0.9 % 100 mL IVPB        1 g 200 mL/hr over 30 Minutes Intravenous Daily 12/08/22 1456     12/07/22 2245  ciprofloxacin (CIPRO) IVPB 400 mg        400 mg 200 mL/hr over 60 Minutes Intravenous Every 12 hours 12/07/22 2145 12/08/22 1912  Subjective:  Patient seen and examined at bedside today.  Hemodynamically stable.  States that he feels better. Says he is still nauseous and having diarrhea.  Rectal tube was placed  yesterday.  She says she  cant advance the diet beyond full liquid.  Husband at bedside.  Complains of some abdominal discomfort. Objective: Vitals:   12/08/22 1947 12/08/22 2210 12/08/22 2300 12/09/22 0412  BP: (!) 161/115 (!) 170/104 (!) 161/98 (!) 174/95  Pulse: 92 90 95 100  Resp: Temp: (!) 97.3 F (36.3 C) 99 F (37.2 C) 98 F (36.7 C) 98.2 F (36.8 C)  TempSrc: Oral Oral    SpO2: 96%   100%  Weight:      Height:        Intake/Output Summary (Last 24 hours) at 12/09/2022 0802 Last data filed at 12/08/2022 1201 Gross per 24 hour  Intake 356 ml  Output --  Net 356 ml   Filed Weights   12/08/22 0427  Weight: 52.4 kg    Examination:  General exam: Overall comfortable, not in distress, appears weak HEENT: PERRL Respiratory system:  no wheezes or crackles  Cardiovascular system: S1 & S2 heard, RRR.  Gastrointestinal system: Abdomen is nondistended, soft and nontender.  Bowel sounds present Central nervous system: Alert and oriented Extremities: No edema, no clubbing ,no cyanosis Skin: No rashes, no ulcers,no icterus   GU: rectal tube   Data Reviewed: I have personally reviewed following labs and imaging studies  CBC: Recent Labs  Lab 12/05/22 1226 12/07/22 1301 12/08/22 0528  WBC 6.5 11.3* 13.1*  NEUTROABS  --  9.4*  --   HGB 13.1 15.5* 15.7*  HCT 39.5 45.0 46.3*  MCV 94.0 89.5 91.1  PLT 241 362 319   Basic Metabolic Panel: Recent Labs  Lab 12/05/22 1226 12/07/22 1301 12/08/22 0528  NA 136 132* 134*  K 3.3* 3.3* 2.8*  CL 102 97* 100  CO2 GLUCOSE 126* 118* 105*  BUN CREATININE 0.97 0.90 0.88  CALCIUM 9.7 10.3 10.1  MG  --   --  1.7     Recent Results (from the past 240 hour(s))  Urine Culture     Status: None   Collection Time: 12/05/22 12:26 PM   Specimen: Urine, Clean Catch  Result Value Ref Range Status   Specimen Description   Final    URINE, CLEAN CATCH Performed at [facility], [address] [place], [place] [address]    Special Requests   Final    NONE Performed at [facility], [address], [place], [place] [address]    Culture   Final    NO GROWTH Performed at [facility], [address] [place], [place] [address]    Report Status 12/06/2022 FINAL  Final  SARS Coronavirus 2 by RT PCR (hospital order, performed in [facility] hospital lab) *cepheid single result test* Anterior Nasal Swab     Status: None   Collection Time: 12/07/22  6:35 PM   Specimen: Anterior Nasal Swab  Result Value Ref Range Status   SARS Coronavirus 2 by RT PCR NEGATIVE NEGATIVE Final    Comment: (NOTE) SARS-CoV-2 target nucleic acids are NOT DETECTED.  The SARS-CoV-2 RNA is generally detectable in upper and lower respiratory specimens during the acute phase of infection. The lowest concentration of SARS-CoV-2 viral copies this assay can detect is 250 copies / mL. A negative  result does not preclude SARS-CoV-2 infection and should not be used as the sole basis for treatment or other patient management decisions.  A negative result may occur with improper specimen collection / handling, submission of specimen other than nasopharyngeal swab, presence of viral mutation(s) within the areas targeted by this assay, and inadequate number of viral copies (<250 copies / mL). A negative result must be combined with clinical observations, patient history, and epidemiological information.  Fact Sheet for Patients:   RoadLapTop.co.za  Fact Sheet for Healthcare Providers: http://kim-miller.com/  This test is not yet approved or  cleared by the [place] FDA and has been authorized for detection and/or diagnosis of SARS-CoV-2 by FDA under an Emergency Use Authorization (EUA).  This EUA will remain in effect (meaning this test can be used) for the duration of the COVID-19 declaration under Section 564(b)(1) of the  Act, 21 U.S.C. section 360bbb-3(b)(1), unless the authorization is terminated or revoked sooner.  Performed at [facility], [address] [place], [place] [address]   C Difficile Quick Screen w PCR reflex     Status: Abnormal   Collection Time: 12/08/22 11:07 AM   Specimen: STOOL  Result Value Ref Range Status   C Diff antigen POSITIVE (A) NEGATIVE Final   C Diff toxin NEGATIVE NEGATIVE Final   C Diff interpretation Results are indeterminate. See PCR results.  Final    Comment: Performed at [facility], [address], [place], [place] [address]  C. Diff by PCR, Reflexed     Status: Abnormal   Collection Time: 12/08/22 11:07 AM  Result Value Ref Range Status   Toxigenic C. Difficile by PCR POSITIVE (A) NEGATIVE Final    Comment: Positive for toxigenic C. difficile with little to no toxin production. Only treat if clinical presentation suggests symptomatic illness. Performed at [facility], [address], [place], [place] [address]      Radiology Studies: CT ABDOMEN PELVIS W CONTRAST  Result Date: 12/07/2022 CLINICAL DATA:  Acute abdominal pain, urinary tract infection, intermittent vomiting EXAM: CT ABDOMEN AND PELVIS WITH CONTRAST TECHNIQUE: Multidetector CT imaging of the abdomen and pelvis was performed using the standard protocol following bolus administration of intravenous contrast. RADIATION DOSE REDUCTION: This exam was performed according to the departmental dose-optimization program which includes automated exposure control, adjustment of the mA and/or kV according to patient size and/or use of iterative reconstruction technique. CONTRAST:  80mL OMNIPAQUE IOHEXOL 300 MG/ML  SOLN COMPARISON:  06/17/2022, 04/29/2021, 04/29/2016 FINDINGS: Lower chest: No acute pleural or parenchymal lung disease. Hepatobiliary: Stable 1 cm hemangioma is again seen within the left lobe liver reference image 16/2. Indeterminate area of indistinct  increased attenuation during the early phase of the exam reference image 20/2 measuring 1.7 cm likely reflects transient hepatic attenuation difference or flash fill hemangioma. Numerous hepatic cysts are again noted. No intrahepatic biliary duct dilation. Prior cholecystectomy, with expected postsurgical dilatation of the common bile duct. No change in the partially calcified low-attenuation lesion adjacent to the left lobe liver, felt to represent benign hemangioma based on previous imaging findings. Pancreas: Chronic pancreatic duct dilation unchanged. Pancreatic divisum again noted. The pancreas is unremarkable without acute finding. Spleen: Normal in size without focal abnormality. Adrenals/Urinary Tract: Solitary left kidney demonstrates normal parenchymal enhancement. No urinary tract calculus or obstructive uropathy. Normal excretion of contrast on delayed imaging. The bilateral adrenals are unremarkable. The bladder is decompressed, limiting its evaluation. Evaluation of the lower pelvis is limited due  to streak artifact from right hip arthroplasty. Stomach/Bowel: No bowel obstruction or ileus. No bowel wall thickening or inflammatory change. Vascular/Lymphatic: Aortic atherosclerosis. 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 No enlarged abdominal or pelvic lymph nodes. Reproductive: Status post hysterectomy. No adnexal masses. Evaluation of the lower pelvis is limited by streak artifact from right hip arthroplasty. Other: No free fluid or free intraperitoneal gas. No abdominal wall hernia. Musculoskeletal: Right hip arthroplasty. No acute or destructive bony lesions. Postsurgical changes from prior L3-4 laminectomy and posterior fusion. Reconstructed images demonstrate no additional findings. IMPRESSION: 1. No acute intra-abdominal or intrapelvic process. 2. Unremarkable solitary left kidney. Evaluation of the bladder is limited due to incomplete distension and streak artifact from right hip arthroplasty.  If urinary tract infection remains a concern, correlation with urinalysis is recommended. 3. 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 4.  Aortic Atherosclerosis (ICD10-I70.0). Electronically Signed   By: Sharlet Salina M.D.   On: 12/07/2022 15:41   DG Chest Portable 1 View  Result Date: 12/07/2022 CLINICAL DATA:  [place]alaise EXAM: PORTABLE CHEST 1 VIEW COMPARISON:  05/27/2020 FINDINGS: Stable enlargement of the cardiopericardial silhouette. Atherosclerotic calcification of the aortic arch. The lungs appear clear.  No blunting of the costophrenic angles. Reverse lordotic projection possibly due to underlying kyphosis. IMPRESSION: 1. Stable enlargement of the cardiopericardial silhouette, without edema. 2. Reverse lordotic projection possibly due to underlying kyphosis. Electronically Signed   By: Gaylyn Rong M.D.   On: 12/07/2022 15:07    Scheduled Meds:  apixaban  5 mg Oral BID   bisoprolol  2.5 mg Oral Daily   feeding supplement  1 Container Oral TID BM   fentaNYL  1 patch Transdermal Q72H   levothyroxine  88 mcg Oral Q0600   mirtazapine  15 mg Oral QHS   multivitamin with minerals  1 tablet Oral Daily   Continuous Infusions:  0.9 % NaCl with KCl 40 mEq / L 100 mL/hr at 12/08/22 2121   cefTRIAXone (ROCEPHIN)  IV 1 g (12/08/22 1717)   promethazine (PHENERGAN) injection (IM or IVPB) 12.5 mg (12/09/22 0517)     LOS: 1 day   Burnadette Pop, MD [facility] P4/20/2024, 8:02 AM

## 2022-12-10 ENCOUNTER — Encounter (HOSPITAL_COMMUNITY): Payer: Self-pay | Admitting: Student

## 2022-12-10 DIAGNOSIS — R112 Nausea with vomiting, unspecified: Secondary | ICD-10-CM | POA: Diagnosis not present

## 2022-12-10 LAB — CBC
HCT: 47.3 % — ABNORMAL HIGH (ref 36.0–46.0)
Hemoglobin: 16.3 g/dL — ABNORMAL HIGH (ref 12.0–15.0)
MCH: 31.1 pg (ref 26.0–34.0)
MCHC: 34.5 g/dL (ref 30.0–36.0)
MCV: 90.3 fL (ref 80.0–100.0)
Platelets: 354 10*3/uL (ref 150–400)
RBC: 5.24 MIL/uL — ABNORMAL HIGH (ref 3.87–5.11)
RDW: 12.6 % (ref 11.5–15.5)
WBC: 14.1 10*3/uL — ABNORMAL HIGH (ref 4.0–10.5)
nRBC: 0 % (ref 0.0–0.2)

## 2022-12-10 LAB — BASIC METABOLIC PANEL
Anion gap: 11 (ref 5–15)
BUN: 16 mg/dL (ref 8–23)
CO2: 22 mmol/L (ref 22–32)
Calcium: 9.6 mg/dL (ref 8.9–10.3)
Chloride: 100 mmol/L (ref 98–111)
Creatinine, Ser: 0.65 mg/dL (ref 0.44–1.00)
GFR, Estimated: >60 mL/min (ref >60)
Glucose, Bld: 109 mg/dL — ABNORMAL HIGH (ref 70–99)
Potassium: 2.9 mmol/L — ABNORMAL LOW (ref 3.5–5.1)
Sodium: 133 mmol/L — ABNORMAL LOW (ref 135–145)

## 2022-12-10 LAB — BLOOD GAS, ARTERIAL
Acid-Base Excess: 0.9 mmol/L (ref 0.0–2.0)
Bicarbonate: 23.8 mmol/L (ref 20.0–28.0)
O2 Saturation: 98.6 %
Patient temperature: 36.8
pCO2 arterial: 32 mmHg (ref 32–48)
pH, Arterial: 7.48 — ABNORMAL HIGH (ref 7.35–7.45)
pO2, Arterial: 78 mmHg — ABNORMAL LOW (ref 83–108)

## 2022-12-10 LAB — GLUCOSE, CAPILLARY: Glucose-Capillary: 99 mg/dL (ref 70–99)

## 2022-12-10 MED ORDER — NALOXONE HCL 0.4 MG/ML IJ SOLN
0.4000 mg | INTRAMUSCULAR | Status: DC | PRN
  Administered 2022-12-11 – 2022-12-12 (×8): 0.4 mg via INTRAVENOUS
  Filled 2022-12-10 (×9): qty 1

## 2022-12-10 MED ORDER — POTASSIUM CHLORIDE CRYS ER 20 MEQ PO TBCR
40.0000 meq | EXTENDED_RELEASE_TABLET | ORAL | Status: AC
  Administered 2022-12-10 (×2): 40 meq via ORAL
  Filled 2022-12-10 (×2): qty 2

## 2022-12-10 MED ORDER — BISOPROLOL FUMARATE 5 MG PO TABS
5.0000 mg | ORAL_TABLET | Freq: Once | ORAL | Status: AC
  Administered 2022-12-10: 5 mg via ORAL
  Filled 2022-12-10: qty 1

## 2022-12-10 MED ORDER — BISOPROLOL FUMARATE 5 MG PO TABS
10.0000 mg | ORAL_TABLET | Freq: Every day | ORAL | Status: DC
  Administered 2022-12-11 – 2022-12-12 (×2): 10 mg via ORAL
  Filled 2022-12-10 (×3): qty 2

## 2022-12-10 NOTE — Significant Event (Signed)
Rapid Response Event Note   Reason for Call :  Unresponsive  Initial Focused Assessment:  Upon arrival MD [facility] at bedside, patient appears unresponsive, but breathing and maintaining airway, pulse regular. Vital signs stable BP 159/93 pulse 100, respirations 25. CBG 99. Per Natale Milch ordered 0.4 of narcan, and removal of fentanyl patch. After administration of Narcan patient alert to voice and A/O x4. Patient still requesting to rest due to being up all night. Per MD [place] ABG, BMP, and CBC ordered. Patient's face symmetrical, grips equal, pupils pin point.     Plan of Care:  Continue current level and POC, report any critical lab values to MD. If patient becomes unresponsive please reach out to MD and Rapid response. Feel free to reach out to rapid response for any other concerns.   MD Notified: Amrit MD Call Time: 1129 Arrival Time: 1135 End Time: 1155  Teresita Madura, RN

## 2022-12-10 NOTE — Progress Notes (Addendum)
As I entered the patient's room the daughter states something is wrong with her mother. She said her mother's not responding to her like she was an hour ago. Sternal rub done and patient responded with a yell. Then Damonique did not respond to voices again. Dr Marcelline Mates was walking by the room and came in to see if he could help. Fentanyl patch on Right arm removed, it was 25 mcg an applied Friday April 19th. Stepheny uses the Fentanyl patch at home 37 mcg. Dr Renford Dills notified and also Rapid Response. Narcan 0.4 mg IV given per charge nurse ,Irving Burton. Endiya then woke up and started talking. 11:53- Bp-154/100 ,P-98 R-22.  CBG-99.
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
## 2022-12-10 NOTE — Evaluation (Signed)
Occupational Therapy Evaluation Patient Details Name: [name] MRN: [id number] DOB: 1946-04-29 Today's Date: 12/10/2022   History of Present Illness 77 y.o. 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 Dx of cdiff, possible UTI.   Clinical Impression   Pt admitted with the above. Pt currently with functional limitations due to the deficits listed below (see OT Problem List).  Pt will benefit from acute skilled OT to increase their safety and independence with ADL and functional mobility for ADL to facilitate discharge.          Recommendations for follow up therapy are one component of a multi-disciplinary discharge planning process, led by the attending physician.  Recommendations may be updated based on patient status, additional functional criteria and insurance authorization.   Assistance Recommended at Discharge Frequent or constant Supervision/Assistance  Patient can return home with the following A lot of help with walking and/or transfers;A lot of help with bathing/dressing/bathroom    Functional Status Assessment  Patient has had a recent decline in their functional status and demonstrates the ability to make significant improvements in function in a reasonable and predictable amount of time.  Equipment Recommendations  BSC/3in1    Recommendations for Other Services       Precautions / Restrictions Precautions Precautions: Fall Restrictions Weight Bearing Restrictions: No      Mobility Bed Mobility            Pt declined        Transfers              Pt declined                 ADL either performed or assessed with clinical judgement   ADL Overall ADL's : Needs assistance/impaired     Grooming: Wash/dry face;Bed level;Set  up Grooming Details (indicate cue type and reason): HOB raised                               General ADL Comments: limited eval as pt was tired from her morning. OT did speak to her daughter and husband. Pt will need post acute rehab     Vision Patient Visual Report: No change from baseline              Pertinent Vitals/Pain Pain Assessment Pain Assessment: No/denies pain     Hand Dominance     Extremity/Trunk Assessment Upper Extremity Assessment Upper Extremity Assessment: Generalized weakness (elevated BUE on pillows after AROM to decrease pressure on elbows.)           Communication Communication Communication: No difficulties   Cognition Arousal/Alertness: Awake/alert Behavior During Therapy: WFL for tasks assessed/performed Overall Cognitive Status: Within Functional Limits for tasks assessed                                                  Home Living Family/patient expects to be discharged to:: Private residence Living Arrangements: Spouse/significant other Available Help at Discharge: Family;Available PRN/intermittently Type of Home:  (townhome) Home Access: Level entry     Home Layout: One level  Home Equipment: Rollator (4 wheels);Shower seat;BSC/3in1;Wheelchair - manual;Grab bars - tub/shower          Prior Functioning/Environment Prior Level of Function : Independent/Modified Independent             Mobility Comments: uses walker ADLs Comments: bathing/dressing MOD I        OT Problem List: Decreased strength;Decreased activity tolerance;Impaired balance (sitting and/or standing)      OT Treatment/Interventions: Self-care/ADL training;Patient/family education;Therapeutic activities;Therapeutic exercise    OT Goals(Current goals can be found in the care plan section) Acute Rehab OT Goals Patient Stated Goal: get well and get a bath tomorrow OT Goal Formulation: With patient Time  For Goal Achievement: 12/24/22 Potential to Achieve Goals: Good ADL Goals Pt Will Perform Eating: with set-up;sitting Pt Will Perform Grooming: with set-up;sitting Pt Will Perform Toileting - Clothing Manipulation and hygiene: with min assist;sitting/lateral leans;sit to/from stand Pt/caregiver will Perform Home Exercise Program: Increased ROM;Increased strength;Both right and left upper extremity;With Supervision  OT Frequency: Min 2X/week       AM-PAC OT "6 Clicks" Daily Activity     Outcome Measure Help from another person eating meals?: A Little Help from another person taking care of personal grooming?: A Little Help from another person toileting, which includes using toliet, bedpan, or urinal?: Total Help from another person bathing (including washing, rinsing, drying)?: Total Help from another person to put on and taking off regular upper body clothing?: Total Help from another person to put on and taking off regular lower body clothing?: Total 6 Click Score: 10   End of Session    Activity Tolerance: Patient limited by fatigue Patient left: in bed;with call bell/phone within reach;with family/visitor present  OT Visit Diagnosis: Muscle weakness (generalized) (M62.81)                Time: 3244-0102 OT Time Calculation (min): 16 min Charges:  OT General Charges $OT Visit: 1 Visit OT Evaluation $OT Eval Moderate Complexity: 1 Mod  Lise Auer, OT Acute Rehabilitation Services [phone] Office- [phone]     Jeanie Sewer, Karin Golden D 12/10/2022, 5:33 PM

## 2022-12-10 NOTE — Progress Notes (Signed)
PROGRESS NOTE  [name]  ZOX:[id number] DOB: 26-Jun-1946 DOA: 12/07/2022 PCP: Chilton Greathouse, MD   Brief Narrative: Patient is a 77 year old female with history of chronic A-fib on Eliquis, hypothyroidism, hypertension, hyperlipidemia, GERD, depression, persistent urinary symptoms who presented with nausea, abdominal pain, vomiting, diarrhea, poor oral intake.  She follows with urology for recurrent UTI  symptoms, urine culture has been persistently negative.  She was on methenamine prophylactically.  On presentation, she was mildly febrile, tachycardic.  He consistent with pyuria.  C. difficile came out to be positive with negative toxin.  Started on oral vancomycin.  Hospital course remarkable for persistent nausea.  GI consulted.  Assessment & Plan:  Principal Problem:   Intractable nausea and vomiting Active Problems:   HTN (hypertension)   Persistent atrial fibrillation   Hypokalemia   Hypothyroidism   Acute metabolic encephalopathy   Pyuria   Hyponatremia   Malnutrition of moderate degree  Intractable nausea/vomiting/decreased oral intake: Unclear etiology.  Appeared frail on presentation.  CT abdomen/pelvis Without any acute findings.  On full liquid diet.  Dietitian following.  Currently on Remeron, multivitamin.. Will advance diet slowly.  GI consulted today to rule out esophageal/gastric etiology for nausea, vomiting  Suspected UTI: Has chronic urinary symptoms.    Has been tried on different antibiotics in the past.  Also on methenamine at home.  Follows with urology as an outpatient.  Since she presented with fever and some leukocytosis, started on ceftriaxone.  Urine culture has not shown any growth.  Antibiotics discontinued  Moderate malnutrition: Dietitian following.  Started on multivitamins, boost breeze.  Diarrhea/C. difficile: Has been having frequent diarrhea.  No signs of colitis on CT.  Has mild leukocytosis.  C. difficile came out to be positive without  toxin.  Since she is symptomatic, will treat.  Started on oral vancomycin.  Has leukocytosis and some abdominal discomfort.  Diarrhea is slowing down  Abnormal thyroid function test/history of hypothyroidism: TSH is low, free T4 slightly high.  Unclear if she is hyperthyroid state now.  Takes levothyroxine for hypothyroidism which we will hold for now.  We recommend to follow-up with endocrinology as an outpatient.   Hypokalemia: Currently being monitored and supplemented as needed.  Magnesium was supplemented on 4/18  Leukocytosis: Mild.  Continue to monitor  Acute metabolic encephalopathy: Confusion presentation, resolved.  Was alert and oriented this morning.  Rapid response called this morning after she became unresponsive.  Thought to be secondary to fentanyl.  Fentanyl patch disconnected, Narcan given.  Remains alert and oriented again  Persistent A-fib: On Eliquis, bisoprolol.  Hypertension: Blood pressure up.  Dose of bisoprolol increased, added amlodipine.  Debility/deconditioning: Lives with husband.  Ambulates with the help of walker at home.  Has bilateral knee arthritis, follows with orthopedics.  PT /OT consulted   Nutrition Problem: Moderate Malnutrition Etiology: social / environmental circumstances (depression)    DVT prophylaxis: apixaban (ELIQUIS) tablet 5 mg     Code Status: Full Code  Family Communication: Daughter at bedside  Patient status:Inpatient  Patient is from :Home  Anticipated discharge WJ:XBJY  Estimated DC date: 2 to 3 days   Consultants: GI  Procedures:None  Antimicrobials:  Anti-infectives (From admission, onward)    Start     Dose/Rate Route Frequency Ordered Stop   12/09/22 1200  vancomycin (VANCOCIN) capsule 125 mg        125 mg Oral 4 times daily 12/09/22 0957     12/08/22 1600  cefTRIAXone (ROCEPHIN) 1 g in sodium  chloride 0.9 % 100 mL IVPB  Status:  Discontinued        1 g 200 mL/hr over 30 Minutes Intravenous Daily 12/08/22  1456 12/10/22 1229   12/07/22 2245  ciprofloxacin (CIPRO) IVPB 400 mg        400 mg 200 mL/hr over 60 Minutes Intravenous Every 12 hours 12/07/22 2145 12/08/22 1912       Subjective:  Patient seen and examined the bedside this morning.  During my evaluation, she was alert and oriented.  She said she feels little better today.  Nausea is slightly improved but not entirely gone.  Denies abdomen pain.  Diarrhea has slowed down.  We discussed about GI consultation with the daughter at bedside. Rapid response called later after she was found to be unresponsive, given Narcan and when I evaluated her at bedside, she was alert and oriented again   Objective: Vitals:   12/10/22 1153 12/10/22 1250 12/10/22 1251 12/10/22 1333  BP: (!) 154/100 (!) 174/111 (!) 153/103 (!) 153/103  Pulse: 98 (!) 110 (!) 111   Resp:  18 20   Temp:   99.1 F (37.3 C)   TempSrc:   Oral   SpO2: 98% 99% 99%   Weight:      Height:        Intake/Output Summary (Last 24 hours) at 12/10/2022 1419 Last data filed at 12/10/2022 0810 Gross per 24 hour  Intake --  Output 1050 ml  Net -1050 ml   Filed Weights   12/08/22 0427  Weight: 52.4 kg    Examination:  General exam: Weak, deconditioned, chronically ill looking HEENT: PERRL Respiratory system:  no wheezes or crackles  Cardiovascular system: Irregular regular rhythm Gastrointestinal system: Abdomen is nondistended, soft and nontender. Central nervous system: Alert and oriented Extremities: No edema, no clubbing ,no cyanosis Skin: No rashes, no ulcers,no icterus  : Rectal tube   Data Reviewed: I have personally reviewed following labs and imaging studies  CBC: Recent Labs  Lab 12/05/22 1226 12/07/22 1301 12/08/22 0528 12/09/22 0839  WBC 6.5 11.3* 13.1* 15.4*  NEUTROABS  --  9.4*  --   --   HGB 13.1 15.5* 15.7* 16.4*  HCT 39.5 45.0 46.3* 47.9*  MCV 94.0 89.5 91.1 91.6  PLT 241 362 319 332   Basic Metabolic Panel: Recent Labs  Lab  12/05/22 1226 12/07/22 1301 12/08/22 0528 12/09/22 0839  NA 136 132* 134* 134*  K 3.3* 3.3* 2.8* 3.9  CL 102 97* 100 102  CO2 27 24 24  20*  GLUCOSE 126* 118* 105* 110*  BUN 16 17 13 13   CREATININE 0.97 0.90 0.88 0.84  CALCIUM 9.7 10.3 10.1 10.0  MG  --   --  1.7  --      Recent Results (from the past 240 hour(s))  Urine Culture     Status: None   Collection Time: 12/05/22 12:26 PM   Specimen: Urine, Clean Catch  Result Value Ref Range Status   Specimen Description   Final    URINE, CLEAN CATCH Performed at [facility], [address] [place], [place] [address]    Special Requests   Final    NONE Performed at [facility], [address] [place], [place] [address]    Culture   Final    NO GROWTH Performed at [facility], [address], [place], [place] [address]    Report Status 12/06/2022 FINAL  Final  Urine Culture (for pregnant,  neutropenic or urologic patients or patients with an indwelling urinary catheter)     Status: None   Collection Time: 12/07/22  2:45 PM   Specimen: Urine, Clean Catch  Result Value Ref Range Status   Specimen Description   Final    URINE, CLEAN CATCH Performed at [facility], [address] [place], [place] [address]    Special Requests   Final    NONE Performed at [facility], [address] [place], [place] [address]    Culture   Final    NO GROWTH Performed at [facility], [address] [place], [place] [address]    Report Status 12/09/2022 FINAL  Final  SARS Coronavirus 2 by RT PCR (hospital order, performed in [facility] hospital lab) *cepheid single result test* Anterior Nasal Swab     Status: None   Collection Time: 12/07/22  6:35 PM   Specimen: Anterior Nasal Swab  Result Value Ref Range Status   SARS Coronavirus 2 by RT PCR NEGATIVE NEGATIVE Final    Comment: (NOTE) SARS-CoV-2 target nucleic acids are NOT DETECTED.  The  SARS-CoV-2 RNA is generally detectable in upper and lower respiratory specimens during the acute phase of infection. The lowest concentration of SARS-CoV-2 viral copies this assay can detect is 250 copies / mL. A negative result does not preclude SARS-CoV-2 infection and should not be used as the sole basis for treatment or other patient management decisions.  A negative result may occur with improper specimen collection / handling, submission of specimen other than nasopharyngeal swab, presence of viral mutation(s) within the areas targeted by this assay, and inadequate number of viral copies (<250 copies / mL). A negative result must be combined with clinical observations, patient history, and epidemiological information.  Fact Sheet for Patients:   RoadLapTop.co.za  Fact Sheet for Healthcare Providers: http://kim-miller.com/  This test is not yet approved or  cleared by the [place] FDA and has been authorized for detection and/or diagnosis of SARS-CoV-2 by FDA under an Emergency Use Authorization (EUA).  This EUA will remain in effect (meaning this test can be used) for the duration of the COVID-19 declaration under Section 564(b)(1) of the Act, 21 U.S.C. section 360bbb-3(b)(1), unless the authorization is terminated or revoked sooner.  Performed at [facility], [address], [place], [place] [address]   C Difficile Quick Screen w PCR reflex     Status: Abnormal   Collection Time: 12/08/22 11:07 AM   Specimen: STOOL  Result Value Ref Range Status   C Diff antigen POSITIVE (A) NEGATIVE Final   C Diff toxin NEGATIVE NEGATIVE Final   C Diff interpretation Results are indeterminate. See PCR results.  Final    Comment: Performed at [facility], [address] [place], [place] [address]  C. Diff by PCR, Reflexed     Status: Abnormal   Collection Time: 12/08/22 11:07 AM  Result Value Ref  Range Status   Toxigenic C. Difficile by PCR POSITIVE (A) NEGATIVE Final    Comment: Positive for toxigenic C. difficile with little to no toxin production. Only treat if clinical presentation suggests symptomatic illness. Performed at [facility], [address] [place], [place] [address]      Radiology Studies: No results found.  Scheduled Meds:  amLODipine  10 mg Oral Daily   apixaban  5 mg Oral BID   bisoprolol  5 mg Oral Daily   feeding supplement  1 Container Oral TID BM   mirtazapine  15 mg Oral QHS   multivitamin with minerals  1 tablet Oral Daily   vancomycin  125 mg Oral QID   Continuous Infusions:  sodium chloride 100 mL/hr at 12/09/22 2117   promethazine (PHENERGAN) injection (IM or IVPB) 12.5 mg (12/09/22 2048)     LOS: 2 days   Burnadette Pop, MD [facility] P4/21/2024, 2:19 PM

## 2022-12-10 NOTE — Evaluation (Signed)
Physical Therapy Evaluation Patient Details Name: [name] MRN: [id number] DOB: August 24, 1945 Today's Date: 12/10/2022  History of Present Illness  77 y.o. 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 Dx of cdiff, possible UTI.  Clinical Impression  Bed level/limited eval only. Pt unwilling to participate. She did allow me to briefly assess L UE, L LE (pt in R sidelying). She would not roll. She did state "mm mm..my stomach is sour." Daughter present and encouraged pt as well-pt continued to decline working with therapy at this time. Daughter reports pt was mobile and required little to no assistance prior to admission. Will continue to follow and progress activity as pt will allow. Unable to make any recommendations at this time.        Recommendations for follow up therapy are one component of a multi-disciplinary discharge planning process, led by the attending physician.  Recommendations may be updated based on patient status, additional functional criteria and insurance authorization.  Follow Up Recommendations       Assistance Recommended at Discharge Intermittent Supervision/Assistance  Patient can return home with the following       Equipment Recommendations None recommended by PT  Recommendations for Other Services       Functional Status Assessment Patient has had a recent decline in their functional status and demonstrates the ability to make significant improvements in function in a reasonable and predictable amount of time.     Precautions / Restrictions Precautions Precautions: Fall Restrictions Weight Bearing Restrictions: No      Mobility  Bed Mobility               General bed mobility comments: NT-pt not willing to participate. would not  even roll over when asked.    Transfers                        Ambulation/Gait                  Stairs            Wheelchair Mobility    Modified Rankin (Stroke Patients Only)       Balance                                             Pertinent Vitals/Pain Pain Assessment Pain Assessment: No/denies pain    Home Living Family/patient expects to be discharged to:: Private residence Living Arrangements: Spouse/significant other Available Help at Discharge: Family;Available PRN/intermittently Type of Home:  (townhome) Home Access: Level entry       Home Layout: One level Home Equipment: Rollator (4 wheels);Shower seat;BSC/3in1;Wheelchair - manual;Grab bars - tub/shower      Prior Function Prior Level of Function : Independent/Modified Independent             Mobility Comments: uses walker ADLs Comments: bathing/dressing MOD I     Hand Dominance        Extremity/Trunk Assessment   Upper Extremity Assessment Upper Extremity Assessment: Defer to OT evaluation    Lower Extremity Assessment Lower Extremity Assessment:  (limited due to pt unwillingness to participate)       Communication   Communication: No difficulties  Cognition   Behavior During Therapy: Flat affect                                   General Comments: pt sleeping. kept eyes closed but responded to questions from daughter, therapist. just not willing to participate        General Comments      Exercises     Assessment/Plan    PT Assessment Patient needs continued PT services  PT Problem List Decreased strength;Decreased balance;Decreased mobility;Decreased activity tolerance;Decreased range of motion;Decreased knowledge of use of DME       PT Treatment Interventions DME instruction;Functional mobility training;Balance training;Patient/family education;Gait training;Therapeutic activities;Therapeutic exercise    PT  Goals (Current goals can be found in the Care Plan section)  Acute Rehab PT Goals Patient Stated Goal: none stated. home per daughter PT Goal Formulation: With patient/family Time For Goal Achievement: 12/24/22 Potential to Achieve Goals: Good    Frequency Min 1X/week     Co-evaluation               AM-PAC PT "6 Clicks" Mobility  Outcome Measure                  End of Session     Patient left: in bed;with call bell/phone within reach;with family/visitor present   PT Visit Diagnosis: Other abnormalities of gait and mobility (R26.89)    Time: 1100-1108 PT Time Calculation (min) (ACUTE ONLY): 8 min   Charges:   PT Evaluation $PT Eval Low Complexity: 1 Low             Faye Ramsay, PT Acute Rehabilitation  Office: [phone]

## 2022-12-11 DIAGNOSIS — R112 Nausea with vomiting, unspecified: Secondary | ICD-10-CM | POA: Diagnosis not present

## 2022-12-11 DIAGNOSIS — A498 Other bacterial infections of unspecified site: Secondary | ICD-10-CM

## 2022-12-11 LAB — BASIC METABOLIC PANEL
Anion gap: 11 (ref 5–15)
BUN: 15 mg/dL (ref 8–23)
CO2: 22 mmol/L (ref 22–32)
Calcium: 9.7 mg/dL (ref 8.9–10.3)
Chloride: 103 mmol/L (ref 98–111)
Creatinine, Ser: 0.76 mg/dL (ref 0.44–1.00)
GFR, Estimated: >60 mL/min (ref >60)
Glucose, Bld: 100 mg/dL — ABNORMAL HIGH (ref 70–99)
Potassium: 3.9 mmol/L (ref 3.5–5.1)
Sodium: 136 mmol/L (ref 135–145)

## 2022-12-11 LAB — CBC
HCT: 49.6 % — ABNORMAL HIGH (ref 36.0–46.0)
Hemoglobin: 16.9 g/dL — ABNORMAL HIGH (ref 12.0–15.0)
MCH: 31.1 pg (ref 26.0–34.0)
MCHC: 34.1 g/dL (ref 30.0–36.0)
MCV: 91.3 fL (ref 80.0–100.0)
Platelets: 357 10*3/uL (ref 150–400)
RBC: 5.43 MIL/uL — ABNORMAL HIGH (ref 3.87–5.11)
RDW: 12.8 % (ref 11.5–15.5)
WBC: 12.9 10*3/uL — ABNORMAL HIGH (ref 4.0–10.5)
nRBC: 0 % (ref 0.0–0.2)

## 2022-12-11 LAB — ZINC: Zinc: 50 ug/dL (ref 44–115)

## 2022-12-11 MED ORDER — SACCHAROMYCES BOULARDII 250 MG PO CAPS
250.0000 mg | ORAL_CAPSULE | Freq: Two times a day (BID) | ORAL | Status: DC
  Administered 2022-12-11 – 2022-12-15 (×8): 250 mg via ORAL
  Filled 2022-12-11 (×10): qty 1

## 2022-12-11 MED ORDER — ONDANSETRON 4 MG PO TBDP
4.0000 mg | ORAL_TABLET | Freq: Once | ORAL | Status: AC | PRN
  Administered 2022-12-11: 4 mg via ORAL
  Filled 2022-12-11: qty 1

## 2022-12-11 MED ORDER — PANTOPRAZOLE SODIUM 40 MG PO TBEC
40.0000 mg | DELAYED_RELEASE_TABLET | Freq: Every day | ORAL | Status: DC
  Administered 2022-12-11 – 2022-12-13 (×3): 40 mg via ORAL
  Filled 2022-12-11 (×3): qty 1

## 2022-12-11 MED ORDER — BANATROL TF EN LIQD
60.0000 mL | Freq: Two times a day (BID) | ENTERAL | Status: DC
  Administered 2022-12-11 – 2022-12-12 (×2): 60 mL via ORAL
  Filled 2022-12-11 (×4): qty 60

## 2022-12-11 NOTE — Progress Notes (Signed)
PT Cancellation Note  Patient Details Name: [name] MRN: [id number] DOB: July 30, 1946   Cancelled Treatment:    Reason Eval/Treat Not Completed: Attempted PT tx session-pt declined to participate. Spoke with family and asked them to try to encourage pt to participate with therapy.    Faye Ramsay, PT Acute Rehabilitation  Office: [phone]

## 2022-12-11 NOTE — Progress Notes (Signed)
PT Cancellation Note  Patient Details Name: [name] MRN: [id number] DOB: [birth year]   Cancelled Treatment:    Reason Eval/Treat Not Completed:  Attempted PT x 2 on today-pt declined to participate despite MAX encouragement from therapist and family. Explained to patient and family that pt has to at least TRY to work with therapy. Will check back one more time to see if pt is willing to participate. If pt continues to refuse, we will sign off.    Faye Ramsay, PT Acute Rehabilitation  Office: [phone]

## 2022-12-11 NOTE — Progress Notes (Signed)
Nutrition Follow-up  DOCUMENTATION CODES:   Non-severe (moderate) malnutrition in context of social or environmental circumstances, Underweight  INTERVENTION:   -Boost Breeze po TID, each supplement provides 250 kcal and 9 grams of protein   -Magic cup TID with meals, each supplement provides 290 kcal and 9 grams of protein  -Will add Banatrol BID fiber supplement d/t diarrhea r/t C.Diff. Each provides 45 kcals, 2 g protein and 5g soluble fiber.  -Multivitamin with minerals daily  -Vitamin labs pending: Vitamin A, Zinc  -Placed "High Calorie, High Protein" handout in AVS   -Will order updated weight  NUTRITION DIAGNOSIS:   Moderate Malnutrition related to social / environmental circumstances (depression) as evidenced by moderate fat depletion, moderate muscle depletion, percent weight loss.  Ongoing.  GOAL:   Patient will meet greater than or equal to 90% of their needs  Progressing.  MONITOR:   PO intake, Supplement acceptance, Labs, Weight trends, I & O's  REASON FOR ASSESSMENT:   Consult Assessment of nutrition requirement/status  ASSESSMENT:   77 year old F with PMH of persistent A-fib on Eliquis, hypothyroidism, HTN, HLD, GERD, depression and persistent UTI symptoms presenting with nausea, vomiting, abdominal pain, poor p.o. intake, weakness and UTI symptoms for about 5 days.  Pt currently alert/oriented x 2. Patient has been eating very little d/t N/V. Was found to be C.Diff positive 4/19. Will add Banatrol fiber supplements to aid diarrhea. Has not been drinking [employer], accepted one later this morning. Has been having nausea, received some Zofran this afternoon.  Folate and Vitamin B-12 labs came back WNL. Vitamin A and Zinc pending.  Admission weight: 115 lbs Will order new weight.  Medications: Remeron, Florastor, Zofran  Labs reviewed: CBGs: 99 C.diff + 4/19   Diet Order:   Diet Order             DIET SOFT Room service appropriate? Yes;  Fluid consistency: Thin  Diet effective now                   EDUCATION NEEDS:   Education needs have been addressed  Skin:  Skin Assessment: Reviewed RN Assessment  Last BM:  4/22 -rectal tube  Height:   Ht Readings from Last 1 Encounters:  12/08/22  (1.702 m)    Weight:   Wt Readings from Last 1 Encounters:  12/08/22 52.4 kg    BMI:  Body mass index is 18.09 kg/m.  Estimated Nutritional Needs:   Kcal:  1600-1800  Protein:  75-90g  Fluid:  1.8L/day  Tilda Franco, MS, RD, LDN Inpatient Clinical Dietitian Contact information available via Amion

## 2022-12-11 NOTE — Progress Notes (Signed)
       Overnight   NAME: SYBEL STANDISH MRN: [id number] DOB : 03-Nov-1945    Date of Service   12/11/2022   HPI/Events of Note   Notified by RN for rapid response.  On arrival to bedside patient was minimally responsive. Narcan given prior to arrival, fentanyl patch has been removed prior.  At approximately 4-minute mark, post administration of Narcan, patient is awake and talking.  Only complaining of being cold. Vitals obtained.   Interventions/ Plan   Narcan as needed ( current order) Hold sedating medications for now Hold Phenergan Continuous pulse oximetry added.      Update:    Second episode requiring as needed Narcan. This was as the earlier episode with rapid recovery post Narcan.  Vitals are maintained throughout.  RN on floor stated pinpoint pupils prior to Narcan and equal response to Narcan (on this 2nd Dose)  Patient will be transferred to SDU for closer monitoring and consideration for further.  2nd Update :  Awake and oriented (as with prior Narcan admin) on arrival to SDU [facility])   Patient alert and talking at 0518 hrs   Chinita Greenland BSN MSNA MSN ACNPC-AG Acute Care Nurse Practitioner [facility]

## 2022-12-11 NOTE — Progress Notes (Signed)
PROGRESS NOTE  [name]  ZHY:[id number] DOB: [birth year] DOA: 12/07/2022 PCP: Chilton Greathouse, MD   Brief Narrative: Patient is a 77 year old female with history of chronic A-fib on Eliquis, hypothyroidism, hypertension, hyperlipidemia, GERD, depression, persistent urinary symptoms who presented with nausea, abdominal pain, vomiting, diarrhea, poor oral intake.  She follows with urology for recurrent UTI  symptoms, urine culture has been persistently negative.  She was on methenamine prophylactically.  On presentation, she was mildly febrile, tachycardic.  He consistent with pyuria.  C. difficile came out to be positive with negative toxin.  Started on oral vancomycin.  Hospital course remarkable for persistent nausea.  GI consulted.  Clinically improving now.  Assessment & Plan:  Principal Problem:   Intractable nausea and vomiting Active Problems:   HTN (hypertension)   Persistent atrial fibrillation   Hypokalemia   Hypothyroidism   Acute metabolic encephalopathy   Pyuria   Hyponatremia   Malnutrition of moderate degree  Intractable nausea/vomiting/decreased oral intake:  Appeared frail on presentation.  CT abdomen/pelvis Without any acute findings.   Dietitian was following.  Currently on Remeron, multivitamin  GI consulted  to rule out esophageal/gastric etiology for nausea, vomiting. Nausea/vomiting better.  Diet advanced to soft.  We have requested dietitian for follow-up today.  Diarrhea/C. difficile: Has been having frequent diarrhea.  No signs of colitis on CT.  Has mild leukocytosis.  C. difficile came out to be positive without toxin.  Since she is symptomatic, will treat.  Started on oral vancomycin.  Leukocytosis improving.  Abdominal pain is better.  Also started on florastor  Suspected UTI: Has chronic urinary symptoms.    Has been tried on different antibiotics in the past.  Also on methenamine at home.  Follows with urology as an outpatient.  Since she presented  with fever and some leukocytosis, started on ceftriaxone.  Urine culture has not shown any growth.  Antibiotics discontinued  Moderate malnutrition: Dietitian was following.  Started on multivitamins, boost breeze.  Abnormal thyroid function test/history of hypothyroidism: TSH is low, free T4 slightly high.  Unclear if she is hyperthyroid state now.  Takes levothyroxine for hypothyroidism which we will hold for now.  We recommend to follow-up with endocrinology as an outpatient.   Hypokalemia: Supplemented and corrected  Leukocytosis: Mild.  Continue to monitor  Acute metabolic encephalopathy: Confusion presentation, resolved.  Was alert and oriented this morning.  Rapid response called on the morning of 4/21 after she became unresponsive.  Thought to be secondary to fentanyl.  Fentanyl patch removed, Narcan given.  Remains alert and oriented again  Persistent A-fib: On Eliquis, bisoprolol.  Hypertension:   Dose of bisoprolol increased, added amlodipine.  Blood pressure better today  Debility/deconditioning: Lives with husband.  Ambulates with the help of walker at home.  Has bilateral knee arthritis, follows with orthopedics.  PT /OT consulted, no follow-up recommended   Nutrition Problem: Moderate Malnutrition Etiology: social / environmental circumstances (depression)    DVT prophylaxis: apixaban (ELIQUIS) tablet 5 mg     Code Status: Full Code  Family Communication: Daughter on phone on 4/21  Patient status:Inpatient  Patient is from :Home  Anticipated discharge EX:[facility]WU  Estimated DC date: 1-2 days   Consultants: GI  Procedures:None  Antimicrobials:  Anti-infectives (From admission, onward)    Start     Dose/Rate Route Frequency Ordered Stop   12/09/22 1200  vancomycin (VANCOCIN) capsule 125 mg        125 mg Oral 4 times daily 12/09/22 0957  12/08/22 1600  cefTRIAXone (ROCEPHIN) 1 g in sodium chloride 0.9 % 100 mL IVPB  Status:  Discontinued        1 g 200  mL/hr over 30 Minutes Intravenous Daily 12/08/22 1456 12/10/22 1229   12/07/22 2245  ciprofloxacin (CIPRO) IVPB 400 mg        400 mg 200 mL/hr over 60 Minutes Intravenous Every 12 hours 12/07/22 2145 12/08/22 1912       Subjective:  Patient seen and examined at bedside today.  Hemodynamically stable.  She says her nausea and vomiting is better today.  Still has rectal tube.  Not sure when the rectal tube bag was in last time.  Her appetite has improved somewhat today.  She wants to eat.  Denies abdomen pain  Objective: Vitals:   12/10/22 1500 12/10/22 2013 12/11/22 0626 12/11/22 1035  BP: (!) 160/99 133/80 (!) 139/98 (!) 141/91  Pulse: 97 96 85 89  Resp: Temp: 98.2 F (36.8 C) 99.3 F (37.4 C) 97.8 F (36.6 C)   TempSrc: Oral  Oral   SpO2: 98% 96% 98%   Weight:      Height:        Intake/Output Summary (Last 24 hours) at 12/11/2022 1225 Last data filed at 12/11/2022 0400 Gross per 24 hour  Intake 3197.73 ml  Output 136 ml  Net 3061.73 ml   Filed Weights   12/08/22 0427  Weight: 52.4 kg    Examination:  General exam: Overall comfortable, not in distress, looks malnourished, weak, deconditioned, thin build HEENT: PERRL Respiratory system:  no wheezes or crackles  Cardiovascular system: S1 & S2 heard, RRR.  Gastrointestinal system: Abdomen is nondistended, soft and nontender. Central nervous system: Alert and oriented Extremities: No edema, no clubbing ,no cyanosis Skin: No rashes, no ulcers,no icterus  GU: Rectal tube   Data Reviewed: I have personally reviewed following labs and imaging studies  CBC: Recent Labs  Lab 12/07/22 1301 12/08/22 0528 12/09/22 0839 12/10/22 1449 12/11/22 0533  WBC 11.3* 13.1* 15.4* 14.1* 12.9*  NEUTROABS 9.4*  --   --   --   --   HGB 15.5* 15.7* 16.4* 16.3* 16.9*  HCT 45.0 46.3* 47.9* 47.3* 49.6*  MCV 89.5 91.1 91.6 90.3 91.3  PLT 362 319 332 354 357   Basic Metabolic Panel: Recent Labs  Lab 12/07/22 1301  12/08/22 0528 12/09/22 0839 12/10/22 1449 12/11/22 0533  NA 132* 134* 134* 133* 136  K 3.3* 2.8* 3.9 2.9* 3.9  CL 97* 100 102 100 103  CO2 24 24 20* 22 22  GLUCOSE 118* 105* 110* 109* 100*  BUN CREATININE 0.90 0.88 0.84 0.65 0.76  CALCIUM 10.3 10.1 10.0 9.6 9.7  MG  --  1.7  --   --   --      Recent Results (from the past 240 hour(s))  Urine Culture     Status: None   Collection Time: 12/05/22 12:26 PM   Specimen: Urine, Clean Catch  Result Value Ref Range Status   Specimen Description   Final    URINE, CLEAN CATCH Performed at [facility], [address] [place], [place] [address]    Special Requests   Final    NONE Performed at [facility], [address] [place], [place] [address]    Culture   Final    NO GROWTH Performed at [facility], [address]  [place], [place] [address]    Report Status 12/06/2022 FINAL  Final  Urine Culture (for pregnant, neutropenic or urologic patients or patients with an indwelling urinary catheter)     Status: None   Collection Time: 12/07/22  2:45 PM   Specimen: Urine, Clean Catch  Result Value Ref Range Status   Specimen Description   Final    URINE, CLEAN CATCH Performed at [facility], [address] [place], [place] [address]    Special Requests   Final    NONE Performed at [facility], [address], [place], [place] [address]    Culture   Final    NO GROWTH Performed at [facility], [address] [place], [place] [address]    Report Status 12/09/2022 FINAL  Final  SARS Coronavirus 2 by RT PCR (hospital order, performed in [facility] hospital lab) *cepheid single result test* Anterior Nasal Swab     Status: None   Collection Time: 12/07/22  6:35 PM   Specimen: Anterior Nasal Swab  Result Value Ref Range Status   SARS Coronavirus 2 by RT PCR NEGATIVE NEGATIVE Final    Comment: (NOTE) SARS-CoV-2 target  nucleic acids are NOT DETECTED.  The SARS-CoV-2 RNA is generally detectable in upper and lower respiratory specimens during the acute phase of infection. The lowest concentration of SARS-CoV-2 viral copies this assay can detect is 250 copies / mL. A negative result does not preclude SARS-CoV-2 infection and should not be used as the sole basis for treatment or other patient management decisions.  A negative result may occur with improper specimen collection / handling, submission of specimen other than nasopharyngeal swab, presence of viral mutation(s) within the areas targeted by this assay, and inadequate number of viral copies (<250 copies / mL). A negative result must be combined with clinical observations, patient history, and epidemiological information.  Fact Sheet for Patients:   RoadLapTop.co.za  Fact Sheet for Healthcare Providers: http://kim-miller.com/  This test is not yet approved or  cleared by the [place] FDA and has been authorized for detection and/or diagnosis of SARS-CoV-2 by FDA under an Emergency Use Authorization (EUA).  This EUA will remain in effect (meaning this test can be used) for the duration of the COVID-19 declaration under Section 564(b)(1) of the Act, 21 U.S.C. section 360bbb-3(b)(1), unless the authorization is terminated or revoked sooner.  Performed at [facility], [address], [place], [place] [address]   C Difficile Quick Screen w PCR reflex     Status: Abnormal   Collection Time: 12/08/22 11:07 AM   Specimen: STOOL  Result Value Ref Range Status   C Diff antigen POSITIVE (A) NEGATIVE Final   C Diff toxin NEGATIVE NEGATIVE Final   C Diff interpretation Results are indeterminate. See PCR results.  Final    Comment: Performed at [facility], [address] [place], [place] [address]  C. Diff by PCR, Reflexed     Status: Abnormal   Collection Time:  12/08/22 11:07 AM  Result Value Ref Range Status   Toxigenic C. Difficile by PCR POSITIVE (A) NEGATIVE Final    Comment: Positive for toxigenic C. difficile with little to no toxin production. Only treat if clinical presentation suggests symptomatic illness. Performed at [facility], [address] [place], [place] [address]      Radiology Studies: No results found.  Scheduled Meds:  amLODipine  10 mg Oral Daily   apixaban  5 mg Oral BID   bisoprolol  10 mg Oral Daily   feeding supplement  1 Container Oral TID BM   mirtazapine  15 mg Oral QHS   multivitamin with minerals  1 tablet Oral Daily   pantoprazole  40 mg Oral Daily   saccharomyces boulardii  250 mg Oral BID   vancomycin  125 mg Oral QID   Continuous Infusions:  sodium chloride 75 mL/hr at 12/10/22 1443   promethazine (PHENERGAN) injection (IM or IVPB) 12.5 mg (12/09/22 2048)     LOS: 3 days   Burnadette Pop, MD [facility] P4/22/2024, 12:25 PM

## 2022-12-11 NOTE — Progress Notes (Addendum)
[facility] Progress Note  CC:  N/V, C. Diff infection   Subjective: No nausea or vomiting this morning. She continues to have mild generalized abdominal pain, not severe. She has tolerated a full liquid diet. She wishes to advance her diet this morning. No CP or SOB. No family at the bedside.    Objective:  Vital signs in last 24 hours: Temp:  [97.8 F (36.6 C)-99.3 F (37.4 C)] 97.8 F (36.6 C) (04/22 0626) Pulse Rate:  [85-111] 85 (04/22 0626) Resp:  [16-20] 16 (04/22 0626) BP: (133-174)/(80-111) 139/98 (04/22 0626) SpO2:  [96 %-99 %] 98 % (04/22 0626) Last BM Date : 12/10/22 General: Alert fatigued appearing 77 year old female in NAD. Heart: RRR, no murmur.  Pulm: Breath  sounds diminished throughout. No wheezes, rhonchi or crackles.  Abdomen: Soft, nondistended. Mild tenderness throughout without rebound or guarding. Flexi seal with 200cc liquid brown stool. Positive bowel sounds x 4 quads.  Extremities:  No edema.  Neurologic:  Alert and  oriented x 4. Speech is clear. Moves all extremities.  Psych:  Alert and cooperative. Normal mood and affect.  Intake/Output from previous day: 04/21 0701 - 04/22 0700 In: 3307.7 [P.O.:230; I.V.:2581; IV Piggyback:496.7] Out: 436 [Stool:436]   Lab Results: Recent Labs    12/09/22 0839 12/10/22 1449 12/11/22 0533  WBC 15.4* 14.1* 12.9*  HGB 16.4* 16.3* 16.9*  HCT 47.9* 47.3* 49.6*  PLT 332 354 357   BMET Recent Labs    12/09/22 0839 12/10/22 1449 12/11/22 0533  NA 134* 133* 136  K 3.9 2.9* 3.9  CL 102 100 103  CO2 20* 22 22  GLUCOSE 110* 109* 100*  BUN CREATININE 0.84 0.65 0.76  CALCIUM 10.0 9.6 9.7   LFT No results for input(s): "PROT", "ALBUMIN", "AST", "ALT", "ALKPHOS", "BILITOT", "BILIDIR", "IBILI" in the last 72 hours. PT/INR No results for input(s): "LABPROT", "INR" in the last 72 hours. Hepatitis Panel No results for input(s): "HEPBSAG", "HCVAB", "HEPAIGM", "HEPBIGM" in the  last 72 hours.  No results found.  Assessment / Plan:  77 year old female admitted to the hospital 12/07/2022 with altered mental status, N/V/D and lower abdominal pain x 1 week. Admission WBC 11.3. T. Bili 2.0 on hemolyzed specimen. Normal Alk phos/AST/ALT and lipase levels. C. Diff antigen positive, C. Diff toxin negative and Toxigenic C. Diff PCR positive. On oral Vancomycin  po qid. Nausea less this morning. Mild generalized abdominal pain. Flexi seal with 200cc watery brown stool.  -Continue oral Vancomycin qid x 14 days -Florastor probiotic one po bid -Ondansetron 4 mg po or IV Q 6 hours PRN -Pain management per the hospitalist s -Soft diet. RN to administer Ondansetron 30 minutes prior to breakfast. -CBC, CMP in am -Await further recommendations per Dr. Leone Payor   History of GERD/PUD/gastric adenoma, dysphagia and chronic nausea. EGD/2021 showed torturous esophagus, small hiatal hernia with partial Schatzki's ring and gastritis without evidence of H. pylori or dysplasia.  Barium swallow study 10/2021 showed significant esophageal dysmotility related to aging without evidence of a stricture. Previously seen at [facility] regarding chronic nausea vomiting. Takes Ondansetron once weekly at home.  -Pantoprazole  po QD -Outpatient GI follow up  Acute encephalopathy secondary to acute illness/dehydration, resolved   Atrial fibrillation on Eliquis and Bisoprolol   Chronic UTIs. UA with leukocytes and protein. Urine culture negative.   Leukocytosis, likely due to C. Diff and chronic UTIs  Polycythemia. Hg 16.9.   Hypothyroidism with low  TSH level 0.159, elevated T4 level 1.26 -Management per the hospitalist   Chronic pancreatic duct dilatation unchanged, pancreatic divisum without acute pancreatitis, prior cholecystectomy with expected postsurgical dilatation of the common bile duct per CTAP 12/07/2022. Normal lipase level.      LOS: 3 days   Arnaldo Natal   12/11/2022, 8:45AM     [facility] Attending   I have taken an interval history, reviewed the chart and examined the patient. I agree with the Advanced Practitioner's note, impression and recommendations.    This lady has a chronic vomiting problem + acute C diff.  Would treat the C diff as you are and use ondansetron for vomiting. It is quite possible she may vomit some more given that is a chronic issue. She is improved today w/ respect to that.  I am signing off but available if needed. I do not think she needs a GI f/u visit after dc - she can see PCp and they can decide if GI f/u needed.  Iva Boop, MD, [facility] See Loretha Stapler on call - gastroenterology for best contact person 12/11/2022 3:56 PM

## 2022-12-11 NOTE — Plan of Care (Signed)

## 2022-12-12 ENCOUNTER — Inpatient Hospital Stay (HOSPITAL_COMMUNITY): Payer: Medicare HMO

## 2022-12-12 DIAGNOSIS — R112 Nausea with vomiting, unspecified: Secondary | ICD-10-CM | POA: Diagnosis not present

## 2022-12-12 LAB — CBC
HCT: 46.2 % — ABNORMAL HIGH (ref 36.0–46.0)
Hemoglobin: 15.6 g/dL — ABNORMAL HIGH (ref 12.0–15.0)
MCH: 31.5 pg (ref 26.0–34.0)
MCHC: 33.8 g/dL (ref 30.0–36.0)
MCV: 93.3 fL (ref 80.0–100.0)
Platelets: 326 10*3/uL (ref 150–400)
RBC: 4.95 MIL/uL (ref 3.87–5.11)
RDW: 12.6 % (ref 11.5–15.5)
WBC: 12.1 10*3/uL — ABNORMAL HIGH (ref 4.0–10.5)
nRBC: 0 % (ref 0.0–0.2)

## 2022-12-12 LAB — BLOOD GAS, ARTERIAL
Acid-Base Excess: 0.4 mmol/L (ref 0.0–2.0)
Bicarbonate: 23.1 mmol/L (ref 20.0–28.0)
Drawn by: 331471
O2 Saturation: 100 %
Patient temperature: 37
pCO2 arterial: 31 mmHg — ABNORMAL LOW (ref 32–48)
pH, Arterial: 7.48 — ABNORMAL HIGH (ref 7.35–7.45)
pO2, Arterial: 103 mmHg (ref 83–108)

## 2022-12-12 LAB — BASIC METABOLIC PANEL
Anion gap: 11 (ref 5–15)
BUN: 15 mg/dL (ref 8–23)
CO2: 24 mmol/L (ref 22–32)
Calcium: 9.3 mg/dL (ref 8.9–10.3)
Chloride: 100 mmol/L (ref 98–111)
Creatinine, Ser: 0.92 mg/dL (ref 0.44–1.00)
GFR, Estimated: >60 mL/min (ref >60)
Glucose, Bld: 121 mg/dL — ABNORMAL HIGH (ref 70–99)
Potassium: 3.4 mmol/L — ABNORMAL LOW (ref 3.5–5.1)
Sodium: 135 mmol/L (ref 135–145)

## 2022-12-12 LAB — GLUCOSE, CAPILLARY
Glucose-Capillary: 124 mg/dL — ABNORMAL HIGH (ref 70–99)
Glucose-Capillary: 116 mg/dL — ABNORMAL HIGH (ref 70–99)

## 2022-12-12 LAB — AMMONIA: Ammonia: 26 umol/L (ref 9–35)

## 2022-12-12 LAB — MRSA NEXT GEN BY PCR, NASAL: MRSA by PCR Next Gen: NOT DETECTED

## 2022-12-12 MED ORDER — NALOXONE HCL 4 MG/10ML IJ SOLN
0.5000 mg/h | INTRAVENOUS | Status: DC
  Administered 2022-12-12: 0.25 mg/h via INTRAVENOUS
  Administered 2022-12-12 – 2022-12-13 (×3): 1 mg/h via INTRAVENOUS
  Administered 2022-12-13: 0.5 mg/h via INTRAVENOUS
  Filled 2022-12-12 (×6): qty 10

## 2022-12-12 MED ORDER — POTASSIUM CHLORIDE 20 MEQ PO PACK: 40.0000 meq | PACK | Freq: Once | ORAL | Status: DC

## 2022-12-12 MED ORDER — METRONIDAZOLE 500 MG/100ML IV SOLN
500.0000 mg | Freq: Three times a day (TID) | INTRAVENOUS | Status: AC
  Administered 2022-12-12 – 2022-12-13 (×2): 500 mg via INTRAVENOUS
  Filled 2022-12-12 (×2): qty 100

## 2022-12-12 MED ORDER — BUPROPION HCL 75 MG PO TABS
75.0000 mg | ORAL_TABLET | Freq: Every morning | ORAL | Status: DC
  Administered 2022-12-13 – 2022-12-15 (×3): 75 mg via ORAL
  Filled 2022-12-12 (×3): qty 1

## 2022-12-12 MED ORDER — INSULIN ASPART 100 UNIT/ML IJ SOLN
0.0000 [IU] | INTRAMUSCULAR | Status: DC
  Administered 2022-12-13 (×2): 1 [IU] via SUBCUTANEOUS
  Administered 2022-12-14: 2 [IU] via SUBCUTANEOUS
  Administered 2022-12-15: 1 [IU] via SUBCUTANEOUS

## 2022-12-12 MED ORDER — ACETAMINOPHEN 325 MG PO TABS
ORAL_TABLET | ORAL | Status: AC
  Administered 2022-12-12: 325 mg
  Filled 2022-12-12: qty 1

## 2022-12-12 MED ORDER — ACETAMINOPHEN 325 MG PO TABS
650.0000 mg | ORAL_TABLET | Freq: Once | ORAL | Status: AC
  Administered 2022-12-12: 650 mg via ORAL

## 2022-12-12 MED ORDER — FLUOXETINE HCL 20 MG PO CAPS
20.0000 mg | ORAL_CAPSULE | Freq: Every day | ORAL | Status: DC
  Administered 2022-12-13 – 2022-12-14 (×2): 20 mg via ORAL
  Filled 2022-12-12 (×3): qty 1

## 2022-12-12 MED ORDER — BISOPROLOL FUMARATE 5 MG PO TABS
5.0000 mg | ORAL_TABLET | Freq: Every day | ORAL | Status: DC
  Administered 2022-12-13 – 2022-12-15 (×3): 5 mg via ORAL
  Filled 2022-12-12 (×3): qty 1

## 2022-12-12 MED ORDER — CHLORHEXIDINE GLUCONATE CLOTH 2 % EX PADS
6.0000 | MEDICATED_PAD | Freq: Every day | CUTANEOUS | Status: DC
  Administered 2022-12-12 – 2022-12-15 (×4): 6 via TOPICAL

## 2022-12-12 NOTE — Progress Notes (Signed)
During shift change report, patient noticed to be very lethargic, arouses to painful stimuli, pupils are reactive, however, much small in diameter compared to previous assessment by night RN. Patient is mumbling, not answering questions, not opening eyes, not following commands. Vital signs are stable, HR 77, BP 128/84(99), o2 sats - 97% on 2L [place], Resp 16-20. Skin was diaphoretic.  Daughter, at bedside attempted to arouse patient as well - she compares this episode to the previous episodes she has experienced this hospitalization.   Patient required x2 doses of 0.4mg  Narcan. After about 5 minutes, patient was able to answer orientation questions, follow commands. Express her physical needs. Skin rechecked by 2 RN - did not find any fentanyl patches remaining.  Dr. Renford Dills paged and presented promptly to bedside. See new orders. Patient and daughter updated on plan of care.

## 2022-12-12 NOTE — Progress Notes (Signed)
PROGRESS NOTE  [name]  BJY:[id number] DOB: 01-03-1946 DOA: 12/07/2022 PCP: Chilton Greathouse, MD   Brief Narrative: Patient is a 77 year old female with history of chronic A-fib on Eliquis, hypothyroidism, hypertension, hyperlipidemia, GERD, depression, persistent urinary symptoms who presented with nausea, abdominal pain, vomiting, diarrhea, poor oral intake.  She follows with urology for recurrent UTI  symptoms, urine culture has been persistently negative.  She was on methenamine prophylactically.  On presentation, she was mildly febrile, tachycardic.  He consistent with pyuria.  C. difficile came out to be positive with negative toxin.  Started on oral vancomycin.  Hospital course remarkable for persistent nausea/vomiting, frequently becoming unresponsive symptoms most likely secondary to effect of narcotics.  GI was consulted and following ,now signed off.  Assessment & Plan:  Principal Problem:   Intractable nausea and vomiting Active Problems:   HTN (hypertension)   Persistent atrial fibrillation   Hypokalemia   Hypothyroidism   Acute metabolic encephalopathy   Pyuria   Hyponatremia   Malnutrition of moderate degree   Clostridioides difficile infection  Intractable nausea/vomiting/decreased oral intake:  Appeared frail on presentation.  CT abdomen/pelvis Without any acute findings.   Dietitian has been  following.  Currently on multivitamin  GI consulted  to rule out esophageal/gastric etiology for nausea, vomiting. Nausea/vomiting seems to be better.  Diet advanced to soft.  GI signed of  Diarrhea/C. difficile: Had been having frequent diarrhea.  No signs of colitis on CT.  Has mild leukocytosis.  C. difficile came out to be positive without toxin.  Since she was symptomatic,Started on oral vancomycin.  Leukocytosis improving.  Abdominal pain is better.  Also started on florastor.  Diarrhea has been slowed down.  Suspected UTI: Has chronic urinary symptoms.    Has been  tried on different antibiotics in the past.  Also on methenamine at home.  Follows with urology as an outpatient.  Since she presented with fever and some leukocytosis, started on ceftriaxone.  Urine culture has not shown any growth.  Antibiotics discontinued  Moderate malnutrition: Dietitian was following.  Started on multivitamins, boost breeze.  Abnormal thyroid function test/history of hypothyroidism: TSH is low, free T4 slightly high.  Unclear if she is hyperthyroid state now.  Takes levothyroxine for hypothyroidism which we will hold for now.  We recommend to follow-up with endocrinology as an outpatient.   Hypokalemia: Supplemented and corrected  Leukocytosis: Mild.  Continue to monitor  Acute metabolic encephalopathy:   Rapid response called on the morning of 4/21 after she became unresponsive.  Thought to be secondary to fentanyl.  Fentanyl patch removed, Narcan given.  Remained alert and oriented but again had the same problem last night and this morning.  Currently in stepdown. This is likely secondary to effect of narcotics.  She should be off narcotics absolutely.  Rameron, Phenergan also discontinued. CT head did not show any acute findings, ammonia level/ ABG unremarkable  Persistent A-fib: On Eliquis, bisoprolol.  Hypertension:   Dose of bisoprolol increased, added amlodipine.  Blood pressure better today  Debility/deconditioning: Lives with husband.  Ambulates with the help of walker at home.  Has bilateral knee arthritis, follows with orthopedics.  PT /OT consulted, no follow-up recommended   Nutrition Problem: Moderate Malnutrition Etiology: social / environmental circumstances (depression)    DVT prophylaxis: apixaban (ELIQUIS) tablet 5 mg     Code Status: Full Code  Family Communication: Daughter at bedside on 4/23  Patient status:Inpatient  Patient is from :Home  Anticipated discharge YQ:MVHQ  Estimated  DC date: 1-2 days   Consultants:  GI  Procedures:None  Antimicrobials:  Anti-infectives (From admission, onward)    Start     Dose/Rate Route Frequency Ordered Stop   12/09/22 1200  vancomycin (VANCOCIN) capsule 125 mg        125 mg Oral 4 times daily 12/09/22 0957 12/18/22 2359   12/08/22 1600  cefTRIAXone (ROCEPHIN) 1 g in sodium chloride 0.9 % 100 mL IVPB  Status:  Discontinued        1 g 200 mL/hr over 30 Minutes Intravenous Daily 12/08/22 1456 12/10/22 1229   12/07/22 2245  ciprofloxacin (CIPRO) IVPB 400 mg        400 mg 200 mL/hr over 60 Minutes Intravenous Every 12 hours 12/07/22 2145 12/08/22 1912       Subjective:  Patient seen and examined today.  She had been moved to stepdown yesterday because she was unresponsive and was given Narcan after that she regained her consciousness and was alert oriented.  Same thing happened this morning.  Given Narcan again and she is back to her baseline.  Hemodynamically stable  Objective: Vitals:   12/12/22 0700 12/12/22 0741 12/12/22 0800 12/12/22 0945  BP: 103/64 128/84  123/86  Pulse: 67 77 96 94  Resp: (!) 22 19 (!) 22 19  Temp:   98.3 F (36.8 C)   TempSrc:   Oral   SpO2: 99% 97% 97% 100%  Weight:      Height:        Intake/Output Summary (Last 24 hours) at 12/12/2022 1035 Last data filed at 12/12/2022 0300 Gross per 24 hour  Intake 419 ml  Output --  Net 419 ml   Filed Weights   12/08/22 0427 12/12/22 0400  Weight: 52.4 kg 52.3 kg    Examination:  General exam: Sleepy/drowsy, looks very weak and deconditioned HEENT: PERRL Respiratory system:  no wheezes or crackles  Cardiovascular system: Irregularly irregular rhythm.  Gastrointestinal system: Abdomen is nondistended, soft and nontender. Central nervous system: Sleepy/drowsy  extremities: No edema, no clubbing ,no cyanosis Skin: No rashes, no ulcers,no icterus     Data Reviewed: I have personally reviewed following labs and imaging studies  CBC: Recent Labs  Lab 12/07/22 1301  12/08/22 0528 12/09/22 0839 12/10/22 1449 12/11/22 0533 12/12/22 0520  WBC 11.3* 13.1* 15.4* 14.1* 12.9* 12.1*  NEUTROABS 9.4*  --   --   --   --   --   HGB 15.5* 15.7* 16.4* 16.3* 16.9* 15.6*  HCT 45.0 46.3* 47.9* 47.3* 49.6* 46.2*  MCV 89.5 91.1 91.6 90.3 91.3 93.3  PLT 362 319 332 354 357 326   Basic Metabolic Panel: Recent Labs  Lab 12/08/22 0528 12/09/22 0839 12/10/22 1449 12/11/22 0533 12/12/22 0520  NA 134* 134* 133* 136 135  K 2.8* 3.9 2.9* 3.9 3.4*  CL 100 102 100 103 100  CO2 24 20* GLUCOSE 105* 110* 109* 100* 121*  BUN CREATININE 0.88 0.84 0.65 0.76 0.92  CALCIUM 10.1 10.0 9.6 9.7 9.3  MG 1.7  --   --   --   --      Recent Results (from the past 240 hour(s))  Urine Culture     Status: None   Collection Time: 12/05/22 12:26 PM   Specimen: Urine, Clean Catch  Result Value Ref Range Status   Specimen Description   Final    URINE, CLEAN CATCH Performed at [facility], [address] [place], [place] [address]    Special Requests   Final    NONE Performed at [facility], [address], [place], [place] [address]    Culture   Final    NO GROWTH Performed at [facility], [address], [place], [place] [address]    Report Status 12/06/2022 FINAL  Final  Urine Culture (for pregnant, neutropenic or urologic patients or patients with an indwelling urinary catheter)     Status: None   Collection Time: 12/07/22  2:45 PM   Specimen: Urine, Clean Catch  Result Value Ref Range Status   Specimen Description   Final    URINE, CLEAN CATCH Performed at [facility], [address] [place], [place] [address]    Special Requests   Final    NONE Performed at [facility], [address], [place], [place] [address]    Culture   Final    NO GROWTH Performed at [facility], [address], [place], [place] [address]    Report Status 12/09/2022  FINAL  Final  SARS Coronavirus 2 by RT PCR (hospital order, performed in [facility] hospital lab) *cepheid single result test* Anterior Nasal Swab     Status: None   Collection Time: 12/07/22  6:35 PM   Specimen: Anterior Nasal Swab  Result Value Ref Range Status   SARS Coronavirus 2 by RT PCR NEGATIVE NEGATIVE Final    Comment: (NOTE) SARS-CoV-2 target nucleic acids are NOT DETECTED.  The SARS-CoV-2 RNA is generally detectable in upper and lower respiratory specimens during the acute phase of infection. The lowest concentration of SARS-CoV-2 viral copies this assay can detect is 250 copies / mL. A negative result does not preclude SARS-CoV-2 infection and should not be used as the sole basis for treatment or other patient management decisions.  A negative result may occur with improper specimen collection / handling, submission of specimen other than nasopharyngeal swab, presence of viral mutation(s) within the areas targeted by this assay, and inadequate number of viral copies (<250 copies / mL). A negative result must be combined with clinical observations, patient history, and epidemiological information.  Fact Sheet for Patients:   RoadLapTop.co.za  Fact Sheet for Healthcare Providers: http://kim-miller.com/  This test is not yet approved or  cleared by the [place] FDA and has been authorized for detection and/or diagnosis of SARS-CoV-2 by FDA under an Emergency Use Authorization (EUA).  This EUA will remain in effect (meaning this test can be used) for the duration of the COVID-19 declaration under Section 564(b)(1) of the Act, 21 U.S.C. section 360bbb-3(b)(1), unless the authorization is terminated or revoked sooner.  Performed at [facility], [address] [place], [place] [address]   C Difficile Quick Screen w PCR reflex     Status: Abnormal   Collection Time: 12/08/22 11:07 AM   Specimen: STOOL   Result Value Ref Range Status   C Diff antigen POSITIVE (A) NEGATIVE Final   C Diff toxin NEGATIVE NEGATIVE Final   C Diff interpretation Results are indeterminate. See PCR results.  Final    Comment: Performed at [facility], [address] [place], [place] [address]  C. Diff by PCR, Reflexed     Status: Abnormal   Collection Time: 12/08/22 11:07 AM  Result Value Ref Range Status   Toxigenic C. Difficile by PCR POSITIVE (A) NEGATIVE Final    Comment: Positive for toxigenic  C. difficile with little to no toxin production. Only treat if clinical presentation suggests symptomatic illness. Performed at [facility], [address] [place], [place] [address]      Radiology Studies: CT HEAD WO CONTRAST ( )  Result Date: 12/12/2022 CLINICAL DATA:  Provided history: Mental status change, unknown cause. Nausea. Vomiting. EXAM: CT HEAD WITHOUT CONTRAST TECHNIQUE: Contiguous axial images were obtained from the base of the skull through the vertex without intravenous contrast. RADIATION DOSE REDUCTION: This exam was performed according to the departmental dose-optimization program which includes automated exposure control, adjustment of the mA and/or kV according to patient size and/or use of iterative reconstruction technique. COMPARISON:  Head CT 06/17/2022.  Brain MRI 05/27/2020. FINDINGS: Brain: Mild generalized parenchymal atrophy. 6 mm dural-based calcified focus overlying the mid to posterior right frontal lobe, which may reflect a dural calcification or small calcified meningioma (series 4, image 32). This finding is unchanged from the prior head CT of 06/17/2022. Patchy and ill-defined hypoattenuation within the cerebral white matter, nonspecific but compatible with mild chronic small vessel ischemic disease. There is no acute intracranial hemorrhage. No demarcated cortical infarct. No extra-axial fluid collection. No midline shift. Vascular: No hyperdense vessel.   Atherosclerotic calcifications. Skull: No fracture or aggressive osseous lesion. Sinuses/Orbits: No mass or acute finding within the imaged orbits. Small-volume frothy secretions within a right ethmoid air cell. Trace mucosal thickening scattered elsewhere within the bilateral ethmoid sinuses. IMPRESSION: 1. No evidence of acute intracranial hemorrhage or acute infarct. 2. 8 mm dural-based calcified focus overlying the mid-to-posterior right frontal lobe. This is unchanged from the prior head CT of 06/17/2022 and may reflect a dural calcification or small calcified meningioma. No significant mass effect upon the underlying brain parenchyma. 3. Mild chronic small vessel ischemic changes within the cerebral white matter. 4. Mild generalized parenchymal atrophy. 5. Mild ethmoid sinus disease, as described. Electronically Signed   By: Jackey Loge D.O.   On: 12/12/2022 09:54    Scheduled Meds:  amLODipine  10 mg Oral Daily   apixaban  5 mg Oral BID   bisoprolol  10 mg Oral Daily   Chlorhexidine Gluconate Cloth  6 each Topical Daily   feeding supplement  1 Container Oral TID BM   fiber supplement (BANATROL TF)  60 mL Oral BID   multivitamin with minerals  1 tablet Oral Daily   pantoprazole  40 mg Oral Daily   saccharomyces boulardii  250 mg Oral BID   vancomycin  125 mg Oral QID   Continuous Infusions:  sodium chloride 75 mL/hr at 12/12/22 1031     LOS: 4 days   Burnadette Pop, MD [facility] P4/23/2024, 10:35 AM

## 2022-12-12 NOTE — Progress Notes (Signed)
Leaving with [facility] w/ Narcan gtt and NS fluids.

## 2022-12-12 NOTE — Progress Notes (Signed)
Rapid EEG placed on patient.

## 2022-12-12 NOTE — Progress Notes (Signed)
Patient non- arousable around 2225 when RN went in the room. Rapid Response nurse called. On- call provider made aware. IV Narcan administered. Similar episode around 0220, patient minimally arousable, had to give narcan again. Patient woke up after 6 minutes and was able to tell me her name and D.O.B. Asked assistance for urination. Bedpan provided.  New orders for transferring patient to [facility] received. Daughter at bedside made aware.

## 2022-12-12 NOTE — Significant Event (Signed)
Rapid Response Event Note   Reason for Call :  Decreased LOC  Initial Focused Assessment:  Vitals Stable, patient minimally arousable on assessment. Primary nurse administered as needed dose of narcan 0.4 ordered in [facility]. NP paged to bedside. After sternal rub patient woke up able to tell me all orientation questions, asking to use the bathroom.    See flowsheet for documented vitals.  Interventions:  Narcan 0.4 Continuous O2 sat monitoring  Plan of Care:  If patient worsens then transfer to SD for observation. Hold all sedating medications.  Event Summary:   MD Notified: Chinita Greenland, NP Call Time: 2225 Arrival Time: 2226 End Time: 2247  Odette Horns, RN

## 2022-12-12 NOTE — Progress Notes (Signed)
PT Cancellation Note  Patient Details Name: [name] MRN: [id number] DOB: 1946-02-09   Cancelled Treatment:    Reason Eval/Treat Not Completed: Patient declined, stated she was tired then not reason. Blanchard Kelch PT Acute Rehabilitation Services Office [phone] Weekend [phone]    Rada Hay 12/12/2022, 12:01 PM

## 2022-12-12 NOTE — Progress Notes (Signed)
Patient difficult to arouse, will briefly respond to voice or noxious stimulus but immediately fall asleep. Prn dose of nalaxone IV given but no improvement of LOC. Patient unable to take nightly po medications. Resp 21, O2 sat 98, and patient having no signs of respiratory distress. Dr. Delia Chimes notified. Increasing nalaxone gtt to 1 mg/hr per order.
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
## 2022-12-12 NOTE — Progress Notes (Signed)
Family alerted staff to come to bedside, patient has become less responsive. Upon assessment, patient has eyes closed and is mumbling. Attempted verbal stimuli - unsuccessful. She arouses to painful stimuli - says "stop it, I'm cold", then falls back asleep. Patient is very diaphoretic - CBG is 124. IV narcan infusing, IV appropriate and functional. Daughter at bedside states that patient complained of being very hot and wanted covers off, prior to her decreased level of consciousness.    Dr. Renford Dills paged to come to the bedside. Critical care consulted

## 2022-12-12 NOTE — Progress Notes (Signed)
OT Cancellation Note  Patient Details Name: [name] MRN: [id number] DOB: [birth year]   Cancelled Treatment:    Reason Eval/Treat Not Completed: Patient declined, no reason specified Patient declined to participate in session. Husband in room. OT to continue to follow and check back as schedule will allow Rosalio Loud, MS Acute Rehabilitation Department Office# [phone]  12/12/2022, 11:48 AM

## 2022-12-12 NOTE — Progress Notes (Signed)
eLink Physician-Brief Progress Note Patient Name: CAMI DELAWDER DOB: 1945-09-27 MRN: [id number]   Date of Service  12/12/2022  HPI/Events of Note  76yo with a history of afib on chronic eliquis, hypothyroidism, hypertension, GERD and depression who was initially admitted on 4/18 with nausea, vomiting found to have C. difficile positivity.  On 4/23, critical care was consulted for encephalopathy in the setting of suspected fentanyl overdose with a fentanyl patch in place.  She was transferred from [facility] for further management on a Narcan drip.  Patient is no longer tachypneic, respiratory rate in 18-23 range.  Vital signs otherwise stable.  97% on room air.  Receiving Narcan drip at 0.5 mg/h.  RASS of 0.  Blood gas within normal limits aside from mild alkalemia.  Morning labs consistent with resolving leukocytosis, hemoconcentration  eICU Interventions  Patient was more arousable earlier but now having a hard time coordinating taking oral meds.  Will increase continuous naloxone dose to 1 mg/h.  Temporarily, add on IV Flagyl for 2 additional doses if the patient is not able to tolerate orals.   6295 - K 3.0, Mg 1.7, Cr 0.92; magnesium sulfate and KCl ordered.  Intervention Category Evaluation Type: New Patient Evaluation  Dany Walther 12/12/2022, 10:46 PM

## 2022-12-12 NOTE — Progress Notes (Signed)
Report called to Joey RN on 4[facility] ICU, awaiting carelink pickup

## 2022-12-12 NOTE — Progress Notes (Signed)
Patient assessed for her 2 hour neuro check at 1300. She had her eyes closed, did not respond to verbal stimulus. Light touch, she did moan and grumble. Pupils 2mm and sluggish, gown, pillow case and bed sheets were saturated with sweat.  Patient given 0.4mg  of narcan, x3 doses. Not alert and oriented s/p 3rd dose. Provider - Renford Dills was notified. He arrived at bedside, ordered EEG and narcan drip.   Narcan drip started - after about 5 mins, patient was back to baseline, asking for water.

## 2022-12-12 NOTE — Progress Notes (Signed)
Patient had an episode of unresponsiveness again this afternoon. Patient seen and examined at bedside. She wakes up intermittently and speaks,knows that she is hospital.When she wakes up,she doesnot have any focal weakness,speech is clear. Pupils remain small.We will continue Narcan drip for now. I discussed the case with Dr Judeth Horn and critical care team will take over ad be the primary team.Dr Hunsucker will decide if she needs neurology consult.At the mean time,I have also ordered EEG.  CT head done this mrng did not show any acute findings,ammonia level normal.ABG does show hypercarbia. Vitals stable

## 2022-12-13 ENCOUNTER — Inpatient Hospital Stay (HOSPITAL_COMMUNITY): Payer: Medicare HMO

## 2022-12-13 DIAGNOSIS — R569 Unspecified convulsions: Secondary | ICD-10-CM | POA: Diagnosis not present

## 2022-12-13 DIAGNOSIS — R4182 Altered mental status, unspecified: Secondary | ICD-10-CM

## 2022-12-13 DIAGNOSIS — R112 Nausea with vomiting, unspecified: Secondary | ICD-10-CM | POA: Diagnosis not present

## 2022-12-13 LAB — CBC WITH DIFFERENTIAL/PLATELET
Abs Immature Granulocytes: 0.12 10*3/uL — ABNORMAL HIGH (ref 0.00–0.07)
Basophils Absolute: 0 10*3/uL (ref 0.0–0.1)
Basophils Relative: 0 %
Eosinophils Absolute: 0.2 10*3/uL (ref 0.0–0.5)
Eosinophils Relative: 2 %
HCT: 46.4 % — ABNORMAL HIGH (ref 36.0–46.0)
Hemoglobin: 15.8 g/dL — ABNORMAL HIGH (ref 12.0–15.0)
Immature Granulocytes: 1 %
Lymphocytes Relative: 21 %
Lymphs Abs: 2.1 10*3/uL (ref 0.7–4.0)
MCH: 31.2 pg (ref 26.0–34.0)
MCHC: 34.1 g/dL (ref 30.0–36.0)
MCV: 91.5 fL (ref 80.0–100.0)
Monocytes Absolute: 0.8 10*3/uL (ref 0.1–1.0)
Monocytes Relative: 8 %
Neutro Abs: 6.6 10*3/uL (ref 1.7–7.7)
Neutrophils Relative %: 68 %
Platelets: 310 10*3/uL (ref 150–400)
RBC: 5.07 MIL/uL (ref 3.87–5.11)
RDW: 12.7 % (ref 11.5–15.5)
WBC: 9.9 10*3/uL (ref 4.0–10.5)
nRBC: 0 % (ref 0.0–0.2)

## 2022-12-13 LAB — VITAMIN A: Vitamin A (Retinoic Acid): 35 ug/dL (ref 22.0–69.5)

## 2022-12-13 LAB — COMPREHENSIVE METABOLIC PANEL
ALT: 15 U/L (ref 0–44)
AST: 16 U/L (ref 15–41)
Albumin: 2.9 g/dL — ABNORMAL LOW (ref 3.5–5.0)
Alkaline Phosphatase: 52 U/L (ref 38–126)
Anion gap: 6 (ref 5–15)
BUN: 8 mg/dL (ref 8–23)
CO2: 26 mmol/L (ref 22–32)
Calcium: 9 mg/dL (ref 8.9–10.3)
Chloride: 103 mmol/L (ref 98–111)
Creatinine, Ser: 0.92 mg/dL (ref 0.44–1.00)
GFR, Estimated: >60 mL/min (ref >60)
Glucose, Bld: 118 mg/dL — ABNORMAL HIGH (ref 70–99)
Potassium: 3 mmol/L — ABNORMAL LOW (ref 3.5–5.1)
Sodium: 135 mmol/L (ref 135–145)
Total Bilirubin: 1.2 mg/dL (ref 0.3–1.2)
Total Protein: 5.1 g/dL — ABNORMAL LOW (ref 6.5–8.1)

## 2022-12-13 LAB — MAGNESIUM: Magnesium: 1.7 mg/dL (ref 1.7–2.4)

## 2022-12-13 LAB — GLUCOSE, CAPILLARY
Glucose-Capillary: 109 mg/dL — ABNORMAL HIGH (ref 70–99)
Glucose-Capillary: 116 mg/dL — ABNORMAL HIGH (ref 70–99)
Glucose-Capillary: 117 mg/dL — ABNORMAL HIGH (ref 70–99)
Glucose-Capillary: 125 mg/dL — ABNORMAL HIGH (ref 70–99)
Glucose-Capillary: 140 mg/dL — ABNORMAL HIGH (ref 70–99)
Glucose-Capillary: 91 mg/dL (ref 70–99)

## 2022-12-13 LAB — TSH: TSH: 1.228 u[IU]/mL (ref 0.350–4.500)

## 2022-12-13 LAB — T4, FREE: Free T4: 1.02 ng/dL (ref 0.61–1.12)

## 2022-12-13 MED ORDER — THIAMINE HCL 100 MG/ML IJ SOLN
500.0000 mg | Freq: Three times a day (TID) | INTRAVENOUS | Status: DC
  Administered 2022-12-13 – 2022-12-15 (×6): 500 mg via INTRAVENOUS
  Filled 2022-12-13 (×7): qty 5

## 2022-12-13 MED ORDER — THIAMINE HCL 100 MG/ML IJ SOLN
500.0000 mg | Freq: Every day | INTRAVENOUS | Status: DC
  Administered 2022-12-13: 500 mg via INTRAVENOUS
  Filled 2022-12-13: qty 5

## 2022-12-13 MED ORDER — POTASSIUM CHLORIDE 10 MEQ/100ML IV SOLN
10.0000 meq | INTRAVENOUS | Status: AC
  Administered 2022-12-13 (×4): 10 meq via INTRAVENOUS
  Filled 2022-12-13 (×4): qty 100

## 2022-12-13 MED ORDER — ACETAMINOPHEN 325 MG PO TABS
650.0000 mg | ORAL_TABLET | Freq: Four times a day (QID) | ORAL | Status: DC | PRN
  Administered 2022-12-13 – 2022-12-14 (×3): 650 mg via ORAL
  Filled 2022-12-13 (×3): qty 2

## 2022-12-13 MED ORDER — POTASSIUM CHLORIDE CRYS ER 20 MEQ PO TBCR
40.0000 meq | EXTENDED_RELEASE_TABLET | Freq: Three times a day (TID) | ORAL | Status: DC
  Administered 2022-12-13: 40 meq via ORAL
  Filled 2022-12-13 (×2): qty 2

## 2022-12-13 MED ORDER — MAGNESIUM SULFATE 4 GM/100ML IV SOLN
4.0000 g | Freq: Once | INTRAVENOUS | Status: AC
  Administered 2022-12-13: 4 g via INTRAVENOUS
  Filled 2022-12-13: qty 100

## 2022-12-13 MED ORDER — POTASSIUM CHLORIDE 20 MEQ PO PACK
60.0000 meq | PACK | Freq: Three times a day (TID) | ORAL | Status: DC
  Filled 2022-12-13: qty 3

## 2022-12-13 NOTE — Evaluation (Signed)
Occupational Therapy Evaluation Patient Details Name: [name] MRN: [id number] DOB: [birth year] Today's Date: 12/13/2022   History of Present Illness 77 y.o. 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 Dx of cdiff, possible UTI.   Clinical Impression   Patient re-evaled due to patient being transferred to [facility].  Currently, patient presenting with impulsivity and tangential conversation throughout evaluation, though oriented and alert. Patient with generalized weakness, unaware of urine soaked bed, decreased activity tolerance, and need for increased assist to complete all ADLs, especially lower body ADLs. Patient mod-max A for ADL management, and max A to complete stand pivot to recliner. Patient with lowered blood pressure, but asymptomatic throughout evaluation. Patient was independent prior to recent hospital admission, therefore OT recommending intensive rehab >3 hours prior to discharge home in hopes to return to previous level. OT will continue to follow acutely.      Recommendations for follow up therapy are one component of a multi-disciplinary discharge planning process, led by the attending physician.  Recommendations may be updated based on patient status, additional functional criteria and insurance authorization.   Assistance Recommended at Discharge Frequent or constant Supervision/Assistance  Patient can return home with the following A lot of help with walking and/or transfers;A lot of help with bathing/dressing/bathroom;Assistance with cooking/housework;Direct supervision/assist for medications management;Direct supervision/assist for financial management;Assist for transportation;Help with stairs or ramp for entrance    Functional Status Assessment   Patient has had a recent decline in their functional status and demonstrates the ability to make significant improvements in function in a reasonable and predictable amount of time.  Equipment Recommendations  Other (comment) (Defer to next venue)    Recommendations for Other Services Rehab consult     Precautions / Restrictions Precautions Precautions: Fall Precaution Comments: EEG monitoring Restrictions Weight Bearing Restrictions: No      Mobility Bed Mobility Overal bed mobility: Needs Assistance Bed Mobility: Supine to Sit     Supine to sit: Mod assist, HOB elevated, +2 for safety/equipment     General bed mobility comments: assist for trunk and scooting and for sitting balance    Transfers Overall transfer level: Needs assistance   Transfers: Bed to chair/wheelchair/BSC   Stand pivot transfers: Mod assist, +2 safety/equipment         General transfer comment: lifting help to stand and assist to pivot to recliner; OT assist for lines/safety and for hygiene with pt soiled with urine.      Balance Overall balance assessment: Needs assistance   Sitting balance-Leahy Scale: Poor Sitting balance - Comments: min A for balance with cues for trunk righting.     Standing balance-Leahy Scale: Zero Standing balance comment: mod/max A for standing balance                           ADL either performed or assessed with clinical judgement   ADL Overall ADL's : Needs assistance/impaired Eating/Feeding: Set up;Sitting   Grooming: Wash/dry hands;Wash/dry face;Sitting;Set up   Upper Body Bathing: Minimal assistance;Sitting   Lower Body Bathing: Maximal assistance;Sitting/lateral leans;Sit to/from stand   Upper Body Dressing : Minimal assistance;Sitting   Lower Body Dressing: Maximal assistance;Sit to/from stand;Sitting/lateral leans   Toilet Transfer: Moderate assistance;Maximal assistance;BSC/3in1 Statistician Details (indicate cue type and  reason): simulated with stand pivot  to recliner Toileting- Clothing Manipulation and Hygiene: Total assistance;Sit to/from stand;Sitting/lateral lean Toileting - Clothing Manipulation Details (indicate cue type and reason): requiring total A in standing for peri-care unaware of urine saturated bed     Functional mobility during ADLs: Moderate assistance;Cueing for safety;Cueing for sequencing General ADL Comments: Patient presenting with impulsivity and tangential conversation throughout evaluation, though oriented and alert. Patient with generalized weakness, unaware of urine soaked bed, decreased activity tolerance, and need for increased assist to complete all ADLs, especially lower body ADLs.     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, shoulders, etc Pain Descriptors / Indicators: Guarding, Moaning Pain Intervention(s): Limited activity within patient's tolerance, Monitored during session, Repositioned     Hand Dominance     Extremity/Trunk Assessment Upper Extremity Assessment Upper Extremity Assessment: Generalized weakness   Lower Extremity Assessment Lower Extremity Assessment: Defer to PT evaluation   Cervical / Trunk Assessment Cervical / Trunk Assessment: Kyphotic   Communication Communication Communication: No difficulties   Cognition Arousal/Alertness: Awake/alert Behavior During Therapy: WFL for tasks assessed/performed Overall Cognitive Status: Impaired/Different from baseline Area of Impairment: Safety/judgement                         Safety/Judgement: Decreased awareness of safety, Decreased awareness of deficits     General Comments: oriented x 4, but difficulty with  maintaining sitting balance, head righting, etc without cues     General Comments  VSS with BP 92/74 sitting EOB and slightly higher after OOB to chair; spouse in the room and supportive.  Reports Dr. Laneta Simmers is her son-in-law    Exercises     Shoulder Instructions      Home Living Family/patient expects to be discharged to:: Private residence Living Arrangements: Spouse/significant other Available Help at Discharge: Family;Available PRN/intermittently Type of Home: Other(Comment) (townhome) Home Access: Level entry     Home Layout: One level               Home Equipment: Rollator (4 wheels);Shower seat;BSC/3in1;Wheelchair - manual;Grab bars - tub/shower   Additional Comments: information taken from initial evaluation      Prior Functioning/Environment Prior Level of Function : Independent/Modified Independent             Mobility Comments: uses walker ADLs Comments: bathing/dressing MOD I        OT Problem List: Decreased strength;Decreased range of motion;Decreased activity tolerance;Impaired balance (sitting and/or standing);Decreased coordination;Decreased cognition;Decreased safety awareness;Decreased knowledge of use of DME or AE;Decreased knowledge of precautions      OT Treatment/Interventions: Self-care/ADL training;Therapeutic exercise;Neuromuscular education;Energy conservation;DME and/or AE instruction;Manual therapy;Therapeutic activities;Cognitive remediation/compensation;Patient/family education;Balance training    OT Goals(Current goals can be found in the care plan section) Acute Rehab OT Goals Patient Stated Goal: to get stronger OT Goal Formulation: With patient Time For Goal Achievement: 12/27/22 Potential to Achieve Goals: Good ADL Goals Pt Will Perform Lower Body Bathing: with min assist;sitting/lateral leans;sit to/from stand Pt Will Perform Lower Body Dressing: with min assist;sitting/lateral leans;sit to/from stand Pt Will Transfer to  Toilet: with min assist;ambulating;regular height toilet Additional ADL Goal #1: Patient will be able to complete functional task in standing for 2 minutes prior to needing seated rest break.  OT Frequency: Min 2X/week    Co-evaluation PT/OT/SLP Co-Evaluation/Treatment: Yes Reason for Co-Treatment: For patient/therapist safety;Necessary to address cognition/behavior during functional activity;To address functional/ADL transfers   OT goals addressed during session: ADL's and self-care;Proper use of Adaptive equipment and DME      AM-PAC OT "6 Clicks" Daily Activity     Outcome Measure Help from another person eating meals?: A Little Help from another person taking care of personal grooming?: A Little Help from another person toileting, which includes using toliet, bedpan, or urinal?: A Lot Help from another person bathing (including washing, rinsing, drying)?: A Lot Help from another person to put on and taking off regular upper body clothing?: A Little Help from another person to put on and taking off regular lower body clothing?: A Lot 6 Click Score: 15   End of Session Equipment Utilized During Treatment: Gait belt Nurse Communication: Mobility status  Activity Tolerance: Patient tolerated treatment well;Patient limited by fatigue Patient left: in chair;with call bell/phone within reach;with chair alarm set;with nursing/sitter in room;with family/visitor present  OT Visit Diagnosis: Unsteadiness on feet (R26.81);Other abnormalities of gait and mobility (R26.89);Muscle weakness (generalized) (M62.81);Other symptoms and signs involving cognitive function                Time: 8119-1478 OT Time Calculation (min): 29 min Charges:  OT General Charges $OT Visit: 1 Visit OT Evaluation $OT Eval Moderate Complexity: 1 Mod  Pollyann Glen E. Lorin Hauck, OTR/L Acute Rehabilitation Services [phone]   Cherlyn Cushing 12/13/2022, 3:56 PM

## 2022-12-13 NOTE — Procedures (Signed)
Patient Name: [name]  MRN: [id number]  Epilepsy Attending: Charlsie Quest  Referring Physician/Provider: Karren Burly, MD  Duration: 12/12/2022 1811 to 2041  Patient history: 76yoF with ams getting rapid EEG to evaluate for seizure   Level of alertness: Awake  AEDs during EEG study: None  Technical aspects: This EEG study was done with scalp electrodes positioned according to the 10-20 International system of electrode placement. Electrical activity was reviewed with band pass filter of 1-70Hz , sensitivity of 7 uV/mm, display speed of 31mm/sec with a  notched filter applied as appropriate. EEG data were recorded continuously and digitally stored.  Video monitoring was available and reviewed as appropriate.  Description: The posterior dominant rhythm consists of 9-10 Hz activity of moderate voltage (25-35 uV) seen predominantly in posterior head regions, symmetric and reactive to eye opening and eye closing. Hyperventilation and photic stimulation were not performed.     IMPRESSION: This rapid EEG is within normal limits. No seizures or epileptiform discharges were seen throughout the recording.  [name]

## 2022-12-13 NOTE — Progress Notes (Signed)
Nutrition Follow-up  DOCUMENTATION CODES:   Non-severe (moderate) malnutrition in context of social or environmental circumstances, Underweight  INTERVENTION:   Send 2 Magic cup supplements TID with meals, each supplement provides 290 kcal and 9 grams of protein  MVI with minerals daily  Vitamin A level pending  D/C Boost Breeze and Banatrol as pt refuses these   NUTRITION DIAGNOSIS:   Moderate Malnutrition related to social / environmental circumstances (depression) as evidenced by moderate fat depletion, moderate muscle depletion, percent weight loss. Ongoing.   GOAL:   Patient will meet greater than or equal to 90% of their needs Progressing but not met   MONITOR:   PO intake, Supplement acceptance, Labs, Weight trends, I & O's  REASON FOR ASSESSMENT:   Consult Assessment of nutrition requirement/status  ASSESSMENT:   77 year old F with PMH of persistent A-fib on Eliquis, hypothyroidism, HTN, HLD, GERD, depression and persistent UTI symptoms presenting with nausea, vomiting, abdominal pain, poor p.o. intake, weakness and UTI symptoms for about 5 days.  Pt discussed during ICU rounds and with RN.  Pt started on narcan drip overnight due to encephalopathy per MD likely due to fentanyl use  Pt sleepy during visit. Has cEEG running. Spoke with husband who reports that pt has not ate in about a week. Pt reports that she has been eating more over the last 2 days. Breakfast at bedside. Pt had ate most of her magic cup, states she likes these, and a bite or 2 of oatmeal, her juice and about 1/2 of her grits.   04/18 - admit with N/V/D found to have c.diff 04/23 - tx to ICU due to encephalopathy, on narcan drip   Medications reviewed and include: SSI, MVI with minerals KCl, florastor  NS @ 75 ml/hr Narcan Thiamine   Labs reviewed: K 3 Ammonia 26 (WNL) CBG's: 116-140  Diet Order:   Diet Order             DIET SOFT Room service appropriate? Yes; Fluid consistency:  Thin  Diet effective now                   EDUCATION NEEDS:   Education needs have been addressed  Skin:  Skin Assessment: Reviewed RN Assessment  Last BM:  4/22  Height:   Ht Readings from Last 1 Encounters:  12/12/22  (1.626 m)    Weight:   Wt Readings from Last 1 Encounters:  12/12/22 52.3 kg   BMI:  Body mass index is 19.79 kg/m.  Estimated Nutritional Needs:   Kcal:  1600-1800  Protein:  75-90g  Fluid:  1.8L/day  Raechell Singleton P., RD, LDN, CNSC See AMiON for contact information

## 2022-12-13 NOTE — Progress Notes (Signed)
Electrode maintenance complete. No skin breakdown at all skin sites.
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
## 2022-12-13 NOTE — Progress Notes (Signed)
° °  Inpatient Rehab Admissions Coordinator : ° °Per therapy change in recommendations, patient was screened for CIR candidacy by Sacha Radloff RN MSN.  At this time patient appears to be a potential candidate for CIR. I will place a rehab consult per protocol for full assessment. Please call me with any questions. ° °Graves Nipp RN MSN °Admissions Coordinator °[phone] °  °

## 2022-12-13 NOTE — Progress Notes (Addendum)
LTM EEG hooked up and running - no initial skin breakdown - push button tested - [facility] monitoring. MR compatible leads  

## 2022-12-13 NOTE — Progress Notes (Signed)
Neurology Progress Note  Brief HPI: [name] is a 77 y.o. 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 While at  [facility] she had episodes of decreased responsiveness with right sided asymmetry. She was transferred to [facility] for LTM.   Subjective: Patient seen in room. LTM in place. Does not recall episodes of decreased responsiveness and denies hx of stroke or seizure.   Exam: Vitals:   12/13/22 0500 12/13/22 0700  BP: 133/72 128/81  Pulse: 84 82  Resp: 19 19  Temp:    SpO2: 95% 98%   Gen: In bed, NAD Resp: non-labored breathing, no acute distress Abd: soft, nt  Neuro: Mental Status: AAox4. Sleeping, full awakens to verbal stimuli Speech is clear. Patient is able to give a clear and coherent history. No signs of aphasia or neglect Cranial Nerves: II: Visual Fields are full. PERRL.   III,IV, VI: EOMI without ptosis or diploplia.  V: Facial sensation is symmetric to temperature VII: Facial movement is symmetric resting and smiling VIII: Hearing is intact to voice X: Palate elevates symmetrically XI: Shoulder shrug is symmetric. XII: Tongue protrudes midline without atrophy or fasciculations.  Motor: Tone is normal. Bulk is normal. 5/5 strength was present in all four extremities.  Sensory: Sensation is symmetric to light touch and temperature in the arms and legs.  Cerebellar: FNF and HKS are intact bilaterally, rapid alternating movements Gait: steady or deferred for safety    Pertinent Labs: K 3.0 B1: Pending  Ammonia:  26  Imaging Reviewed: MRI: Motion degraded, aging brain without acute or reversible finding   CT Head: No evidence of acute intracranial hemorrhage or acute infarct. 8 mm dural-based calcified focus overlying the mid-to-posterior right frontal lobe. This is unchanged from the prior head CT of 06/17/2022 and may reflect a dural calcification or small calcified meningioma. No significant mass effect upon the underlying brain parenchyma. Mild chronic small vessel ischemic changes within the cerebral white matter. Mild generalized parenchymal atrophy. Mild ethmoid sinus disease, as described.  EEG 12/13/2022 0131 to 0840: This study is within normal limits. No seizures or epileptiform discharges were seen throughout the recording.   HVF 12/10/2020 OD: central island,stable compared to 05/2020  OS: extinguished likely progressed compared to 05/2020 Would recommend 10-2 for all future fields  OCT RNFL 12/10/2020 RNFL: OD: severe global thin, no change from 05/2020 OS: sever global thin, no change from 05/2020 Asymmetry Analysis: OD: moderate global thin  OS: moderate global thin     Impression:  Concern for delirium vs seizure 77 y.o. 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 While at  [facility] she had episodes of decreased responsiveness with right sided asymmetry concerning for seizure, however her exam is also consistent with delirium.    Recommendations: -LTM remains  in place -Appreciate continued excellent supportive care by CCM team including  holding fentanyl patch and mirtazapine -Thiamine 500 mg q8hr timed to start after level collected given longstanding GI issues and concern for malnutrition -Neurology will follow   Patient seen and examined by NP/APP with MD. MD to update note as needed.   Elmer Picker, DNP, FNP-BC [facility] Pager: [phone]   NEUROHOSPITALIST ADDENDUM Performed a face to face diagnostic evaluation.   I have reviewed the contents of history and physical exam as documented by PA/ARNP/Resident and agree with above documentation.  I have discussed and formulated the above plan as documented. Edits to the note have been made as needed.  Impression/Key exam findings/Plan: LTM for spell capture.  Erick Blinks, MD [facility] [phone]   If 7pm to 7am, please call on call as listed on AMION.
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
## 2022-12-13 NOTE — Procedures (Addendum)
Patient Name: RAIVYN KABLER  MRN: [id number]  Epilepsy Attending: Charlsie Quest  Referring Physician/Provider: Gordy Councilman, MD  Duration: 12/13/2022 0131 to 12/14/2022 0131  Patient history: 76yoF with ams. EEG to evaluate for seizure  Level of alertness: Awake, asleep  AEDs during EEG study: None  Technical aspects: This EEG study was done with scalp electrodes positioned according to the 10-20 International system of electrode placement. Electrical activity was reviewed with band pass filter of 1-70Hz , sensitivity of 7 uV/mm, display speed of 40mm/sec with a  notched filter applied as appropriate. EEG data were recorded continuously and digitally stored.  Video monitoring was available and reviewed as appropriate.  Description: The posterior dominant rhythm consists of 7.5 Hz activity of moderate voltage (25-35 uV) seen predominantly in posterior head regions, symmetric and reactive to eye opening and eye closing. Sleep was characterized by vertex waves, sleep spindles (12 to 14 Hz), maximal frontocentral region. Hyperventilation and photic stimulation were not performed.     EEG was disconnected between 0446 to 0558 on 12/13/2022 for MRI Brain  IMPRESSION: This study is within normal limits. No seizures or epileptiform discharges were seen throughout the recording.  A normal interictal EEG does not exclude the diagnosis of epilepsy.  Evens Meno Annabelle Harman

## 2022-12-13 NOTE — Evaluation (Signed)
Physical Therapy Re-Evaluation Patient Details Name: [name] MRN: [id number] DOB: 1945-10-20 Today's Date: 12/13/2022  History of Present Illness  77 y.o. 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 Dx of cdiff, possible UTI.  Clinical Impression  Patient presents with decreased mobility due to generalized weakness, decreased sitting balance, decreased head righting, decreased standing balance and poor activity tolerance.  She was previously independent with rollator.  Spouse present and able to assist at d/c.  Currently mod to max A of 2 for EOB and OOB.  Feel she may need post-acute inpatient rehab prior to d/c home.  PT will continue to follow.        Recommendations for follow up therapy are one component of a multi-disciplinary discharge planning process, led by the attending physician.  Recommendations may be updated based on patient status, additional functional criteria and insurance authorization.  Follow Up Recommendations       Assistance Recommended at Discharge Frequent or constant Supervision/Assistance  Patient can return home with the following       Equipment Recommendations Other (comment) (TBA)  Recommendations for Other Services  Rehab consult    Functional Status Assessment Patient has had a recent decline in their functional status and demonstrates the ability to make significant improvements in function in a reasonable and predictable amount of time.     Precautions / Restrictions Precautions Precautions: Fall Precaution Comments: EEG monitoring      Mobility  Bed Mobility Overal bed mobility: Needs Assistance Bed Mobility: Supine to Sit     Supine to sit: Mod assist, HOB elevated, +2 for safety/equipment     General bed mobility  comments: assist for trunk and scooting and for sitting balance    Transfers Overall transfer level: Needs assistance   Transfers: Bed to chair/wheelchair/BSC   Stand pivot transfers: Mod assist, +2 safety/equipment         General transfer comment: lifting help to stand and assist to pivot to recliner; OT assist for lines/safety and for hygiene with pt soiled with urine.    Ambulation/Gait                  Stairs            Wheelchair Mobility    Modified Rankin (Stroke Patients Only) Modified Rankin (Stroke Patients Only) Pre-Morbid Rankin Score: Moderate disability Modified Rankin: Severe disability     Balance Overall balance assessment: Needs assistance   Sitting balance-Leahy Scale: Poor Sitting balance - Comments: min A for balance with cues for trunk righting.     Standing balance-Leahy Scale: Zero Standing balance comment: mod/max A for standing balance                             Pertinent Vitals/Pain Pain Assessment Pain Assessment: Faces Faces Pain Scale: Hurts little more Pain Location: 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, shoulders, etc Pain Descriptors / Indicators: Guarding, Moaning Pain Intervention(s): Monitored during session, Repositioned    Home Living Family/patient expects to be discharged to:: Private residence Living Arrangements: Spouse/significant other Available Help at Discharge: Family;Available PRN/intermittently Type of Home:  (townhome) Home Access: Level entry       Home Layout: One level Home Equipment: Rollator (4 wheels);Shower seat;BSC/3in1;Wheelchair - manual;Grab bars - tub/shower Additional Comments: information  taken from initial evaluation    Prior Function Prior Level of Function : Independent/Modified Independent             Mobility Comments: uses walker ADLs Comments: bathing/dressing MOD I     Hand Dominance        Extremity/Trunk Assessment   Upper Extremity  Assessment Upper Extremity Assessment: Defer to OT evaluation    Lower Extremity Assessment Lower Extremity Assessment: Generalized weakness    Cervical / Trunk Assessment Cervical / Trunk Assessment: Kyphotic  Communication   Communication: No difficulties  Cognition Arousal/Alertness: Awake/alert Behavior During Therapy: WFL for tasks assessed/performed Overall Cognitive Status: Impaired/Different from baseline Area of Impairment: Safety/judgement                         Safety/Judgement: Decreased awareness of safety, Decreased awareness of deficits     General Comments: oriented x 4, but difficulty with maintaining sitting balance, head righting, etc without cues        General Comments General comments (skin integrity, edema, etc.): VSS with BP 92/74 sitting EOB and slightly higher after OOB to chair; spouse in the room and supportive.  Reports Dr. Laneta Simmers is her son-in-law    Exercises     Assessment/Plan    PT Assessment Patient needs continued PT services  PT Problem List Decreased strength;Decreased balance;Decreased mobility;Decreased activity tolerance;Decreased knowledge of use of DME;Decreased safety awareness;Decreased knowledge of precautions       PT Treatment Interventions DME instruction;Functional mobility training;Balance training;Patient/family education;Gait training;Therapeutic activities;Therapeutic exercise    PT Goals (Current goals can be found in the Care Plan section)  Acute Rehab PT Goals Patient Stated Goal: to return to independent PT Goal Formulation: With patient/family Time For Goal Achievement: 12/27/22 Potential to Achieve Goals: Good    Frequency Min 3X/week     Co-evaluation               AM-PAC PT "6 Clicks" Mobility  Outcome Measure Help needed turning from your back to your side while in a flat bed without using bedrails?: A Lot Help needed moving from lying on your back to sitting on the side of a flat  bed without using bedrails?: Total Help needed moving to and from a bed to a chair (including a wheelchair)?: Total Help needed standing up from a chair using your arms (e.g., wheelchair or bedside chair)?: Total Help needed to walk in hospital room?: Total Help needed climbing 3-5 steps with a railing? : Total 6 Click Score: 7    End of Session Equipment Utilized During Treatment: Gait belt Activity Tolerance: Patient limited by fatigue Patient left: in chair;with chair alarm set;with call bell/phone within reach;with family/visitor present   PT Visit Diagnosis: Other abnormalities of gait and mobility (R26.89);Muscle weakness (generalized) (M62.81);Other symptoms and signs involving the nervous system (R29.898)    Time: 4098-1191 PT Time Calculation (min) (ACUTE ONLY): 28 min   Charges:   PT Evaluation $PT Re-evaluation: 1 Re-eval          Sheran Lawless, PT Acute Rehabilitation Services Office:[phone] 12/13/2022   Elray Mcgregor 12/13/2022, 2:21 PM

## 2022-12-14 DIAGNOSIS — R5381 Other malaise: Secondary | ICD-10-CM | POA: Diagnosis not present

## 2022-12-14 DIAGNOSIS — G9341 Metabolic encephalopathy: Secondary | ICD-10-CM | POA: Diagnosis not present

## 2022-12-14 DIAGNOSIS — R4182 Altered mental status, unspecified: Secondary | ICD-10-CM | POA: Diagnosis not present

## 2022-12-14 DIAGNOSIS — R569 Unspecified convulsions: Secondary | ICD-10-CM | POA: Diagnosis not present

## 2022-12-14 DIAGNOSIS — R112 Nausea with vomiting, unspecified: Secondary | ICD-10-CM | POA: Diagnosis not present

## 2022-12-14 LAB — BASIC METABOLIC PANEL
Anion gap: 5 (ref 5–15)
BUN: 9 mg/dL (ref 8–23)
CO2: 25 mmol/L (ref 22–32)
Calcium: 8.8 mg/dL — ABNORMAL LOW (ref 8.9–10.3)
Chloride: 107 mmol/L (ref 98–111)
Creatinine, Ser: 0.94 mg/dL (ref 0.44–1.00)
GFR, Estimated: >60 mL/min (ref >60)
Glucose, Bld: 92 mg/dL (ref 70–99)
Potassium: 3.6 mmol/L (ref 3.5–5.1)
Sodium: 137 mmol/L (ref 135–145)

## 2022-12-14 LAB — GLUCOSE, CAPILLARY
Glucose-Capillary: 87 mg/dL (ref 70–99)
Glucose-Capillary: 115 mg/dL — ABNORMAL HIGH (ref 70–99)
Glucose-Capillary: 100 mg/dL — ABNORMAL HIGH (ref 70–99)
Glucose-Capillary: 159 mg/dL — ABNORMAL HIGH (ref 70–99)

## 2022-12-14 MED ORDER — LEVOTHYROXINE SODIUM 88 MCG PO TABS
88.0000 ug | ORAL_TABLET | Freq: Every day | ORAL | Status: DC
  Administered 2022-12-14 – 2022-12-15 (×2): 88 ug via ORAL
  Filled 2022-12-14 (×3): qty 1

## 2022-12-14 MED ORDER — THIAMINE MONONITRATE 100 MG PO TABS
100.0000 mg | ORAL_TABLET | Freq: Every day | ORAL | Status: DC
  Filled 2022-12-14: qty 1

## 2022-12-14 MED ORDER — GERHARDT'S BUTT CREAM: TOPICAL_CREAM | CUTANEOUS | Status: DC | PRN

## 2022-12-14 NOTE — Progress Notes (Signed)
PROGRESS NOTE  [name]  DOB: 1945/10/07  PCP: Chilton Greathouse, MD ZOX:[id number]  DOA: 12/07/2022  LOS: 6 days  Hospital Day: 8  Brief narrative: [name] is a 77 y.o. 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  Symptoms progressively worsening for 5 days associated with confusion.  She has lately been getting repeated course of antibiotics for recurrent UTI and has been on chronic suppressive antibiotics under the care of urologist..  2 days prior, she was seen in the ED.  Urinalysis showed large leukocytes.  She was discharged on oral cefpodoxime.  Symptoms worsened at home and hence presented to ED again on 4/18.  In the ED, she had a fever of 100.2, tachycardic to 107, blood pressure normal, breathing on room air Labs with WC count 11.3, sodium low at 132 Urinalysis showed pyuria, negative nitrite Patient was started on IV fluid, IV antiemetics and admitted to [facility]  Stool studies positive for C. difficile antigen, toxin negative. PCR positive. She was started on oral vancomycin. Hospital course was complicated by persistent altered mental status.  There was a concern of opioid overdose because of use of fentanyl. Narcan was tried for reversal without success. Neurology and PCCM were consulted.  She was placed on Narcan drip.  Neurology recommended continuous EEG monitoring to rule out seizures and hence she was transferred to [facility].  Continues EEG monitoring did not show any evidence of seizures. Narcan drip was weaned off after patient's mental status gradually improved. 4/25, transferred out to [facility].  Subjective: Patient was seen and examined this morning.  Pleasant  elderly Caucasian female.  Propped up in bed.  Taking her breakfast.  Granddaughter at bedside. Patient is alert, awake, oriented x 3 and able to give all her history.  She feels diarrhea is slowing down had 3-4 episodes in the last 24 hours, less watery. Chart reviewed In the last 24 hours, remains hemodynamically stable Labs this morning with unremarkable BMP  Assessment and plan: C. difficile infection Presented with nausea, vomiting, diarrhea, abdominal pain in the setting of recurrent antibiotic use for UTI.   C. difficile PCR positive.  Started on oral vancomycin and probiotics.  Diarrhea gradually improving. Protonix has been stopped Remains on IV fluid NS at 75 mill per hour.  Acute metabolic encephalopathy Patient was on fentanyl patch at home was continued in the hospital.  Her mental status worsened, improved with Narcan drip.  Continues EEG monitoring negative for seizure activity.  Off Narcan drip now. Continue to monitor mental status change   Pyuria Recurrent UTI Patient has recurrent urinary symptoms but persistently negative cultures.  On chronic suppressive antibiotic therapy under the care of urology. CTAP 4/18 unremarkable. This hospitalization, she had fever and pyuria and received empiric IV Rocephin for 3 days. Chronic suppressive antibiotic therapy is currently on hold.  Intractable nausea and vomiting Chronic nausea She had intractable nausea and vomiting on admission.  Probably because of recurrent UTI.  Currently able to tolerate diet with as needed antiemetics.  Hypokalemia Improved with replacement Recent Labs  Lab 12/08/22 0528 12/09/22 0839 12/10/22 1449 12/11/22 0533 12/12/22 0520 12/13/22 0412 12/14/22 0446  K 2.8*   < > 2.9* 3.9 3.4*  3.0* 3.6  MG 1.7  --   --   --   --  1.7  --    < > = values in this interval not displayed.   Hyponatremia Sodium level improved with replacement. Recent Labs  Lab 12/07/22 1301 12/08/22 0528  12/09/22 0839 12/10/22 1449 12/11/22 0533 12/12/22 0520 12/13/22 0412 12/14/22 0446  NA 132* 134* 134* 133* 136 135 135 137   Essential hypertension PTA on bisoprolol 2.5 mg daily, Lasix 20 mg daily as needed,  Currently blood pressure controlled on bisoprolol 5 mg daily, amlodipine 10 mg daily  Persistent A-fib Continue bisoprolol and Eliquis  H/o stroke 2012 HLD Has residual right eye partial vision loss Continue Eliquis and Lipitor  Hypothyroidism continue levothyroxine  COPD Respiratory status stable  Chronic pain on chronic opioids Patient states he was using fentanyl patch for last 30 years for chronic back pain.  Currently on hold because of overdose requiring reversal.  I have advised her to space out the use of fentanyl post discharge.  Anxiety/depression PTA on Wellbutrin 75 mg daily, Prozac 20 mg at bedtime, Neurontin 600 mg twice daily Continue both.  Impaired mobility Seen by PT.  CIR pending   Goals of care   Code Status: Full Code     DVT prophylaxis:   apixaban (ELIQUIS) tablet 5 mg   Antimicrobials: Oral vancomycin Fluid: NS at 75 mill per hour.  Continue for next 24 hours Consultants: Neurology, PCCM Family Communication: Granddaughter at bedside  Status: Inpatient Level of care:  Progressive   Needs to continue in-hospital care:  Gradually improving C. difficile diarrhea, mental status  Patient from: Home Anticipated d/c to: CIR recommended   Diet:  Diet Order             DIET SOFT Room service appropriate? Yes; Fluid consistency: Thin  Diet effective now                   Scheduled Meds:  amLODipine  10 mg Oral Daily   apixaban  5 mg Oral BID   bisoprolol  5 mg Oral Daily   buPROPion  75 mg Oral q morning   Chlorhexidine Gluconate Cloth  6 each Topical Daily   FLUoxetine  20 mg Oral QHS   insulin aspart  0-9 Units Subcutaneous Q4H   multivitamin with minerals  1 tablet Oral Daily   saccharomyces boulardii  250 mg  Oral BID   [START ON 12/16/2022] thiamine  100 mg Oral Daily   vancomycin  125 mg Oral QID    PRN meds: acetaminophen, hydrALAZINE, lip balm, naLOXone (NARCAN)  injection, ondansetron (ZOFRAN) IV   Infusions:   sodium chloride 75 mL/hr at 12/14/22 0700   thiamine (VITAMIN B1) injection 500 mg (12/14/22 0915)    Antimicrobials: Anti-infectives (From admission, onward)    Start     Dose/Rate Route Frequency Ordered Stop   12/13/22 0000  metroNIDAZOLE (FLAGYL) IVPB 500 mg        500 mg 100 mL/hr over 60 Minutes Intravenous Every 8 hours 12/12/22 2309 12/13/22 0954   12/09/22 1200  vancomycin (VANCOCIN) capsule 125 mg        125 mg Oral 4 times daily 12/09/22 0957 12/18/22 2359   12/08/22 1600  cefTRIAXone (ROCEPHIN) 1 g in sodium chloride 0.9 % 100 mL IVPB  Status:  Discontinued        1 g 200 mL/hr over 30 Minutes Intravenous Daily 12/08/22 1456 12/10/22 1229   12/07/22 2245  ciprofloxacin (CIPRO) IVPB 400 mg        400 mg 200 mL/hr over 60 Minutes Intravenous Every 12 hours 12/07/22 2145 12/08/22 1912       Nutritional status:  Body mass index is 19.79 kg/m.  Nutrition Problem: Moderate Malnutrition Etiology: social / environmental circumstances (depression) Signs/Symptoms: moderate fat depletion, moderate muscle depletion, percent weight loss     Objective: Vitals:   12/14/22 0700 12/14/22 0800  BP: 121/80   Pulse: 82   Resp: 20   Temp:  98.7 F (37.1 C)  SpO2: 96%     Intake/Output Summary (Last 24 hours) at 12/14/2022 0959 Last data filed at 12/14/2022 0700 Gross per 24 hour  Intake 1740.06 ml  Output 500 ml  Net 1240.06 ml   Filed Weights   12/08/22 0427 12/12/22 0400  Weight: 52.4 kg 52.3 kg   Weight change:  Body mass index is 19.79 kg/m.   Physical Exam: General exam: Pleasant, elderly.  Not in distress Skin: No rashes, lesions or ulcers. HEENT: Atraumatic, normocephalic, no obvious bleeding Lungs: Clear to auscultation bilaterally CVS:  Regular rate and rhythm, no murmur GI/Abd soft, nontender, nondistended, bowel sound present CNS: Alert, awake, oriented x 3 Psychiatry: Mood appropriate Extremities: No pedal edema, no calf tenderness  Data Review: I have personally reviewed the laboratory data and studies available.  F/u labs ordered. Unresulted Labs (From admission, onward)     Start     Ordered   12/13/22 0148  Vitamin B1  Once,   R       Question:  Specimen collection method  Answer:  Lab=Lab collect   12/13/22 0148           Total time spent in review of labs and imaging, patient evaluation, formulation of plan, documentation and communication with family: 55 minutes  Signed, Lorin Glass, MD [facility] 12/14/2022

## 2022-12-14 NOTE — Progress Notes (Signed)
LTM EEG discontinued - no skin breakdown at unhook.   

## 2022-12-14 NOTE — Progress Notes (Signed)
Inpatient Rehab Coordinator Note:  I met with patient, her spouse Alysia Penna) and her grand daughter Rayfield Citizen) at bedside to discuss CIR recommendations and goals/expectations of CIR stay.  We reviewed 3 hrs/day of therapy, physician follow up, and average length of stay 2 weeks (dependent upon progress) with goals of supervision to modified independent.  We discussed need for insurance prior auth if wanting to pursue CIR.  Pt has good support from her family (lives with spouse, has several children/grandchildren that are local and assist).  They would like to discuss CIR and will let me know if they'd like to pursue.    Estill Dooms, PT, DPT Admissions Coordinator [phone] 12/14/22  2:07 PM

## 2022-12-14 NOTE — Progress Notes (Signed)
Physical Therapy Treatment Patient Details Name: [name] MRN: [id number] DOB: April 18, 1946 Today's Date: 12/14/2022   History of Present Illness 77 y.o. 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 Dx of cdiff, possible UTI.    PT Comments    Patient progressing to in room ambulation this session needing mod A and RN assisting with lines.  She fatigued quickly, though was motivated with spouse and grandaughter in the room supporting.  VSS during mobility and in process of removing EEG lines.  Patient appropriate for post-acute intensive inpatient rehab prior to d/c home.   Recommendations for follow up therapy are one component of a multi-disciplinary discharge planning process, led by the attending physician.  Recommendations may be updated based on patient status, additional functional criteria and insurance authorization.  Follow Up Recommendations       Assistance Recommended at Discharge Frequent or constant Supervision/Assistance  Patient can return home with the following Assistance with cooking/housework;A lot of help with walking and/or transfers;A lot of help with bathing/dressing/bathroom;Help with stairs or ramp for entrance;Direct supervision/assist for medications management;Assist for transportation   Equipment Recommendations  Other (comment) (TBA)    Recommendations for Other Services       Precautions / Restrictions Precautions Precautions: Fall     Mobility  Bed Mobility Overal bed mobility: Needs Assistance Bed Mobility: Supine to Sit     Supine to sit: HOB elevated, Mod assist     General bed mobility comments: increased time, assist for trunk and scooting forward    Transfers Overall transfer level: Needs assistance Equipment used:  Rolling walker (2 wheels) Transfers: Sit to/from Stand Sit to Stand: Mod assist           General transfer comment: some lifting help to stand    Ambulation/Gait Ambulation/Gait assistance: Mod assist, +2 safety/equipment Gait Distance (Feet): 30 Feet Assistive device: Rolling walker (2 wheels) Gait Pattern/deviations: Step-to pattern, Step-through pattern, Decreased stride length, Trunk flexed, Shuffle       General Gait Details: in room with RW and RN assisting with lines for safety walked toward window then turned and walked toward door then turned to return to recliner; assist for balance, walker management and safety   Stairs             Wheelchair Mobility    Modified Rankin (Stroke Patients Only) Modified Rankin (Stroke Patients Only) Pre-Morbid Rankin Score: Moderate disability Modified Rankin: Moderately severe disability     Balance Overall balance assessment: Needs assistance   Sitting balance-Leahy Scale: Poor Sitting balance - Comments: falling over when reaching arms through gown min A for balance   Standing balance support: Bilateral upper extremity supported Standing balance-Leahy Scale: Poor Standing balance comment: UE support on walker with min to mod A for balance                            Cognition Arousal/Alertness: Awake/alert Behavior During Therapy: WFL for tasks assessed/performed Overall Cognitive Status: Within Functional Limits for tasks assessed                                          Exercises      General  Comments General comments (skin integrity, edema, etc.): BP stable after up to chair, spouse and grandaughter in the room and EEG being removed today      Pertinent Vitals/Pain Pain Assessment Faces Pain Scale: Hurts little more Pain Location: 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, shoulders, etc Pain Descriptors / Indicators: Guarding, Moaning Pain Intervention(s): Monitored during session,  Limited activity within patient's tolerance    Home Living                          Prior Function            PT Goals (current goals can now be found in the care plan section) Progress towards PT goals: Progressing toward goals    Frequency    Min 3X/week      PT Plan Current plan remains appropriate    Co-evaluation              AM-PAC PT "6 Clicks" Mobility   Outcome Measure  Help needed turning from your back to your side while in a flat bed without using bedrails?: A Lot Help needed moving from lying on your back to sitting on the side of a flat bed without using bedrails?: A Lot Help needed moving to and from a bed to a chair (including a wheelchair)?: A Lot Help needed standing up from a chair using your arms (e.g., wheelchair or bedside chair)?: A Lot Help needed to walk in hospital room?: A Lot Help needed climbing 3-5 steps with a railing? : Total 6 Click Score: 11    End of Session Equipment Utilized During Treatment: Gait belt Activity Tolerance: Patient limited by fatigue Patient left: with chair alarm set;in chair;with call bell/phone within reach;with family/visitor present   PT Visit Diagnosis: Other abnormalities of gait and mobility (R26.89);Muscle weakness (generalized) (M62.81);Other symptoms and signs involving the nervous system (R29.898)     Time: 1250-1315 PT Time Calculation (min) (ACUTE ONLY): 25 min  Charges:  $Gait Training: 8-22 mins $Therapeutic Activity: 8-22 mins                     Sheran Lawless, PT Acute Rehabilitation Services Office:[phone] 12/14/2022    Elray Mcgregor 12/14/2022, 3:13 PM
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
## 2022-12-14 NOTE — Progress Notes (Signed)
Neurology Progress Note  Brief HPI: [name] is a 77 y.o. 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 While at  [facility] she had episodes of decreased responsiveness with right sided asymmetry. She was transferred to [facility] for LTM.   Subjective: Patient seen in room. LTM in place. Does not recall episodes of decreased responsiveness and denies hx of stroke or seizure.   Exam: Vitals:   12/14/22 0700 12/14/22 0800  BP: 121/80   Pulse: 82   Resp: 20   Temp:  98.7 F (37.1 C)  SpO2: 96%    Gen: In bed, NAD Resp: non-labored breathing, no acute distress Abd: soft, nt  Neuro: Mental Status: AAox4. Sleeping, full awakens to verbal stimuli Speech is clear. Patient is able to give a clear and coherent history. No signs of aphasia or neglect Cranial Nerves: II: Visual Fields are full. PERRL.   III,IV, VI: EOMI without ptosis or diploplia.  V: Facial sensation is symmetric to temperature VII: Facial movement is symmetric resting and smiling VIII: Hearing is intact to voice X: Palate elevates symmetrically XI: Shoulder shrug is symmetric. XII: Tongue protrudes midline without atrophy or fasciculations.  Motor: Tone is normal. Bulk is normal. 5/5 strength was present in all four extremities.  Sensory: Sensation is symmetric to light touch and temperature in the arms and legs.  Cerebellar: FNF and HKS are intact bilaterally, rapid alternating movements Gait: steady or deferred for safety    Pertinent Labs: K 3.0 B1: Pending  Ammonia:  26  Imaging Reviewed: MRI: Motion degraded, aging brain without acute or reversible finding   CT Head: No evidence of acute intracranial hemorrhage or acute infarct. 8 mm dural-based calcified focus overlying the mid-to-posterior right frontal lobe. This is unchanged from the prior head CT of 06/17/2022 and may reflect a dural calcification or small calcified meningioma. No significant mass effect upon the underlying brain parenchyma. Mild chronic small vessel ischemic changes within the cerebral white matter. Mild generalized parenchymal atrophy. Mild ethmoid sinus disease, as described.  EEG 12/13/2022 0131 to 0840: This study is within normal limits. No seizures or epileptiform discharges were seen throughout the recording.   HVF 12/10/2020 OD: central island,stable compared to 05/2020  OS: extinguished likely progressed compared to 05/2020 Would recommend 10-2 for all future fields  OCT RNFL 12/10/2020 RNFL: OD: severe global thin, no change from 05/2020 OS: sever global thin, no change from 05/2020 Asymmetry Analysis: OD: moderate global thin  OS: moderate global thin     Impression:  Concern for delirium vs seizure 77 y.o. 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 While at  [facility] she had episodes of decreased responsiveness with right sided asymmetry concerning for seizure, however her exam is also consistent with delirium.  Recommendations: -Discontinue LTM -Appreciate continued excellent supportive care by CCM team including holding  fentanyl patch and mirtazapine -Thiamine 500 mg q8hr for 3 days and then  daily  - we will signoff.   Patient seen and examined by NP/APP with MD. MD to update note as needed.   Elmer Picker, DNP, FNP-BC [facility] Pager: [phone]  NEUROHOSPITALIST ADDENDUM Performed a face to face diagnostic evaluation.   I have reviewed the contents of history and physical exam as documented by PA/ARNP/Resident and agree with above documentation.  I have discussed and formulated the above plan as documented. Edits to the note have been made as needed.  Impression/Key exam findings/Plan: no events of decreased responsiveness with R facial droop over the last day and a half. LTM EEG is essentially normal. No interictal discharges. MRI brain is non revealing. Agree with judicious use of sedating medications. Will discontinue LTM EEG and signoff. Please feel free to contact us with any questions or concerns.  Thiamine high dose x 3 days, followed by  daily IV or PO.  Erick Blinks, MD [facility] [phone]   If 7pm to 7am, please call on call as listed on AMION.

## 2022-12-14 NOTE — Procedures (Addendum)
Patient Name: ADALYNE LOVICK  MRN: [id number]  Epilepsy Attending: Charlsie Quest  Referring Physician/Provider: Gordy Councilman, MD  Duration: 12/14/2022 0131 to 12/14/2022 1307   Patient history: 76yoF with ams. EEG to evaluate for seizure   Level of alertness: Awake, asleep   AEDs during EEG study: None   Technical aspects: This EEG study was done with scalp electrodes positioned according to the 10-20 International system of electrode placement. Electrical activity was reviewed with band pass filter of 1-70Hz , sensitivity of 7 uV/mm, display speed of 51mm/sec with a 60Hz  notched filter applied as appropriate. EEG data were recorded continuously and digitally stored.  Video monitoring was available and reviewed as appropriate.   Description: The posterior dominant rhythm consists of 7.5 Hz activity of moderate voltage (25-35 uV) seen predominantly in posterior head regions, symmetric and reactive to eye opening and eye closing. Sleep was characterized by vertex waves, sleep spindles (12 to 14 Hz), maximal frontocentral region. Hyperventilation and photic stimulation were not performed.       IMPRESSION: This study is within normal limits. No seizures or epileptiform discharges were seen throughout the recording.   A normal interictal EEG does not exclude the diagnosis of epilepsy.   Marybell Robards Annabelle Harman

## 2022-12-15 ENCOUNTER — Other Ambulatory Visit (HOSPITAL_COMMUNITY): Payer: Self-pay

## 2022-12-15 DIAGNOSIS — R112 Nausea with vomiting, unspecified: Secondary | ICD-10-CM | POA: Diagnosis not present

## 2022-12-15 LAB — GLUCOSE, CAPILLARY
Glucose-Capillary: 132 mg/dL — ABNORMAL HIGH (ref 70–99)
Glucose-Capillary: 79 mg/dL (ref 70–99)
Glucose-Capillary: 91 mg/dL (ref 70–99)
Glucose-Capillary: 99 mg/dL (ref 70–99)

## 2022-12-15 MED ORDER — AMLODIPINE BESYLATE 10 MG PO TABS
10.0000 mg | ORAL_TABLET | Freq: Every day | ORAL | 2 refills | Status: DC
  Filled 2022-12-15: qty 30, 30d supply, fill #0

## 2022-12-15 MED ORDER — GERHARDT'S BUTT CREAM
TOPICAL_CREAM | Freq: Three times a day (TID) | CUTANEOUS | Status: DC
  Filled 2022-12-15: qty 1

## 2022-12-15 MED ORDER — VANCOMYCIN HCL 125 MG PO CAPS
125.0000 mg | ORAL_CAPSULE | Freq: Four times a day (QID) | ORAL | 0 refills | Status: AC
  Filled 2022-12-15: qty 20, 5d supply, fill #0

## 2022-12-15 MED ORDER — SACCHAROMYCES BOULARDII 250 MG PO CAPS
250.0000 mg | ORAL_CAPSULE | Freq: Two times a day (BID) | ORAL | 0 refills | Status: AC
  Filled 2022-12-15: qty 28, 14d supply, fill #0

## 2022-12-15 NOTE — Consult Note (Signed)
   [facility] CM Inpatient Consult   12/15/2022  [name] March 23, 1946 [id number]  [facility] [[facility]]  Accountable Care Organization [ACO] Patient: [employer]  Primary Care Provider:  Chilton Greathouse, MD, [facility]   Patient screened for hospitalization with noted  medium risk score for unplanned readmission risk 8 day length of stay and  to assess for potential [facility]  [[facility]] Care Management service needs for post hospital transition for care coordination.  Review of patient's electronic medical record reveals patient is now for home with home health had been referred for inpatient rehab.  1515: Met with patient and family [daughter/granddaughter] at the bedside to explain [facility] Care Coordination for post hospital follow up.  Patient given a 24 hour nurse advise line magnet and an appointment reminder card to follow up with PCP.  Also, she will likely get a follow up TOC call to check her progress and any needs.  They verbalized understand and appreciation for information given. Patient states, "I rather go home as I have a sick husband. They are awaiting for TOC meds prior to going home with [facility] noted.   Plan:  Referral request for community care coordination: currently states no additional but would have the 24 hour nurse line now.  Of note, [facility] Care Management/Population Health does not replace or interfere with any arrangements made by the Inpatient Transition of Care team.  For questions contact:   Charlesetta Shanks, RN BSN CCM [facility]  [phone] business mobile phone Toll free office [phone]  *Concierge Line  [phone] Fax number: [phone] [email or web address]

## 2022-12-15 NOTE — TOC Transition Note (Signed)
Transition of Care [facility]) - CM/SW Discharge Note   Patient Details  Name: [name] MRN: [id number] Date of Birth: [birth year]  [facility]) CM/SW Contact:  Gordy Clement, RN Phone Number: 12/15/2022, 2:19 PM   Clinical Narrative:     CM met with Patient to assess for DC needs.  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 Granddaughter was also present in the room.  CM arranging HH  PT and OT for Patient from [facility] (choice). Patient states she has all DME in the home. AVS updated   No additional TOC needs           Patient Goals and CMS Choice      Discharge Placement                         Discharge Plan and Services Additional resources added to the After Visit Summary for                                       Social Determinants of Health (SDOH) Interventions SDOH Screenings   Food Insecurity: No Food Insecurity (12/07/2022)  Housing: Low Risk  (12/07/2022)  Transportation Needs: No Transportation Needs (12/07/2022)  Utilities: Not At Risk (12/07/2022)  Depression (PHQ2-9): Medium Risk (02/26/2019)  Tobacco Use: Medium Risk (12/10/2022)     Readmission Risk Interventions     No data to display

## 2022-12-15 NOTE — TOC Benefit Eligibility Note (Signed)
Patient Product/process development scientist completed.    The patient is currently admitted and upon discharge could be taking Vancomycin.  The current 30 day co-pay is $52.58.   The patient is insured through [employer] Part D   This test claim was processed through [facility]- copay amounts may vary at other pharmacies due to pharmacy/plan contracts, or as the patient moves through the different stages of their insurance plan.

## 2022-12-15 NOTE — Significant Event (Signed)
Patient discharging to home; reviewed AVS with patient. A copy of AVS given to patient. All personal belongings taken home with patient and her family. Taken to transportation via wheelchair with Char[employer] fundraiser.

## 2022-12-15 NOTE — Progress Notes (Signed)
Occupational Therapy Treatment Patient Details Name: [name] MRN: [id number] DOB: 1946/07/17 Today's Date: 12/15/2022   History of present illness 77 y.o. 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 Dx of cdiff, possible UTI.   OT comments  Patient received in bed and agreeable to transfer to recliner. Patient was mod assist and increased time to get to EOB and was able to maintain sitting balance while donning gown for back. Patient instructed on hand placement and mod assist for transfers. Patient provided HEP and yellow therapy band and instructed on exercises with family present to increase carryover. Patient will benefit from intensive inpatient follow up therapy, >3 hours/day. Acute OT to continue to follow    Recommendations for follow up therapy are one component of a multi-disciplinary discharge planning process, led by the attending physician.  Recommendations may be updated based on patient status, additional functional criteria and insurance authorization.    Assistance Recommended at Discharge Frequent or constant Supervision/Assistance  Patient can return home with the following  A lot of help with walking and/or transfers;A lot of help with bathing/dressing/bathroom;Assistance with cooking/housework;Direct supervision/assist for medications management;Direct supervision/assist for financial management;Assist for transportation;Help with stairs or ramp for entrance   Equipment Recommendations  Other (comment) (defer to next venue)    Recommendations for Other Services Rehab consult    Precautions / Restrictions Precautions Precautions: Fall Restrictions Weight Bearing Restrictions: No       Mobility Bed Mobility Overal bed mobility: Needs  Assistance Bed Mobility: Supine to Sit     Supine to sit: HOB elevated, Mod assist     General bed mobility comments: increased time and assistance for trunk and scooting to EOB    Transfers Overall transfer level: Needs assistance Equipment used: Rolling walker (2 wheels) Transfers: Sit to/from Stand, Bed to chair/wheelchair/BSC Sit to Stand: Mod assist     Step pivot transfers: Mod assist     General transfer comment: assistance with managing RW and cues for hand placement     Balance Overall balance assessment: Needs assistance Sitting-balance support: Feet supported, No upper extremity supported Sitting balance-Leahy Scale: Fair Sitting balance - Comments: supervision for sitting balance on EOB, able to donn gown on EOB without LOB   Standing balance support: Bilateral upper extremity supported Standing balance-Leahy Scale: Poor Standing balance comment: reliant on RW for support for standing balance                           ADL either performed or assessed with clinical judgement   ADL Overall ADL's : Needs assistance/impaired                 Upper Body Dressing : Minimal assistance;Sitting Upper Body Dressing Details (indicate cue type and reason): gown for back     Toilet Transfer: Moderate assistance;Rolling walker (2 wheels) Toilet Transfer Details (indicate cue type and reason): simulated with transfer to recliner           General ADL Comments: declined further self care due to being cold    Extremity/Trunk Assessment              Vision       Perception     Praxis      Cognition Arousal/Alertness: Awake/alert Behavior During Therapy: [facility]  for tasks assessed/performed Overall Cognitive Status: Within Functional Limits for tasks assessed Area of Impairment: Safety/judgement                         Safety/Judgement: Decreased awareness of safety, Decreased awareness of deficits     General Comments:  oriented x4        Exercises Exercises: General Upper Extremity General Exercises - Upper Extremity Shoulder Flexion: Strengthening, Both, 10 reps, Seated, Theraband Theraband Level (Shoulder Flexion): Level 1 (Yellow) Shoulder Horizontal ABduction: Strengthening, Both, 10 reps, Seated, Theraband Theraband Level (Shoulder Horizontal Abduction): Level 1 (Yellow) Elbow Flexion: Strengthening, Both, 10 reps, Seated, Theraband Theraband Level (Elbow Flexion): Level 1 (Yellow) Elbow Extension: Strengthening, Both, 10 reps, Seated, Theraband Theraband Level (Elbow Extension): Level 1 (Yellow)    Shoulder Instructions       General Comments      Pertinent Vitals/ Pain       Pain Assessment Pain Assessment: Faces Faces Pain Scale: Hurts a little bit Pain Location: generalized due to arthritis Pain Descriptors / Indicators: Grimacing, Guarding Pain Intervention(s): Limited activity within patient's tolerance, Monitored during session, Repositioned  Home Living                                          Prior Functioning/Environment              Frequency  Min 2X/week        Progress Toward Goals  OT Goals(current goals can now be found in the care plan section)  Progress towards OT goals: Progressing toward goals  Acute Rehab OT Goals Patient Stated Goal: go home OT Goal Formulation: With patient Time For Goal Achievement: 12/27/22 Potential to Achieve Goals: Good ADL Goals Pt Will Perform Eating: with set-up;sitting Pt Will Perform Grooming: with set-up;sitting Pt Will Perform Lower Body Bathing: with min assist;sitting/lateral leans;sit to/from stand Pt Will Perform Lower Body Dressing: with min assist;sitting/lateral leans;sit to/from stand Pt Will Transfer to Toilet: with min assist;ambulating;regular height toilet Pt Will Perform Toileting - Clothing Manipulation and hygiene: with min assist;sitting/lateral leans;sit to/from  stand Pt/caregiver will Perform Home Exercise Program: Increased ROM;Increased strength;Both right and left upper extremity;With Supervision Additional ADL Goal #1: Patient will be able to complete functional task in standing for 2 minutes prior to needing seated rest break.  Plan Discharge plan remains appropriate    Co-evaluation                 AM-PAC OT "6 Clicks" Daily Activity     Outcome Measure   Help from another person eating meals?: A Little Help from another person taking care of personal grooming?: A Little Help from another person toileting, which includes using toliet, bedpan, or urinal?: A Lot Help from another person bathing (including washing, rinsing, drying)?: A Lot Help from another person to put on and taking off regular upper body clothing?: A Little Help from another person to put on and taking off regular lower body clothing?: A Lot 6 Click Score: 15    End of Session Equipment Utilized During Treatment: Gait belt;Rolling walker (2 wheels)  OT Visit Diagnosis: Unsteadiness on feet (R26.81);Other abnormalities of gait and mobility (R26.89);Muscle weakness (generalized) (M62.81);Other symptoms and signs involving cognitive function   Activity Tolerance Patient tolerated treatment well   Patient Left in chair;with call bell/phone within reach;with family/visitor present  Nurse Communication Mobility status        Time: 0865-7846 OT Time Calculation (min): 30 min  Charges: OT General Charges $OT Visit: 1 Visit OT Treatments $Therapeutic Activity: 8-22 mins $Therapeutic Exercise: 8-22 mins  Alfonse Flavors, OTA Acute Rehabilitation Services  Office [phone]   Dewain Penning 12/15/2022, 10:58 AM

## 2022-12-15 NOTE — Progress Notes (Signed)
Inpatient Rehab Admissions Coordinator:   Met with pt and granddaughter at bedside.  At this time she does not want to pursue CIR.  She mentions that her family would like for her to, but she does not want to consider it.  I told her to please feel free to give me a call if she changed her mind.  TOC aware.   Estill Dooms, PT, DPT Admissions Coordinator [phone] 12/15/22  11:32 AM

## 2022-12-15 NOTE — Discharge Summary (Signed)
Physician Discharge Summary  [name]:[id number] DOB: 1946/04/02 DOA: 12/07/2022  PCP: Chilton Greathouse, MD  Admit date: 12/07/2022 Discharge date: 12/15/2022  Admitted From: Home Discharge disposition: Home with home health PT/OT  Recommendations at discharge:  Continue oral vancomycin for 5 days at home Minimize use of fentanyl patch.  Brief narrative: [name] is a 76 y.o. 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  Symptoms progressively worsening for 5 days associated with confusion.  She has lately been getting repeated course of antibiotics for recurrent UTI and has been on chronic suppressive antibiotics under the care of urologist..  2 days prior, she was seen in the ED.  Urinalysis showed large leukocytes.  She was discharged on oral cefpodoxime.  Symptoms worsened at home and hence presented to ED again on 4/18.  In the ED, she had a fever of 100.2, tachycardic to 107, blood pressure normal, breathing on room air Labs with WC count 11.3, sodium low at 132 Urinalysis showed pyuria, negative nitrite Patient was started on IV fluid, IV antiemetics and admitted to [facility]  Stool studies positive for C. difficile antigen, toxin negative. PCR positive. She was started on oral vancomycin. Hospital course was complicated by persistent altered mental status.  There was a concern of opioid overdose because of use of fentanyl. Narcan was tried for reversal without success. Neurology and PCCM were consulted.  She was placed on Narcan drip.  Neurology recommended continuous EEG monitoring to rule out seizures and hence she was transferred to [facility].  Continues EEG monitoring did  not show any evidence of seizures. Narcan drip was weaned off after patient's mental status gradually improved. 4/25, transferred out to [facility].  Subjective: Patient was seen and examined this morning. Pleasant elderly Caucasian female.  Feels better.  No diarrhea in the last 24 hours. Granddaughter at bedside. Patient does not want to consider CIR or SNF.  Patient and family wants for discharge to home today.  Hospital course: C. difficile infection Presented with nausea, vomiting, diarrhea, abdominal pain in the setting of recurrent antibiotic use for UTI.   C. difficile PCR positive. Started on oral vancomycin an.  Diarrhea improved.   Adequately hydrated. Continue oral vancomycin for 5 more days at home to complete 10-14 days course.  Acute metabolic encephalopathy Patient was on fentanyl patch at home was continued in the hospital.  Her mental status worsened, improved with Narcan drip.  Continues EEG monitoring negative for seizure activity.  Off Narcan drip subsequently. Continue to monitor mental status change   Pyuria Recurrent UTI Patient has recurrent urinary symptoms but persistently negative cultures.  On chronic suppressive antibiotic therapy under the care of urology. CTAP 4/18 unremarkable. This hospitalization, she had fever and pyuria and received empiric IV Rocephin for 3 days. Chronic suppressive antibiotic therapy is currently on hold.  Intractable nausea and vomiting Chronic nausea She had intractable nausea and vomiting on admission.  Probably because of recurrent UTI.  Currently able to tolerate diet with as needed antiemetics.  Hypokalemia Improved with replacement Recent Labs  Lab 12/10/22 1449 12/11/22 0533 12/12/22 0520 12/13/22 0412 12/14/22 0446  K 2.9* 3.9 3.4* 3.0* 3.6  MG  --   --   --  1.7  --    Hyponatremia Sodium level improved with replacement. Recent Labs  Lab 12/09/22 0839 12/10/22 1449 12/11/22 0533 12/12/22 0520 12/13/22 0412  12/14/22 0446  NA 134* 133* 136 135 135 137   Essential hypertension PTA on bisoprolol 2.5 mg daily, Lasix 20 mg daily as needed,  Currently blood pressure controlled on bisoprolol 5 mg daily, amlodipine 10 mg daily  Persistent A-fib Continue bisoprolol and Eliquis  H/o stroke 2012 HLD Has residual right eye partial vision loss Continue Eliquis and Lipitor  Hypothyroidism continue levothyroxine  COPD Respiratory status stable  Chronic pain on chronic opioids Patient states he was using fentanyl patch for last 30 years for chronic back pain.  Currently on hold because of overdose requiring reversal.  I have advised her to space out the use of fentanyl post discharge.  Anxiety/depression PTA on Wellbutrin 75 mg daily, Prozac 20 mg at bedtime, Neurontin 600 mg twice daily Continue as before.  Impaired mobility Seen by PT.  Patient was recommended CIR, SNF.  She wants a discharge to home.   Goals of care   Code Status: Full Code   Wounds:  -    Discharge Exam:   Vitals:   12/15/22 0400 12/15/22 0600 12/15/22 0833 12/15/22 1140  BP:   98/74 (!) 105/54  Pulse:   75 81  Resp: 17  16 18   Temp:   98.2 F (36.8 C) 98.8 F (37.1 C)  TempSrc:   Oral Oral  SpO2:   99% 99%  Weight:  53.9 kg    Height:        Body mass index is 20.4 kg/m.   General exam: Pleasant, elderly.  Not in distress. Skin: No rashes, lesions or ulcers. HEENT: Atraumatic, normocephalic, no obvious bleeding Lungs: Clear to auscultation bilaterally CVS: Regular rate and rhythm, no murmur GI/Abd soft, nontender, nondistended, bowel sound present CNS: Alert, awake, oriented x 3 Psychiatry: Mood appropriate Extremities: No pedal edema, no calf tenderness  Follow ups:    Follow-up Information     Avva, Ravisankar, MD. Schedule an appointment as soon as possible for a visit .   Specialty: Internal Medicine Contact information: [address] [place] [address] [phone]          [facility]. Schedule an appointment as soon as possible for a visit in 2 days.   Contact information: [address] [place] [address] [phone]        Chilton Greathouse, MD Follow up.   Specialty: Internal Medicine Contact information: [address] [place] [address] [phone]                 Discharge Instructions:   Discharge Instructions     Call MD for:  difficulty breathing, headache or visual disturbances   Complete by: As directed    Call MD for:  extreme fatigue   Complete by: As directed    Call MD for:  hives   Complete by: As directed    Call MD for:  persistant dizziness or light-headedness   Complete by: As directed    Call MD for:  persistant nausea and vomiting   Complete by: As directed    Call MD for:  severe uncontrolled pain   Complete by: As directed    Call MD for:  temperature >100.4   Complete by: As directed    Diet general   Complete by: As directed    Discharge instructions   Complete  by: As directed    Recommendations at discharge:   Continue oral vancomycin for 5 days at home  Minimize use of fentanyl patch.  General discharge instructions: Follow with Primary MD Chilton Greathouse, MD in 7 days  Please request your PCP  to go over your hospital tests, procedures, radiology results at the follow up. Please get your medicines reviewed and adjusted.  Your PCP may decide to repeat certain labs or tests as needed. Do not drive, operate heavy machinery, perform activities at heights, swimming or participation in water activities or provide baby sitting services if your were admitted for syncope or siezures until you have seen by Primary MD or a Neurologist and advised to do so again. [place] Controlled Substance Reporting System database was reviewed. Do not drive, operate heavy machinery, perform activities at heights, swim, participate in water activities or provide  baby-sitting services while on medications for pain, sleep and mood until your outpatient physician has reevaluated you and advised to do so again.  You are strongly recommended to comply with the dose, frequency and duration of prescribed medications. Activity: As tolerated with Full fall precautions use walker/cane & assistance as needed Avoid using any recreational substances like cigarette, tobacco, alcohol, or non-prescribed drug. If you experience worsening of your admission symptoms, develop shortness of breath, life threatening emergency, suicidal or homicidal thoughts you must seek medical attention immediately by calling 911 or calling your MD immediately  if symptoms less severe. You must read complete instructions/literature along with all the possible adverse reactions/side effects for all the medicines you take and that have been prescribed to you. Take any new medicine only after you have completely understood and accepted all the possible adverse reactions/side effects.  Wear Seat belts while driving. You were cared for by a hospitalist during your hospital stay. If you have any questions about your discharge medications or the care you received while you were in the hospital after you are discharged, you can call the unit and ask to speak with the hospitalist or the covering physician. Once you are discharged, your primary care physician will handle any further medical issues. Please note that NO REFILLS for any discharge medications will be authorized once you are discharged, as it is imperative that you return to your primary care physician (or establish a relationship with a primary care physician if you do not have one).   Increase activity slowly   Complete by: As directed        Discharge Medications:   Allergies as of 12/15/2022       Reactions   Penicillins Rash        Medication List     STOP taking these medications    cefpodoxime 200 MG tablet Commonly known  as: VANTIN   fentaNYL 37.5 MCG/HR Pt72   methenamine 1 g tablet Commonly known as: HIPREX   pantoprazole 40 MG tablet Commonly known as: PROTONIX   polyethylene glycol 17 g packet Commonly known as: MiraLax       TAKE these medications    acetaminophen 325 MG tablet Commonly known as: TYLENOL Take 2 tablets (650 mg total) by mouth every 8 (eight) hours. What changed: when to take this   amLODipine 10 MG tablet Commonly known as: NORVASC Take 1 tablet (10 mg total) by mouth daily. Start taking on: December 16, 2022   apixaban 5 MG Tabs tablet Commonly known as: ELIQUIS Take 1 tablet (5 mg total) by mouth 2 (two) times daily.  ascorbic acid 500 MG tablet Commonly known as: VITAMIN C Take 500 mg by mouth daily.   atorvastatin 10 MG tablet Commonly known as: LIPITOR Take 1 tablet (10 mg total) by mouth daily.   bisoprolol 5 MG tablet Commonly known as: ZEBETA Take 2.5 mg by mouth daily.   buPROPion 75 MG tablet Commonly known as: WELLBUTRIN Take 75 mg by mouth every morning.   diclofenac sodium 1 % Gel Commonly known as: VOLTAREN Apply 2 g topically 4 (four) times daily. What changed:  when to take this reasons to take this   FLUoxetine 20 MG capsule Commonly known as: PROZAC Take 1 capsule (20 mg total) by mouth at bedtime.   furosemide 20 MG tablet Commonly known as: Lasix Take 1 tablet (20 mg total) by mouth as needed. What changed: when to take this   gabapentin 600 MG tablet Commonly known as: NEURONTIN Take 600 mg by mouth in the morning and at bedtime.   hyoscyamine 0.125 MG SL tablet Commonly known as: LEVSIN SL Place 0.125 mg under the tongue every 4 (four) hours as needed for cramping.   levothyroxine 88 MCG tablet Commonly known as: Synthroid Take 1 tablet (88 mcg total) by mouth daily before breakfast.   ondansetron 4 MG disintegrating tablet Commonly known as: Zofran ODT Take 1 tablet (4 mg total) by mouth every 8 (eight) hours as  needed for nausea or vomiting.   ondansetron 4 MG tablet Commonly known as: ZOFRAN Take 1 tablet (4 mg total) by mouth every 6 (six) hours.   saccharomyces boulardii 250 MG capsule Commonly known as: FLORASTOR Take 1 capsule (250 mg total) by mouth 2 (two) times daily for 14 days.   vancomycin 125 MG capsule Commonly known as: VANCOCIN Take 1 capsule (125 mg total) by mouth 4 (four) times daily for 5 days.   Vitamin D 125 MCG (5000 UT) Caps Take 5,000 Units by mouth daily.         The results of significant diagnostics from this hospitalization (including imaging, microbiology, ancillary and laboratory) are listed below for reference.    Procedures and Diagnostic Studies:   CT ABDOMEN PELVIS W CONTRAST  Result Date: 12/07/2022 CLINICAL DATA:  Acute abdominal pain, urinary tract infection, intermittent vomiting EXAM: CT ABDOMEN AND PELVIS WITH CONTRAST TECHNIQUE: Multidetector CT imaging of the abdomen and pelvis was performed using the standard protocol following bolus administration of intravenous contrast. RADIATION DOSE REDUCTION: This exam was performed according to the departmental dose-optimization program which includes automated exposure control, adjustment of the mA and/or kV according to patient size and/or use of iterative reconstruction technique. CONTRAST:  80mL OMNIPAQUE IOHEXOL 300 MG/ML  SOLN COMPARISON:  06/17/2022, 04/29/2021, 04/29/2016 FINDINGS: Lower chest: No acute pleural or parenchymal lung disease. Hepatobiliary: Stable 1 cm hemangioma is again seen within the left lobe liver reference image 16/2. Indeterminate area of indistinct increased attenuation during the early phase of the exam reference image 20/2 measuring 1.7 cm likely reflects transient hepatic attenuation difference or flash fill hemangioma. Numerous hepatic cysts are again noted. No intrahepatic biliary duct dilation. Prior cholecystectomy, with expected postsurgical dilatation of the common bile  duct. No change in the partially calcified low-attenuation lesion adjacent to the left lobe liver, felt to represent benign hemangioma based on previous imaging findings. Pancreas: Chronic pancreatic duct dilation unchanged. Pancreatic divisum again noted. The pancreas is unremarkable without acute finding. Spleen: Normal in size without focal abnormality. Adrenals/Urinary Tract: Solitary left kidney demonstrates normal parenchymal enhancement. No urinary  tract calculus or obstructive uropathy. Normal excretion of contrast on delayed imaging. The bilateral adrenals are unremarkable. The bladder is decompressed, limiting its evaluation. Evaluation of the lower pelvis is limited due to streak artifact from right hip arthroplasty. Stomach/Bowel: No bowel obstruction or ileus. No bowel wall thickening or inflammatory change. Vascular/Lymphatic: Aortic atherosclerosis. 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 No enlarged abdominal or pelvic lymph nodes. Reproductive: Status post hysterectomy. No adnexal masses. Evaluation of the lower pelvis is limited by streak artifact from right hip arthroplasty. Other: No free fluid or free intraperitoneal gas. No abdominal wall hernia. Musculoskeletal: Right hip arthroplasty. No acute or destructive bony lesions. Postsurgical changes from prior L3-4 laminectomy and posterior fusion. Reconstructed images demonstrate no additional findings. IMPRESSION: 1. No acute intra-abdominal or intrapelvic process. 2. Unremarkable solitary left kidney. Evaluation of the bladder is limited due to incomplete distension and streak artifact from right hip arthroplasty. If urinary tract infection remains a concern, correlation with urinalysis is recommended. 3. 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 4.  Aortic Atherosclerosis (ICD10-I70.0). Electronically Signed   By: Sharlet Salina M.D.   On: 12/07/2022 15:41   DG Chest Portable 1 View  Result Date: 12/07/2022 CLINICAL DATA:  Malaise EXAM:  PORTABLE CHEST 1 VIEW COMPARISON:  05/27/2020 FINDINGS: Stable enlargement of the cardiopericardial silhouette. Atherosclerotic calcification of the aortic arch. The lungs appear clear.  No blunting of the costophrenic angles. Reverse lordotic projection possibly due to underlying kyphosis. IMPRESSION: 1. Stable enlargement of the cardiopericardial silhouette, without edema. 2. Reverse lordotic projection possibly due to underlying kyphosis. Electronically Signed   By: Gaylyn Rong M.D.   On: 12/07/2022 15:07     Labs:   Basic Metabolic Panel: Recent Labs  Lab 12/10/22 1449 12/11/22 0533 12/12/22 0520 12/13/22 0412 12/14/22 0446  NA 133* 136 135 135 137  K 2.9* 3.9 3.4* 3.0* 3.6  CL 100 103 100 103 107  CO2 22 22 24 26 25   GLUCOSE 109* 100* 121* 118* 92  BUN 16 15 15 8 9   CREATININE 0.65 0.76 0.92 0.92 0.94  CALCIUM 9.6 9.7 9.3 9.0 8.8*  MG  --   --   --  1.7  --    GFR Estimated Creatinine Clearance: 42.6 mL/min (by C-G formula based on SCr of 0.94 mg/dL). Liver Function Tests: Recent Labs  Lab 12/13/22 0412  AST 16  ALT 15  ALKPHOS 52  BILITOT 1.2  PROT 5.1*  ALBUMIN 2.9*   No results for input(s): "LIPASE", "AMYLASE" in the last 168 hours. Recent Labs  Lab 12/12/22 0822  AMMONIA 26   Coagulation profile No results for input(s): "INR", "PROTIME" in the last 168 hours.  CBC: Recent Labs  Lab 12/09/22 0839 12/10/22 1449 12/11/22 0533 12/12/22 0520 12/13/22 0412  WBC 15.4* 14.1* 12.9* 12.1* 9.9  NEUTROABS  --   --   --   --  6.6  HGB 16.4* 16.3* 16.9* 15.6* 15.8*  HCT 47.9* 47.3* 49.6* 46.2* 46.4*  MCV 91.6 90.3 91.3 93.3 91.5  PLT 332 354 357 326 310   Cardiac Enzymes: No results for input(s): "CKTOTAL", "CKMB", "CKMBINDEX", "TROPONINI" in the last 168 hours. BNP: Invalid input(s): "POCBNP" CBG: Recent Labs  Lab 12/14/22 2122 12/15/22 0025 12/15/22 0351 12/15/22 0830 12/15/22 1134  GLUCAP 159* 79 99 132* 91   D-Dimer No results for  input(s): "DDIMER" in the last 72 hours. Hgb A1c No results for input(s): "HGBA1C" in the last 72 hours. Lipid Profile No results  for input(s): "CHOL", "HDL", "LDLCALC", "TRIG", "CHOLHDL", "LDLDIRECT" in the last 72 hours. Thyroid function studies Recent Labs    12/13/22 0412  TSH 1.228   Anemia work up No results for input(s): "VITAMINB12", "FOLATE", "FERRITIN", "TIBC", "IRON", "RETICCTPCT" in the last 72 hours. Microbiology Recent Results (from the past 240 hour(s))  Urine Culture (for pregnant, neutropenic or urologic patients or patients with an indwelling urinary catheter)     Status: None   Collection Time: 12/07/22  2:45 PM   Specimen: Urine, Clean Catch  Result Value Ref Range Status   Specimen Description   Final    URINE, CLEAN CATCH Performed at [facility], [address], [place], [place] [address]    Special Requests   Final    NONE Performed at [facility], [address] [place], [place] [address]    Culture   Final    NO GROWTH Performed at [facility], [address] [place], [place] [address]    Report Status 12/09/2022 FINAL  Final  SARS Coronavirus 2 by RT PCR (hospital order, performed in [facility] hospital lab) *cepheid single result test* Anterior Nasal Swab     Status: None   Collection Time: 12/07/22  6:35 PM   Specimen: Anterior Nasal Swab  Result Value Ref Range Status   SARS Coronavirus 2 by RT PCR NEGATIVE NEGATIVE Final    Comment: (NOTE) SARS-CoV-2 target nucleic acids are NOT DETECTED.  The SARS-CoV-2 RNA is generally detectable in upper and lower respiratory specimens during the acute phase of infection. The lowest concentration of SARS-CoV-2 viral copies this assay can detect is 250 copies / mL. A negative result does not preclude SARS-CoV-2 infection and should not be used as the sole basis for treatment or other patient management decisions.  A negative result may occur with improper  specimen collection / handling, submission of specimen other than nasopharyngeal swab, presence of viral mutation(s) within the areas targeted by this assay, and inadequate number of viral copies (<250 copies / mL). A negative result must be combined with clinical observations, patient history, and epidemiological information.  Fact Sheet for Patients:   RoadLapTop.co.za  Fact Sheet for Healthcare Providers: http://kim-miller.com/  This test is not yet approved or  cleared by the [place] FDA and has been authorized for detection and/or diagnosis of SARS-CoV-2 by FDA under an Emergency Use Authorization (EUA).  This EUA will remain in effect (meaning this test can be used) for the duration of the COVID-19 declaration under Section 564(b)(1) of the Act, 21 U.S.C. section 360bbb-3(b)(1), unless the authorization is terminated or revoked sooner.  Performed at [facility], [address], [place], [place] [address]   C Difficile Quick Screen w PCR reflex     Status: Abnormal   Collection Time: 12/08/22 11:07 AM   Specimen: STOOL  Result Value Ref Range Status   C Diff antigen POSITIVE (A) NEGATIVE Final   C Diff toxin NEGATIVE NEGATIVE Final   C Diff interpretation Results are indeterminate. See PCR results.  Final    Comment: Performed at [facility], [address] [place], [place] [address]  C. Diff by PCR, Reflexed     Status: Abnormal   Collection Time: 12/08/22 11:07 AM  Result Value Ref Range Status   Toxigenic C. Difficile by PCR POSITIVE (A) NEGATIVE Final    Comment: Positive for toxigenic C. difficile with little to no toxin production. Only treat if clinical  presentation suggests symptomatic illness. Performed at [facility], [address] [place], [place] [address]   MRSA Next Gen by PCR, Nasal     Status: None   Collection Time: 12/12/22 10:33 AM   Specimen: Nasal  Mucosa; Nasal Swab  Result Value Ref Range Status   MRSA by PCR Next Gen NOT DETECTED NOT DETECTED Final    Comment: (NOTE) The GeneXpert MRSA Assay (FDA approved for NASAL specimens only), is one component of a comprehensive MRSA colonization surveillance program. It is not intended to diagnose MRSA infection nor to guide or monitor treatment for MRSA infections. Test performance is not FDA approved in patients less than 50 years old. Performed at [facility], [address], [place], [place] [address]     Time coordinating discharge: 45 minutes  Signed: Melina Schools Jerrett Baldinger  [facility] 12/15/2022, 2:12 PM

## 2022-12-18 ENCOUNTER — Telehealth: Payer: Self-pay | Admitting: Cardiology

## 2022-12-18 ENCOUNTER — Telehealth (HOSPITAL_COMMUNITY): Payer: Self-pay | Admitting: Pharmacy Technician

## 2022-12-18 ENCOUNTER — Other Ambulatory Visit (HOSPITAL_COMMUNITY): Payer: Self-pay

## 2022-12-18 LAB — VITAMIN B1: Vitamin B1 (Thiamine): 164.7 nmol/L (ref 66.5–200.0)

## 2022-12-18 NOTE — Telephone Encounter (Signed)
Patient wants a call back directly from [facility].  Patient stated she just got out of the hospital.

## 2022-12-18 NOTE — Telephone Encounter (Signed)
Patient Advocate Encounter  Prior Authorization for Vancomycin HCl 125MG  capsules has been approved.    PA# [id number] Key: [employer] Electronic PA Form Effective dates: 12/15/2022 through 08/21/2023  Patients co-pay is $52.58.     Roland Earl, CPhT Pharmacy Patient Advocate Specialist [facility] Patient Advocate Team Direct Number: [phone]  Fax: [phone]

## 2022-12-18 NOTE — Telephone Encounter (Signed)
Spoke to patient stated she was calling to schedule post hospital appointment.Advised Dr.Jordan's schedule is full.Appointment scheduled with Bernadene Person NP 5/13 at 1:55 pm.

## 2022-12-19 ENCOUNTER — Ambulatory Visit: Payer: Medicare HMO | Admitting: Cardiology

## 2022-12-19 DIAGNOSIS — M47813 Spondylosis without myelopathy or radiculopathy, cervicothoracic region: Secondary | ICD-10-CM | POA: Diagnosis not present

## 2022-12-19 DIAGNOSIS — J449 Chronic obstructive pulmonary disease, unspecified: Secondary | ICD-10-CM | POA: Diagnosis not present

## 2022-12-19 DIAGNOSIS — I4819 Other persistent atrial fibrillation: Secondary | ICD-10-CM | POA: Diagnosis not present

## 2022-12-19 DIAGNOSIS — I129 Hypertensive chronic kidney disease with stage 1 through stage 4 chronic kidney disease, or unspecified chronic kidney disease: Secondary | ICD-10-CM | POA: Diagnosis not present

## 2022-12-19 DIAGNOSIS — N390 Urinary tract infection, site not specified: Secondary | ICD-10-CM | POA: Diagnosis not present

## 2022-12-19 DIAGNOSIS — N189 Chronic kidney disease, unspecified: Secondary | ICD-10-CM | POA: Diagnosis not present

## 2022-12-19 DIAGNOSIS — G9341 Metabolic encephalopathy: Secondary | ICD-10-CM | POA: Diagnosis not present

## 2022-12-19 DIAGNOSIS — M47812 Spondylosis without myelopathy or radiculopathy, cervical region: Secondary | ICD-10-CM | POA: Diagnosis not present

## 2022-12-19 DIAGNOSIS — A0472 Enterocolitis due to Clostridium difficile, not specified as recurrent: Secondary | ICD-10-CM | POA: Diagnosis not present

## 2022-12-20 DIAGNOSIS — M47813 Spondylosis without myelopathy or radiculopathy, cervicothoracic region: Secondary | ICD-10-CM | POA: Diagnosis not present

## 2022-12-20 DIAGNOSIS — N390 Urinary tract infection, site not specified: Secondary | ICD-10-CM | POA: Diagnosis not present

## 2022-12-20 DIAGNOSIS — J449 Chronic obstructive pulmonary disease, unspecified: Secondary | ICD-10-CM | POA: Diagnosis not present

## 2022-12-20 DIAGNOSIS — M47812 Spondylosis without myelopathy or radiculopathy, cervical region: Secondary | ICD-10-CM | POA: Diagnosis not present

## 2022-12-20 DIAGNOSIS — N189 Chronic kidney disease, unspecified: Secondary | ICD-10-CM | POA: Diagnosis not present

## 2022-12-20 DIAGNOSIS — G9341 Metabolic encephalopathy: Secondary | ICD-10-CM | POA: Diagnosis not present

## 2022-12-20 DIAGNOSIS — I129 Hypertensive chronic kidney disease with stage 1 through stage 4 chronic kidney disease, or unspecified chronic kidney disease: Secondary | ICD-10-CM | POA: Diagnosis not present

## 2022-12-20 DIAGNOSIS — I4819 Other persistent atrial fibrillation: Secondary | ICD-10-CM | POA: Diagnosis not present

## 2022-12-20 DIAGNOSIS — A0472 Enterocolitis due to Clostridium difficile, not specified as recurrent: Secondary | ICD-10-CM | POA: Diagnosis not present

## 2022-12-22 DIAGNOSIS — J449 Chronic obstructive pulmonary disease, unspecified: Secondary | ICD-10-CM | POA: Diagnosis not present

## 2022-12-22 DIAGNOSIS — A0472 Enterocolitis due to Clostridium difficile, not specified as recurrent: Secondary | ICD-10-CM | POA: Diagnosis not present

## 2022-12-22 DIAGNOSIS — M47812 Spondylosis without myelopathy or radiculopathy, cervical region: Secondary | ICD-10-CM | POA: Diagnosis not present

## 2022-12-22 DIAGNOSIS — N189 Chronic kidney disease, unspecified: Secondary | ICD-10-CM | POA: Diagnosis not present

## 2022-12-22 DIAGNOSIS — I4819 Other persistent atrial fibrillation: Secondary | ICD-10-CM | POA: Diagnosis not present

## 2022-12-22 DIAGNOSIS — N390 Urinary tract infection, site not specified: Secondary | ICD-10-CM | POA: Diagnosis not present

## 2022-12-22 DIAGNOSIS — M47813 Spondylosis without myelopathy or radiculopathy, cervicothoracic region: Secondary | ICD-10-CM | POA: Diagnosis not present

## 2022-12-22 DIAGNOSIS — I129 Hypertensive chronic kidney disease with stage 1 through stage 4 chronic kidney disease, or unspecified chronic kidney disease: Secondary | ICD-10-CM | POA: Diagnosis not present

## 2022-12-22 DIAGNOSIS — G9341 Metabolic encephalopathy: Secondary | ICD-10-CM | POA: Diagnosis not present

## 2022-12-27 DIAGNOSIS — G609 Hereditary and idiopathic neuropathy, unspecified: Secondary | ICD-10-CM | POA: Diagnosis not present

## 2022-12-27 DIAGNOSIS — I129 Hypertensive chronic kidney disease with stage 1 through stage 4 chronic kidney disease, or unspecified chronic kidney disease: Secondary | ICD-10-CM | POA: Diagnosis not present

## 2022-12-27 DIAGNOSIS — K219 Gastro-esophageal reflux disease without esophagitis: Secondary | ICD-10-CM | POA: Diagnosis not present

## 2022-12-27 DIAGNOSIS — G894 Chronic pain syndrome: Secondary | ICD-10-CM | POA: Diagnosis not present

## 2022-12-27 DIAGNOSIS — F1120 Opioid dependence, uncomplicated: Secondary | ICD-10-CM | POA: Diagnosis not present

## 2022-12-27 DIAGNOSIS — F331 Major depressive disorder, recurrent, moderate: Secondary | ICD-10-CM | POA: Diagnosis not present

## 2022-12-27 DIAGNOSIS — N390 Urinary tract infection, site not specified: Secondary | ICD-10-CM | POA: Diagnosis not present

## 2022-12-28 DIAGNOSIS — J449 Chronic obstructive pulmonary disease, unspecified: Secondary | ICD-10-CM | POA: Diagnosis not present

## 2022-12-28 DIAGNOSIS — N390 Urinary tract infection, site not specified: Secondary | ICD-10-CM | POA: Diagnosis not present

## 2022-12-28 DIAGNOSIS — M47812 Spondylosis without myelopathy or radiculopathy, cervical region: Secondary | ICD-10-CM | POA: Diagnosis not present

## 2022-12-28 DIAGNOSIS — G9341 Metabolic encephalopathy: Secondary | ICD-10-CM | POA: Diagnosis not present

## 2022-12-28 DIAGNOSIS — N189 Chronic kidney disease, unspecified: Secondary | ICD-10-CM | POA: Diagnosis not present

## 2022-12-28 DIAGNOSIS — I4819 Other persistent atrial fibrillation: Secondary | ICD-10-CM | POA: Diagnosis not present

## 2022-12-28 DIAGNOSIS — M47813 Spondylosis without myelopathy or radiculopathy, cervicothoracic region: Secondary | ICD-10-CM | POA: Diagnosis not present

## 2022-12-28 DIAGNOSIS — A0472 Enterocolitis due to Clostridium difficile, not specified as recurrent: Secondary | ICD-10-CM | POA: Diagnosis not present

## 2022-12-28 DIAGNOSIS — I129 Hypertensive chronic kidney disease with stage 1 through stage 4 chronic kidney disease, or unspecified chronic kidney disease: Secondary | ICD-10-CM | POA: Diagnosis not present

## 2022-12-29 DIAGNOSIS — I4819 Other persistent atrial fibrillation: Secondary | ICD-10-CM | POA: Diagnosis not present

## 2022-12-29 DIAGNOSIS — M47813 Spondylosis without myelopathy or radiculopathy, cervicothoracic region: Secondary | ICD-10-CM | POA: Diagnosis not present

## 2022-12-29 DIAGNOSIS — J449 Chronic obstructive pulmonary disease, unspecified: Secondary | ICD-10-CM | POA: Diagnosis not present

## 2022-12-29 DIAGNOSIS — N390 Urinary tract infection, site not specified: Secondary | ICD-10-CM | POA: Diagnosis not present

## 2022-12-29 DIAGNOSIS — M47812 Spondylosis without myelopathy or radiculopathy, cervical region: Secondary | ICD-10-CM | POA: Diagnosis not present

## 2022-12-29 DIAGNOSIS — A0472 Enterocolitis due to Clostridium difficile, not specified as recurrent: Secondary | ICD-10-CM | POA: Diagnosis not present

## 2022-12-29 DIAGNOSIS — I129 Hypertensive chronic kidney disease with stage 1 through stage 4 chronic kidney disease, or unspecified chronic kidney disease: Secondary | ICD-10-CM | POA: Diagnosis not present

## 2022-12-29 DIAGNOSIS — N189 Chronic kidney disease, unspecified: Secondary | ICD-10-CM | POA: Diagnosis not present

## 2022-12-29 DIAGNOSIS — G9341 Metabolic encephalopathy: Secondary | ICD-10-CM | POA: Diagnosis not present

## 2023-01-01 ENCOUNTER — Ambulatory Visit: Payer: Medicare HMO | Admitting: Nurse Practitioner

## 2023-01-01 ENCOUNTER — Encounter: Payer: Self-pay | Admitting: Nurse Practitioner

## 2023-01-02 ENCOUNTER — Ambulatory Visit
Admission: RE | Admit: 2023-01-02 | Discharge: 2023-01-02 | Disposition: A | Discharge status: Home / Self Care | Payer: Medicare HMO | Source: Ambulatory Visit | Attending: Internal Medicine | Admitting: Internal Medicine

## 2023-01-02 DIAGNOSIS — J439 Emphysema, unspecified: Secondary | ICD-10-CM | POA: Diagnosis not present

## 2023-01-02 DIAGNOSIS — R918 Other nonspecific abnormal finding of lung field: Secondary | ICD-10-CM | POA: Diagnosis not present

## 2023-01-02 DIAGNOSIS — R911 Solitary pulmonary nodule: Secondary | ICD-10-CM

## 2023-01-02 DIAGNOSIS — I700 Atherosclerosis of aorta: Secondary | ICD-10-CM | POA: Diagnosis not present

## 2023-01-03 DIAGNOSIS — M47813 Spondylosis without myelopathy or radiculopathy, cervicothoracic region: Secondary | ICD-10-CM | POA: Diagnosis not present

## 2023-01-03 DIAGNOSIS — I129 Hypertensive chronic kidney disease with stage 1 through stage 4 chronic kidney disease, or unspecified chronic kidney disease: Secondary | ICD-10-CM | POA: Diagnosis not present

## 2023-01-03 DIAGNOSIS — J449 Chronic obstructive pulmonary disease, unspecified: Secondary | ICD-10-CM | POA: Diagnosis not present

## 2023-01-03 DIAGNOSIS — M47812 Spondylosis without myelopathy or radiculopathy, cervical region: Secondary | ICD-10-CM | POA: Diagnosis not present

## 2023-01-03 DIAGNOSIS — A0472 Enterocolitis due to Clostridium difficile, not specified as recurrent: Secondary | ICD-10-CM | POA: Diagnosis not present

## 2023-01-03 DIAGNOSIS — N189 Chronic kidney disease, unspecified: Secondary | ICD-10-CM | POA: Diagnosis not present

## 2023-01-03 DIAGNOSIS — G9341 Metabolic encephalopathy: Secondary | ICD-10-CM | POA: Diagnosis not present

## 2023-01-03 DIAGNOSIS — N390 Urinary tract infection, site not specified: Secondary | ICD-10-CM | POA: Diagnosis not present

## 2023-01-03 DIAGNOSIS — I4819 Other persistent atrial fibrillation: Secondary | ICD-10-CM | POA: Diagnosis not present

## 2023-01-04 DIAGNOSIS — I129 Hypertensive chronic kidney disease with stage 1 through stage 4 chronic kidney disease, or unspecified chronic kidney disease: Secondary | ICD-10-CM | POA: Diagnosis not present

## 2023-01-04 DIAGNOSIS — G9341 Metabolic encephalopathy: Secondary | ICD-10-CM | POA: Diagnosis not present

## 2023-01-04 DIAGNOSIS — J449 Chronic obstructive pulmonary disease, unspecified: Secondary | ICD-10-CM | POA: Diagnosis not present

## 2023-01-04 DIAGNOSIS — I4819 Other persistent atrial fibrillation: Secondary | ICD-10-CM | POA: Diagnosis not present

## 2023-01-04 DIAGNOSIS — M47813 Spondylosis without myelopathy or radiculopathy, cervicothoracic region: Secondary | ICD-10-CM | POA: Diagnosis not present

## 2023-01-04 DIAGNOSIS — N390 Urinary tract infection, site not specified: Secondary | ICD-10-CM | POA: Diagnosis not present

## 2023-01-04 DIAGNOSIS — M47812 Spondylosis without myelopathy or radiculopathy, cervical region: Secondary | ICD-10-CM | POA: Diagnosis not present

## 2023-01-04 DIAGNOSIS — A0472 Enterocolitis due to Clostridium difficile, not specified as recurrent: Secondary | ICD-10-CM | POA: Diagnosis not present

## 2023-01-04 DIAGNOSIS — N189 Chronic kidney disease, unspecified: Secondary | ICD-10-CM | POA: Diagnosis not present

## 2023-01-08 ENCOUNTER — Other Ambulatory Visit (HOSPITAL_COMMUNITY): Payer: Self-pay | Admitting: Internal Medicine

## 2023-01-08 DIAGNOSIS — R911 Solitary pulmonary nodule: Secondary | ICD-10-CM

## 2023-01-09 ENCOUNTER — Ambulatory Visit: Payer: Medicare HMO | Attending: Nurse Practitioner | Admitting: Student

## 2023-01-09 ENCOUNTER — Encounter: Payer: Self-pay | Admitting: Student

## 2023-01-09 VITALS — BP 132/80 | HR 75 | Ht 64.0 in | Wt 123.6 lb

## 2023-01-09 DIAGNOSIS — E876 Hypokalemia: Secondary | ICD-10-CM

## 2023-01-09 DIAGNOSIS — I482 Chronic atrial fibrillation, unspecified: Secondary | ICD-10-CM | POA: Diagnosis not present

## 2023-01-09 DIAGNOSIS — I4821 Permanent atrial fibrillation: Secondary | ICD-10-CM | POA: Diagnosis not present

## 2023-01-09 DIAGNOSIS — I10 Essential (primary) hypertension: Secondary | ICD-10-CM | POA: Diagnosis not present

## 2023-01-09 DIAGNOSIS — I5181 Takotsubo syndrome: Secondary | ICD-10-CM | POA: Diagnosis not present

## 2023-01-09 DIAGNOSIS — I4819 Other persistent atrial fibrillation: Secondary | ICD-10-CM | POA: Diagnosis not present

## 2023-01-09 NOTE — Progress Notes (Signed)
Cardiology Clinic Note   Date: 01/09/2023 ID: Katie, [name] [birth year], MRN [id number]  Primary Cardiologist:  Peter Swaziland, MD  Patient Profile    [name] is a 77 y.o. female who presents to the clinic today for hospital follow up.   Past medical history significant for: Takotsubo cardiomyopathy. Echo 02/02/2019 (during hospitalization for aspiration pneumonia and respiratory failure): EF 20%.  Diffuse hypokinesis of left ventricle with mild basilar sparing.  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  Mild BAE. R/LHC 02/10/2019: Essentially normal coronary arteries.  Normal LV function, EF 55%.  Low filling pressures. Limited echo 02/10/2019: EF 50 to 55%. PAF. Onset November 2018. Hypertension. Hyperlipidemia. Lipid panel 07/18/2022: LDL 97, HDL 58, TG 68, total 169. Hypothyroidism. GERD. 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 CKD. Single kidney. Chronic pain.   History of Present Illness    [name] was first evaluated by Dr. Swaziland on 05/28/2013 for visual field cut consistent with acute embolic event at the request of Dr. Lavinia Sharps.  She was not seen again until November 2018 when she fractured her hip in a fall and was found to be in A-fib.  She is followed by Dr. Swaziland for the above outlined history.  Patient was last seen in the office by Dr. Swaziland on 05/08/2022 for follow-up.  She was doing well at that time and spironolactone was stopped.  Recent hospital admission from 12/07/2022 to 12/15/2022 for UTI and C. difficile.  During hospital course she developed acute metabolic encephalopathy thought to be secondary to overdose from fentanyl patch.  Worsening mental status was improved with Narcan drip.  She was treated with Rocephin for recurrent UTI and vancomycin for C. difficile.  Today, patient is accompanied by her husband. She is feeling much improved since hospital discharge. She denies chest pain, tightness or pressure. Shortness of breath is at baseline. She  continues to have urinary issues and is pending a visit with urology. She has occasional hematuria but no other bleeding concerns. She was hypokalemic in the hospital. She has not had repeat labs since discharge.     ROS: All other systems reviewed and are otherwise negative except as noted in History of Present Illness.  Studies Reviewed    ECG personally reviewed by me today: Afib with PVCs, rate 75 bpm.  No significant changes from 06/19/2022.  Risk Assessment/Calculations     CHA2DS2-VASc Score = 8   This indicates a 10.8% annual risk of stroke. The patient's score is based upon: CHF History: 1 HTN History: 1 Diabetes History: 0 Stroke History: 2 (occular CVA in 2012, didn't start on anticoag until 2018 for afib dx) Vascular Disease History: 1 Age Score: 2 Gender Score: 1             Physical Exam    VS:  BP 132/80   Pulse 75   Ht 5\' 4"  (1.626 m)   Wt 123 lb 9.6 oz (56.1 kg)   SpO2 98%   BMI 21.22 kg/m  , BMI Body mass index is 21.22 kg/m.  GEN: Well nourished, well developed, in no acute distress. Neck: No JVD or carotid bruits. Cardiac: Irregular rhythm, controlled rate.  No murmurs. No rubs or gallops.   Respiratory:  Respirations regular and unlabored. Clear to auscultation without rales, wheezing or rhonchi. GI: Soft, nontender, nondistended. Extremities: Radials/DP/PT 2+ and equal bilaterally. No clubbing or cyanosis. No edema.  Skin: Warm and dry, no rash. Neuro: Strength intact.  Assessment & Plan    Permanent A-fib.  Onset November 2018.  Patient has no cardiac awareness of A-fib and denies palpitations.  Denies spontaneous bleeding concerns.  Continue bisoprolol and Eliquis. Appropriate Eliquis dose. History of Takotsubo cardiomyopathy.  Occurred in the setting of aspiration pneumonia and respiratory failure June 2020.  Echo 02/02/2019 showed EF 20%.  Follow-up echo 02/10/2019 showed recovery of EF.  Patient denies lower extremity edema, increased  shortness of breath, orthopnea or PND.  Euvolemic and well compensated on exam. Hypertension. BP today 132/80. Patient denies headaches, dizziness or vision changes. Continue amlodipine, bisoprolol. Hypokalemia. Patient's potassium was decreased in the hospital in the setting of c.diff. Will repeat BMP today.   Disposition: BMP today. Return in 6 months or sooner as needed.          Signed, Etta Grandchild. Betrice Wanat, DNP, NP-C

## 2023-01-09 NOTE — Patient Instructions (Signed)
Medication Instructions:  Your physician recommends that you continue on your current medications as directed. Please refer to the Current Medication list given to you today.  *If you need a refill on your cardiac medications before your next appointment, please call your pharmacy*   Lab Work: Your physician recommends that you have the following lab drawn: BMET  If you have labs (blood work) drawn today and your tests are completely normal, you will receive your results only by: MyChart Message (if you have MyChart) OR A paper copy in the mail If you have any lab test that is abnormal or we need to change your treatment, we will call you to review the results.   Testing/Procedures: NONE   Follow-Up: At [facility], you and your health needs are our priority.  As part of our continuing mission to provide you with exceptional heart care, we have created designated Provider Care Teams.  These Care Teams include your primary Cardiologist (physician) and Advanced Practice Providers (APPs -  Physician Assistants and Nurse Practitioners) who all work together to provide you with the care you need, when you need it.  We recommend signing up for the patient portal called "MyChart".  Sign up information is provided on this After Visit Summary.  MyChart is used to connect with patients for Virtual Visits (Telemedicine).  Patients are able to view lab/test results, encounter notes, upcoming appointments, etc.  Non-urgent messages can be sent to your provider as well.   To learn more about what you can do with MyChart, go to [email or web address].    Your next appointment:   6 month(s)  Provider:   Peter Swaziland, MD

## 2023-01-10 DIAGNOSIS — N189 Chronic kidney disease, unspecified: Secondary | ICD-10-CM | POA: Diagnosis not present

## 2023-01-10 DIAGNOSIS — M47813 Spondylosis without myelopathy or radiculopathy, cervicothoracic region: Secondary | ICD-10-CM | POA: Diagnosis not present

## 2023-01-10 DIAGNOSIS — G9341 Metabolic encephalopathy: Secondary | ICD-10-CM | POA: Diagnosis not present

## 2023-01-10 DIAGNOSIS — I4819 Other persistent atrial fibrillation: Secondary | ICD-10-CM | POA: Diagnosis not present

## 2023-01-10 DIAGNOSIS — M47812 Spondylosis without myelopathy or radiculopathy, cervical region: Secondary | ICD-10-CM | POA: Diagnosis not present

## 2023-01-10 DIAGNOSIS — A0472 Enterocolitis due to Clostridium difficile, not specified as recurrent: Secondary | ICD-10-CM | POA: Diagnosis not present

## 2023-01-10 DIAGNOSIS — I129 Hypertensive chronic kidney disease with stage 1 through stage 4 chronic kidney disease, or unspecified chronic kidney disease: Secondary | ICD-10-CM | POA: Diagnosis not present

## 2023-01-10 DIAGNOSIS — N390 Urinary tract infection, site not specified: Secondary | ICD-10-CM | POA: Diagnosis not present

## 2023-01-10 DIAGNOSIS — J449 Chronic obstructive pulmonary disease, unspecified: Secondary | ICD-10-CM | POA: Diagnosis not present

## 2023-01-10 LAB — BASIC METABOLIC PANEL
BUN/Creatinine Ratio: 18 (ref 12–28)
BUN: 19 mg/dL (ref 8–27)
CO2: 27 mmol/L (ref 20–29)
Calcium: 10 mg/dL (ref 8.7–10.3)
Chloride: 103 mmol/L (ref 96–106)
Creatinine, Ser: 1.05 mg/dL — ABNORMAL HIGH (ref 0.57–1.00)
Glucose: 81 mg/dL (ref 70–99)
Potassium: 5.2 mmol/L (ref 3.5–5.2)
Sodium: 142 mmol/L (ref 134–144)
eGFR: 55 mL/min/{1.73_m2} — ABNORMAL LOW (ref >59)

## 2023-01-17 DIAGNOSIS — I4819 Other persistent atrial fibrillation: Secondary | ICD-10-CM | POA: Diagnosis not present

## 2023-01-17 DIAGNOSIS — A0472 Enterocolitis due to Clostridium difficile, not specified as recurrent: Secondary | ICD-10-CM | POA: Diagnosis not present

## 2023-01-17 DIAGNOSIS — N390 Urinary tract infection, site not specified: Secondary | ICD-10-CM | POA: Diagnosis not present

## 2023-01-17 DIAGNOSIS — G9341 Metabolic encephalopathy: Secondary | ICD-10-CM | POA: Diagnosis not present

## 2023-01-17 DIAGNOSIS — N189 Chronic kidney disease, unspecified: Secondary | ICD-10-CM | POA: Diagnosis not present

## 2023-01-17 DIAGNOSIS — M47812 Spondylosis without myelopathy or radiculopathy, cervical region: Secondary | ICD-10-CM | POA: Diagnosis not present

## 2023-01-17 DIAGNOSIS — J449 Chronic obstructive pulmonary disease, unspecified: Secondary | ICD-10-CM | POA: Diagnosis not present

## 2023-01-17 DIAGNOSIS — I129 Hypertensive chronic kidney disease with stage 1 through stage 4 chronic kidney disease, or unspecified chronic kidney disease: Secondary | ICD-10-CM | POA: Diagnosis not present

## 2023-01-17 DIAGNOSIS — M47813 Spondylosis without myelopathy or radiculopathy, cervicothoracic region: Secondary | ICD-10-CM | POA: Diagnosis not present

## 2023-01-19 DIAGNOSIS — N3021 Other chronic cystitis with hematuria: Secondary | ICD-10-CM | POA: Diagnosis not present

## 2023-01-19 DIAGNOSIS — M170 Bilateral primary osteoarthritis of knee: Secondary | ICD-10-CM | POA: Diagnosis not present

## 2023-01-19 DIAGNOSIS — R310 Gross hematuria: Secondary | ICD-10-CM | POA: Diagnosis not present

## 2023-01-23 DIAGNOSIS — I4819 Other persistent atrial fibrillation: Secondary | ICD-10-CM | POA: Diagnosis not present

## 2023-01-23 DIAGNOSIS — A0472 Enterocolitis due to Clostridium difficile, not specified as recurrent: Secondary | ICD-10-CM | POA: Diagnosis not present

## 2023-01-23 DIAGNOSIS — M47813 Spondylosis without myelopathy or radiculopathy, cervicothoracic region: Secondary | ICD-10-CM | POA: Diagnosis not present

## 2023-01-23 DIAGNOSIS — J449 Chronic obstructive pulmonary disease, unspecified: Secondary | ICD-10-CM | POA: Diagnosis not present

## 2023-01-23 DIAGNOSIS — G9341 Metabolic encephalopathy: Secondary | ICD-10-CM | POA: Diagnosis not present

## 2023-01-23 DIAGNOSIS — N390 Urinary tract infection, site not specified: Secondary | ICD-10-CM | POA: Diagnosis not present

## 2023-01-23 DIAGNOSIS — N189 Chronic kidney disease, unspecified: Secondary | ICD-10-CM | POA: Diagnosis not present

## 2023-01-23 DIAGNOSIS — M47812 Spondylosis without myelopathy or radiculopathy, cervical region: Secondary | ICD-10-CM | POA: Diagnosis not present

## 2023-01-23 DIAGNOSIS — I129 Hypertensive chronic kidney disease with stage 1 through stage 4 chronic kidney disease, or unspecified chronic kidney disease: Secondary | ICD-10-CM | POA: Diagnosis not present

## 2023-01-25 ENCOUNTER — Telehealth: Payer: Self-pay | Admitting: Cardiology

## 2023-01-25 NOTE — Telephone Encounter (Signed)
Patient states she has a really bad UTI. She stated we took her off her Eliquis for three days starting last Sunday. She stated she has started back taking her Eliquis and now she have blood in her urine again and its not a little bit. She states she will be starting a new ABT for her UTI. She would like to know what do you suggest because she is afraid to stop her Eliquis again.

## 2023-01-25 NOTE — Telephone Encounter (Signed)
Patient calling to speak to the nurse by bleeding she is having with a UTI. And want to discuss her Elquis. Please advise

## 2023-01-26 NOTE — Telephone Encounter (Signed)
Spoke to patient Dr.Jordan's advice given. 

## 2023-01-29 ENCOUNTER — Ambulatory Visit (HOSPITAL_COMMUNITY)
Admission: RE | Admit: 2023-01-29 | Discharge: 2023-01-29 | Disposition: A | Discharge status: Home / Self Care | Payer: Medicare HMO | Source: Ambulatory Visit | Attending: Internal Medicine | Admitting: Internal Medicine

## 2023-01-29 DIAGNOSIS — R197 Diarrhea, unspecified: Secondary | ICD-10-CM | POA: Diagnosis not present

## 2023-01-29 DIAGNOSIS — R911 Solitary pulmonary nodule: Secondary | ICD-10-CM | POA: Insufficient documentation

## 2023-01-29 LAB — GLUCOSE, CAPILLARY: Glucose-Capillary: 92 mg/dL (ref 70–99)

## 2023-01-29 MED ORDER — FLUDEOXYGLUCOSE F - 18 (FDG) INJECTION
7.0000 | Freq: Once | INTRAVENOUS | Status: AC | PRN
  Administered 2023-01-29: 6.12 via INTRAVENOUS

## 2023-01-31 DIAGNOSIS — M170 Bilateral primary osteoarthritis of knee: Secondary | ICD-10-CM | POA: Diagnosis not present

## 2023-02-05 DIAGNOSIS — R3914 Feeling of incomplete bladder emptying: Secondary | ICD-10-CM | POA: Diagnosis not present

## 2023-02-05 DIAGNOSIS — N3021 Other chronic cystitis with hematuria: Secondary | ICD-10-CM | POA: Diagnosis not present

## 2023-02-06 DIAGNOSIS — N3000 Acute cystitis without hematuria: Secondary | ICD-10-CM | POA: Diagnosis not present

## 2023-02-15 ENCOUNTER — Institutional Professional Consult (permissible substitution): Payer: Medicare HMO | Admitting: Thoracic Surgery (Cardiothoracic Vascular Surgery)

## 2023-02-15 ENCOUNTER — Encounter: Payer: Self-pay | Admitting: Thoracic Surgery (Cardiothoracic Vascular Surgery)

## 2023-02-15 VITALS — BP 118/76 | HR 90 | Resp 20 | Ht 64.0 in | Wt 123.0 lb

## 2023-02-15 DIAGNOSIS — G609 Hereditary and idiopathic neuropathy, unspecified: Secondary | ICD-10-CM | POA: Diagnosis not present

## 2023-02-15 DIAGNOSIS — R911 Solitary pulmonary nodule: Secondary | ICD-10-CM | POA: Insufficient documentation

## 2023-02-15 DIAGNOSIS — C3490 Malignant neoplasm of unspecified part of unspecified bronchus or lung: Secondary | ICD-10-CM | POA: Diagnosis not present

## 2023-02-15 DIAGNOSIS — I4891 Unspecified atrial fibrillation: Secondary | ICD-10-CM | POA: Diagnosis not present

## 2023-02-15 DIAGNOSIS — F1120 Opioid dependence, uncomplicated: Secondary | ICD-10-CM | POA: Diagnosis not present

## 2023-02-15 DIAGNOSIS — G894 Chronic pain syndrome: Secondary | ICD-10-CM | POA: Diagnosis not present

## 2023-02-15 DIAGNOSIS — I129 Hypertensive chronic kidney disease with stage 1 through stage 4 chronic kidney disease, or unspecified chronic kidney disease: Secondary | ICD-10-CM | POA: Diagnosis not present

## 2023-02-15 DIAGNOSIS — N1831 Chronic kidney disease, stage 3a: Secondary | ICD-10-CM | POA: Diagnosis not present

## 2023-02-15 DIAGNOSIS — F331 Major depressive disorder, recurrent, moderate: Secondary | ICD-10-CM | POA: Diagnosis not present
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
## 2023-02-16 ENCOUNTER — Encounter: Payer: Self-pay | Admitting: *Deleted

## 2023-02-16 ENCOUNTER — Other Ambulatory Visit: Payer: Self-pay | Admitting: *Deleted

## 2023-02-16 DIAGNOSIS — R911 Solitary pulmonary nodule: Secondary | ICD-10-CM

## 2023-02-19 DIAGNOSIS — N3021 Other chronic cystitis with hematuria: Secondary | ICD-10-CM | POA: Diagnosis not present

## 2023-02-19 DIAGNOSIS — R3914 Feeling of incomplete bladder emptying: Secondary | ICD-10-CM | POA: Diagnosis not present

## 2023-02-19 DIAGNOSIS — R8289 Other abnormal findings on cytological and histological examination of urine: Secondary | ICD-10-CM | POA: Diagnosis not present

## 2023-02-19 NOTE — Pre-Procedure Instructions (Signed)
Surgical Instructions    Your procedure is scheduled on February 21, 2023.  Report to [facility] Main Entrance "A" at 6:30 A.M., then check in with the Admitting office.  Call this number if you have problems the morning of surgery:  [phone]  If you have any questions prior to your surgery date call [phone]: Open Monday-Friday 8am-4pm If you experience any cold or flu symptoms such as cough, fever, chills, shortness of breath, etc. between now and your scheduled surgery, please notify us at the above number.     Remember:  Do not eat or drink after midnight the night before your surgery     Take these medicines the morning of surgery with A SIP OF WATER:  acetaminophen (TYLENOL)   amLODipine (NORVASC)   atorvastatin (LIPITOR)   buPROPion (WELLBUTRIN)   gabapentin (NEURONTIN)   levothyroxine (SYNTHROID)   loratadine (CLARITIN)   omeprazole (PRILOSEC OTC)   ondansetron (ZOFRAN)       Follow your surgeon's instructions on when to stop apixaban (ELIQUIS).  If no instructions were given by your surgeon then you will need to call the office to get those instructions.     As of today, STOP taking any Aspirin (unless otherwise instructed by your surgeon) Aleve, Naproxen, Ibuprofen, Motrin, Advil, Goody's, BC's, all herbal medications, fish oil, and all vitamins.                     Do NOT Smoke (Tobacco/Vaping) for 24 hours prior to your procedure.  If you use a CPAP at night, you may bring your mask/headgear for your overnight stay.   Contacts, glasses, piercing's, hearing aid's, dentures or partials may not be worn into surgery, please bring cases for these belongings.    For patients admitted to the hospital, discharge time will be determined by your treatment team.   Patients discharged the day of surgery will not be allowed to drive home, and someone needs to stay with them for 24 hours.  SURGICAL WAITING ROOM VISITATION Patients having surgery or a procedure may have  no more than 2 support people in the waiting area - these visitors may rotate.   Children under the age of 60 must have an adult with them who is not the patient. If the patient needs to stay at the hospital during part of their recovery, the visitor guidelines for inpatient rooms apply. Pre-op nurse will coordinate an appropriate time for 1 support person to accompany patient in pre-op.  This support person may not rotate.   Please refer to the [facility] website for the visitor guidelines for Inpatients (after your surgery is over and you are in a regular room).    Special instructions:   [facility]- Preparing For Surgery  Before surgery, you can play an important role. Because skin is not sterile, your skin needs to be as free of germs as possible. You can reduce the number of germs on your skin by washing with CHG (chlorahexidine gluconate) Soap before surgery.  CHG is an antiseptic cleaner which kills germs and bonds with the skin to continue killing germs even after washing.    Oral Hygiene is also important to reduce your risk of infection.  Remember - BRUSH YOUR TEETH THE MORNING OF SURGERY WITH YOUR REGULAR TOOTHPASTE  Please do not use if you have an allergy to CHG or antibacterial soaps. If your skin becomes reddened/irritated stop using the CHG.  Do not shave (including legs and underarms) for at  least 48 hours prior to first CHG shower. It is OK to shave your face.  Please follow these instructions carefully.   Shower the NIGHT BEFORE SURGERY and the MORNING OF SURGERY  If you chose to wash your hair, wash your hair first as usual with your normal shampoo.  After you shampoo, rinse your hair and body thoroughly to remove the shampoo.  Use CHG Soap as you would any other liquid soap. You can apply CHG directly to the skin and wash gently with a scrungie or a clean washcloth.   Apply the CHG Soap to your body ONLY FROM THE NECK DOWN.  Do not use on open wounds or open sores.  Avoid contact with your eyes, ears, mouth and genitals (private parts). Wash Face and genitals (private parts)  with your normal soap.   Wash thoroughly, paying special attention to the area where your surgery will be performed.  Thoroughly rinse your body with warm water from the neck down.  DO NOT shower/wash with your normal soap after using and rinsing off the CHG Soap.  Pat yourself dry with a CLEAN TOWEL.  Wear CLEAN PAJAMAS to bed the night before surgery  Place CLEAN SHEETS on your bed the night before your surgery  DO NOT SLEEP WITH PETS.   Day of Surgery: Take a shower with CHG soap. Do not wear jewelry or makeup Do not wear lotions, powders, perfumes/colognes, or deodorant. Do not shave 48 hours prior to surgery.  Men may shave face and neck. Do not bring valuables to the hospital.  [facility] is not responsible for any belongings or valuables. Do not wear nail polish, gel polish, artificial nails, or any other type of covering on natural nails (fingers and toes) If you have artificial nails or gel coating that need to be removed by a nail salon, please have this removed prior to surgery. Artificial nails or gel coating may interfere with anesthesia's ability to adequately monitor your vital signs.  Wear Clean/Comfortable clothing the morning of surgery Remember to brush your teeth WITH YOUR REGULAR TOOTHPASTE.   Please read over the following fact sheets that you were given.    If you received a COVID test during your pre-op visit  it is requested that you wear a mask when out in public, stay away from anyone that may not be feeling well and notify your surgeon if you develop symptoms. If you have been in contact with anyone that has tested positive in the last 10 days please notify you surgeon.

## 2023-02-20 ENCOUNTER — Encounter (HOSPITAL_COMMUNITY)
Admission: RE | Admit: 2023-02-20 | Discharge: 2023-02-20 | Disposition: A | Discharge status: Home / Self Care | Payer: Medicare HMO | Source: Ambulatory Visit | Attending: Thoracic Surgery (Cardiothoracic Vascular Surgery) | Admitting: Thoracic Surgery (Cardiothoracic Vascular Surgery)

## 2023-02-20 ENCOUNTER — Other Ambulatory Visit: Payer: Self-pay

## 2023-02-20 ENCOUNTER — Encounter (HOSPITAL_COMMUNITY): Payer: Self-pay

## 2023-02-20 ENCOUNTER — Ambulatory Visit (HOSPITAL_COMMUNITY)
Admission: RE | Admit: 2023-02-20 | Discharge: 2023-02-20 | Disposition: A | Discharge status: Home / Self Care | Payer: Medicare HMO | Source: Ambulatory Visit | Attending: Thoracic Surgery (Cardiothoracic Vascular Surgery) | Admitting: Thoracic Surgery (Cardiothoracic Vascular Surgery)

## 2023-02-20 DIAGNOSIS — I10 Essential (primary) hypertension: Secondary | ICD-10-CM | POA: Insufficient documentation

## 2023-02-20 DIAGNOSIS — Z87891 Personal history of nicotine dependence: Secondary | ICD-10-CM | POA: Diagnosis not present

## 2023-02-20 DIAGNOSIS — I2510 Atherosclerotic heart disease of native coronary artery without angina pectoris: Secondary | ICD-10-CM | POA: Diagnosis not present

## 2023-02-20 DIAGNOSIS — R911 Solitary pulmonary nodule: Secondary | ICD-10-CM | POA: Diagnosis not present

## 2023-02-20 DIAGNOSIS — I4819 Other persistent atrial fibrillation: Secondary | ICD-10-CM | POA: Diagnosis not present

## 2023-02-20 DIAGNOSIS — I700 Atherosclerosis of aorta: Secondary | ICD-10-CM | POA: Diagnosis not present

## 2023-02-20 DIAGNOSIS — R918 Other nonspecific abnormal finding of lung field: Secondary | ICD-10-CM | POA: Insufficient documentation

## 2023-02-20 DIAGNOSIS — J449 Chronic obstructive pulmonary disease, unspecified: Secondary | ICD-10-CM | POA: Diagnosis not present

## 2023-02-20 DIAGNOSIS — Z8673 Personal history of transient ischemic attack (TIA), and cerebral infarction without residual deficits: Secondary | ICD-10-CM | POA: Diagnosis not present

## 2023-02-20 DIAGNOSIS — Z905 Acquired absence of kidney: Secondary | ICD-10-CM | POA: Diagnosis not present

## 2023-02-20 DIAGNOSIS — K219 Gastro-esophageal reflux disease without esophagitis: Secondary | ICD-10-CM | POA: Insufficient documentation

## 2023-02-20 DIAGNOSIS — I722 Aneurysm of renal artery: Secondary | ICD-10-CM | POA: Insufficient documentation

## 2023-02-20 DIAGNOSIS — E039 Hypothyroidism, unspecified: Secondary | ICD-10-CM | POA: Diagnosis not present

## 2023-02-20 DIAGNOSIS — Z01812 Encounter for preprocedural laboratory examination: Secondary | ICD-10-CM | POA: Diagnosis not present

## 2023-02-20 DIAGNOSIS — Z01818 Encounter for other preprocedural examination: Secondary | ICD-10-CM | POA: Diagnosis not present

## 2023-02-20 DIAGNOSIS — I359 Nonrheumatic aortic valve disorder, unspecified: Secondary | ICD-10-CM | POA: Diagnosis not present

## 2023-02-20 HISTORY — DX: Cardiac arrhythmia, unspecified: I49.9

## 2023-02-20 LAB — PROTIME-INR
INR: 1.1 (ref 0.8–1.2)
Prothrombin Time: 14.9 seconds (ref 11.4–15.2)

## 2023-02-20 LAB — COMPREHENSIVE METABOLIC PANEL
ALT: 24 U/L (ref 0–44)
AST: 21 U/L (ref 15–41)
Albumin: 3.9 g/dL (ref 3.5–5.0)
Alkaline Phosphatase: 59 U/L (ref 38–126)
Anion gap: 9 (ref 5–15)
BUN: 23 mg/dL (ref 8–23)
CO2: 26 mmol/L (ref 22–32)
Calcium: 9.7 mg/dL (ref 8.9–10.3)
Chloride: 102 mmol/L (ref 98–111)
Creatinine, Ser: 1.36 mg/dL — ABNORMAL HIGH (ref 0.44–1.00)
GFR, Estimated: 40 mL/min — ABNORMAL LOW (ref >60)
Glucose, Bld: 106 mg/dL — ABNORMAL HIGH (ref 70–99)
Potassium: 4.9 mmol/L (ref 3.5–5.1)
Sodium: 137 mmol/L (ref 135–145)
Total Bilirubin: 0.8 mg/dL (ref 0.3–1.2)
Total Protein: 6.5 g/dL (ref 6.5–8.1)

## 2023-02-20 LAB — CBC
HCT: 42.1 % (ref 36.0–46.0)
Hemoglobin: 13.4 g/dL (ref 12.0–15.0)
MCH: 31.5 pg (ref 26.0–34.0)
MCHC: 31.8 g/dL (ref 30.0–36.0)
MCV: 99.1 fL (ref 80.0–100.0)
Platelets: 267 10*3/uL (ref 150–400)
RBC: 4.25 MIL/uL (ref 3.87–5.11)
RDW: 12.9 % (ref 11.5–15.5)
WBC: 4.7 10*3/uL (ref 4.0–10.5)
nRBC: 0 % (ref 0.0–0.2)

## 2023-02-20 LAB — APTT: aPTT: 30 seconds (ref 24–36)

## 2023-02-20 NOTE — Progress Notes (Signed)
Anesthesia Chart Review:  Patient is a 77 year old female with pertinent history including HTN, hypothyroid, GERD, hiatal hernia, former smoker with associated COPD, CVA 2012, CKD s/p right nephrectomy, persistent A-fib, history of Takotsubo cardiomyopathy with recovered EF.  She has a right middle lobe lung nodule-initially discovered on the chest x-ray.  Then confirmed with CT.  Mildly hypermetabolic on PET/CT.  She was seen by Dr. Dorris Fetch and recommended undergo navigational bronchoscopy.  She was advised to hold Eliquis 2 days prior to procedure.  She reports last dose of Eliquis 02/19/2023.  She was last seen by cardiology APP Carlos Levering, NP on 01/09/2023.  Noted to be stable at that time, no changes to management, 8-month follow-up recommended.  Preop labs reviewed, creatinine mildly elevated 1.36, otherwise WNL.  EKG 01/09/2023: Atrial fibrillation with PVCs or aberrantly conducted complexes.  Rate 75.  CT chest 01/02/2023: IMPRESSION: 1. Irregular solid 2.0 cm posterior right middle lobe pulmonary nodule, for which no previous chest CT is available for comparison at this time. Recommend PET-CT at this time for further characterization. 2. A few additional smaller scattered indistinct bilateral pulmonary nodules measuring up to 0.8 cm in the left upper lobe, generally below PET resolution, for which follow-up chest CT is advised. 3. No thoracic adenopathy. 4. Dilated main pulmonary artery, suggesting pulmonary arterial hypertension. 5. One vessel coronary atherosclerosis. 6. 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 7.  Aortic Atherosclerosis (ICD10-I70.0).  Right/left heart cath 02/10/2019: Assessment: 1. Essentially normal coronary arteries 2. Normal LV function EF 55% 3. Low filling pressures 4. 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 Filling pressures and cardiac output are low. Will stop lasix. With small venous wire perforation will hold heparin overnight and start Eliquis in am. Check CBC in am.  TTE 02/10/2019:  1. The left ventricle has low normal systolic function, with an ejection  fraction of 50-55%. The cavity size was normal. Left ventricular diastolic  function could not be evaluated secondary to atrial fibrillation.   2. The right ventricle has normal systolc function. The cavity was  normal.   3. The mitral valve is grossly normal. Mild thickening of the mitral  valve leaflet.   4. The tricuspid valve was grossly normal.   5. The aortic valve is tricuspid Mild thickening of the aortic valve. No  stenosis of the aortic valve.   6. Low normal LV systolic function; moderate biatrial enlargement.    [name] [facility]/Anesthesiology Phone [phone] 02/20/2023 12:37 PM

## 2023-02-20 NOTE — Progress Notes (Signed)
PCP - Dr. Chilton Greathouse Cardiologist - Dr. Peter Swaziland  PPM/ICD - Denies Device Orders - n/a Rep Notified - n/a  Chest x-ray - 02/20/2023 EKG - 01/09/2023 Stress Test - Per pt, many years ago ECHO - 02/10/2019 Cardiac Cath - 02/10/2019  Sleep Study - Denies CPAP - n/a  No DM  Last dose of GLP1 agonist- n/a GLP1 instructions: n/a  Blood Thinner Instructions: Per surgeons instructions, patients last dose of Eliquis was morning of July 1st Aspirin Instructions: n/a  NPO after midnight  COVID TEST- n/a   Anesthesia review: Yes. Hx of A.Fib, HTN, Optical CVA. Recent office visit with Dr. Swaziland on 01/09/2023 with 6 month f/u. Pt also had a recent hospital admission 4/18-4/26 for UTI and C.Diff.    Patient denies shortness of breath, fever, cough and chest pain at PAT appointment. Pt denies any respiratory illness/infection in the last two months.   All instructions explained to the patient, with a verbal understanding of the material. Patient agrees to go over the instructions while at home for a better understanding. Patient also instructed to self quarantine after being tested for COVID-19. The opportunity to ask questions was provided.

## 2023-02-20 NOTE — Anesthesia Preprocedure Evaluation (Signed)
Anesthesia Evaluation  Patient identified by MRN, date of birth, ID band Patient awake    Reviewed: Allergy & Precautions, H&P , NPO status , Patient's Chart, lab work & pertinent test results  Airway Mallampati: II  TM Distance: >3 FB Neck ROM: Full    Dental no notable dental hx.    Pulmonary COPD, former smoker   Pulmonary exam normal breath sounds clear to auscultation       Cardiovascular hypertension, + dysrhythmias Atrial Fibrillation  Rhythm:Irregular Rate:Normal     Neuro/Psych   Anxiety Depression    CVA  negative psych ROS   GI/Hepatic Neg liver ROS,GERD  ,,  Endo/Other  Hypothyroidism    Renal/GU negative Renal ROS  negative genitourinary   Musculoskeletal negative musculoskeletal ROS (+)    Abdominal   Peds negative pediatric ROS (+)  Hematology negative hematology ROS (+)   Anesthesia Other Findings   Reproductive/Obstetrics negative OB ROS                             Anesthesia Physical Anesthesia Plan  ASA: 3  Anesthesia Plan: General   Post-op Pain Management: Minimal or no pain anticipated   Induction: Intravenous  PONV Risk Score and Plan: 3 and Ondansetron, Dexamethasone and Treatment may vary due to age or medical condition  Airway Management Planned: Oral ETT  Additional Equipment:   Intra-op Plan:   Post-operative Plan: Extubation in OR  Informed Consent: I have reviewed the patients History and Physical, chart, labs and discussed the procedure including the risks, benefits and alternatives for the proposed anesthesia with the patient or authorized representative who has indicated his/her understanding and acceptance.     Dental advisory given  Plan Discussed with: CRNA and Surgeon  Anesthesia Plan Comments: (PAT note by Antionette Poles, PA-C:  Patient is a 77 year old female with pertinent history including HTN, hypothyroid, GERD, hiatal hernia,  former smoker with associated COPD, CVA 2012, CKD s/p right nephrectomy, persistent A-fib, history of Takotsubo cardiomyopathy with recovered EF.  She has a right middle lobe lung nodule-initially discovered on the chest x-ray.  Then confirmed with CT.  Mildly hypermetabolic on PET/CT.  She was seen by Dr. Dorris Fetch and recommended undergo navigational bronchoscopy.  She was advised to hold Eliquis 2 days prior to procedure.  She reports last dose of Eliquis 02/19/2023.  She was last seen by cardiology APP Carlos Levering, NP on 01/09/2023.  Noted to be stable at that time, no changes to management, 78-month follow-up recommended.  Preop labs reviewed, creatinine mildly elevated 1.36, otherwise WNL.  EKG 01/09/2023: Atrial fibrillation with PVCs or aberrantly conducted complexes.  Rate 75.  CT chest 01/02/2023: IMPRESSION: 1. Irregular solid 2.0 cm posterior right middle lobe pulmonary nodule, for which no previous chest CT is available for comparison at this time. Recommend PET-CT at this time for further characterization. 2. A few additional smaller scattered indistinct bilateral pulmonary nodules measuring up to 0.8 cm in the left upper lobe, generally below PET resolution, for which follow-up chest CT is advised. 3. No thoracic adenopathy. 4. Dilated main pulmonary artery, suggesting pulmonary arterial hypertension. 5. One vessel coronary atherosclerosis. 6. 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 7.  Aortic Atherosclerosis (ICD10-I70.0).  Right/left heart cath 02/10/2019: Assessment: 1. Essentially normal coronary arteries 2. Normal LV function EF 55% 3. Low filling pressures 4. 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 Filling  pressures and cardiac output are low. Will stop lasix. With small venous wire perforation will hold heparin overnight and start Eliquis in am. Check CBC in am.  TTE 02/10/2019: 1. The left ventricle has low normal systolic function, with an ejection  fraction of 50-55%. The cavity size was normal. Left ventricular diastolic  function could not be evaluated secondary to atrial fibrillation.  2. The right ventricle has normal systolc function. The cavity was  normal.  3. The mitral valve is grossly normal. Mild thickening of the mitral  valve leaflet.  4. The tricuspid valve was grossly normal.  5. The aortic valve is tricuspid Mild thickening of the aortic valve. No  stenosis of the aortic valve.  6. Low normal LV systolic function; moderate biatrial enlargement.    )        Anesthesia Quick Evaluation

## 2023-02-21 ENCOUNTER — Other Ambulatory Visit: Payer: Self-pay

## 2023-02-21 ENCOUNTER — Ambulatory Visit (HOSPITAL_COMMUNITY): Payer: Medicare HMO | Admitting: Physician Assistant

## 2023-02-21 ENCOUNTER — Ambulatory Visit (HOSPITAL_BASED_OUTPATIENT_CLINIC_OR_DEPARTMENT_OTHER): Payer: Medicare HMO | Admitting: Anesthesiology

## 2023-02-21 ENCOUNTER — Ambulatory Visit (HOSPITAL_COMMUNITY): Payer: Medicare HMO

## 2023-02-21 ENCOUNTER — Ambulatory Visit (HOSPITAL_COMMUNITY)
Admission: RE | Admit: 2023-02-21 | Discharge: 2023-02-21 | Disposition: A | Discharge status: Home / Self Care | Payer: Medicare HMO | Attending: Thoracic Surgery (Cardiothoracic Vascular Surgery) | Admitting: Thoracic Surgery (Cardiothoracic Vascular Surgery)

## 2023-02-21 ENCOUNTER — Encounter (HOSPITAL_COMMUNITY): Payer: Self-pay | Admitting: Thoracic Surgery (Cardiothoracic Vascular Surgery)

## 2023-02-21 ENCOUNTER — Encounter (HOSPITAL_COMMUNITY)
Admission: RE | Disposition: A | Discharge status: Home / Self Care | Payer: Self-pay | Source: Home / Self Care | Attending: Thoracic Surgery (Cardiothoracic Vascular Surgery)

## 2023-02-21 DIAGNOSIS — I10 Essential (primary) hypertension: Secondary | ICD-10-CM | POA: Diagnosis not present

## 2023-02-21 DIAGNOSIS — Z87891 Personal history of nicotine dependence: Secondary | ICD-10-CM | POA: Insufficient documentation

## 2023-02-21 DIAGNOSIS — I2510 Atherosclerotic heart disease of native coronary artery without angina pectoris: Secondary | ICD-10-CM | POA: Insufficient documentation

## 2023-02-21 DIAGNOSIS — M1990 Unspecified osteoarthritis, unspecified site: Secondary | ICD-10-CM | POA: Insufficient documentation

## 2023-02-21 DIAGNOSIS — K219 Gastro-esophageal reflux disease without esophagitis: Secondary | ICD-10-CM | POA: Insufficient documentation

## 2023-02-21 DIAGNOSIS — J449 Chronic obstructive pulmonary disease, unspecified: Secondary | ICD-10-CM

## 2023-02-21 DIAGNOSIS — I480 Paroxysmal atrial fibrillation: Secondary | ICD-10-CM | POA: Diagnosis not present

## 2023-02-21 DIAGNOSIS — I129 Hypertensive chronic kidney disease with stage 1 through stage 4 chronic kidney disease, or unspecified chronic kidney disease: Secondary | ICD-10-CM | POA: Diagnosis not present

## 2023-02-21 DIAGNOSIS — C342 Malignant neoplasm of middle lobe, bronchus or lung: Secondary | ICD-10-CM | POA: Diagnosis not present

## 2023-02-21 DIAGNOSIS — N189 Chronic kidney disease, unspecified: Secondary | ICD-10-CM | POA: Diagnosis not present

## 2023-02-21 DIAGNOSIS — E039 Hypothyroidism, unspecified: Secondary | ICD-10-CM | POA: Diagnosis not present

## 2023-02-21 DIAGNOSIS — C3490 Malignant neoplasm of unspecified part of unspecified bronchus or lung: Secondary | ICD-10-CM | POA: Diagnosis not present

## 2023-02-21 DIAGNOSIS — R911 Solitary pulmonary nodule: Secondary | ICD-10-CM

## 2023-02-21 DIAGNOSIS — Z8673 Personal history of transient ischemic attack (TIA), and cerebral infarction without residual deficits: Secondary | ICD-10-CM | POA: Insufficient documentation

## 2023-02-21 DIAGNOSIS — E7800 Pure hypercholesterolemia, unspecified: Secondary | ICD-10-CM | POA: Diagnosis not present

## 2023-02-21 DIAGNOSIS — I4819 Other persistent atrial fibrillation: Secondary | ICD-10-CM | POA: Diagnosis not present

## 2023-02-21 DIAGNOSIS — F32A Depression, unspecified: Secondary | ICD-10-CM | POA: Diagnosis not present

## 2023-02-21 DIAGNOSIS — I700 Atherosclerosis of aorta: Secondary | ICD-10-CM | POA: Diagnosis not present

## 2023-02-21 DIAGNOSIS — G8929 Other chronic pain: Secondary | ICD-10-CM | POA: Insufficient documentation

## 2023-02-21 DIAGNOSIS — Z7901 Long term (current) use of anticoagulants: Secondary | ICD-10-CM | POA: Diagnosis not present

## 2023-02-21 DIAGNOSIS — R918 Other nonspecific abnormal finding of lung field: Secondary | ICD-10-CM | POA: Diagnosis present

## 2023-02-21 DIAGNOSIS — F419 Anxiety disorder, unspecified: Secondary | ICD-10-CM | POA: Insufficient documentation

## 2023-02-21 HISTORY — PX: VIDEO BRONCHOSCOPY WITH ENDOBRONCHIAL NAVIGATION: SHX6175

## 2023-02-21 SURGERY — VIDEO BRONCHOSCOPY WITH ENDOBRONCHIAL NAVIGATION
Anesthesia: General

## 2023-02-21 MED ORDER — SUGAMMADEX SODIUM 200 MG/2ML IV SOLN
INTRAVENOUS | Status: DC | PRN
  Administered 2023-02-21: 150 mg via INTRAVENOUS

## 2023-02-21 MED ORDER — ORAL CARE MOUTH RINSE: 15.0000 mL | Freq: Once | OROMUCOSAL | Status: AC

## 2023-02-21 MED ORDER — ONDANSETRON HCL 4 MG/2ML IJ SOLN
INTRAMUSCULAR | Status: AC
  Filled 2023-02-21: qty 2

## 2023-02-21 MED ORDER — PHENYLEPHRINE HCL-NACL 20-0.9 MG/250ML-% IV SOLN
INTRAVENOUS | Status: DC | PRN
  Administered 2023-02-21: 25 ug/min via INTRAVENOUS

## 2023-02-21 MED ORDER — 0.9 % SODIUM CHLORIDE (POUR BTL) OPTIME
TOPICAL | Status: DC | PRN
  Administered 2023-02-21: 1000 mL

## 2023-02-21 MED ORDER — ONDANSETRON HCL 4 MG/2ML IJ SOLN
INTRAMUSCULAR | Status: DC | PRN
  Administered 2023-02-21: 4 mg via INTRAVENOUS

## 2023-02-21 MED ORDER — FENTANYL CITRATE (PF) 250 MCG/5ML IJ SOLN
INTRAMUSCULAR | Status: AC
  Filled 2023-02-21: qty 5

## 2023-02-21 MED ORDER — PROPOFOL 10 MG/ML IV BOLUS
INTRAVENOUS | Status: AC
  Filled 2023-02-21: qty 20

## 2023-02-21 MED ORDER — LACTATED RINGERS IV SOLN: INTRAVENOUS | Status: DC

## 2023-02-21 MED ORDER — CHLORHEXIDINE GLUCONATE 0.12 % MT SOLN
15.0000 mL | Freq: Once | OROMUCOSAL | Status: AC
  Administered 2023-02-21: 15 mL via OROMUCOSAL
  Filled 2023-02-21: qty 15

## 2023-02-21 MED ORDER — FENTANYL CITRATE (PF) 100 MCG/2ML IJ SOLN: 25.0000 ug | INTRAMUSCULAR | Status: DC | PRN

## 2023-02-21 MED ORDER — FENTANYL CITRATE (PF) 100 MCG/2ML IJ SOLN
INTRAMUSCULAR | Status: DC | PRN
  Administered 2023-02-21: 50 ug via INTRAVENOUS

## 2023-02-21 MED ORDER — EPINEPHRINE PF 1 MG/ML IJ SOLN
INTRAMUSCULAR | Status: AC
  Filled 2023-02-21: qty 1

## 2023-02-21 MED ORDER — ROCURONIUM BROMIDE 10 MG/ML (PF) SYRINGE
PREFILLED_SYRINGE | INTRAVENOUS | Status: DC | PRN
  Administered 2023-02-21: 50 mg via INTRAVENOUS
  Administered 2023-02-21: 20 mg via INTRAVENOUS

## 2023-02-21 MED ORDER — LIDOCAINE 2% (20 MG/ML) 5 ML SYRINGE
INTRAMUSCULAR | Status: DC | PRN
  Administered 2023-02-21: 100 mg via INTRAVENOUS

## 2023-02-21 MED ORDER — ROCURONIUM BROMIDE 10 MG/ML (PF) SYRINGE
PREFILLED_SYRINGE | INTRAVENOUS | Status: AC
  Filled 2023-02-21: qty 10

## 2023-02-21 MED ORDER — ONDANSETRON HCL 4 MG/2ML IJ SOLN: 4.0000 mg | Freq: Once | INTRAMUSCULAR | Status: DC | PRN

## 2023-02-21 MED ORDER — PHENYLEPHRINE 80 MCG/ML (10ML) SYRINGE FOR IV PUSH (FOR BLOOD PRESSURE SUPPORT)
PREFILLED_SYRINGE | INTRAVENOUS | Status: DC | PRN
  Administered 2023-02-21: 80 ug via INTRAVENOUS

## 2023-02-21 MED ORDER — PROPOFOL 10 MG/ML IV BOLUS
INTRAVENOUS | Status: DC | PRN
  Administered 2023-02-21: 120 mg via INTRAVENOUS

## 2023-02-21 MED ORDER — DEXAMETHASONE SODIUM PHOSPHATE 10 MG/ML IJ SOLN
INTRAMUSCULAR | Status: AC
  Filled 2023-02-21: qty 1

## 2023-02-21 MED ORDER — LIDOCAINE 2% (20 MG/ML) 5 ML SYRINGE
INTRAMUSCULAR | Status: AC
  Filled 2023-02-21: qty 5

## 2023-02-21 MED ORDER — EPINEPHRINE PF 1 MG/ML IJ SOLN
INTRAMUSCULAR | Status: DC | PRN
  Administered 2023-02-21: 1 mg via ENDOTRACHEOPULMONARY

## 2023-02-21 MED ORDER — DEXAMETHASONE SODIUM PHOSPHATE 10 MG/ML IJ SOLN
INTRAMUSCULAR | Status: DC | PRN
  Administered 2023-02-21: 5 mg via INTRAVENOUS

## 2023-02-21 SURGICAL SUPPLY — 52 items
ADAPTER BRONCHOSCOPE OLYMPUS (ADAPTER) ×1 IMPLANT
ADAPTER VALVE BIOPSY EBUS (MISCELLANEOUS) IMPLANT
ADPR BSCP OLMPS EDG (ADAPTER) ×2
ADPTR VALVE BIOPSY EBUS (MISCELLANEOUS)
BLADE CLIPPER SURG (BLADE) ×1 IMPLANT
BRUSH BIOPSY BRONCH 10 SDTNB (MISCELLANEOUS) IMPLANT
BRUSH SUPERTRAX BIOPSY (INSTRUMENTS) IMPLANT
BRUSH SUPERTRAX NDL-TIP CYTO (INSTRUMENTS) ×1 IMPLANT
CANISTER SUCT 3000ML PPV (MISCELLANEOUS) ×1 IMPLANT
CNTNR URN SCR LID CUP LEK RST (MISCELLANEOUS) ×2 IMPLANT
CONT SPEC 4OZ STRL OR WHT (MISCELLANEOUS) ×1
COVER BACK TABLE 60X90IN (DRAPES) ×1 IMPLANT
FILTER STRAW FLUID ASPIR (MISCELLANEOUS) ×1 IMPLANT
FORCEPS BIOP SUPERTRX PREMAR (INSTRUMENTS) IMPLANT
GAUZE 4X4 16PLY ~~LOC~~+RFID DBL (SPONGE) IMPLANT
GAUZE SPONGE 4X4 12PLY STRL (GAUZE/BANDAGES/DRESSINGS) ×1 IMPLANT
GLOVE BIO SURGEON STRL SZ 6.5 (GLOVE) IMPLANT
GLOVE SS BIOGEL STRL SZ 7.5 (GLOVE) ×1 IMPLANT
GLOVE SURG SIGNA 7.5 PF LTX (GLOVE) ×1 IMPLANT
GOWN STRL REUS W/ TWL LRG LVL3 (GOWN DISPOSABLE) IMPLANT
GOWN STRL REUS W/ TWL XL LVL3 (GOWN DISPOSABLE) ×1 IMPLANT
GOWN STRL REUS W/TWL LRG LVL3 (GOWN DISPOSABLE) ×2
GOWN STRL REUS W/TWL XL LVL3 (GOWN DISPOSABLE) ×1
KIT CLEAN ENDO COMPLIANCE (KITS) ×1 IMPLANT
KIT ILLUMISITE 180 PROCEDURE (KITS) IMPLANT
KIT ILLUMISITE 90 PROCEDURE (KITS) IMPLANT
KIT TURNOVER KIT B (KITS) ×1 IMPLANT
MARKER SKIN DUAL TIP RULER LAB (MISCELLANEOUS) ×1 IMPLANT
NDL SUPERTRX PREMARK BIOPSY (NEEDLE) IMPLANT
NEEDLE SUPERTRX PREMARK BIOPSY (NEEDLE) ×1 IMPLANT
NS IRRIG 1000ML POUR BTL (IV SOLUTION) ×1 IMPLANT
OIL SILICONE PENTAX (PARTS (SERVICE/REPAIRS)) ×1 IMPLANT
PAD ARMBOARD 7.5X6 YLW CONV (MISCELLANEOUS) ×2 IMPLANT
PATCHES PATIENT (LABEL) ×3 IMPLANT
SPONGE T-LAP 18X18 ~~LOC~~+RFID (SPONGE) IMPLANT
SPONGE T-LAP 4X18 ~~LOC~~+RFID (SPONGE) IMPLANT
SYR 20ML ECCENTRIC (SYRINGE) ×1 IMPLANT
SYR 20ML LL LF (SYRINGE) ×2 IMPLANT
SYR 30ML LL (SYRINGE) IMPLANT
SYR 30ML SLIP (SYRINGE) IMPLANT
SYR 50ML LL SCALE MARK (SYRINGE) ×1 IMPLANT
SYR 5ML LL (SYRINGE) ×1 IMPLANT
SYR TB 1ML LUER SLIP (SYRINGE) IMPLANT
TOWEL GREEN STERILE (TOWEL DISPOSABLE) ×1 IMPLANT
TOWEL GREEN STERILE FF (TOWEL DISPOSABLE) ×1 IMPLANT
TRAP SPECIMEN MUCUS 40CC (MISCELLANEOUS) ×1 IMPLANT
TUBE CONNECTING 20X1/4 (TUBING) ×2 IMPLANT
TUBING CONNECTING 10 (TUBING) IMPLANT
UNDERPAD 30X36 HEAVY ABSORB (UNDERPADS AND DIAPERS) ×1 IMPLANT
VALVE BIOPSY SINGLE USE (MISCELLANEOUS) ×1 IMPLANT
VALVE SUCTION BRONCHIO DISP (MISCELLANEOUS) ×1 IMPLANT
WATER STERILE IRR 1000ML POUR (IV SOLUTION) ×1 IMPLANT

## 2023-02-21 NOTE — Brief Op Note (Signed)
02/21/2023  3:34 PM  PATIENT:  [name]  77 y.o. female  PRE-OPERATIVE DIAGNOSIS:  RIGHT MIDDLE LOBE NODULE  POST-OPERATIVE DIAGNOSIS:  RIGHT MIDDLE LOBE NODULE  PROCEDURE:  Procedure(s): VIDEO BRONCHOSCOPY WITH ENDOBRONCHIAL NAVIGATION (N/A)  SURGEON:  Surgeon(s) and Role:    * [name], MD - Primary  PHYSICIAN ASSISTANT:   ASSISTANTS: none   ANESTHESIA:   general  EBL:  1 mL   BLOOD ADMINISTERED:none  DRAINS: none   LOCAL MEDICATIONS USED:  NONE  SPECIMEN:  Source of Specimen:  RML nodule  DISPOSITION OF SPECIMEN:  PATHOLOGY  COUNTS:  NO endoscopic  TOURNIQUET:  * No tourniquets in log *  DICTATION: .Other Dictation: Dictation Number -  PLAN OF CARE: Discharge to home after PACU  PATIENT DISPOSITION:  PACU - hemodynamically stable.   Delay start of Pharmacological VTE agent (>24hrs) due to surgical blood loss or risk of bleeding: not applicable

## 2023-02-21 NOTE — Anesthesia Postprocedure Evaluation (Signed)
Anesthesia Post Note  Patient: [name]  Procedure(s) Performed: VIDEO BRONCHOSCOPY WITH ENDOBRONCHIAL NAVIGATION     Patient location during evaluation: PACU Anesthesia Type: General Level of consciousness: awake and alert Pain management: pain level controlled Vital Signs Assessment: post-procedure vital signs reviewed and stable Respiratory status: spontaneous breathing, nonlabored ventilation, respiratory function stable and patient connected to nasal cannula oxygen Cardiovascular status: blood pressure returned to baseline and stable Postop Assessment: no apparent nausea or vomiting Anesthetic complications: no  No notable events documented.  Last Vitals:  Vitals:   02/21/23 1557 02/21/23 1601  BP:  107/82  Pulse: 92 96  Resp: 12 (!) 9  Temp:  37.3 C  SpO2: 93% 95%    Last Pain:  Vitals:   02/21/23 1601  TempSrc:   PainSc: 0-No pain                 Jera Headings S

## 2023-02-21 NOTE — Interval H&P Note (Signed)
History and Physical Interval Note:  Planning completed  02/21/2023 1:54 PM  [name]  has presented today for surgery, with the diagnosis of RML NODULE.  The various methods of treatment have been discussed with the patient and family. After consideration of risks, benefits and other options for treatment, the patient has consented to  Procedure(s): VIDEO BRONCHOSCOPY WITH ENDOBRONCHIAL NAVIGATION (N/A) as a surgical intervention.  The patient's history has been reviewed, patient examined, no change in status, stable for surgery.  I have reviewed the patient's chart and labs.  Questions were answered to the patient's satisfaction.     [name]

## 2023-02-21 NOTE — Op Note (Signed)
Katie, [name] MEDICAL RECORD NO: [id number] ACCOUNT NO: [id number] DATE OF BIRTH: [birth year] FACILITY: [facility] LOCATION: [facility] PHYSICIAN: Salvatore Decent. Dorris Fetch, MD  Operative Report   DATE OF PROCEDURE: 02/21/2023  PREOPERATIVE DIAGNOSIS:  Right middle lobe lung nodule.  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  PROCEDURE:  Electromagnetic navigational bronchoscopy with needle aspirations, brushings and transbronchial biopsies.  SURGEON:  Salvatore Decent. Dorris Fetch, MD  ASSISTANT:  None.  ANESTHESIA:  General.  FINDINGS:  Quick prep was noted to be adequate for diagnostic purposes.  TOTAL FLUOROSCOPY TIME: 77 seconds, total dose 26.87 milligray.  CLINICAL NOTE:  [name] is a 77 year old woman with numerous medical problems who was noted to have a possible lung nodule on a chest x-ray about 6 months ago.  On a CT scan, there was a 2 cm spiculated right middle lobe lung nodule.  There were multiple other smaller lung nodules.  PET/CT showed the nodule was hypermetabolic with an SUV of 2.7 with no evidence of regional or distant metastatic disease.  She was advised to undergo navigational bronchoscopy for biopsy for diagnostic purposes.  The indications, risks, benefits, and alternatives were discussed in detail with the patient.  She understood and accepted the risks and agreed to proceed.  OPERATIVE NOTE:  [name] was brought to the operating room on 02/21/2023.  She had induction of general anesthesia and was intubated.  Sequential compression devices were placed on the calves for DVT prophylaxis and a Bair Hugger was placed for active warming.  Planning for the navigational bronchoscopy was done prior to induction of anesthesia.  A timeout was performed.  Flexible fiberoptic bronchoscopy was performed via the endotracheal tube, it revealed normal endobronchial anatomy with no endobronchial lesions to the  level of subsegmental bronchi.  Locatable guide for navigation was placed.  Registration was performed.  The locatable guide then was directed to the appropriate subsegmental bronchus of the right middle lobe and directed within a centimeter of the nodule with good alignment. By fluoroscopy, there appeared to be good alignment with the nodule.  An attempt was made to do local registration but was unsuccessful due to technical issues with the fluoroscopy machine.  That machine was changed out, but ultimately local registration was not necessary.  With the catheter in place with good proximity by fluoroscopy sampling was performed. 3 transbronchial needle aspirations were performed, but were all negative on quick prep. Multiple brushings then were taken. On the third brushing, there were groups of atypical cells, but not adequate for diagnostic purposes.  Two additional brushings were taken, but were negative.  Next, a transbronchial biopsy was performed.  A touch prep was performed and that was a diagnostic. Approximately 10 additional biopsies were taken, again all using fluoroscopy for guidance with all of the tissue being sent for permanent pathology.  There was some minor bleeding from the airway, which cleared with a dilute epinephrine and saline solution.  There was no ongoing bleeding.  The bronchoscope was removed.  A final inspection was made with fluoroscopy and there was no evidence of pneumothorax.  The patient was extubated in the operating room and taken to the postanesthetic care unit in good condition.   NIK D: 02/21/2023 5:32:45 pm T: 02/21/2023 9:21:00 pm  JOB: [id number]/ [id number]

## 2023-02-21 NOTE — Discharge Instructions (Signed)
Do not drive or engage in heavy physical activity for 24 hours.  You may resume normal activities tomorrow.  You may use acetaminophen (Tylenol) if needed for discomfort.  You may use over the counter throat lozenges or cough medication as needed.  You may cough up small amounts of blood over the next few days.    Call [phone] if you have chest pain, shortness of breath, fever > 101 F or cough up more than a tablespoon of blood.  You may resume apixaban (Eliquis) on Friday 7/5  My office will contact you with a follow up appointment.  My office will arrange appointments with Dr. Arbutus Ped of Oncology and one of our Radiation Oncologists.

## 2023-02-21 NOTE — Anesthesia Procedure Notes (Signed)
Procedure Name: Intubation Date/Time: 02/21/2023 2:12 PM  Performed by: De Nurse, CRNAPre-anesthesia Checklist: Patient identified, Emergency Drugs available, Suction available and Patient being monitored Patient Re-evaluated:Patient Re-evaluated prior to induction Oxygen Delivery Method: Circle System Utilized Preoxygenation: Pre-oxygenation with 100% oxygen Induction Type: IV induction Ventilation: Mask ventilation without difficulty Laryngoscope Size: Mac and 3 Grade View: Grade I Tube type: Oral Tube size: 8.5 mm Number of attempts: 1 Airway Equipment and Method: Stylet and Oral airway Placement Confirmation: ETT inserted through vocal cords under direct vision, positive ETCO2 and breath sounds checked- equal and bilateral Secured at: 22 cm Tube secured with: Tape Dental Injury: Teeth and Oropharynx as per pre-operative assessment

## 2023-02-21 NOTE — Transfer of Care (Signed)
Immediate Anesthesia Transfer of Care Note  Patient: [name]  Procedure(s) Performed: VIDEO BRONCHOSCOPY WITH ENDOBRONCHIAL NAVIGATION  Patient Location: PACU  Anesthesia Type:General  Level of Consciousness: awake, alert , and oriented  Airway & Oxygen Therapy: Patient Spontanous Breathing  Post-op Assessment: Report given to RN  Post vital signs: Reviewed and stable  Last Vitals:  Vitals Value Taken Time  BP 119/74 02/21/23 1533  Temp    Pulse 90 02/21/23 1534  Resp 10 02/21/23 1534  SpO2 96 % 02/21/23 1534  Vitals shown include unvalidated device data.  Last Pain:  Vitals:   02/21/23 1237  TempSrc:   PainSc: 0-No pain      Patients Stated Pain Goal: 0 (02/21/23 1237)  Complications: No notable events documented.

## 2023-02-22 ENCOUNTER — Encounter (HOSPITAL_COMMUNITY): Payer: Self-pay | Admitting: Thoracic Surgery (Cardiothoracic Vascular Surgery)

## 2023-02-23 LAB — CYTOLOGY - NON PAP

## 2023-02-23 LAB — SURGICAL PATHOLOGY

## 2023-02-27 NOTE — Progress Notes (Signed)
Thoracic Location of Tumor / Histology: Right Middle Lobe Lung  Patient presented to the hospital about 6 months ago and was found to have a questionable lung nodule on a chest x-ray.  PET 01/29/2023: 1.9 cm posterior right middle lobe nodule, suspicious for primary bronchogenic carcinoma.  CT Chest 01/02/2023: Irregular solid 2.0 cm posterior right middle lobe pulmonary nodule.A few additional smaller scattered indistinct bilateral pulmonary nodules measuring up to 0.8 cm in the left upper lobe, generally below PET resolution.    Biopsies of Right Middle Lobe Lung 02/21/2023   Past/Anticipated interventions by cardiothoracic surgery, if any:  Dr. Dorris Fetch 02/15/2023 -While the differential diagnosis includes infectious and inflammatory nodules this is highly suspicious for a new primary bronchogenic carcinoma.  -I do not think she is a good surgical candidate. However I think she can tolerate stereotactic radiation quite well.  -I recommended we proceed with a navigational bronchoscopy.    Past/Anticipated interventions by medical oncology, if any:   Tobacco/Marijuana/Snuff/ETOH use: Former Smoker, quit 25 years ago  Signs/Symptoms Weight changes, if any: Denies. Respiratory complaints, if any: She reports occasional SOB. Hemoptysis, if any: She reports occasional productive cough with clear sputum. Pain issues, if any: Denies chest pain, pressure or tightness.   SAFETY ISSUES: Prior radiation? No Pacemaker/ICD?  No Possible current pregnancy? Hysterectomy Is the patient on methotrexate? No  Current Complaints / other details:
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
## 2023-02-28 ENCOUNTER — Ambulatory Visit
Admission: RE | Admit: 2023-02-28 | Discharge: 2023-02-28 | Disposition: A | Discharge status: Home / Self Care | Payer: Medicare HMO | Source: Ambulatory Visit | Attending: Radiation Oncology | Admitting: Radiation Oncology

## 2023-02-28 ENCOUNTER — Encounter: Payer: Self-pay | Admitting: Radiation Oncology

## 2023-02-28 ENCOUNTER — Other Ambulatory Visit: Payer: Self-pay | Admitting: Physician Assistant

## 2023-02-28 VITALS — BP 111/79 | HR 86 | Temp 97.7°F | Resp 20 | Ht 65.0 in | Wt 132.6 lb

## 2023-02-28 DIAGNOSIS — I700 Atherosclerosis of aorta: Secondary | ICD-10-CM | POA: Insufficient documentation

## 2023-02-28 DIAGNOSIS — R1310 Dysphagia, unspecified: Secondary | ICD-10-CM | POA: Insufficient documentation

## 2023-02-28 DIAGNOSIS — R911 Solitary pulmonary nodule: Secondary | ICD-10-CM

## 2023-02-28 DIAGNOSIS — E039 Hypothyroidism, unspecified: Secondary | ICD-10-CM | POA: Diagnosis not present

## 2023-02-28 DIAGNOSIS — N189 Chronic kidney disease, unspecified: Secondary | ICD-10-CM | POA: Diagnosis not present

## 2023-02-28 DIAGNOSIS — Z7901 Long term (current) use of anticoagulants: Secondary | ICD-10-CM | POA: Insufficient documentation

## 2023-02-28 DIAGNOSIS — K219 Gastro-esophageal reflux disease without esophagitis: Secondary | ICD-10-CM | POA: Diagnosis not present

## 2023-02-28 DIAGNOSIS — I129 Hypertensive chronic kidney disease with stage 1 through stage 4 chronic kidney disease, or unspecified chronic kidney disease: Secondary | ICD-10-CM | POA: Insufficient documentation

## 2023-02-28 DIAGNOSIS — Z87891 Personal history of nicotine dependence: Secondary | ICD-10-CM | POA: Insufficient documentation

## 2023-02-28 DIAGNOSIS — I2510 Atherosclerotic heart disease of native coronary artery without angina pectoris: Secondary | ICD-10-CM | POA: Insufficient documentation

## 2023-02-28 DIAGNOSIS — M545 Low back pain, unspecified: Secondary | ICD-10-CM | POA: Diagnosis not present

## 2023-02-28 DIAGNOSIS — Z8673 Personal history of transient ischemic attack (TIA), and cerebral infarction without residual deficits: Secondary | ICD-10-CM | POA: Insufficient documentation

## 2023-02-28 DIAGNOSIS — I4819 Other persistent atrial fibrillation: Secondary | ICD-10-CM | POA: Diagnosis not present

## 2023-02-28 DIAGNOSIS — Z79890 Hormone replacement therapy (postmenopausal): Secondary | ICD-10-CM | POA: Insufficient documentation

## 2023-02-28 DIAGNOSIS — K449 Diaphragmatic hernia without obstruction or gangrene: Secondary | ICD-10-CM | POA: Diagnosis not present

## 2023-02-28 DIAGNOSIS — E559 Vitamin D deficiency, unspecified: Secondary | ICD-10-CM | POA: Insufficient documentation

## 2023-02-28 DIAGNOSIS — J449 Chronic obstructive pulmonary disease, unspecified: Secondary | ICD-10-CM | POA: Insufficient documentation

## 2023-02-28 DIAGNOSIS — E7800 Pure hypercholesterolemia, unspecified: Secondary | ICD-10-CM | POA: Diagnosis not present

## 2023-02-28 DIAGNOSIS — Z791 Long term (current) use of non-steroidal anti-inflammatories (NSAID): Secondary | ICD-10-CM | POA: Diagnosis not present

## 2023-02-28 DIAGNOSIS — M47816 Spondylosis without myelopathy or radiculopathy, lumbar region: Secondary | ICD-10-CM | POA: Insufficient documentation

## 2023-02-28 DIAGNOSIS — C342 Malignant neoplasm of middle lobe, bronchus or lung: Secondary | ICD-10-CM

## 2023-02-28 DIAGNOSIS — Z79899 Other long term (current) drug therapy: Secondary | ICD-10-CM | POA: Insufficient documentation

## 2023-03-01 ENCOUNTER — Other Ambulatory Visit: Payer: Self-pay

## 2023-03-01 ENCOUNTER — Inpatient Hospital Stay: Payer: Medicare HMO

## 2023-03-01 ENCOUNTER — Inpatient Hospital Stay: Payer: Medicare HMO | Attending: Physician Assistant | Admitting: Physician Assistant

## 2023-03-01 ENCOUNTER — Ambulatory Visit: Payer: Medicare HMO | Admitting: Thoracic Surgery (Cardiothoracic Vascular Surgery)

## 2023-03-01 VITALS — BP 114/77 | HR 86 | Temp 98.3°F | Resp 13 | Wt 133.3 lb

## 2023-03-01 DIAGNOSIS — N189 Chronic kidney disease, unspecified: Secondary | ICD-10-CM | POA: Diagnosis not present

## 2023-03-01 DIAGNOSIS — Z87891 Personal history of nicotine dependence: Secondary | ICD-10-CM | POA: Diagnosis not present

## 2023-03-01 DIAGNOSIS — Z905 Acquired absence of kidney: Secondary | ICD-10-CM | POA: Diagnosis not present

## 2023-03-01 DIAGNOSIS — C342 Malignant neoplasm of middle lobe, bronchus or lung: Secondary | ICD-10-CM | POA: Insufficient documentation

## 2023-03-01 DIAGNOSIS — R911 Solitary pulmonary nodule: Secondary | ICD-10-CM

## 2023-03-01 DIAGNOSIS — C3490 Malignant neoplasm of unspecified part of unspecified bronchus or lung: Secondary | ICD-10-CM | POA: Insufficient documentation

## 2023-03-01 LAB — CMP (CANCER CENTER ONLY)
ALT: 14 U/L (ref 0–44)
AST: 14 U/L — ABNORMAL LOW (ref 15–41)
Albumin: 3.8 g/dL (ref 3.5–5.0)
Alkaline Phosphatase: 67 U/L (ref 38–126)
Anion gap: 7 (ref 5–15)
BUN: 22 mg/dL (ref 8–23)
CO2: 27 mmol/L (ref 22–32)
Calcium: 9.7 mg/dL (ref 8.9–10.3)
Chloride: 108 mmol/L (ref 98–111)
Creatinine: 0.99 mg/dL (ref 0.44–1.00)
GFR, Estimated: 59 mL/min — ABNORMAL LOW (ref >60)
Glucose, Bld: 94 mg/dL (ref 70–99)
Potassium: 4.4 mmol/L (ref 3.5–5.1)
Sodium: 142 mmol/L (ref 135–145)
Total Bilirubin: 0.5 mg/dL (ref 0.3–1.2)
Total Protein: 6.4 g/dL — ABNORMAL LOW (ref 6.5–8.1)

## 2023-03-01 LAB — CBC WITH DIFFERENTIAL (CANCER CENTER ONLY)
Abs Immature Granulocytes: 0.03 10*3/uL (ref 0.00–0.07)
Basophils Absolute: 0 10*3/uL (ref 0.0–0.1)
Basophils Relative: 0 %
Eosinophils Absolute: 0.2 10*3/uL (ref 0.0–0.5)
Eosinophils Relative: 3 %
HCT: 38.6 % (ref 36.0–46.0)
Hemoglobin: 12.8 g/dL (ref 12.0–15.0)
Immature Granulocytes: 1 %
Lymphocytes Relative: 30 %
Lymphs Abs: 1.4 10*3/uL (ref 0.7–4.0)
MCH: 32.6 pg (ref 26.0–34.0)
MCHC: 33.2 g/dL (ref 30.0–36.0)
MCV: 98.2 fL (ref 80.0–100.0)
Monocytes Absolute: 0.6 10*3/uL (ref 0.1–1.0)
Monocytes Relative: 12 %
Neutro Abs: 2.5 10*3/uL (ref 1.7–7.7)
Neutrophils Relative %: 54 %
Platelet Count: 256 10*3/uL (ref 150–400)
RBC: 3.93 MIL/uL (ref 3.87–5.11)
RDW: 12.5 % (ref 11.5–15.5)
WBC Count: 4.6 10*3/uL (ref 4.0–10.5)
nRBC: 0 % (ref 0.0–0.2)

## 2023-03-02 ENCOUNTER — Telehealth: Payer: Self-pay | Admitting: *Deleted

## 2023-03-02 ENCOUNTER — Other Ambulatory Visit: Payer: Self-pay

## 2023-03-02 ENCOUNTER — Ambulatory Visit
Admission: RE | Admit: 2023-03-02 | Discharge: 2023-03-02 | Disposition: A | Discharge status: Home / Self Care | Payer: Medicare HMO | Source: Ambulatory Visit | Attending: Radiation Oncology | Admitting: Radiation Oncology

## 2023-03-02 DIAGNOSIS — C342 Malignant neoplasm of middle lobe, bronchus or lung: Secondary | ICD-10-CM | POA: Insufficient documentation

## 2023-03-02 DIAGNOSIS — Z510 Encounter for antineoplastic radiation therapy: Secondary | ICD-10-CM | POA: Insufficient documentation

## 2023-03-02 DIAGNOSIS — Z87891 Personal history of nicotine dependence: Secondary | ICD-10-CM | POA: Diagnosis not present

## 2023-03-02 NOTE — Progress Notes (Signed)
The proposed treatment discussed in conference is for discussion purpose only and is not a binding recommendation.  The patients have not been physically examined, or presented with their treatment options.  Therefore, final treatment plans cannot be decided.  

## 2023-03-02 NOTE — Telephone Encounter (Signed)
Called patient and spoke with patient. 

## 2023-03-06 ENCOUNTER — Encounter (HOSPITAL_COMMUNITY): Payer: Self-pay

## 2023-03-07 DIAGNOSIS — Z510 Encounter for antineoplastic radiation therapy: Secondary | ICD-10-CM | POA: Diagnosis not present

## 2023-03-07 DIAGNOSIS — H4720 Unspecified optic atrophy: Secondary | ICD-10-CM | POA: Diagnosis not present

## 2023-03-07 DIAGNOSIS — Z87891 Personal history of nicotine dependence: Secondary | ICD-10-CM | POA: Diagnosis not present

## 2023-03-07 DIAGNOSIS — C342 Malignant neoplasm of middle lobe, bronchus or lung: Secondary | ICD-10-CM | POA: Diagnosis not present

## 2023-03-07 DIAGNOSIS — H04123 Dry eye syndrome of bilateral lacrimal glands: Secondary | ICD-10-CM | POA: Diagnosis not present

## 2023-03-07 DIAGNOSIS — H43813 Vitreous degeneration, bilateral: Secondary | ICD-10-CM | POA: Diagnosis not present

## 2023-03-07 DIAGNOSIS — H52203 Unspecified astigmatism, bilateral: Secondary | ICD-10-CM | POA: Diagnosis not present

## 2023-03-07 DIAGNOSIS — H524 Presbyopia: Secondary | ICD-10-CM | POA: Diagnosis not present

## 2023-03-08 DIAGNOSIS — N3021 Other chronic cystitis with hematuria: Secondary | ICD-10-CM | POA: Diagnosis not present

## 2023-03-08 DIAGNOSIS — C3490 Malignant neoplasm of unspecified part of unspecified bronchus or lung: Secondary | ICD-10-CM | POA: Diagnosis not present

## 2023-03-12 ENCOUNTER — Encounter: Payer: Self-pay | Admitting: Internal Medicine

## 2023-03-12 NOTE — Progress Notes (Signed)
Called pt to introduce myself as her Dance movement psychotherapist and to discuss the [employer].  I went over what it covers and gave her the income requirement.  She would like to apply so she will bring proof of income on 03/13/23.  If approved I will give her an expense sheet and my card for any questions or concerns she may have in the future.

## 2023-03-13 ENCOUNTER — Other Ambulatory Visit: Payer: Self-pay

## 2023-03-13 ENCOUNTER — Encounter (HOSPITAL_COMMUNITY): Payer: Self-pay

## 2023-03-13 ENCOUNTER — Ambulatory Visit
Admission: RE | Admit: 2023-03-13 | Discharge: 2023-03-13 | Disposition: A | Discharge status: Home / Self Care | Payer: Medicare HMO | Source: Ambulatory Visit | Attending: Radiation Oncology | Admitting: Radiation Oncology

## 2023-03-13 DIAGNOSIS — C342 Malignant neoplasm of middle lobe, bronchus or lung: Secondary | ICD-10-CM | POA: Diagnosis not present

## 2023-03-13 DIAGNOSIS — Z510 Encounter for antineoplastic radiation therapy: Secondary | ICD-10-CM | POA: Diagnosis not present

## 2023-03-13 LAB — RAD ONC ARIA SESSION SUMMARY
Course Elapsed Days: 0
Plan Fractions Treated to Date: 1
Plan Prescribed Dose Per Fraction: 18 Gy
Plan Total Fractions Prescribed: 3
Plan Total Prescribed Dose: 54 Gy
Reference Point Dosage Given to Date: 18 Gy
Reference Point Session Dosage Given: 18 Gy
Session Number: 1

## 2023-03-14 ENCOUNTER — Ambulatory Visit: Payer: Medicare HMO | Admitting: Radiation Oncology

## 2023-03-15 ENCOUNTER — Other Ambulatory Visit: Payer: Self-pay

## 2023-03-15 ENCOUNTER — Ambulatory Visit
Admission: RE | Admit: 2023-03-15 | Discharge: 2023-03-15 | Disposition: A | Discharge status: Home / Self Care | Payer: Medicare HMO | Source: Ambulatory Visit | Attending: Radiation Oncology | Admitting: Radiation Oncology

## 2023-03-15 DIAGNOSIS — Z510 Encounter for antineoplastic radiation therapy: Secondary | ICD-10-CM | POA: Diagnosis not present

## 2023-03-15 DIAGNOSIS — C342 Malignant neoplasm of middle lobe, bronchus or lung: Secondary | ICD-10-CM | POA: Diagnosis not present

## 2023-03-15 LAB — RAD ONC ARIA SESSION SUMMARY
Course Elapsed Days: 2
Plan Fractions Treated to Date: 2
Plan Prescribed Dose Per Fraction: 18 Gy
Plan Total Fractions Prescribed: 3
Plan Total Prescribed Dose: 54 Gy
Reference Point Dosage Given to Date: 36 Gy
Reference Point Session Dosage Given: 18 Gy
Session Number: 2

## 2023-03-16 ENCOUNTER — Ambulatory Visit: Payer: Medicare HMO | Admitting: Radiation Oncology

## 2023-03-16 ENCOUNTER — Encounter: Payer: Self-pay | Admitting: Internal Medicine

## 2023-03-16 NOTE — Progress Notes (Signed)
Pt is approved for the $1000 [employer] grant.  

## 2023-03-19 ENCOUNTER — Ambulatory Visit: Payer: Medicare HMO | Admitting: Radiation Oncology

## 2023-03-20 ENCOUNTER — Ambulatory Visit: Admission: RE | Admit: 2023-03-20 | Payer: Medicare HMO | Source: Ambulatory Visit

## 2023-03-20 ENCOUNTER — Ambulatory Visit: Admission: RE | Admit: 2023-03-20 | Payer: Medicare HMO | Source: Ambulatory Visit | Admitting: Radiation Oncology

## 2023-03-20 ENCOUNTER — Other Ambulatory Visit: Payer: Self-pay

## 2023-03-20 DIAGNOSIS — C342 Malignant neoplasm of middle lobe, bronchus or lung: Secondary | ICD-10-CM

## 2023-03-20 DIAGNOSIS — Z87891 Personal history of nicotine dependence: Secondary | ICD-10-CM | POA: Diagnosis not present

## 2023-03-20 DIAGNOSIS — Z510 Encounter for antineoplastic radiation therapy: Secondary | ICD-10-CM | POA: Diagnosis not present

## 2023-03-20 LAB — RAD ONC ARIA SESSION SUMMARY
Course Elapsed Days: 7
Plan Fractions Treated to Date: 3
Plan Prescribed Dose Per Fraction: 18 Gy
Plan Total Fractions Prescribed: 3
Plan Total Prescribed Dose: 54 Gy
Reference Point Dosage Given to Date: 54 Gy
Reference Point Session Dosage Given: 18 Gy
Session Number: 3

## 2023-03-20 NOTE — Progress Notes (Signed)
  Radiation Oncology         [phone] ________________________________  Name: [name] MRN: [id number]  Date: 03/20/2023  DOB: 1946/07/06  End of Treatment Note  Diagnosis:    Stage IA2, cT1bN0M0, NSCLC, adenocarcinoma of the RML  Indication for treatment:  Curative       Radiation treatment dates:   03/13/23-03/20/23  Site/dose:   The tumor in the RML was treated with a course of stereotactic body radiation treatment. The patient received 54 Gy In 3 fractions at 18 G per fraction.  Narrative: The patient tolerated radiation treatment relatively well.   The patient did not have any signs of acute toxicity during treatment.  Plan: The patient will receive a call in about one month from the radiation oncology department. She will continue follow up with Dr. Arbutus Ped as well.       Osker Mason, PAC

## 2023-03-21 ENCOUNTER — Ambulatory Visit: Payer: Medicare HMO | Admitting: Radiation Oncology

## 2023-03-21 NOTE — Radiation Completion Notes (Signed)
Patient Name: [name], [name] MRN: [id number] Date of Birth: [birth year] Referring Physician: Charlett Lango, M.D. Date of Service: 2023-03-21 Radiation Oncologist: Dorothy Puffer, M.D. [facility]                             RADIATION ONCOLOGY END OF TREATMENT NOTE     Diagnosis: C34.2 Malignant neoplasm of middle lobe, bronchus or lung Intent: Curative     ==========DELIVERED PLANS==========  First Treatment Date: 2023-03-13 - Last Treatment Date: 2023-03-20   Plan Name: Lung_R_SBRT Site: Lung, Right Technique: SBRT/SRT-IMRT Mode: Photon Dose Per Fraction: 18 Gy Prescribed Dose (Delivered / Prescribed): 54 Gy / 54 Gy Prescribed Fxs (Delivered / Prescribed): 3 / 3     ==========ON TREATMENT VISIT DATES========== 2023-03-13, 2023-03-15, 2023-03-20     ==========UPCOMING VISITS========== 2023-08-21 No Location Listed Wait List No Provider Listed        ==========APPENDIX - ON TREATMENT VISIT NOTES==========   See weekly On Treatment Notes in Epic for details.

## 2023-03-23 ENCOUNTER — Ambulatory Visit: Payer: Medicare HMO | Admitting: Radiation Oncology

## 2023-03-26 ENCOUNTER — Encounter (INDEPENDENT_AMBULATORY_CARE_PROVIDER_SITE_OTHER): Payer: Self-pay | Admitting: Otolaryngology

## 2023-03-26 ENCOUNTER — Ambulatory Visit (INDEPENDENT_AMBULATORY_CARE_PROVIDER_SITE_OTHER): Payer: Medicare HMO | Admitting: Otolaryngology

## 2023-03-26 VITALS — BP 117/81 | HR 92 | Ht 65.0 in | Wt 135.0 lb

## 2023-03-26 DIAGNOSIS — Z87891 Personal history of nicotine dependence: Secondary | ICD-10-CM

## 2023-03-26 DIAGNOSIS — R1310 Dysphagia, unspecified: Secondary | ICD-10-CM | POA: Diagnosis not present

## 2023-03-26 DIAGNOSIS — J351 Hypertrophy of tonsils: Secondary | ICD-10-CM | POA: Diagnosis not present

## 2023-03-26 DIAGNOSIS — K219 Gastro-esophageal reflux disease without esophagitis: Secondary | ICD-10-CM

## 2023-03-26 DIAGNOSIS — C342 Malignant neoplasm of middle lobe, bronchus or lung: Secondary | ICD-10-CM | POA: Diagnosis not present

## 2023-03-26 DIAGNOSIS — D490 Neoplasm of unspecified behavior of digestive system: Secondary | ICD-10-CM
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
## 2023-03-26 NOTE — Patient Instructions (Signed)
-   schedule CT neck with contrast - return after scan

## 2023-04-02 ENCOUNTER — Other Ambulatory Visit (HOSPITAL_COMMUNITY): Payer: Self-pay | Admitting: *Deleted

## 2023-04-02 DIAGNOSIS — R1310 Dysphagia, unspecified: Secondary | ICD-10-CM

## 2023-04-04 DIAGNOSIS — R3121 Asymptomatic microscopic hematuria: Secondary | ICD-10-CM | POA: Diagnosis not present

## 2023-04-04 DIAGNOSIS — N3021 Other chronic cystitis with hematuria: Secondary | ICD-10-CM | POA: Diagnosis not present

## 2023-04-04 DIAGNOSIS — R350 Frequency of micturition: Secondary | ICD-10-CM | POA: Diagnosis not present

## 2023-04-04 DIAGNOSIS — N952 Postmenopausal atrophic vaginitis: Secondary | ICD-10-CM | POA: Diagnosis not present

## 2023-04-04 DIAGNOSIS — R3914 Feeling of incomplete bladder emptying: Secondary | ICD-10-CM | POA: Diagnosis not present

## 2023-04-11 ENCOUNTER — Ambulatory Visit (INDEPENDENT_AMBULATORY_CARE_PROVIDER_SITE_OTHER): Payer: Medicare HMO | Admitting: Orthopaedic Surgery

## 2023-04-11 ENCOUNTER — Ambulatory Visit (HOSPITAL_COMMUNITY)
Admission: RE | Admit: 2023-04-11 | Discharge: 2023-04-11 | Disposition: A | Discharge status: Home / Self Care | Payer: Medicare HMO | Source: Ambulatory Visit | Attending: Internal Medicine | Admitting: Internal Medicine

## 2023-04-11 ENCOUNTER — Telehealth: Payer: Self-pay

## 2023-04-11 DIAGNOSIS — R059 Cough, unspecified: Secondary | ICD-10-CM | POA: Diagnosis not present

## 2023-04-11 DIAGNOSIS — M25562 Pain in left knee: Secondary | ICD-10-CM

## 2023-04-11 DIAGNOSIS — M1711 Unilateral primary osteoarthritis, right knee: Secondary | ICD-10-CM | POA: Diagnosis not present

## 2023-04-11 DIAGNOSIS — M25561 Pain in right knee: Secondary | ICD-10-CM | POA: Diagnosis not present

## 2023-04-11 DIAGNOSIS — M170 Bilateral primary osteoarthritis of knee: Secondary | ICD-10-CM

## 2023-04-11 DIAGNOSIS — M1712 Unilateral primary osteoarthritis, left knee: Secondary | ICD-10-CM

## 2023-04-11 DIAGNOSIS — G8929 Other chronic pain: Secondary | ICD-10-CM | POA: Diagnosis not present

## 2023-04-11 DIAGNOSIS — J449 Chronic obstructive pulmonary disease, unspecified: Secondary | ICD-10-CM | POA: Diagnosis not present

## 2023-04-11 DIAGNOSIS — K219 Gastro-esophageal reflux disease without esophagitis: Secondary | ICD-10-CM | POA: Insufficient documentation

## 2023-04-11 DIAGNOSIS — R1310 Dysphagia, unspecified: Secondary | ICD-10-CM | POA: Diagnosis not present

## 2023-04-11 MED ORDER — METHYLPREDNISOLONE ACETATE 40 MG/ML IJ SUSP
40.0000 mg | INTRAMUSCULAR | Status: AC | PRN
Start: 2023-04-11 — End: 2023-04-11
  Administered 2023-04-11: 40 mg via INTRA_ARTICULAR

## 2023-04-11 MED ORDER — LIDOCAINE HCL 1 % IJ SOLN
3.0000 mL | INTRAMUSCULAR | Status: AC | PRN
Start: 2023-04-11 — End: 2023-04-11
  Administered 2023-04-11: 3 mL

## 2023-04-11 NOTE — Progress Notes (Signed)
The patient is a 77 year old female on Eliquis who is well-known to [place].  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  The last time we placed a steroid injection in both knees was over a year ago.  She is requesting those today and even considering knee braces for her knees just due to pain and instability.  She does ambulate with a rolling walker.  Family is with her to her well-known to me.  Examination of both knees shows significant flexion and extension pain and contractures due to arthritic changes.  There is malalignment of both knees.  Previous x-rays also confirm end-stage arthritis.  Per the patient's request I did place a steroid injection in both knees today.  She knows to wait at least 3 to 4 months between these types of injections.  We did place knee braces on her knees as well to see if this would help with stability purposes.  All questions and concerns were addressed and answered.    Procedure Note  Patient: [name]             Date of Birth: Oct 06, 1945           MRN: [id number]             Visit Date: 04/11/2023  Procedures: Visit Diagnoses:  1. Chronic pain of left knee   2. Chronic pain of right knee   3. Unilateral primary osteoarthritis, left knee   4. Unilateral primary osteoarthritis, right knee     Large Joint Inj: R knee on 04/11/2023 4:23 PM Indications: diagnostic evaluation and pain Details: 22 G 1.5 in needle, superolateral approach  Arthrogram: No  Medications: 3 mL lidocaine 1 %; 40 mg methylPREDNISolone acetate 40 MG/ML Outcome: tolerated well, no immediate complications Procedure, treatment alternatives, risks and benefits explained, specific risks discussed. Consent was given by the patient. Immediately prior to procedure a time out was called to verify the correct patient, procedure, equipment, support staff and site/side marked as required. Patient was prepped and draped in the usual  sterile fashion.    Large Joint Inj: L knee on 04/11/2023 4:23 PM Indications: diagnostic evaluation and pain Details: 22 G 1.5 in needle, superolateral approach  Arthrogram: No  Medications: 3 mL lidocaine 1 %; 40 mg methylPREDNISolone acetate 40 MG/ML Outcome: tolerated well, no immediate complications Procedure, treatment alternatives, risks and benefits explained, specific risks discussed. Consent was given by the patient. Immediately prior to procedure a time out was called to verify the correct patient, procedure, equipment, support staff and site/side marked as required. Patient was prepped and draped in the usual sterile fashion.

## 2023-04-11 NOTE — Telephone Encounter (Addendum)
Called patient, advised  CT scan was approved, to call pre-service (phone number provided) to schedule appointment.  Her appointment for swallow test is today.  Patient did mention there is a soft knot on the left side of her throat that came up 04/09/2023.  It is visual, skin color, soft, and sore.  Her follow up appointment with Dr. Irene Pap is 05/07/2023. I ask her to see if she can get her CT scan done first available and I will move her appointment up sooner.  Patient understood and agreed with plan.  CT approval#[id number] good from 8/22/-06/11/2023

## 2023-04-27 ENCOUNTER — Ambulatory Visit (HOSPITAL_COMMUNITY): Payer: Medicare HMO

## 2023-05-07 ENCOUNTER — Ambulatory Visit (INDEPENDENT_AMBULATORY_CARE_PROVIDER_SITE_OTHER): Payer: Medicare HMO | Admitting: Otolaryngology

## 2023-05-09 ENCOUNTER — Telehealth: Payer: Self-pay

## 2023-05-09 DIAGNOSIS — I4819 Other persistent atrial fibrillation: Secondary | ICD-10-CM

## 2023-05-09 MED ORDER — APIXABAN 5 MG PO TABS
5.0000 mg | ORAL_TABLET | Freq: Two times a day (BID) | ORAL | 0 refills | Status: DC
Start: 2023-05-09 — End: 2023-07-12

## 2023-05-09 NOTE — Telephone Encounter (Signed)
Gave all information to daughter in great detail.  She is aware that there are samples of Eliquis and the applications at the front desk.  She understands what is needed for the assistance.

## 2023-05-09 NOTE — Telephone Encounter (Signed)
OK to give samples. She is 5mg  BID

## 2023-05-09 NOTE — Telephone Encounter (Signed)
They should only re-apply for Eliquis patient assistance if they think that together they have spent 3% of their annual household income on prescription expenses. Pharmacy can give them print out of expenses. [employer] can tell them what that required # is.   Other options would be:   Xarelto: Xarelto is a medication similar to Eliquis, but it is taken just once a day. The drug company offers a program called Xarelto with Me Coverage Gap Support. It runs from April 1-December 31. If you are in the coverage gap, you can get Xarelto for $89 (for 30 days) or $250 (for 90 days), plus sales tax if applicable. You can call to enroll at: 888-XARELTO [phone])     Warfarin: Warfarin is very inexpensive but does require more monitoring. You will need to come to clinic for weekly INR (blood test) checks with our [facility]ticoagulation [facility] for the first month. These visits may have a copay. Then as we find a stable dose for you, you will start to come less often. You will be required to have your INR checked at least every 6 weeks. There are also more drug interactions and dietary interactions with warfarin.

## 2023-05-09 NOTE — Telephone Encounter (Signed)
Patient daughter states she is going to run out of Eliquis and she needs samples.  She cannot afford the medication and ask for other options.  Stated that she, wife.  applied in the past but nothing "came of it".  Please advise if she should reapply for them both and if samples can be given.   Message sent along with husbands chart message

## 2023-05-09 NOTE — Addendum Note (Signed)
Addended by: Candie Chroman on: 05/09/2023 01:15 PM   Modules accepted: Orders

## 2023-05-09 NOTE — Telephone Encounter (Signed)
Call to daughter to advise. LM to call office

## 2023-05-22 ENCOUNTER — Inpatient Hospital Stay (HOSPITAL_COMMUNITY)
Admission: EM | Admit: 2023-05-22 | Discharge: 2023-05-26 | DRG: 690 | Disposition: A | Discharge status: Home Health | Payer: Medicare HMO | Attending: Internal Medicine | Admitting: Internal Medicine

## 2023-05-22 ENCOUNTER — Other Ambulatory Visit: Payer: Self-pay

## 2023-05-22 ENCOUNTER — Encounter (HOSPITAL_COMMUNITY): Payer: Self-pay

## 2023-05-22 ENCOUNTER — Emergency Department (HOSPITAL_COMMUNITY): Payer: Medicare HMO

## 2023-05-22 DIAGNOSIS — A0811 Acute gastroenteropathy due to Norwalk agent: Secondary | ICD-10-CM | POA: Diagnosis not present

## 2023-05-22 DIAGNOSIS — R109 Unspecified abdominal pain: Secondary | ICD-10-CM | POA: Diagnosis not present

## 2023-05-22 DIAGNOSIS — K838 Other specified diseases of biliary tract: Secondary | ICD-10-CM | POA: Diagnosis not present

## 2023-05-22 DIAGNOSIS — Z9049 Acquired absence of other specified parts of digestive tract: Secondary | ICD-10-CM

## 2023-05-22 DIAGNOSIS — E876 Hypokalemia: Secondary | ICD-10-CM | POA: Diagnosis not present

## 2023-05-22 DIAGNOSIS — Z8249 Family history of ischemic heart disease and other diseases of the circulatory system: Secondary | ICD-10-CM

## 2023-05-22 DIAGNOSIS — Z880 Allergy status to penicillin: Secondary | ICD-10-CM

## 2023-05-22 DIAGNOSIS — Z79890 Hormone replacement therapy (postmenopausal): Secondary | ICD-10-CM | POA: Diagnosis not present

## 2023-05-22 DIAGNOSIS — Z79899 Other long term (current) drug therapy: Secondary | ICD-10-CM

## 2023-05-22 DIAGNOSIS — Q453 Other congenital malformations of pancreas and pancreatic duct: Secondary | ICD-10-CM | POA: Diagnosis not present

## 2023-05-22 DIAGNOSIS — I4819 Other persistent atrial fibrillation: Secondary | ICD-10-CM | POA: Diagnosis present

## 2023-05-22 DIAGNOSIS — Z90710 Acquired absence of both cervix and uterus: Secondary | ICD-10-CM

## 2023-05-22 DIAGNOSIS — R1084 Generalized abdominal pain: Principal | ICD-10-CM

## 2023-05-22 DIAGNOSIS — Z96641 Presence of right artificial hip joint: Secondary | ICD-10-CM | POA: Diagnosis present

## 2023-05-22 DIAGNOSIS — K219 Gastro-esophageal reflux disease without esophagitis: Secondary | ICD-10-CM | POA: Diagnosis present

## 2023-05-22 DIAGNOSIS — N3000 Acute cystitis without hematuria: Secondary | ICD-10-CM | POA: Diagnosis present

## 2023-05-22 DIAGNOSIS — Z7901 Long term (current) use of anticoagulants: Secondary | ICD-10-CM

## 2023-05-22 DIAGNOSIS — F32A Depression, unspecified: Secondary | ICD-10-CM | POA: Diagnosis present

## 2023-05-22 DIAGNOSIS — D649 Anemia, unspecified: Secondary | ICD-10-CM | POA: Diagnosis present

## 2023-05-22 DIAGNOSIS — J449 Chronic obstructive pulmonary disease, unspecified: Secondary | ICD-10-CM | POA: Diagnosis present

## 2023-05-22 DIAGNOSIS — C342 Malignant neoplasm of middle lobe, bronchus or lung: Secondary | ICD-10-CM | POA: Diagnosis not present

## 2023-05-22 DIAGNOSIS — Z8673 Personal history of transient ischemic attack (TIA), and cerebral infarction without residual deficits: Secondary | ICD-10-CM | POA: Diagnosis not present

## 2023-05-22 DIAGNOSIS — E785 Hyperlipidemia, unspecified: Secondary | ICD-10-CM | POA: Diagnosis not present

## 2023-05-22 DIAGNOSIS — Z905 Acquired absence of kidney: Secondary | ICD-10-CM

## 2023-05-22 DIAGNOSIS — K8689 Other specified diseases of pancreas: Secondary | ICD-10-CM | POA: Diagnosis not present

## 2023-05-22 DIAGNOSIS — N3090 Cystitis, unspecified without hematuria: Secondary | ICD-10-CM | POA: Diagnosis not present

## 2023-05-22 DIAGNOSIS — I480 Paroxysmal atrial fibrillation: Secondary | ICD-10-CM

## 2023-05-22 DIAGNOSIS — E871 Hypo-osmolality and hyponatremia: Secondary | ICD-10-CM | POA: Diagnosis present

## 2023-05-22 DIAGNOSIS — E8809 Other disorders of plasma-protein metabolism, not elsewhere classified: Secondary | ICD-10-CM | POA: Diagnosis present

## 2023-05-22 DIAGNOSIS — G894 Chronic pain syndrome: Secondary | ICD-10-CM | POA: Diagnosis not present

## 2023-05-22 DIAGNOSIS — B9620 Unspecified Escherichia coli [E. coli] as the cause of diseases classified elsewhere: Secondary | ICD-10-CM | POA: Diagnosis present

## 2023-05-22 DIAGNOSIS — E7800 Pure hypercholesterolemia, unspecified: Secondary | ICD-10-CM | POA: Diagnosis present

## 2023-05-22 DIAGNOSIS — Z823 Family history of stroke: Secondary | ICD-10-CM

## 2023-05-22 DIAGNOSIS — R197 Diarrhea, unspecified: Secondary | ICD-10-CM | POA: Diagnosis not present

## 2023-05-22 DIAGNOSIS — I10 Essential (primary) hypertension: Secondary | ICD-10-CM | POA: Diagnosis not present

## 2023-05-22 DIAGNOSIS — R112 Nausea with vomiting, unspecified: Secondary | ICD-10-CM | POA: Diagnosis not present

## 2023-05-22 DIAGNOSIS — I700 Atherosclerosis of aorta: Secondary | ICD-10-CM | POA: Diagnosis present

## 2023-05-22 DIAGNOSIS — N281 Cyst of kidney, acquired: Secondary | ICD-10-CM | POA: Diagnosis not present

## 2023-05-22 DIAGNOSIS — I722 Aneurysm of renal artery: Secondary | ICD-10-CM | POA: Diagnosis not present

## 2023-05-22 DIAGNOSIS — R000 Tachycardia, unspecified: Secondary | ICD-10-CM | POA: Diagnosis not present

## 2023-05-22 DIAGNOSIS — F411 Generalized anxiety disorder: Secondary | ICD-10-CM | POA: Insufficient documentation

## 2023-05-22 DIAGNOSIS — E039 Hypothyroidism, unspecified: Secondary | ICD-10-CM | POA: Diagnosis not present

## 2023-05-22 DIAGNOSIS — Z87891 Personal history of nicotine dependence: Secondary | ICD-10-CM

## 2023-05-22 DIAGNOSIS — Z981 Arthrodesis status: Secondary | ICD-10-CM

## 2023-05-22 DIAGNOSIS — D1803 Hemangioma of intra-abdominal structures: Secondary | ICD-10-CM | POA: Diagnosis not present

## 2023-05-22 DIAGNOSIS — R509 Fever, unspecified: Secondary | ICD-10-CM | POA: Diagnosis not present

## 2023-05-22 DIAGNOSIS — N3001 Acute cystitis with hematuria: Principal | ICD-10-CM | POA: Diagnosis present

## 2023-05-22 DIAGNOSIS — N3289 Other specified disorders of bladder: Secondary | ICD-10-CM | POA: Diagnosis present

## 2023-05-22 DIAGNOSIS — Z8619 Personal history of other infectious and parasitic diseases: Secondary | ICD-10-CM

## 2023-05-22 LAB — COMPREHENSIVE METABOLIC PANEL
ALT: 12 U/L (ref 0–44)
AST: 16 U/L (ref 15–41)
Albumin: 4.6 g/dL (ref 3.5–5.0)
Alkaline Phosphatase: 82 U/L (ref 38–126)
Anion gap: 12 (ref 5–15)
BUN: 17 mg/dL (ref 8–23)
CO2: 22 mmol/L (ref 22–32)
Calcium: 10 mg/dL (ref 8.9–10.3)
Chloride: 100 mmol/L (ref 98–111)
Creatinine, Ser: 0.96 mg/dL (ref 0.44–1.00)
GFR, Estimated: >60 mL/min (ref >60)
Glucose, Bld: 130 mg/dL — ABNORMAL HIGH (ref 70–99)
Potassium: 3.3 mmol/L — ABNORMAL LOW (ref 3.5–5.1)
Sodium: 134 mmol/L — ABNORMAL LOW (ref 135–145)
Total Bilirubin: 1.6 mg/dL — ABNORMAL HIGH (ref 0.3–1.2)
Total Protein: 7.5 g/dL (ref 6.5–8.1)

## 2023-05-22 LAB — CBC WITH DIFFERENTIAL/PLATELET
Abs Immature Granulocytes: 0.04 10*3/uL (ref 0.00–0.07)
Basophils Absolute: 0 10*3/uL (ref 0.0–0.1)
Basophils Relative: 0 %
Eosinophils Absolute: 0 10*3/uL (ref 0.0–0.5)
Eosinophils Relative: 0 %
HCT: 41 % (ref 36.0–46.0)
Hemoglobin: 14 g/dL (ref 12.0–15.0)
Immature Granulocytes: 1 %
Lymphocytes Relative: 11 %
Lymphs Abs: 0.8 10*3/uL (ref 0.7–4.0)
MCH: 31.3 pg (ref 26.0–34.0)
MCHC: 34.1 g/dL (ref 30.0–36.0)
MCV: 91.7 fL (ref 80.0–100.0)
Monocytes Absolute: 0.5 10*3/uL (ref 0.1–1.0)
Monocytes Relative: 6 %
Neutro Abs: 6 10*3/uL (ref 1.7–7.7)
Neutrophils Relative %: 82 %
Platelets: 292 10*3/uL (ref 150–400)
RBC: 4.47 MIL/uL (ref 3.87–5.11)
RDW: 11.9 % (ref 11.5–15.5)
WBC: 7.4 10*3/uL (ref 4.0–10.5)
nRBC: 0 % (ref 0.0–0.2)

## 2023-05-22 LAB — URINALYSIS, ROUTINE W REFLEX MICROSCOPIC
Bilirubin Urine: NEGATIVE
Glucose, UA: 50 mg/dL — AB
Ketones, ur: 20 mg/dL — AB
Nitrite: NEGATIVE
Protein, ur: 100 mg/dL — AB
RBC / HPF: >50 RBC/hpf (ref 0–5)
Specific Gravity, Urine: 1.018 (ref 1.005–1.030)
WBC, UA: >50 WBC/hpf (ref 0–5)
pH: 6 (ref 5.0–8.0)

## 2023-05-22 LAB — MAGNESIUM: Magnesium: 1.9 mg/dL (ref 1.7–2.4)

## 2023-05-22 LAB — LIPASE, BLOOD: Lipase: 27 U/L (ref 11–51)

## 2023-05-22 MED ORDER — LEVOTHYROXINE SODIUM 88 MCG PO TABS
88.0000 ug | ORAL_TABLET | Freq: Every day | ORAL | Status: DC
  Administered 2023-05-23 – 2023-05-26 (×4): 88 ug via ORAL
  Filled 2023-05-22 (×4): qty 1

## 2023-05-22 MED ORDER — OMEPRAZOLE MAGNESIUM 20 MG PO TBEC: 20.0000 mg | DELAYED_RELEASE_TABLET | Freq: Every day | ORAL | Status: DC

## 2023-05-22 MED ORDER — HYDROMORPHONE HCL 1 MG/ML IJ SOLN
0.5000 mg | Freq: Once | INTRAMUSCULAR | Status: AC
  Administered 2023-05-22: 0.5 mg via INTRAVENOUS
  Filled 2023-05-22: qty 1

## 2023-05-22 MED ORDER — SODIUM CHLORIDE 0.9 % IV BOLUS
1000.0000 mL | Freq: Once | INTRAVENOUS | Status: AC
  Administered 2023-05-22: 1000 mL via INTRAVENOUS

## 2023-05-22 MED ORDER — SODIUM CHLORIDE 0.9% FLUSH: 3.0000 mL | INTRAVENOUS | Status: DC | PRN

## 2023-05-22 MED ORDER — LEVALBUTEROL HCL 0.63 MG/3ML IN NEBU: 0.6300 mg | INHALATION_SOLUTION | Freq: Four times a day (QID) | RESPIRATORY_TRACT | Status: DC | PRN

## 2023-05-22 MED ORDER — IOHEXOL 300 MG/ML  SOLN
100.0000 mL | Freq: Once | INTRAMUSCULAR | Status: AC | PRN
  Administered 2023-05-22: 100 mL via INTRAVENOUS

## 2023-05-22 MED ORDER — POTASSIUM CHLORIDE 10 MEQ/100ML IV SOLN: 10.0000 meq | INTRAVENOUS | Status: DC

## 2023-05-22 MED ORDER — OXYBUTYNIN CHLORIDE 5 MG PO TABS
5.0000 mg | ORAL_TABLET | Freq: Every day | ORAL | Status: DC
  Administered 2023-05-22 – 2023-05-26 (×5): 5 mg via ORAL
  Filled 2023-05-22 (×5): qty 1

## 2023-05-22 MED ORDER — SODIUM CHLORIDE 0.9 % IV SOLN: 250.0000 mL | INTRAVENOUS | Status: DC | PRN

## 2023-05-22 MED ORDER — SODIUM CHLORIDE 0.9 % IV SOLN
1.0000 g | Freq: Once | INTRAVENOUS | Status: AC
  Administered 2023-05-22: 1 g via INTRAVENOUS
  Filled 2023-05-22: qty 10

## 2023-05-22 MED ORDER — ACETAMINOPHEN 325 MG PO TABS
650.0000 mg | ORAL_TABLET | Freq: Four times a day (QID) | ORAL | Status: DC | PRN
  Administered 2023-05-25: 650 mg via ORAL
  Filled 2023-05-22 (×2): qty 2

## 2023-05-22 MED ORDER — BUPROPION HCL 75 MG PO TABS
75.0000 mg | ORAL_TABLET | Freq: Every morning | ORAL | Status: DC
  Administered 2023-05-23 – 2023-05-26 (×4): 75 mg via ORAL
  Filled 2023-05-22 (×4): qty 1

## 2023-05-22 MED ORDER — ONDANSETRON HCL 4 MG/2ML IJ SOLN
4.0000 mg | Freq: Four times a day (QID) | INTRAMUSCULAR | Status: DC | PRN
  Administered 2023-05-22 – 2023-05-23 (×2): 4 mg via INTRAVENOUS
  Filled 2023-05-22 (×3): qty 2

## 2023-05-22 MED ORDER — ACETAMINOPHEN 650 MG RE SUPP: 650.0000 mg | Freq: Four times a day (QID) | RECTAL | Status: DC | PRN

## 2023-05-22 MED ORDER — SODIUM CHLORIDE 0.9 % IV SOLN
1.0000 g | INTRAVENOUS | Status: AC
  Administered 2023-05-23 – 2023-05-26 (×4): 1 g via INTRAVENOUS
  Filled 2023-05-22 (×5): qty 10

## 2023-05-22 MED ORDER — PANTOPRAZOLE SODIUM 40 MG PO TBEC
40.0000 mg | DELAYED_RELEASE_TABLET | Freq: Every day | ORAL | Status: DC
  Administered 2023-05-23 – 2023-05-26 (×4): 40 mg via ORAL
  Filled 2023-05-22 (×4): qty 1

## 2023-05-22 MED ORDER — ATORVASTATIN CALCIUM 10 MG PO TABS
10.0000 mg | ORAL_TABLET | Freq: Every day | ORAL | Status: DC
  Administered 2023-05-23 – 2023-05-26 (×4): 10 mg via ORAL
  Filled 2023-05-22 (×4): qty 1

## 2023-05-22 MED ORDER — SODIUM CHLORIDE 0.9% FLUSH
3.0000 mL | Freq: Two times a day (BID) | INTRAVENOUS | Status: DC
  Administered 2023-05-22 – 2023-05-26 (×5): 3 mL via INTRAVENOUS

## 2023-05-22 MED ORDER — SODIUM CHLORIDE 0.9 % IV SOLN: 1.0000 g | INTRAVENOUS | Status: DC

## 2023-05-22 MED ORDER — ONDANSETRON HCL 4 MG PO TABS
4.0000 mg | ORAL_TABLET | Freq: Four times a day (QID) | ORAL | Status: DC | PRN
  Administered 2023-05-24: 4 mg via ORAL
  Filled 2023-05-22: qty 1

## 2023-05-22 MED ORDER — FLUOXETINE HCL 20 MG PO CAPS
20.0000 mg | ORAL_CAPSULE | Freq: Every day | ORAL | Status: DC
  Administered 2023-05-22: 20 mg via ORAL
  Filled 2023-05-22: qty 1

## 2023-05-22 MED ORDER — APIXABAN 5 MG PO TABS
5.0000 mg | ORAL_TABLET | Freq: Two times a day (BID) | ORAL | Status: DC
  Administered 2023-05-22 – 2023-05-26 (×8): 5 mg via ORAL
  Filled 2023-05-22 (×8): qty 1

## 2023-05-22 MED ORDER — HYDROMORPHONE HCL 1 MG/ML IJ SOLN
1.0000 mg | Freq: Once | INTRAMUSCULAR | Status: AC
  Administered 2023-05-22: 1 mg via INTRAVENOUS
  Filled 2023-05-22: qty 1

## 2023-05-22 MED ORDER — HYDROMORPHONE HCL 1 MG/ML IJ SOLN
1.0000 mg | INTRAMUSCULAR | Status: DC | PRN
  Administered 2023-05-23 – 2023-05-26 (×4): 1 mg via INTRAVENOUS
  Filled 2023-05-22 (×4): qty 1

## 2023-05-22 MED ORDER — KCL-LACTATED RINGERS-D5W 20 MEQ/L IV SOLN
INTRAVENOUS | Status: AC
  Filled 2023-05-22 (×3): qty 1000

## 2023-05-22 NOTE — ED Triage Notes (Signed)
EMS reports from home, Pt c/o increasing abdominal pain x 3 days. Hs of UTI  BP 140/70 RR 20 HR 115 Sp02 98 RA CBG 152  20ga L hand
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
## 2023-05-22 NOTE — ED Provider Notes (Signed)
[facility] Provider Note   CSN: [id number] Arrival date & time: 05/22/23  1623     History  Chief Complaint  Patient presents with   Abdominal Pain    [name] is a 77 y.o. female coming from home complaining of abdominal pain.  Patient reports onset of lower abdominal pain yesterday.  She is a poor historian, but she claims she has had this kind of pain before, but it is more severe.  She cannot describe it further.  She denies vomiting.  She reports a bowel movement yesterday.  EMS reports a history of UTIs.  Patient and normal vital signs aside from tachycardia with EMS.  Patient's daughter later told me that the patient was complaining of severe pain for the past 24 hours, persistent nausea and vomiting.  She has had episodes in the past with severe abdominal pain which are felt to be related to recurring UTIs.  She has been in intermittent antibiotics by her urologist but not currently taking any.  She unfortunately also had a history of C. difficile colitis in April.  HPI     Home Medications Prior to Admission medications   Medication Sig Start Date End Date Taking? Authorizing Provider  acetaminophen (TYLENOL) 325 MG tablet Take 2 tablets (650 mg total) by mouth every 8 (eight) hours. Patient taking differently: Take 650 mg by mouth in the morning and at bedtime. 02/18/19  Yes Love, Evlyn Kanner, PA-C  apixaban (ELIQUIS) 5 MG TABS tablet Take 1 tablet (5 mg total) by mouth 2 (two) times daily. 05/09/23  Yes [place], Peter M, MD  ascorbic acid (VITAMIN C) 500 MG tablet Take 500 mg by mouth daily.   Yes [provider]  atorvastatin (LIPITOR) 10 MG tablet Take 1 tablet (10 mg total) by mouth daily. 02/18/19  Yes Love, Evlyn Kanner, PA-C  bisoprolol (ZEBETA) 5 MG tablet Take 2.5 mg by mouth daily. 03/11/23  Yes [provider]  buPROPion (WELLBUTRIN) 75 MG tablet Take 75 mg by mouth every morning. 12/31/20  Yes [provider]  Cholecalciferol (VITAMIN D) 125 MCG (5000 UT) CAPS Take 5,000 Units by mouth daily.   Yes [provider]  FLUoxetine (PROZAC) 10 MG capsule Take 10 mg by mouth at bedtime.   Yes [provider]  FLUoxetine (PROZAC) 20 MG capsule Take 1 capsule (20 mg total) by mouth at bedtime. 02/18/19  Yes Love, Evlyn Kanner, PA-C  furosemide (LASIX) 20 MG tablet Take 1 tablet (20 mg total) by mouth as needed. Patient taking differently: Take 20 mg by mouth in the morning. 06/09/21  Yes Juanda Crumble K, PA-C  gabapentin (NEURONTIN) 600 MG tablet Take 600 mg by mouth in the morning and at bedtime.   Yes [provider]  levothyroxine (SYNTHROID) 88 MCG tablet Take 1 tablet (88 mcg total) by mouth daily before breakfast. 03/13/19  Yes [place], Peter M, MD  loratadine (CLARITIN) 10 MG tablet Take 10 mg by mouth daily.   Yes [provider]  methenamine (HIPREX) 1 g tablet Take 1 g by mouth 2 (two) times daily. 05/22/23  Yes [provider]  omeprazole (PRILOSEC OTC) 20 MG tablet Take 20 mg by mouth daily.   Yes [provider]  ondansetron (ZOFRAN) 4 MG tablet Take 1 tablet (4 mg total) by mouth every 6 (six) hours. 12/07/22  Yes Beatty, Celeste A, PA-C      Allergies    Penicillins  Review of Systems   Review of Systems  Physical Exam Updated Vital Signs BP (!) 125/107 (BP Location: Left Arm)   Pulse (!) 101   Temp 98.9 F (37.2 C) (Oral)   Resp 20   SpO2 97%  Physical Exam Constitutional:      General: She is in acute distress.  HENT:     Head: Normocephalic and atraumatic.  Eyes:     Conjunctiva/sclera: Conjunctivae normal.     Pupils: Pupils are equal, round, and reactive to light.  Cardiovascular:     Rate and Rhythm: Regular rhythm. Tachycardia present.  Pulmonary:     Effort: Pulmonary effort is normal. No respiratory distress.  Abdominal:     Tenderness: There is generalized abdominal tenderness.  Skin:    General:  Skin is warm and dry.  Neurological:     General: No focal deficit present.     Mental Status: She is alert. Mental status is at baseline.  Psychiatric:        Mood and Affect: Mood normal.        Behavior: Behavior normal.     ED Results / Procedures / Treatments   Labs (all labs ordered are listed, but only abnormal results are displayed) Labs Reviewed  COMPREHENSIVE METABOLIC PANEL - Abnormal; Notable for the following components:      Result Value   Sodium 134 (*)    Potassium 3.3 (*)    Glucose, Bld 130 (*)    Total Bilirubin 1.6 (*)    All other components within normal limits  URINALYSIS, ROUTINE W REFLEX MICROSCOPIC - Abnormal; Notable for the following components:   APPearance CLOUDY (*)    Glucose, UA 50 (*)    Hgb urine dipstick MODERATE (*)    Ketones, ur 20 (*)    Protein, ur 100 (*)    Leukocytes,Ua LARGE (*)    Bacteria, UA RARE (*)    All other components within normal limits  URINE CULTURE  GASTROINTESTINAL PANEL BY PCR, STOOL (REPLACES STOOL CULTURE)  C DIFFICILE QUICK SCREEN W PCR REFLEX    CBC WITH DIFFERENTIAL/PLATELET  LIPASE, BLOOD  MAGNESIUM  COMPREHENSIVE METABOLIC PANEL  CBC    EKG EKG Interpretation Date/Time:  Tuesday May 22 2023 17:03:11 EDT Ventricular Rate:  125 PR Interval:    QRS Duration:  116 QT Interval:  365 QTC Calculation: 527 R Axis:   86  Text Interpretation: Atrial fibrillation Right bundle branch block Confirmed by Alvester Chou [phone]) on 05/22/2023 5:05:55 PM  Radiology CT ABDOMEN PELVIS W CONTRAST  Result Date: 05/22/2023 CLINICAL DATA:  Increasing right lower quadrant abdominal pain for the past 3 days. EXAM: CT ABDOMEN AND PELVIS WITH CONTRAST TECHNIQUE: Multidetector CT imaging of the abdomen and pelvis was performed using the standard protocol following bolus administration of intravenous contrast. RADIATION DOSE REDUCTION: This exam was performed according to the departmental dose-optimization program which  includes automated exposure control, adjustment of the mA and/or kV according to patient size and/or use of iterative reconstruction technique. CONTRAST:  OMNIPAQUE IOHEXOL 300 MG/ML  SOLN COMPARISON:  Nuclear medicine PET-CT dated 01/29/2023. Abdomen and pelvis CT dated 12/07/2022. FINDINGS: Lower chest: Mildly enlarged heart. Minimal linear scarring at the right lung base without significant change. Minimal dependent atelectasis at the posterior right lung base. Hepatobiliary: Stable low-density mass with coarse calcifications at the lateral aspect of the lateral segment of the left lobe of the liver. This previously felt to represent an hemangioma. Additional hemangioma and cysts are  unchanged. Cholecystectomy clips. Mildly progressive extrahepatic and intrahepatic biliary ductal dilatation with common duct diameter of 14.5 mm, previously 11.7 mm. Pancreas: Stable pancreatic ductal dilatation, measuring 4.6 mm in diameter, previously 4.5 mm. Spleen: Normal in size without focal abnormality. Adrenals/Urinary Tract: Normal-appearing adrenal glands. Small left renal simple appearing cyst without significant change. This does not need imaging follow-up. The right kidney is not visualized. The urinary bladder is obscured by streak artifacts from a right hip prosthesis. There is a suggestion of mild-to-moderate diffuse bladder wall thickening with mucosal enhancement. No visible left ureteral abnormality. Stomach/Bowel: Unremarkable stomach, small bowel and colon. Surgically absent appendix. 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 No enlarged lymph nodes. Reproductive: Status post hysterectomy. No adnexal masses. Other: No abdominal wall hernia or abnormality. No abdominopelvic ascites. 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 Interbody and pedicle screw and rod fusion at the L3-4 level with normal  alignment. Mild lumbar and lower thoracic spine degenerative changes. IMPRESSION: 1. Mildly progressive biliary ductal dilatation. Correlation with liver function tests is recommended. 2. Stable pancreatic ductal dilatation. 3. Poorly visualized urinary bladder due to streak artifacts with a suggestion of mild-to-moderate diffuse bladder wall thickening with mucosal enhancement, suspicious for cystitis. 4. 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 5. Mild cardiomegaly. Electronically Signed   By: Beckie Salts M.D.   On: 05/22/2023 19:56    Procedures Procedures    Medications Ordered in ED Medications  atorvastatin (LIPITOR) tablet 10 mg (has no administration in time range)  buPROPion (WELLBUTRIN) tablet 75 mg (has no administration in time range)  FLUoxetine (PROZAC) capsule 20 mg (has no administration in time range)  levothyroxine (SYNTHROID) tablet 88 mcg (has no administration in time range)  omeprazole (PRILOSEC OTC) EC tablet 20 mg (has no administration in time range)  apixaban (ELIQUIS) tablet 5 mg (has no administration in time range)  levalbuterol (XOPENEX) nebulizer solution 0.63 mg (has no administration in time range)  sodium chloride flush (NS) 0.9 % injection 3 mL (has no administration in time range)  sodium chloride flush (NS) 0.9 % injection 3 mL (has no administration in time range)  0.9 %  sodium chloride infusion (has no administration in time range)  dextrose 5% in lactated ringers with KCl 20 mEq/L infusion (has no administration in time range)  acetaminophen (TYLENOL) tablet 650 mg (has no administration in time range)    Or  acetaminophen (TYLENOL) suppository 650 mg (has no administration in time range)  HYDROmorphone (DILAUDID) injection 1 mg (has no administration in time range)  ondansetron (ZOFRAN) tablet 4 mg (has no administration in time range)    Or  ondansetron (ZOFRAN) injection 4 mg (has no administration in time range)  oxybutynin  (DITROPAN) tablet 5 mg (has no administration in time range)  cefTRIAXone (ROCEPHIN) 1 g in sodium chloride 0.9 % 100 mL IVPB (has no administration in time range)  HYDROmorphone (DILAUDID) injection 1 mg (1 mg Intravenous Given 05/22/23 1650)  sodium chloride 0.9 % bolus 1,000 mL (1,000 mLs Intravenous New Bag/Given 05/22/23 1650)  iohexol (OMNIPAQUE) 300 MG/ML solution 100 mL (100 mLs Intravenous Contrast Given 05/22/23 1757)  cefTRIAXone (ROCEPHIN) 1 g in sodium chloride 0.9 % 100 mL IVPB (0 g Intravenous Stopped 05/22/23 2056)  HYDROmorphone (DILAUDID) injection 0.5 mg (0.5 mg Intravenous Given 05/22/23 1953)    ED Course/ Medical Decision Making/ A&P Clinical Course as of 05/22/23 2228  Tue May 22, 2023  2103 Admitted to hospitalist [MT]    Clinical Course User Index [MT] Malky Rudzinski, Kermit Balo, MD                                 Medical Decision Making Amount and/or Complexity of Data Reviewed Labs: ordered. Radiology: ordered.  Risk Prescription drug management. Decision regarding hospitalization.   This patient presents to the ED with concern for lower abdominal pain. This involves an extensive number of treatment options, and is a complaint that carries with it a high risk of complications and morbidity.  The differential diagnosis includes recurrent UTI versus interstitial cystitis versus ureteral colic versus colitis versus diverticulitis versus other  Co-morbidities that complicate the patient evaluation: History of chronic cystitis, C. difficile  Additional history obtained from EMS as well as the patient's daughter and husband at bedside  External records from outside source obtained and reviewed including C. difficile positive result in April of this year  I ordered and personally interpreted labs.  The pertinent results include: Basic labs unremarkable.  LFT and lipase within normal limits.  UA with moderate hemoglobin, cloudy urine, large leukocytes, negative nitrites, per  catheterized sample  I ordered imaging studies including CT abdomen pelvis I independently visualized and interpreted imaging which showed potential bladder wall thickening, streak motion artifact, chronic biliary dilatation I agree with the radiologist interpretation  The patient was maintained on a cardiac monitor.  I personally viewed and interpreted the cardiac monitored which showed an underlying rhythm of: A-fib that is rate controlled  Per my interpretation the patient's ECG shows A-fib with no acute ischemic findings  I ordered medication including IV pain medication, IV Rocephin for UTI  I have reviewed the patients home medicines and have made adjustments as needed   After the interventions noted above, I reevaluated the patient and found that they have: improved  On my reassessment the patient continues to have significant pain and is quite uncomfortable, despite some improvement with the Dilaudid.  Her family does report that this type her trajectory could be typical with her UTIs, but expressed concern because of her nausea and vomiting and inability to control her pain at home.  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  Given this clinical setting, we'll admit to the hospitalist to ensure improvement of the patient's pain with antibiotics.  Dispostion:  After consideration of the diagnostic results and the patients response to treatment, I feel that the patent would benefit from medical admission.         Final Clinical Impression(s) / ED Diagnoses Final diagnoses:  Generalized abdominal pain  Cystitis    Rx / DC Orders ED Discharge Orders     None         Terald Sleeper, MD 05/22/23 2228
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
## 2023-05-23 DIAGNOSIS — E876 Hypokalemia: Secondary | ICD-10-CM | POA: Diagnosis not present

## 2023-05-23 DIAGNOSIS — R109 Unspecified abdominal pain: Secondary | ICD-10-CM | POA: Diagnosis not present

## 2023-05-23 DIAGNOSIS — R197 Diarrhea, unspecified: Secondary | ICD-10-CM | POA: Diagnosis not present

## 2023-05-23 DIAGNOSIS — G894 Chronic pain syndrome: Secondary | ICD-10-CM

## 2023-05-23 DIAGNOSIS — N3000 Acute cystitis without hematuria: Secondary | ICD-10-CM | POA: Diagnosis not present

## 2023-05-23 LAB — COMPREHENSIVE METABOLIC PANEL
ALT: 12 U/L (ref 0–44)
AST: 16 U/L (ref 15–41)
Albumin: 3.7 g/dL (ref 3.5–5.0)
Alkaline Phosphatase: 71 U/L (ref 38–126)
Anion gap: 12 (ref 5–15)
BUN: 18 mg/dL (ref 8–23)
CO2: 22 mmol/L (ref 22–32)
Calcium: 9.6 mg/dL (ref 8.9–10.3)
Chloride: 103 mmol/L (ref 98–111)
Creatinine, Ser: 1.13 mg/dL — ABNORMAL HIGH (ref 0.44–1.00)
GFR, Estimated: 50 mL/min — ABNORMAL LOW (ref >60)
Glucose, Bld: 118 mg/dL — ABNORMAL HIGH (ref 70–99)
Potassium: 3.4 mmol/L — ABNORMAL LOW (ref 3.5–5.1)
Sodium: 137 mmol/L (ref 135–145)
Total Bilirubin: 0.8 mg/dL (ref 0.3–1.2)
Total Protein: 6.7 g/dL (ref 6.5–8.1)

## 2023-05-23 LAB — GASTROINTESTINAL PANEL BY PCR, STOOL (REPLACES STOOL CULTURE)

## 2023-05-23 LAB — CBC
HCT: 38.6 % (ref 36.0–46.0)
Hemoglobin: 12.9 g/dL (ref 12.0–15.0)
MCH: 31.6 pg (ref 26.0–34.0)
MCHC: 33.4 g/dL (ref 30.0–36.0)
MCV: 94.6 fL (ref 80.0–100.0)
Platelets: 278 10*3/uL (ref 150–400)
RBC: 4.08 MIL/uL (ref 3.87–5.11)
RDW: 12.2 % (ref 11.5–15.5)
WBC: 9.6 10*3/uL (ref 4.0–10.5)
nRBC: 0 % (ref 0.0–0.2)

## 2023-05-23 LAB — C DIFFICILE QUICK SCREEN W PCR REFLEX
C Diff antigen: NEGATIVE
C Diff interpretation: NOT DETECTED
C Diff toxin: NEGATIVE

## 2023-05-23 MED ORDER — POTASSIUM CHLORIDE CRYS ER 20 MEQ PO TBCR
40.0000 meq | EXTENDED_RELEASE_TABLET | Freq: Two times a day (BID) | ORAL | Status: DC
  Filled 2023-05-23: qty 2

## 2023-05-23 MED ORDER — TRAMADOL HCL 50 MG PO TABS
50.0000 mg | ORAL_TABLET | Freq: Four times a day (QID) | ORAL | Status: DC | PRN
  Administered 2023-05-23 – 2023-05-25 (×5): 50 mg via ORAL
  Filled 2023-05-23 (×5): qty 1

## 2023-05-23 MED ORDER — GABAPENTIN 300 MG PO CAPS
600.0000 mg | ORAL_CAPSULE | Freq: Every day | ORAL | Status: DC
  Administered 2023-05-23 – 2023-05-25 (×3): 600 mg via ORAL
  Filled 2023-05-23 (×3): qty 2

## 2023-05-23 MED ORDER — ORAL CARE MOUTH RINSE: 15.0000 mL | OROMUCOSAL | Status: DC | PRN

## 2023-05-23 MED ORDER — FLUOXETINE HCL 20 MG PO CAPS
30.0000 mg | ORAL_CAPSULE | Freq: Every day | ORAL | Status: DC
  Administered 2023-05-23 – 2023-05-25 (×3): 30 mg via ORAL
  Filled 2023-05-23 (×3): qty 1

## 2023-05-23 MED ORDER — BISOPROLOL FUMARATE 5 MG PO TABS
2.5000 mg | ORAL_TABLET | Freq: Every day | ORAL | Status: DC
  Administered 2023-05-23 – 2023-05-26 (×4): 2.5 mg via ORAL
  Filled 2023-05-23 (×4): qty 1

## 2023-05-23 MED ORDER — SODIUM CHLORIDE 0.9% FLUSH
10.0000 mL | Freq: Two times a day (BID) | INTRAVENOUS | Status: DC
  Administered 2023-05-24 – 2023-05-26 (×3): 10 mL

## 2023-05-23 MED ORDER — SODIUM CHLORIDE 0.9% FLUSH: 10.0000 mL | INTRAVENOUS | Status: DC | PRN

## 2023-05-23 MED ORDER — POLYVINYL ALCOHOL 1.4 % OP SOLN
1.0000 [drp] | OPHTHALMIC | Status: DC | PRN
  Administered 2023-05-24: 1 [drp] via OPHTHALMIC
  Filled 2023-05-23: qty 15

## 2023-05-23 MED ORDER — FLUOXETINE HCL 10 MG PO CAPS: 10.0000 mg | ORAL_CAPSULE | Freq: Every day | ORAL | Status: DC

## 2023-05-23 MED ORDER — DIPHENHYDRAMINE HCL 25 MG PO CAPS
25.0000 mg | ORAL_CAPSULE | Freq: Four times a day (QID) | ORAL | Status: DC | PRN
  Administered 2023-05-23: 25 mg via ORAL
  Filled 2023-05-23: qty 1

## 2023-05-23 MED ORDER — POTASSIUM CHLORIDE 20 MEQ PO PACK
40.0000 meq | PACK | Freq: Two times a day (BID) | ORAL | Status: AC
  Administered 2023-05-23 (×2): 40 meq via ORAL
  Filled 2023-05-23 (×2): qty 2

## 2023-05-23 NOTE — Progress Notes (Signed)
A midline has been ordered for this patient. The [facility] RN has reviewed the patient's medical record including any arm restrictions, current creatinine clearance, length IV therapy is needed, and infusions needed/ordered to determine if a midline is the appropriate line for this individual patient. If there are contraindications, the physician and primary RN has been contacted by [facility] RN for further discussion. °Midline Education: °a midline is a long peripheral IV placed in the upper arm with the tip located at or near the axilla and distal to the shoulder °should not be used as a CLABSI preventative measure   °it has one lumen only °it can remain in place for up to 29 days  °Safe for Vancomycin infusion LESS THAN 6 days °Is safe for power injection if good blood return can be obtained and line flushes easily °it CANNOT be used for continuous infusion of vesicants. Including TPN and chemotherapy °Should NOT be placed for sole intent of obtaining labs as there is no guarantee blood can be successfully drawn from line  °contraindicated in patients with thrombosis, hypercoagulability, decreased venous flow to the extremities, ESRD (without a nephrologist's approval), small vessels, allergy to polyurethane, or known/suspected presence of a device-related infection, bacteremia, or septicemia  °

## 2023-05-23 NOTE — Progress Notes (Signed)
PROGRESS NOTE    [name]  ZOX:[id number] DOB: [birth year] DOA: 05/22/2023 PCP: Chilton Greathouse, MD   Brief Narrative:  HPI Per Dr. Tereasa Coop on 05/22/23:  [name] is a 77 y.o. 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  Patient reported that she has generalized abdominal pain and mostly on the right side and she has bladder spasm as well.  Patient reported having nausea, vomiting and diarrhea for last 2 days.  She has 7-10 episodes of small-volume stool.  Abdominal pain is a spasmodic in quality, and there is no associated relieving or aggravating factor.  Patient also has poor oral tolerance and vomiting for last 2 days as well.  No family member at home is sick at this time.  Denies any eating outside seafood or old food from the fridge.  Denies any recent use of antibiotic. Patient is complaining about bladder spasm with associated dysuria.  She denies any fever and chill however she has generalized weakness and poor oral tolerance. Denies any other complaint at this time.  **Interim History GI Pathogen Panel Positive of EPEC. Continuing IV Ceftriaxone. Discussed with GI for further evaluation and they will work up biliary ductal dilatation with outpatient EUS.    Assessment and Plan:  Acute Cystitis -Patient presenting with complaining of bladder spasm, dysuria and increased urinary urgency for last 3 to 4 days.  Patient has severe bladder spasm and pain.  Patient also has diarrhea, nausea and vomiting and poor oral tolerance. -In the ER presentation patient is hemodynamically stable. -CBC no evidence of leukocytosis. -UA showed evidence of UTI.  Pending urine cultures. -In the  ED patient has been given IV ceftriaxone, Dilaudid 1.5 mg and 1 L of NS bolus.   -Per chart review patient has UTI in April 2024 while she was treating with ceftriaxone also found to have concomitant infection with C. difficile and ceftriaxone has been transitioned to oral vancomycin. -Currently treating patient empirically with IV ceftriaxone.  Checking C. difficile panel.  If C. difficile comes back positive in that case please stop the cephalosporin and need to start oral vancomycin as per patient's p.o. intake tolerance. -Continue to monitor fever curve WBC. -Will follow-up with urine culture for appropriate antibiotic guidance and pending. -Starting oxybutynin 5 mg daily. -Continue to monitor urine output.   Abdominal Pain Nausea and Vomiting Diarrhea in the setting of EPEC -Patient reported generalized abdominal pain with most pain to her right upper quadrant and suprapubic pain.  She is also complaining about nausea, vomiting and diarrhea for last 2 days.  She is afebrile.  No leukocytosis.  Per chart review patient had C. difficile associated diarrhea in April 2024.  Possibly patient might have another episode now however I have low suspicion now given the nature of acute onset of symptoms likely viral gastroenteritis origin. -CBC no evidence of leukocytosis and CMP did not showed any evidence of AKI -CT abdomen pelvis: Showed mildly progressive biliary ductal dilation and a stable pancreatic duct dilation. -Checking GI panel and C. 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 -Chronic abdominal pain with secondary to  underlying chronic biliary ductal dilation and chronic pancreatic duct dilation. -Continue Dilaudid as needed and will add low-dose tramadol -Continue Zofran as needed -GI evaluated patient CT scan recommending outpatient follow-up  with a EUS    History of chronic pancreatic dilation Pancreatic divisum History of cholecystectomy -CT abdomen pelvis showed progressive biliary ductal dilation and stable pancreatic dilation.  Per chart review of GI note expected postsurgical dilation of the common bile duct. -Normal hepatic function panel and normal lipase level. -Continue to monitor liver function.    History of Schatzki ring History of esophageal dysmotility History of chronic abdominal pain Dilation of the common bile duct Dilation of the pancreatic duct -While patient was admitted in April she has been evaluated by [facility] Dr. Erik Obey.  Per chart review of the note EGD from 2021 showed tortuous esophagus, small hiatal hernia with partial Schatzki ring and chronic gastritis without evidence of H. pylori and dyspepsia.  Barium swallow study March 2023 showed significant esophageal dysmotility related to aging without evidence of stricture.  Patient has been previously seen at [facility] for nausea vomiting and she is on Zofran at home as needed. -Continue dysphagia level 3 diet.   Hypokalemia -Patient's K+ Level Trend: Recent Labs  Lab 05/22/23 1639 05/23/23 0505  K 3.3* 3.4*  -Was on D5 in LR + 20 mEQ at 75 mL/hr but stopped due to infiltration -Replete with po KCL 40 mEQ Pakcets BID -Continue to Monitor and Replete as Necessary -Repeat CMP in the AM   Hyponatremia -Serum sodium 134.  Mild hyponatremia in the setting of poor oral intake. -Na+ Trend:  Recent Labs  Lab 05/22/23 1639 05/23/23 0505  NA 134* 137  -Continue to monitor sodium level   Paroxysmal atrial fibrillation -EKG showing atrial fibrillation heart rate 125. -Resumed home bisoprolol 25 mg daily and continue Eliquis 5 mg twice daily.   Essential hypertension -Continue Bisoprolol 2.5 mg daily.  Holding Lasix as patient is euvolemic on physical exam.   Hypothyroidism -Continue Levothyroxine   Chronic pain syndrome -Patient has  history of chronic syndrome.  She used to be on chronic fentanyl patch which has been gradually weaned off in April 2024. -Currently she takes gabapentin 600 mg at bedtime. Continuing gabapentin.   History of CVA -Patient is not on Aspirin unclear etiology. -Continue Eliquis 5 mg twice daily (also for management of PACs Mantel fibrillation) -Continue Lipitor 10 mg daily.   Generalized anxiety disorder -Continue Bupropion 75 mg daily, Prozac 10 mg daily   GERD -Continue Omeprazole 20 mg daily substitution with Pantoprazole   DVT prophylaxis: SCDs Start: 05/22/23 2101 Place TED hose Start: 05/22/23 2101 apixaban (ELIQUIS) tablet 5 mg    Code Status: Full Code Family Communication: Discussed with Daughter and Husband at bedside  Disposition Plan:  Level of care: Telemetry Status is: Inpatient Remains inpatient appropriate because: Needs further clinical improvement   Consultants:  Discussed with GI  Procedures:  As delineated as above  Antimicrobials:  Anti-infectives (From admission, onward)    Start     Dose/Rate Route Frequency Ordered Stop   05/23/23 2000  cefTRIAXone (ROCEPHIN) 1 g in sodium chloride 0.9 % 100 mL IVPB  Status:  Discontinued        1 g 200 mL/hr over 30 Minutes Intravenous Every 24 hours 05/22/23 2100 05/22/23 2114   05/23/23 2000  cefTRIAXone (ROCEPHIN) 1 g in sodium chloride 0.9 % 100 mL IVPB        1 g 200 mL/hr over  30 Minutes Intravenous Every 24 hours 05/22/23 2131 05/29/23 1959   05/22/23 1945  cefTRIAXone (ROCEPHIN) 1 g in sodium chloride 0.9 % 100 mL IVPB        1 g 200 mL/hr over 30 Minutes Intravenous  Once 05/22/23 1932 05/22/23 2056       Subjective: Seen and examined at bedside still having some abdominal pain and discomfort and also having some nausea vomiting and diarrhea.  No other concerns or complaints this time but did not feel very well.  Objective: Vitals:   05/23/23 0808 05/23/23 1006 05/23/23 1433 05/23/23 1932  BP:  114/74 114/69 104/72 119/68  Pulse: 94 95 77 88  Resp: 14 16 18 16   Temp: 98.4 F (36.9 C) 99.4 F (37.4 C) 98.2 F (36.8 C) 98.4 F (36.9 C)  TempSrc: Oral Oral Oral Oral  SpO2: 100% 98% 100% 97%  Weight:        Intake/Output Summary (Last 24 hours) at 05/23/2023 2007 Last data filed at 05/23/2023 1918 Gross per 24 hour  Intake 1306.97 ml  Output 400 ml  Net 906.97 ml   Filed Weights   05/22/23 2200 05/23/23 0444  Weight: 64 kg 64 kg   Examination: Physical Exam:  Constitutional: Chronically ill-appearing elderly Caucasian female in no acute distress Respiratory: Diminished to auscultation bilaterally, no wheezing, rales, rhonchi or crackles. Normal respiratory effort and patient is not tachypenic. No accessory muscle use.  Unlabored breathing Cardiovascular: RRR, no murmurs / rubs / gallops. S1 and S2 auscultated. No extremity edema.  Abdomen: Soft, tender to palpate and slightly distended.  Bowel sounds positive.  GU: Deferred. Musculoskeletal: No clubbing / cyanosis of digits/nails. No joint deformity upper and lower extremities. Skin: No rashes, lesions, ulcers on limited skin evaluation. No induration; Warm and dry.  Neurologic: CN 2-12 grossly intact with no focal deficits. Romberg sign and cerebellar reflexes not assessed.  Psychiatric: Normal judgment and insight. Alert and oriented x 3. Normal mood and appropriate affect.   Data Reviewed: I have personally reviewed following labs and imaging studies  CBC: Recent Labs  Lab 05/22/23 1639 05/23/23 0505  WBC 7.4 9.6  NEUTROABS 6.0  --   HGB 14.0 12.9  HCT 41.0 38.6  MCV 91.7 94.6  PLT 292 278   Basic Metabolic Panel: Recent Labs  Lab 05/22/23 1639 05/23/23 0505  NA 134* 137  K 3.3* 3.4*  CL 100 103  CO2 22 22  GLUCOSE 130* 118*  BUN 17 18  CREATININE 0.96 1.13*  CALCIUM 10.0 9.6  MG 1.9  --    GFR: Estimated Creatinine Clearance: 36.9 mL/min (A) (by C-G formula based on SCr of 1.13 mg/dL  (H)). Liver Function Tests: Recent Labs  Lab 05/22/23 1639 05/23/23 0505  AST 16 16  ALT 12 12  ALKPHOS 82 71  BILITOT 1.6* 0.8  PROT 7.5 6.7  ALBUMIN 4.6 3.7   Recent Labs  Lab 05/22/23 1639  LIPASE 27   No results for input(s): "AMMONIA" in the last 168 hours. Coagulation Profile: No results for input(s): "INR", "PROTIME" in the last 168 hours. Cardiac Enzymes: No results for input(s): "CKTOTAL", "CKMB", "CKMBINDEX", "TROPONINI" in the last 168 hours. BNP (last 3 results) No results for input(s): "PROBNP" in the last 8760 hours. HbA1C: No results for input(s): "HGBA1C" in the last 72 hours. CBG: No results for input(s): "GLUCAP" in the last 168 hours. Lipid Profile: No results for input(s): "CHOL", "HDL", "LDLCALC", "TRIG", "CHOLHDL", "LDLDIRECT" in the last 72 hours. Thyroid  Function Tests: No results for input(s): "TSH", "T4TOTAL", "FREET4", "T3FREE", "THYROIDAB" in the last 72 hours. Anemia Panel: No results for input(s): "VITAMINB12", "FOLATE", "FERRITIN", "TIBC", "IRON", "RETICCTPCT" in the last 72 hours. Sepsis Labs: No results for input(s): "PROCALCITON", "LATICACIDVEN" in the last 168 hours.  Recent Results (from the past 240 hour(s))  Gastrointestinal Panel by PCR , Stool     Status: Abnormal   Collection Time: 05/23/23 12:09 AM   Specimen: Stool  Result Value Ref Range Status   Campylobacter species NOT DETECTED NOT DETECTED Final   Plesimonas shigelloides NOT DETECTED NOT DETECTED Final   Salmonella species NOT DETECTED NOT DETECTED Final   Yersinia enterocolitica NOT DETECTED NOT DETECTED Final   Vibrio species NOT DETECTED NOT DETECTED Final   Vibrio cholerae NOT DETECTED NOT DETECTED Final   Enteroaggregative E coli (EAEC) NOT DETECTED NOT DETECTED Final   Enteropathogenic E coli (EPEC) DETECTED (A) NOT DETECTED Final    Comment: RESULT CALLED TO, READ BACK BY AND VERIFIED WITH: REBECCA FEWELL 05/23/23 1423 MW    Enterotoxigenic E coli (ETEC) NOT  DETECTED NOT DETECTED Final   Shiga like toxin producing E coli (STEC) NOT DETECTED NOT DETECTED Final   Shigella/Enteroinvasive E coli (EIEC) NOT DETECTED NOT DETECTED Final   Cryptosporidium NOT DETECTED NOT DETECTED Final   Cyclospora cayetanensis NOT DETECTED NOT DETECTED Final   Entamoeba histolytica NOT DETECTED NOT DETECTED Final   Giardia lamblia NOT DETECTED NOT DETECTED Final   Adenovirus F40/41 NOT DETECTED NOT DETECTED Final   Astrovirus NOT DETECTED NOT DETECTED Final   Norovirus GI/GII DETECTED (A) NOT DETECTED Final    Comment: RESULT CALLED TO, READ BACK BY AND VERIFIED WITH: REBECCA FEWELL 05/22/22 1423 MW    Rotavirus A NOT DETECTED NOT DETECTED Final   Sapovirus (I, II, IV, and V) NOT DETECTED NOT DETECTED Final    Comment: Performed at [facility], [address] [place], [place] [address]  C Difficile Quick Screen w PCR reflex     Status: None   Collection Time: 05/23/23 12:09 AM   Specimen: Stool  Result Value Ref Range Status   C Diff antigen NEGATIVE NEGATIVE Final   C Diff toxin NEGATIVE NEGATIVE Final   C Diff interpretation No C. difficile detected.  Final    Comment: Performed at [facility], [address] [place], [place] [address]    Radiology Studies: CT ABDOMEN PELVIS W CONTRAST  Result Date: 05/22/2023 CLINICAL DATA:  Increasing right lower quadrant abdominal pain for the past 3 days. EXAM: CT ABDOMEN AND PELVIS WITH CONTRAST TECHNIQUE: Multidetector CT imaging of the abdomen and pelvis was performed using the standard protocol following bolus administration of intravenous contrast. RADIATION DOSE REDUCTION: This exam was performed according to the departmental dose-optimization program which includes automated exposure control, adjustment of the mA and/or kV according to patient size and/or use of iterative reconstruction technique. CONTRAST:  OMNIPAQUE IOHEXOL 300 MG/ML  SOLN COMPARISON:  Nuclear medicine PET-CT  dated 01/29/2023. Abdomen and pelvis CT dated 12/07/2022. FINDINGS: Lower chest: Mildly enlarged heart. Minimal linear scarring at the right lung base without significant change. Minimal dependent atelectasis at the posterior right lung base. Hepatobiliary: Stable low-density mass with coarse calcifications at the lateral aspect of the lateral segment of the left lobe of the liver. This previously felt to represent an hemangioma. Additional hemangioma and cysts are unchanged. Cholecystectomy clips. Mildly progressive extrahepatic and intrahepatic biliary ductal dilatation with common duct diameter of 14.5 mm,  previously 11.7 mm. Pancreas: Stable pancreatic ductal dilatation, measuring 4.6 mm in diameter, previously 4.5 mm. Spleen: Normal in size without focal abnormality. Adrenals/Urinary Tract: Normal-appearing adrenal glands. Small left renal simple appearing cyst without significant change. This does not need imaging follow-up. The right kidney is not visualized. The urinary bladder is obscured by streak artifacts from a right hip prosthesis. There is a suggestion of mild-to-moderate diffuse bladder wall thickening with mucosal enhancement. No visible left ureteral abnormality. Stomach/Bowel: Unremarkable stomach, small bowel and colon. Surgically absent appendix. 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 No enlarged lymph nodes. Reproductive: Status post hysterectomy. No adnexal masses. Other: No abdominal wall hernia or abnormality. No abdominopelvic ascites. 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 Interbody and pedicle screw and rod fusion at the L3-4 level with normal alignment. Mild lumbar and lower thoracic spine degenerative changes. IMPRESSION: 1. Mildly progressive biliary ductal dilatation. Correlation with liver function tests is recommended. 2. Stable pancreatic ductal dilatation. 3.  Poorly visualized urinary bladder due to streak artifacts with a suggestion of mild-to-moderate diffuse bladder wall thickening with mucosal enhancement, suspicious for cystitis. 4. 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 5. Mild cardiomegaly. Electronically Signed   By: Beckie Salts M.D.   On: 05/22/2023 19:56    Scheduled Meds:  apixaban  5 mg Oral BID   atorvastatin  10 mg Oral Daily   bisoprolol  2.5 mg Oral Daily   buPROPion  75 mg Oral q morning   FLUoxetine  30 mg Oral QHS   gabapentin  600 mg Oral QHS   levothyroxine  88 mcg Oral Q0600   oxybutynin  5 mg Oral Daily   pantoprazole  40 mg Oral Daily   potassium chloride  40 mEq Oral BID   sodium chloride flush  10-40 mL Intracatheter Q12H   sodium chloride flush  3 mL Intravenous Q12H   Continuous Infusions:  sodium chloride     cefTRIAXone (ROCEPHIN)  IV     dextrose 5% lactated ringers with KCl 20 mEq/L 75 mL/hr at 05/23/23 1154    LOS: 1 day   Marguerita Merles, DO [facility] Available via Epic secure chat 7am-7pm After these hours, please refer to coverage provider listed on [email or web address] 05/23/2023, 8:07 PM

## 2023-05-23 NOTE — Evaluation (Signed)
Physical Therapy Evaluation Patient Details Name: [name] MRN: [id number] DOB: [birth year] Today's Date: 05/23/2023  History of Present Illness  77 y.o. 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 Dx of cystitis, abdominal pain with Nausea/vomiting/diarrhea likely due to viral gastroenteritis.  Clinical Impression  Pt admitted with above diagnosis. At baseline pt is able to ambulate household distances with a rollator, she denies falls in the past 6 months. Pt was able to take several pivotal steps from bed to recliner with a RW and min A for balance, distance limited by fatigue and abdominal pain. IV in L hand noted to be leaking blood, RN notified.  Pt currently with functional limitations due to the deficits listed below (see PT Problem List). Pt will benefit from acute skilled PT to increase their independence and safety with mobility to allow discharge.           If plan is discharge home, recommend the following: A little help with walking and/or transfers;A little help with bathing/dressing/bathroom;Assistance with cooking/housework;Assist for transportation;Help with stairs or ramp for entrance   Can travel by private vehicle        Equipment Recommendations None recommended by PT  Recommendations for Other Services       Functional Status Assessment Patient has had a recent decline in their functional status and demonstrates the ability to make significant improvements in function in a reasonable and predictable amount of time.     Precautions / Restrictions Precautions Precautions: Fall Precaution Comments: denies falls in past 6  months Restrictions Weight Bearing Restrictions: No      Mobility  Bed Mobility Overal bed mobility: Modified Independent             General bed mobility comments: HOB up, used rail, increased time/effort    Transfers Overall transfer level: Needs assistance Equipment used: Rolling walker (2 wheels) Transfers: Sit to/from Stand, Bed to chair/wheelchair/BSC Sit to Stand: Min assist   Step pivot transfers: Min assist       General transfer comment: assist to power up, VCs hand placement, min A to steady and manage RW, VCs for proximity to RW; pt took several pivotal steps bed to recliner    Ambulation/Gait Ambulation/Gait assistance: Min assist Gait Distance (Feet): 3 Feet Assistive device: Rolling walker (2 wheels) Gait Pattern/deviations: Step-to pattern, Trunk flexed Gait velocity: decr     General Gait Details: several pivotal steps bed to recliner with RW, min A to manage RW  Stairs            Wheelchair Mobility     Tilt Bed    Modified Rankin (Stroke Patients Only)       Balance Overall balance assessment: Needs assistance Sitting-balance support: Feet supported Sitting balance-Leahy Scale: Good     Standing balance support: During functional activity, Bilateral upper extremity supported Standing balance-Leahy Scale: Poor                               Pertinent Vitals/Pain Pain Assessment Pain Assessment: Faces Faces Pain Scale: Hurts even more Pain Location: abdomen Pain Descriptors / Indicators: Aching Pain Intervention(s): Limited activity within patient's tolerance, Monitored during session,  Repositioned    Home Living Family/patient expects to be discharged to:: Private residence Living Arrangements: Spouse/significant other Available Help at Discharge: Family;Available PRN/intermittently Type of Home: Other(Comment) (townhome) Home Access: Level entry       Home Layout: One level Home Equipment: Rollator (4  wheels);Shower seat;BSC/3in1;Wheelchair - manual;Grab bars - tub/shower      Prior Function Prior Level of Function : Independent/Modified Independent             Mobility Comments: walks with rollator; denies falls in past 6 months ADLs Comments: bathing/dressing MOD I; husband does driving     Extremity/Trunk Assessment   Upper Extremity Assessment Upper Extremity Assessment: Overall WFL for tasks assessed    Lower Extremity Assessment Lower Extremity Assessment: Overall WFL for tasks assessed    Cervical / Trunk Assessment Cervical / Trunk Assessment: Kyphotic  Communication   Communication Communication: No apparent difficulties  Cognition Arousal: Alert Behavior During Therapy: WFL for tasks assessed/performed Overall Cognitive Status: Within Functional Limits for tasks assessed                                          General Comments      Exercises     Assessment/Plan    PT Assessment Patient needs continued PT services  PT Problem List Decreased activity tolerance;Decreased balance;Decreased mobility;Pain       PT Treatment Interventions Gait training;Functional mobility training;Therapeutic activities;Therapeutic exercise;Patient/family education    PT Goals (Current goals can be found in the Care Plan section)  Acute Rehab PT Goals Patient Stated Goal: to be able to walk around at home PT Goal Formulation: With patient/family Time For Goal Achievement: 06/06/23 Potential to Achieve Goals: Good    Frequency Min 1X/week     Co-evaluation               AM-PAC PT "6 Clicks" Mobility  Outcome Measure Help needed turning from your back to your side while in a flat bed without using bedrails?: A Little Help needed moving from lying on your back to sitting on the side of a flat bed without using bedrails?: A Little Help needed moving to and from a bed to a chair (including a wheelchair)?: A Little Help needed standing up  from a chair using your arms (e.g., wheelchair or bedside chair)?: A Little Help needed to walk in hospital room?: A Lot Help needed climbing 3-5 steps with a railing? : Total 6 Click Score: 15    End of Session Equipment Utilized During Treatment: Gait belt Activity Tolerance: Patient limited by fatigue Patient left: in chair;with call bell/phone within reach;with family/visitor present Nurse Communication: Mobility status PT Visit Diagnosis: Difficulty in walking, not elsewhere classified (R26.2);Pain    Time: 6295-2841 PT Time Calculation (min) (ACUTE ONLY): 23 min   Charges:   PT Evaluation $PT Eval Moderate Complexity: 1 Mod PT Treatments $Therapeutic Activity: 8-22 mins PT General Charges $$ ACUTE PT VISIT: 1 Visit         Tamala Ser PT 05/23/2023  Acute Rehabilitation Services  Office [phone]

## 2023-05-23 NOTE — TOC Initial Note (Signed)
Transition of Care [facility]) - Initial/Assessment Note    Patient Details  Name: [name] MRN: [id number] Date of Birth: December 03, 1945  Transition of Care [facility]) CM/SW Contact:    Lanier Clam, RN Phone Number: 05/23/2023, 10:34 AM  Clinical Narrative: Spoke to Annetta(dtr) about d/c plans-concerns about transportation, & senior resources-added to AVS. Continue to monitor for d/c plans. Has own transport for home.                  Expected Discharge Plan: Home w Home Health Services Barriers to Discharge: Continued Medical Work up   Patient Goals and CMS Choice Patient states their goals for this hospitalization and ongoing recovery are:: Home CMS Medicare.gov Compare Post Acute Care list provided to:: Patient Represenative (must comment) Choice offered to / list presented to : Adult Children [place] ownership interest in [facility].provided to:: Adult Children    Expected Discharge Plan and Services   Discharge Planning Services: CM Consult Post Acute Care Choice: Home Health Living arrangements for the past 2 months: Single Family Home                                      Prior Living Arrangements/Services Living arrangements for the past 2 months: Single Family Home Lives with:: Spouse Patient language and need for interpreter reviewed:: Yes Do you feel safe going back to the place where you live?: Yes      Need for Family Participation in Patient Care: Yes (Comment) Care giver support system in place?: Yes (comment) Current home services: DME (rw) Criminal Activity/Legal Involvement Pertinent to Current Situation/Hospitalization: No - Comment as needed  Activities of Daily Living   ADL Screening (condition at time of admission) Independently performs ADLs?: Yes (appropriate for developmental age) Is the patient deaf or have difficulty hearing?: No Does the patient have difficulty seeing, even when wearing glasses/contacts?: Yes Does the  patient have difficulty concentrating, remembering, or making decisions?: No  Permission Sought/Granted Permission sought to share information with : Case Manager Permission granted to share information with : Yes, Verbal Permission Granted  Share Information with NAME: Case Manager           Emotional Assessment Appearance:: Appears stated age Attitude/Demeanor/Rapport: Gracious Affect (typically observed): Accepting Orientation: : Oriented to Self, Oriented to Place, Oriented to  Time, Oriented to Situation Alcohol / Substance Use: Not Applicable Psych Involvement: No (comment)  Admission diagnosis:  Acute cystitis [N30.00] Generalized abdominal pain [R10.84] Cystitis [N30.90] Patient Active Problem List   Diagnosis Date Noted   Acute cystitis 05/22/2023   Abdominal pain 05/22/2023   History of CVA (cerebrovascular accident) 05/22/2023   GAD (generalized anxiety disorder) 05/22/2023   Lung cancer (HCC) 03/01/2023   Lung nodule 02/15/2023   Clostridioides difficile infection 12/11/2022   Malnutrition of moderate degree (HCC) 12/08/2022   Intractable nausea and vomiting 12/07/2022   Hyponatremia 12/07/2022   Acute urinary retention 06/18/2022   Acute metabolic encephalopathy 06/17/2022   Pyuria 06/17/2022   GERD (gastroesophageal reflux disease) 06/17/2022   Hyperglycemia 06/17/2022   Low back pain 05/28/2020   Dilation of pancreatic duct 05/11/2020   Anterior dislocation of right shoulder 06/17/2019   Shoulder pain, right 06/17/2019   Duodenitis 02/24/2019   Chronic pain syndrome 02/24/2019   Debility 02/13/2019   Nausea and vomiting    Aspiration pneumonia (HCC)    Diarrhea    Pressure injury of  skin 02/08/2019   Pneumonia of right lower lobe due to methicillin susceptible Staphylococcus aureus (MSSA) (HCC)    Volume overload state of heart    Acute encephalopathy 02/01/2019   Acute respiratory failure with hypoxia (HCC) 02/01/2019   Hypokalemia 02/01/2019    Hypomagnesemia 02/01/2019   Leukocytosis 02/01/2019   Osteoarthritis 02/01/2019   Hypothyroidism 02/01/2019   Paroxysmal A-fib (HCC)    Anticoagulant long-term use    Persistent atrial fibrillation (HCC) 06/27/2017   Hip fracture (HCC) 06/27/2017   Irregular heart rate    Closed right hip fracture, initial encounter (HCC) 06/26/2017   Protein-calorie malnutrition, severe 06/30/2016   S/P laparoscopic cholecystectomy 06/28/2016   Visual field loss following stroke    Hyperlipidemia    Essential hypertension    PCP:  Chilton Greathouse, MD Pharmacy:   [facility] [id number] - , [place] - [address] [place] [address] Phone: [phone] Fax: [phone]  [facility] [address] [place] [address] Phone: [phone] Fax: [phone]     Social Determinants of Health (SDOH) Social History: SDOH Screenings   Food Insecurity: No Food Insecurity (05/22/2023)  Housing: Low Risk  (05/22/2023)  Transportation Needs: No Transportation Needs (05/22/2023)  Utilities: Not At Risk (05/22/2023)  Depression (PHQ2-9): Low Risk  (02/28/2023)  Tobacco Use: Medium Risk (05/22/2023)   SDOH Interventions:     Readmission Risk Interventions    05/23/2023   10:20 AM  Readmission Risk Prevention Plan  Transportation Screening Complete  PCP or Specialist Appt within 5-7 Days Complete  Home Care Screening Complete  Medication Review (RN CM) Complete

## 2023-05-23 NOTE — Hospital Course (Addendum)
HPI Per Dr. Tereasa Coop on 05/22/23:  [name] is a 77 y.o. 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  Patient reported that she has generalized abdominal pain and mostly on the right side and she has bladder spasm as well.  Patient reported having nausea, vomiting and diarrhea for last 2 days.  She has 7-10 episodes of small-volume stool.  Abdominal pain is a spasmodic in quality, and there is no associated relieving or aggravating factor.  Patient also has poor oral tolerance and vomiting for last 2 days as well.  No family member at home is sick at this time.  Denies any eating outside seafood or old food from the fridge.  Denies any recent use of antibiotic. Patient is complaining about bladder spasm with associated dysuria.  She denies any fever and chill however she has generalized weakness and poor oral tolerance. Denies any other complaint at this time.  **Interim History GI Pathogen Panel Positive for EPEC and Norovirus. Continued IV Ceftriaxone today and since Urine Cx showed NG will discontinue after tomorrow. Discussed with GI for further evaluation and they will work up biliary ductal dilatation with outpatient EUS.  PT OT recommending home health.  Patient was improving but felt worse today so will resume IVF given her multiple bowel movements overnight.  Anticipate discharging  the next 24 to 48 hours if she improves  Assessment and Plan:  Acute Cystitis -Patient presenting with complaining of bladder spasm, dysuria and increased urinary urgency for last 3 to 4 days.  Patient has severe bladder spasm and pain.  Patient also has diarrhea, nausea and vomiting and poor oral tolerance. -In  the ER presentation patient is hemodynamically stable. -CBC no evidence of leukocytosis. -UA showed evidence of UTI but urine culture showed no growth but still having Symptoms -Per chart review patient has UTI in April 2024 while she was treating with ceftriaxone also found to have concomitant infection with C. difficile and ceftriaxone has been transitioned to oral vancomycin. -Currently treating patient empirically with IV ceftriaxone and will complete at least 5 Days. Checking C. difficile panel and was negative.  -Continue to monitor fever curve WBC. -Will follow-up with urine culture for appropriate antibiotic guidance and pending. -Starting oxybutynin 5 mg daily. -Continue to monitor urine output.   Abdominal Pain, Nausea and Vomiting, Diarrhea in the setting of EPEC and Norovirus -Patient reported generalized abdominal pain with most pain to her right upper quadrant and suprapubic pain.  She is also complaining about nausea, vomiting and diarrhea for last 2 days.  She is afebrile.  No leukocytosis.  Per chart review patient had C. difficile associated diarrhea in April 2024.  Possibly patient might have another episode now however I have low suspicion now given the nature of acute onset of symptoms likely viral gastroenteritis origin. -CBC no evidence of leukocytosis and did show some Mild Renal Insufficiency  -CT abdomen pelvis: Showed mildly progressive biliary ductal dilation and a stable pancreatic duct dilation. -Checking GI panel and C. difficile panel; C Diff Negative but GI Pathogen Panel POSITIVE FOR Enteropathogenic E Coli and Norovirus -Continue IV Ceftriaxone for now  -Maintenance fluid D5 LR with KCl 10 mEq 75 mL/hr now stopped but will resume with normal saline at  75 mL/h -Chronic abdominal pain with secondary to underlying chronic biliary ductal dilation and chronic pancreatic duct dilation. -Continue Dilaudid as needed and will add low-dose tramadol for pain  control -Continue Zofran as needed -GI evaluated patient CT scan recommending outpatient follow-up with a EUS -The patient improved yesterday however today she worsened and started having similar symptoms again.  Will resume IV fluid hydration    History of chronic pancreatic dilation Pancreatic divisum History of cholecystectomy -CT abdomen pelvis showed progressive biliary ductal dilation and stable pancreatic dilation.  Per chart review of GI note expected postsurgical dilation of the common bile duct. -Normal hepatic function panel and normal lipase level. -Continue to monitor liver function.    History of Schatzki Ring History of esophageal dysmotility History of chronic abdominal pain Dilation of the common bile duct Dilation of the pancreatic duct -While patient was admitted in April she has been evaluated by [facility] Dr. Erik Obey.  Per chart review of the note EGD from 2021 showed tortuous esophagus, small hiatal hernia with partial Schatzki ring and chronic gastritis without evidence of H. pylori and dyspepsia.  Barium swallow study March 2023 showed significant esophageal dysmotility related to aging without evidence of stricture.   -Patient has been previously seen at [facility] for nausea vomiting and she is on Zofran at home as needed. -Continue Dysphagia Level 3 Diet. -C/w Supportive Care and Treat Infections as above -Discussed with GI who will evaluate the patient in the outpatient setting with an EUS   Hypokalemia -Patient's K+ Level Trend: Recent Labs  Lab 05/22/23 1639 05/23/23 0505 05/24/23 0541 05/25/23 0311  K 3.3* 3.4* 4.0 3.5  -IVF with D5 in LR + 20 mEQ at 75 mL/hr has now stopped but will restart IVF with NS at 75 mL/hr -Replete with po KCL 40 mEQ BID -Continue to Monitor and Replete as Necessary -Repeat CMP in the AM   Hypomagnesemia, improved  -Patient's Mag Level Trend: Recent Labs  Lab 05/22/23 1639 05/24/23 0541 05/25/23 0311  MG 1.9 1.6*  2.0  -Continue to Monitor and Replete as Necessary -Repeat Mag in the AM   Hypophosphatemia -Phos Level Trend: Recent Labs  Lab 05/24/23 0541 05/25/23 0311  PHOS 2.3* 4.1  -Replete with po K Phos 500 mg po BID x2 -Continue to Monitor and repeat Phos in the AM  Hyponatremia -Serum sodium 134.  Mild hyponatremia in the setting of poor oral intake. -Na+ Trend:  Recent Labs  Lab 05/22/23 1639 05/23/23 0505 05/24/23 0541 05/25/23 0311  NA 134* 137 136 137  -Continue to monitor sodium level closely -Repeat CBC in the AM   Paroxysmal atrial fibrillation -EKG showing atrial fibrillation heart rate 125. -Resumed home bisoprolol 25 mg daily and continue Eliquis 5 mg twice daily.   Essential hypertension but was a little Hypotensive -Continue Bisoprolol 2.5 mg daily.   -Holding Lasix as patient is euvolemic on physical exam. -Given a 500 mL Bolus -Continue to Monitor BP per Protocol -Last BP reading was 97/66   Hypothyroidism -Continue Levothyroxine 88 mcg po Daily   Normocytic Anemia -Hgb/Hct Trend: Recent Labs  Lab 05/22/23 1639 05/23/23 0505 05/24/23 0541 05/25/23 0311  HGB 14.0 12.9 10.9* 11.5*  HCT 41.0 38.6 34.5* 35.4*  MCV 91.7 94.6 99.7 97.5  -Checked Anemia Panel showed an iron level of 34, UIBC of 306, TIBC of 340, saturation ratios of 10%, ferritin level 10, folate level 10.0, vitamin B12 218 -Continue to Monitor for S/Sx of Bleeding; No overt bleeding  noted -Repeat CBC in the AM   Chronic Pain Syndrome -Patient has history of chronic syndrome.  She used to be on chronic fentanyl patch which has been gradually weaned off in April 2024. -Currently she takes gabapentin 600 mg at bedtime which we will continue   History of CVA -Patient is not on Aspirin unclear etiology. -Continue Eliquis 5 mg twice daily (also for management of PACs Mantel fibrillation) -Continue Atorvastatin 10 mg daily.   Generalized Anxiety Disorder -Continue Wellbutrin 75 mg daily  and Fluoxetine 10 mg daily   GERD -Continue Omeprazole 20 mg daily substitution with Pantoprazole 40 mg po Daily  Hypoalbuminemia -Patient's Albumin Trend: Recent Labs  Lab 05/22/23 1639 05/23/23 0505 05/24/23 0541 05/25/23 0311  ALBUMIN 4.6 3.7 3.3* 3.4*  -Continue to Monitor and Trend and repeat CMP in the AM

## 2023-05-24 ENCOUNTER — Telehealth: Payer: Self-pay

## 2023-05-24 DIAGNOSIS — K8689 Other specified diseases of pancreas: Secondary | ICD-10-CM | POA: Diagnosis not present

## 2023-05-24 DIAGNOSIS — R109 Unspecified abdominal pain: Secondary | ICD-10-CM | POA: Diagnosis not present

## 2023-05-24 DIAGNOSIS — N3000 Acute cystitis without hematuria: Secondary | ICD-10-CM | POA: Diagnosis not present

## 2023-05-24 DIAGNOSIS — R197 Diarrhea, unspecified: Secondary | ICD-10-CM | POA: Diagnosis not present

## 2023-05-24 LAB — COMPREHENSIVE METABOLIC PANEL
ALT: 12 U/L (ref 0–44)
AST: 13 U/L — ABNORMAL LOW (ref 15–41)
Albumin: 3.3 g/dL — ABNORMAL LOW (ref 3.5–5.0)
Alkaline Phosphatase: 60 U/L (ref 38–126)
Anion gap: 8 (ref 5–15)
BUN: 14 mg/dL (ref 8–23)
CO2: 25 mmol/L (ref 22–32)
Calcium: 9.1 mg/dL (ref 8.9–10.3)
Chloride: 103 mmol/L (ref 98–111)
Creatinine, Ser: 1.14 mg/dL — ABNORMAL HIGH (ref 0.44–1.00)
GFR, Estimated: 49 mL/min — ABNORMAL LOW (ref >60)
Glucose, Bld: 102 mg/dL — ABNORMAL HIGH (ref 70–99)
Potassium: 4 mmol/L (ref 3.5–5.1)
Sodium: 136 mmol/L (ref 135–145)
Total Bilirubin: 0.8 mg/dL (ref 0.3–1.2)
Total Protein: 5.7 g/dL — ABNORMAL LOW (ref 6.5–8.1)

## 2023-05-24 LAB — CBC WITH DIFFERENTIAL/PLATELET
Abs Immature Granulocytes: 0.03 10*3/uL (ref 0.00–0.07)
Basophils Absolute: 0 10*3/uL (ref 0.0–0.1)
Basophils Relative: 0 %
Eosinophils Absolute: 0.1 10*3/uL (ref 0.0–0.5)
Eosinophils Relative: 2 %
HCT: 34.5 % — ABNORMAL LOW (ref 36.0–46.0)
Hemoglobin: 10.9 g/dL — ABNORMAL LOW (ref 12.0–15.0)
Immature Granulocytes: 1 %
Lymphocytes Relative: 24 %
Lymphs Abs: 1.4 10*3/uL (ref 0.7–4.0)
MCH: 31.5 pg (ref 26.0–34.0)
MCHC: 31.6 g/dL (ref 30.0–36.0)
MCV: 99.7 fL (ref 80.0–100.0)
Monocytes Absolute: 0.7 10*3/uL (ref 0.1–1.0)
Monocytes Relative: 12 %
Neutro Abs: 3.5 10*3/uL (ref 1.7–7.7)
Neutrophils Relative %: 61 %
Platelets: 215 10*3/uL (ref 150–400)
RBC: 3.46 MIL/uL — ABNORMAL LOW (ref 3.87–5.11)
RDW: 12.1 % (ref 11.5–15.5)
WBC: 5.8 10*3/uL (ref 4.0–10.5)
nRBC: 0 % (ref 0.0–0.2)

## 2023-05-24 LAB — MAGNESIUM: Magnesium: 1.6 mg/dL — ABNORMAL LOW (ref 1.7–2.4)

## 2023-05-24 LAB — URINE CULTURE: Culture: NO GROWTH

## 2023-05-24 LAB — PHOSPHORUS: Phosphorus: 2.3 mg/dL — ABNORMAL LOW (ref 2.5–4.6)

## 2023-05-24 MED ORDER — SODIUM CHLORIDE 0.9 % IV BOLUS
500.0000 mL | Freq: Once | INTRAVENOUS | Status: AC
  Administered 2023-05-24: 500 mL via INTRAVENOUS

## 2023-05-24 MED ORDER — K PHOS MONO-SOD PHOS DI & MONO 155-852-130 MG PO TABS
500.0000 mg | ORAL_TABLET | Freq: Two times a day (BID) | ORAL | Status: AC
  Administered 2023-05-24 (×2): 500 mg via ORAL
  Filled 2023-05-24 (×3): qty 2

## 2023-05-24 MED ORDER — MAGNESIUM SULFATE 2 GM/50ML IV SOLN
2.0000 g | Freq: Once | INTRAVENOUS | Status: AC
  Administered 2023-05-24: 2 g via INTRAVENOUS
  Filled 2023-05-24: qty 50

## 2023-05-24 NOTE — Telephone Encounter (Signed)
Pt scheduled to see Doug Sou PA 07/03/23 at 1:30pm. Will call her next week with appt.

## 2023-05-24 NOTE — Plan of Care (Signed)
Pt is alert and oriented x 4. Husband at bedside. Up with 1 assist to BR/BSC. Frequent urination. Amber urine with hematuria at times. Per pt and husband has had hematuria since April. No bm this shift. But pain in abdomen when up to bathroom. Tramadol given x 1 and effective at reducing pain. This am approx 0230 pt complaint of severe pain dilaudid 1mg  given. Midline to Left upper arm [id number] K infusing. Pt managed to eat supper DYS 3 diet. But reported pain following. BS x 4 active. Vitals stable.  Problem: Education: Goal: Knowledge of General Education information will improve Description: Including pain rating scale, medication(s)/side effects and non-pharmacologic comfort measures Outcome: Progressing   Problem: Health Behavior/Discharge Planning: Goal: Ability to manage health-related needs will improve Outcome: Progressing   Problem: Clinical Measurements: Goal: Ability to maintain clinical measurements within normal limits will improve Outcome: Progressing Goal: Will remain free from infection Outcome: Progressing Goal: Diagnostic test results will improve Outcome: Progressing Goal: Respiratory complications will improve Outcome: Progressing Goal: Cardiovascular complication will be avoided Outcome: Progressing   Problem: Activity: Goal: Risk for activity intolerance will decrease Outcome: Progressing   Problem: Nutrition: Goal: Adequate nutrition will be maintained Outcome: Progressing   Problem: Coping: Goal: Level of anxiety will decrease Outcome: Progressing   Problem: Elimination: Goal: Will not experience complications related to bowel motility Outcome: Progressing Goal: Will not experience complications related to urinary retention Outcome: Progressing   Problem: Pain Managment: Goal: General experience of comfort will improve Outcome: Progressing   Problem: Safety: Goal: Ability to remain free from injury will improve Outcome: Progressing   Problem:  Skin Integrity: Goal: Risk for impaired skin integrity will decrease Outcome: Progressing   Problem: Education: Goal: Knowledge of disease or condition will improve Outcome: Progressing Goal: Knowledge of the prescribed therapeutic regimen will improve Outcome: Progressing Goal: Individualized Educational Video(s) Outcome: Progressing   Problem: Activity: Goal: Ability to tolerate increased activity will improve Outcome: Progressing Goal: Will verbalize the importance of balancing activity with adequate rest periods Outcome: Progressing   Problem: Respiratory: Goal: Ability to maintain a clear airway will improve Outcome: Progressing Goal: Levels of oxygenation will improve Outcome: Progressing Goal: Ability to maintain adequate ventilation will improve Outcome: Progressing

## 2023-05-24 NOTE — Progress Notes (Signed)
PROGRESS NOTE    [name]  EAV:[id number] DOB: Jan 04, 1946 DOA: 05/22/2023 PCP: Chilton Greathouse, MD   Brief Narrative:  HPI Per Dr. Tereasa Coop on 05/22/23:  [name] is a 77 y.o. 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  Patient reported that she has generalized abdominal pain and mostly on the right side and she has bladder spasm as well.  Patient reported having nausea, vomiting and diarrhea for last 2 days.  She has 7-10 episodes of small-volume stool.  Abdominal pain is a spasmodic in quality, and there is no associated relieving or aggravating factor.  Patient also has poor oral tolerance and vomiting for last 2 days as well.  No family member at home is sick at this time.  Denies any eating outside seafood or old food from the fridge.  Denies any recent use of antibiotic. Patient is complaining about bladder spasm with associated dysuria.  She denies any fever and chill however she has generalized weakness and poor oral tolerance. Denies any other complaint at this time.  **Interim History GI Pathogen Panel Positive for EPEC and Norovirus. Continued IV Ceftriaxone today and since Urine Cx showed NG will discontinue after tomorrow. Discussed with GI for further evaluation and they will work up biliary ductal dilatation with outpatient EUS.  PT OT recommending home health.  Patient is improving further and anticipate starting the next 24 to 48 hours  Assessment and Plan:  Acute Cystitis -Patient presenting with complaining of bladder spasm, dysuria and increased urinary urgency for last 3 to 4 days.  Patient has severe bladder spasm and pain.  Patient also has diarrhea, nausea and  vomiting and poor oral tolerance. -In the ER presentation patient is hemodynamically stable. -CBC no evidence of leukocytosis. -UA showed evidence of UTI but urine culture showed no growth -In the ED patient has been given IV ceftriaxone, Dilaudid 1.5 mg and 1 L of NS bolus.   -Per chart review patient has UTI in April 2024 while she was treating with ceftriaxone also found to have concomitant infection with C. difficile and ceftriaxone has been transitioned to oral vancomycin. -Currently treating patient empirically with IV ceftriaxone and will complete at least 3 days.  Checking C. difficile panel and was negative.  -Continue to monitor fever curve WBC. -Will follow-up with urine culture for appropriate antibiotic guidance and pending. -Starting oxybutynin 5 mg daily. -Continue to monitor urine output.   Abdominal Pain, Nausea and Vomiting, Diarrhea in the setting of EPEC and Norovirus -Patient reported generalized abdominal pain with most pain to her right upper quadrant and suprapubic pain.  She is also complaining about nausea, vomiting and diarrhea for last 2 days.  She is afebrile.  No leukocytosis.  Per chart review patient had C. difficile associated diarrhea in April 2024.  Possibly patient might have another episode now however I have low suspicion now given the nature of acute onset of symptoms likely viral gastroenteritis origin. -CBC no evidence of leukocytosis and CMP did not showed any evidence of AKI -CT abdomen pelvis: Showed mildly progressive biliary ductal dilation and a stable pancreatic duct dilation. -Checking GI panel and C. difficile panel; C Diff Negative but GI Pathogen Panel POSITIVE FOR Enteropathogenic E Coli and Norovirus -Continue  IV Ceftriaxone -Maintenance fluid D5 LR with KCl 10 mEq 75 mL/hr now stopped.  -Chronic abdominal pain with secondary to underlying chronic biliary ductal dilation and chronic pancreatic duct dilation. -Continue Dilaudid as needed and  will add low-dose tramadol for pain control -Continue Zofran as needed -GI evaluated patient CT scan recommending outpatient follow-up with a EUS -Patient improving and will need further supportive care anticipate discharging in next 24 to 48 hours    History of chronic pancreatic dilation Pancreatic divisum History of cholecystectomy -CT abdomen pelvis showed progressive biliary ductal dilation and stable pancreatic dilation.  Per chart review of GI note expected postsurgical dilation of the common bile duct. -Normal hepatic function panel and normal lipase level. -Continue to monitor liver function.    History of Schatzki Ring History of esophageal dysmotility History of chronic abdominal pain Dilation of the common bile duct Dilation of the pancreatic duct -While patient was admitted in April she has been evaluated by [facility] Dr. Erik Obey.  Per chart review of the note EGD from 2021 showed tortuous esophagus, small hiatal hernia with partial Schatzki ring and chronic gastritis without evidence of H. pylori and dyspepsia.  Barium swallow study March 2023 showed significant esophageal dysmotility related to aging without evidence of stricture.   -Patient has been previously seen at [facility] for nausea vomiting and she is on Zofran at home as needed. -Continue Dysphagia Level 3 Diet. -C/w Supportive Care and Treat Infections as above -Discussed with GI who will evaluate the patient in the outpatient setting with an EUS   Hypokalemia -Patient's K+ Level Trend: Recent Labs  Lab 05/22/23 1639 05/23/23 0505 05/24/23 0541  K 3.3* 3.4* 4.0  -IVF with D5 in LR + 20 mEQ at 75 mL/hr has now stopped -Replete with po KCL 40 mEQ Pakcets BID yesterday -Continue to Monitor and Replete as Necessary -Repeat CMP in the AM   Hypomagnesemia -Patient's Mag Level Trend: Recent Labs  Lab 05/22/23 1639 05/24/23 0541  MG 1.9 1.6*  -Replete with IV Mag Sulfate 2 grams -Continue to Monitor  and Replete as Necessary -Repeat Mag in the AM   Hypophosphatemia -Phos Level Trend: Recent Labs  Lab 05/24/23 0541  PHOS 2.3*  -Replete with po K Phos 500 mg po BID x2 -Continue to Monitor and repeat Phos in the AM  Hyponatremia -Serum sodium 134.  Mild hyponatremia in the setting of poor oral intake. -Na+ Trend:  Recent Labs  Lab 05/22/23 1639 05/23/23 0505 05/24/23 0541  NA 134* 137 136  -Continue to monitor sodium level closely -Repeat CBC in the AM   Paroxysmal atrial fibrillation -EKG showing atrial fibrillation heart rate 125. -Resumed home bisoprolol 25 mg daily and continue Eliquis 5 mg twice daily.   Essential hypertension but was a little Hypotensive -Continue Bisoprolol 2.5 mg daily.   -Holding Lasix as patient is euvolemic on physical exam. -Given a 500 mL Bolus -Continue to Monitor BP per Protocol -Last BP reading was 90/55   Hypothyroidism -Continue Levothyroxine 88 mcg po Daily   Normocytic Anemia -Hgb/Hct Trend: Recent Labs  Lab 05/22/23 1639 05/23/23 0505 05/24/23 0541  HGB 14.0 12.9 10.9*  HCT 41.0 38.6 34.5*  MCV 91.7 94.6 99.7  -Check Anemia Panel in the AM -Continue to Monitor for S/Sx of Bleeding; No overt bleeding noted -Repeat CBC in the AM   Chronic pain syndrome -Patient has history of chronic syndrome.  She used to be on chronic fentanyl patch which has been gradually weaned off  in April 2024. -Currently she takes gabapentin 600 mg at bedtime which we will continue   History of CVA -Patient is not on Aspirin unclear etiology. -Continue Eliquis 5 mg twice daily (also for management of PACs Mantel fibrillation) -Continue Atorvastatin 10 mg daily.   Generalized Anxiety Disorder -Continue Wellbutrin 75 mg daily and Fluoxetine 10 mg daily   GERD -Continue Omeprazole 20 mg daily substitution with Pantoprazole 40 mg po Daily  Hypoalbuminemia -Patient's Albumin Trend: Recent Labs  Lab 05/22/23 1639 05/23/23 0505  05/24/23 0541  ALBUMIN 4.6 3.7 3.3*  -Continue to Monitor and Trend and repeat CMP in the AM DVT prophylaxis: SCDs Start: 05/22/23 2101 Place TED hose Start: 05/22/23 2101 apixaban (ELIQUIS) tablet 5 mg    Code Status: Full Code Family Communication: Discussed with Husband at bedside  Disposition Plan:  Level of care: Med-Surg Status is: Inpatient Remains inpatient appropriate because: Needs further clinical improvement and anticipating D/C in the next 24-48 hours   Consultants:  Discussed with GI  Procedures:  As delineated as above  Antimicrobials:  Anti-infectives (From admission, onward)    Start     Dose/Rate Route Frequency Ordered Stop   05/23/23 2000  cefTRIAXone (ROCEPHIN) 1 g in sodium chloride 0.9 % 100 mL IVPB  Status:  Discontinued        1 g 200 mL/hr over 30 Minutes Intravenous Every 24 hours 05/22/23 2100 05/22/23 2114   05/23/23 2000  cefTRIAXone (ROCEPHIN) 1 g in sodium chloride 0.9 % 100 mL IVPB        1 g 200 mL/hr over 30 Minutes Intravenous Every 24 hours 05/22/23 2131 05/29/23 1959   05/22/23 1945  cefTRIAXone (ROCEPHIN) 1 g in sodium chloride 0.9 % 100 mL IVPB        1 g 200 mL/hr over 30 Minutes Intravenous  Once 05/22/23 1932 05/22/23 2056       Subjective: Seen and examined at bedside and the patient was feeling much better today compared to yesterday.  No vomiting but had some nausea.  Diarrhea is improved and essentially.  Patient feels a little bit stronger today.  No other concerns or complaints this time and wanting to eat and see how she does.  Objective: Vitals:   05/23/23 1932 05/24/23 0413 05/24/23 0500 05/24/23 1256  BP: 119/68 111/73  (!) 90/55  Pulse: 88 75  71  Resp: 16 20  19   Temp: 98.4 F (36.9 C) 98.8 F (37.1 C)  98.6 F (37 C)  TempSrc: Oral Oral  Oral  SpO2: 97% 97%  100%  Weight:   67 kg     Intake/Output Summary (Last 24 hours) at 05/24/2023 1703 Last data filed at 05/24/2023 1629 Gross per 24 hour  Intake 810 ml   Output 850 ml  Net -40 ml   Filed Weights   05/22/23 2200 05/23/23 0444 05/24/23 0500  Weight: 64 kg 64 kg 67 kg   Examination: Physical Exam:  Constitutional: WN/WD elderly Caucasian female no acute distress sitting in the chair at bedside Respiratory: Diminished to auscultation bilaterally, no wheezing, rales, rhonchi or crackles. Normal respiratory effort and patient is not tachypenic. No accessory muscle use.  Unlabored breathing Cardiovascular: RRR, no murmurs / rubs / gallops. S1 and S2 auscultated. No extremity edema. 2+ pedal pulses. No carotid bruits.  Abdomen: Soft, non-tender, non-distended. Bowel sounds positive.  GU: Deferred. Musculoskeletal: No clubbing / cyanosis of digits/nails. No joint deformity upper and lower extremities. Skin: No rashes, lesions, ulcers on a  limited skin evaluation. No induration; Warm and dry.  Neurologic: CN 2-12 grossly intact with no focal deficits. Romberg sign and cerebellar reflexes not assessed.  Psychiatric: Normal judgment and insight. Alert and oriented x 3. Normal mood and appropriate affect.   Data Reviewed: I have personally reviewed following labs and imaging studies  CBC: Recent Labs  Lab 05/22/23 1639 05/23/23 0505 05/24/23 0541  WBC 7.4 9.6 5.8  NEUTROABS 6.0  --  3.5  HGB 14.0 12.9 10.9*  HCT 41.0 38.6 34.5*  MCV 91.7 94.6 99.7  PLT 292 278 215   Basic Metabolic Panel: Recent Labs  Lab 05/22/23 1639 05/23/23 0505 05/24/23 0541  NA 134* 137 136  K 3.3* 3.4* 4.0  CL 100 103 103  CO2 22 22 25   GLUCOSE 130* 118* 102*  BUN 17 18 14   CREATININE 0.96 1.13* 1.14*  CALCIUM 10.0 9.6 9.1  MG 1.9  --  1.6*  PHOS  --   --  2.3*   GFR: Estimated Creatinine Clearance: 36.6 mL/min (A) (by C-G formula based on SCr of 1.14 mg/dL (H)). Liver Function Tests: Recent Labs  Lab 05/22/23 1639 05/23/23 0505 05/24/23 0541  AST 16 16 13*  ALT 12 12 12   ALKPHOS 82 71 60  BILITOT 1.6* 0.8 0.8  PROT 7.5 6.7 5.7*  ALBUMIN  4.6 3.7 3.3*   Recent Labs  Lab 05/22/23 1639  LIPASE 27   No results for input(s): "AMMONIA" in the last 168 hours. Coagulation Profile: No results for input(s): "INR", "PROTIME" in the last 168 hours. Cardiac Enzymes: No results for input(s): "CKTOTAL", "CKMB", "CKMBINDEX", "TROPONINI" in the last 168 hours. BNP (last 3 results) No results for input(s): "PROBNP" in the last 8760 hours. HbA1C: No results for input(s): "HGBA1C" in the last 72 hours. CBG: No results for input(s): "GLUCAP" in the last 168 hours. Lipid Profile: No results for input(s): "CHOL", "HDL", "LDLCALC", "TRIG", "CHOLHDL", "LDLDIRECT" in the last 72 hours. Thyroid Function Tests: No results for input(s): "TSH", "T4TOTAL", "FREET4", "T3FREE", "THYROIDAB" in the last 72 hours. Anemia Panel: No results for input(s): "VITAMINB12", "FOLATE", "FERRITIN", "TIBC", "IRON", "RETICCTPCT" in the last 72 hours. Sepsis Labs: No results for input(s): "PROCALCITON", "LATICACIDVEN" in the last 168 hours.  Recent Results (from the past 240 hour(s))  Urine Culture     Status: None   Collection Time: 05/22/23  8:08 PM   Specimen: Urine, Clean Catch  Result Value Ref Range Status   Specimen Description   Final    URINE, CLEAN CATCH Performed at [facility], [address], [place], [place] [address]    Special Requests   Final    NONE Performed at [facility], [address], [place], [place] [address]    Culture   Final    NO GROWTH Performed at [facility], [address] [place], [place] [address]    Report Status 05/24/2023 FINAL  Final  Gastrointestinal Panel by PCR , Stool     Status: Abnormal   Collection Time: 05/23/23 12:09 AM   Specimen: Stool  Result Value Ref Range Status   Campylobacter species NOT DETECTED NOT DETECTED Final   Plesimonas shigelloides NOT DETECTED NOT DETECTED Final   Salmonella species NOT DETECTED NOT DETECTED Final   Yersinia  enterocolitica NOT DETECTED NOT DETECTED Final   Vibrio species NOT DETECTED NOT DETECTED Final   Vibrio cholerae NOT DETECTED NOT DETECTED Final   Enteroaggregative E coli (EAEC) NOT DETECTED NOT DETECTED  Final   Enteropathogenic E coli (EPEC) DETECTED (A) NOT DETECTED Final    Comment: RESULT CALLED TO, READ BACK BY AND VERIFIED WITH: REBECCA FEWELL 05/23/23 1423 MW    Enterotoxigenic E coli (ETEC) NOT DETECTED NOT DETECTED Final   Shiga like toxin producing E coli (STEC) NOT DETECTED NOT DETECTED Final   Shigella/Enteroinvasive E coli (EIEC) NOT DETECTED NOT DETECTED Final   Cryptosporidium NOT DETECTED NOT DETECTED Final   Cyclospora cayetanensis NOT DETECTED NOT DETECTED Final   Entamoeba histolytica NOT DETECTED NOT DETECTED Final   Giardia lamblia NOT DETECTED NOT DETECTED Final   Adenovirus F40/41 NOT DETECTED NOT DETECTED Final   Astrovirus NOT DETECTED NOT DETECTED Final   Norovirus GI/GII DETECTED (A) NOT DETECTED Final    Comment: RESULT CALLED TO, READ BACK BY AND VERIFIED WITH: REBECCA FEWELL 05/22/22 1423 MW    Rotavirus A NOT DETECTED NOT DETECTED Final   Sapovirus (I, II, IV, and V) NOT DETECTED NOT DETECTED Final    Comment: Performed at [facility], [address] [place], [place] [address]  C Difficile Quick Screen w PCR reflex     Status: None   Collection Time: 05/23/23 12:09 AM   Specimen: Stool  Result Value Ref Range Status   C Diff antigen NEGATIVE NEGATIVE Final   C Diff toxin NEGATIVE NEGATIVE Final   C Diff interpretation No C. difficile detected.  Final    Comment: Performed at [facility], [address] [place], [place] [address]    Radiology Studies: CT ABDOMEN PELVIS W CONTRAST  Result Date: 05/22/2023 CLINICAL DATA:  Increasing right lower quadrant abdominal pain for the past 3 days. EXAM: CT ABDOMEN AND PELVIS WITH CONTRAST TECHNIQUE: Multidetector CT imaging of the abdomen and pelvis was performed using the  standard protocol following bolus administration of intravenous contrast. RADIATION DOSE REDUCTION: This exam was performed according to the departmental dose-optimization program which includes automated exposure control, adjustment of the mA and/or kV according to patient size and/or use of iterative reconstruction technique. CONTRAST:  OMNIPAQUE IOHEXOL 300 MG/ML  SOLN COMPARISON:  Nuclear medicine PET-CT dated 01/29/2023. Abdomen and pelvis CT dated 12/07/2022. FINDINGS: Lower chest: Mildly enlarged heart. Minimal linear scarring at the right lung base without significant change. Minimal dependent atelectasis at the posterior right lung base. Hepatobiliary: Stable low-density mass with coarse calcifications at the lateral aspect of the lateral segment of the left lobe of the liver. This previously felt to represent an hemangioma. Additional hemangioma and cysts are unchanged. Cholecystectomy clips. Mildly progressive extrahepatic and intrahepatic biliary ductal dilatation with common duct diameter of 14.5 mm, previously 11.7 mm. Pancreas: Stable pancreatic ductal dilatation, measuring 4.6 mm in diameter, previously 4.5 mm. Spleen: Normal in size without focal abnormality. Adrenals/Urinary Tract: Normal-appearing adrenal glands. Small left renal simple appearing cyst without significant change. This does not need imaging follow-up. The right kidney is not visualized. The urinary bladder is obscured by streak artifacts from a right hip prosthesis. There is a suggestion of mild-to-moderate diffuse bladder wall thickening with mucosal enhancement. No visible left ureteral abnormality. Stomach/Bowel: Unremarkable stomach, small bowel and colon. Surgically absent appendix. 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 No enlarged lymph nodes. Reproductive: Status post hysterectomy. No adnexal masses. Other: No abdominal wall hernia  or abnormality. No abdominopelvic ascites. 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 Interbody and pedicle screw and rod fusion at the L3-4 level with normal alignment.  Mild lumbar and lower thoracic spine degenerative changes. IMPRESSION: 1. Mildly progressive biliary ductal dilatation. Correlation with liver function tests is recommended. 2. Stable pancreatic ductal dilatation. 3. Poorly visualized urinary bladder due to streak artifacts with a suggestion of mild-to-moderate diffuse bladder wall thickening with mucosal enhancement, suspicious for cystitis. 4. 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 5. Mild cardiomegaly. Electronically Signed   By: Beckie Salts M.D.   On: 05/22/2023 19:56    Scheduled Meds:  apixaban  5 mg Oral BID   atorvastatin  10 mg Oral Daily   bisoprolol  2.5 mg Oral Daily   buPROPion  75 mg Oral q morning   FLUoxetine  30 mg Oral QHS   gabapentin  600 mg Oral QHS   levothyroxine  88 mcg Oral Q0600   oxybutynin  5 mg Oral Daily   pantoprazole  40 mg Oral Daily   phosphorus  500 mg Oral BID   sodium chloride flush  10-40 mL Intracatheter Q12H   sodium chloride flush  3 mL Intravenous Q12H   Continuous Infusions:  sodium chloride     cefTRIAXone (ROCEPHIN)  IV 1 g (05/23/23 2029)    LOS: 2 days   Marguerita Merles, DO [facility] Available via Epic secure chat 7am-7pm After these hours, please refer to coverage provider listed on [email or web address] 05/24/2023, 5:03 PM

## 2023-05-24 NOTE — TOC Progression Note (Signed)
Transition of Care [facility]) - Progression Note    Patient Details  Name: [name] MRN: [id number] Date of Birth: Sep 30, 1945  Transition of [facility]) CM/SW Contact  Millie Forde, Olegario Messier, RN Phone Number: 05/24/2023, 2:35 PM  Clinical Narrative:  PT recc HHPT-left vm w/Anneta(dtr) for call back to discuss HHC services, & agency of choice-await return call..     Expected Discharge Plan: Home w Home Health Services Barriers to Discharge: Continued Medical Work up  Expected Discharge Plan and Services   Discharge Planning Services: CM Consult Post Acute Care Choice: Home Health Living arrangements for the past 2 months: Single Family Home                                       Social Determinants of Health (SDOH) Interventions SDOH Screenings   Food Insecurity: No Food Insecurity (05/22/2023)  Housing: Low Risk  (05/22/2023)  Transportation Needs: No Transportation Needs (05/22/2023)  Utilities: Not At Risk (05/22/2023)  Depression (PHQ2-9): Low Risk  (02/28/2023)  Tobacco Use: Medium Risk (05/22/2023)    Readmission Risk Interventions    05/23/2023   10:20 AM  Readmission Risk Prevention Plan  Transportation Screening Complete  PCP or Specialist Appt within 5-7 Days Complete  Home Care Screening Complete  Medication Review (RN CM) Complete

## 2023-05-24 NOTE — Plan of Care (Signed)
  Problem: Elimination: Goal: Will not experience complications related to urinary retention Outcome: Progressing   Problem: Safety: Goal: Ability to remain free from injury will improve Outcome: Progressing   Problem: Coping: Goal: Level of anxiety will decrease Outcome: Progressing   Problem: Activity: Goal: Will verbalize the importance of balancing activity with adequate rest periods Outcome: Progressing

## 2023-05-24 NOTE — TOC Progression Note (Signed)
Transition of Care [facility]) - Progression Note    Patient Details  Name: DOMITILA STETLER MRN: [id number] Date of Birth: 1946-03-16  Transition [facility] Care [facility]) CM/SW Contact  Jozelynn Danielson, Olegario Messier, RN Phone Number: 05/24/2023, 3:14 PM  Clinical Narrative:  Annetta(dtr) no preference for HHC agency-[facility] rep Tresa Endo HHPT accepted.     Expected Discharge Plan: Home w Home Health Services Barriers to Discharge: Continued Medical Work up  Expected Discharge Plan and Services   Discharge Planning Services: CM Consult Post Acute Care Choice: Home Health Living arrangements for the past 2 months: Single Family Home                           HH Arranged: PT HH Agency: [facility] Date HH Agency Contacted: 05/24/23 Time HH Agency Contacted: 1514 Representative spoke with at [facility] Agency: Tresa Endo   Social Determinants of Health (SDOH) Interventions SDOH Screenings   Food Insecurity: No Food Insecurity (05/22/2023)  Housing: Low Risk  (05/22/2023)  Transportation Needs: No Transportation Needs (05/22/2023)  Utilities: Not At Risk (05/22/2023)  Depression (PHQ2-9): Low Risk  (02/28/2023)  Tobacco Use: Medium Risk (05/22/2023)    Readmission Risk Interventions    05/23/2023   10:20 AM  Readmission Risk Prevention Plan  Transportation Screening Complete  PCP or Specialist Appt within 5-7 Days Complete  Home Care Screening Complete  Medication Review (RN CM) Complete

## 2023-05-24 NOTE — Telephone Encounter (Signed)
-----   Message from Carie Caddy Pyrtle sent at 05/23/2023  8:10 PM EDT ----- Regarding: RE: Office visit-patient of Dr. Rhea Belton This is Dr. Sharee Pimple mom-in-law JMP ----- Message ----- From: Sammuel Cooper, PA-C Sent: 05/23/2023   3:54 PM EDT To: Chrystie Nose, RN; Beverley Fiedler, MD Subject: Office visit-patient of Dr. Rhea Belton             [name],-this patient is the currently in the hospital with an acute gastroenteritis Pt  of Dr. Rhea Belton, will need an office follow-up to consider EUS at some point for gradual progression of a dilated common bile duct. Please get her an office visit-I know this will be several months out-yes okay with an app if she is seeing one of the apps in the past She will need to be called, perhaps early next week without appointment information -thank you very much

## 2023-05-25 DIAGNOSIS — R109 Unspecified abdominal pain: Secondary | ICD-10-CM | POA: Diagnosis not present

## 2023-05-25 DIAGNOSIS — R197 Diarrhea, unspecified: Secondary | ICD-10-CM | POA: Diagnosis not present

## 2023-05-25 DIAGNOSIS — N3000 Acute cystitis without hematuria: Secondary | ICD-10-CM | POA: Diagnosis not present

## 2023-05-25 DIAGNOSIS — K8689 Other specified diseases of pancreas: Secondary | ICD-10-CM | POA: Diagnosis not present

## 2023-05-25 LAB — CBC WITH DIFFERENTIAL/PLATELET
Abs Immature Granulocytes: 0.03 10*3/uL (ref 0.00–0.07)
Basophils Absolute: 0 10*3/uL (ref 0.0–0.1)
Basophils Relative: 0 %
Eosinophils Absolute: 0.1 10*3/uL (ref 0.0–0.5)
Eosinophils Relative: 3 %
HCT: 35.4 % — ABNORMAL LOW (ref 36.0–46.0)
Hemoglobin: 11.5 g/dL — ABNORMAL LOW (ref 12.0–15.0)
Immature Granulocytes: 1 %
Lymphocytes Relative: 23 %
Lymphs Abs: 1.3 10*3/uL (ref 0.7–4.0)
MCH: 31.7 pg (ref 26.0–34.0)
MCHC: 32.5 g/dL (ref 30.0–36.0)
MCV: 97.5 fL (ref 80.0–100.0)
Monocytes Absolute: 0.7 10*3/uL (ref 0.1–1.0)
Monocytes Relative: 13 %
Neutro Abs: 3.4 10*3/uL (ref 1.7–7.7)
Neutrophils Relative %: 60 %
Platelets: 233 10*3/uL (ref 150–400)
RBC: 3.63 MIL/uL — ABNORMAL LOW (ref 3.87–5.11)
RDW: 12 % (ref 11.5–15.5)
WBC: 5.5 10*3/uL (ref 4.0–10.5)
nRBC: 0 % (ref 0.0–0.2)

## 2023-05-25 LAB — IRON AND TIBC
Iron: 34 ug/dL (ref 28–170)
Saturation Ratios: 10 % — ABNORMAL LOW (ref 10.4–31.8)
TIBC: 340 ug/dL (ref 250–450)
UIBC: 306 ug/dL

## 2023-05-25 LAB — COMPREHENSIVE METABOLIC PANEL
ALT: 11 U/L (ref 0–44)
AST: 13 U/L — ABNORMAL LOW (ref 15–41)
Albumin: 3.4 g/dL — ABNORMAL LOW (ref 3.5–5.0)
Alkaline Phosphatase: 56 U/L (ref 38–126)
Anion gap: 8 (ref 5–15)
BUN: 12 mg/dL (ref 8–23)
CO2: 25 mmol/L (ref 22–32)
Calcium: 8.9 mg/dL (ref 8.9–10.3)
Chloride: 104 mmol/L (ref 98–111)
Creatinine, Ser: 1.06 mg/dL — ABNORMAL HIGH (ref 0.44–1.00)
GFR, Estimated: 54 mL/min — ABNORMAL LOW (ref >60)
Glucose, Bld: 106 mg/dL — ABNORMAL HIGH (ref 70–99)
Potassium: 3.5 mmol/L (ref 3.5–5.1)
Sodium: 137 mmol/L (ref 135–145)
Total Bilirubin: 0.7 mg/dL (ref 0.3–1.2)
Total Protein: 5.7 g/dL — ABNORMAL LOW (ref 6.5–8.1)

## 2023-05-25 LAB — RETICULOCYTES
Immature Retic Fract: 12.4 % (ref 2.3–15.9)
RBC.: 3.54 MIL/uL — ABNORMAL LOW (ref 3.87–5.11)
Retic Count, Absolute: 111.5 10*3/uL (ref 19.0–186.0)
Retic Ct Pct: 3.2 % — ABNORMAL HIGH (ref 0.4–3.1)

## 2023-05-25 LAB — PHOSPHORUS: Phosphorus: 4.1 mg/dL (ref 2.5–4.6)

## 2023-05-25 LAB — VITAMIN B12: Vitamin B-12: 218 pg/mL (ref 180–914)

## 2023-05-25 LAB — MAGNESIUM: Magnesium: 2 mg/dL (ref 1.7–2.4)

## 2023-05-25 LAB — FOLATE: Folate: 10 ng/mL (ref >5.9)

## 2023-05-25 LAB — FERRITIN: Ferritin: 10 ng/mL — ABNORMAL LOW (ref 11–307)

## 2023-05-25 MED ORDER — POTASSIUM CHLORIDE 20 MEQ PO PACK
40.0000 meq | PACK | Freq: Two times a day (BID) | ORAL | Status: AC
  Administered 2023-05-25: 40 meq via ORAL
  Filled 2023-05-25 (×2): qty 2

## 2023-05-25 MED ORDER — SODIUM CHLORIDE 0.9 % IV SOLN: INTRAVENOUS | Status: DC

## 2023-05-25 MED ORDER — POTASSIUM CHLORIDE CRYS ER 20 MEQ PO TBCR: 40.0000 meq | EXTENDED_RELEASE_TABLET | Freq: Two times a day (BID) | ORAL | Status: DC

## 2023-05-25 NOTE — Plan of Care (Signed)
  Problem: Education: Goal: Knowledge of General Education information will improve Description: Including pain rating scale, medication(s)/side effects and non-pharmacologic comfort measures Outcome: Progressing   Problem: Clinical Measurements: Goal: Ability to maintain clinical measurements within normal limits will improve Outcome: Progressing   Problem: Activity: Goal: Risk for activity intolerance will decrease Outcome: Progressing   Problem: Nutrition: Goal: Adequate nutrition will be maintained Outcome: Progressing   Problem: Pain Managment: Goal: General experience of comfort will improve Outcome: Progressing   Problem: Safety: Goal: Ability to remain free from injury will improve Outcome: Progressing   Problem: Education: Goal: Knowledge of the prescribed therapeutic regimen will improve Outcome: Progressing   Problem: Respiratory: Goal: Ability to maintain a clear airway will improve Outcome: Progressing

## 2023-05-25 NOTE — Progress Notes (Signed)
PROGRESS NOTE    [name]  ZOX:[id number] DOB: 1946-06-22 DOA: 05/22/2023 PCP: Chilton Greathouse, MD   Brief Narrative:  HPI Per Dr. Tereasa Coop on 05/22/23:  [name] is a 77 y.o. 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  Patient reported that she has generalized abdominal pain and mostly on the right side and she has bladder spasm as well.  Patient reported having nausea, vomiting and diarrhea for last 2 days.  She has 7-10 episodes of small-volume stool.  Abdominal pain is a spasmodic in quality, and there is no associated relieving or aggravating factor.  Patient also has poor oral tolerance and vomiting for last 2 days as well.  No family member at home is sick at this time.  Denies any eating outside seafood or old food from the fridge.  Denies any recent use of antibiotic. Patient is complaining about bladder spasm with associated dysuria.  She denies any fever and chill however she has generalized weakness and poor oral tolerance. Denies any other complaint at this time.  **Interim History GI Pathogen Panel Positive for EPEC and Norovirus. Continued IV Ceftriaxone today and since Urine Cx showed NG will discontinue after tomorrow. Discussed with GI for further evaluation and they will work up biliary ductal dilatation with outpatient EUS.  PT OT recommending home health.  Patient was improving but felt worse today so will resume IVF given her multiple bowel movements overnight.  Anticipate discharging  the next 24 to 48 hours if she improves  Assessment and Plan:  Acute Cystitis -Patient presenting with complaining of bladder spasm, dysuria and increased urinary urgency for last 3  to 4 days.  Patient has severe bladder spasm and pain.  Patient also has diarrhea, nausea and vomiting and poor oral tolerance. -In the ER presentation patient is hemodynamically stable. -CBC no evidence of leukocytosis. -UA showed evidence of UTI but urine culture showed no growth but still having Symptoms -Per chart review patient has UTI in April 2024 while she was treating with ceftriaxone also found to have concomitant infection with C. difficile and ceftriaxone has been transitioned to oral vancomycin. -Currently treating patient empirically with IV ceftriaxone and will complete at least 5 Days. Checking C. difficile panel and was negative.  -Continue to monitor fever curve WBC. -Will follow-up with urine culture for appropriate antibiotic guidance and pending. -Starting oxybutynin 5 mg daily. -Continue to monitor urine output.   Abdominal Pain, Nausea and Vomiting, Diarrhea in the setting of EPEC and Norovirus -Patient reported generalized abdominal pain with most pain to her right upper quadrant and suprapubic pain.  She is also complaining about nausea, vomiting and diarrhea for last 2 days.  She is afebrile.  No leukocytosis.  Per chart review patient had C. difficile associated diarrhea in April 2024.  Possibly patient might have another episode now however I have low suspicion now given the nature of acute onset of symptoms likely viral gastroenteritis origin. -CBC no evidence of leukocytosis and did show some Mild Renal Insufficiency  -CT abdomen pelvis: Showed mildly progressive biliary ductal dilation and a stable pancreatic duct dilation. -Checking GI panel and C. difficile panel; C Diff Negative but GI Pathogen Panel POSITIVE FOR Enteropathogenic E Coli and Norovirus -Continue IV  Ceftriaxone for now  -Maintenance fluid D5 LR with KCl 10 mEq 75 mL/hr now stopped but will resume with normal saline at 75 mL/h -Chronic abdominal pain with secondary to underlying chronic biliary ductal  dilation and chronic pancreatic duct dilation. -Continue Dilaudid as needed and will add low-dose tramadol for pain control -Continue Zofran as needed -GI evaluated patient CT scan recommending outpatient follow-up with a EUS -The patient improved yesterday however today she worsened and started having similar symptoms again.  Will resume IV fluid hydration    History of chronic pancreatic dilation Pancreatic divisum History of cholecystectomy -CT abdomen pelvis showed progressive biliary ductal dilation and stable pancreatic dilation.  Per chart review of GI note expected postsurgical dilation of the common bile duct. -Normal hepatic function panel and normal lipase level. -Continue to monitor liver function.    History of Schatzki Ring History of esophageal dysmotility History of chronic abdominal pain Dilation of the common bile duct Dilation of the pancreatic duct -While patient was admitted in April she has been evaluated by [facility] Dr. Erik Obey.  Per chart review of the note EGD from 2021 showed tortuous esophagus, small hiatal hernia with partial Schatzki ring and chronic gastritis without evidence of H. pylori and dyspepsia.  Barium swallow study March 2023 showed significant esophageal dysmotility related to aging without evidence of stricture.   -Patient has been previously seen at [facility] for nausea vomiting and she is on Zofran at home as needed. -Continue Dysphagia Level 3 Diet. -C/w Supportive Care and Treat Infections as above -Discussed with GI who will evaluate the patient in the outpatient setting with an EUS   Hypokalemia -Patient's K+ Level Trend: Recent Labs  Lab 05/22/23 1639 05/23/23 0505 05/24/23 0541 05/25/23 0311  K 3.3* 3.4* 4.0 3.5  -IVF with D5 in LR + 20 mEQ at 75 mL/hr has now stopped but will restart IVF with NS at 75 mL/hr -Replete with po KCL 40 mEQ BID -Continue to Monitor and Replete as Necessary -Repeat CMP in the AM   Hypomagnesemia,  improved  -Patient's Mag Level Trend: Recent Labs  Lab 05/22/23 1639 05/24/23 0541 05/25/23 0311  MG 1.9 1.6* 2.0  -Continue to Monitor and Replete as Necessary -Repeat Mag in the AM   Hypophosphatemia -Phos Level Trend: Recent Labs  Lab 05/24/23 0541 05/25/23 0311  PHOS 2.3* 4.1  -Replete with po K Phos 500 mg po BID x2 -Continue to Monitor and repeat Phos in the AM  Hyponatremia -Serum sodium 134.  Mild hyponatremia in the setting of poor oral intake. -Na+ Trend:  Recent Labs  Lab 05/22/23 1639 05/23/23 0505 05/24/23 0541 05/25/23 0311  NA 134* 137 136 137  -Continue to monitor sodium level closely -Repeat CBC in the AM   Paroxysmal atrial fibrillation -EKG showing atrial fibrillation heart rate 125. -Resumed home bisoprolol 25 mg daily and continue Eliquis 5 mg twice daily.   Essential hypertension but was a little Hypotensive -Continue Bisoprolol 2.5 mg daily.   -Holding Lasix as patient is euvolemic on physical exam. -Given a 500 mL Bolus -Continue to Monitor BP per Protocol -Last BP reading was 97/66   Hypothyroidism -Continue Levothyroxine 88 mcg po Daily   Normocytic Anemia -Hgb/Hct Trend: Recent Labs  Lab 05/22/23 1639 05/23/23 0505 05/24/23 0541 05/25/23 0311  HGB 14.0 12.9 10.9* 11.5*  HCT 41.0 38.6 34.5* 35.4*  MCV 91.7 94.6 99.7 97.5  -Checked Anemia Panel showed an iron level of 34, UIBC of 306, TIBC of 340,  saturation ratios of 10%, ferritin level 10, folate level 10.0, vitamin B12 218 -Continue to Monitor for S/Sx of Bleeding; No overt bleeding noted -Repeat CBC in the AM   Chronic Pain Syndrome -Patient has history of chronic syndrome.  She used to be on chronic fentanyl patch which has been gradually weaned off in April 2024. -Currently she takes gabapentin 600 mg at bedtime which we will continue   History of CVA -Patient is not on Aspirin unclear etiology. -Continue Eliquis 5 mg twice daily (also for management of PACs Mantel  fibrillation) -Continue Atorvastatin 10 mg daily.   Generalized Anxiety Disorder -Continue Wellbutrin 75 mg daily and Fluoxetine 10 mg daily   GERD -Continue Omeprazole 20 mg daily substitution with Pantoprazole 40 mg po Daily  Hypoalbuminemia -Patient's Albumin Trend: Recent Labs  Lab 05/22/23 1639 05/23/23 0505 05/24/23 0541 05/25/23 0311  ALBUMIN 4.6 3.7 3.3* 3.4*  -Continue to Monitor and Trend and repeat CMP in the AM   DVT prophylaxis: SCDs Start: 05/22/23 2101 Place TED hose Start: 05/22/23 2101 apixaban (ELIQUIS) tablet 5 mg    Code Status: Full Code Family Communication: Discussed with Husband at bedside  Disposition Plan:  Level of care: Med-Surg Status is: Inpatient Remains inpatient appropriate because: Needs further clinical improvement and anticipating D/C in the next 24-48 hours if stable   Consultants:  Discussed with GI  Procedures:  As delineated as above  Antimicrobials:  Anti-infectives (From admission, onward)    Start     Dose/Rate Route Frequency Ordered Stop   05/23/23 2000  cefTRIAXone (ROCEPHIN) 1 g in sodium chloride 0.9 % 100 mL IVPB  Status:  Discontinued        1 g 200 mL/hr over 30 Minutes Intravenous Every 24 hours 05/22/23 2100 05/22/23 2114   05/23/23 2000  cefTRIAXone (ROCEPHIN) 1 g in sodium chloride 0.9 % 100 mL IVPB        1 g 200 mL/hr over 30 Minutes Intravenous Every 24 hours 05/22/23 2131 05/27/23 1959   05/22/23 1945  cefTRIAXone (ROCEPHIN) 1 g in sodium chloride 0.9 % 100 mL IVPB        1 g 200 mL/hr over 30 Minutes Intravenous  Once 05/22/23 1932 05/22/23 2056       Subjective: Patient examined at bedside and she is not feeling as good as she was yesterday.  Had some nausea and states that she had multiple bowel movements after lunch yesterday.  States that she is having some yellow stools.  No chest pain or shortness of breath.  No other concerns or complaints at this time.  Objective: Vitals:   05/25/23 0439  05/25/23 0440 05/25/23 0820 05/25/23 1431  BP: 119/72  127/78 97/66  Pulse: 62  71 79  Resp: 18   17  Temp: 97.7 F (36.5 C)  98 F (36.7 C) 98.2 F (36.8 C)  TempSrc: Oral  Oral   SpO2: 97%  94% 99%  Weight:  66.1 kg      Intake/Output Summary (Last 24 hours) at 05/25/2023 1925 Last data filed at 05/25/2023 1832 Gross per 24 hour  Intake 560 ml  Output --  Net 560 ml   Filed Weights   05/23/23 0444 05/24/23 0500 05/25/23 0440  Weight: 64 kg 67 kg 66.1 kg   Examination: Physical Exam:  Constitutional: WN/WD elderly Caucasian female in no acute distress appears a little uncomfortable Respiratory: Diminished to auscultation bilaterally, no wheezing, rales, rhonchi or crackles. Normal respiratory effort and patient is not  tachypenic. No accessory muscle use.  Unlabored breathing Cardiovascular: RRR, no murmurs / rubs / gallops. S1 and S2 auscultated. No extremity edema.   Abdomen: Soft, non-tender, non-distended. Bowel sounds positive.  GU: Deferred. Musculoskeletal: No clubbing / cyanosis of digits/nails. No joint deformity upper and lower extremities.  Skin: No rashes, lesions, ulcers on a limited skin evaluation. No induration; Warm and dry.  Neurologic: CN 2-12 grossly intact with no focal deficits. Romberg sign and cerebellar reflexes not assessed.  Psychiatric: Normal judgment and insight. Alert and oriented x 3. Normal mood and appropriate affect.   Data Reviewed: I have personally reviewed following labs and imaging studies  CBC: Recent Labs  Lab 05/22/23 1639 05/23/23 0505 05/24/23 0541 05/25/23 0311  WBC 7.4 9.6 5.8 5.5  NEUTROABS 6.0  --  3.5 3.4  HGB 14.0 12.9 10.9* 11.5*  HCT 41.0 38.6 34.5* 35.4*  MCV 91.7 94.6 99.7 97.5  PLT 292 278 215 233   Basic Metabolic Panel: Recent Labs  Lab 05/22/23 1639 05/23/23 0505 05/24/23 0541 05/25/23 0311  NA 134* 137 136 137  K 3.3* 3.4* 4.0 3.5  CL 100 103 103 104  CO2 22 22 25 25   GLUCOSE 130* 118* 102* 106*   BUN 17 18 14 12   CREATININE 0.96 1.13* 1.14* 1.06*  CALCIUM 10.0 9.6 9.1 8.9  MG 1.9  --  1.6* 2.0  PHOS  --   --  2.3* 4.1   GFR: Estimated Creatinine Clearance: 39.4 mL/min (A) (by C-G formula based on SCr of 1.06 mg/dL (H)). Liver Function Tests: Recent Labs  Lab 05/22/23 1639 05/23/23 0505 05/24/23 0541 05/25/23 0311  AST 16 16 13* 13*  ALT 12 12 12 11   ALKPHOS 82 71 60 56  BILITOT 1.6* 0.8 0.8 0.7  PROT 7.5 6.7 5.7* 5.7*  ALBUMIN 4.6 3.7 3.3* 3.4*   Recent Labs  Lab 05/22/23 1639  LIPASE 27   No results for input(s): "AMMONIA" in the last 168 hours. Coagulation Profile: No results for input(s): "INR", "PROTIME" in the last 168 hours. Cardiac Enzymes: No results for input(s): "CKTOTAL", "CKMB", "CKMBINDEX", "TROPONINI" in the last 168 hours. BNP (last 3 results) No results for input(s): "PROBNP" in the last 8760 hours. HbA1C: No results for input(s): "HGBA1C" in the last 72 hours. CBG: No results for input(s): "GLUCAP" in the last 168 hours. Lipid Profile: No results for input(s): "CHOL", "HDL", "LDLCALC", "TRIG", "CHOLHDL", "LDLDIRECT" in the last 72 hours. Thyroid Function Tests: No results for input(s): "TSH", "T4TOTAL", "FREET4", "T3FREE", "THYROIDAB" in the last 72 hours. Anemia Panel: Recent Labs    05/25/23 0311  VITAMINB12 218  FOLATE 10.0  FERRITIN 10*  TIBC 340  IRON 34  RETICCTPCT 3.2*   Sepsis Labs: No results for input(s): "PROCALCITON", "LATICACIDVEN" in the last 168 hours.  Recent Results (from the past 240 hour(s))  Urine Culture     Status: None   Collection Time: 05/22/23  8:08 PM   Specimen: Urine, Clean Catch  Result Value Ref Range Status   Specimen Description   Final    URINE, CLEAN CATCH Performed at [facility], [address], [place], [place] [address]    Special Requests   Final    NONE Performed at [facility], [address], [place], [place] [address]    Culture   Final     NO GROWTH Performed at [facility], [address] [place], [place] [address]    Report Status 05/24/2023  FINAL  Final  Gastrointestinal Panel by PCR , Stool     Status: Abnormal   Collection Time: 05/23/23 12:09 AM   Specimen: Stool  Result Value Ref Range Status   Campylobacter species NOT DETECTED NOT DETECTED Final   Plesimonas shigelloides NOT DETECTED NOT DETECTED Final   Salmonella species NOT DETECTED NOT DETECTED Final   Yersinia enterocolitica NOT DETECTED NOT DETECTED Final   Vibrio species NOT DETECTED NOT DETECTED Final   Vibrio cholerae NOT DETECTED NOT DETECTED Final   Enteroaggregative E coli (EAEC) NOT DETECTED NOT DETECTED Final   Enteropathogenic E coli (EPEC) DETECTED (A) NOT DETECTED Final    Comment: RESULT CALLED TO, READ BACK BY AND VERIFIED WITH: REBECCA FEWELL 05/23/23 1423 MW    Enterotoxigenic E coli (ETEC) NOT DETECTED NOT DETECTED Final   Shiga like toxin producing E coli (STEC) NOT DETECTED NOT DETECTED Final   Shigella/Enteroinvasive E coli (EIEC) NOT DETECTED NOT DETECTED Final   Cryptosporidium NOT DETECTED NOT DETECTED Final   Cyclospora cayetanensis NOT DETECTED NOT DETECTED Final   Entamoeba histolytica NOT DETECTED NOT DETECTED Final   Giardia lamblia NOT DETECTED NOT DETECTED Final   Adenovirus F40/41 NOT DETECTED NOT DETECTED Final   Astrovirus NOT DETECTED NOT DETECTED Final   Norovirus GI/GII DETECTED (A) NOT DETECTED Final    Comment: RESULT CALLED TO, READ BACK BY AND VERIFIED WITH: REBECCA FEWELL 05/22/22 1423 MW    Rotavirus A NOT DETECTED NOT DETECTED Final   Sapovirus (I, II, IV, and V) NOT DETECTED NOT DETECTED Final    Comment: Performed at [facility], [address] [place], [place] [address]  C Difficile Quick Screen w PCR reflex     Status: None   Collection Time: 05/23/23 12:09 AM   Specimen: Stool  Result Value Ref Range Status   C Diff antigen NEGATIVE NEGATIVE Final   C Diff toxin NEGATIVE NEGATIVE  Final   C Diff interpretation No C. difficile detected.  Final    Comment: Performed at [facility], [address], [place], [place] [address]    Radiology Studies: No results found.  Scheduled Meds:  apixaban  5 mg Oral BID   atorvastatin  10 mg Oral Daily   bisoprolol  2.5 mg Oral Daily   buPROPion  75 mg Oral q morning   FLUoxetine  30 mg Oral QHS   gabapentin  600 mg Oral QHS   levothyroxine  88 mcg Oral Q0600   oxybutynin  5 mg Oral Daily   pantoprazole  40 mg Oral Daily   potassium chloride  40 mEq Oral BID   sodium chloride flush  10-40 mL Intracatheter Q12H   sodium chloride flush  3 mL Intravenous Q12H   Continuous Infusions:  sodium chloride     sodium chloride 75 mL/hr at 05/25/23 1313   cefTRIAXone (ROCEPHIN)  IV 1 g (05/25/23 1913)    LOS: 3 days   Marguerita Merles, DO [facility] Available via Epic secure chat 7am-7pm After these hours, please refer to coverage provider listed on [email or web address] 05/25/2023, 7:25 PM

## 2023-05-25 NOTE — Progress Notes (Signed)
Mobility Specialist - Progress Note   05/25/23 1132  Mobility  Activity Ambulated with assistance to bathroom;Ambulated with assistance in hallway  Level of Assistance Minimal assist, patient does 75% or more  Assistive Device Front wheel walker  Distance Ambulated (ft) 50 ft  Range of Motion/Exercises Active  Activity Response Tolerated fair  Mobility Referral Yes  $Mobility charge 1 Mobility  Mobility Specialist Start Time (ACUTE ONLY) 1115  Mobility Specialist Stop Time (ACUTE ONLY) 1132  Mobility Specialist Time Calculation (min) (ACUTE ONLY) 17 min   Pt was found in bed and agreeable to ambulate. Pt was min-A for bed mobility and STS, CG for ambulation. Pt to bathroom prior to ambulation in hallway. Limited distance in hallway due to pain on R knee and hip. Pt returned to bed with all needs met. Call bell in reach and husband in room.  Billey Chang Mobility Specialist

## 2023-05-25 NOTE — Progress Notes (Signed)
Physical Therapy Treatment Patient Details Name: [name] MRN: [id number] DOB: 05-22-1946 Today's Date: 05/25/2023   History of Present Illness 77 y.o. 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 Dx of cystitis, abdominal pain with Nausea/vomiting/diarrhea likely due to viral gastroenteritis.    PT Comments  Pt is progressing well with mobility, she tolerated increased ambulation distance of 1' with RW, no loss of balance, pt reports chronic R knee pain with walking 2* OA, she's not a candidate for TKA 2* cardiac issues. Pt performed seated BUE/LE strengthening exercises and tolerated them well.     If plan is discharge home, recommend the following: A little help with walking and/or transfers;A little help with bathing/dressing/bathroom;Assistance with cooking/housework;Assist for transportation;Help with stairs or ramp for entrance   Can travel by private vehicle        Equipment Recommendations  None recommended by PT    Recommendations for Other Services       Precautions / Restrictions Precautions Precautions: Fall Precaution Comments: denies falls in past 6 months Restrictions Weight Bearing Restrictions: No     Mobility  Bed Mobility               General bed mobility comments: up in bathroom    Transfers Overall transfer level: Needs assistance Equipment used: Rolling walker (2 wheels) Transfers: Sit to/from Stand Sit to Stand: Supervision           General transfer comment: VCs hand placement    Ambulation/Gait Ambulation/Gait assistance: Contact guard assist Gait Distance (Feet): 85 Feet Assistive device: Rolling walker (2  wheels) Gait Pattern/deviations: Step-to pattern, Trunk flexed Gait velocity: decr     General Gait Details: VCs for positioning in RW and for posture   Stairs             Wheelchair Mobility     Tilt Bed    Modified Rankin (Stroke Patients Only)       Balance Overall balance assessment: Needs assistance Sitting-balance support: Feet supported Sitting balance-Leahy Scale: Good     Standing balance support: During functional activity, Bilateral upper extremity supported Standing balance-Leahy Scale: Poor                              Cognition Arousal: Alert Behavior During Therapy: WFL for tasks assessed/performed Overall Cognitive Status: Within Functional Limits for tasks assessed                                          Exercises General Exercises - Upper Extremity Shoulder Flexion: AROM, Both, 10 reps, Seated General Exercises - Lower Extremity Long Arc Quad: AROM, Both, 15 reps, Seated Hip Flexion/Marching: AROM, Both, 10 reps, Seated    General Comments        Pertinent Vitals/Pain Pain Assessment Pain Score: 7  Pain Location: R knee with walking (chronic) Pain Descriptors / Indicators: Aching Pain Intervention(s): Limited activity within patient's tolerance, Monitored during session, Repositioned    Home Living  Prior Function            PT Goals (current goals can now be found in the care plan section) Acute Rehab PT Goals Patient Stated Goal: to be able to walk around at home PT Goal Formulation: With patient/family Time For Goal Achievement: 06/06/23 Potential to Achieve Goals: Good Progress towards PT goals: Progressing toward goals    Frequency    Min 1X/week      PT Plan      Co-evaluation              AM-PAC PT "6 Clicks" Mobility   Outcome Measure  Help needed turning from your back to your side while in a flat bed without using bedrails?:  None Help needed moving from lying on your back to sitting on the side of a flat bed without using bedrails?: A Little Help needed moving to and from a bed to a chair (including a wheelchair)?: A Little Help needed standing up from a chair using your arms (e.g., wheelchair or bedside chair)?: None Help needed to walk in hospital room?: A Little Help needed climbing 3-5 steps with a railing? : A Little 6 Click Score: 20    End of Session   Activity Tolerance: Patient tolerated treatment well Patient left: in chair;with call bell/phone within reach;with family/visitor present Nurse Communication: Mobility status PT Visit Diagnosis: Difficulty in walking, not elsewhere classified (R26.2);Pain Pain - Right/Left: Right Pain - part of body: Knee     Time: 3086-5784 PT Time Calculation (min) (ACUTE ONLY): 17 min  Charges:    $Gait Training: 8-22 mins PT General Charges $$ ACUTE PT VISIT: 1 Visit                     Tamala Ser PT 05/25/2023  Acute Rehabilitation Services  Office [phone]

## 2023-05-25 NOTE — Plan of Care (Signed)
Plan of Care reviewed. 

## 2023-05-26 DIAGNOSIS — R109 Unspecified abdominal pain: Secondary | ICD-10-CM | POA: Diagnosis not present

## 2023-05-26 DIAGNOSIS — R197 Diarrhea, unspecified: Secondary | ICD-10-CM | POA: Diagnosis not present

## 2023-05-26 DIAGNOSIS — K8689 Other specified diseases of pancreas: Secondary | ICD-10-CM | POA: Diagnosis not present

## 2023-05-26 DIAGNOSIS — N3000 Acute cystitis without hematuria: Secondary | ICD-10-CM | POA: Diagnosis not present

## 2023-05-26 LAB — PHOSPHORUS: Phosphorus: 3.4 mg/dL (ref 2.5–4.6)

## 2023-05-26 LAB — CBC WITH DIFFERENTIAL/PLATELET
Abs Immature Granulocytes: 0.03 10*3/uL (ref 0.00–0.07)
Basophils Absolute: 0 10*3/uL (ref 0.0–0.1)
Basophils Relative: 0 %
Eosinophils Absolute: 0.1 10*3/uL (ref 0.0–0.5)
Eosinophils Relative: 3 %
HCT: 33.1 % — ABNORMAL LOW (ref 36.0–46.0)
Hemoglobin: 10.7 g/dL — ABNORMAL LOW (ref 12.0–15.0)
Immature Granulocytes: 1 %
Lymphocytes Relative: 26 %
Lymphs Abs: 1.2 10*3/uL (ref 0.7–4.0)
MCH: 31.5 pg (ref 26.0–34.0)
MCHC: 32.3 g/dL (ref 30.0–36.0)
MCV: 97.4 fL (ref 80.0–100.0)
Monocytes Absolute: 0.6 10*3/uL (ref 0.1–1.0)
Monocytes Relative: 13 %
Neutro Abs: 2.7 10*3/uL (ref 1.7–7.7)
Neutrophils Relative %: 57 %
Platelets: 188 10*3/uL (ref 150–400)
RBC: 3.4 MIL/uL — ABNORMAL LOW (ref 3.87–5.11)
RDW: 12.1 % (ref 11.5–15.5)
WBC: 4.7 10*3/uL (ref 4.0–10.5)
nRBC: 0 % (ref 0.0–0.2)

## 2023-05-26 LAB — COMPREHENSIVE METABOLIC PANEL
ALT: 11 U/L (ref 0–44)
AST: 12 U/L — ABNORMAL LOW (ref 15–41)
Albumin: 3.2 g/dL — ABNORMAL LOW (ref 3.5–5.0)
Alkaline Phosphatase: 54 U/L (ref 38–126)
Anion gap: 9 (ref 5–15)
BUN: 11 mg/dL (ref 8–23)
CO2: 24 mmol/L (ref 22–32)
Calcium: 8.5 mg/dL — ABNORMAL LOW (ref 8.9–10.3)
Chloride: 105 mmol/L (ref 98–111)
Creatinine, Ser: 0.98 mg/dL (ref 0.44–1.00)
GFR, Estimated: 59 mL/min — ABNORMAL LOW (ref >60)
Glucose, Bld: 96 mg/dL (ref 70–99)
Potassium: 3.4 mmol/L — ABNORMAL LOW (ref 3.5–5.1)
Sodium: 138 mmol/L (ref 135–145)
Total Bilirubin: 0.6 mg/dL (ref 0.3–1.2)
Total Protein: 5.3 g/dL — ABNORMAL LOW (ref 6.5–8.1)

## 2023-05-26 LAB — MAGNESIUM: Magnesium: 1.8 mg/dL (ref 1.7–2.4)

## 2023-05-26 MED ORDER — ONDANSETRON 4 MG PO TBDP: 4.0000 mg | ORAL_TABLET | Freq: Three times a day (TID) | ORAL | 0 refills | Status: DC | PRN

## 2023-05-26 MED ORDER — TRAMADOL HCL 50 MG PO TABS: 50.0000 mg | ORAL_TABLET | Freq: Four times a day (QID) | ORAL | 0 refills | Status: AC | PRN

## 2023-05-26 MED ORDER — POTASSIUM CHLORIDE CRYS ER 20 MEQ PO TBCR
40.0000 meq | EXTENDED_RELEASE_TABLET | Freq: Two times a day (BID) | ORAL | Status: DC
  Administered 2023-05-26: 40 meq via ORAL
  Filled 2023-05-26: qty 2

## 2023-05-26 MED ORDER — MAGNESIUM SULFATE 2 GM/50ML IV SOLN
2.0000 g | Freq: Once | INTRAVENOUS | Status: AC
  Administered 2023-05-26: 2 g via INTRAVENOUS
  Filled 2023-05-26: qty 50

## 2023-05-26 MED ORDER — OXYBUTYNIN CHLORIDE 5 MG PO TABS: 5.0000 mg | ORAL_TABLET | Freq: Every day | ORAL | 0 refills | Status: DC

## 2023-05-26 NOTE — Plan of Care (Signed)

## 2023-05-26 NOTE — TOC Transition Note (Signed)
Transition of Care [facility]) - CM/SW Discharge Note   Patient Details  Name: [name] MRN: [id number] Date of Birth: 08/29/1945  [facility] CM/SW Contact:  Adrian Prows, RN Phone Number: 05/26/2023, 3:41 PM   Clinical Narrative:    D/C orders received; [facility] services previously accepted by [facility]; Katina at agency notified pt d/c today; no TOC needs.   Final next level of care: Home w Home Health Services Barriers to Discharge: No Barriers Identified   Patient Goals and CMS Choice CMS Medicare.gov Compare Post Acute Care list provided to:: Patient Represenative (must comment) Choice offered to / list presented to : Adult Children  Discharge Placement                         Discharge Plan and Services Additional resources added to the After Visit Summary for     Discharge Planning Services: CM Consult Post Acute Care Choice: Home Health                    HH Arranged: PT [facility] Agency: [facility] Date [facility] Agency Contacted: 05/24/23 Time HH Agency Contacted: 1514 Representative spoke with at [facility] Agency: Tresa Endo  Social Determinants of Health (SDOH) Interventions SDOH Screenings   Food Insecurity: No Food Insecurity (05/22/2023)  Housing: Low Risk  (05/22/2023)  Transportation Needs: No Transportation Needs (05/22/2023)  Utilities: Not At Risk (05/22/2023)  Depression (PHQ2-9): Low Risk  (02/28/2023)  Tobacco Use: Medium Risk (05/22/2023)     Readmission Risk Interventions    05/23/2023   10:20 AM  Readmission Risk Prevention Plan  Transportation Screening Complete  PCP or Specialist Appt within 5-7 Days Complete  Home Care Screening Complete  Medication Review (RN CM) Complete

## 2023-05-26 NOTE — Progress Notes (Signed)
Epic chat sent to [place]. Patient reported no issues tolerating lunch. Provider made aware.

## 2023-05-27 DIAGNOSIS — I69398 Other sequelae of cerebral infarction: Secondary | ICD-10-CM | POA: Diagnosis not present

## 2023-05-27 DIAGNOSIS — H5461 Unqualified visual loss, right eye, normal vision left eye: Secondary | ICD-10-CM | POA: Diagnosis not present

## 2023-05-27 DIAGNOSIS — I722 Aneurysm of renal artery: Secondary | ICD-10-CM | POA: Diagnosis not present

## 2023-05-27 DIAGNOSIS — E039 Hypothyroidism, unspecified: Secondary | ICD-10-CM | POA: Diagnosis not present

## 2023-05-27 DIAGNOSIS — D630 Anemia in neoplastic disease: Secondary | ICD-10-CM | POA: Diagnosis not present

## 2023-05-27 DIAGNOSIS — J449 Chronic obstructive pulmonary disease, unspecified: Secondary | ICD-10-CM | POA: Diagnosis not present

## 2023-05-27 DIAGNOSIS — N3001 Acute cystitis with hematuria: Secondary | ICD-10-CM | POA: Diagnosis not present

## 2023-05-27 DIAGNOSIS — I4819 Other persistent atrial fibrillation: Secondary | ICD-10-CM | POA: Diagnosis not present

## 2023-05-27 DIAGNOSIS — C342 Malignant neoplasm of middle lobe, bronchus or lung: Secondary | ICD-10-CM | POA: Diagnosis not present

## 2023-05-28 ENCOUNTER — Other Ambulatory Visit: Payer: Self-pay

## 2023-05-28 ENCOUNTER — Ambulatory Visit
Admission: RE | Admit: 2023-05-28 | Discharge: 2023-05-28 | Disposition: A | Discharge status: Home / Self Care | Payer: Medicare HMO | Source: Ambulatory Visit | Attending: Radiation Oncology | Admitting: Radiation Oncology

## 2023-05-28 NOTE — Discharge Summary (Signed)
Physician Discharge Summary   Patient: [name] MRN: [id number] DOB: [birth year]  Admit date:     05/22/2023  Discharge date: 05/26/2023  Discharge Physician: Marguerita Merles, DO   PCP: Chilton Greathouse, MD   Recommendations at discharge:   Follow-up with PCP within 1 to 2 weeks and repeat CBC, CMP, mag, Phos within 1 week Follow-up with Gastroenterology in outpatient setting for EUS and further workup Follow-up with urology in outpatient setting for chronic hematuria Follow-up with radiation oncology and medical oncology for non-small cell lung cancer and adenocarcinoma  Discharge Diagnoses: Principal Problem:   Acute cystitis Active Problems:   Hypokalemia   Nausea and vomiting   Diarrhea   Dilation of pancreatic duct   Abdominal pain   Hyperlipidemia   Essential hypertension   Paroxysmal A-fib (HCC)   Hypothyroidism   Chronic pain syndrome   GERD (gastroesophageal reflux disease)   History of CVA (cerebrovascular accident)   GAD (generalized anxiety disorder)  Resolved Problems:   * No resolved hospital problems. [place] [facility] Course: HPI Per Dr. Tereasa Coop on 05/22/23:  [name] is a 77 y.o. 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  Patient reported that she has generalized abdominal pain and mostly on the right side and she has bladder spasm as well.  Patient reported having nausea, vomiting and diarrhea for last 2 days.  She has 7-10 episodes of small-volume stool.  Abdominal pain is a spasmodic in quality, and there is no associated relieving or aggravating factor.  Patient also has poor oral tolerance and vomiting for last 2 days as well.  No family member at home is  sick at this time.  Denies any eating outside seafood or old food from the fridge.  Denies any recent use of antibiotic. Patient is complaining about bladder spasm with associated dysuria.  She denies any fever and chill however she has generalized weakness and poor oral tolerance. Denies any other complaint at this time.  **Interim History GI Pathogen Panel Positive for EPEC and Norovirus. Continued IV Ceftriaxone today and since Urine Cx showed NG will discontinue after tomorrow. Discussed with GI for further evaluation and they will work up biliary ductal dilatation with outpatient EUS.  PT OT recommending home health.  Patient was improving but felt worse the day before yesterday so IV fluids have been resumed.  She is doing much better today and feels back to her baseline.  She has completed 5 days of antibiotic therapy and she needs no more and is stable for discharge  Assessment and Plan:  Acute Cystitis -Patient presenting with complaining of bladder spasm, dysuria and increased urinary urgency for last 3 to 4 days.  Patient has severe bladder spasm and pain.  Patient also has diarrhea, nausea and vomiting and poor oral tolerance. -In the ER presentation patient is hemodynamically stable. -CBC no evidence of leukocytosis. -UA showed evidence of UTI but urine culture showed no growth but still having Symptoms -Per chart review patient has UTI in April 2024 while she was treating with ceftriaxone also found to have concomitant infection with C. difficile and ceftriaxone has been transitioned to oral vancomycin. -Currently treating patient empirically with IV ceftriaxone and she completes 5 days of therapy today. Checking  C. difficile panel and was negative.  -Continue to monitor fever curve WBC. -Will follow-up with urine culture for appropriate antibiotic guidance and pending. -Starting oxybutynin 5 mg daily. -Continue to monitor urine output.   Abdominal Pain, Nausea and Vomiting,  Diarrhea in the setting of EPEC and Norovirus, removed -Patient reported generalized abdominal pain with most pain to her right upper quadrant and suprapubic pain.  She is also complaining about nausea, vomiting and diarrhea for last 2 days.  She is afebrile.  No leukocytosis.  Per chart review patient had C. difficile associated diarrhea in April 2024.  Possibly patient might have another episode now however I have low suspicion now given the nature of acute onset of symptoms likely viral gastroenteritis origin. -CBC no evidence of leukocytosis and did show some Mild Renal Insufficiency  -CT abdomen pelvis: Showed mildly progressive biliary ductal dilation and a stable pancreatic duct dilation. -Checking GI panel and C. difficile panel; C Diff Negative but GI Pathogen Panel POSITIVE FOR Enteropathogenic E Coli and Norovirus -Continue IV Ceftriaxone for now  -Maintenance fluid D5 LR with KCl 10 mEq 75 mL/hr now stopped but will resume with normal saline at 75 mL/h -Chronic abdominal pain with secondary to underlying chronic biliary ductal dilation and chronic pancreatic duct dilation. -Continue Dilaudid as needed and will add low-dose tramadol for pain control -Continue Zofran as needed -GI evaluated patient CT scan recommending outpatient follow-up with a EUS -Symptoms have essentially resolved and will need to follow-up with her PCP and gastroenterologist in outpatient setting    History of chronic pancreatic dilation Pancreatic divisum History of cholecystectomy -CT abdomen pelvis showed progressive biliary ductal dilation and stable pancreatic dilation.  Per chart review of GI note expected postsurgical dilation of the common bile duct. -Normal hepatic function panel and normal lipase level. -Continue to monitor liver function. -Follow-up with gastroenterology Dr. Rhea Belton in outpatient setting    History of Schatzki Ring History of esophageal dysmotility History of chronic abdominal  pain Dilation of the common bile duct Dilation of the pancreatic duct -While patient was admitted in April she has been evaluated by [facility] Dr. Erik Obey.  Per chart review of the note EGD from 2021 showed tortuous esophagus, small hiatal hernia with partial Schatzki ring and chronic gastritis without evidence of H. pylori and dyspepsia.  Barium swallow study March 2023 showed significant esophageal dysmotility related to aging without evidence of stricture.   -Patient has been previously seen at [facility] for nausea vomiting and she is on Zofran at home as needed. -Continue Dysphagia Level 3 Diet. -C/w Supportive Care and Treat Infections as above -Discussed with GI who will evaluate the patient in the outpatient setting with an EUS with an EUS   Hypokalemia -Patient's K+ Level Trend: Recent Labs  Lab 05/22/23 1639 05/23/23 0505 05/24/23 0541 05/25/23 0311 05/26/23 0311  K 3.3* 3.4* 4.0 3.5 3.4*  -IV fluid hydration to stop -Replete with po KCL 40 mEQ BID prior to discharge -Continue to Monitor and Replete as Necessary -Repeat CMP in the AM   Hypomagnesemia, improved  -Patient's Mag Level Trend: Recent Labs  Lab 05/22/23 1639 05/24/23 0541 05/25/23 0311 05/26/23 0311  MG 1.9 1.6* 2.0 1.8  -Replete with IV mag sulfate 2 g prior to discharge -Continue to Monitor and Replete as Necessary -Repeat Mag in the AM   Hypophosphatemia -Phos Level Trend: Recent Labs  Lab 05/24/23 0541 05/25/23 0311 05/26/23 0311  PHOS 2.3* 4.1 3.4  -Replete with po K Phos  500 mg po BID x2 -Continue to Monitor and repeat Phos in the AM  Hyponatremia -Serum sodium 134.  Mild hyponatremia in the setting of poor oral intake. -Na+ Trend:  Recent Labs  Lab 05/22/23 1639 05/23/23 0505 05/24/23 0541 05/25/23 0311 05/26/23 0311  NA 134* 137 136 137 138  -Continue to monitor sodium level closely -Repeat CBC in the AM   Paroxysmal atrial fibrillation -EKG showing atrial fibrillation  heart rate 125. -Resumed home bisoprolol 25 mg daily and continue Eliquis 5 mg twice daily. -Follow-up in outpatient setting   Essential hypertension but was a little Hypotensive -Continue Bisoprolol 2.5 mg daily.   -Holding Lasix as patient is euvolemic on physical exam. -Given a 500 mL Bolus and was placed back on maintenance IV fluids however blood pressure is now stable -Continue to Monitor BP per Protocol   Hypothyroidism -Continue Levothyroxine 88 mcg po Daily   Hematuria -Chronic -Her urinalysis was done and showed a cloudy appearance with 50 glucose, moderate hemoglobin, 20 ketones, large leukocytes, 100 protein, rare bacteria, greater than 50 RBCs per high-power field, 0-5 squamous epithelial cells and greater than 50 WBCs -Follow-up with Dr. Jettie Pagan in outpatient setting  Normocytic Anemia -Hgb/Hct Trend: Recent Labs  Lab 05/22/23 1639 05/23/23 0505 05/24/23 0541 05/25/23 0311 05/26/23 0311  HGB 14.0 12.9 10.9* 11.5* 10.7*  HCT 41.0 38.6 34.5* 35.4* 33.1*  MCV 91.7 94.6 99.7 97.5 97.4  -Checked Anemia Panel showed an iron level of 34, UIBC of 306, TIBC of 340, saturation ratios of 10%, ferritin level 10, folate level 10.0, vitamin B12 218 -Continue to Monitor for S/Sx of Bleeding; No overt bleeding noted -Repeat CBC within 1 week   Chronic Pain Syndrome -Patient has history of chronic syndrome.  She used to be on chronic fentanyl patch which has been gradually weaned off in April 2024. -Currently she takes gabapentin 600 mg at bedtime which we will continue -Will send her home with low-dose tramadol for discharge   History of CVA -Patient is not on Aspirin unclear etiology. -Continue Eliquis 5 mg twice daily (also for management of PACs Mantel fibrillation) -Continue Atorvastatin 10 mg daily. -Follow-up with neurology in outpatient setting   Generalized Anxiety Disorder -Continue Wellbutrin 75 mg daily and Fluoxetine 10 mg daily  Stage IA2, [id number],  NSCLC, adenocarcinoma of the RML  -Was recently diagnosed with non-small cell lung cancer and adenocarcinoma with a definitive diagnosis on 02/20/2023 -Follow-up outpatient with radiation oncology and medical oncology   GERD -Continue Omeprazole 20 mg daily substitution with Pantoprazole 40 mg po Daily  Hypoalbuminemia -Patient's Albumin Trend: Recent Labs  Lab 05/22/23 1639 05/23/23 0505 05/24/23 0541 05/25/23 0311 05/26/23 0311  ALBUMIN 4.6 3.7 3.3* 3.4* 3.2*  -Continue to Monitor and Trend and repeat CMP within 1 week  Consultants: Discussed with GI Procedures performed: As delineated as above  Disposition: Home Diet recommendation:  Discharge Diet Orders (From admission, onward)     Start     Ordered   05/26/23 0000  Diet - low sodium heart healthy       Comments: Dysphagia 3 Diet   05/26/23 1511           Cardiac diet DISCHARGE MEDICATION: Allergies as of 05/26/2023       Reactions   Penicillins Rash        Medication List     STOP taking these medications    ondansetron 4 MG tablet Commonly known as: ZOFRAN       TAKE  these medications    acetaminophen 325 MG tablet Commonly known as: TYLENOL Take 2 tablets (650 mg total) by mouth every 8 (eight) hours. What changed: when to take this   apixaban 5 MG Tabs tablet Commonly known as: ELIQUIS Take 1 tablet (5 mg total) by mouth 2 (two) times daily.   ascorbic acid 500 MG tablet Commonly known as: VITAMIN C Take 500 mg by mouth daily.   atorvastatin 10 MG tablet Commonly known as: LIPITOR Take 1 tablet (10 mg total) by mouth daily.   bisoprolol 5 MG tablet Commonly known as: ZEBETA Take 2.5 mg by mouth daily.   buPROPion 75 MG tablet Commonly known as: WELLBUTRIN Take 75 mg by mouth every morning.   FLUoxetine 10 MG capsule Commonly known as: PROZAC Take 10 mg by mouth at bedtime.   FLUoxetine 20 MG capsule Commonly known as: PROZAC Take 1 capsule (20 mg total) by mouth at  bedtime.   furosemide 20 MG tablet Commonly known as: Lasix Take 1 tablet (20 mg total) by mouth as needed. What changed: when to take this   gabapentin 600 MG tablet Commonly known as: NEURONTIN Take 600 mg by mouth in the morning and at bedtime.   levothyroxine 88 MCG tablet Commonly known as: Synthroid Take 1 tablet (88 mcg total) by mouth daily before breakfast.   loratadine 10 MG tablet Commonly known as: CLARITIN Take 10 mg by mouth daily.   methenamine 1 g tablet Commonly known as: HIPREX Take 1 g by mouth 2 (two) times daily.   omeprazole 20 MG tablet Commonly known as: PRILOSEC OTC Take 20 mg by mouth daily.   ondansetron 4 MG disintegrating tablet Commonly known as: ZOFRAN-ODT Take 1 tablet (4 mg total) by mouth every 8 (eight) hours as needed for nausea or vomiting.   oxybutynin 5 MG tablet Commonly known as: DITROPAN Take 1 tablet (5 mg total) by mouth daily.   traMADol 50 MG tablet Commonly known as: ULTRAM Take 1 tablet (50 mg total) by mouth every 6 (six) hours as needed for up to 3 days for severe pain.   Vitamin D 125 MCG (5000 UT) Caps Take 5,000 Units by mouth daily.        Follow-up Information     [facility] Follow up.   Specialty: Home Health Services Why: [facility] physical therapy Contact information: [address] [place] [address] [place] [address] [phone]                Discharge Exam: Filed Weights   05/23/23 0444 05/24/23 0500 05/25/23 0440  Weight: 64 kg 67 kg 66.1 kg   Vitals:   05/26/23 0519 05/26/23 0917  BP: (!) 146/78 126/79  Pulse: (!) 59 66  Resp: 18   Temp: 98.2 F (36.8 C) 98.7 F (37.1 C)  SpO2: 99% 98%   Examination: Physical Exam:  Constitutional: Thin elderly Caucasian female in no acute distress Respiratory: Diminished to auscultation bilaterally, no wheezing, rales, rhonchi or crackles. Normal respiratory effort and patient is not tachypenic. No accessory muscle use.  Unlabored  breathing Cardiovascular: RRR, no murmurs / rubs / gallops. S1 and S2 auscultated. No extremity edema.   Abdomen: Soft, non-tender, non-distended. Bowel sounds positive.  GU: Deferred. Musculoskeletal: No clubbing / cyanosis of digits/nails. No joint deformity upper and lower extremities.  Skin: No rashes, lesions, ulcers on limited skin evaluation. No induration; Warm and dry.  Neurologic: CN 2-12 grossly intact with no focal deficits. Romberg sign and  cerebellar reflexes not assessed.  Psychiatric: Normal judgment and insight. Alert and oriented x 3. Normal mood and appropriate affect.   Condition at discharge: stable  The results of significant diagnostics from this hospitalization (including imaging, microbiology, ancillary and laboratory) are listed below for reference.   Imaging Studies: CT ABDOMEN PELVIS W CONTRAST  Result Date: 05/22/2023 CLINICAL DATA:  Increasing right lower quadrant abdominal pain for the past 3 days. EXAM: CT ABDOMEN AND PELVIS WITH CONTRAST TECHNIQUE: Multidetector CT imaging of the abdomen and pelvis was performed using the standard protocol following bolus administration of intravenous contrast. RADIATION DOSE REDUCTION: This exam was performed according to the departmental dose-optimization program which includes automated exposure control, adjustment of the mA and/or kV according to patient size and/or use of iterative reconstruction technique. CONTRAST:  OMNIPAQUE IOHEXOL 300 MG/ML  SOLN COMPARISON:  Nuclear medicine PET-CT dated 01/29/2023. Abdomen and pelvis CT dated 12/07/2022. FINDINGS: Lower chest: Mildly enlarged heart. Minimal linear scarring at the right lung base without significant change. Minimal dependent atelectasis at the posterior right lung base. Hepatobiliary: Stable low-density mass with coarse calcifications at the lateral aspect of the lateral segment of the left lobe of the liver. This previously felt to represent an hemangioma. Additional  hemangioma and cysts are unchanged. Cholecystectomy clips. Mildly progressive extrahepatic and intrahepatic biliary ductal dilatation with common duct diameter of 14.5 mm, previously 11.7 mm. Pancreas: Stable pancreatic ductal dilatation, measuring 4.6 mm in diameter, previously 4.5 mm. Spleen: Normal in size without focal abnormality. Adrenals/Urinary Tract: Normal-appearing adrenal glands. Small left renal simple appearing cyst without significant change. This does not need imaging follow-up. The right kidney is not visualized. The urinary bladder is obscured by streak artifacts from a right hip prosthesis. There is a suggestion of mild-to-moderate diffuse bladder wall thickening with mucosal enhancement. No visible left ureteral abnormality. Stomach/Bowel: Unremarkable stomach, small bowel and colon. Surgically absent appendix. 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 No enlarged lymph nodes. Reproductive: Status post hysterectomy. No adnexal masses. Other: No abdominal wall hernia or abnormality. No abdominopelvic ascites. 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 Interbody and pedicle screw and rod fusion at the L3-4 level with normal alignment. Mild lumbar and lower thoracic spine degenerative changes. IMPRESSION: 1. Mildly progressive biliary ductal dilatation. Correlation with liver function tests is recommended. 2. Stable pancreatic ductal dilatation. 3. Poorly visualized urinary bladder due to streak artifacts with a suggestion of mild-to-moderate diffuse bladder wall thickening with mucosal enhancement, suspicious for cystitis. 4. 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 5. Mild cardiomegaly. Electronically Signed   By: Beckie Salts M.D.   On: 05/22/2023 19:56    Microbiology: Results for orders placed or performed during the hospital encounter of 05/22/23  Urine  Culture     Status: None   Collection Time: 05/22/23  8:08 PM   Specimen: Urine, Clean Catch  Result Value Ref Range Status   Specimen Description   Final    URINE, CLEAN CATCH Performed at [facility], [address], [place], [place] [address]    Special Requests   Final    NONE Performed at [facility], [address] [place], [place] [address]    Culture   Final    NO GROWTH Performed at [facility], [address], [place], [place] [address]    Report Status 05/24/2023 FINAL  Final  Gastrointestinal Panel by PCR , Stool  Status: Abnormal   Collection Time: 05/23/23 12:09 AM   Specimen: Stool  Result Value Ref Range Status   Campylobacter species NOT DETECTED NOT DETECTED Final   Plesimonas shigelloides NOT DETECTED NOT DETECTED Final   Salmonella species NOT DETECTED NOT DETECTED Final   Yersinia enterocolitica NOT DETECTED NOT DETECTED Final   Vibrio species NOT DETECTED NOT DETECTED Final   Vibrio cholerae NOT DETECTED NOT DETECTED Final   Enteroaggregative E coli (EAEC) NOT DETECTED NOT DETECTED Final   Enteropathogenic E coli (EPEC) DETECTED (A) NOT DETECTED Final    Comment: RESULT CALLED TO, READ BACK BY AND VERIFIED WITH: REBECCA FEWELL 05/23/23 1423 MW    Enterotoxigenic E coli (ETEC) NOT DETECTED NOT DETECTED Final   Shiga like toxin producing E coli (STEC) NOT DETECTED NOT DETECTED Final   Shigella/Enteroinvasive E coli (EIEC) NOT DETECTED NOT DETECTED Final   Cryptosporidium NOT DETECTED NOT DETECTED Final   Cyclospora cayetanensis NOT DETECTED NOT DETECTED Final   Entamoeba histolytica NOT DETECTED NOT DETECTED Final   Giardia lamblia NOT DETECTED NOT DETECTED Final   Adenovirus F40/41 NOT DETECTED NOT DETECTED Final   Astrovirus NOT DETECTED NOT DETECTED Final   Norovirus GI/GII DETECTED (A) NOT DETECTED Final    Comment: RESULT CALLED TO, READ BACK BY AND VERIFIED WITH: REBECCA FEWELL 05/22/22 1423 MW     Rotavirus A NOT DETECTED NOT DETECTED Final   Sapovirus (I, II, IV, and V) NOT DETECTED NOT DETECTED Final    Comment: Performed at [facility], [address] [place], [place] [address]  C Difficile Quick Screen w PCR reflex     Status: None   Collection Time: 05/23/23 12:09 AM   Specimen: Stool  Result Value Ref Range Status   C Diff antigen NEGATIVE NEGATIVE Final   C Diff toxin NEGATIVE NEGATIVE Final   C Diff interpretation No C. difficile detected.  Final    Comment: Performed at [facility], [address] [place], [place] [address]   Labs: CBC: Recent Labs  Lab 05/22/23 1639 05/23/23 0505 05/24/23 0541 05/25/23 0311 05/26/23 0311  WBC 7.4 9.6 5.8 5.5 4.7  NEUTROABS 6.0  --  3.5 3.4 2.7  HGB 14.0 12.9 10.9* 11.5* 10.7*  HCT 41.0 38.6 34.5* 35.4* 33.1*  MCV 91.7 94.6 99.7 97.5 97.4  PLT 292 278 215 233 188   Basic Metabolic Panel: Recent Labs  Lab 05/22/23 1639 05/23/23 0505 05/24/23 0541 05/25/23 0311 05/26/23 0311  NA 134* 137 136 137 138  K 3.3* 3.4* 4.0 3.5 3.4*  CL 100 103 103 104 105  CO2 22 22 25 25 24   GLUCOSE 130* 118* 102* 106* 96  BUN 17 18 14 12 11   CREATININE 0.96 1.13* 1.14* 1.06* 0.98  CALCIUM 10.0 9.6 9.1 8.9 8.5*  MG 1.9  --  1.6* 2.0 1.8  PHOS  --   --  2.3* 4.1 3.4   Liver Function Tests: Recent Labs  Lab 05/22/23 1639 05/23/23 0505 05/24/23 0541 05/25/23 0311 05/26/23 0311  AST 16 16 13* 13* 12*  ALT 12 12 12 11 11   ALKPHOS 82 71 60 56 54  BILITOT 1.6* 0.8 0.8 0.7 0.6  PROT 7.5 6.7 5.7* 5.7* 5.3*  ALBUMIN 4.6 3.7 3.3* 3.4* 3.2*   CBG: No results for input(s): "GLUCAP" in the last 168 hours.  Discharge time spent: greater than 30 minutes.  Signed: Marguerita Merles, DO [facility] 05/28/2023

## 2023-05-28 NOTE — Progress Notes (Addendum)
Radiation Oncology         [phone] ________________________________  Name: [name] MRN: [id number]  Date of Service: 05/28/2023  DOB: Jan 03, 1946  Post Treatment Telephone Note  Diagnosis:  C34.2 Malignant neoplasm of middle lobe, bronchus or lung (as documented in provider EOT note)  The patient was available for call today.   Symptoms of fatigue have improved since completing therapy.  Symptoms of skin changes have improved since completing therapy.  Symptoms of esophagitis have improved since completing therapy.   The patient has scheduled follow up with her medical oncologist Dr. Arbutus Ped for ongoing care, and was encouraged to call if she develops concerns or questions regarding radiation.   This concludes the interaction.  Ruel Favors, LPN

## 2023-05-30 DIAGNOSIS — E039 Hypothyroidism, unspecified: Secondary | ICD-10-CM | POA: Diagnosis not present

## 2023-05-30 DIAGNOSIS — H5461 Unqualified visual loss, right eye, normal vision left eye: Secondary | ICD-10-CM | POA: Diagnosis not present

## 2023-05-30 DIAGNOSIS — I69398 Other sequelae of cerebral infarction: Secondary | ICD-10-CM | POA: Diagnosis not present

## 2023-05-30 DIAGNOSIS — J449 Chronic obstructive pulmonary disease, unspecified: Secondary | ICD-10-CM | POA: Diagnosis not present

## 2023-05-30 DIAGNOSIS — N3001 Acute cystitis with hematuria: Secondary | ICD-10-CM | POA: Diagnosis not present

## 2023-05-30 DIAGNOSIS — I4819 Other persistent atrial fibrillation: Secondary | ICD-10-CM | POA: Diagnosis not present

## 2023-05-30 DIAGNOSIS — C342 Malignant neoplasm of middle lobe, bronchus or lung: Secondary | ICD-10-CM | POA: Diagnosis not present

## 2023-05-30 DIAGNOSIS — D630 Anemia in neoplastic disease: Secondary | ICD-10-CM | POA: Diagnosis not present

## 2023-05-30 DIAGNOSIS — I722 Aneurysm of renal artery: Secondary | ICD-10-CM | POA: Diagnosis not present

## 2023-05-31 NOTE — Telephone Encounter (Signed)
Spoke with pt and she is aware of appt. 

## 2023-06-04 DIAGNOSIS — J449 Chronic obstructive pulmonary disease, unspecified: Secondary | ICD-10-CM | POA: Diagnosis not present

## 2023-06-04 DIAGNOSIS — E039 Hypothyroidism, unspecified: Secondary | ICD-10-CM | POA: Diagnosis not present

## 2023-06-04 DIAGNOSIS — N3001 Acute cystitis with hematuria: Secondary | ICD-10-CM | POA: Diagnosis not present

## 2023-06-04 DIAGNOSIS — D630 Anemia in neoplastic disease: Secondary | ICD-10-CM | POA: Diagnosis not present

## 2023-06-04 DIAGNOSIS — C342 Malignant neoplasm of middle lobe, bronchus or lung: Secondary | ICD-10-CM | POA: Diagnosis not present

## 2023-06-04 DIAGNOSIS — I69398 Other sequelae of cerebral infarction: Secondary | ICD-10-CM | POA: Diagnosis not present

## 2023-06-04 DIAGNOSIS — I4819 Other persistent atrial fibrillation: Secondary | ICD-10-CM | POA: Diagnosis not present

## 2023-06-04 DIAGNOSIS — H5461 Unqualified visual loss, right eye, normal vision left eye: Secondary | ICD-10-CM | POA: Diagnosis not present

## 2023-06-04 DIAGNOSIS — I722 Aneurysm of renal artery: Secondary | ICD-10-CM | POA: Diagnosis not present

## 2023-06-07 DIAGNOSIS — E039 Hypothyroidism, unspecified: Secondary | ICD-10-CM | POA: Diagnosis not present

## 2023-06-07 DIAGNOSIS — H5461 Unqualified visual loss, right eye, normal vision left eye: Secondary | ICD-10-CM | POA: Diagnosis not present

## 2023-06-07 DIAGNOSIS — I69398 Other sequelae of cerebral infarction: Secondary | ICD-10-CM | POA: Diagnosis not present

## 2023-06-07 DIAGNOSIS — I722 Aneurysm of renal artery: Secondary | ICD-10-CM | POA: Diagnosis not present

## 2023-06-07 DIAGNOSIS — I4819 Other persistent atrial fibrillation: Secondary | ICD-10-CM | POA: Diagnosis not present

## 2023-06-07 DIAGNOSIS — D630 Anemia in neoplastic disease: Secondary | ICD-10-CM | POA: Diagnosis not present

## 2023-06-07 DIAGNOSIS — C342 Malignant neoplasm of middle lobe, bronchus or lung: Secondary | ICD-10-CM | POA: Diagnosis not present

## 2023-06-07 DIAGNOSIS — N3001 Acute cystitis with hematuria: Secondary | ICD-10-CM | POA: Diagnosis not present

## 2023-06-07 DIAGNOSIS — J449 Chronic obstructive pulmonary disease, unspecified: Secondary | ICD-10-CM | POA: Diagnosis not present

## 2023-06-11 DIAGNOSIS — D630 Anemia in neoplastic disease: Secondary | ICD-10-CM | POA: Diagnosis not present

## 2023-06-11 DIAGNOSIS — I722 Aneurysm of renal artery: Secondary | ICD-10-CM | POA: Diagnosis not present

## 2023-06-11 DIAGNOSIS — H5461 Unqualified visual loss, right eye, normal vision left eye: Secondary | ICD-10-CM | POA: Diagnosis not present

## 2023-06-11 DIAGNOSIS — N3021 Other chronic cystitis with hematuria: Secondary | ICD-10-CM | POA: Diagnosis not present

## 2023-06-11 DIAGNOSIS — N3090 Cystitis, unspecified without hematuria: Secondary | ICD-10-CM | POA: Diagnosis not present

## 2023-06-11 DIAGNOSIS — I4819 Other persistent atrial fibrillation: Secondary | ICD-10-CM | POA: Diagnosis not present

## 2023-06-11 DIAGNOSIS — K219 Gastro-esophageal reflux disease without esophagitis: Secondary | ICD-10-CM | POA: Diagnosis not present

## 2023-06-11 DIAGNOSIS — I69398 Other sequelae of cerebral infarction: Secondary | ICD-10-CM | POA: Diagnosis not present

## 2023-06-11 DIAGNOSIS — N3001 Acute cystitis with hematuria: Secondary | ICD-10-CM | POA: Diagnosis not present

## 2023-06-11 DIAGNOSIS — E785 Hyperlipidemia, unspecified: Secondary | ICD-10-CM | POA: Diagnosis not present

## 2023-06-11 DIAGNOSIS — A0811 Acute gastroenteropathy due to Norwalk agent: Secondary | ICD-10-CM | POA: Diagnosis not present

## 2023-06-11 DIAGNOSIS — E039 Hypothyroidism, unspecified: Secondary | ICD-10-CM | POA: Diagnosis not present

## 2023-06-11 DIAGNOSIS — I129 Hypertensive chronic kidney disease with stage 1 through stage 4 chronic kidney disease, or unspecified chronic kidney disease: Secondary | ICD-10-CM | POA: Diagnosis not present

## 2023-06-11 DIAGNOSIS — J449 Chronic obstructive pulmonary disease, unspecified: Secondary | ICD-10-CM | POA: Diagnosis not present

## 2023-06-11 DIAGNOSIS — R3914 Feeling of incomplete bladder emptying: Secondary | ICD-10-CM | POA: Diagnosis not present

## 2023-06-11 DIAGNOSIS — C342 Malignant neoplasm of middle lobe, bronchus or lung: Secondary | ICD-10-CM | POA: Diagnosis not present

## 2023-06-11 DIAGNOSIS — Z23 Encounter for immunization: Secondary | ICD-10-CM | POA: Diagnosis not present

## 2023-06-11 DIAGNOSIS — R3915 Urgency of urination: Secondary | ICD-10-CM | POA: Diagnosis not present

## 2023-06-11 DIAGNOSIS — R197 Diarrhea, unspecified: Secondary | ICD-10-CM | POA: Diagnosis not present

## 2023-06-13 DIAGNOSIS — I4819 Other persistent atrial fibrillation: Secondary | ICD-10-CM | POA: Diagnosis not present

## 2023-06-13 DIAGNOSIS — C342 Malignant neoplasm of middle lobe, bronchus or lung: Secondary | ICD-10-CM | POA: Diagnosis not present

## 2023-06-13 DIAGNOSIS — I69398 Other sequelae of cerebral infarction: Secondary | ICD-10-CM | POA: Diagnosis not present

## 2023-06-13 DIAGNOSIS — H5461 Unqualified visual loss, right eye, normal vision left eye: Secondary | ICD-10-CM | POA: Diagnosis not present

## 2023-06-13 DIAGNOSIS — E039 Hypothyroidism, unspecified: Secondary | ICD-10-CM | POA: Diagnosis not present

## 2023-06-13 DIAGNOSIS — D630 Anemia in neoplastic disease: Secondary | ICD-10-CM | POA: Diagnosis not present

## 2023-06-13 DIAGNOSIS — N3001 Acute cystitis with hematuria: Secondary | ICD-10-CM | POA: Diagnosis not present

## 2023-06-13 DIAGNOSIS — J449 Chronic obstructive pulmonary disease, unspecified: Secondary | ICD-10-CM | POA: Diagnosis not present

## 2023-06-13 DIAGNOSIS — I722 Aneurysm of renal artery: Secondary | ICD-10-CM | POA: Diagnosis not present

## 2023-06-14 DIAGNOSIS — R350 Frequency of micturition: Secondary | ICD-10-CM | POA: Diagnosis not present

## 2023-06-14 DIAGNOSIS — R3915 Urgency of urination: Secondary | ICD-10-CM | POA: Diagnosis not present

## 2023-06-18 DIAGNOSIS — E039 Hypothyroidism, unspecified: Secondary | ICD-10-CM | POA: Diagnosis not present

## 2023-06-18 DIAGNOSIS — C342 Malignant neoplasm of middle lobe, bronchus or lung: Secondary | ICD-10-CM | POA: Diagnosis not present

## 2023-06-18 DIAGNOSIS — H5461 Unqualified visual loss, right eye, normal vision left eye: Secondary | ICD-10-CM | POA: Diagnosis not present

## 2023-06-18 DIAGNOSIS — D630 Anemia in neoplastic disease: Secondary | ICD-10-CM | POA: Diagnosis not present

## 2023-06-18 DIAGNOSIS — I722 Aneurysm of renal artery: Secondary | ICD-10-CM | POA: Diagnosis not present

## 2023-06-18 DIAGNOSIS — I4819 Other persistent atrial fibrillation: Secondary | ICD-10-CM | POA: Diagnosis not present

## 2023-06-18 DIAGNOSIS — J449 Chronic obstructive pulmonary disease, unspecified: Secondary | ICD-10-CM | POA: Diagnosis not present

## 2023-06-18 DIAGNOSIS — N3001 Acute cystitis with hematuria: Secondary | ICD-10-CM | POA: Diagnosis not present

## 2023-06-18 DIAGNOSIS — I69398 Other sequelae of cerebral infarction: Secondary | ICD-10-CM | POA: Diagnosis not present

## 2023-06-19 DIAGNOSIS — J449 Chronic obstructive pulmonary disease, unspecified: Secondary | ICD-10-CM | POA: Diagnosis not present

## 2023-06-19 DIAGNOSIS — C342 Malignant neoplasm of middle lobe, bronchus or lung: Secondary | ICD-10-CM | POA: Diagnosis not present

## 2023-06-19 DIAGNOSIS — E039 Hypothyroidism, unspecified: Secondary | ICD-10-CM | POA: Diagnosis not present

## 2023-06-19 DIAGNOSIS — I4819 Other persistent atrial fibrillation: Secondary | ICD-10-CM | POA: Diagnosis not present

## 2023-06-19 DIAGNOSIS — N3001 Acute cystitis with hematuria: Secondary | ICD-10-CM | POA: Diagnosis not present

## 2023-06-19 DIAGNOSIS — D630 Anemia in neoplastic disease: Secondary | ICD-10-CM | POA: Diagnosis not present

## 2023-06-19 DIAGNOSIS — I69398 Other sequelae of cerebral infarction: Secondary | ICD-10-CM | POA: Diagnosis not present

## 2023-06-19 DIAGNOSIS — I722 Aneurysm of renal artery: Secondary | ICD-10-CM | POA: Diagnosis not present

## 2023-06-20 DIAGNOSIS — E039 Hypothyroidism, unspecified: Secondary | ICD-10-CM | POA: Diagnosis not present

## 2023-06-20 DIAGNOSIS — N3001 Acute cystitis with hematuria: Secondary | ICD-10-CM | POA: Diagnosis not present

## 2023-06-20 DIAGNOSIS — I4819 Other persistent atrial fibrillation: Secondary | ICD-10-CM | POA: Diagnosis not present

## 2023-06-20 DIAGNOSIS — J449 Chronic obstructive pulmonary disease, unspecified: Secondary | ICD-10-CM | POA: Diagnosis not present

## 2023-06-20 DIAGNOSIS — I69398 Other sequelae of cerebral infarction: Secondary | ICD-10-CM | POA: Diagnosis not present

## 2023-06-20 DIAGNOSIS — D630 Anemia in neoplastic disease: Secondary | ICD-10-CM | POA: Diagnosis not present

## 2023-06-20 DIAGNOSIS — I722 Aneurysm of renal artery: Secondary | ICD-10-CM | POA: Diagnosis not present

## 2023-06-20 DIAGNOSIS — H5461 Unqualified visual loss, right eye, normal vision left eye: Secondary | ICD-10-CM | POA: Diagnosis not present

## 2023-06-20 DIAGNOSIS — C342 Malignant neoplasm of middle lobe, bronchus or lung: Secondary | ICD-10-CM | POA: Diagnosis not present

## 2023-06-22 DIAGNOSIS — R350 Frequency of micturition: Secondary | ICD-10-CM | POA: Diagnosis not present

## 2023-06-22 DIAGNOSIS — R3915 Urgency of urination: Secondary | ICD-10-CM | POA: Diagnosis not present

## 2023-06-25 ENCOUNTER — Other Ambulatory Visit: Payer: Self-pay | Admitting: Medical Oncology

## 2023-06-25 DIAGNOSIS — C342 Malignant neoplasm of middle lobe, bronchus or lung: Secondary | ICD-10-CM

## 2023-06-26 ENCOUNTER — Ambulatory Visit (HOSPITAL_COMMUNITY)
Admission: RE | Admit: 2023-06-26 | Discharge: 2023-06-26 | Disposition: A | Discharge status: Home / Self Care | Payer: Medicare HMO | Source: Ambulatory Visit | Attending: Physician Assistant | Admitting: Physician Assistant

## 2023-06-26 ENCOUNTER — Inpatient Hospital Stay: Payer: Medicare HMO | Attending: Physician Assistant

## 2023-06-26 DIAGNOSIS — D630 Anemia in neoplastic disease: Secondary | ICD-10-CM | POA: Diagnosis not present

## 2023-06-26 DIAGNOSIS — C342 Malignant neoplasm of middle lobe, bronchus or lung: Secondary | ICD-10-CM | POA: Insufficient documentation

## 2023-06-26 DIAGNOSIS — I700 Atherosclerosis of aorta: Secondary | ICD-10-CM | POA: Diagnosis not present

## 2023-06-26 DIAGNOSIS — E7800 Pure hypercholesterolemia, unspecified: Secondary | ICD-10-CM | POA: Insufficient documentation

## 2023-06-26 DIAGNOSIS — G8929 Other chronic pain: Secondary | ICD-10-CM | POA: Diagnosis not present

## 2023-06-26 DIAGNOSIS — E039 Hypothyroidism, unspecified: Secondary | ICD-10-CM | POA: Diagnosis not present

## 2023-06-26 DIAGNOSIS — I722 Aneurysm of renal artery: Secondary | ICD-10-CM | POA: Diagnosis not present

## 2023-06-26 DIAGNOSIS — J449 Chronic obstructive pulmonary disease, unspecified: Secondary | ICD-10-CM | POA: Diagnosis not present

## 2023-06-26 DIAGNOSIS — I4819 Other persistent atrial fibrillation: Secondary | ICD-10-CM | POA: Insufficient documentation

## 2023-06-26 DIAGNOSIS — K219 Gastro-esophageal reflux disease without esophagitis: Secondary | ICD-10-CM | POA: Insufficient documentation

## 2023-06-26 DIAGNOSIS — K30 Functional dyspepsia: Secondary | ICD-10-CM | POA: Insufficient documentation

## 2023-06-26 DIAGNOSIS — Z79890 Hormone replacement therapy (postmenopausal): Secondary | ICD-10-CM | POA: Insufficient documentation

## 2023-06-26 DIAGNOSIS — H5461 Unqualified visual loss, right eye, normal vision left eye: Secondary | ICD-10-CM | POA: Diagnosis not present

## 2023-06-26 DIAGNOSIS — D490 Neoplasm of unspecified behavior of digestive system: Secondary | ICD-10-CM | POA: Insufficient documentation

## 2023-06-26 DIAGNOSIS — Z79899 Other long term (current) drug therapy: Secondary | ICD-10-CM | POA: Diagnosis not present

## 2023-06-26 DIAGNOSIS — N189 Chronic kidney disease, unspecified: Secondary | ICD-10-CM | POA: Insufficient documentation

## 2023-06-26 DIAGNOSIS — I129 Hypertensive chronic kidney disease with stage 1 through stage 4 chronic kidney disease, or unspecified chronic kidney disease: Secondary | ICD-10-CM | POA: Insufficient documentation

## 2023-06-26 DIAGNOSIS — J439 Emphysema, unspecified: Secondary | ICD-10-CM | POA: Diagnosis not present

## 2023-06-26 DIAGNOSIS — Z7901 Long term (current) use of anticoagulants: Secondary | ICD-10-CM | POA: Diagnosis not present

## 2023-06-26 DIAGNOSIS — M4312 Spondylolisthesis, cervical region: Secondary | ICD-10-CM | POA: Diagnosis not present

## 2023-06-26 DIAGNOSIS — I69398 Other sequelae of cerebral infarction: Secondary | ICD-10-CM | POA: Diagnosis not present

## 2023-06-26 DIAGNOSIS — C3490 Malignant neoplasm of unspecified part of unspecified bronchus or lung: Secondary | ICD-10-CM | POA: Diagnosis not present

## 2023-06-26 DIAGNOSIS — Z8673 Personal history of transient ischemic attack (TIA), and cerebral infarction without residual deficits: Secondary | ICD-10-CM | POA: Diagnosis not present

## 2023-06-26 DIAGNOSIS — I6529 Occlusion and stenosis of unspecified carotid artery: Secondary | ICD-10-CM | POA: Diagnosis not present

## 2023-06-26 DIAGNOSIS — I4891 Unspecified atrial fibrillation: Secondary | ICD-10-CM | POA: Insufficient documentation

## 2023-06-26 DIAGNOSIS — Z9049 Acquired absence of other specified parts of digestive tract: Secondary | ICD-10-CM | POA: Diagnosis not present

## 2023-06-26 DIAGNOSIS — R221 Localized swelling, mass and lump, neck: Secondary | ICD-10-CM | POA: Diagnosis not present

## 2023-06-26 DIAGNOSIS — N3001 Acute cystitis with hematuria: Secondary | ICD-10-CM | POA: Diagnosis not present

## 2023-06-26 LAB — CBC WITH DIFFERENTIAL (CANCER CENTER ONLY)
Abs Immature Granulocytes: 0.03 10*3/uL (ref 0.00–0.07)
Basophils Absolute: 0 10*3/uL (ref 0.0–0.1)
Basophils Relative: 0 %
Eosinophils Absolute: 0.2 10*3/uL (ref 0.0–0.5)
Eosinophils Relative: 4 %
HCT: 39.7 % (ref 36.0–46.0)
Hemoglobin: 12.7 g/dL (ref 12.0–15.0)
Immature Granulocytes: 1 %
Lymphocytes Relative: 25 %
Lymphs Abs: 1.2 10*3/uL (ref 0.7–4.0)
MCH: 30.6 pg (ref 26.0–34.0)
MCHC: 32 g/dL (ref 30.0–36.0)
MCV: 95.7 fL (ref 80.0–100.0)
Monocytes Absolute: 0.5 10*3/uL (ref 0.1–1.0)
Monocytes Relative: 11 %
Neutro Abs: 2.7 10*3/uL (ref 1.7–7.7)
Neutrophils Relative %: 59 %
Platelet Count: 236 10*3/uL (ref 150–400)
RBC: 4.15 MIL/uL (ref 3.87–5.11)
RDW: 12.2 % (ref 11.5–15.5)
WBC Count: 4.6 10*3/uL (ref 4.0–10.5)
nRBC: 0 % (ref 0.0–0.2)

## 2023-06-26 LAB — CMP (CANCER CENTER ONLY)
ALT: 7 U/L (ref 0–44)
AST: 11 U/L — ABNORMAL LOW (ref 15–41)
Albumin: 4.1 g/dL (ref 3.5–5.0)
Alkaline Phosphatase: 70 U/L (ref 38–126)
Anion gap: 4 — ABNORMAL LOW (ref 5–15)
BUN: 20 mg/dL (ref 8–23)
CO2: 30 mmol/L (ref 22–32)
Calcium: 10.2 mg/dL (ref 8.9–10.3)
Chloride: 106 mmol/L (ref 98–111)
Creatinine: 1.46 mg/dL — ABNORMAL HIGH (ref 0.44–1.00)
GFR, Estimated: 37 mL/min — ABNORMAL LOW (ref >60)
Glucose, Bld: 106 mg/dL — ABNORMAL HIGH (ref 70–99)
Potassium: 5.4 mmol/L — ABNORMAL HIGH (ref 3.5–5.1)
Sodium: 140 mmol/L (ref 135–145)
Total Bilirubin: 0.8 mg/dL (ref <1.2)
Total Protein: 6.8 g/dL (ref 6.5–8.1)

## 2023-06-26 MED ORDER — IOHEXOL 300 MG/ML  SOLN
80.0000 mL | Freq: Once | INTRAMUSCULAR | Status: AC | PRN
  Administered 2023-06-26: 60 mL via INTRAVENOUS

## 2023-06-27 DIAGNOSIS — D630 Anemia in neoplastic disease: Secondary | ICD-10-CM | POA: Diagnosis not present

## 2023-06-27 DIAGNOSIS — C342 Malignant neoplasm of middle lobe, bronchus or lung: Secondary | ICD-10-CM | POA: Diagnosis not present

## 2023-06-27 DIAGNOSIS — J449 Chronic obstructive pulmonary disease, unspecified: Secondary | ICD-10-CM | POA: Diagnosis not present

## 2023-06-27 DIAGNOSIS — N3001 Acute cystitis with hematuria: Secondary | ICD-10-CM | POA: Diagnosis not present

## 2023-06-27 DIAGNOSIS — I69398 Other sequelae of cerebral infarction: Secondary | ICD-10-CM | POA: Diagnosis not present

## 2023-06-27 DIAGNOSIS — I4819 Other persistent atrial fibrillation: Secondary | ICD-10-CM | POA: Diagnosis not present

## 2023-06-27 DIAGNOSIS — I722 Aneurysm of renal artery: Secondary | ICD-10-CM | POA: Diagnosis not present

## 2023-06-27 DIAGNOSIS — H5461 Unqualified visual loss, right eye, normal vision left eye: Secondary | ICD-10-CM | POA: Diagnosis not present

## 2023-06-27 DIAGNOSIS — E039 Hypothyroidism, unspecified: Secondary | ICD-10-CM | POA: Diagnosis not present

## 2023-06-29 DIAGNOSIS — R3915 Urgency of urination: Secondary | ICD-10-CM | POA: Diagnosis not present

## 2023-06-29 DIAGNOSIS — R350 Frequency of micturition: Secondary | ICD-10-CM | POA: Diagnosis not present

## 2023-07-02 ENCOUNTER — Inpatient Hospital Stay (HOSPITAL_BASED_OUTPATIENT_CLINIC_OR_DEPARTMENT_OTHER): Payer: Medicare HMO | Admitting: Internal Medicine

## 2023-07-02 VITALS — BP 102/72 | HR 97 | Temp 97.6°F | Resp 15 | Ht 65.0 in | Wt 139.3 lb

## 2023-07-02 DIAGNOSIS — C3490 Malignant neoplasm of unspecified part of unspecified bronchus or lung: Secondary | ICD-10-CM | POA: Diagnosis not present

## 2023-07-02 DIAGNOSIS — K30 Functional dyspepsia: Secondary | ICD-10-CM | POA: Diagnosis not present

## 2023-07-02 DIAGNOSIS — I129 Hypertensive chronic kidney disease with stage 1 through stage 4 chronic kidney disease, or unspecified chronic kidney disease: Secondary | ICD-10-CM | POA: Diagnosis not present

## 2023-07-02 DIAGNOSIS — G8929 Other chronic pain: Secondary | ICD-10-CM | POA: Diagnosis not present

## 2023-07-02 DIAGNOSIS — Z9049 Acquired absence of other specified parts of digestive tract: Secondary | ICD-10-CM | POA: Diagnosis not present

## 2023-07-02 DIAGNOSIS — N189 Chronic kidney disease, unspecified: Secondary | ICD-10-CM | POA: Diagnosis not present

## 2023-07-02 DIAGNOSIS — C342 Malignant neoplasm of middle lobe, bronchus or lung: Secondary | ICD-10-CM | POA: Diagnosis not present

## 2023-07-02 DIAGNOSIS — I700 Atherosclerosis of aorta: Secondary | ICD-10-CM | POA: Diagnosis not present

## 2023-07-02 DIAGNOSIS — I4891 Unspecified atrial fibrillation: Secondary | ICD-10-CM | POA: Diagnosis not present

## 2023-07-02 DIAGNOSIS — K219 Gastro-esophageal reflux disease without esophagitis: Secondary | ICD-10-CM | POA: Diagnosis not present
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
## 2023-07-03 ENCOUNTER — Ambulatory Visit (INDEPENDENT_AMBULATORY_CARE_PROVIDER_SITE_OTHER): Payer: Medicare HMO | Admitting: Gastroenterology

## 2023-07-03 ENCOUNTER — Encounter: Payer: Self-pay | Admitting: Gastroenterology

## 2023-07-03 VITALS — BP 90/70 | HR 91 | Ht 65.0 in | Wt 140.0 lb

## 2023-07-03 DIAGNOSIS — K838 Other specified diseases of biliary tract: Secondary | ICD-10-CM | POA: Insufficient documentation

## 2023-07-03 DIAGNOSIS — K219 Gastro-esophageal reflux disease without esophagitis: Secondary | ICD-10-CM | POA: Diagnosis not present
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
## 2023-07-03 NOTE — Progress Notes (Signed)
Addendum: Reviewed and agree with assessment and management plan. Kadijah Shamoon M, MD  
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
## 2023-07-03 NOTE — Patient Instructions (Signed)
Will call after talking with Dr. Meridee Score about scheduling your procedure.   _______________________________________________________  If your blood pressure at your visit was 140/90 or greater, please contact your primary care physician to follow up on this.  _______________________________________________________  If you are age 78 or older, your body mass index should be between 23-30. Your Body mass index is 23.3 kg/m. If this is out of the aforementioned range listed, please consider follow up with your Primary Care Provider.  If you are age 88 or younger, your body mass index should be between 19-25. Your Body mass index is 23.3 kg/m. If this is out of the aformentioned range listed, please consider follow up with your Primary Care Provider.   ________________________________________________________  The [facility] providers would like to encourage you to use [facility] to communicate with providers for non-urgent requests or questions.  Due to long hold times on the telephone, sending your provider a message by [facility] may be a faster and more efficient way to get a response.  Please allow 48 business hours for a response.  Please remember that this is for non-urgent requests.  _______________________________________________________

## 2023-07-04 ENCOUNTER — Telehealth: Payer: Self-pay

## 2023-07-04 ENCOUNTER — Other Ambulatory Visit: Payer: Self-pay

## 2023-07-04 DIAGNOSIS — H5461 Unqualified visual loss, right eye, normal vision left eye: Secondary | ICD-10-CM | POA: Diagnosis not present

## 2023-07-04 DIAGNOSIS — I4819 Other persistent atrial fibrillation: Secondary | ICD-10-CM | POA: Diagnosis not present

## 2023-07-04 DIAGNOSIS — N3001 Acute cystitis with hematuria: Secondary | ICD-10-CM | POA: Diagnosis not present

## 2023-07-04 DIAGNOSIS — K838 Other specified diseases of biliary tract: Secondary | ICD-10-CM

## 2023-07-04 DIAGNOSIS — I69398 Other sequelae of cerebral infarction: Secondary | ICD-10-CM | POA: Diagnosis not present

## 2023-07-04 DIAGNOSIS — J449 Chronic obstructive pulmonary disease, unspecified: Secondary | ICD-10-CM | POA: Diagnosis not present

## 2023-07-04 DIAGNOSIS — C342 Malignant neoplasm of middle lobe, bronchus or lung: Secondary | ICD-10-CM | POA: Diagnosis not present

## 2023-07-04 DIAGNOSIS — E039 Hypothyroidism, unspecified: Secondary | ICD-10-CM | POA: Diagnosis not present

## 2023-07-04 DIAGNOSIS — D630 Anemia in neoplastic disease: Secondary | ICD-10-CM | POA: Diagnosis not present

## 2023-07-04 DIAGNOSIS — I722 Aneurysm of renal artery: Secondary | ICD-10-CM | POA: Diagnosis not present

## 2023-07-04 NOTE — Telephone Encounter (Signed)
-----   Message from [facility] sent at 07/04/2023 12:43 PM EST ----- Alexia Freestone, Since we have that opening next week, can we see if she would be interested in this opening for EUS? GM ----- Message ----- From: Beverley Fiedler, MD Sent: 07/04/2023   8:04 AM EST To: Leta Baptist, PA-C; #  Seems ok to wait until next available in Jan or Feb GM, FYI, this is Dr. Sharee Pimple mother-in-law (not said to expedite, just FYI) JMP ----- Message ----- From: Lemar Lofty., MD Sent: 07/03/2023   5:14 PM EST To: Beverley Fiedler, MD; Leta Baptist, PA-C  Team, OK for EUS and can be available, but this is going to be January or February at this time, and with normal LFTs is not unreasonable.  Otherwise will need to send elsewhere. What would you all like to do? GM ----- Message ----- From: Leta Baptist, PA-C Sent: 07/03/2023   4:51 PM EST To: Beverley Fiedler, MD; Lemar Lofty., MD

## 2023-07-04 NOTE — Telephone Encounter (Signed)
[facility] Pre-operative Risk Assessment     Request for surgical clearance:     Endoscopy Procedure  What type of surgery is being performed?     EUS  When is this surgery scheduled?     07/12/23  What type of clearance is required ?   Pharmacy  Are there any medications that need to be held prior to surgery and how long? Eliquis  Practice name and name of physician performing surgery?      [facility]  What is your office phone and fax number?      Phone- [phone]  Fax- [phone]  Anesthesia type (None, local, MAC, general) ?       MAC   Please respond ASAP

## 2023-07-04 NOTE — Telephone Encounter (Signed)
EUS has been set up for 07/12/23 at 8 am at [facility] with GM   The pt has been advised of the EUS and agrees to 07/12/23.  She has been instructed and information sent to her in My Chart  Eliquis hold sent to cardiology to get ok.    Gerre Pebbles

## 2023-07-04 NOTE — Telephone Encounter (Signed)
Zehr, Princella Pellegrini, PA-C  Mansouraty, Netty Starring., MD; Rhea Belton Carie Caddy, MD; Loretha Stapler, RN Let me know if she schedules for next week.  They had asked me to call their daughter, which is Dr. Sharee Pimple wife so I will call her and update her on the plan, but wanted to wait and see in regards to the plan for EUS, etc.

## 2023-07-04 NOTE — Telephone Encounter (Signed)
Pharmacy please advise on holding Eliquis prior to EDG scheduled for 07/12/2023. Thank you.

## 2023-07-05 ENCOUNTER — Encounter (HOSPITAL_COMMUNITY): Payer: Self-pay | Admitting: Gastroenterology
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
## 2023-07-05 NOTE — Progress Notes (Signed)
Pre op call eval Name:[name] Avva MD Cardiologist-Jordan MD  Chest xray-ct 06/26/23 EKG-05/22/23 Echo-02/10/19 Cath-02/10/19 Stress-n/a ICD/PM- n/a Blood thinner- Eliquis- awaiting final clearance GLP-1- n/a  WF:UXNA, HTN, Stroke ,COPD, Nephrectomy, Aortic Atherosclerosis, Renal artery aneurysm, pancreatic divisum, adenocarcinoma of lung diagnosed 02/2023. Last appt with cardiology 01/09/23 is due to see them 11/18, not currently having any cardiac or breathing issues. Uses Rolator to get around. Anesthesia Review: Rosita Fire

## 2023-07-06 DIAGNOSIS — R350 Frequency of micturition: Secondary | ICD-10-CM | POA: Diagnosis not present

## 2023-07-06 DIAGNOSIS — R3915 Urgency of urination: Secondary | ICD-10-CM | POA: Diagnosis not present

## 2023-07-06 NOTE — Telephone Encounter (Signed)
   Patient Name: [name]  DOB: 1945/10/30 MRN: [id number]  Primary Cardiologist: Peter Swaziland, MD  Clinical pharmacists have reviewed the patient's past medical history, labs, and current medications as part of preoperative protocol coverage. The following recommendations have been made:  Patient with diagnosis of afib on Eliquis for anticoagulation.     Procedure: EUS Date of procedure: 07/12/23     CHA2DS2-VASc Score = 8   This indicates a 10.8% annual risk of stroke. The patient's score is based upon: CHF History: 1 HTN History: 1 Diabetes History: 0 Stroke History: 2 (occular CVA in 2012, didn't start on anticoag until 2018 for afib dx) Vascular Disease History: 1 Age Score: 2 Gender Score: 1       CrCl 31.8 ml/min  Platelet count 236   Per office protocol, patient can hold Eliquis for 2 days prior to procedure.  Please resume Eliquis as soon as possible postprocedure, at the discretion of the surgeon.   I will route this recommendation to the requesting party via Epic fax function and remove from pre-op pool.  Please call with questions.  Joylene Grapes, NP 07/06/2023, 8:37 AM

## 2023-07-06 NOTE — Telephone Encounter (Signed)
Patient with diagnosis of afib on Eliquis for anticoagulation.    Procedure: EUS Date of procedure: 07/12/23   CHA2DS2-VASc Score = 8   This indicates a 10.8% annual risk of stroke. The patient's score is based upon: CHF History: 1 HTN History: 1 Diabetes History: 0 Stroke History: 2 (occular CVA in 2012, didn't start on anticoag until 2018 for afib dx) Vascular Disease History: 1 Age Score: 2 Gender Score: 1      CrCl 31.8 ml/min  Platelet count 236  Per office protocol, patient can hold Eliquis for 2 days prior to procedure.    **This guidance is not considered finalized until pre-operative APP has relayed final recommendations.**

## 2023-07-09 ENCOUNTER — Ambulatory Visit: Payer: Medicare HMO | Attending: Cardiology | Admitting: Cardiology

## 2023-07-09 ENCOUNTER — Encounter: Payer: Self-pay | Admitting: Cardiology

## 2023-07-09 VITALS — BP 92/68 | HR 93 | Ht 66.0 in | Wt 141.0 lb

## 2023-07-09 DIAGNOSIS — I5181 Takotsubo syndrome: Secondary | ICD-10-CM | POA: Diagnosis not present

## 2023-07-09 DIAGNOSIS — I10 Essential (primary) hypertension: Secondary | ICD-10-CM | POA: Diagnosis not present

## 2023-07-09 DIAGNOSIS — I4821 Permanent atrial fibrillation: Secondary | ICD-10-CM | POA: Diagnosis not present

## 2023-07-09 MED ORDER — OMEPRAZOLE 20 MG PO CPDR: 20.0000 mg | DELAYED_RELEASE_CAPSULE | Freq: Two times a day (BID) | ORAL | 3 refills | Status: AC

## 2023-07-09 MED ORDER — APIXABAN 5 MG PO TABS: 5.0000 mg | ORAL_TABLET | Freq: Two times a day (BID) | ORAL | 0 refills | Status: DC

## 2023-07-09 NOTE — Addendum Note (Signed)
Addended by: Mariane Duval on: 07/09/2023 02:56 PM   Modules accepted: Orders

## 2023-07-09 NOTE — Progress Notes (Signed)
Patient informed she may restart Omeprazole 20 mg twice daily. Patient also informed she may hold Eliquis prior to procedure.

## 2023-07-09 NOTE — Patient Instructions (Signed)
 Medication Instructions:  Continue same medications *If you need a refill on your cardiac medications before your next appointment, please call your pharmacy*   Lab Work: None ordered   Testing/Procedures: None ordered   Follow-Up: At [facility], you and your health needs are our priority.  As part of our continuing mission to provide you with exceptional heart care, we have created designated Provider Care Teams.  These Care Teams include your primary Cardiologist (physician) and Advanced Practice Providers (APPs -  Physician Assistants and Nurse Practitioners) who all work together to provide you with the care you need, when you need it.  We recommend signing up for the patient portal called "MyChart".  Sign up information is provided on this After Visit Summary.  MyChart is used to connect with patients for Virtual Visits (Telemedicine).  Patients are able to view lab/test results, encounter notes, upcoming appointments, etc.  Non-urgent messages can be sent to your provider as well.   To learn more about what you can do with MyChart, go to [email or web address].    Your next appointment:  6 months   Call in Feb to schedule May appointment     Provider:  Dr.Jordan

## 2023-07-09 NOTE — Addendum Note (Signed)
Addended by: Neoma Laming on: 07/09/2023 03:01 PM   Modules accepted: Orders

## 2023-07-09 NOTE — Telephone Encounter (Signed)
Patient informed she may hold Eliquis 2 days prior to procedure.

## 2023-07-09 NOTE — Progress Notes (Signed)
Addendum: Reviewed and agree with assessment and management plan. Okay to resume PPI, most recent data does not support high risk of recurrent C. difficile infection with patients taking PPI; resuming particularly since she remains symptomatic on H2 blocker Larayne Baxley, Carie Caddy, MD

## 2023-07-11 DIAGNOSIS — J449 Chronic obstructive pulmonary disease, unspecified: Secondary | ICD-10-CM | POA: Diagnosis not present

## 2023-07-11 DIAGNOSIS — I722 Aneurysm of renal artery: Secondary | ICD-10-CM | POA: Diagnosis not present

## 2023-07-11 DIAGNOSIS — N3001 Acute cystitis with hematuria: Secondary | ICD-10-CM | POA: Diagnosis not present

## 2023-07-11 DIAGNOSIS — C342 Malignant neoplasm of middle lobe, bronchus or lung: Secondary | ICD-10-CM | POA: Diagnosis not present

## 2023-07-11 DIAGNOSIS — E039 Hypothyroidism, unspecified: Secondary | ICD-10-CM | POA: Diagnosis not present

## 2023-07-11 DIAGNOSIS — H5461 Unqualified visual loss, right eye, normal vision left eye: Secondary | ICD-10-CM | POA: Diagnosis not present

## 2023-07-11 DIAGNOSIS — I4819 Other persistent atrial fibrillation: Secondary | ICD-10-CM | POA: Diagnosis not present

## 2023-07-11 DIAGNOSIS — D630 Anemia in neoplastic disease: Secondary | ICD-10-CM | POA: Diagnosis not present

## 2023-07-11 DIAGNOSIS — I69398 Other sequelae of cerebral infarction: Secondary | ICD-10-CM | POA: Diagnosis not present

## 2023-07-11 NOTE — Anesthesia Preprocedure Evaluation (Signed)
Anesthesia Evaluation  Patient identified by MRN, date of birth, ID band Patient awake    Reviewed: Allergy & Precautions, H&P , NPO status , Patient's Chart, lab work & pertinent test results  Airway Mallampati: II  TM Distance: >3 FB Neck ROM: Full    Dental  (+) Edentulous Upper, Edentulous Lower, Dental Advisory Given, Upper Dentures   Pulmonary pneumonia, COPD, former smoker   breath sounds clear to auscultation + decreased breath sounds      Cardiovascular hypertension, Pt. on home beta blockers + dysrhythmias Atrial Fibrillation  Rhythm:Irregular Rate:Normal  Cath 02/10/2019 Assessment: 1. Essentially normal coronary arteries 2. Normal LV function EF 55% 3. Low filling pressures 4. 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 Filling pressures and cardiac output are low. Will stop lasix. With small venous wire perforation will hold heparin overnight and start Eliquis in am. Check CBC in am.   Echo 02/10/2019  1. The left ventricle has low normal systolic function, with an ejection fraction of 50-55%. The cavity size was normal. Left ventricular diastolic function could not be evaluated secondary to atrial fibrillation.   2. The right ventricle has normal systolc function. The cavity was normal.   3. The mitral valve is grossly normal. Mild thickening of the mitral valve leaflet.   4. The tricuspid valve was grossly normal.   5. The aortic valve is tricuspid Mild thickening of the aortic valve. No stenosis of the aortic valve.   6. Low normal LV systolic function; moderate biatrial enlargement.    Echo 02/02/2019  1. The left ventricle has a 2D calculated ejection fraction 20%. The cavity size was normal. There is mildly increased left ventricular wall thickness. Left  ventricular diastolic Doppler parameters are consistent with impaired relaxation.   2. Diffuse hypokinesis of left ventricle with mild basilar sparing. 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   3. The right ventricle has normal systolic function. The cavity was normal. There is no increase in right ventricular wall thickness. Right ventricular systolic pressure is moderately elevated.   4. Left atrial size was mildly dilated.   5. Right atrial size was mildly dilated.   6. Tricuspid valve regurgitation is severe.   7. The aortic valve is tricuspid. Mild thickening of the aortic valve. Mild calcification of the aortic valve. Aortic valve regurgitation is trivial by color flow Doppler.   8. The inferior vena cava was dilated in size with <50% respiratory variability.     Neuro/Psych  Headaches PSYCHIATRIC DISORDERS Anxiety Depression    CVA    GI/Hepatic Neg liver ROS, hiatal hernia,GERD  ,,  Endo/Other  Hypothyroidism    Renal/GU Renal disease     Musculoskeletal  (+) Arthritis ,    Abdominal   Peds  Hematology negative hematology ROS (+)   Anesthesia Other Findings   Reproductive/Obstetrics negative OB ROS                              Anesthesia Physical Anesthesia Plan  ASA: 3  Anesthesia Plan: MAC   Post-op Pain Management: Minimal or no pain anticipated   Induction: Intravenous  PONV Risk Score and Plan: 2 and Ondansetron, Treatment may vary due to age or medical condition, Propofol infusion and TIVA  Airway Management Planned: Natural Airway  Additional Equipment:   Intra-op Plan:  Post-operative Plan:   Informed Consent: I have reviewed the patients History and Physical, chart, labs and discussed the procedure including the risks, benefits and alternatives for the proposed anesthesia with the patient or authorized representative who has indicated his/her understanding and acceptance.     Dental advisory given  Plan  Discussed with: CRNA  Anesthesia Plan Comments: (PAT note by Katie Poles, [name]:  Patient is a 77 year old female with pertinent history including HTN, hypothyroid, GERD, hiatal hernia, former smoker with associated COPD, CVA 2012, CKD s/p right nephrectomy, persistent A-fib, history of Takotsubo cardiomyopathy with recovered EF.  She has a right middle lobe lung nodule-initially discovered on the chest x-ray.  Then confirmed with CT.  Mildly hypermetabolic on PET/CT.  She was seen by Dr. Dorris Fetch and recommended undergo navigational bronchoscopy.  She was advised to hold Eliquis 2 days prior to procedure.  She reports last dose of Eliquis 02/19/2023.  She was last seen by cardiology APP Carlos Levering, NP on 01/09/2023.  Noted to be stable at that time, no changes to management, 76-month follow-up recommended.  Preop labs reviewed, creatinine mildly elevated 1.36, otherwise WNL.  EKG 01/09/2023: Atrial fibrillation with PVCs or aberrantly conducted complexes.  Rate 75.  CT chest 01/02/2023: IMPRESSION: 1. Irregular solid 2.0 cm posterior right middle lobe pulmonary nodule, for which no previous chest CT is available for comparison at this time. Recommend PET-CT at this time for further characterization. 2. A few additional smaller scattered indistinct bilateral pulmonary nodules measuring up to 0.8 cm in the left upper lobe, generally below PET resolution, for which follow-up chest CT is advised. 3. No thoracic adenopathy. 4. Dilated main pulmonary artery, suggesting pulmonary arterial hypertension. 5. One vessel coronary atherosclerosis. 6. 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 7.  Aortic Atherosclerosis (ICD10-I70.0).  Right/left heart cath 02/10/2019: Assessment: 1. Essentially normal coronary arteries 2. Normal LV function EF 55% 3. Low filling pressures 4. 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 Filling pressures and cardiac output are low. Will stop lasix. With small venous wire perforation will hold heparin overnight and start Eliquis in am. Check CBC in am.  TTE 02/10/2019:  1. The left ventricle has low normal systolic function, with an ejection  fraction of 50-55%. The cavity size was normal. Left ventricular diastolic  function could not be evaluated secondary to atrial fibrillation.   2. The right ventricle has normal systolc function. The cavity was  normal.   3. The mitral valve is grossly normal. Mild thickening of the mitral  valve leaflet.   4. The tricuspid valve was grossly normal.   5. The aortic valve is tricuspid Mild thickening of the aortic valve. No  stenosis of the aortic valve.   6. Low normal LV systolic function; moderate biatrial enlargement.    )         Anesthesia Quick Evaluation

## 2023-07-12 ENCOUNTER — Encounter (HOSPITAL_COMMUNITY)
Admission: RE | Disposition: A | Discharge status: Home / Self Care | Payer: Self-pay | Source: Home / Self Care | Attending: Gastroenterology

## 2023-07-12 ENCOUNTER — Ambulatory Visit (HOSPITAL_BASED_OUTPATIENT_CLINIC_OR_DEPARTMENT_OTHER): Payer: Medicare HMO | Admitting: Anesthesiology

## 2023-07-12 ENCOUNTER — Encounter (HOSPITAL_COMMUNITY): Payer: Self-pay | Admitting: Gastroenterology

## 2023-07-12 ENCOUNTER — Ambulatory Visit (HOSPITAL_COMMUNITY): Payer: Self-pay | Admitting: Anesthesiology

## 2023-07-12 ENCOUNTER — Ambulatory Visit (HOSPITAL_COMMUNITY)
Admission: RE | Admit: 2023-07-12 | Discharge: 2023-07-12 | Disposition: A | Discharge status: Home / Self Care | Payer: Medicare HMO | Attending: Gastroenterology | Admitting: Gastroenterology

## 2023-07-12 ENCOUNTER — Other Ambulatory Visit: Payer: Self-pay

## 2023-07-12 DIAGNOSIS — K8689 Other specified diseases of pancreas: Secondary | ICD-10-CM | POA: Diagnosis not present

## 2023-07-12 DIAGNOSIS — K769 Liver disease, unspecified: Secondary | ICD-10-CM | POA: Diagnosis not present

## 2023-07-12 DIAGNOSIS — D721 Eosinophilia, unspecified: Secondary | ICD-10-CM | POA: Diagnosis not present

## 2023-07-12 DIAGNOSIS — K449 Diaphragmatic hernia without obstruction or gangrene: Secondary | ICD-10-CM

## 2023-07-12 DIAGNOSIS — I899 Noninfective disorder of lymphatic vessels and lymph nodes, unspecified: Secondary | ICD-10-CM | POA: Insufficient documentation

## 2023-07-12 DIAGNOSIS — I071 Rheumatic tricuspid insufficiency: Secondary | ICD-10-CM | POA: Insufficient documentation

## 2023-07-12 DIAGNOSIS — K219 Gastro-esophageal reflux disease without esophagitis: Secondary | ICD-10-CM | POA: Insufficient documentation

## 2023-07-12 DIAGNOSIS — Z87891 Personal history of nicotine dependence: Secondary | ICD-10-CM | POA: Insufficient documentation

## 2023-07-12 DIAGNOSIS — M1990 Unspecified osteoarthritis, unspecified site: Secondary | ICD-10-CM | POA: Diagnosis not present

## 2023-07-12 DIAGNOSIS — I129 Hypertensive chronic kidney disease with stage 1 through stage 4 chronic kidney disease, or unspecified chronic kidney disease: Secondary | ICD-10-CM | POA: Diagnosis not present

## 2023-07-12 DIAGNOSIS — R1310 Dysphagia, unspecified: Secondary | ICD-10-CM | POA: Diagnosis present

## 2023-07-12 DIAGNOSIS — Z79899 Other long term (current) drug therapy: Secondary | ICD-10-CM | POA: Diagnosis not present

## 2023-07-12 DIAGNOSIS — I4819 Other persistent atrial fibrillation: Secondary | ICD-10-CM | POA: Diagnosis not present

## 2023-07-12 DIAGNOSIS — Z8673 Personal history of transient ischemic attack (TIA), and cerebral infarction without residual deficits: Secondary | ICD-10-CM | POA: Diagnosis not present

## 2023-07-12 DIAGNOSIS — I10 Essential (primary) hypertension: Secondary | ICD-10-CM | POA: Diagnosis not present

## 2023-07-12 DIAGNOSIS — Z8711 Personal history of peptic ulcer disease: Secondary | ICD-10-CM | POA: Diagnosis not present

## 2023-07-12 DIAGNOSIS — K2970 Gastritis, unspecified, without bleeding: Secondary | ICD-10-CM

## 2023-07-12 DIAGNOSIS — Z923 Personal history of irradiation: Secondary | ICD-10-CM | POA: Insufficient documentation

## 2023-07-12 DIAGNOSIS — K838 Other specified diseases of biliary tract: Secondary | ICD-10-CM

## 2023-07-12 DIAGNOSIS — Z85118 Personal history of other malignant neoplasm of bronchus and lung: Secondary | ICD-10-CM | POA: Diagnosis not present

## 2023-07-12 DIAGNOSIS — N189 Chronic kidney disease, unspecified: Secondary | ICD-10-CM | POA: Diagnosis not present

## 2023-07-12 DIAGNOSIS — J449 Chronic obstructive pulmonary disease, unspecified: Secondary | ICD-10-CM | POA: Insufficient documentation

## 2023-07-12 DIAGNOSIS — K2210 Ulcer of esophagus without bleeding: Secondary | ICD-10-CM | POA: Diagnosis not present

## 2023-07-12 DIAGNOSIS — F418 Other specified anxiety disorders: Secondary | ICD-10-CM | POA: Insufficient documentation

## 2023-07-12 DIAGNOSIS — I4891 Unspecified atrial fibrillation: Secondary | ICD-10-CM | POA: Diagnosis not present

## 2023-07-12 DIAGNOSIS — E039 Hypothyroidism, unspecified: Secondary | ICD-10-CM | POA: Insufficient documentation

## 2023-07-12 DIAGNOSIS — I358 Other nonrheumatic aortic valve disorders: Secondary | ICD-10-CM | POA: Diagnosis not present

## 2023-07-12 DIAGNOSIS — K222 Esophageal obstruction: Secondary | ICD-10-CM | POA: Diagnosis not present

## 2023-07-12 DIAGNOSIS — K3189 Other diseases of stomach and duodenum: Secondary | ICD-10-CM | POA: Insufficient documentation

## 2023-07-12 HISTORY — PX: ESOPHAGOGASTRODUODENOSCOPY: SHX5428

## 2023-07-12 HISTORY — PX: BIOPSY: SHX5522

## 2023-07-12 HISTORY — PX: SAVORY DILATION: SHX5439

## 2023-07-12 HISTORY — PX: EUS: SHX5427

## 2023-07-12 LAB — POCT I-STAT, CHEM 8
BUN: 33 mg/dL — ABNORMAL HIGH (ref 8–23)
Calcium, Ion: 1.23 mmol/L (ref 1.15–1.40)
Chloride: 106 mmol/L (ref 98–111)
Creatinine, Ser: 1.9 mg/dL — ABNORMAL HIGH (ref 0.44–1.00)
Glucose, Bld: 102 mg/dL — ABNORMAL HIGH (ref 70–99)
HCT: 38 % (ref 36.0–46.0)
Hemoglobin: 12.9 g/dL (ref 12.0–15.0)
Potassium: 4.6 mmol/L (ref 3.5–5.1)
Sodium: 140 mmol/L (ref 135–145)
TCO2: 25 mmol/L (ref 22–32)

## 2023-07-12 SURGERY — UPPER ENDOSCOPIC ULTRASOUND (EUS) RADIAL
Anesthesia: Monitor Anesthesia Care

## 2023-07-12 MED ORDER — LIDOCAINE HCL (CARDIAC) PF 100 MG/5ML IV SOSY
PREFILLED_SYRINGE | INTRAVENOUS | Status: DC | PRN
  Administered 2023-07-12: 80 mg via INTRATRACHEAL

## 2023-07-12 MED ORDER — SODIUM CHLORIDE 0.9 % IV SOLN: INTRAVENOUS | Status: DC

## 2023-07-12 MED ORDER — LACTATED RINGERS IV SOLN: INTRAVENOUS | Status: DC | PRN

## 2023-07-12 MED ORDER — PROPOFOL 500 MG/50ML IV EMUL
INTRAVENOUS | Status: DC | PRN
  Administered 2023-07-12: 50 ug/kg/min via INTRAVENOUS
  Administered 2023-07-12: 30 mg via INTRAVENOUS
  Administered 2023-07-12: 20 mg via INTRAVENOUS
  Administered 2023-07-12: 30 mg via INTRAVENOUS

## 2023-07-12 MED ORDER — APIXABAN 5 MG PO TABS: 5.0000 mg | ORAL_TABLET | Freq: Two times a day (BID) | ORAL | Status: DC

## 2023-07-12 MED ORDER — SUCRALFATE 1 GM/10ML PO SUSP: 1.0000 g | Freq: Two times a day (BID) | ORAL | 1 refills | Status: DC

## 2023-07-12 MED ORDER — PHENYLEPHRINE HCL (PRESSORS) 10 MG/ML IV SOLN
INTRAVENOUS | Status: DC | PRN
  Administered 2023-07-12 (×2): 100 ug via INTRAVENOUS

## 2023-07-12 NOTE — Discharge Instructions (Signed)

## 2023-07-12 NOTE — Transfer of Care (Signed)
Immediate Anesthesia Transfer of Care Note  Patient: [name]  Procedure(s) Performed: UPPER ENDOSCOPIC ULTRASOUND (EUS) RADIAL ESOPHAGOGASTRODUODENOSCOPY (EGD) BIOPSY SAVORY DILATION  Patient Location: PACU  Anesthesia Type:MAC  Level of Consciousness: awake and alert   Airway & Oxygen Therapy: Patient Spontanous Breathing and Patient connected to face mask oxygen  Post-op Assessment: Report given to RN and Post -op Vital signs reviewed and stable  Post vital signs: Reviewed and stable  Last Vitals:  Vitals Value Taken Time  BP 104/64 07/12/23 0856  Temp    Pulse 80 07/12/23 0856  Resp 10 07/12/23 0859  SpO2 100 % 07/12/23 0856  Vitals shown include unfiled device data.  Last Pain:  Vitals:   07/12/23 0723  TempSrc: Tympanic  PainSc: 0-No pain         Complications: No notable events documented.

## 2023-07-12 NOTE — Addendum Note (Signed)
Addendum  created 07/12/23 1419 by Deri Fuelling, CRNA   Flowsheet accepted

## 2023-07-12 NOTE — Op Note (Signed)
[facility] Patient Name: [name] Procedure Date: 07/12/2023 MRN: [id number] Attending MD: Corliss Parish , MD, [id number] Date of Birth: [birth year] CSN: [id number] Age: 77 Admit Type: Outpatient Procedure:                Upper EUS Indications:              Common bile duct dilation (acquired) seen on MRCP,                            Dilated pancreatic duct on MRCP, Dysphagia Providers:                Corliss Parish, MD, Norman Clay, RN, Harrington Challenger,                            Technician Referring MD:             Carie Caddy. Rhea Belton, MD, Doug Sou PA, PA Medicines:                Monitored Anesthesia Care Complications:            No immediate complications. Estimated Blood Loss:     Estimated blood loss was minimal. Procedure:                Pre-Anesthesia Assessment:                           - Prior to the procedure, a History and Physical                            was performed, and patient medications and                            allergies were reviewed. The patient's tolerance of                            previous anesthesia was also reviewed. The risks                            and benefits of the procedure and the sedation                            options and risks were discussed with the patient.                            All questions were answered, and informed consent                            was obtained. Prior Anticoagulants: The patient has                            taken Eliquis (apixaban), last dose was 2 days                            prior to procedure. ASA Grade Assessment: III - A  patient with severe systemic disease. After                            reviewing the risks and benefits, the patient was                            deemed in satisfactory condition to undergo the                            procedure.                           After obtaining informed consent, the endoscope was                             passed under direct vision. Throughout the                            procedure, the patient's blood pressure, pulse, and                            oxygen saturations were monitored continuously. The                            [id number]-[id number] ([id number]) Olympus endoscope was introduced                            through the mouth, and advanced to the second part                            of duodenum. The [id number]-[id number] ([id number]) Olympus                            duodenoscope was introduced through the mouth, and                            advanced to the area of papilla. The [id number]                            ([id number]) Olympus linear ultrasound scope was                            introduced through the mouth, and advanced to the                            duodenum for ultrasound examination from the                            stomach and duodenum. The upper EUS was                            accomplished without difficulty. The patient  tolerated the procedure. Scope In: Scope Out: Findings:      ENDOSCOPIC FINDING: :      The examined esophagus was grossly tortuous.      No gross mucosal lesions were noted in the entire esophagus. After rest       of the EGD portion was completed, a guidewire was placed and the scope       was withdrawn. Dilation was performed with a Savary dilator with       moderate resistance at 16 mm. The dilation site was examined following       endoscope reinsertion and showed just below the UES a mild mucosal       disruption, mild improvement in luminal narrowing and no perforation.      A low-grade of narrowing Schatzki ring was found at the gastroesophageal       junction. Disruption biopsies were taken circumferentially with a cold       forceps for histology.      The Z-line was regular and was found 35 cm from the incisors.      A 4 cm hiatal hernia was present.      Striped mildly erythematous mucosa without bleeding was  found in the       gastric antrum.      No other gross lesions were noted in the entire examined stomach.       Biopsies were taken with a cold forceps for histology and Helicobacter       pylori testing.      No gross lesions were noted in the duodenal bulb, in the first portion       of the duodenum and in the second portion of the duodenum.      The major papilla was normal.      ENDOSONOGRAPHIC FINDING: :      Pancreatic parenchymal abnormalities were noted in the entire pancreas.       These consisted of hyperechoic strands.      The diameter of the main pancreatic duct (MPD) measured:      - HOP 3.7 -> 4.4 mm (head of pancreas)      - NOP 5.1 -> 4.9 mm (neck of pancreas)      - BOP 3.0 mm (body of the pancreas)      - TOP 2.5 -> 1.6 mm (tail of the pancreas)      - Dilation noted throughout the pancreas without a clear mass or lesion       or stricture.      There was dilation in the common bile duct (5.6 -> 7.5 -> 9.7 -> 16.3       mm) and in the common hepatic duct (12.7 mm).      Endosonographic imaging of the ampulla showed no intramural       (subepithelial) lesion.      A lesion was found in the visualized portion of the liver. The lesion       was homogenous and calcified. The lesion measured 36 mm by 32 mm in       maximal cross-sectional diameter.      No malignant-appearing lymph nodes were visualized in the celiac region       (level 20), peripancreatic region and porta hepatis region.      The celiac region was visualized. Impression:               EGD impression:                           -  Tortuous esophagus. No gross mucosal lesions in                            the entire esophagus. Dilated.                           - Low-grade of narrowing Schatzki ring. Biopsied.                           - Z-line regular, 35 cm from the incisors.                           - 4 cm hiatal hernia.                           - Erythematous mucosa in the antrum.                            - No gross mucosal lesions in the entire stomach.                            Biopsied.                           - No gross lesions in the duodenal bulb, in the                            first portion of the duodenum and in the second                            portion of the duodenum.                           - Normal major papilla.                           EUS Impression:                           - Pancreatic parenchymal abnormalities consisting                            of hyperechoic strands were noted in the entire                            pancreas.                           - Main pancreatic duct (MPD) diameter was measured                            as above. Endosonographically, the MPD had a                            dilated appearance.                           -  There was dilation in the common bile duct and in                            the common hepatic duct. No evidence of choledochol                           - A lesion was found in the visualized portion of                            the liver. The lesion was homogenous and calcified.                            The lesion measured 36 mm by 32 mm.                           - No malignant-appearing lymph nodes were                            visualized in the celiac region (level 20),                            peripancreatic region and porta hepatis region. Moderate Sedation:      Not Applicable - Patient had care per Anesthesia. Recommendation:           - The patient will be observed post-procedure,                            until all discharge criteria are met.                           - Discharge patient to home.                           - Patient has a contact number available for                            emergencies. The signs and symptoms of potential                            delayed complications were discussed with the                            patient. Return to normal activities  tomorrow.                            Written discharge instructions were provided to the                            patient.                           - Resume previous diet.                           -  Dilation diet as per protocol.                           - Please use Cepacol or Halls Lozenges +/-                            Chloraseptic spray for next 72-96 hours to aid in                            sore thoat should you experience this.                           - Observe patient's clinical course.                           - Await path results.                           - Continue current PPI twice daily                           - Carafate liquid slurry twice daily for 14 days.                           - Restart Eliquis on 11/23 to decrease risk of post                            interventional bleeding.                           - Continue present medications otherwise.                           - Recommend consideration of repeat MRI/MRCP in 1                            to 2 years (patient to have double duct sign even                            without overt mass or lesion are potentially at                            increased risk of development of malignancy in the                            future and so should be monitored. Certainly                            patient develops abnormal LFTs or pancreatitis or                            elevation in pancreatic enzymes, earlier repeat  imaging should be considered).                           - The findings and recommendations were discussed                            with the patient.                           - The findings and recommendations were discussed                            with the patient's family. Procedure Code(s):        --- Professional ---                           [phone], Esophagogastroduodenoscopy, flexible,                            transoral; with endoscopic ultrasound  examination                            limited to the esophagus, stomach or duodenum, and                            adjacent structures                           43248, Esophagogastroduodenoscopy, flexible,                            transoral; with insertion of guide wire followed by                            passage of dilator(s) through esophagus over guide                            wire                           43239, 59, Esophagogastroduodenoscopy, flexible,                            transoral; with biopsy, single or multiple Diagnosis Code(s):        --- Professional ---                           Q39.9, Congenital malformation of esophagus,                            unspecified                           K22.2, Esophageal obstruction                           K44.9, Diaphragmatic hernia without obstruction or  gangrene                           K31.89, Other diseases of stomach and duodenum                           K86.9, Disease of pancreas, unspecified                           K83.8, Other specified diseases of biliary tract                           K76.9, Liver disease, unspecified                           I89.9, Noninfective disorder of lymphatic vessels                            and lymph nodes, unspecified                           K86.89, Other specified diseases of pancreas                           R13.10, Dysphagia, unspecified                           R93.3, Abnormal findings on diagnostic imaging of                            other parts of digestive tract CPT copyright 2022 [employer]. All rights reserved. The codes documented in this report are preliminary and upon coder review may  be revised to meet current compliance requirements. Corliss Parish, MD 07/12/2023 9:16:09 AM Number of Addenda: 0

## 2023-07-12 NOTE — Interval H&P Note (Signed)
History and Physical Interval Note:  07/12/2023 7:54 AM  [name]  has presented today for surgery, with the diagnosis of bile duct dilation.  The various methods of treatment have been discussed with the patient and family. After consideration of risks, benefits and other options for treatment, the patient has consented to  Procedure(s): UPPER ENDOSCOPIC ULTRASOUND (EUS) RADIAL (N/A) as a surgical intervention.  The patient's history has been reviewed, patient examined, no change in status, stable for surgery.  I have reviewed the patient's chart and labs.  Questions were answered to the patient's satisfaction.     The risks of an EUS including intestinal perforation, bleeding, infection, aspiration, and medication effects were discussed as was the possibility it may not give a definitive diagnosis if a biopsy is performed.  When a biopsy of the pancreas is done as part of the EUS, there is an additional risk of pancreatitis at the rate of about 1-2%.  It was explained that procedure related pancreatitis is typically mild, although it can be severe and even life threatening, which is why we do not perform random pancreatic biopsies and only biopsy a lesion/area we feel is concerning enough to warrant the risk.    [employer]

## 2023-07-12 NOTE — Anesthesia Postprocedure Evaluation (Signed)
Anesthesia Post Note  Patient: [name]  Procedure(s) Performed: UPPER ENDOSCOPIC ULTRASOUND (EUS) RADIAL ESOPHAGOGASTRODUODENOSCOPY (EGD) BIOPSY SAVORY DILATION     Patient location during evaluation: PACU Anesthesia Type: MAC Level of consciousness: awake and alert Pain management: pain level controlled Vital Signs Assessment: post-procedure vital signs reviewed and stable Respiratory status: spontaneous breathing Cardiovascular status: stable Anesthetic complications: no   No notable events documented.  Last Vitals:  Vitals:   07/12/23 0856 07/12/23 0900  BP: 104/64 (!) 87/60  Pulse: 80   Resp: 10 11  Temp: (!) 36.2 C   SpO2: 100% 100%    Last Pain:  Vitals:   07/12/23 0900  TempSrc:   PainSc: 0-No pain                 Lewie Loron

## 2023-07-13 DIAGNOSIS — R350 Frequency of micturition: Secondary | ICD-10-CM | POA: Diagnosis not present

## 2023-07-13 LAB — SURGICAL PATHOLOGY

## 2023-07-15 ENCOUNTER — Encounter (HOSPITAL_COMMUNITY): Payer: Self-pay | Admitting: Gastroenterology

## 2023-07-17 ENCOUNTER — Encounter: Payer: Self-pay | Admitting: Gastroenterology

## 2023-07-18 DIAGNOSIS — M6281 Muscle weakness (generalized): Secondary | ICD-10-CM | POA: Diagnosis not present

## 2023-07-18 DIAGNOSIS — M6289 Other specified disorders of muscle: Secondary | ICD-10-CM | POA: Diagnosis not present

## 2023-07-18 DIAGNOSIS — R102 Pelvic and perineal pain: Secondary | ICD-10-CM | POA: Diagnosis not present

## 2023-07-18 DIAGNOSIS — N3946 Mixed incontinence: Secondary | ICD-10-CM | POA: Diagnosis not present

## 2023-07-23 DIAGNOSIS — I722 Aneurysm of renal artery: Secondary | ICD-10-CM | POA: Diagnosis not present

## 2023-07-23 DIAGNOSIS — N3001 Acute cystitis with hematuria: Secondary | ICD-10-CM | POA: Diagnosis not present

## 2023-07-23 DIAGNOSIS — I69398 Other sequelae of cerebral infarction: Secondary | ICD-10-CM | POA: Diagnosis not present

## 2023-07-23 DIAGNOSIS — H5461 Unqualified visual loss, right eye, normal vision left eye: Secondary | ICD-10-CM | POA: Diagnosis not present

## 2023-07-23 DIAGNOSIS — J449 Chronic obstructive pulmonary disease, unspecified: Secondary | ICD-10-CM | POA: Diagnosis not present

## 2023-07-23 DIAGNOSIS — D630 Anemia in neoplastic disease: Secondary | ICD-10-CM | POA: Diagnosis not present

## 2023-07-23 DIAGNOSIS — E039 Hypothyroidism, unspecified: Secondary | ICD-10-CM | POA: Diagnosis not present

## 2023-07-23 DIAGNOSIS — C342 Malignant neoplasm of middle lobe, bronchus or lung: Secondary | ICD-10-CM | POA: Diagnosis not present

## 2023-07-23 DIAGNOSIS — I4819 Other persistent atrial fibrillation: Secondary | ICD-10-CM | POA: Diagnosis not present

## 2023-08-03 DIAGNOSIS — R3915 Urgency of urination: Secondary | ICD-10-CM | POA: Diagnosis not present

## 2023-08-03 DIAGNOSIS — R350 Frequency of micturition: Secondary | ICD-10-CM | POA: Diagnosis not present

## 2023-08-10 DIAGNOSIS — R350 Frequency of micturition: Secondary | ICD-10-CM | POA: Diagnosis not present

## 2023-08-10 DIAGNOSIS — R3915 Urgency of urination: Secondary | ICD-10-CM | POA: Diagnosis not present

## 2023-08-20 DIAGNOSIS — I10 Essential (primary) hypertension: Secondary | ICD-10-CM | POA: Diagnosis not present

## 2023-08-20 DIAGNOSIS — E039 Hypothyroidism, unspecified: Secondary | ICD-10-CM | POA: Diagnosis not present

## 2023-08-20 DIAGNOSIS — E559 Vitamin D deficiency, unspecified: Secondary | ICD-10-CM | POA: Diagnosis not present

## 2023-08-20 DIAGNOSIS — Z0189 Encounter for other specified special examinations: Secondary | ICD-10-CM | POA: Diagnosis not present

## 2023-08-20 DIAGNOSIS — E785 Hyperlipidemia, unspecified: Secondary | ICD-10-CM | POA: Diagnosis not present

## 2023-08-20 DIAGNOSIS — Z1212 Encounter for screening for malignant neoplasm of rectum: Secondary | ICD-10-CM | POA: Diagnosis not present

## 2023-08-20 DIAGNOSIS — Z0000 Encounter for general adult medical examination without abnormal findings: Secondary | ICD-10-CM | POA: Diagnosis not present

## 2023-08-29 DIAGNOSIS — G894 Chronic pain syndrome: Secondary | ICD-10-CM | POA: Diagnosis not present

## 2023-08-29 DIAGNOSIS — F1120 Opioid dependence, uncomplicated: Secondary | ICD-10-CM | POA: Diagnosis not present

## 2023-08-29 DIAGNOSIS — C3490 Malignant neoplasm of unspecified part of unspecified bronchus or lung: Secondary | ICD-10-CM | POA: Diagnosis not present

## 2023-08-29 DIAGNOSIS — Z0000 Encounter for general adult medical examination without abnormal findings: Secondary | ICD-10-CM | POA: Diagnosis not present

## 2023-08-29 DIAGNOSIS — Z1339 Encounter for screening examination for other mental health and behavioral disorders: Secondary | ICD-10-CM | POA: Diagnosis not present

## 2023-08-29 DIAGNOSIS — Z905 Acquired absence of kidney: Secondary | ICD-10-CM | POA: Diagnosis not present

## 2023-08-29 DIAGNOSIS — F331 Major depressive disorder, recurrent, moderate: Secondary | ICD-10-CM | POA: Diagnosis not present

## 2023-08-29 DIAGNOSIS — I4891 Unspecified atrial fibrillation: Secondary | ICD-10-CM | POA: Diagnosis not present

## 2023-08-29 DIAGNOSIS — Z1331 Encounter for screening for depression: Secondary | ICD-10-CM | POA: Diagnosis not present

## 2023-08-29 DIAGNOSIS — N1831 Chronic kidney disease, stage 3a: Secondary | ICD-10-CM | POA: Diagnosis not present

## 2023-08-29 DIAGNOSIS — I129 Hypertensive chronic kidney disease with stage 1 through stage 4 chronic kidney disease, or unspecified chronic kidney disease: Secondary | ICD-10-CM | POA: Diagnosis not present

## 2023-09-12 ENCOUNTER — Telehealth: Payer: Self-pay | Admitting: Cardiology

## 2023-09-12 NOTE — Telephone Encounter (Signed)
Patients daughter dropped off assistance form. In providers box.

## 2023-09-14 ENCOUNTER — Other Ambulatory Visit: Payer: Self-pay

## 2023-09-14 DIAGNOSIS — R3915 Urgency of urination: Secondary | ICD-10-CM | POA: Diagnosis not present

## 2023-09-14 DIAGNOSIS — R350 Frequency of micturition: Secondary | ICD-10-CM | POA: Diagnosis not present

## 2023-09-14 MED ORDER — APIXABAN 5 MG PO TABS: 5.0000 mg | ORAL_TABLET | Freq: Two times a day (BID) | ORAL | 3 refills | Status: DC

## 2023-09-14 NOTE — Telephone Encounter (Signed)
Patient assistance paperwork for Eliquis completed and faxed to fax # [phone].

## 2023-09-14 NOTE — Telephone Encounter (Signed)
Patient assistance paperwork for Eliquis completed and faxed to Alver Fisher at fax # [phone].

## 2023-09-17 ENCOUNTER — Encounter: Payer: Self-pay | Admitting: Gastroenterology

## 2023-11-29 ENCOUNTER — Other Ambulatory Visit

## 2023-11-29 ENCOUNTER — Other Ambulatory Visit: Payer: Self-pay | Admitting: *Deleted

## 2023-11-29 DIAGNOSIS — R197 Diarrhea, unspecified: Secondary | ICD-10-CM

## 2023-11-29 DIAGNOSIS — Z8619 Personal history of other infectious and parasitic diseases: Secondary | ICD-10-CM

## 2023-12-04 ENCOUNTER — Telehealth: Payer: Self-pay | Admitting: Gastroenterology

## 2023-12-04 NOTE — Telephone Encounter (Signed)
 Inbound call from patient's daughter, states she would like medication called in for her mother because she believes she had C diff. She states her father was also diagnosed with C diff and patient is not able to take stool sample due to not being able to get out of bed. She states she is a Engineer, civil (consulting) and believes it is urgent and would like someone to call before the end of the day.

## 2023-12-05 NOTE — Telephone Encounter (Signed)
 The pt husband was seen by [facility] May with C diff. She asked to be tested at that visit.  Deanna ordered.

## 2023-12-05 NOTE — Telephone Encounter (Signed)
 I spoke with the pt and advised that an order for C diff has been entered per [facility] May.  She states she has collected this morning and will have someone return as soon as able.  She will await the results and treatment if needed.

## 2023-12-06 ENCOUNTER — Other Ambulatory Visit

## 2023-12-06 DIAGNOSIS — R197 Diarrhea, unspecified: Secondary | ICD-10-CM

## 2023-12-06 DIAGNOSIS — Z8619 Personal history of other infectious and parasitic diseases: Secondary | ICD-10-CM | POA: Diagnosis not present

## 2023-12-08 LAB — CLOSTRIDIUM DIFFICILE BY PCR: Toxigenic C. Difficile by PCR: POSITIVE — AB

## 2023-12-10 ENCOUNTER — Other Ambulatory Visit: Payer: Self-pay | Admitting: Gastroenterology

## 2023-12-10 DIAGNOSIS — A498 Other bacterial infections of unspecified site: Secondary | ICD-10-CM

## 2023-12-10 MED ORDER — SACCHAROMYCES BOULARDII 250 MG PO CAPS: 250.0000 mg | ORAL_CAPSULE | Freq: Two times a day (BID) | ORAL | 2 refills | Status: DC

## 2023-12-10 MED ORDER — FIDAXOMICIN 200 MG PO TABS: 200.0000 mg | ORAL_TABLET | Freq: Two times a day (BID) | ORAL | 0 refills | Status: DC

## 2023-12-10 NOTE — Progress Notes (Signed)
 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  Send RX for dificid  200mg  BID for 10 days and florastor 1 capsule twice daily. Will have patient fup with Dr. Bridgett Camps or APP in 2-3 weeks.

## 2023-12-11 ENCOUNTER — Other Ambulatory Visit: Payer: Self-pay | Admitting: Gastroenterology

## 2023-12-11 DIAGNOSIS — A498 Other bacterial infections of unspecified site: Secondary | ICD-10-CM

## 2023-12-11 MED ORDER — VANCOMYCIN HCL 125 MG PO CAPS: 125.0000 mg | ORAL_CAPSULE | Freq: Four times a day (QID) | ORAL | 0 refills | Status: AC

## 2023-12-11 NOTE — Telephone Encounter (Signed)
 [name] this is the pt you spoke with yesterday.  Let me know if you need me.

## 2023-12-11 NOTE — Telephone Encounter (Signed)
 Inbound call from patient, states Dificid  is $1,000 and she is unable to pay it. Would like to speak with a nurse to discuss more affordable options.

## 2023-12-11 NOTE — Telephone Encounter (Signed)
 [name], please advise.... Dificid  is not an affordable option for patient.

## 2023-12-11 NOTE — Progress Notes (Signed)
 Dificid  not covered by patients insurance. Will send vancomycin  125mg  QID for 10 days.

## 2023-12-12 NOTE — Telephone Encounter (Signed)
 Left message on home number that [name] has sent an alternated prescription to the pharmacy since previous medication was not cost effective. Attempted to reach patient on cell number as well, but no answer x 2 and voicemail is full.

## 2023-12-21 ENCOUNTER — Inpatient Hospital Stay: Payer: Medicare HMO | Attending: Internal Medicine

## 2023-12-21 ENCOUNTER — Ambulatory Visit (HOSPITAL_COMMUNITY)
Admission: RE | Admit: 2023-12-21 | Discharge: 2023-12-21 | Disposition: A | Discharge status: Home / Self Care | Payer: Medicare HMO | Source: Ambulatory Visit | Attending: Internal Medicine | Admitting: Internal Medicine

## 2023-12-21 DIAGNOSIS — K449 Diaphragmatic hernia without obstruction or gangrene: Secondary | ICD-10-CM | POA: Diagnosis not present

## 2023-12-21 DIAGNOSIS — C3490 Malignant neoplasm of unspecified part of unspecified bronchus or lung: Secondary | ICD-10-CM | POA: Diagnosis not present

## 2023-12-21 DIAGNOSIS — C342 Malignant neoplasm of middle lobe, bronchus or lung: Secondary | ICD-10-CM | POA: Diagnosis not present

## 2023-12-21 LAB — CMP (CANCER CENTER ONLY)
ALT: 7 U/L (ref 0–44)
AST: 11 U/L — ABNORMAL LOW (ref 15–41)
Albumin: 4 g/dL (ref 3.5–5.0)
Alkaline Phosphatase: 77 U/L (ref 38–126)
Anion gap: 6 (ref 5–15)
BUN: 17 mg/dL (ref 8–23)
CO2: 27 mmol/L (ref 22–32)
Calcium: 9.5 mg/dL (ref 8.9–10.3)
Chloride: 107 mmol/L (ref 98–111)
Creatinine: 1.26 mg/dL — ABNORMAL HIGH (ref 0.44–1.00)
GFR, Estimated: 44 mL/min — ABNORMAL LOW (ref >60)
Glucose, Bld: 92 mg/dL (ref 70–99)
Potassium: 4.3 mmol/L (ref 3.5–5.1)
Sodium: 140 mmol/L (ref 135–145)
Total Bilirubin: 0.5 mg/dL (ref 0.0–1.2)
Total Protein: 6.6 g/dL (ref 6.5–8.1)

## 2023-12-21 LAB — CBC WITH DIFFERENTIAL (CANCER CENTER ONLY)
Abs Immature Granulocytes: 0.01 10*3/uL (ref 0.00–0.07)
Basophils Absolute: 0 10*3/uL (ref 0.0–0.1)
Basophils Relative: 0 %
Eosinophils Absolute: 0.1 10*3/uL (ref 0.0–0.5)
Eosinophils Relative: 1 %
HCT: 34.3 % — ABNORMAL LOW (ref 36.0–46.0)
Hemoglobin: 11 g/dL — ABNORMAL LOW (ref 12.0–15.0)
Immature Granulocytes: 0 %
Lymphocytes Relative: 25 %
Lymphs Abs: 1.2 10*3/uL (ref 0.7–4.0)
MCH: 28.1 pg (ref 26.0–34.0)
MCHC: 32.1 g/dL (ref 30.0–36.0)
MCV: 87.7 fL (ref 80.0–100.0)
Monocytes Absolute: 0.5 10*3/uL (ref 0.1–1.0)
Monocytes Relative: 9 %
Neutro Abs: 3.1 10*3/uL (ref 1.7–7.7)
Neutrophils Relative %: 65 %
Platelet Count: 289 10*3/uL (ref 150–400)
RBC: 3.91 MIL/uL (ref 3.87–5.11)
RDW: 14.1 % (ref 11.5–15.5)
WBC Count: 4.9 10*3/uL (ref 4.0–10.5)
nRBC: 0 % (ref 0.0–0.2)

## 2023-12-27 ENCOUNTER — Inpatient Hospital Stay: Payer: Medicare HMO | Admitting: Internal Medicine

## 2023-12-27 ENCOUNTER — Telehealth: Payer: Self-pay | Admitting: Internal Medicine

## 2023-12-27 ENCOUNTER — Telehealth: Payer: Self-pay | Admitting: Medical Oncology

## 2023-12-27 NOTE — Telephone Encounter (Signed)
 Pt LVM to cancel her f/u today because she is sick. CT scan is not read. Appt r/s .

## 2023-12-27 NOTE — Telephone Encounter (Signed)
 Rescheduled appointment per the patient being sick. Left the patient a voicemail with the rescheduled details on the home phone.

## 2023-12-31 ENCOUNTER — Ambulatory Visit: Admitting: Physician Assistant

## 2024-01-01 DIAGNOSIS — N390 Urinary tract infection, site not specified: Secondary | ICD-10-CM | POA: Diagnosis not present

## 2024-01-01 DIAGNOSIS — R399 Unspecified symptoms and signs involving the genitourinary system: Secondary | ICD-10-CM | POA: Diagnosis not present
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
## 2024-01-02 ENCOUNTER — Inpatient Hospital Stay (HOSPITAL_COMMUNITY)
Admission: EM | Admit: 2024-01-02 | Discharge: 2024-01-08 | DRG: 872 | Disposition: A | Discharge status: Home Health | Attending: Internal Medicine | Admitting: Internal Medicine

## 2024-01-02 ENCOUNTER — Emergency Department (HOSPITAL_COMMUNITY)

## 2024-01-02 ENCOUNTER — Ambulatory Visit: Admitting: Nurse Practitioner

## 2024-01-02 ENCOUNTER — Other Ambulatory Visit: Payer: Self-pay

## 2024-01-02 ENCOUNTER — Encounter (HOSPITAL_COMMUNITY): Payer: Self-pay

## 2024-01-02 DIAGNOSIS — Z87891 Personal history of nicotine dependence: Secondary | ICD-10-CM

## 2024-01-02 DIAGNOSIS — E860 Dehydration: Secondary | ICD-10-CM | POA: Diagnosis present

## 2024-01-02 DIAGNOSIS — B961 Klebsiella pneumoniae [K. pneumoniae] as the cause of diseases classified elsewhere: Secondary | ICD-10-CM | POA: Diagnosis present

## 2024-01-02 DIAGNOSIS — Z8673 Personal history of transient ischemic attack (TIA), and cerebral infarction without residual deficits: Secondary | ICD-10-CM

## 2024-01-02 DIAGNOSIS — R1084 Generalized abdominal pain: Secondary | ICD-10-CM | POA: Diagnosis not present

## 2024-01-02 DIAGNOSIS — N136 Pyonephrosis: Secondary | ICD-10-CM | POA: Diagnosis present

## 2024-01-02 DIAGNOSIS — N179 Acute kidney failure, unspecified: Secondary | ICD-10-CM | POA: Diagnosis present

## 2024-01-02 DIAGNOSIS — Z96641 Presence of right artificial hip joint: Secondary | ICD-10-CM | POA: Diagnosis present

## 2024-01-02 DIAGNOSIS — E7800 Pure hypercholesterolemia, unspecified: Secondary | ICD-10-CM | POA: Diagnosis present

## 2024-01-02 DIAGNOSIS — N12 Tubulo-interstitial nephritis, not specified as acute or chronic: Secondary | ICD-10-CM | POA: Diagnosis present

## 2024-01-02 DIAGNOSIS — E876 Hypokalemia: Secondary | ICD-10-CM | POA: Diagnosis present

## 2024-01-02 DIAGNOSIS — I4819 Other persistent atrial fibrillation: Secondary | ICD-10-CM

## 2024-01-02 DIAGNOSIS — K7689 Other specified diseases of liver: Secondary | ICD-10-CM | POA: Diagnosis not present

## 2024-01-02 DIAGNOSIS — Z880 Allergy status to penicillin: Secondary | ICD-10-CM

## 2024-01-02 DIAGNOSIS — Z7901 Long term (current) use of anticoagulants: Secondary | ICD-10-CM

## 2024-01-02 DIAGNOSIS — J449 Chronic obstructive pulmonary disease, unspecified: Secondary | ICD-10-CM | POA: Diagnosis present

## 2024-01-02 DIAGNOSIS — K769 Liver disease, unspecified: Secondary | ICD-10-CM | POA: Diagnosis not present

## 2024-01-02 DIAGNOSIS — I129 Hypertensive chronic kidney disease with stage 1 through stage 4 chronic kidney disease, or unspecified chronic kidney disease: Secondary | ICD-10-CM | POA: Diagnosis present

## 2024-01-02 DIAGNOSIS — Z905 Acquired absence of kidney: Secondary | ICD-10-CM

## 2024-01-02 DIAGNOSIS — I517 Cardiomegaly: Secondary | ICD-10-CM | POA: Diagnosis not present

## 2024-01-02 DIAGNOSIS — A419 Sepsis, unspecified organism: Secondary | ICD-10-CM | POA: Diagnosis present

## 2024-01-02 DIAGNOSIS — Z8619 Personal history of other infectious and parasitic diseases: Secondary | ICD-10-CM

## 2024-01-02 DIAGNOSIS — Z8249 Family history of ischemic heart disease and other diseases of the circulatory system: Secondary | ICD-10-CM

## 2024-01-02 DIAGNOSIS — B370 Candidal stomatitis: Secondary | ICD-10-CM | POA: Diagnosis present

## 2024-01-02 DIAGNOSIS — E039 Hypothyroidism, unspecified: Secondary | ICD-10-CM

## 2024-01-02 DIAGNOSIS — N10 Acute tubulo-interstitial nephritis: Secondary | ICD-10-CM | POA: Diagnosis not present

## 2024-01-02 DIAGNOSIS — N132 Hydronephrosis with renal and ureteral calculous obstruction: Secondary | ICD-10-CM | POA: Diagnosis not present

## 2024-01-02 DIAGNOSIS — Z79899 Other long term (current) drug therapy: Secondary | ICD-10-CM

## 2024-01-02 DIAGNOSIS — K449 Diaphragmatic hernia without obstruction or gangrene: Secondary | ICD-10-CM | POA: Diagnosis not present

## 2024-01-02 DIAGNOSIS — Z923 Personal history of irradiation: Secondary | ICD-10-CM

## 2024-01-02 DIAGNOSIS — Z79890 Hormone replacement therapy (postmenopausal): Secondary | ICD-10-CM

## 2024-01-02 DIAGNOSIS — F32A Depression, unspecified: Secondary | ICD-10-CM | POA: Diagnosis present

## 2024-01-02 DIAGNOSIS — K219 Gastro-esophageal reflux disease without esophagitis: Secondary | ICD-10-CM | POA: Diagnosis present

## 2024-01-02 DIAGNOSIS — Z823 Family history of stroke: Secondary | ICD-10-CM

## 2024-01-02 DIAGNOSIS — Z1152 Encounter for screening for COVID-19: Secondary | ICD-10-CM | POA: Diagnosis not present

## 2024-01-02 DIAGNOSIS — N1831 Chronic kidney disease, stage 3a: Secondary | ICD-10-CM

## 2024-01-02 DIAGNOSIS — D509 Iron deficiency anemia, unspecified: Secondary | ICD-10-CM | POA: Diagnosis present

## 2024-01-02 DIAGNOSIS — R197 Diarrhea, unspecified: Secondary | ICD-10-CM | POA: Diagnosis not present

## 2024-01-02 DIAGNOSIS — R000 Tachycardia, unspecified: Secondary | ICD-10-CM | POA: Diagnosis not present

## 2024-01-02 DIAGNOSIS — A0472 Enterocolitis due to Clostridium difficile, not specified as recurrent: Secondary | ICD-10-CM | POA: Diagnosis present

## 2024-01-02 DIAGNOSIS — C342 Malignant neoplasm of middle lobe, bronchus or lung: Secondary | ICD-10-CM | POA: Diagnosis present

## 2024-01-02 DIAGNOSIS — B964 Proteus (mirabilis) (morganii) as the cause of diseases classified elsewhere: Secondary | ICD-10-CM | POA: Diagnosis present

## 2024-01-02 DIAGNOSIS — C3490 Malignant neoplasm of unspecified part of unspecified bronchus or lung: Secondary | ICD-10-CM | POA: Diagnosis present

## 2024-01-02 DIAGNOSIS — Z90710 Acquired absence of both cervix and uterus: Secondary | ICD-10-CM

## 2024-01-02 DIAGNOSIS — Z7409 Other reduced mobility: Secondary | ICD-10-CM | POA: Diagnosis present

## 2024-01-02 DIAGNOSIS — R918 Other nonspecific abnormal finding of lung field: Secondary | ICD-10-CM | POA: Diagnosis not present

## 2024-01-02 DIAGNOSIS — R112 Nausea with vomiting, unspecified: Secondary | ICD-10-CM | POA: Diagnosis not present

## 2024-01-02 LAB — COMPREHENSIVE METABOLIC PANEL WITH GFR
ALT: 17 U/L (ref 0–44)
AST: 21 U/L (ref 15–41)
Albumin: 3.3 g/dL — ABNORMAL LOW (ref 3.5–5.0)
Alkaline Phosphatase: 86 U/L (ref 38–126)
Anion gap: 11 (ref 5–15)
BUN: 25 mg/dL — ABNORMAL HIGH (ref 8–23)
CO2: 24 mmol/L (ref 22–32)
Calcium: 10.3 mg/dL (ref 8.9–10.3)
Chloride: 101 mmol/L (ref 98–111)
Creatinine, Ser: 1.64 mg/dL — ABNORMAL HIGH (ref 0.44–1.00)
GFR, Estimated: 32 mL/min — ABNORMAL LOW (ref >60)
Glucose, Bld: 98 mg/dL (ref 70–99)
Potassium: 3.9 mmol/L (ref 3.5–5.1)
Sodium: 136 mmol/L (ref 135–145)
Total Bilirubin: 1.1 mg/dL (ref 0.0–1.2)
Total Protein: 7.3 g/dL (ref 6.5–8.1)

## 2024-01-02 LAB — URINALYSIS, ROUTINE W REFLEX MICROSCOPIC
Bilirubin Urine: NEGATIVE
Glucose, UA: NEGATIVE mg/dL
Ketones, ur: 5 mg/dL — AB
Nitrite: POSITIVE — AB
Protein, ur: 100 mg/dL — AB
Specific Gravity, Urine: 1.026 (ref 1.005–1.030)
WBC, UA: >50 WBC/hpf (ref 0–5)
pH: 8 (ref 5.0–8.0)

## 2024-01-02 LAB — CBC WITH DIFFERENTIAL/PLATELET
Abs Immature Granulocytes: 0.09 10*3/uL — ABNORMAL HIGH (ref 0.00–0.07)
Basophils Absolute: 0 10*3/uL (ref 0.0–0.1)
Basophils Relative: 0 %
Eosinophils Absolute: 0 10*3/uL (ref 0.0–0.5)
Eosinophils Relative: 0 %
HCT: 38.2 % (ref 36.0–46.0)
Hemoglobin: 12 g/dL (ref 12.0–15.0)
Immature Granulocytes: 1 %
Lymphocytes Relative: 5 %
Lymphs Abs: 0.7 10*3/uL (ref 0.7–4.0)
MCH: 27.8 pg (ref 26.0–34.0)
MCHC: 31.4 g/dL (ref 30.0–36.0)
MCV: 88.6 fL (ref 80.0–100.0)
Monocytes Absolute: 1 10*3/uL (ref 0.1–1.0)
Monocytes Relative: 8 %
Neutro Abs: 11.5 10*3/uL — ABNORMAL HIGH (ref 1.7–7.7)
Neutrophils Relative %: 86 %
Platelets: 281 10*3/uL (ref 150–400)
RBC: 4.31 MIL/uL (ref 3.87–5.11)
RDW: 13.8 % (ref 11.5–15.5)
WBC: 13.3 10*3/uL — ABNORMAL HIGH (ref 4.0–10.5)
nRBC: 0 % (ref 0.0–0.2)

## 2024-01-02 LAB — LACTIC ACID, PLASMA: Lactic Acid, Venous: 1.7 mmol/L (ref 0.5–1.9)

## 2024-01-02 LAB — LIPASE, BLOOD: Lipase: 25 U/L (ref 11–51)

## 2024-01-02 MED ORDER — ONDANSETRON HCL 4 MG/2ML IJ SOLN
4.0000 mg | Freq: Once | INTRAMUSCULAR | Status: AC
  Administered 2024-01-02: 4 mg via INTRAVENOUS
  Filled 2024-01-02: qty 2

## 2024-01-02 MED ORDER — IOHEXOL 300 MG/ML  SOLN
75.0000 mL | Freq: Once | INTRAMUSCULAR | Status: AC | PRN
  Administered 2024-01-02: 80 mL via INTRAVENOUS

## 2024-01-02 MED ORDER — DICYCLOMINE HCL 10 MG PO CAPS
10.0000 mg | ORAL_CAPSULE | Freq: Once | ORAL | Status: AC
  Administered 2024-01-02: 10 mg via ORAL
  Filled 2024-01-02: qty 1

## 2024-01-02 MED ORDER — SODIUM CHLORIDE 0.9 % IV SOLN
2.0000 g | INTRAVENOUS | Status: DC
  Administered 2024-01-03 – 2024-01-04 (×2): 2 g via INTRAVENOUS
  Filled 2024-01-02 (×2): qty 20

## 2024-01-02 MED ORDER — SODIUM CHLORIDE 0.9 % IV BOLUS
1000.0000 mL | Freq: Once | INTRAVENOUS | Status: AC
  Administered 2024-01-02: 1000 mL via INTRAVENOUS

## 2024-01-02 MED ORDER — SODIUM CHLORIDE 0.9% FLUSH
3.0000 mL | Freq: Two times a day (BID) | INTRAVENOUS | Status: DC
  Administered 2024-01-03 – 2024-01-08 (×11): 3 mL via INTRAVENOUS

## 2024-01-02 MED ORDER — GABAPENTIN 300 MG PO CAPS
600.0000 mg | ORAL_CAPSULE | Freq: Two times a day (BID) | ORAL | Status: DC
  Administered 2024-01-03 – 2024-01-08 (×12): 600 mg via ORAL
  Filled 2024-01-02 (×12): qty 2

## 2024-01-02 MED ORDER — ACETAMINOPHEN 650 MG RE SUPP: 650.0000 mg | Freq: Four times a day (QID) | RECTAL | Status: DC | PRN

## 2024-01-02 MED ORDER — LACTATED RINGERS IV SOLN: INTRAVENOUS | Status: DC

## 2024-01-02 MED ORDER — APIXABAN 5 MG PO TABS
5.0000 mg | ORAL_TABLET | Freq: Two times a day (BID) | ORAL | Status: DC
  Administered 2024-01-03 – 2024-01-08 (×12): 5 mg via ORAL
  Filled 2024-01-02 (×12): qty 1

## 2024-01-02 MED ORDER — PANTOPRAZOLE SODIUM 40 MG PO TBEC
40.0000 mg | DELAYED_RELEASE_TABLET | Freq: Two times a day (BID) | ORAL | Status: DC
  Administered 2024-01-03 – 2024-01-08 (×11): 40 mg via ORAL
  Filled 2024-01-02 (×11): qty 1

## 2024-01-02 MED ORDER — ACETAMINOPHEN 325 MG PO TABS
650.0000 mg | ORAL_TABLET | Freq: Four times a day (QID) | ORAL | Status: DC | PRN
  Administered 2024-01-03 – 2024-01-06 (×7): 650 mg via ORAL
  Filled 2024-01-02 (×7): qty 2

## 2024-01-02 MED ORDER — FLUOXETINE HCL 20 MG PO CAPS
20.0000 mg | ORAL_CAPSULE | Freq: Every day | ORAL | Status: DC
  Administered 2024-01-03 – 2024-01-07 (×6): 20 mg via ORAL
  Filled 2024-01-02 (×6): qty 1

## 2024-01-02 MED ORDER — PROCHLORPERAZINE EDISYLATE 10 MG/2ML IJ SOLN
5.0000 mg | Freq: Four times a day (QID) | INTRAMUSCULAR | Status: DC | PRN
  Administered 2024-01-03 – 2024-01-08 (×8): 5 mg via INTRAVENOUS
  Filled 2024-01-02 (×11): qty 2

## 2024-01-02 MED ORDER — BUPROPION HCL 75 MG PO TABS
75.0000 mg | ORAL_TABLET | Freq: Every morning | ORAL | Status: DC
Start: 2024-01-03 — End: 2024-01-08
  Administered 2024-01-03 – 2024-01-08 (×6): 75 mg via ORAL
  Filled 2024-01-02 (×6): qty 1

## 2024-01-02 MED ORDER — SODIUM CHLORIDE 0.9 % IV SOLN
2.0000 g | Freq: Once | INTRAVENOUS | Status: AC
  Administered 2024-01-03: 2 g via INTRAVENOUS
  Filled 2024-01-02 (×2): qty 20

## 2024-01-02 MED ORDER — BISOPROLOL FUMARATE 5 MG PO TABS
2.5000 mg | ORAL_TABLET | Freq: Every day | ORAL | Status: DC
  Administered 2024-01-03 – 2024-01-08 (×6): 2.5 mg via ORAL
  Filled 2024-01-02 (×6): qty 1

## 2024-01-02 MED ORDER — FENTANYL CITRATE PF 50 MCG/ML IJ SOSY
12.5000 ug | PREFILLED_SYRINGE | INTRAMUSCULAR | Status: DC | PRN
Start: 2024-01-02 — End: 2024-01-03
  Administered 2024-01-03: 25 ug via INTRAVENOUS
  Filled 2024-01-02: qty 1

## 2024-01-02 MED ORDER — OXYCODONE HCL 5 MG PO TABS
2.5000 mg | ORAL_TABLET | ORAL | Status: DC | PRN
  Administered 2024-01-03 (×2): 2.5 mg via ORAL
  Filled 2024-01-02 (×2): qty 1

## 2024-01-02 MED ORDER — ATORVASTATIN CALCIUM 10 MG PO TABS
10.0000 mg | ORAL_TABLET | Freq: Every day | ORAL | Status: DC
  Administered 2024-01-03 – 2024-01-07 (×6): 10 mg via ORAL
  Filled 2024-01-02 (×6): qty 1

## 2024-01-02 MED ORDER — LEVOTHYROXINE SODIUM 88 MCG PO TABS
88.0000 ug | ORAL_TABLET | Freq: Every day | ORAL | Status: DC
  Administered 2024-01-03 – 2024-01-08 (×6): 88 ug via ORAL
  Filled 2024-01-02 (×6): qty 1

## 2024-01-02 NOTE — ED Notes (Signed)
 Pt a difficult stick. This RN tried to obtain [facility] via straight stick w/ butterfly and also by trying to accessing a PIV but was unsuccessful x2 attempts.Will make the floor aware.

## 2024-01-02 NOTE — ED Triage Notes (Signed)
 Patient to ED by EMS from home with c/o N/V/D and weakness for 2 days and fever since Saturday. She voices she is not able to ambulate like she use to but denies falls. She is clammy and c/o rectal and ABD pain. She has taken Tylenol  Q6hrs for her fever. 98/60 122 20 98.6 97% RA CBG: 128

## 2024-01-02 NOTE — ED Provider Notes (Signed)
 [facility] AT [facility] Provider Note   CSN: [id number] Arrival date & time: 01/02/24  1735     History Chief Complaint  Patient presents with   Nausea   Emesis   Diarrhea   Weakness    [name] is a 78 y.o. female with chronic atrial fibrillation on Eliquis , COPD, hypertension, hyperlipidemia who presents to the emergency department with abdominal pain, nausea, vomiting, and diarrhea.  Patient states that she did not feel well all last week but really her symptoms started on Saturday.  Patient and spouse at the bedside state that the patient is having numerous episodes of stool per day.  Patient is also vomiting but less frequency.  Patient is not having any urinary symptoms, chest pain, shortness of breath.  She does endorse associated subjective fever and chills.   Emesis Associated symptoms: diarrhea   Diarrhea Associated symptoms: vomiting   Weakness Associated symptoms: diarrhea and vomiting        Home Medications Prior to Admission medications   Medication Sig Start Date End Date Taking? Authorizing Provider  acetaminophen  (TYLENOL ) 325 MG tablet Take 2 tablets (650 mg total) by mouth every 8 (eight) hours. Patient taking differently: Take 650 mg by mouth in the morning and at bedtime. 02/18/19  Yes Love, Renay Carota, PA-C  apixaban  (ELIQUIS ) 5 MG TABS tablet Take 1 tablet (5 mg total) by mouth 2 (two) times daily. 09/14/23  Yes [place], Peter M, MD  ascorbic acid (VITAMIN C) 500 MG tablet Take 500 mg by mouth daily.   Yes [provider]  atorvastatin  (LIPITOR) 10 MG tablet Take 1 tablet (10 mg total) by mouth daily. 02/18/19  Yes Love, Renay Carota, PA-C  bisoprolol  (ZEBETA ) 5 MG tablet Take 2.5 mg by mouth daily. 03/11/23  Yes [provider]  buPROPion  (WELLBUTRIN ) 75 MG tablet Take 75 mg by mouth every morning. 12/31/20  Yes [provider]  Cholecalciferol (VITAMIN D) 125 MCG (5000 UT) CAPS Take 5,000 Units  by mouth daily.   Yes [provider]  FLUoxetine  (PROZAC ) 10 MG capsule Take 20 mg by mouth at bedtime.   Yes [provider]  FLUoxetine  (PROZAC ) 20 MG capsule Take 1 capsule (20 mg total) by mouth at bedtime. 02/18/19  Yes Love, Renay Carota, PA-C  gabapentin  (NEURONTIN ) 600 MG tablet Take 600 mg by mouth in the morning and at bedtime.   Yes [provider]  levothyroxine  (SYNTHROID ) 88 MCG tablet Take 1 tablet (88 mcg total) by mouth daily before breakfast. 03/13/19  Yes [place], Peter M, MD  loratadine  (CLARITIN ) 10 MG tablet Take 10 mg by mouth daily.   Yes [provider]  omeprazole  (PRILOSEC ) 20 MG capsule Take 1 capsule (20 mg total) by mouth 2 (two) times daily before a meal. 07/09/23  Yes Zehr, Jessica D, PA-C  ondansetron  (ZOFRAN -ODT) 4 MG disintegrating tablet Take 1 tablet (4 mg total) by mouth every 8 (eight) hours as needed for nausea or vomiting. 05/26/23  Yes Sheikh, Omair Latif, DO  saccharomyces boulardii (FLORASTOR) 250 MG capsule Take 1 capsule (250 mg total) by mouth 2 (two) times daily. 12/10/23  Yes May, Deanna J, NP      Allergies    Penicillins    Review of Systems   Review of Systems  Gastrointestinal:  Positive for diarrhea and vomiting.  Neurological:  Positive for weakness.  All other systems reviewed and are negative.   Physical Exam Updated Vital Signs BP 129/77  Pulse (!) 106   Temp (!) 101.3 F (38.5 C) (Oral)   Resp 20   Ht 5\' 6"  (1.676 m)   Wt 58.1 kg   SpO2 97%   BMI 20.66 kg/m  Physical Exam Vitals and nursing note reviewed.  Constitutional:      General: She is not in acute distress.    Appearance: Normal appearance.     Comments: Dry mucous membranes.  HENT:     Head: Normocephalic and atraumatic.  Eyes:     General:        Right eye: No discharge.        Left eye: No discharge.  Cardiovascular:     Comments: Tachycardic and irregularly irregular. S1/S2 are distinct without any evidence of murmur,  rubs, or gallops.  Radial pulses are 2+ bilaterally.  Dorsalis pedis pulses are 2+ bilaterally.  No evidence of pedal edema. Pulmonary:     Comments: Clear to auscultation bilaterally.  Normal effort.  No respiratory distress.  No evidence of wheezes, rales, or rhonchi heard throughout. Abdominal:     General: Abdomen is flat. Bowel sounds are normal. There is no distension.     Tenderness: There is generalized abdominal tenderness. There is no guarding or rebound.  Musculoskeletal:        General: Normal range of motion.     Cervical back: Neck supple.  Skin:    General: Skin is warm and dry.     Findings: No rash.  Neurological:     General: No focal deficit present.     Mental Status: She is alert.  Psychiatric:        Mood and Affect: Mood normal.        Behavior: Behavior normal.     ED Results / Procedures / Treatments   Labs (all labs ordered are listed, but only abnormal results are displayed) Labs Reviewed  CBC WITH DIFFERENTIAL/PLATELET - Abnormal; Notable for the following components:      Result Value   WBC 13.3 (*)    Neutro Abs 11.5 (*)    Abs Immature Granulocytes 0.09 (*)    All other components within normal limits  COMPREHENSIVE METABOLIC PANEL WITH GFR - Abnormal; Notable for the following components:   BUN 25 (*)    Creatinine, Ser 1.64 (*)    Albumin 3.3 (*)    GFR, Estimated 32 (*)    All other components within normal limits  URINALYSIS, ROUTINE W REFLEX MICROSCOPIC - Abnormal; Notable for the following components:   Color, Urine AMBER (*)    APPearance CLOUDY (*)    Hgb urine dipstick SMALL (*)    Ketones, ur 5 (*)    Protein, ur 100 (*)    Nitrite POSITIVE (*)    Leukocytes,Ua LARGE (*)    Bacteria, UA MANY (*)    Non Squamous Epithelial 0-5 (*)    All other components within normal limits  URINE CULTURE  STOOL CULTURE  RESP PANEL BY RT-PCR (RSV, FLU A&B, COVID)  RVPGX2  CULTURE, BLOOD (ROUTINE X 2)  CULTURE, BLOOD (ROUTINE X 2)  C  DIFFICILE QUICK SCREEN W PCR REFLEX    LIPASE, BLOOD  LACTIC ACID, PLASMA  PROTIME-INR  BASIC METABOLIC PANEL WITH GFR  MAGNESIUM   CBC    EKG None  Radiology CT ABDOMEN PELVIS W CONTRAST Result Date: 01/02/2024 CLINICAL DATA:  Abdominal pain, acute, nonlocalized EXAM: CT ABDOMEN AND PELVIS WITH CONTRAST TECHNIQUE: Multidetector CT imaging of the abdomen and pelvis was performed  using the standard protocol following bolus administration of intravenous contrast. RADIATION DOSE REDUCTION: This exam was performed according to the departmental dose-optimization program which includes automated exposure control, adjustment of the mA and/or kV according to patient size and/or use of iterative reconstruction technique. CONTRAST:  80mL OMNIPAQUE  IOHEXOL  300 MG/ML  SOLN COMPARISON:  May 22, 2023, February 11, 2019 FINDINGS: Lower chest: No focal airspace consolidation or pleural effusion.Mild cardiomegaly with right atrial predominance. Hepatobiliary: Multiple small cysts scattered throughout both lobes of the liver. A large lesion in the left hepatic lobe with chunky calcification, measuring 5 cm, is unchanged, likely a hemangioma given the stability. Subtle arterially enhancing lesion in the posterior right hepatic lobe (axial 19), also likely a small flash filling hemangioma. Cholecystectomy. Mild dilation of the intrahepatic and extrahepatic bile ducts, likely related to the prior cholecystectomy. The portal veins are patent. Pancreas: No mass. Unchanged prominence of the main pancreatic duct, possibly age-related ectasia. No peripancreatic inflammation or fluid collection. Spleen: Normal size. No mass. Adrenals/Urinary Tract: No adrenal masses. The right kidney is not visualized. No left renal mass. Moderate left-sided hydroureteronephrosis. No visualized ureteral calculus. Mild left-sided perinephric stranding. Asymmetric wall thickening along the posterior bladder. Stomach/Bowel: Moderate-sized  sliding-type hiatal hernia. The stomach is decompressed without focal abnormality. No small bowel wall thickening or inflammation. No small bowel obstruction.The appendix was not visualized. No right lower quadrant or pericecal inflammatory changes to suggest acute appendicitis. Vascular/Lymphatic: No aortic aneurysm. Diffuse aortoiliac atherosclerosis. No intraabdominal or pelvic lymphadenopathy. Reproductive: Hysterectomy. No concerning adnexal mass.no free pelvic fluid. Other: No pneumoperitoneum or ascites Musculoskeletal: No acute fracture or destructive lesion. Osteopenia. Right hip arthroplasty is anatomically aligned without dislocation. Lumbar fusion hardware at L3-L4, unchanged. Multilevel degenerative disc disease of the spine. Moderate left hip osteoarthritis. IMPRESSION: 1. Moderate left-sided hydroureteronephrosis without obstructive ureteral calculus visualized. Given the left perinephric stranding, this may represent changes from a recently passed calculus or an ascending urinary tract infection. Correlation with urinalysis recommended. 2. Asymmetric wall thickening along the posterior aspect of the urinary bladder, possibly due to acute cystitis. Nonemergent CT urogram (with metallic artifact reduction technique) should also be considered to exclude underlying neoplasm. 3. Moderate-sized hiatal hernia. Electronically Signed   By: Rance Burrows M.D.   On: 01/02/2024 21:57    Procedures .Critical Care  Performed by: Darletta Ehrich, PA-C Authorized by: Darletta Ehrich, PA-C   Critical care provider statement:    Critical care time (minutes):  35   Critical care time was exclusive of:  Separately billable procedures and treating other patients   Critical care was necessary to treat or prevent imminent or life-threatening deterioration of the following conditions:  Sepsis   Critical care was time spent personally by me on the following activities:  Blood draw for specimens, development  of treatment plan with patient or surrogate, discussions with consultants, ordering and performing treatments and interventions, ordering and review of laboratory studies, ordering and review of radiographic studies and re-evaluation of patient's condition     Medications Ordered in ED Medications  cefTRIAXone  (ROCEPHIN ) 2 g in sodium chloride  0.9 % 100 mL IVPB (has no administration in time range)  dicyclomine (BENTYL) capsule 10 mg (has no administration in time range)  ondansetron  (ZOFRAN ) injection 4 mg (has no administration in time range)  lactated ringers  infusion (has no administration in time range)  atorvastatin  (LIPITOR) tablet 10 mg (has no administration in time range)  bisoprolol  (ZEBETA ) tablet 2.5 mg (has no administration in time range)  buPROPion  (WELLBUTRIN ) tablet 75 mg (has no administration in time range)  FLUoxetine  (PROZAC ) capsule 20 mg (has no administration in time range)  levothyroxine  (SYNTHROID ) tablet 88 mcg (has no administration in time range)  pantoprazole  (PROTONIX ) EC tablet 40 mg (has no administration in time range)  apixaban  (ELIQUIS ) tablet 5 mg (has no administration in time range)  gabapentin  (NEURONTIN ) capsule 600 mg (has no administration in time range)  sodium chloride  flush (NS) 0.9 % injection 3 mL (has no administration in time range)  acetaminophen  (TYLENOL ) tablet 650 mg (has no administration in time range)    Or  acetaminophen  (TYLENOL ) suppository 650 mg (has no administration in time range)  prochlorperazine (COMPAZINE) injection 5 mg (has no administration in time range)  cefTRIAXone  (ROCEPHIN ) 2 g in sodium chloride  0.9 % 100 mL IVPB (has no administration in time range)  sodium chloride  0.9 % bolus 1,000 mL (1,000 mLs Intravenous Bolus 01/02/24 1952)  iohexol  (OMNIPAQUE ) 300 MG/ML solution 75 mL (80 mLs Intravenous Contrast Given 01/02/24 2116)    ED Course/ Medical Decision Making/ A&P Clinical Course as of 01/02/24 2312  Wed Jan 02, 2024  2235 I spoke with the son-in-law who is in healthcare per the family's request and he was stating that 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 bank several times.  He also stated that she suffers from recurrent UTI and under the care of Dr. Freddi Jaeger with urology.  [CF]  2310 I spoke with Dr. Brice Campi with [facility] who agrees to admit the patient.  [CF]    Clinical Course User Index [CF] Darletta Ehrich, PA-C   {   Click here for ABCD2, HEART and other calculators  Medical Decision Making LIZBETTE WHITCOMB is a 78 y.o. female patient who presents to the emergency department today for further evaluation of nausea, vomiting, diarrhea, and abdominal pain.  Patient currently in atrial fibrillation and RVR.  Overall dry appearing likely dehydrated.  Will likely give her some fluids.  Will proceed with a CT scan and abdominal pain labs.  Patient meets SIRS criteria with leukocytosis and tachycardia.  This is a presumed positive sepsis and code sepsis was initiated as it is likely a urinary source.  Given that the patient does suffer from recurrent UTIs also likely more evidence for urinary source.  I presume a pyelonephritis given the findings on the CT scan.  Blood cultures were obtained, urine culture sent, and antibiotics will be started.  I will also send off a C. difficile of test considering that the patient does have a history of C. difficile.  Due to the patient's condition and overall findings of sepsis I do feel the patient would likely benefit from further evaluation in the hospital.   Amount and/or Complexity of Data Reviewed Labs: ordered. Radiology: ordered.  Risk Prescription drug management. Decision regarding hospitalization.    Final Clinical Impression(s) / ED Diagnoses Final diagnoses:  Sepsis, due to unspecified organism, unspecified whether acute organ dysfunction present [facility])  Pyelonephritis    Rx / DC Orders ED Discharge Orders     None          Darletta Ehrich, PA-C 01/02/24 2312    Merdis Stalling, MD 01/02/24 2317

## 2024-01-02 NOTE — ED Notes (Signed)
 Pt states she needs to have a BM after obtaining triage VS. Pt placed on bedpan. Call bell in reach

## 2024-01-02 NOTE — Sepsis Progress Note (Signed)
 Elink monitoring for the code sepsis protocol.

## 2024-01-02 NOTE — ED Notes (Signed)
 Pt attempted to give UA sample in bed pan but had diarrhea instead. UA sample was contaminated and unusable.
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
## 2024-01-03 DIAGNOSIS — N12 Tubulo-interstitial nephritis, not specified as acute or chronic: Secondary | ICD-10-CM | POA: Diagnosis not present

## 2024-01-03 LAB — CBC
HCT: 35.7 % — ABNORMAL LOW (ref 36.0–46.0)
Hemoglobin: 10.6 g/dL — ABNORMAL LOW (ref 12.0–15.0)
MCH: 27.8 pg (ref 26.0–34.0)
MCHC: 29.7 g/dL — ABNORMAL LOW (ref 30.0–36.0)
MCV: 93.7 fL (ref 80.0–100.0)
Platelets: 257 10*3/uL (ref 150–400)
RBC: 3.81 MIL/uL — ABNORMAL LOW (ref 3.87–5.11)
RDW: 14 % (ref 11.5–15.5)
WBC: 11.5 10*3/uL — ABNORMAL HIGH (ref 4.0–10.5)
nRBC: 0 % (ref 0.0–0.2)

## 2024-01-03 LAB — BASIC METABOLIC PANEL WITH GFR
Anion gap: 11 (ref 5–15)
BUN: 25 mg/dL — ABNORMAL HIGH (ref 8–23)
CO2: 20 mmol/L — ABNORMAL LOW (ref 22–32)
Calcium: 9.2 mg/dL (ref 8.9–10.3)
Chloride: 99 mmol/L (ref 98–111)
Creatinine, Ser: 1.3 mg/dL — ABNORMAL HIGH (ref 0.44–1.00)
GFR, Estimated: 42 mL/min — ABNORMAL LOW (ref >60)
Glucose, Bld: 130 mg/dL — ABNORMAL HIGH (ref 70–99)
Potassium: 2.9 mmol/L — ABNORMAL LOW (ref 3.5–5.1)
Sodium: 130 mmol/L — ABNORMAL LOW (ref 135–145)

## 2024-01-03 LAB — RESP PANEL BY RT-PCR (RSV, FLU A&B, COVID)  RVPGX2
Influenza A by PCR: NEGATIVE
Influenza B by PCR: NEGATIVE
Resp Syncytial Virus by PCR: NEGATIVE
SARS Coronavirus 2 by RT PCR: NEGATIVE

## 2024-01-03 LAB — PROTIME-INR
INR: 1.5 — ABNORMAL HIGH (ref 0.8–1.2)
Prothrombin Time: 18.2 s — ABNORMAL HIGH (ref 11.4–15.2)

## 2024-01-03 LAB — MAGNESIUM: Magnesium: 1.8 mg/dL (ref 1.7–2.4)

## 2024-01-03 MED ORDER — VANCOMYCIN HCL 125 MG PO CAPS
125.0000 mg | ORAL_CAPSULE | Freq: Two times a day (BID) | ORAL | Status: DC
  Administered 2024-01-03 – 2024-01-08 (×11): 125 mg via ORAL
  Filled 2024-01-03 (×11): qty 1

## 2024-01-03 MED ORDER — ENSURE ENLIVE PO LIQD
237.0000 mL | Freq: Two times a day (BID) | ORAL | Status: DC
  Administered 2024-01-06 – 2024-01-08 (×2): 237 mL via ORAL

## 2024-01-03 MED ORDER — MAGNESIUM SULFATE 2 GM/50ML IV SOLN
2.0000 g | Freq: Once | INTRAVENOUS | Status: AC
  Administered 2024-01-03: 2 g via INTRAVENOUS
  Filled 2024-01-03: qty 50

## 2024-01-03 MED ORDER — RISAQUAD PO CAPS
2.0000 | ORAL_CAPSULE | Freq: Every day | ORAL | Status: DC
  Administered 2024-01-03 – 2024-01-08 (×6): 2 via ORAL
  Filled 2024-01-03 (×6): qty 2

## 2024-01-03 MED ORDER — POTASSIUM CHLORIDE CRYS ER 20 MEQ PO TBCR
40.0000 meq | EXTENDED_RELEASE_TABLET | Freq: Once | ORAL | Status: AC
  Administered 2024-01-03: 40 meq via ORAL
  Filled 2024-01-03: qty 2

## 2024-01-03 MED ORDER — LIDOCAINE 5 % EX PTCH
1.0000 | MEDICATED_PATCH | CUTANEOUS | Status: AC
  Administered 2024-01-03 – 2024-01-05 (×3): 1 via TRANSDERMAL
  Filled 2024-01-03 (×3): qty 1

## 2024-01-03 NOTE — TOC Initial Note (Signed)
 Transition of Care [facility]) - Initial/Assessment Note    Patient Details  Name: [name] MRN: [id number] Date of Birth: 07-27-1946  Transition of Care [facility] CM/SW Contact:    Ruben Corolla, RN Phone Number: 01/03/2024, 12:10 PM  Clinical Narrative: d/c plan home. Has own transport home.                  Expected Discharge Plan: Home/Self Care Barriers to Discharge: Continued Medical Work up   Patient Goals and CMS Choice Patient states their goals for this hospitalization and ongoing recovery are:: Home CMS Medicare.gov Compare Post Acute Care list provided to:: Patient Choice offered to / list presented to : Patient [place] ownership interest in [facility].provided to:: Patient    Expected Discharge Plan and Services   Discharge Planning Services: CM Consult   Living arrangements for the past 2 months: Single Family Home                                      Prior Living Arrangements/Services Living arrangements for the past 2 months: Single Family Home Lives with:: Spouse   Do you feel safe going back to the place where you live?: Yes          Current home services: DME (cane,rw,rollator,3n1,shower chair)    Activities of Daily Living   ADL Screening (condition at time of admission) Independently performs ADLs?: No Does the patient have a NEW difficulty with bathing/dressing/toileting/self-feeding that is expected to last >3 days?: No Does the patient have a NEW difficulty with getting in/out of bed, walking, or climbing stairs that is expected to last >3 days?: No Does the patient have a NEW difficulty with communication that is expected to last >3 days?: No Is the patient deaf or have difficulty hearing?: Yes Does the patient have difficulty seeing, even when wearing glasses/contacts?: No Does the patient have difficulty concentrating, remembering, or making decisions?: No  Permission Sought/Granted Permission sought to share  information with : Case Manager                Emotional Assessment              Admission diagnosis:  Pyelonephritis [N12] AKI (acute kidney injury) (HCC) [N17.9] Sepsis, due to unspecified organism, unspecified whether acute organ dysfunction present [facility]) [A41.9] Patient Active Problem List   Diagnosis Date Noted   Pyelonephritis 01/02/2024   Acute renal failure superimposed on stage 3a chronic kidney disease (HCC) 01/02/2024   COPD (chronic obstructive pulmonary disease) (HCC)    Gastritis and gastroduodenitis 07/12/2023   Dilated pancreatic duct 07/12/2023   Dilation of biliary tract 07/03/2023   Acute cystitis 05/22/2023   Abdominal pain 05/22/2023   History of CVA (cerebrovascular accident) 05/22/2023   GAD (generalized anxiety disorder) 05/22/2023   Lung cancer (HCC) 03/01/2023   Lung nodule 02/15/2023   Clostridioides difficile infection 12/11/2022   Malnutrition of moderate degree (HCC) 12/08/2022   Intractable nausea and vomiting 12/07/2022   Hyponatremia 12/07/2022   Acute urinary retention 06/18/2022   Acute metabolic encephalopathy 06/17/2022   Pyuria 06/17/2022   GERD (gastroesophageal reflux disease) 06/17/2022   Hyperglycemia 06/17/2022   Low back pain 05/28/2020   Dilation of pancreatic duct 05/11/2020   Anterior dislocation of right shoulder 06/17/2019   Shoulder pain, right 06/17/2019   Duodenitis 02/24/2019   Chronic pain syndrome 02/24/2019   Debility 02/13/2019  Nausea and vomiting    Aspiration pneumonia (HCC)    Diarrhea    Pressure injury of skin 02/08/2019   Pneumonia of right lower lobe due to methicillin susceptible Staphylococcus aureus (MSSA) (HCC)    Volume overload state of heart    Acute encephalopathy 02/01/2019   Acute respiratory failure with hypoxia (HCC) 02/01/2019   Hypokalemia 02/01/2019   Hypomagnesemia 02/01/2019   Leukocytosis 02/01/2019   Osteoarthritis 02/01/2019   Hypothyroidism 02/01/2019   Paroxysmal A-fib  (HCC)    Anticoagulant long-term use    Persistent atrial fibrillation (HCC) 06/27/2017   Hip fracture (HCC) 06/27/2017   Irregular heart rate    Closed right hip fracture, initial encounter (HCC) 06/26/2017   Protein-calorie malnutrition, severe 06/30/2016   S/P laparoscopic cholecystectomy 06/28/2016   Visual field loss following stroke    Hyperlipidemia    Essential hypertension    PCP:  Lonzie Robins, MD Pharmacy:   [facility] [id number] - [place], [place] - [name] [place] [address] Phone: [phone] Fax: [phone]  [facility] [address] [place] [address] Phone: [phone] Fax: [phone]     Social Drivers of Health (SDOH) Social History: SDOH Screenings   Food Insecurity: No Food Insecurity (01/03/2024)  Housing: Low Risk  (01/03/2024)  Transportation Needs: No Transportation Needs (01/03/2024)  Utilities: Not At Risk (01/03/2024)  Depression (PHQ2-9): Low Risk  (02/28/2023)  Tobacco Use: Medium Risk (01/02/2024)   SDOH Interventions:     Readmission Risk Interventions    05/23/2023   10:20 AM  Readmission Risk Prevention Plan  Transportation Screening Complete  PCP or Specialist Appt within 5-7 Days Complete  Home Care Screening Complete  Medication Review (RN CM) Complete

## 2024-01-03 NOTE — Progress Notes (Signed)
 PROGRESS NOTE    [name]  WUJ:[id number] DOB: 06/26/1946 DOA: 01/02/2024 PCP: Lonzie Robins, MD    Brief Narrative:  [name] is a 78 y.o. female with medical history significant for CKD 3A, history of CVA, atrial fibrillation on Eliquis , depression, anxiety, hypothyroidism, recent treatment for C. difficile colitis, and recurrent UTIs who presents with abdominal pain, fever, nausea, vomiting, and diarrhea.   Patient reports that her diarrhea improved significantly with treatment of recurrent C. difficile last month, but she began to feel poorly again on 12/28/2023.  Since then, she has had 8-10 episodes of diarrhea daily, nausea, vomiting, abdominal pain, dysuria, fevers, and fatigue.   Assessment and Plan:    Pyelonephritis; asymmetric bladder wall thickening  - Continue Rocephin  -follow cultures - follow-up with urology after discharge  for  Nonemergent CT urogram (with metallic artifact reduction technique) should also be considered to exclude underlying neoplasm   AKI superimposed on CKD 3A  - Trending down post IV fluids   Atrial fibrillation  - Eliquis , bisoprolol       Hx of CVA  - Eliquis , Lipitor      Non-small cell lung cancer   - S/p SBRT to RML nodule in July 2024  - Due for follow-up with Dr. Marguerita Shih     . Diarrhea  Recent C. difficile infection - Reached out to ID and plan will be to treat with oral vancomycin  125 twice daily while on antibiotics and for 7 days after completion   Hypothyroidism  - Synthroid    Hypokalemia - Replete  DVT prophylaxis:  apixaban  (ELIQUIS ) tablet 5 mg    Code Status: Full Code Family Communication: Husband at bedside  Disposition Plan:  Level of care: Telemetry Status is: Inpatient     Consultants:  none   Subjective: Still having diarrhea - Says stools were normalized after treatment  Objective: Vitals:   01/03/24 0024 01/03/24 0421 01/03/24 0650 01/03/24 1035  BP: 128/86 120/81 111/67 97/65   Pulse: 90 99 91 79  Resp: 17 18 16 18   Temp: 100.1 F (37.8 C) (!) 101.5 F (38.6 C) 99.6 F (37.6 C) 99.1 F (37.3 C)  TempSrc: Oral Oral Oral Oral  SpO2: 98% 100% 99% 98%  Weight:      Height:        Intake/Output Summary (Last 24 hours) at 01/03/2024 1044 Last data filed at 01/03/2024 1004 Gross per 24 hour  Intake 940 ml  Output --  Net 940 ml   Filed Weights   01/02/24 1750  Weight: 58.1 kg    Examination:   General: Appearance:    Well developed, well nourished female in no acute distress     Lungs:     respirations unlabored  Heart:    Normal heart rate.    MS:   All extremities are intact.    Neurologic:   Awake, alert, oriented x 3. No apparent focal neurological           defect.        Data Reviewed: I have personally reviewed following labs and imaging studies  CBC: Recent Labs  Lab 01/02/24 1900 01/03/24 0021  WBC 13.3* 11.5*  NEUTROABS 11.5*  --   HGB 12.0 10.6*  HCT 38.2 35.7*  MCV 88.6 93.7  PLT 281 257   Basic Metabolic Panel: Recent Labs  Lab 01/02/24 1900 01/03/24 0021  NA 136 130*  K 3.9 2.9*  CL 101 99  CO2 24 20*  GLUCOSE 98 130*  BUN 25* 25*  CREATININE 1.64* 1.30*  CALCIUM  10.3 9.2  MG  --  1.8   GFR: Estimated Creatinine Clearance: 32.7 mL/min (A) (by C-G formula based on SCr of 1.3 mg/dL (H)). Liver Function Tests: Recent Labs  Lab 01/02/24 1900  AST 21  ALT 17  ALKPHOS 86  BILITOT 1.1  PROT 7.3  ALBUMIN 3.3*   Recent Labs  Lab 01/02/24 1900  LIPASE 25   No results for input(s): "AMMONIA" in the last 168 hours. Coagulation Profile: Recent Labs  Lab 01/03/24 0021  INR 1.5*   Cardiac Enzymes: No results for input(s): "CKTOTAL", "CKMB", "CKMBINDEX", "TROPONINI" in the last 168 hours. BNP (last 3 results) No results for input(s): "PROBNP" in the last 8760 hours. HbA1C: No results for input(s): "HGBA1C" in the last 72 hours. CBG: No results for input(s): "GLUCAP" in the last 168 hours. Lipid  Profile: No results for input(s): "CHOL", "HDL", "LDLCALC", "TRIG", "CHOLHDL", "LDLDIRECT" in the last 72 hours. Thyroid  Function Tests: No results for input(s): "TSH", "T4TOTAL", "FREET4", "T3FREE", "THYROIDAB" in the last 72 hours. Anemia Panel: No results for input(s): "VITAMINB12", "FOLATE", "FERRITIN", "TIBC", "IRON", "RETICCTPCT" in the last 72 hours. Sepsis Labs: Recent Labs  Lab 01/02/24 1939  LATICACIDVEN 1.7    Recent Results (from the past 240 hours)  Resp panel by RT-PCR (RSV, Flu A&B, Covid) Anterior Nasal Swab     Status: None   Collection Time: 01/02/24 11:42 PM   Specimen: Anterior Nasal Swab  Result Value Ref Range Status   SARS Coronavirus 2 by RT PCR NEGATIVE NEGATIVE Final    Comment: (NOTE) SARS-CoV-2 target nucleic acids are NOT DETECTED.  The SARS-CoV-2 RNA is generally detectable in upper respiratory specimens during the acute phase of infection. The lowest concentration of SARS-CoV-2 viral copies this assay can detect is 138 copies/mL. A negative result does not preclude SARS-Cov-2 infection and should not be used as the sole basis for treatment or other patient management decisions. A negative result may occur with  improper specimen collection/handling, submission of specimen other than nasopharyngeal swab, presence of viral mutation(s) within the areas targeted by this assay, and inadequate number of viral copies(<138 copies/mL). A negative result must be combined with clinical observations, patient history, and epidemiological information. The expected result is Negative.  Fact Sheet for Patients:  BloggerCourse.com  Fact Sheet for Healthcare Providers:  SeriousBroker.it  This test is no t yet approved or cleared by the [place]  FDA and  has been authorized for detection and/or diagnosis of SARS-CoV-2 by FDA under an Emergency Use Authorization (EUA). This EUA will remain  in effect (meaning  this test can be used) for the duration of the COVID-19 declaration under Section 564(b)(1) of the Act, 21 U.S.C.section 360bbb-3(b)(1), unless the authorization is terminated  or revoked sooner.       Influenza A by PCR NEGATIVE NEGATIVE Final   Influenza B by PCR NEGATIVE NEGATIVE Final    Comment: (NOTE) The Xpert Xpress SARS-CoV-2/FLU/RSV plus assay is intended as an aid in the diagnosis of influenza from Nasopharyngeal swab specimens and should not be used as a sole basis for treatment. Nasal washings and aspirates are unacceptable for Xpert Xpress SARS-CoV-2/FLU/RSV testing.  Fact Sheet for Patients: BloggerCourse.com  Fact Sheet for Healthcare Providers: SeriousBroker.it  This test is not yet approved or cleared by the [place]  FDA and has been authorized for detection and/or diagnosis of SARS-CoV-2 by FDA under an Emergency Use Authorization (EUA). This EUA will remain in effect (  meaning this test can be used) for the duration of the COVID-19 declaration under Section 564(b)(1) of the Act, 21 U.S.C. section 360bbb-3(b)(1), unless the authorization is terminated or revoked.     Resp Syncytial Virus by PCR NEGATIVE NEGATIVE Final    Comment: (NOTE) Fact Sheet for Patients: BloggerCourse.com  Fact Sheet for Healthcare Providers: SeriousBroker.it  This test is not yet approved or cleared by the [place]  FDA and has been authorized for detection and/or diagnosis of SARS-CoV-2 by FDA under an Emergency Use Authorization (EUA). This EUA will remain in effect (meaning this test can be used) for the duration of the COVID-19 declaration under Section 564(b)(1) of the Act, 21 U.S.C. section 360bbb-3(b)(1), unless the authorization is terminated or revoked.  Performed at [facility], [address], [place], [place] [address]   Blood Culture  (routine x 2)     Status: None (Preliminary result)   Collection Time: 01/03/24 12:21 AM   Specimen: BLOOD RIGHT ARM  Result Value Ref Range Status   Specimen Description   Final    BLOOD RIGHT ARM Performed at [facility], [address], [place], [place] [address]    Special Requests   Final    BOTTLES DRAWN AEROBIC ONLY Blood Culture results may not be optimal due to an inadequate volume of blood received in culture bottles Performed at [facility], [address], [place], [place] [address]    Culture PENDING  Incomplete   Report Status PENDING  Incomplete         Radiology Studies: DG Chest Port 1 View Result Date: 01/02/2024 CLINICAL DATA:  Sepsis EXAM: PORTABLE CHEST 1 VIEW COMPARISON:  02/20/2023 FINDINGS: Focal consolidation within the right mid lung zone is present, similar to that noted on CT examination of 12/21/2023. No pneumothorax or pleural effusion. Cardiac size is mildly enlarged, unchanged. Pulmonary vascularity is normal. No acute bone abnormality. IMPRESSION: 1. Focal consolidation within the right mid lung zone, similar to that noted on CT examination of 12/21/2023. Electronically Signed   By: Worthy Heads M.D.   On: 01/02/2024 23:10   CT ABDOMEN PELVIS W CONTRAST Result Date: 01/02/2024 CLINICAL DATA:  Abdominal pain, acute, nonlocalized EXAM: CT ABDOMEN AND PELVIS WITH CONTRAST TECHNIQUE: Multidetector CT imaging of the abdomen and pelvis was performed using the standard protocol following bolus administration of intravenous contrast. RADIATION DOSE REDUCTION: This exam was performed according to the departmental dose-optimization program which includes automated exposure control, adjustment of the mA and/or kV according to patient size and/or use of iterative reconstruction technique. CONTRAST:  80mL OMNIPAQUE  IOHEXOL  300 MG/ML  SOLN COMPARISON:  May 22, 2023, February 11, 2019 FINDINGS: Lower chest: No focal airspace consolidation or  pleural effusion.Mild cardiomegaly with right atrial predominance. Hepatobiliary: Multiple small cysts scattered throughout both lobes of the liver. A large lesion in the left hepatic lobe with chunky calcification, measuring 5 cm, is unchanged, likely a hemangioma given the stability. Subtle arterially enhancing lesion in the posterior right hepatic lobe (axial 19), also likely a small flash filling hemangioma. Cholecystectomy. Mild dilation of the intrahepatic and extrahepatic bile ducts, likely related to the prior cholecystectomy. The portal veins are patent. Pancreas: No mass. Unchanged prominence of the main pancreatic duct, possibly age-related ectasia. No peripancreatic inflammation or fluid collection. Spleen: Normal size. No mass. Adrenals/Urinary Tract: No adrenal masses. The right kidney is not visualized. No left renal mass. Moderate left-sided hydroureteronephrosis. No visualized ureteral calculus. Mild left-sided perinephric stranding. Asymmetric  wall thickening along the posterior bladder. Stomach/Bowel: Moderate-sized sliding-type hiatal hernia. The stomach is decompressed without focal abnormality. No small bowel wall thickening or inflammation. No small bowel obstruction.The appendix was not visualized. No right lower quadrant or pericecal inflammatory changes to suggest acute appendicitis. Vascular/Lymphatic: No aortic aneurysm. Diffuse aortoiliac atherosclerosis. No intraabdominal or pelvic lymphadenopathy. Reproductive: Hysterectomy. No concerning adnexal mass.no free pelvic fluid. Other: No pneumoperitoneum or ascites Musculoskeletal: No acute fracture or destructive lesion. Osteopenia. Right hip arthroplasty is anatomically aligned without dislocation. Lumbar fusion hardware at L3-L4, unchanged. Multilevel degenerative disc disease of the spine. Moderate left hip osteoarthritis. IMPRESSION: 1. Moderate left-sided hydroureteronephrosis without obstructive ureteral calculus visualized. Given  the left perinephric stranding, this may represent changes from a recently passed calculus or an ascending urinary tract infection. Correlation with urinalysis recommended. 2. Asymmetric wall thickening along the posterior aspect of the urinary bladder, possibly due to acute cystitis. Nonemergent CT urogram (with metallic artifact reduction technique) should also be considered to exclude underlying neoplasm. 3. Moderate-sized hiatal hernia. Electronically Signed   By: Rance Burrows M.D.   On: 01/02/2024 21:57        Scheduled Meds:  acidophilus  2 capsule Oral Daily   apixaban   5 mg Oral BID   atorvastatin   10 mg Oral QHS   bisoprolol   2.5 mg Oral Daily   buPROPion   75 mg Oral q morning   feeding supplement  237 mL Oral BID BM   FLUoxetine   20 mg Oral QHS   gabapentin   600 mg Oral BID   levothyroxine   88 mcg Oral Q0600   pantoprazole   40 mg Oral BID AC   sodium chloride  flush  3 mL Intravenous Q12H   vancomycin   125 mg Oral BID   Continuous Infusions:  cefTRIAXone  (ROCEPHIN )  IV       LOS: 1 day    Time spent: 45 minutes spent on chart review, discussion with nursing staff, consultants, updating family and interview/physical exam; more than 50% of that time was spent in counseling and/or coordination of care.    Enrigue Harvard, DO [facility] Available via Epic secure chat 7am-7pm After these hours, please refer to coverage provider listed on [email or web address] 01/03/2024, 10:44 AM

## 2024-01-03 NOTE — Progress Notes (Addendum)
 1946 Laren, Press photographer spoke with Furman Jobs, RN regarding a request made by family to not administer narcotic pain medication to [name].  Heman Que, RN stated that she would contact Rockland Churn, daughter for more information regarding request and ALuane Rumps, NP on call.  Continue to monitor.  2015 Called Rockland Churn (daughter) for more information regarding request to not give [name] narcotic pain medication.  2130 Rockland Churn (daughter) called Furman Jobs, Charity fundraiser.  Mrs. Bartle stated that she did not want her mother, [name], receiving narcotics.  Mrs. Bartle was on Fentanyl  pain patches for 20 years and hydrocodone  for Arthritis.  [name] is no longer taking narcotic pain medications and is currently taking Tylenol  for pain control at home.  2[address]. Lorre Rosin, NP contacted in regard to the request to discontinue narcotic pain medications per [facility].  Lidocaine  Patches requested by Furman Jobs, RN for pain control.

## 2024-01-03 NOTE — Plan of Care (Signed)
   Problem: Fluid Volume: Goal: Hemodynamic stability will improve Outcome: Progressing

## 2024-01-04 DIAGNOSIS — N12 Tubulo-interstitial nephritis, not specified as acute or chronic: Secondary | ICD-10-CM | POA: Diagnosis not present

## 2024-01-04 LAB — BASIC METABOLIC PANEL WITH GFR
Anion gap: 9 (ref 5–15)
BUN: 20 mg/dL (ref 8–23)
CO2: 17 mmol/L — ABNORMAL LOW (ref 22–32)
Calcium: 9.5 mg/dL (ref 8.9–10.3)
Chloride: 105 mmol/L (ref 98–111)
Creatinine, Ser: 1.41 mg/dL — ABNORMAL HIGH (ref 0.44–1.00)
GFR, Estimated: 38 mL/min — ABNORMAL LOW (ref >60)
Glucose, Bld: 89 mg/dL (ref 70–99)
Potassium: 3.9 mmol/L (ref 3.5–5.1)
Sodium: 131 mmol/L — ABNORMAL LOW (ref 135–145)

## 2024-01-04 LAB — BLOOD CULTURE ID PANEL (REFLEXED) - BCID2

## 2024-01-04 LAB — CBC
HCT: 35.9 % — ABNORMAL LOW (ref 36.0–46.0)
Hemoglobin: 10.6 g/dL — ABNORMAL LOW (ref 12.0–15.0)
MCH: 28 pg (ref 26.0–34.0)
MCHC: 29.5 g/dL — ABNORMAL LOW (ref 30.0–36.0)
MCV: 94.7 fL (ref 80.0–100.0)
Platelets: 234 10*3/uL (ref 150–400)
RBC: 3.79 MIL/uL — ABNORMAL LOW (ref 3.87–5.11)
RDW: 14.2 % (ref 11.5–15.5)
WBC: 7.8 10*3/uL (ref 4.0–10.5)
nRBC: 0 % (ref 0.0–0.2)

## 2024-01-04 MED ORDER — SODIUM CHLORIDE 0.9 % IV SOLN: INTRAVENOUS | Status: DC

## 2024-01-04 NOTE — Progress Notes (Signed)
 PHARMACY - PHYSICIAN COMMUNICATION CRITICAL VALUE ALERT - BLOOD CULTURE IDENTIFICATION (BCID)  [name] is an 78 y.o. female who presented to [facility] on [facility]/02/2024 with a chief complaint of  abdominal pain, fever, nausea, vomiting, and diarrhea.    Assessment:  1/3 staph species  Name of physician (or Provider) Contacted: Lorre Rosin  Current antibiotics: CTX  Changes to prescribed antibiotics recommended:  None, probable contaminant  Results for orders placed or performed during the hospital encounter of 01/02/24  Blood Culture ID Panel (Reflexed) (Collected: 01/03/2024 12:21 AM)  Result Value Ref Range   Enterococcus faecalis NOT DETECTED NOT DETECTED   Enterococcus Faecium NOT DETECTED NOT DETECTED   Listeria monocytogenes NOT DETECTED NOT DETECTED   Staphylococcus species DETECTED (A) NOT DETECTED   Staphylococcus aureus (BCID) NOT DETECTED NOT DETECTED   Staphylococcus epidermidis NOT DETECTED NOT DETECTED   Staphylococcus lugdunensis NOT DETECTED NOT DETECTED   Streptococcus species NOT DETECTED NOT DETECTED   Streptococcus agalactiae NOT DETECTED NOT DETECTED   Streptococcus pneumoniae NOT DETECTED NOT DETECTED   Streptococcus pyogenes NOT DETECTED NOT DETECTED   A.calcoaceticus-baumannii NOT DETECTED NOT DETECTED   Bacteroides fragilis NOT DETECTED NOT DETECTED   Enterobacterales NOT DETECTED NOT DETECTED   Enterobacter cloacae complex NOT DETECTED NOT DETECTED   Escherichia coli NOT DETECTED NOT DETECTED   Klebsiella aerogenes NOT DETECTED NOT DETECTED   Klebsiella oxytoca NOT DETECTED NOT DETECTED   Klebsiella pneumoniae NOT DETECTED NOT DETECTED   Proteus species NOT DETECTED NOT DETECTED   Salmonella species NOT DETECTED NOT DETECTED   Serratia marcescens NOT DETECTED NOT DETECTED   Haemophilus influenzae NOT DETECTED NOT DETECTED   Neisseria meningitidis NOT DETECTED NOT DETECTED   Pseudomonas aeruginosa NOT DETECTED NOT DETECTED   Stenotrophomonas  maltophilia NOT DETECTED NOT DETECTED   Candida albicans NOT DETECTED NOT DETECTED   Candida auris NOT DETECTED NOT DETECTED   Candida glabrata NOT DETECTED NOT DETECTED   Candida krusei NOT DETECTED NOT DETECTED   Candida parapsilosis NOT DETECTED NOT DETECTED   Candida tropicalis NOT DETECTED NOT DETECTED   Cryptococcus neoformans/gattii NOT DETECTED NOT DETECTED    Beau Bound RPh 01/04/2024, 1:45 AM

## 2024-01-04 NOTE — Plan of Care (Signed)
  Problem: Clinical Measurements: Goal: Signs and symptoms of infection will decrease Outcome: Progressing   Problem: Pain Managment: Goal: General experience of comfort will improve and/or be controlled Outcome: Progressing   Problem: Safety: Goal: Ability to remain free from injury will improve Outcome: Progressing

## 2024-01-04 NOTE — Progress Notes (Signed)
 PROGRESS NOTE    [name]  WNU:[id number] DOB: 06-19-1946 DOA: 01/02/2024 PCP: Lonzie Robins, MD    Brief Narrative:  [name] is a 78 y.o. female with medical history significant for CKD 3A, history of CVA, atrial fibrillation on Eliquis , depression, anxiety, hypothyroidism, recent treatment for C. difficile colitis, and recurrent UTIs who presents with abdominal pain, fever, nausea, vomiting, and diarrhea.   Patient reports that her diarrhea improved significantly with treatment of recurrent C. difficile last month, but she began to feel poorly again on 12/28/2023.  Since then, she has had 8-10 episodes of diarrhea daily, nausea, vomiting, abdominal pain, dysuria, fevers, and fatigue.   Assessment and Plan:    Pyelonephritis; asymmetric bladder wall thickening  - Continue Rocephin  -follow culture-- proteus/klebsiella  - follow-up with urology after discharge  for  Nonemergent CT urogram (with metallic artifact reduction technique) should also be considered to exclude underlying neoplasm   AKI superimposed on CKD 3A  - continue IVF and recheck in AM   Atrial fibrillation  - Eliquis , bisoprolol       Hx of CVA  - Eliquis , Lipitor      Non-small cell lung cancer   - S/p SBRT to RML nodule in July 2024  - Due for follow-up with Dr. Marguerita Shih     Diarrhea  Recent C. difficile infection - Reached out to ID and plan will be to treat with oral vancomycin  125 twice daily while on antibiotics and for 7 days after completion for prevention   Hypothyroidism  - Synthroid    Hypokalemia - Repleted   Home in AM if culture back, labs stable and afebrile Has been getting OBB  DVT prophylaxis:  apixaban  (ELIQUIS ) tablet 5 mg    Code Status: Full Code Family Communication: Husband at bedside  Disposition Plan:  Level of care: Telemetry Status is: Inpatient     Consultants:  ID (phone)   Subjective:  Stools have lessened in number and  solidified  Objective: Vitals:   01/03/24 2200 01/04/24 0000 01/04/24 0104 01/04/24 0524  BP: (!) 87/53 (!) 89/56 (!) 95/59 108/61  Pulse: 76 75 79 93  Resp: 18 16  17   Temp: 99.4 F (37.4 C) 99.3 F (37.4 C)  (!) 100.5 F (38.1 C)  TempSrc: Oral Oral  Oral  SpO2: 98% 98%  93%  Weight:      Height:        Intake/Output Summary (Last 24 hours) at 01/04/2024 1200 Last data filed at 01/03/2024 2100 Gross per 24 hour  Intake 320 ml  Output --  Net 320 ml   Filed Weights   01/02/24 1750  Weight: 58.1 kg    Examination:   General: Appearance:    Well developed, well nourished female in no acute distress     Lungs:     respirations unlabored  Heart:    Normal heart rate.    MS:   All extremities are intact.    Neurologic:   Awake, alert, oriented x 3. No apparent focal neurological           defect.        Data Reviewed: I have personally reviewed following labs and imaging studies  CBC: Recent Labs  Lab 01/02/24 1900 01/03/24 0021 01/04/24 0448  WBC 13.3* 11.5* 7.8  NEUTROABS 11.5*  --   --   HGB 12.0 10.6* 10.6*  HCT 38.2 35.7* 35.9*  MCV 88.6 93.7 94.7  PLT 281 257 234   Basic Metabolic  Panel: Recent Labs  Lab 01/02/24 1900 01/03/24 0021 01/04/24 0448  NA 136 130* 131*  K 3.9 2.9* 3.9  CL 101 99 105  CO2 24 20* 17*  GLUCOSE 98 130* 89  BUN 25* 25* 20  CREATININE 1.64* 1.30* 1.41*  CALCIUM  10.3 9.2 9.5  MG  --  1.8  --    GFR: Estimated Creatinine Clearance: 30.2 mL/min (A) (by C-G formula based on SCr of 1.41 mg/dL (H)). Liver Function Tests: Recent Labs  Lab 01/02/24 1900  AST 21  ALT 17  ALKPHOS 86  BILITOT 1.1  PROT 7.3  ALBUMIN 3.3*   Recent Labs  Lab 01/02/24 1900  LIPASE 25   No results for input(s): "AMMONIA" in the last 168 hours. Coagulation Profile: Recent Labs  Lab 01/03/24 0021  INR 1.5*   Cardiac Enzymes: No results for input(s): "CKTOTAL", "CKMB", "CKMBINDEX", "TROPONINI" in the last 168 hours. BNP (last 3  results) No results for input(s): "PROBNP" in the last 8760 hours. HbA1C: No results for input(s): "HGBA1C" in the last 72 hours. CBG: No results for input(s): "GLUCAP" in the last 168 hours. Lipid Profile: No results for input(s): "CHOL", "HDL", "LDLCALC", "TRIG", "CHOLHDL", "LDLDIRECT" in the last 72 hours. Thyroid  Function Tests: No results for input(s): "TSH", "T4TOTAL", "FREET4", "T3FREE", "THYROIDAB" in the last 72 hours. Anemia Panel: No results for input(s): "VITAMINB12", "FOLATE", "FERRITIN", "TIBC", "IRON", "RETICCTPCT" in the last 72 hours. Sepsis Labs: Recent Labs  Lab 01/02/24 1939  LATICACIDVEN 1.7    Recent Results (from the past 240 hours)  Urine Culture     Status: Abnormal (Preliminary result)   Collection Time: 01/02/24  7:39 PM   Specimen: Urine, Clean Catch  Result Value Ref Range Status   Specimen Description   Final    URINE, CLEAN CATCH Performed at [facility], [address] [place], [place] [address]    Special Requests   Final    NONE Performed at [facility], [address], [place], [place] [address]    Culture (A)  Final    >=100,000 COLONIES/mL PROTEUS MIRABILIS 80,000 COLONIES/mL KLEBSIELLA PNEUMONIAE    Report Status PENDING  Incomplete  Resp panel by RT-PCR (RSV, Flu A&B, Covid) Anterior Nasal Swab     Status: None   Collection Time: 01/02/24 11:42 PM   Specimen: Anterior Nasal Swab  Result Value Ref Range Status   SARS Coronavirus 2 by RT PCR NEGATIVE NEGATIVE Final    Comment: (NOTE) SARS-CoV-2 target nucleic acids are NOT DETECTED.  The SARS-CoV-2 RNA is generally detectable in upper respiratory specimens during the acute phase of infection. The lowest concentration of SARS-CoV-2 viral copies this assay can detect is 138 copies/mL. A negative result does not preclude SARS-Cov-2 infection and should not be used as the sole basis for treatment or other patient management decisions. A  negative result may occur with  improper specimen collection/handling, submission of specimen other than nasopharyngeal swab, presence of viral mutation(s) within the areas targeted by this assay, and inadequate number of viral copies(<138 copies/mL). A negative result must be combined with clinical observations, patient history, and epidemiological information. The expected result is Negative.  Fact Sheet for Patients:  BloggerCourse.com  Fact Sheet for Healthcare Providers:  SeriousBroker.it  This test is no t yet approved or cleared by the [place]  FDA and  has been authorized for detection and/or diagnosis of SARS-CoV-2 by FDA under an Emergency Use Authorization (EUA). This EUA will remain  in effect (  meaning this test can be used) for the duration of the COVID-19 declaration under Section 564(b)(1) of the Act, 21 U.S.C.section 360bbb-3(b)(1), unless the authorization is terminated  or revoked sooner.       Influenza A by PCR NEGATIVE NEGATIVE Final   Influenza B by PCR NEGATIVE NEGATIVE Final    Comment: (NOTE) The Xpert Xpress SARS-CoV-2/FLU/RSV plus assay is intended as an aid in the diagnosis of influenza from Nasopharyngeal swab specimens and should not be used as a sole basis for treatment. Nasal washings and aspirates are unacceptable for Xpert Xpress SARS-CoV-2/FLU/RSV testing.  Fact Sheet for Patients: BloggerCourse.com  Fact Sheet for Healthcare Providers: SeriousBroker.it  This test is not yet approved or cleared by the [place]  FDA and has been authorized for detection and/or diagnosis of SARS-CoV-2 by FDA under an Emergency Use Authorization (EUA). This EUA will remain in effect (meaning this test can be used) for the duration of the COVID-19 declaration under Section 564(b)(1) of the Act, 21 U.S.C. section 360bbb-3(b)(1), unless the authorization  is terminated or revoked.     Resp Syncytial Virus by PCR NEGATIVE NEGATIVE Final    Comment: (NOTE) Fact Sheet for Patients: BloggerCourse.com  Fact Sheet for Healthcare Providers: SeriousBroker.it  This test is not yet approved or cleared by the [place]  FDA and has been authorized for detection and/or diagnosis of SARS-CoV-2 by FDA under an Emergency Use Authorization (EUA). This EUA will remain in effect (meaning this test can be used) for the duration of the COVID-19 declaration under Section 564(b)(1) of the Act, 21 U.S.C. section 360bbb-3(b)(1), unless the authorization is terminated or revoked.  Performed at [facility], [address], [place], [place] [address]   Blood Culture (routine x 2)     Status: None (Preliminary result)   Collection Time: 01/03/24 12:21 AM   Specimen: BLOOD  Result Value Ref Range Status   Specimen Description   Final    BLOOD LEFT ANTECUBITAL Performed at [facility], [address], [place], [place] [address]    Special Requests   Final    BOTTLES DRAWN AEROBIC AND ANAEROBIC Blood Culture adequate volume Performed at [facility], [address] [place], [place] [address]    Culture   Final    NO GROWTH 1 DAY Performed at [facility], [address] [place], [place] [address]    Report Status PENDING  Incomplete  Blood Culture (routine x 2)     Status: None (Preliminary result)   Collection Time: 01/03/24 12:21 AM   Specimen: BLOOD RIGHT ARM  Result Value Ref Range Status   Specimen Description   Final    BLOOD RIGHT ARM Performed at [facility], [address], [place], [place] [address]    Special Requests   Final    BOTTLES DRAWN AEROBIC ONLY Blood Culture results may not be optimal due to an inadequate volume of blood received in culture bottles Performed at [facility], [address] [place], [place] [address]    Culture  Setup Time   Final    GRAM POSITIVE COCCI AEROBIC BOTTLE ONLY CRITICAL RESULT CALLED TO, READ BACK BY AND VERIFIED WITH: PHARMD E JACKSON 01/04/2024 @ 0134 BY AB Performed at [facility], [address], [place], [place] [address]    Culture [facility] POSITIVE COCCI  Final   Report Status PENDING  Incomplete  Blood Culture ID Panel (Reflexed)  Status: Abnormal   Collection Time: 01/03/24 12:21 AM  Result Value Ref Range Status   Enterococcus faecalis NOT DETECTED NOT DETECTED Final   Enterococcus Faecium NOT DETECTED NOT DETECTED Final   Listeria monocytogenes NOT DETECTED NOT DETECTED Final   Staphylococcus species DETECTED (A) NOT DETECTED Final    Comment: CRITICAL RESULT CALLED TO, READ BACK BY AND VERIFIED WITH: PHARMD E JACKSON 01/04/2024 @ 0134 BY AB    Staphylococcus aureus (BCID) NOT DETECTED NOT DETECTED Final   Staphylococcus epidermidis NOT DETECTED NOT DETECTED Final   Staphylococcus lugdunensis NOT DETECTED NOT DETECTED Final   Streptococcus species NOT DETECTED NOT DETECTED Final   Streptococcus agalactiae NOT DETECTED NOT DETECTED Final   Streptococcus pneumoniae NOT DETECTED NOT DETECTED Final   Streptococcus pyogenes NOT DETECTED NOT DETECTED Final   A.calcoaceticus-baumannii NOT DETECTED NOT DETECTED Final   Bacteroides fragilis NOT DETECTED NOT DETECTED Final   Enterobacterales NOT DETECTED NOT DETECTED Final   Enterobacter cloacae complex NOT DETECTED NOT DETECTED Final   Escherichia coli NOT DETECTED NOT DETECTED Final   Klebsiella aerogenes NOT DETECTED NOT DETECTED Final   Klebsiella oxytoca NOT DETECTED NOT DETECTED Final   Klebsiella pneumoniae NOT DETECTED NOT DETECTED Final   Proteus species NOT DETECTED NOT DETECTED Final   Salmonella species NOT DETECTED NOT DETECTED Final   Serratia marcescens NOT DETECTED NOT DETECTED Final   Haemophilus influenzae NOT DETECTED NOT DETECTED Final   Neisseria  meningitidis NOT DETECTED NOT DETECTED Final   Pseudomonas aeruginosa NOT DETECTED NOT DETECTED Final   Stenotrophomonas maltophilia NOT DETECTED NOT DETECTED Final   Candida albicans NOT DETECTED NOT DETECTED Final   Candida auris NOT DETECTED NOT DETECTED Final   Candida glabrata NOT DETECTED NOT DETECTED Final   Candida krusei NOT DETECTED NOT DETECTED Final   Candida parapsilosis NOT DETECTED NOT DETECTED Final   Candida tropicalis NOT DETECTED NOT DETECTED Final   Cryptococcus neoformans/gattii NOT DETECTED NOT DETECTED Final    Comment: Performed at [facility], [address] [place], [place] [address]         Radiology Studies: DG Chest Port 1 View Result Date: 01/02/2024 CLINICAL DATA:  Sepsis EXAM: PORTABLE CHEST 1 VIEW COMPARISON:  02/20/2023 FINDINGS: Focal consolidation within the right mid lung zone is present, similar to that noted on CT examination of 12/21/2023. No pneumothorax or pleural effusion. Cardiac size is mildly enlarged, unchanged. Pulmonary vascularity is normal. No acute bone abnormality. IMPRESSION: 1. Focal consolidation within the right mid lung zone, similar to that noted on CT examination of 12/21/2023. Electronically Signed   By: Worthy Heads M.D.   On: 01/02/2024 23:10   CT ABDOMEN PELVIS W CONTRAST Result Date: 01/02/2024 CLINICAL DATA:  Abdominal pain, acute, nonlocalized EXAM: CT ABDOMEN AND PELVIS WITH CONTRAST TECHNIQUE: Multidetector CT imaging of the abdomen and pelvis was performed using the standard protocol following bolus administration of intravenous contrast. RADIATION DOSE REDUCTION: This exam was performed according to the departmental dose-optimization program which includes automated exposure control, adjustment of the mA and/or kV according to patient size and/or use of iterative reconstruction technique. CONTRAST:  80mL OMNIPAQUE  IOHEXOL  300 MG/ML  SOLN COMPARISON:  May 22, 2023, February 11, 2019 FINDINGS: Lower chest: No focal  airspace consolidation or pleural effusion.Mild cardiomegaly with right atrial predominance. Hepatobiliary: Multiple small cysts scattered throughout both lobes of the liver. A large lesion in the left hepatic lobe with chunky calcification, measuring 5 cm, is unchanged, likely a hemangioma given the stability.  Subtle arterially enhancing lesion in the posterior right hepatic lobe (axial 19), also likely a small flash filling hemangioma. Cholecystectomy. Mild dilation of the intrahepatic and extrahepatic bile ducts, likely related to the prior cholecystectomy. The portal veins are patent. Pancreas: No mass. Unchanged prominence of the main pancreatic duct, possibly age-related ectasia. No peripancreatic inflammation or fluid collection. Spleen: Normal size. No mass. Adrenals/Urinary Tract: No adrenal masses. The right kidney is not visualized. No left renal mass. Moderate left-sided hydroureteronephrosis. No visualized ureteral calculus. Mild left-sided perinephric stranding. Asymmetric wall thickening along the posterior bladder. Stomach/Bowel: Moderate-sized sliding-type hiatal hernia. The stomach is decompressed without focal abnormality. No small bowel wall thickening or inflammation. No small bowel obstruction.The appendix was not visualized. No right lower quadrant or pericecal inflammatory changes to suggest acute appendicitis. Vascular/Lymphatic: No aortic aneurysm. Diffuse aortoiliac atherosclerosis. No intraabdominal or pelvic lymphadenopathy. Reproductive: Hysterectomy. No concerning adnexal mass.no free pelvic fluid. Other: No pneumoperitoneum or ascites Musculoskeletal: No acute fracture or destructive lesion. Osteopenia. Right hip arthroplasty is anatomically aligned without dislocation. Lumbar fusion hardware at L3-L4, unchanged. Multilevel degenerative disc disease of the spine. Moderate left hip osteoarthritis. IMPRESSION: 1. Moderate left-sided hydroureteronephrosis without obstructive ureteral  calculus visualized. Given the left perinephric stranding, this may represent changes from a recently passed calculus or an ascending urinary tract infection. Correlation with urinalysis recommended. 2. Asymmetric wall thickening along the posterior aspect of the urinary bladder, possibly due to acute cystitis. Nonemergent CT urogram (with metallic artifact reduction technique) should also be considered to exclude underlying neoplasm. 3. Moderate-sized hiatal hernia. Electronically Signed   By: Rance Burrows M.D.   On: 01/02/2024 21:57        Scheduled Meds:  acidophilus  2 capsule Oral Daily   apixaban   5 mg Oral BID   atorvastatin   10 mg Oral QHS   bisoprolol   2.5 mg Oral Daily   buPROPion   75 mg Oral q morning   feeding supplement  237 mL Oral BID BM   FLUoxetine   20 mg Oral QHS   gabapentin   600 mg Oral BID   levothyroxine   88 mcg Oral Q0600   lidocaine   1-2 patch Transdermal Q24H   pantoprazole   40 mg Oral BID AC   sodium chloride  flush  3 mL Intravenous Q12H   vancomycin   125 mg Oral BID   Continuous Infusions:  sodium chloride  75 mL/hr at 01/04/24 1154   cefTRIAXone  (ROCEPHIN )  IV 2 g (01/03/24 2228)     LOS: 2 days    Time spent: 45 minutes spent on chart review, discussion with nursing staff, consultants, updating family and interview/physical exam; more than 50% of that time was spent in counseling and/or coordination of care.    Enrigue Harvard, DO [facility] Available via Epic secure chat 7am-7pm After these hours, please refer to coverage provider listed on [email or web address] 01/04/2024, 12:00 PM

## 2024-01-05 DIAGNOSIS — N12 Tubulo-interstitial nephritis, not specified as acute or chronic: Secondary | ICD-10-CM | POA: Diagnosis not present

## 2024-01-05 LAB — CULTURE, BLOOD (ROUTINE X 2)

## 2024-01-05 LAB — BASIC METABOLIC PANEL WITH GFR
Anion gap: 9 (ref 5–15)
BUN: 16 mg/dL (ref 8–23)
CO2: 20 mmol/L — ABNORMAL LOW (ref 22–32)
Calcium: 9.9 mg/dL (ref 8.9–10.3)
Chloride: 109 mmol/L (ref 98–111)
Creatinine, Ser: 1.34 mg/dL — ABNORMAL HIGH (ref 0.44–1.00)
GFR, Estimated: 41 mL/min — ABNORMAL LOW (ref >60)
Glucose, Bld: 97 mg/dL (ref 70–99)
Potassium: 3.8 mmol/L (ref 3.5–5.1)
Sodium: 138 mmol/L (ref 135–145)

## 2024-01-05 LAB — CBC
HCT: 38.6 % (ref 36.0–46.0)
Hemoglobin: 11.9 g/dL — ABNORMAL LOW (ref 12.0–15.0)
MCH: 28 pg (ref 26.0–34.0)
MCHC: 30.8 g/dL (ref 30.0–36.0)
MCV: 90.8 fL (ref 80.0–100.0)
Platelets: 257 10*3/uL (ref 150–400)
RBC: 4.25 MIL/uL (ref 3.87–5.11)
RDW: 14.5 % (ref 11.5–15.5)
WBC: 5.3 10*3/uL (ref 4.0–10.5)
nRBC: 0 % (ref 0.0–0.2)

## 2024-01-05 LAB — URINE CULTURE: Culture: >=100000 — AB

## 2024-01-05 MED ORDER — CEPHALEXIN 500 MG PO CAPS
500.0000 mg | ORAL_CAPSULE | Freq: Three times a day (TID) | ORAL | Status: DC
  Administered 2024-01-05 – 2024-01-08 (×9): 500 mg via ORAL
  Filled 2024-01-05 (×9): qty 1

## 2024-01-05 MED ORDER — CIPROFLOXACIN HCL 500 MG PO TABS: 500.0000 mg | ORAL_TABLET | Freq: Two times a day (BID) | ORAL | Status: DC

## 2024-01-05 NOTE — Progress Notes (Signed)
 PROGRESS NOTE    [name]  WUJ:[id number] DOB: 02-06-1946 DOA: 01/02/2024 PCP: Lonzie Robins, MD    Brief Narrative:  [name] is a 78 y.o. female with medical history significant for CKD 3A, history of CVA, atrial fibrillation on Eliquis , depression, anxiety, hypothyroidism, recent treatment for C. difficile colitis, and recurrent UTIs who presents with abdominal pain, fever, nausea, vomiting, and diarrhea.   Patient reports that her diarrhea improved significantly with treatment of recurrent C. difficile last month, but she began to feel poorly again on 12/28/2023.  Since then, she has had 8-10 episodes of diarrhea daily, nausea, vomiting, abdominal pain, dysuria, fevers, and fatigue.  On the morning of 01/05/2024 the patient is visiting with her daughter who is at bedside. She states that she is not able to eat much due to continued issues with pain in her abdomen. She does state that her stools are decreasing in number, but are still somewhat sudden. She and her daughter fear that if she goes home today, she will be right back. They are receptive to leaving tomorrow.  I will transition the patient to cipro  to prepare for discharge tomorrow. She is allergic to penicillin.   Assessment and Plan:    Pyelonephritis; asymmetric bladder wall thickening  Will transition to cipro  in preparation for discharge to home.  She is allergic to penicillin, so no augmentin. She will need to continue PO vanc until course of antibiotics for pyelonephritis has completed. -follow culture-- proteus/klebsiella  - follow-up with urology after discharge  for  Nonemergent CT urogram (with metallic artifact reduction technique) should also be considered to exclude underlying neoplasm   AKI superimposed on CKD 3A  - Fopllow creatinine, electrolytes, and volume status. - High volume diarrhea has resolved. Will stop IV fluids. - the patient's creatinine has improved to 1.34. it appears that after  November of 2024, this has been roughly her baseline. Prior to that it was more between 1.04 and 1.18.    Atrial fibrillation  - Eliquis , bisoprolol    Hear rate is controlled.    Hx of CVA  - Eliquis , Lipitor      Non-small cell lung cancer   - S/p SBRT to RML nodule in July 2024  - Due for follow-up with Dr. Marguerita Shih     Diarrhea  Recent C. difficile infection. --Improving. - Reached out to ID and plan will be to treat with oral vancomycin  125 twice daily while on antibiotics and for 7 days after completion for prevention   Hypothyroidism  - Synthroid    Hypokalemia - Resoplved.   Home in AM if culture back, labs stable and afebrile Has been getting OBB  DVT prophylaxis:  apixaban  (ELIQUIS ) tablet 5 mg    Code Status: Full Code Family Communication: Husband at bedside  Disposition Plan:  Level of care: Med-Surg Status is: Inpatient     Consultants:  ID (phone)   Subjective:  Stools have lessened in number and solidified  Objective: Vitals:   01/04/24 0524 01/04/24 1248 01/04/24 2236 01/05/24 0543  BP: 108/61 103/79 99/60 (!) 128/92  Pulse: 93 91 71 79  Resp: 17 20 16 18   Temp: (!) 100.5 F (38.1 C) 98.8 F (37.1 C) 98.5 F (36.9 C) 98.9 F (37.2 C)  TempSrc: Oral Oral Oral Oral  SpO2: 93% 95% 99% 99%  Weight:      Height:        Intake/Output Summary (Last 24 hours) at 01/05/2024 1156 Last data filed at 01/05/2024 1124  Gross per 24 hour  Intake 2129.06 ml  Output --  Net 2129.06 ml   Filed Weights   01/02/24 1750  Weight: 58.1 kg    Examination:   Exam:  Constitutional:  The patient is awake, alert, and oriented x 3. No acute distress. Respiratory:  No increased work of breathing. No wheezes, rales, or rhonchi No tactile fremitus Cardiovascular:  Regular rate and rhythm No murmurs, ectopy, or gallups. No lateral PMI. No thrills. Abdomen:  Abdomen is soft, slightly distended, and diffusely tender. No hernias, masses, or  organomegaly Normoactive bowel sounds.  Musculoskeletal:  No cyanosis, clubbing, or edema Skin:  No rashes, lesions, ulcers palpation of skin: no induration or nodules Neurologic:  CN 2-12 intact Sensation all 4 extremities intact Psychiatric:  Mental status Mood, affect appropriate Orientation to person, place, time  judgment and insight appear intact  Data Reviewed: I have personally reviewed following labs and imaging studies  CBC: Recent Labs  Lab 01/02/24 1900 01/03/24 0021 01/04/24 0448 01/05/24 0619  WBC 13.3* 11.5* 7.8 5.3  NEUTROABS 11.5*  --   --   --   HGB 12.0 10.6* 10.6* 11.9*  HCT 38.2 35.7* 35.9* 38.6  MCV 88.6 93.7 94.7 90.8  PLT 281 257 234 257   Basic Metabolic Panel: Recent Labs  Lab 01/02/24 1900 01/03/24 0021 01/04/24 0448 01/05/24 0619  NA 136 130* 131* 138  K 3.9 2.9* 3.9 3.8  CL 101 99 105 109  CO2 24 20* 17* 20*  GLUCOSE 98 130* 89 97  BUN 25* 25* 20 16  CREATININE 1.64* 1.30* 1.41* 1.34*  CALCIUM  10.3 9.2 9.5 9.9  MG  --  1.8  --   --    GFR: Estimated Creatinine Clearance: 31.7 mL/min (A) (by C-G formula based on SCr of 1.34 mg/dL (H)). Liver Function Tests: Recent Labs  Lab 01/02/24 1900  AST 21  ALT 17  ALKPHOS 86  BILITOT 1.1  PROT 7.3  ALBUMIN 3.3*   Recent Labs  Lab 01/02/24 1900  LIPASE 25   No results for input(s): "AMMONIA" in the last 168 hours. Coagulation Profile: Recent Labs  Lab 01/03/24 0021  INR 1.5*   Cardiac Enzymes: No results for input(s): "CKTOTAL", "CKMB", "CKMBINDEX", "TROPONINI" in the last 168 hours. BNP (last 3 results) No results for input(s): "PROBNP" in the last 8760 hours. HbA1C: No results for input(s): "HGBA1C" in the last 72 hours. CBG: No results for input(s): "GLUCAP" in the last 168 hours. Lipid Profile: No results for input(s): "CHOL", "HDL", "LDLCALC", "TRIG", "CHOLHDL", "LDLDIRECT" in the last 72 hours. Thyroid  Function Tests: No results for input(s): "TSH", "T4TOTAL",  "FREET4", "T3FREE", "THYROIDAB" in the last 72 hours. Anemia Panel: No results for input(s): "VITAMINB12", "FOLATE", "FERRITIN", "TIBC", "IRON", "RETICCTPCT" in the last 72 hours. Sepsis Labs: Recent Labs  Lab 01/02/24 1939  LATICACIDVEN 1.7    Recent Results (from the past 240 hours)  Urine Culture     Status: Abnormal   Collection Time: 01/02/24  7:39 PM   Specimen: Urine, Clean Catch  Result Value Ref Range Status   Specimen Description   Final    URINE, CLEAN CATCH Performed at [facility], [address] [place], [place] [address]    Special Requests   Final    NONE Performed at [facility], [address], [place], [place] [address]    Culture (A)  Final    >=100,000 COLONIES/mL PROTEUS MIRABILIS 80,000 COLONIES/mL KLEBSIELLA PNEUMONIAE  Report Status 01/05/2024 FINAL  Final   Organism ID, Bacteria PROTEUS MIRABILIS (A)  Final   Organism ID, Bacteria KLEBSIELLA PNEUMONIAE (A)  Final      Susceptibility   Klebsiella pneumoniae - MIC*    AMPICILLIN >=32 RESISTANT Resistant     CEFAZOLIN <=4 SENSITIVE Sensitive     CEFEPIME  <=0.12 SENSITIVE Sensitive     CEFTRIAXONE  <=0.25 SENSITIVE Sensitive     CIPROFLOXACIN  <=0.25 SENSITIVE Sensitive     GENTAMICIN <=1 SENSITIVE Sensitive     IMIPENEM <=0.25 SENSITIVE Sensitive     NITROFURANTOIN 32 SENSITIVE Sensitive     TRIMETH/SULFA <=20 SENSITIVE Sensitive     AMPICILLIN/SULBACTAM 4 SENSITIVE Sensitive     PIP/TAZO <=4 SENSITIVE Sensitive ug/mL    * 80,000 COLONIES/mL KLEBSIELLA PNEUMONIAE   Proteus mirabilis - MIC*    AMPICILLIN <=2 SENSITIVE Sensitive     CEFAZOLIN <=4 SENSITIVE Sensitive     CEFEPIME  <=0.12 SENSITIVE Sensitive     CEFTRIAXONE  <=0.25 SENSITIVE Sensitive     CIPROFLOXACIN  <=0.25 SENSITIVE Sensitive     GENTAMICIN <=1 SENSITIVE Sensitive     IMIPENEM 2 SENSITIVE Sensitive     NITROFURANTOIN 128 RESISTANT Resistant     TRIMETH/SULFA <=20 SENSITIVE Sensitive      AMPICILLIN/SULBACTAM <=2 SENSITIVE Sensitive     PIP/TAZO <=4 SENSITIVE Sensitive ug/mL    * >=100,000 COLONIES/mL PROTEUS MIRABILIS  Resp panel by RT-PCR (RSV, Flu A&B, Covid) Anterior Nasal Swab     Status: None   Collection Time: 01/02/24 11:42 PM   Specimen: Anterior Nasal Swab  Result Value Ref Range Status   SARS Coronavirus 2 by RT PCR NEGATIVE NEGATIVE Final    Comment: (NOTE) SARS-CoV-2 target nucleic acids are NOT DETECTED.  The SARS-CoV-2 RNA is generally detectable in upper respiratory specimens during the acute phase of infection. The lowest concentration of SARS-CoV-2 viral copies this assay can detect is 138 copies/mL. A negative result does not preclude SARS-Cov-2 infection and should not be used as the sole basis for treatment or other patient management decisions. A negative result may occur with  improper specimen collection/handling, submission of specimen other than nasopharyngeal swab, presence of viral mutation(s) within the areas targeted by this assay, and inadequate number of viral copies(<138 copies/mL). A negative result must be combined with clinical observations, patient history, and epidemiological information. The expected result is Negative.  Fact Sheet for Patients:  BloggerCourse.com  Fact Sheet for Healthcare Providers:  SeriousBroker.it  This test is no t yet approved or cleared by the [place]  FDA and  has been authorized for detection and/or diagnosis of SARS-CoV-2 by FDA under an Emergency Use Authorization (EUA). This EUA will remain  in effect (meaning this test can be used) for the duration of the COVID-19 declaration under Section 564(b)(1) of the Act, 21 U.S.C.section 360bbb-3(b)(1), unless the authorization is terminated  or revoked sooner.       Influenza A by PCR NEGATIVE NEGATIVE Final   Influenza B by PCR NEGATIVE NEGATIVE Final    Comment: (NOTE) The Xpert Xpress  SARS-CoV-2/FLU/RSV plus assay is intended as an aid in the diagnosis of influenza from Nasopharyngeal swab specimens and should not be used as a sole basis for treatment. Nasal washings and aspirates are unacceptable for Xpert Xpress SARS-CoV-2/FLU/RSV testing.  Fact Sheet for Patients: BloggerCourse.com  Fact Sheet for Healthcare Providers: SeriousBroker.it  This test is not yet approved or cleared by the [place]  FDA and has been authorized for detection and/or diagnosis of SARS-CoV-2  by FDA under an Emergency Use Authorization (EUA). This EUA will remain in effect (meaning this test can be used) for the duration of the COVID-19 declaration under Section 564(b)(1) of the Act, 21 U.S.C. section 360bbb-3(b)(1), unless the authorization is terminated or revoked.     Resp Syncytial Virus by PCR NEGATIVE NEGATIVE Final    Comment: (NOTE) Fact Sheet for Patients: BloggerCourse.com  Fact Sheet for Healthcare Providers: SeriousBroker.it  This test is not yet approved or cleared by the [place]  FDA and has been authorized for detection and/or diagnosis of SARS-CoV-2 by FDA under an Emergency Use Authorization (EUA). This EUA will remain in effect (meaning this test can be used) for the duration of the COVID-19 declaration under Section 564(b)(1) of the Act, 21 U.S.C. section 360bbb-3(b)(1), unless the authorization is terminated or revoked.  Performed at [facility], [address] [place], [place] [address]   Blood Culture (routine x 2)     Status: None (Preliminary result)   Collection Time: 01/03/24 12:21 AM   Specimen: BLOOD  Result Value Ref Range Status   Specimen Description   Final    BLOOD LEFT ANTECUBITAL Performed at [facility], [address], [place], [place] [address]    Special Requests   Final    BOTTLES DRAWN  AEROBIC AND ANAEROBIC Blood Culture adequate volume Performed at [facility], [address], [place], [place] [address]    Culture   Final    NO GROWTH 2 DAYS Performed at [facility], [address] [place], [place] [address]    Report Status PENDING  Incomplete  Blood Culture (routine x 2)     Status: Abnormal   Collection Time: 01/03/24 12:21 AM   Specimen: BLOOD RIGHT ARM  Result Value Ref Range Status   Specimen Description   Final    BLOOD RIGHT ARM Performed at [facility], [address] [place], [place] [address]    Special Requests   Final    BOTTLES DRAWN AEROBIC ONLY Blood Culture results may not be optimal due to an inadequate volume of blood received in culture bottles Performed at [facility], [address], [place], [place] [address]    Culture  Setup Time   Final    GRAM POSITIVE COCCI AEROBIC BOTTLE ONLY CRITICAL RESULT CALLED TO, READ BACK BY AND VERIFIED WITH: PHARMD E JACKSON 01/04/2024 @ 0134 BY AB    Culture (A)  Final    STAPHYLOCOCCUS HOMINIS THE SIGNIFICANCE OF ISOLATING THIS ORGANISM FROM A SINGLE SET OF BLOOD CULTURES WHEN MULTIPLE SETS ARE DRAWN IS UNCERTAIN. PLEASE NOTIFY THE MICROBIOLOGY DEPARTMENT WITHIN ONE WEEK IF SPECIATION AND SENSITIVITIES ARE REQUIRED. Performed at [facility], [address] [place], [place] [address]    Report Status 01/05/2024 FINAL  Final  Blood Culture ID Panel (Reflexed)     Status: Abnormal   Collection Time: 01/03/24 12:21 AM  Result Value Ref Range Status   Enterococcus faecalis NOT DETECTED NOT DETECTED Final   Enterococcus Faecium NOT DETECTED NOT DETECTED Final   Listeria monocytogenes NOT DETECTED NOT DETECTED Final   Staphylococcus species DETECTED (A) NOT DETECTED Final    Comment: CRITICAL RESULT CALLED TO, READ BACK BY AND VERIFIED WITH: PHARMD E JACKSON 01/04/2024 @ 0134 BY AB    Staphylococcus aureus (BCID) NOT DETECTED NOT DETECTED Final    Staphylococcus epidermidis NOT DETECTED NOT DETECTED Final   Staphylococcus lugdunensis NOT DETECTED  NOT DETECTED Final   Streptococcus species NOT DETECTED NOT DETECTED Final   Streptococcus agalactiae NOT DETECTED NOT DETECTED Final   Streptococcus pneumoniae NOT DETECTED NOT DETECTED Final   Streptococcus pyogenes NOT DETECTED NOT DETECTED Final   A.calcoaceticus-baumannii NOT DETECTED NOT DETECTED Final   Bacteroides fragilis NOT DETECTED NOT DETECTED Final   Enterobacterales NOT DETECTED NOT DETECTED Final   Enterobacter cloacae complex NOT DETECTED NOT DETECTED Final   Escherichia coli NOT DETECTED NOT DETECTED Final   Klebsiella aerogenes NOT DETECTED NOT DETECTED Final   Klebsiella oxytoca NOT DETECTED NOT DETECTED Final   Klebsiella pneumoniae NOT DETECTED NOT DETECTED Final   Proteus species NOT DETECTED NOT DETECTED Final   Salmonella species NOT DETECTED NOT DETECTED Final   Serratia marcescens NOT DETECTED NOT DETECTED Final   Haemophilus influenzae NOT DETECTED NOT DETECTED Final   Neisseria meningitidis NOT DETECTED NOT DETECTED Final   Pseudomonas aeruginosa NOT DETECTED NOT DETECTED Final   Stenotrophomonas maltophilia NOT DETECTED NOT DETECTED Final   Candida albicans NOT DETECTED NOT DETECTED Final   Candida auris NOT DETECTED NOT DETECTED Final   Candida glabrata NOT DETECTED NOT DETECTED Final   Candida krusei NOT DETECTED NOT DETECTED Final   Candida parapsilosis NOT DETECTED NOT DETECTED Final   Candida tropicalis NOT DETECTED NOT DETECTED Final   Cryptococcus neoformans/gattii NOT DETECTED NOT DETECTED Final    Comment: Performed at [facility], [address] [place], [place] [address]         Radiology Studies: DG Chest Port 1 View Result Date: 01/02/2024 CLINICAL DATA:  Sepsis EXAM: PORTABLE CHEST 1 VIEW COMPARISON:  02/20/2023 FINDINGS: Focal consolidation within the right mid lung zone is present, similar to that noted on CT examination of  12/21/2023. No pneumothorax or pleural effusion. Cardiac size is mildly enlarged, unchanged. Pulmonary vascularity is normal. No acute bone abnormality. IMPRESSION: 1. Focal consolidation within the right mid lung zone, similar to that noted on CT examination of 12/21/2023. Electronically Signed   By: Worthy Heads M.D.   On: 01/02/2024 23:10   CT ABDOMEN PELVIS W CONTRAST Result Date: 01/02/2024 CLINICAL DATA:  Abdominal pain, acute, nonlocalized EXAM: CT ABDOMEN AND PELVIS WITH CONTRAST TECHNIQUE: Multidetector CT imaging of the abdomen and pelvis was performed using the standard protocol following bolus administration of intravenous contrast. RADIATION DOSE REDUCTION: This exam was performed according to the departmental dose-optimization program which includes automated exposure control, adjustment of the mA and/or kV according to patient size and/or use of iterative reconstruction technique. CONTRAST:  80mL OMNIPAQUE  IOHEXOL  300 MG/ML  SOLN COMPARISON:  May 22, 2023, February 11, 2019 FINDINGS: Lower chest: No focal airspace consolidation or pleural effusion.Mild cardiomegaly with right atrial predominance. Hepatobiliary: Multiple small cysts scattered throughout both lobes of the liver. A large lesion in the left hepatic lobe with chunky calcification, measuring 5 cm, is unchanged, likely a hemangioma given the stability. Subtle arterially enhancing lesion in the posterior right hepatic lobe (axial 19), also likely a small flash filling hemangioma. Cholecystectomy. Mild dilation of the intrahepatic and extrahepatic bile ducts, likely related to the prior cholecystectomy. The portal veins are patent. Pancreas: No mass. Unchanged prominence of the main pancreatic duct, possibly age-related ectasia. No peripancreatic inflammation or fluid collection. Spleen: Normal size. No mass. Adrenals/Urinary Tract: No adrenal masses. The right kidney is not visualized. No left renal mass. Moderate left-sided  hydroureteronephrosis. No visualized ureteral calculus. Mild left-sided perinephric stranding. Asymmetric wall thickening along the posterior bladder. Stomach/Bowel: Moderate-sized  sliding-type hiatal hernia. The stomach is decompressed without focal abnormality. No small bowel wall thickening or inflammation. No small bowel obstruction.The appendix was not visualized. No right lower quadrant or pericecal inflammatory changes to suggest acute appendicitis. Vascular/Lymphatic: No aortic aneurysm. Diffuse aortoiliac atherosclerosis. No intraabdominal or pelvic lymphadenopathy. Reproductive: Hysterectomy. No concerning adnexal mass.no free pelvic fluid. Other: No pneumoperitoneum or ascites Musculoskeletal: No acute fracture or destructive lesion. Osteopenia. Right hip arthroplasty is anatomically aligned without dislocation. Lumbar fusion hardware at L3-L4, unchanged. Multilevel degenerative disc disease of the spine. Moderate left hip osteoarthritis. IMPRESSION: 1. Moderate left-sided hydroureteronephrosis without obstructive ureteral calculus visualized. Given the left perinephric stranding, this may represent changes from a recently passed calculus or an ascending urinary tract infection. Correlation with urinalysis recommended. 2. Asymmetric wall thickening along the posterior aspect of the urinary bladder, possibly due to acute cystitis. Nonemergent CT urogram (with metallic artifact reduction technique) should also be considered to exclude underlying neoplasm. 3. Moderate-sized hiatal hernia. Electronically Signed   By: Rance Burrows M.D.   On: 01/02/2024 21:57        Scheduled Meds:  acidophilus  2 capsule Oral Daily   apixaban   5 mg Oral BID   atorvastatin   10 mg Oral QHS   bisoprolol   2.5 mg Oral Daily   buPROPion   75 mg Oral q morning   feeding supplement  237 mL Oral BID BM   FLUoxetine   20 mg Oral QHS   gabapentin   600 mg Oral BID   levothyroxine   88 mcg Oral Q0600   lidocaine   1-2 patch  Transdermal Q24H   pantoprazole   40 mg Oral BID AC   sodium chloride  flush  3 mL Intravenous Q12H   vancomycin   125 mg Oral BID   Continuous Infusions:  cefTRIAXone  (ROCEPHIN )  IV 2 g (01/04/24 2204)     LOS: 3 days    Time spent: 32 minutes spent on chart review, discussion with nursing staff, consultants, updating family and interview/physical exam; more than 50% of that time was spent in counseling and/or coordination of care.    Amol Domanski, DO [facility] Available via Epic secure chat 7am-7pm After these hours, please refer to coverage provider listed on [email or web address] 01/05/2024, 11:56 AM

## 2024-01-05 NOTE — Plan of Care (Signed)
  Problem: Clinical Measurements: Goal: Signs and symptoms of infection will decrease Outcome: Progressing   Problem: Respiratory: Goal: Ability to maintain adequate ventilation will improve Outcome: Progressing   Problem: Clinical Measurements: Goal: Ability to maintain clinical measurements within normal limits will improve Outcome: Progressing

## 2024-01-06 DIAGNOSIS — N12 Tubulo-interstitial nephritis, not specified as acute or chronic: Secondary | ICD-10-CM | POA: Diagnosis not present

## 2024-01-06 LAB — BASIC METABOLIC PANEL WITH GFR
Anion gap: 6 (ref 5–15)
BUN: 13 mg/dL (ref 8–23)
CO2: 21 mmol/L — ABNORMAL LOW (ref 22–32)
Calcium: 9.4 mg/dL (ref 8.9–10.3)
Chloride: 110 mmol/L (ref 98–111)
Creatinine, Ser: 1.2 mg/dL — ABNORMAL HIGH (ref 0.44–1.00)
GFR, Estimated: 46 mL/min — ABNORMAL LOW (ref >60)
Glucose, Bld: 97 mg/dL (ref 70–99)
Potassium: 3.4 mmol/L — ABNORMAL LOW (ref 3.5–5.1)
Sodium: 137 mmol/L (ref 135–145)

## 2024-01-06 LAB — CBC WITH DIFFERENTIAL/PLATELET
Abs Immature Granulocytes: 0.11 10*3/uL — ABNORMAL HIGH (ref 0.00–0.07)
Basophils Absolute: 0 10*3/uL (ref 0.0–0.1)
Basophils Relative: 0 %
Eosinophils Absolute: 0.1 10*3/uL (ref 0.0–0.5)
Eosinophils Relative: 1 %
HCT: 32 % — ABNORMAL LOW (ref 36.0–46.0)
Hemoglobin: 9.6 g/dL — ABNORMAL LOW (ref 12.0–15.0)
Immature Granulocytes: 2 %
Lymphocytes Relative: 20 %
Lymphs Abs: 1 10*3/uL (ref 0.7–4.0)
MCH: 27.5 pg (ref 26.0–34.0)
MCHC: 30 g/dL (ref 30.0–36.0)
MCV: 91.7 fL (ref 80.0–100.0)
Monocytes Absolute: 0.6 10*3/uL (ref 0.1–1.0)
Monocytes Relative: 11 %
Neutro Abs: 3.4 10*3/uL (ref 1.7–7.7)
Neutrophils Relative %: 66 %
Platelets: 316 10*3/uL (ref 150–400)
RBC: 3.49 MIL/uL — ABNORMAL LOW (ref 3.87–5.11)
RDW: 14.6 % (ref 11.5–15.5)
WBC: 5.2 10*3/uL (ref 4.0–10.5)
nRBC: 0 % (ref 0.0–0.2)

## 2024-01-06 MED ORDER — POTASSIUM CHLORIDE CRYS ER 20 MEQ PO TBCR
40.0000 meq | EXTENDED_RELEASE_TABLET | Freq: Once | ORAL | Status: AC
  Administered 2024-01-06: 40 meq via ORAL
  Filled 2024-01-06: qty 2

## 2024-01-06 NOTE — Progress Notes (Signed)
 Pt and pt's husband are concerned about discharge w/o PT evaluation. Pt has been using a BSC during hospitalization. Pt attempted to use bathroom, but is still weak and gait is unstable. Pt's husband stated that pt has " bad knees" and they have been waiting for PT evaluation but haven't seen one yet.Will report oncoming nurse in the morning regarding their concerns.

## 2024-01-06 NOTE — Progress Notes (Signed)
 Care resumed for this patient from previous RN. Agree with previous assessment. Pt resting comfortably in bed, family at bedside. Will continue to monitor.

## 2024-01-06 NOTE — Evaluation (Signed)
 Physical Therapy Evaluation Patient Details Name: [name] MRN: [id number] DOB: [birth year] Today's Date: 01/06/2024  History of Present Illness  EMMAGENE ORTNER is a 78 y.o. female with medical history significant for CKD 3A, history of CVA, atrial fibrillation on Eliquis , depression, anxiety, hypothyroidism, recent treatment for C. difficile colitis, and recurrent UTIs who presents with abdominal pain, fever, nausea, vomiting, and diarrhea.  Clinical Impression  Pt admitted with above diagnosis. Pt lives in a condo with her husband, level entry, 3 steps with L HR to enter. Pt has several assistive devices/WC at home, but demonstrates decreased function today to safely return home. She is able to complete bed mobility and Sit to Stand with supervision A. Requires 2-3 attempts to stand from bed, better able from recliner. Pt amb x51ft at CGA-min A, RW with chair follow and requires rest break, decreased tolerance to activity. Pt trials steps to simulate home entry/exit, requires max A.  Pt currently with functional limitations due to the deficits listed below (see PT Problem List). Pt will benefit from acute skilled PT to increase their independence and safety with mobility to allow discharge.           If plan is discharge home, recommend the following: A little help with walking and/or transfers;A little help with bathing/dressing/bathroom;Assistance with cooking/housework;Assist for transportation;Help with stairs or ramp for entrance   Can travel by private vehicle   Yes    Equipment Recommendations None recommended by PT  Recommendations for Other Services       Functional Status Assessment Patient has had a recent decline in their functional status and demonstrates the ability to make significant improvements in function in a reasonable and predictable amount of time.     Precautions / Restrictions Precautions Precautions: Fall Restrictions Weight Bearing Restrictions Per  Provider Order: No      Mobility  Bed Mobility Overal bed mobility: Needs Assistance Bed Mobility: Supine to Sit, Sit to Supine     Supine to sit: Supervision, HOB elevated, Used rails Sit to supine: Used rails, Supervision        Transfers Overall transfer level: Needs assistance Equipment used: Rolling walker (2 wheels) Transfers: Sit to/from Stand Sit to Stand: Supervision                Ambulation/Gait Ambulation/Gait assistance: Contact guard assist Gait Distance (Feet): 30 Feet Assistive device: Rolling walker (2 wheels) Gait Pattern/deviations: Step-to pattern, Antalgic, Trunk flexed, Wide base of support, Decreased stride length Gait velocity: dec     General Gait Details: Pt amb with dec cadence and step to pattern, hx BLE knee OA with RLE genu valgus. Pt has inc BLE stance time and has dec stride length bilaterally. dec tolerance to distance today  Stairs Stairs: Yes Stairs assistance: Max assist Stair Management: One rail Left Number of Stairs: 1 General stair comments: attempted to simulate home entry of 3 steps with L HR, pt requires max A for negotiating one step (angles towards HR), with inc knee pain and dec strength.  Wheelchair Mobility     Tilt Bed    Modified Rankin (Stroke Patients Only)       Balance Overall balance assessment: Needs assistance Sitting-balance support: Feet supported, No upper extremity supported Sitting balance-Leahy Scale: Good     Standing balance support: No upper extremity supported, During functional activity Standing balance-Leahy Scale: Fair  Pertinent Vitals/Pain Pain Assessment Pain Assessment: 0-10 Pain Score: 6  Pain Location: abdomen Pain Descriptors / Indicators: Aching, Discomfort Pain Intervention(s): Limited activity within patient's tolerance, Monitored during session    Home Living Family/patient expects to be discharged to:: Private  residence Living Arrangements: Spouse/significant other Available Help at Discharge: Family;Available PRN/intermittently Type of Home: Other(Comment) (condo) Home Access: Stairs to enter Entrance Stairs-Rails: Left Entrance Stairs-Number of Steps: 3   Home Layout: One level Home Equipment: Rollator (4 wheels);Shower seat;BSC/3in1;Wheelchair - manual;Grab bars - tub/shower;Cane - single Librarian, academic (2 wheels);Transport chair      Prior Function Prior Level of Function : Needs assist       Physical Assist : ADLs (physical)       ADLs Comments: Pt requires assistance with dredding due to BUE neuropathy; husband does driving     Extremity/Trunk Assessment   Upper Extremity Assessment Upper Extremity Assessment: Generalized weakness    Lower Extremity Assessment Lower Extremity Assessment: Generalized weakness    Cervical / Trunk Assessment Cervical / Trunk Assessment: Normal  Communication   Communication Communication: No apparent difficulties    Cognition Arousal: Alert Behavior During Therapy: WFL for tasks assessed/performed   PT - Cognitive impairments: No apparent impairments                         Following commands: Intact       Cueing Cueing Techniques: Verbal cues, Tactile cues, Visual cues     General Comments General comments (skin integrity, edema, etc.): R knee genu valgus, bilateral knee OA, crepitus throughout session, especially during transfers    Exercises     Assessment/Plan    PT Assessment Patient needs continued PT services  PT Problem List Decreased strength;Decreased balance;Decreased activity tolerance;Decreased mobility       PT Treatment Interventions DME instruction;Functional mobility training;Balance training;Patient/family education;Gait training;Therapeutic activities;Stair training;Therapeutic exercise    PT Goals (Current goals can be found in the Care Plan section)  Acute Rehab PT Goals Patient  Stated Goal: return home PT Goal Formulation: With patient Time For Goal Achievement: 01/20/24 Potential to Achieve Goals: Good    Frequency Min 3X/week     Co-evaluation               AM-PAC PT "6 Clicks" Mobility  Outcome Measure Help needed turning from your back to your side while in a flat bed without using bedrails?: A Little Help needed moving from lying on your back to sitting on the side of a flat bed without using bedrails?: A Little Help needed moving to and from a bed to a chair (including a wheelchair)?: A Little Help needed standing up from a chair using your arms (e.g., wheelchair or bedside chair)?: A Little Help needed to walk in hospital room?: A Little Help needed climbing 3-5 steps with a railing? : Total 6 Click Score: 16    End of Session Equipment Utilized During Treatment: Gait belt Activity Tolerance: Patient tolerated treatment well;Patient limited by fatigue Patient left: in bed;with call bell/phone within reach;with chair alarm set;with family/visitor present Nurse Communication: Mobility status PT Visit Diagnosis: Difficulty in walking, not elsewhere classified (R26.2);Unsteadiness on feet (R26.81)    Time: 1331-1416 PT Time Calculation (min) (ACUTE ONLY): 45 min   Charges:   PT Evaluation $PT Eval Low Complexity: 1 Low PT Treatments $Gait Training: 23-37 mins PT General Charges $$ ACUTE PT VISIT: 1 Visit         Darien Eden, PT Acute  Rehabilitation Services Office: [phone] 01/06/2024   Serafin Dames 01/06/2024, 2:46 PM

## 2024-01-06 NOTE — Progress Notes (Signed)
 PROGRESS NOTE  HAZELYN KALLEN  DOB: Jul 17, 1946  PCP: Avva, Ravisankar, MD BJY:[id number]  DOA: 01/02/2024  LOS: 4 days  Hospital Day: 5  Brief narrative: [name] is a 78 y.o. female with PMH significant for HTN, HLD, A-fib on Eliquis , h/o CVA, hypothyroidism, anxiety/depression, recent treatment for C. 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    In the ED, patient was febrile, tachycardic Labs with WBC count 13.3 CT abdomen showed moderate left-sided hydroureteronephrosis without obstructing calculus, suspected to have recently passed stone or an ascending UTI.  It also showed asymmetric bladder wall thickening. Blood culture and urine culture were collected Patient was started on IV Rocephin  Admitted to [facility] Urine culture sent on admission grew more than 100,000 CFU per mL of Proteus mirabilis and 80,000 CFU per mL of Klebsiella pneumoniae Blood culture grew Staph hominis in aerobic bottle only, likely contamination  Subjective: Patient was seen and examined this am. Lying down in bed. Not in distress. Family at bedside. Concerned about weakness. Pending PT eval. Chart reviewed In the last 24 hours, no fever, hemodynamically stable WBC count normal at 5.2, hemoglobin at 9.6 BN/creatinine 30/1.2, potassium 3.4  Assessment and plan: Sepsis -POA Left pyelonephritis Presented with abdominal pain, NVD Had fever, tachycardia CT scan with evidence of pyelonephritis-likely ascending UTI, no evidence of stone Urine culture sent on admission grew more than 100,000 CFU per mL of Proteus mirabilis and 80,000 CFU per mL of Klebsiella pneumoniae Responded to IV Rocephin  Subsequently switched to oral Keflex . Post discharge,  she needs to follow-up with urology after discharge  for nonemergent CT urogram to exclude underlying neoplasm.  Recent Labs  Lab 01/02/24 1900 01/02/24 1939 01/03/24 0021 01/04/24 0448 01/05/24 0619 01/06/24 0537  WBC 13.3*  --  11.5* 7.8 5.3 5.2  LATICACIDVEN  --  1.7  --   --   --   --    Recent C. difficile colitis Had diarrhea this presentation as well but improved before stool sample could be collected for C. difficile. Diarrhea has improved. Previous hospitalist reached out to ID who recommended to treat with oral vancomycin  125 twice daily while on antibiotics and for 7 days after completion for prevention.  AKI on CKD 3A  Creatinine was elevated 1.64 at presentation due to dehydration.  Improved back to baseline now.   Recent Labs    05/25/23 0311 05/26/23 0311 06/26/23 1023 07/12/23 0710 12/21/23 1002 01/02/24 1900 01/03/24 0021 01/04/24 0448 01/05/24 0619 01/06/24 0537  BUN 12 11 20  33* 17 25* 25* 20 16 13   CREATININE 1.06* 0.98 1.46* 1.90* 1.26* 1.64* 1.30* 1.41* 1.34* 1.20*   Hypokalemia Potassium low at 3.4 today replacement given. Recent Labs  Lab 01/02/24 1900 01/03/24 0021 01/04/24 0448 01/05/24 0619 01/06/24 0537  K 3.9 2.9* 3.9 3.8 3.4*  MG  --  1.8  --   --   --    Atrial fibrillation  Continue Eliquis , bisoprolol      H/o CVA  Eliquis , Lipitor     Non-small cell lung cancer   S/p SBRT to RML nodule in July 2024  Due for follow-up with Dr. Marguerita Shih     Hypothyroidism  Synthroid     Mobility: pending PT eval  Goals of care   Code Status:  Full Code     DVT prophylaxis:   apixaban  (ELIQUIS ) tablet 5 mg   Antimicrobials: keflex  Fluid: none Consultants: none Family Communication: family at bedside  Status: inpatient Level of care:  Med-Surg   Patient is from: home Needs to continue in-hospital care: needs PT eval.  Anticipated d/c to: family expecting CIR      Diet:  Diet Order             Diet regular Room service  appropriate? Yes; Fluid consistency: Thin  Diet effective now                   Scheduled Meds:  acidophilus  2 capsule Oral Daily   apixaban   5 mg Oral BID   atorvastatin   10 mg Oral QHS   bisoprolol   2.5 mg Oral Daily   buPROPion   75 mg Oral q morning   cephALEXin   500 mg Oral Q8H   feeding supplement  237 mL Oral BID BM   FLUoxetine   20 mg Oral QHS   gabapentin   600 mg Oral BID   levothyroxine   88 mcg Oral Q0600   pantoprazole   40 mg Oral BID AC   sodium chloride  flush  3 mL Intravenous Q12H   vancomycin   125 mg Oral BID    PRN meds: acetaminophen  **OR** acetaminophen , prochlorperazine    Infusions:    Antimicrobials: Anti-infectives (From admission, onward)    Start     Dose/Rate Route Frequency Ordered Stop   01/05/24 1400  cephALEXin  (KEFLEX ) capsule 500 mg        500 mg Oral Every 8 hours 01/05/24 1241     01/05/24 1315  ciprofloxacin  (CIPRO ) tablet 500 mg  Status:  Discontinued        500 mg Oral 2 times daily 01/05/24 1226 01/05/24 1241   01/03/24 2245  cefTRIAXone  (ROCEPHIN ) 2 g in sodium chloride  0.9 % 100 mL IVPB  Status:  Discontinued        2 g 200 mL/hr over 30 Minutes Intravenous Every 24 hours 01/02/24 2309 01/05/24 1242   01/03/24 1130  vancomycin  (VANCOCIN ) capsule 125 mg        125 mg Oral 2 times daily 01/03/24 1044     01/02/24 2245  cefTRIAXone  (ROCEPHIN ) 2 g in sodium chloride  0.9 % 100 mL IVPB        2 g 200 mL/hr over 30 Minutes Intravenous Once 01/02/24 2230 01/03/24 0115       Objective: Vitals:   01/06/24 0553 01/06/24 1258  BP: (!) 148/86 (!) 112/58  Pulse: 74 95  Resp: 18 20  Temp: 99.7 F (37.6 C) 99.8 F (37.7 C)  SpO2: 98% 99%    Intake/Output Summary (Last 24 hours) at 01/06/2024 1409 Last data filed at 01/06/2024 0900 Gross per 24 hour  Intake 120 ml  Output --  Net 120 ml   Filed Weights   01/02/24 1750  Weight: 58.1 kg   Weight change:  Body mass index is 20.66 kg/m.   Physical Exam: General exam:  Pleasant, elderly, not in distress Skin: No rashes, lesions or ulcers. HEENT: Atraumatic, normocephalic, no obvious bleeding Lungs: Clear to auscultation bilaterally CVS: S1, S2, no murmur GI/Abd: Soft, nontender, nondistended, bowel sound present, CNS: alert, awake, oriented X 3 Psychiatry: Mood appropriate,  Extremities: No pedal edema, no calf tenderness  Data Review: I have personally reviewed the laboratory data and studies available.  F/u labs ordered Unresulted Labs (From admission, onward)  Start     Ordered   01/02/24 2310  C Difficile Quick Screen w PCR reflex  (C Difficile quick screen w PCR reflex panel )  Once, for 24 hours,   TIMED       References:    CDiff Information Tool   01/02/24 2309   01/02/24 1859  Stool culture  Once,   URGENT        01/02/24 1859            Signed, Hoyt Macleod, MD [facility] 01/06/2024

## 2024-01-07 ENCOUNTER — Telehealth: Payer: Self-pay | Admitting: Medical Oncology

## 2024-01-07 DIAGNOSIS — N12 Tubulo-interstitial nephritis, not specified as acute or chronic: Secondary | ICD-10-CM | POA: Diagnosis not present

## 2024-01-07 LAB — VITAMIN B12: Vitamin B-12: 616 pg/mL (ref 180–914)

## 2024-01-07 LAB — FERRITIN: Ferritin: 62 ng/mL (ref 11–307)

## 2024-01-07 LAB — RETICULOCYTES
Immature Retic Fract: 33.4 % — ABNORMAL HIGH (ref 2.3–15.9)
RBC.: 3.8 MIL/uL — ABNORMAL LOW (ref 3.87–5.11)
Retic Count, Absolute: 27.7 10*3/uL (ref 19.0–186.0)
Retic Ct Pct: 0.7 % (ref 0.4–3.1)

## 2024-01-07 LAB — IRON AND TIBC
Iron: 34 ug/dL (ref 28–170)
Saturation Ratios: 12 % (ref 10.4–31.8)
TIBC: 295 ug/dL (ref 250–450)
UIBC: 261 ug/dL

## 2024-01-07 LAB — FOLATE: Folate: 9.3 ng/mL (ref >5.9)

## 2024-01-07 MED ORDER — DICLOFENAC SODIUM 1 % EX GEL
2.0000 g | Freq: Four times a day (QID) | CUTANEOUS | Status: DC
  Administered 2024-01-07 – 2024-01-08 (×2): 2 g via TOPICAL
  Filled 2024-01-07: qty 100

## 2024-01-07 NOTE — NC FL2 (Signed)
 [place]  MEDICAID FL2 LEVEL OF CARE FORM     IDENTIFICATION  Patient Name: [name] Birthdate: Jul 05, 1946 Sex: female Admission Date (Current Location): 01/02/2024  [facility] and [place] Number:  Producer, television/film/video and Address:  [facility],  [address] [place], [place] [address]      Provider Number: [id number]  Attending Physician Name and Address:  Hoyt Macleod, MD  Relative Name and Phone Number:  Basilia Bosworth Prout(spouse) [phone]    Current Level of Care: Hospital Recommended Level of Care: Skilled Nursing Facility Prior Approval Number:    Date Approved/Denied:   PASRR Number: [id number]  Discharge Plan:      Current Diagnoses: Patient Active Problem List   Diagnosis Date Noted   Pyelonephritis 01/02/2024   Acute renal failure superimposed on stage 3a chronic kidney disease (HCC) 01/02/2024   COPD (chronic obstructive pulmonary disease) (HCC)    Gastritis and gastroduodenitis 07/12/2023   Dilated pancreatic duct 07/12/2023   Dilation of biliary tract 07/03/2023   Acute cystitis 05/22/2023   Abdominal pain 05/22/2023   History of CVA (cerebrovascular accident) 05/22/2023   GAD (generalized anxiety disorder) 05/22/2023   Lung cancer (HCC) 03/01/2023   Lung nodule 02/15/2023   Clostridioides difficile infection 12/11/2022   Malnutrition of moderate degree (HCC) 12/08/2022   Intractable nausea and vomiting 12/07/2022   Hyponatremia 12/07/2022   Acute urinary retention 06/18/2022   Acute metabolic encephalopathy 06/17/2022   Pyuria 06/17/2022   GERD (gastroesophageal reflux disease) 06/17/2022   Hyperglycemia 06/17/2022   Low back pain 05/28/2020   Dilation of pancreatic duct 05/11/2020   Anterior dislocation of right shoulder 06/17/2019   Shoulder pain, right 06/17/2019   Duodenitis 02/24/2019   Chronic pain syndrome 02/24/2019   Debility 02/13/2019   Nausea and vomiting    Aspiration pneumonia (HCC)    Diarrhea    Pressure injury  of skin 02/08/2019   Pneumonia of right lower lobe due to methicillin susceptible Staphylococcus aureus (MSSA) (HCC)    Volume overload state of heart    Acute encephalopathy 02/01/2019   Acute respiratory failure with hypoxia (HCC) 02/01/2019   Hypokalemia 02/01/2019   Hypomagnesemia 02/01/2019   Leukocytosis 02/01/2019   Osteoarthritis 02/01/2019   Hypothyroidism 02/01/2019   Paroxysmal A-fib (HCC)    Anticoagulant long-term use    Persistent atrial fibrillation (HCC) 06/27/2017   Hip fracture (HCC) 06/27/2017   Irregular heart rate    Closed right hip fracture, initial encounter (HCC) 06/26/2017   Protein-calorie malnutrition, severe 06/30/2016   S/P laparoscopic cholecystectomy 06/28/2016   Visual field loss following stroke    Hyperlipidemia    Essential hypertension     Orientation RESPIRATION BLADDER Height & Weight     Time, Situation, Place  Normal Incontinent Weight: 58.1 kg Height:  5\' 6"  (167.6 cm)  BEHAVIORAL SYMPTOMS/MOOD NEUROLOGICAL BOWEL NUTRITION STATUS      Continent Diet (Regular)  AMBULATORY STATUS COMMUNICATION OF NEEDS Skin   Limited Assist Verbally Normal                       Personal Care Assistance Level of Assistance  Bathing, Feeding, Dressing   Feeding assistance: Limited assistance Dressing Assistance: Limited assistance     Functional Limitations Info  Sight, Hearing, Speech Sight Info: Impaired (eyeglasses) Hearing Info: Adequate Speech Info: Impaired (Dentures-top/bottom)    SPECIAL CARE FACTORS FREQUENCY  PT (By licensed PT), OT (By licensed OT)     PT Frequency: 5x week OT  Frequency: 5x week            Contractures Contractures Info: Not present    Additional Factors Info  Code Status, Allergies Code Status Info: Full Allergies Info: Pencillins           Current Medications (01/07/2024):  This is the current hospital active medication list Current Facility-Administered Medications  Medication Dose Route  Frequency Provider Last Rate Last Admin   acetaminophen  (TYLENOL ) tablet 650 mg  650 mg Oral Q6H PRN Opyd, Timothy S, MD   650 mg at 01/06/24 1821   Or   acetaminophen  (TYLENOL ) suppository 650 mg  650 mg Rectal Q6H PRN Opyd, Timothy S, MD       acidophilus (RISAQUAD) capsule 2 capsule  2 capsule Oral Daily Vann, Jessica U, DO   2 capsule at 01/07/24 0859   apixaban  (ELIQUIS ) tablet 5 mg  5 mg Oral BID Opyd, Timothy S, MD   5 mg at 01/07/24 0858   atorvastatin  (LIPITOR) tablet 10 mg  10 mg Oral QHS Opyd, Timothy S, MD   10 mg at 01/06/24 2140   bisoprolol  (ZEBETA ) tablet 2.5 mg  2.5 mg Oral Daily Opyd, Timothy S, MD   2.5 mg at 01/07/24 0858   buPROPion  (WELLBUTRIN ) tablet 75 mg  75 mg Oral q morning Opyd, Timothy S, MD   75 mg at 01/07/24 0908   cephALEXin  (KEFLEX ) capsule 500 mg  500 mg Oral Q8H Swayze, Ava, DO   500 mg at 01/07/24 0555   feeding supplement (ENSURE ENLIVE / ENSURE PLUS) liquid 237 mL  237 mL Oral BID BM Chavez, Abigail, NP   237 mL at 01/06/24 1030   FLUoxetine  (PROZAC ) capsule 20 mg  20 mg Oral QHS Opyd, Timothy S, MD   20 mg at 01/06/24 2140   gabapentin  (NEURONTIN ) capsule 600 mg  600 mg Oral BID Opyd, Timothy S, MD   600 mg at 01/07/24 0900   levothyroxine  (SYNTHROID ) tablet 88 mcg  88 mcg Oral Q0600 Opyd, Timothy S, MD   88 mcg at 01/07/24 0555   pantoprazole  (PROTONIX ) EC tablet 40 mg  40 mg Oral BID AC Opyd, Timothy S, MD   40 mg at 01/07/24 0859   prochlorperazine  (COMPAZINE ) injection 5 mg  5 mg Intravenous Q6H PRN Opyd, Timothy S, MD   5 mg at 01/06/24 1821   sodium chloride  flush (NS) 0.9 % injection 3 mL  3 mL Intravenous Q12H Opyd, Timothy S, MD   3 mL at 01/07/24 0900   vancomycin  (VANCOCIN ) capsule 125 mg  125 mg Oral BID Vann, Jessica U, DO   125 mg at 01/07/24 0900     Discharge Medications: Please see discharge summary for a list of discharge medications.  Relevant Imaging Results:  Relevant Lab Results:   Additional Information [id number] [place], [place]

## 2024-01-07 NOTE — Progress Notes (Signed)
 PROGRESS NOTE  [name]  DOB: Aug 19, 1946  PCP: Avva, Ravisankar, MD GUY:[id number]  DOA: 01/02/2024  LOS: 5 days  Hospital Day: 6  Brief narrative: [name] is a 78 y.o. female with PMH significant for HTN, HLD, A-fib on Eliquis , h/o CVA, hypothyroidism, anxiety/depression, recent treatment for C. 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    In the ED, patient was febrile, tachycardic Labs with WBC count 13.3 CT abdomen showed moderate left-sided hydroureteronephrosis without obstructing calculus, suspected to have recently passed stone or an ascending UTI.  It also showed asymmetric bladder wall thickening. Blood culture and urine culture were collected Patient was started on IV Rocephin  Admitted to [facility] Urine culture sent on admission grew more than 100,000 CFU per mL of Proteus mirabilis and 80,000 CFU per mL of Klebsiella pneumoniae Blood culture grew Staph hominis in aerobic bottle only, likely contamination  Subjective: Patient was seen and examined this am. He is trying to use bedside commode.  Not in distress.  Able to have a conversation.  Husband at bedside.  Daughter on the phone. Seen by PT yesterday.  SNF recommended.  Family is hopeful that on reevaluation today, she could qualify for CIR.  Assessment and plan: Sepsis -POA Left pyelonephritis Presented with abdominal pain, NVD Had fever, tachycardia CT scan with evidence of pyelonephritis-likely ascending UTI, no evidence of stone Urine culture sent on admission grew more than 100,000 CFU per mL of Proteus mirabilis and 80,000 CFU per mL of Klebsiella pneumoniae Responded to IV Rocephin  Subsequently switched to oral Keflex  which she is currently  on Post discharge, she needs to follow-up with urology after discharge  for nonemergent CT urogram to exclude underlying neoplasm. Recent Labs  Lab 01/02/24 1900 01/02/24 1939 01/03/24 0021 01/04/24 0448 01/05/24 0619 01/06/24 0537  WBC 13.3*  --  11.5* 7.8 5.3 5.2  LATICACIDVEN  --  1.7  --   --   --   --    Recent C. difficile colitis Had diarrhea this presentation as well but improved before stool sample could be collected for C. difficile. Diarrhea has improved. Previous hospitalist reached out to ID who recommended to treat with oral vancomycin  125 milligram twice daily while on antibiotics and for 7 days after completion for prevention.  AKI on CKD 3A  Creatinine was elevated 1.64 at presentation due to dehydration.  Improved back to baseline.   Recent Labs    05/25/23 0311 05/26/23 0311 06/26/23 1023 07/12/23 0710 12/21/23 1002 01/02/24 1900 01/03/24 0021 01/04/24 0448 01/05/24 0619 01/06/24 0537  BUN 12 11 20  33* 17 25* 25* 20 16 13   CREATININE 1.06* 0.98 1.46* 1.90* 1.26* 1.64* 1.30* 1.41* 1.34* 1.20*   Hypokalemia Potassium replacement was given Recent Labs  Lab 01/02/24 1900 01/03/24 0021 01/04/24 0448 01/05/24 0619 01/06/24 0537  K 3.9 2.9* 3.9 3.8 3.4*  MG  --  1.8  --   --   --    Atrial fibrillation  Continue Eliquis , bisoprolol      H/o CVA  Eliquis , Lipitor     Non-small cell lung cancer   S/p SBRT to RML nodule in July 2024  Due for follow-up with Dr. Marguerita Shih    Acute on chronic anemia Chronic iron deficiency anemia No active blood  loss.  Hemoglobin slightly low between 9 and 10. In 2024 October, ferritin level was significantly low at 10.  Repeat iron profile Recent Labs    05/25/23 0311 05/26/23 0311 01/02/24 1900 01/03/24 0021 01/04/24 0448 01/05/24 0619 01/06/24 0537  HGB 11.5*   < > 12.0 10.6* 10.6* 11.9* 9.6*  MCV 97.5   < > 88.6 93.7 94.7 90.8 91.7  VITAMINB12 218  --   --   --   --   --   --   FOLATE 10.0  --   --   --    --   --   --   FERRITIN 10*  --   --   --   --   --   --   TIBC 340  --   --   --   --   --   --   IRON 34  --   --   --   --   --   --   RETICCTPCT 3.2*  --   --   --   --   --   --    < > = values in this interval not displayed.   Hypothyroidism  Synthroid    Impaired mobility Seen by PT.  SNF recommended.  Family is hopeful that on reevaluation today, she could qualify for CIR.   Goals of care   Code Status: Full Code     DVT prophylaxis:   apixaban  (ELIQUIS ) tablet 5 mg   Antimicrobials: keflex  Fluid: none Consultants: none Family Communication: family at bedside  Status: inpatient Level of care:  Med-Surg   Patient is from: home Needs to continue in-hospital care: Medically stabilized Anticipated d/c to: family expecting CIR    Diet:  Diet Order             Diet regular Room service appropriate? Yes; Fluid consistency: Thin  Diet effective now                   Scheduled Meds:  acidophilus  2 capsule Oral Daily   apixaban   5 mg Oral BID   atorvastatin   10 mg Oral QHS   bisoprolol   2.5 mg Oral Daily   buPROPion   75 mg Oral q morning   cephALEXin   500 mg Oral Q8H   feeding supplement  237 mL Oral BID BM   FLUoxetine   20 mg Oral QHS   gabapentin   600 mg Oral BID   levothyroxine   88 mcg Oral Q0600   pantoprazole   40 mg Oral BID AC   sodium chloride  flush  3 mL Intravenous Q12H   vancomycin   125 mg Oral BID    PRN meds: acetaminophen  **OR** acetaminophen , prochlorperazine    Infusions:    Antimicrobials: Anti-infectives (From admission, onward)    Start     Dose/Rate Route Frequency Ordered Stop   01/05/24 1400  cephALEXin  (KEFLEX ) capsule 500 mg        500 mg Oral Every 8 hours 01/05/24 1241     01/05/24 1315  ciprofloxacin  (CIPRO ) tablet 500 mg  Status:  Discontinued        500 mg Oral 2 times daily 01/05/24 1226 01/05/24 1241   01/03/24 2245  cefTRIAXone  (ROCEPHIN ) 2 g in sodium chloride  0.9 % 100 mL IVPB  Status:  Discontinued        2  g 200 mL/hr over 30 Minutes Intravenous Every 24 hours 01/02/24 2309 01/05/24 1242   01/03/24 1130  vancomycin  (VANCOCIN ) capsule  125 mg        125 mg Oral 2 times daily 01/03/24 1044     01/02/24 2245  cefTRIAXone  (ROCEPHIN ) 2 g in sodium chloride  0.9 % 100 mL IVPB        2 g 200 mL/hr over 30 Minutes Intravenous Once 01/02/24 2230 01/03/24 0115       Objective: Vitals:   01/06/24 2022 01/07/24 0516  BP: 133/84 (!) 144/97  Pulse: (!) 104 73  Resp: 18 18  Temp: 98.4 F (36.9 C) 98 F (36.7 C)  SpO2: 96% 98%    Intake/Output Summary (Last 24 hours) at 01/07/2024 1046 Last data filed at 01/06/2024 1714 Gross per 24 hour  Intake 240 ml  Output --  Net 240 ml   Filed Weights   01/02/24 1750  Weight: 58.1 kg   Weight change:  Body mass index is 20.66 kg/m.   Physical Exam: General exam: Pleasant, elderly, not in distress Skin: No rashes, lesions or ulcers. HEENT: Atraumatic, normocephalic, no obvious bleeding Lungs: Clear to auscultation bilaterally CVS: S1, S2, no murmur GI/Abd: Soft, nontender, nondistended, bowel sound present, CNS: Alert, awake, and x 3 Psychiatry: Mood appropriate,  Extremities: No pedal edema, no calf tenderness  Data Review: I have personally reviewed the laboratory data and studies available.  F/u labs ordered Unresulted Labs (From admission, onward)     Start     Ordered   01/08/24 0500  Basic metabolic panel with GFR  Tomorrow morning,   R       Question:  Specimen collection method  Answer:  Lab=Lab collect   01/07/24 0759   01/08/24 0500  CBC with Differential/Platelet  Tomorrow morning,   R       Question:  Specimen collection method  Answer:  Lab=Lab collect   01/07/24 0759   01/07/24 1046  Vitamin B12  (Anemia Panel (PNL))  Once,   R       Question:  Specimen collection method  Answer:  Lab=Lab collect   01/07/24 1045   01/07/24 1046  Folate  (Anemia Panel (PNL))  Once,   R       Question:  Specimen collection method  Answer:   Lab=Lab collect   01/07/24 1045   01/07/24 1046  Iron and TIBC  (Anemia Panel (PNL))  Once,   R       Question:  Specimen collection method  Answer:  Lab=Lab collect   01/07/24 1045   01/07/24 1046  Ferritin  (Anemia Panel (PNL))  Once,   R       Question:  Specimen collection method  Answer:  Lab=Lab collect   01/07/24 1045   01/07/24 1046  Reticulocytes  (Anemia Panel (PNL))  Once,   R       Question:  Specimen collection method  Answer:  Lab=Lab collect   01/07/24 1045   01/02/24 1859  Stool culture  Once,   URGENT        01/02/24 1859            Signed, Hoyt Macleod, MD [facility] 01/07/2024

## 2024-01-07 NOTE — Progress Notes (Signed)
 Physical Therapy Treatment Patient Details Name: [name] MRN: [id number] DOB: 10/28/1945 Today's Date: 01/07/2024   History of Present Illness [name] is a 78 y.o. female with medical history significant for CKD 3A, history of CVA, atrial fibrillation on Eliquis , depression, anxiety, hypothyroidism, recent treatment for C. difficile colitis, and recurrent UTIs who presents with abdominal pain, fever, nausea, vomiting, and diarrhea.    PT Comments  Pt in chair, finished lunch, agreeable to attempt stairs. Pt able to complete amb to and from the stairwell, 73ft each way. Attempts to complete steps required for home entry/exit, unable to ascend 1 step without physical assist, fatigues and unable to progress safely beyond this point, returns to floor and amb back to room, gets into bed, sit to supine supervision A.  Based on pt PLOF, level of support, and current functional status, she would benefit from skilled PT <3 hours per day at dc.     If plan is discharge home, recommend the following: A little help with walking and/or transfers;A little help with bathing/dressing/bathroom;Assistance with cooking/housework;Assist for transportation;Help with stairs or ramp for entrance   Can travel by private vehicle     Yes  Equipment Recommendations  None recommended by PT    Recommendations for Other Services       Precautions / Restrictions Precautions Precautions: Fall Recall of Precautions/Restrictions: Intact Restrictions Weight Bearing Restrictions Per Provider Order: No     Mobility  Bed Mobility Overal bed mobility: Needs Assistance Bed Mobility: Supine to Sit     Supine to sit: Supervision, Used rails          Transfers Overall transfer level: Needs assistance Equipment used: Rolling walker (2 wheels) Transfers: Sit to/from Stand Sit to Stand: Supervision                Ambulation/Gait Ambulation/Gait assistance: Supervision Gait Distance (Feet):  60 Feet Assistive device: Rolling walker (2 wheels) (regular and youth (x98ft with each device)) Gait Pattern/deviations: Antalgic, Trunk flexed, Wide base of support, Decreased stride length, Step-through pattern Gait velocity: dec     General Gait Details: Pt cadence improves, completes 1 bout of 60 ft with RW (youth) to optimize UE weight bearing. Pt has step through pattern, although stride length is shortened, improved from previous session   Stairs Stairs: Yes Stairs assistance: Max assist Stair Management: One rail Left Number of Stairs: 1 General stair comments: Pt lacks strength to complete steps required for home entry/exit, uses BUE and max A on steps, unable to progress beyond 1 step   Wheelchair Mobility     Tilt Bed    Modified Rankin (Stroke Patients Only)       Balance Overall balance assessment: Needs assistance Sitting-balance support: Feet supported, No upper extremity supported Sitting balance-Leahy Scale: Good     Standing balance support: No upper extremity supported, During functional activity Standing balance-Leahy Scale: Fair                              Communication    Cognition Arousal: Alert Behavior During Therapy: WFL for tasks assessed/performed   PT - Cognitive impairments: No apparent impairments                                Cueing    Exercises      General Comments General comments (skin integrity, edema, etc.): R knee  genu valgus, bilateral knee OA, crepitus throughout session, especially during transfers      Pertinent Vitals/Pain Pain Assessment Pain Assessment: Faces Faces Pain Scale: Hurts little more Pain Location: bilateral knees Pain Descriptors / Indicators: Aching, Discomfort Pain Intervention(s): Limited activity within patient's tolerance, Repositioned, Monitored during session    Home Living                          Prior Function            PT Goals (current goals  can now be found in the care plan section) Acute Rehab PT Goals Patient Stated Goal: return home PT Goal Formulation: With patient Time For Goal Achievement: 01/20/24 Potential to Achieve Goals: Good Progress towards PT goals: Progressing toward goals    Frequency    Min 3X/week      PT Plan      Co-evaluation              AM-PAC PT "6 Clicks" Mobility   Outcome Measure  Help needed turning from your back to your side while in a flat bed without using bedrails?: None Help needed moving from lying on your back to sitting on the side of a flat bed without using bedrails?: A Little Help needed moving to and from a bed to a chair (including a wheelchair)?: A Little Help needed standing up from a chair using your arms (e.g., wheelchair or bedside chair)?: A Little Help needed to walk in hospital room?: A Little Help needed climbing 3-5 steps with a railing? : Total 6 Click Score: 17    End of Session Equipment Utilized During Treatment: Gait belt Activity Tolerance: Patient tolerated treatment well;Patient limited by pain (bilateral knees) Patient left: with call bell/phone within reach;with family/visitor present;in chair Nurse Communication: Mobility status PT Visit Diagnosis: Difficulty in walking, not elsewhere classified (R26.2);Unsteadiness on feet (R26.81)     Time: 1111-1140 PT Time Calculation (min) (ACUTE ONLY): 29 min  Charges:    $Gait Training: 23-37 mins PT General Charges $$ ACUTE PT VISIT: 1 Visit                     Darien Eden, PT Acute Rehabilitation Services Office: [phone] 01/07/2024    Serafin Dames 01/07/2024, 1:16 PM

## 2024-01-07 NOTE — Progress Notes (Signed)
 Subjective: The patient, with a history of stage IA lung cancer, presents with nausea, vomiting, and diarrhea. She is accompanied by her granddaughter.  She has been experiencing severe vomiting and diarrhea, accompanied by significant abdominal pain, leading to a seven-day hospitalization. She feels better now after receiving fluids and treatment. No fever or chills in the last three to four days.  A CT scan of her abdomen revealed hydronephrosis, possibly due to back pressure from a passed kidney stone. She denies noticing any blood in her urine.  She has a history of lung cancer diagnosed in July 2024, treated with radiation. Recent imaging shows increased density around a nodule in the right middle lobe.  She is currently being treated for a urinary tract infection and Clostridioides difficile infection, which are contributing to her symptoms. She is receiving antibiotics for both infections.  Objective: Vital signs in last 24 hours: Temp:  [98 F (36.7 C)-98.4 F (36.9 C)] 98.2 F (36.8 C) (05/19 1312) Pulse Rate:  [73-104] 92 (05/19 1312) Resp:  [18] 18 (05/19 1312) BP: (133-153)/(84-99) 153/99 (05/19 1312) SpO2:  [96 %-99 %] 99 % (05/19 1312)  Intake/Output from previous day: 05/18 0701 - 05/19 0700 In: 360 [P.O.:360] Out: -  Intake/Output this shift: No intake/output data recorded.  General appearance: alert, cooperative, fatigued, and no distress Resp: clear to auscultation bilaterally Cardio: regular rate and rhythm, S1, S2 normal, no murmur, click, rub or gallop GI: soft, non-tender; bowel sounds normal; no masses,  no organomegaly Extremities: extremities normal, atraumatic, no cyanosis or edema  Lab Results:  Recent Labs    01/05/24 0619 01/06/24 0537  WBC 5.3 5.2  HGB 11.9* 9.6*  HCT 38.6 32.0*  PLT 257 316   BMET Recent Labs    01/05/24 0619 01/06/24 0537  NA 138 137  K 3.8 3.4*  CL 109 110  CO2 20* 21*  GLUCOSE 97 97  BUN 16 13  CREATININE 1.34*  1.20*  CALCIUM  9.9 9.4    Studies/Results: No results found.  Medications: I have reviewed the patient's current medications.   Assessment/Plan: Lung cancer Stage IA NSCLC: Lung cancer in the right middle lobe, diagnosed in July 2024 and treated with radiation. Recent CT scan shows increased density around the nodule, possibly due to radiation changes. Differential includes radiation changes versus cancer recurrence. PET scan is necessary to rule out cancer recurrence. - Order PET scan after discharge to assess for cancer recurrence. - Schedule follow-up appointment approximately two weeks after PET scan to review results.  Hydronephrosis Hydronephrosis likely secondary to a passed nephrolithiasis, as indicated by back pressure in the kidney observed on imaging. No hematuria reported.  Urinary tract infection Currently being treated for a urinary tract infection with Keflex . No fever or chills reported in the last three to four days.  Clostridioides difficile infection Currently being treated for Clostridioides difficile infection with Vancomycin . Symptoms have improved over the last few days.  Nausea and vomiting Nausea and vomiting have improved after hospitalization and treatment with fluids. Currently being managed by the hospital team.    LOS: 5 days    Aurelio Blower 01/07/2024

## 2024-01-07 NOTE — Telephone Encounter (Signed)
 Pt admitted to room 1410 [facility].  Requested Dr. Marguerita Shih to see pt and review her CT scan results.

## 2024-01-07 NOTE — TOC Progression Note (Signed)
 Transition of Care [facility]) - Progression Note    Patient Details  Name: [name] MRN: [id number] Date of Birth: February 03, 1946  [facility] of [facility] CM/SW Contact  Adanna Zuckerman, Thersia Flax, RN Phone Number: 01/07/2024, 1:01 PM  Clinical Narrative:  Per patient agreement faxed out await choice prior auth.     Expected Discharge Plan: Skilled Nursing Facility Barriers to Discharge: Continued Medical Work up  Expected Discharge Plan and Services   Discharge Planning Services: CM Consult   Living arrangements for the past 2 months: Single Family Home                                       Social Determinants of Health (SDOH) Interventions SDOH Screenings   Food Insecurity: No Food Insecurity (01/03/2024)  Housing: Low Risk  (01/03/2024)  Transportation Needs: No Transportation Needs (01/03/2024)  Utilities: Not At Risk (01/03/2024)  Depression (PHQ2-9): Low Risk  (02/28/2023)  Social Connections: Socially Integrated (01/04/2024)  Tobacco Use: Medium Risk (01/02/2024)    Readmission Risk Interventions    01/03/2024   12:11 PM 05/23/2023   10:20 AM  Readmission Risk Prevention Plan  Transportation Screening Complete Complete  PCP or Specialist Appt within 5-7 Days  Complete  PCP or Specialist Appt within 3-5 Days Complete   Home Care Screening  Complete  Medication Review (RN CM)  Complete  HRI or Home Care Consult Complete   Social Work Consult for Recovery Care Planning/Counseling Complete   Palliative Care Screening Complete   Medication Review Oceanographer) Complete

## 2024-01-07 NOTE — Progress Notes (Signed)
 Physical Therapy Treatment Patient Details Name: [name] MRN: [id number] DOB: 05/12/1946 Today's Date: 01/07/2024   History of Present Illness [name] is a 78 y.o. female with medical history significant for CKD 3A, history of CVA, atrial fibrillation on Eliquis , depression, anxiety, hypothyroidism, recent treatment for C. difficile colitis, and recurrent UTIs who presents with abdominal pain, fever, nausea, vomiting, and diarrhea.    PT Comments  Pt demonstrates improved activity tolerance today, but remains limited in functional distances consistent with home demands. RW adjusted to youth size with improved efficiency, limited with BLE knee pain. Pt states that she uses voltaren  at home to assist with symptom management, nursing notified.  Pt returns to recliner and lunch delivered   If plan is discharge home, recommend the following: A little help with walking and/or transfers;A little help with bathing/dressing/bathroom;Assistance with cooking/housework;Assist for transportation;Help with stairs or ramp for entrance   Can travel by private vehicle     Yes  Equipment Recommendations  None recommended by PT    Recommendations for Other Services       Precautions / Restrictions Precautions Precautions: Fall Recall of Precautions/Restrictions: Intact Restrictions Weight Bearing Restrictions Per Provider Order: No     Mobility  Bed Mobility Overal bed mobility: Needs Assistance Bed Mobility: Supine to Sit     Supine to sit: Supervision, Used rails          Transfers Overall transfer level: Needs assistance Equipment used: Rolling walker (2 wheels) Transfers: Sit to/from Stand Sit to Stand: Supervision                Ambulation/Gait Ambulation/Gait assistance: Supervision Gait Distance (Feet): 60 Feet Assistive device: Rolling walker (2 wheels) (regular and youth (x42ft with each device)) Gait Pattern/deviations: Antalgic, Trunk flexed, Wide  base of support, Decreased stride length, Step-through pattern Gait velocity: dec     General Gait Details: Pt cadence improves, completes 2 bouts of 60 ft with RW and then with RW (youth) to optimize UE weight bearing. Pt has emergence of step through pattern, although stride length is shortened significantly, cues for body position in the walker   Stairs             Wheelchair Mobility     Tilt Bed    Modified Rankin (Stroke Patients Only)       Balance Overall balance assessment: Needs assistance Sitting-balance support: Feet supported, No upper extremity supported Sitting balance-Leahy Scale: Good     Standing balance support: No upper extremity supported, During functional activity Standing balance-Leahy Scale: Fair                              Communication    Cognition Arousal: Alert Behavior During Therapy: WFL for tasks assessed/performed   PT - Cognitive impairments: No apparent impairments                                Cueing    Exercises      General Comments General comments (skin integrity, edema, etc.): R knee genu valgus, bilateral knee OA, crepitus throughout session, especially during transfers      Pertinent Vitals/Pain Pain Assessment Pain Assessment: Faces Faces Pain Scale: Hurts little more Pain Location: bilateral knees Pain Descriptors / Indicators: Aching, Discomfort Pain Intervention(s): Limited activity within patient's tolerance, Repositioned, Monitored during session    Home Living  Prior Function            PT Goals (current goals can now be found in the care plan section) Acute Rehab PT Goals Patient Stated Goal: return home PT Goal Formulation: With patient Time For Goal Achievement: 01/20/24 Potential to Achieve Goals: Good Progress towards PT goals: Progressing toward goals    Frequency    Min 3X/week      PT Plan      Co-evaluation               AM-PAC PT "6 Clicks" Mobility   Outcome Measure  Help needed turning from your back to your side while in a flat bed without using bedrails?: None Help needed moving from lying on your back to sitting on the side of a flat bed without using bedrails?: A Little Help needed moving to and from a bed to a chair (including a wheelchair)?: A Little Help needed standing up from a chair using your arms (e.g., wheelchair or bedside chair)?: A Little Help needed to walk in hospital room?: A Little Help needed climbing 3-5 steps with a railing? : Total 6 Click Score: 17    End of Session Equipment Utilized During Treatment: Gait belt Activity Tolerance: Patient tolerated treatment well;Patient limited by pain (bilateral knees) Patient left: with call bell/phone within reach;with family/visitor present;in chair Nurse Communication: Mobility status PT Visit Diagnosis: Difficulty in walking, not elsewhere classified (R26.2);Unsteadiness on feet (R26.81)     Time: 1111-1140 PT Time Calculation (min) (ACUTE ONLY): 29 min  Charges:    $Gait Training: 23-37 mins PT General Charges $$ ACUTE PT VISIT: 1 Visit                     Darien Eden, PT Acute Rehabilitation Services Office: [phone] 01/07/2024    [name] 01/07/2024, 12:01 PM

## 2024-01-08 DIAGNOSIS — N12 Tubulo-interstitial nephritis, not specified as acute or chronic: Secondary | ICD-10-CM | POA: Diagnosis not present

## 2024-01-08 LAB — CBC WITH DIFFERENTIAL/PLATELET
Abs Immature Granulocytes: 0.27 10*3/uL — ABNORMAL HIGH (ref 0.00–0.07)
Basophils Absolute: 0 10*3/uL (ref 0.0–0.1)
Basophils Relative: 1 %
Eosinophils Absolute: 0.1 10*3/uL (ref 0.0–0.5)
Eosinophils Relative: 2 %
HCT: 31.9 % — ABNORMAL LOW (ref 36.0–46.0)
Hemoglobin: 9.8 g/dL — ABNORMAL LOW (ref 12.0–15.0)
Immature Granulocytes: 5 %
Lymphocytes Relative: 26 %
Lymphs Abs: 1.5 10*3/uL (ref 0.7–4.0)
MCH: 27.9 pg (ref 26.0–34.0)
MCHC: 30.7 g/dL (ref 30.0–36.0)
MCV: 90.9 fL (ref 80.0–100.0)
Monocytes Absolute: 0.7 10*3/uL (ref 0.1–1.0)
Monocytes Relative: 11 %
Neutro Abs: 3.3 10*3/uL (ref 1.7–7.7)
Neutrophils Relative %: 55 %
Platelets: 393 10*3/uL (ref 150–400)
RBC: 3.51 MIL/uL — ABNORMAL LOW (ref 3.87–5.11)
RDW: 14.5 % (ref 11.5–15.5)
WBC: 5.9 10*3/uL (ref 4.0–10.5)
nRBC: 0 % (ref 0.0–0.2)

## 2024-01-08 LAB — BASIC METABOLIC PANEL WITH GFR
Anion gap: 6 (ref 5–15)
BUN: 11 mg/dL (ref 8–23)
CO2: 25 mmol/L (ref 22–32)
Calcium: 9.6 mg/dL (ref 8.9–10.3)
Chloride: 108 mmol/L (ref 98–111)
Creatinine, Ser: 1.2 mg/dL — ABNORMAL HIGH (ref 0.44–1.00)
GFR, Estimated: 46 mL/min — ABNORMAL LOW (ref >60)
Glucose, Bld: 97 mg/dL (ref 70–99)
Potassium: 3.8 mmol/L (ref 3.5–5.1)
Sodium: 139 mmol/L (ref 135–145)

## 2024-01-08 LAB — CULTURE, BLOOD (ROUTINE X 2)
Culture: NO GROWTH
Special Requests: ADEQUATE

## 2024-01-08 MED ORDER — CEPHALEXIN 500 MG PO CAPS: 500.0000 mg | ORAL_CAPSULE | Freq: Three times a day (TID) | ORAL | 0 refills | Status: AC

## 2024-01-08 MED ORDER — NYSTATIN 100000 UNIT/ML MT SUSP: 5.0000 mL | Freq: Four times a day (QID) | OROMUCOSAL | 0 refills | Status: AC

## 2024-01-08 MED ORDER — FERROUS SULFATE 325 (65 FE) MG PO TABS: 325.0000 mg | ORAL_TABLET | Freq: Every day | ORAL | 0 refills | Status: DC

## 2024-01-08 MED ORDER — VANCOMYCIN HCL 125 MG PO CAPS: 125.0000 mg | ORAL_CAPSULE | Freq: Two times a day (BID) | ORAL | 0 refills | Status: AC

## 2024-01-08 MED ORDER — FERROUS SULFATE 325 (65 FE) MG PO TABS: 325.0000 mg | ORAL_TABLET | Freq: Every day | ORAL | Status: DC

## 2024-01-08 NOTE — Plan of Care (Signed)
  Problem: Clinical Measurements: Goal: Diagnostic test results will improve Outcome: Progressing   Problem: Respiratory: Goal: Ability to maintain adequate ventilation will improve Outcome: Progressing   Problem: Education: Goal: Knowledge of General Education information will improve Description: Including pain rating scale, medication(s)/side effects and non-pharmacologic comfort measures Outcome: Progressing   Problem: Clinical Measurements: Goal: Diagnostic test results will improve Outcome: Progressing Goal: Respiratory complications will improve Outcome: Progressing   Problem: Nutrition: Goal: Adequate nutrition will be maintained Outcome: Progressing   Problem: Coping: Goal: Level of anxiety will decrease Outcome: Progressing   Problem: Pain Managment: Goal: General experience of comfort will improve and/or be controlled Outcome: Progressing

## 2024-01-08 NOTE — Progress Notes (Signed)
 Increased time spent with pt and family providing education and discussing pt's current LOF and baseline in order for potential return home with home health services vs STR. Pt is currently at a Supervision/CGA level for bed mobility, transfers, short distance gait, and negotiation of 7" step with Left rail and light support on Right. Pt has made excellent functional progress and may be well suited to continue to regain strength and mobility in home personal environment with assist from spouse and family as needed. No DME needs if cleared to return home. Pt has a very supportive family and has utilized [facility]/OT in the past. Will continue PT per POC acutely.    01/08/24 1230  PT Visit Information  Assistance Needed +1  History of Present Illness [name] is a 78 y.o. female with medical history significant for CKD 3A, history of CVA, atrial fibrillation on Eliquis , depression, anxiety, hypothyroidism, recent treatment for C. difficile colitis, and recurrent UTIs who presents with abdominal pain, fever, nausea, vomiting, and diarrhea.  Subjective Data  Subjective I'm feeling much better  Patient Stated Goal return home  Precautions  Precautions Fall  Recall of Precautions/Restrictions Intact  Restrictions  Weight Bearing Restrictions Per Provider Order No  Pain Assessment  Pain Assessment Faces  Faces Pain Scale 4  Pain Location bilateral knees  Pain Descriptors / Indicators Aching;Discomfort  Pain Intervention(s) Limited activity within patient's tolerance  Cognition  Arousal Alert  Behavior During Therapy WFL for tasks assessed/performed  PT - Cognitive impairments No apparent impairments  Following Commands  Following commands Intact  Cueing  Cueing Techniques Verbal cues;Tactile cues;Visual cues  Communication  Communication No apparent difficulties  Bed Mobility  Overal bed mobility Needs Assistance  Bed Mobility Supine to Sit  Supine to sit Supervision;Used rails   General bed mobility comments No physical assist to transfer to edge of bed  Transfers  Overall transfer level Needs assistance  Equipment used Rolling walker (2 wheels)  Transfers Sit to/from Stand  Sit to Stand Supervision  General transfer comment Able to stand from chair on first attempt  Ambulation/Gait  Ambulation/Gait assistance Supervision  Gait Distance (Feet) 40 Feet  Assistive device Rolling walker (2 wheels) (Youth height)  Gait Pattern/deviations Antalgic;Trunk flexed;Wide base of support;Decreased stride length;Step-through pattern  General Gait Details Significant R knee valgus affecting upright posture due to length descrepency from mis-aligned joint line  Gait velocity dec  Stairs Yes  Stairs assistance Contact guard assist  Stair Management One rail Left  Number of Stairs 1  General stair comments Pt able to negotiate single 7" step with Left rail and light support on Right without difficulty  Balance  Overall balance assessment Needs assistance  Sitting-balance support Feet supported;No upper extremity supported  Sitting balance-Leahy Scale Good  Standing balance support Bilateral upper extremity supported;During functional activity;Reliant on assistive device for balance  Standing balance-Leahy Scale Fair  Standing balance comment No LOB with ambulation in room with RW  General Comments  General comments (skin integrity, edema, etc.) Pt educated on role of PT, current LOF and baseline function for safe return home  Exercises  Exercises General Lower Extremity;Other exercises  General Exercises - Lower Extremity  Ankle Circles/Pumps AROM;Both;10 reps;Seated  Long Arc Quad AROM;Both;10 reps;Seated  Other Exercises  Other Exercises Pt and family educated on proper car transfers, stair negotiation, and benefits of potential HHPT vs STR.  Other Exercises Pt given visual handouts for fall recovery/getting up from floor, gait belt and proper use  PT -  End of  Session  Equipment Utilized During Treatment Gait belt  Activity Tolerance Patient tolerated treatment well  Patient left in chair;with call bell/phone within reach;with chair alarm set;with family/visitor present  Nurse Communication Mobility status   PT - Assessment/Plan  PT Visit Diagnosis Difficulty in walking, not elsewhere classified (R26.2);Unsteadiness on feet (R26.81)  PT Frequency (ACUTE ONLY) Min 4X/week  Follow Up Recommendations Skilled nursing-short term rehab (<3 hours/day)  Can patient physically be transported by private vehicle Yes  Patient can return home with the following A little help with walking and/or transfers;A little help with bathing/dressing/bathroom;Assistance with cooking/housework;Assist for transportation;Help with stairs or ramp for entrance  PT equipment None recommended by PT  AM-PAC PT "6 Clicks" Mobility Outcome Measure (Version 2)  Help needed turning from your back to your side while in a flat bed without using bedrails? 4  Help needed moving from lying on your back to sitting on the side of a flat bed without using bedrails? 3  Help needed moving to and from a bed to a chair (including a wheelchair)? 3  Help needed standing up from a chair using your arms (e.g., wheelchair or bedside chair)? 3  Help needed to walk in hospital room? 3  Help needed climbing 3-5 steps with a railing?  3  6 Click Score 19  Consider Recommendation of Discharge To: [facility] with [facility]  Progressive Mobility  What is the highest level of mobility based on the progressive mobility assessment? Level 5 (Walks with assist in room/hall) - Balance while stepping forward/back and can walk in room with assist - Complete  Activity Ambulated with assistance in room  PT Goal Progression  Progress towards PT goals Progressing toward goals  PT Time Calculation  PT Start Time (ACUTE ONLY) 1120  PT Stop Time (ACUTE ONLY) 1214  PT Time Calculation (min) (ACUTE ONLY) 54 min  PT General Charges   $$ ACUTE PT VISIT 1 Visit  PT Treatments  $Gait Training 8-22 mins  $Therapeutic Exercise 8-22 mins  $Therapeutic Activity 23-37 mins  Melvyn Stagers, PTA 01/08/2024

## 2024-01-08 NOTE — Care Management Important Message (Signed)
 Important Message  Patient Details IM Letter given Name: [name] MRN: [id number] Date of Birth: Nov 23, 1945   Important Message Given:  Yes - Medicare IM     [name] 01/08/2024, 2:42 PM

## 2024-01-08 NOTE — Progress Notes (Signed)
 Patient to be discharged to home today. All Discharge Instructions including all discharge Medications and schedules for these Medications. Understanding verbalized. Discharge AVS with the Patient at time discharge

## 2024-01-08 NOTE — Discharge Summary (Addendum)
 Physician Discharge Summary  [name]:[id number] DOB: 1945/12/18 DOA: 01/02/2024  PCP: Avva, Ravisankar, MD  Admit date: 01/02/2024 Discharge date: 01/08/2024  Admitted From: Home Discharge disposition: Home with home health PT OT  Recommendations at discharge:  Continue Keflex  for 1 more week to complete the course of antibiotics Continue oral vancomycin  for the duration of Keflex  and 1 more week following completion. Follow-up with urology after discharge  for nonemergent CT urogram to exclude underlying neoplasm.   Brief narrative: [name] is a 78 y.o. female with PMH significant for HTN, HLD, A-fib on Eliquis , h/o CVA, hypothyroidism, anxiety/depression, recent treatment for C. 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    In the ED, patient was febrile, tachycardic Labs with WBC count 13.3 CT abdomen showed moderate left-sided hydroureteronephrosis without obstructing calculus, suspected to have recently passed stone or an ascending UTI.  It also showed asymmetric bladder wall thickening. Blood culture and urine culture were collected Patient was started on IV Rocephin  Admitted to [facility] Urine culture sent on admission grew more than 100,000 CFU per mL of Proteus mirabilis and 80,000 CFU per mL of Klebsiella pneumoniae Blood culture grew Staph hominis in aerobic bottle only, likely contamination  Subjective: Patient was seen and examined this am. Elderly Caucasian female.  Sitting up in recliner.  Not in distress.  Husband at bedside. Seen by PT.  Physical strength better than yesterday.  Patient and family are looking forward for home with home health.  Hospital course: Sepsis -POA Left  pyelonephritis Presented with abdominal pain, NVD Had fever, tachycardia CT scan with evidence of pyelonephritis-likely ascending UTI, no evidence of stone Urine culture sent on admission grew more than 100,000 CFU per mL of Proteus mirabilis and 80,000 CFU per mL of Klebsiella pneumoniae Responded to IV Rocephin  Subsequently switched to oral Keflex  which she is currently on.  I would continue it for next 1 week  Post discharge, she needs to follow-up with urology after discharge  for nonemergent CT urogram to exclude underlying neoplasm. Recent Labs  Lab 01/02/24 1939 01/03/24 0021 01/04/24 0448 01/05/24 0619 01/06/24 0537 01/08/24 0507  WBC  --  11.5* 7.8 5.3 5.2 5.9  LATICACIDVEN 1.7  --   --   --   --   --    Recent C. difficile colitis Had diarrhea this presentation as well but improved before stool sample could be collected for C. difficile. Diarrhea has improved. Previous hospitalist reached out to ID who recommended to treat with oral vancomycin  125 milligram twice daily while on antibiotics and for 7 days after completion for prevention.  ?  Oral thrush - Patient complains that she feels she has oral thrush due to antibiotics.  Asking for antifungal.  Only minimal exudates seen on oral exam today.  I have started her on a course of nystatin swish and swallow for the duration of antibiotics.  AKI on CKD 3A  Creatinine was elevated 1.64 at presentation due to dehydration.  Improved back to baseline.   Recent Labs    05/26/23 0311 06/26/23 1023 07/12/23 0710 12/21/23 1002 01/02/24 1900 01/03/24 0021 01/04/24 0448 01/05/24 0619 01/06/24 0537 01/08/24 0507  BUN 11 20 33* 17 25* 25* 20 16 13 11   CREATININE 0.98 1.46* 1.90*  1.26* 1.64* 1.30* 1.41* 1.34* 1.20* 1.20*   Hypokalemia Potassium replacement was given Recent Labs  Lab 01/03/24 0021 01/04/24 0448 01/05/24 0619 01/06/24 0537 01/08/24 0507  K 2.9* 3.9 3.8 3.4* 3.8  MG 1.8  --   --   --   --    Atrial  fibrillation  Continue Eliquis , bisoprolol      H/o CVA  Eliquis , Lipitor     Non-small cell lung cancer   S/p SBRT to RML nodule in July 2024  Due for follow-up with Dr. Marguerita Shih    Acute on chronic anemia Chronic iron deficiency anemia No active blood loss.  Hemoglobin slightly low between 9 and 10. In 2024 October, ferritin level was significantly low at 10.  Repeat iron profile shows improvement in ferritin levels.  Continue iron supplement Recent Labs    05/25/23 0311 05/26/23 0311 01/03/24 0021 01/04/24 0448 01/05/24 0619 01/06/24 0537 01/07/24 1246 01/08/24 0507  HGB 11.5*   < > 10.6* 10.6* 11.9* 9.6*  --  9.8*  MCV 97.5   < > 93.7 94.7 90.8 91.7  --  90.9  VITAMINB12 218  --   --   --   --   --  616  --   FOLATE 10.0  --   --   --   --   --  9.3  --   FERRITIN 10*  --   --   --   --   --  62  --   TIBC 340  --   --   --   --   --  295  --   IRON 34  --   --   --   --   --  34  --   RETICCTPCT 3.2*  --   --   --   --   --  0.7  --    < > = values in this interval not displayed.   Hypothyroidism  Synthroid    Impaired mobility Seen by PT. Home with home health PT   Goals of care   Code Status: Full Code   Diet:  Diet Order             Diet general           Diet regular Room service appropriate? Yes; Fluid consistency: Thin  Diet effective now                   Nutritional status:  Body mass index is 20.66 kg/m.       Wounds: -    Discharge Exam:   Vitals:   01/07/24 1312 01/07/24 2058 01/08/24 0503 01/08/24 1311  BP: (!) 153/99 134/73 (!) 155/93 117/69  Pulse: 92 77 73 94  Resp: 18 20 20 18   Temp: 98.2 F (36.8 C) 97.6 F (36.4 C) 98.4 F (36.9 C) 98.5 F (36.9 C)  TempSrc: Oral Oral Oral Oral  SpO2: 99% 96% 98% 98%  Weight:      Height:        Body mass index is 20.66 kg/m.  General exam: Pleasant, elderly, not in distress Skin: No rashes, lesions or ulcers. HEENT: Atraumatic, normocephalic, no obvious bleeding Lungs:  Clear to auscultation bilaterally CVS: S1, S2, no murmur GI/Abd: Soft, nontender, nondistended, bowel sound present, CNS: Alert, awake, and x 3 Psychiatry: Mood appropriate,  Extremities: No pedal edema, no calf tenderness  Follow ups:    Follow-up Information     [facility] Follow up.  Specialty: Home Health Services Why: [facility] physical/occupational therapy Contact information: [address] [place] [address] [phone]         Lonzie Robins, MD Follow up.   Specialty: Internal Medicine Contact information: [address] [place] [address] [phone]                 Discharge Instructions:   Discharge Instructions     Call MD for:  difficulty breathing, headache or visual disturbances   Complete by: As directed    Call MD for:  extreme fatigue   Complete by: As directed    Call MD for:  hives   Complete by: As directed    Call MD for:  persistant dizziness or light-headedness   Complete by: As directed    Call MD for:  persistant nausea and vomiting   Complete by: As directed    Call MD for:  severe uncontrolled pain   Complete by: As directed    Call MD for:  temperature >100.4   Complete by: As directed    Diet general   Complete by: As directed    Discharge instructions   Complete by: As directed    Recommendations at discharge:   Continue Keflex  for 1 more week to complete the course of antibiotics  Continue oral vancomycin  for the duration of Keflex  and 1 more week following completion.  Follow-up with urology after discharge  for nonemergent CT urogram to exclude underlying neoplasm.  General discharge instructions: Follow with Primary MD Lonzie Robins, MD in 7 days  Please request your PCP  to go over your hospital tests, procedures, radiology results at the follow up. Please get your medicines reviewed and adjusted.  Your PCP may decide to repeat certain labs or tests as needed. Do not drive,  operate heavy machinery, perform activities at heights, swimming or participation in water  activities or provide baby sitting services if your were admitted for syncope or siezures until you have seen by Primary MD or a Neurologist and advised to do so again. [place]  Controlled Substance Reporting System database was reviewed. Do not drive, operate heavy machinery, perform activities at heights, swim, participate in water  activities or provide baby-sitting services while on medications for pain, sleep and mood until your outpatient physician has reevaluated you and advised to do so again.  You are strongly recommended to comply with the dose, frequency and duration of prescribed medications. Activity: As tolerated with Full fall precautions use walker/cane & assistance as needed Avoid using any recreational substances like cigarette, tobacco, alcohol , or non-prescribed drug. If you experience worsening of your admission symptoms, develop shortness of breath, life threatening emergency, suicidal or homicidal thoughts you must seek medical attention immediately by calling 911 or calling your MD immediately  if symptoms less severe. You must read complete instructions/literature along with all the possible adverse reactions/side effects for all the medicines you take and that have been prescribed to you. Take any new medicine only after you have completely understood and accepted all the possible adverse reactions/side effects.  Wear Seat belts while driving. You were cared for by a hospitalist during your hospital stay. If you have any questions about your discharge medications or the care you received while you were in the hospital after you are discharged, you can call the unit and ask to speak with the hospitalist or the covering physician. Once you are discharged, your primary care physician will handle any further medical issues. Please note that  NO REFILLS for any discharge medications will be  authorized once you are discharged, as it is imperative that you return to your primary care physician (or establish a relationship with a primary care physician if you do not have one).   Increase activity slowly   Complete by: As directed        Discharge Medications:   Allergies as of 01/08/2024       Reactions   Penicillins Rash        Medication List     TAKE these medications    acetaminophen  325 MG tablet Commonly known as: TYLENOL  Take 2 tablets (650 mg total) by mouth every 8 (eight) hours. What changed: when to take this   apixaban  5 MG Tabs tablet Commonly known as: ELIQUIS  Take 1 tablet (5 mg total) by mouth 2 (two) times daily.   ascorbic acid 500 MG tablet Commonly known as: VITAMIN C Take 500 mg by mouth daily.   atorvastatin  10 MG tablet Commonly known as: LIPITOR Take 1 tablet (10 mg total) by mouth daily.   bisoprolol  5 MG tablet Commonly known as: ZEBETA  Take 2.5 mg by mouth daily.   buPROPion  75 MG tablet Commonly known as: WELLBUTRIN  Take 75 mg by mouth every morning.   cephALEXin  500 MG capsule Commonly known as: KEFLEX  Take 1 capsule (500 mg total) by mouth every 8 (eight) hours for 7 days.   ferrous sulfate  325 (65 FE) MG tablet Take 1 tablet (325 mg total) by mouth daily with breakfast. Start taking on: Jan 09, 2024   FLUoxetine  10 MG capsule Commonly known as: PROZAC  Take 20 mg by mouth at bedtime.   FLUoxetine  20 MG capsule Commonly known as: PROZAC  Take 1 capsule (20 mg total) by mouth at bedtime.   gabapentin  600 MG tablet Commonly known as: NEURONTIN  Take 600 mg by mouth in the morning and at bedtime.   levothyroxine  88 MCG tablet Commonly known as: Synthroid  Take 1 tablet (88 mcg total) by mouth daily before breakfast.   loratadine  10 MG tablet Commonly known as: CLARITIN  Take 10 mg by mouth daily.   nystatin 100000 UNIT/ML suspension Commonly known as: MYCOSTATIN Take 5 mLs (500,000 Units total) by mouth 4  (four) times daily for 7 days.   omeprazole  20 MG capsule Commonly known as: PRILOSEC  Take 1 capsule (20 mg total) by mouth 2 (two) times daily before a meal.   ondansetron  4 MG disintegrating tablet Commonly known as: ZOFRAN -ODT Take 1 tablet (4 mg total) by mouth every 8 (eight) hours as needed for nausea or vomiting.   saccharomyces boulardii 250 MG capsule Commonly known as: Florastor Take 1 capsule (250 mg total) by mouth 2 (two) times daily.   vancomycin  125 MG capsule Commonly known as: VANCOCIN  Take 1 capsule (125 mg total) by mouth 2 (two) times daily for 14 days. Notes to patient: This Medication is to be taken 2x daily for 14 Days    Vitamin D 125 MCG (5000 UT) Caps Take 5,000 Units by mouth daily.         The results of significant diagnostics from this hospitalization (including imaging, microbiology, ancillary and laboratory) are listed below for reference.    Procedures and Diagnostic Studies:   DG Chest Port 1 View Result Date: 01/02/2024 CLINICAL DATA:  Sepsis EXAM: PORTABLE CHEST 1 VIEW COMPARISON:  02/20/2023 FINDINGS: Focal consolidation within the right mid lung zone is present, similar to that noted on CT examination of 12/21/2023. No pneumothorax or pleural  effusion. Cardiac size is mildly enlarged, unchanged. Pulmonary vascularity is normal. No acute bone abnormality. IMPRESSION: 1. Focal consolidation within the right mid lung zone, similar to that noted on CT examination of 12/21/2023. Electronically Signed   By: Worthy Heads M.D.   On: 01/02/2024 23:10   CT ABDOMEN PELVIS W CONTRAST Result Date: 01/02/2024 CLINICAL DATA:  Abdominal pain, acute, nonlocalized EXAM: CT ABDOMEN AND PELVIS WITH CONTRAST TECHNIQUE: Multidetector CT imaging of the abdomen and pelvis was performed using the standard protocol following bolus administration of intravenous contrast. RADIATION DOSE REDUCTION: This exam was performed according to the departmental dose-optimization  program which includes automated exposure control, adjustment of the mA and/or kV according to patient size and/or use of iterative reconstruction technique. CONTRAST:  80mL OMNIPAQUE  IOHEXOL  300 MG/ML  SOLN COMPARISON:  May 22, 2023, February 11, 2019 FINDINGS: Lower chest: No focal airspace consolidation or pleural effusion.Mild cardiomegaly with right atrial predominance. Hepatobiliary: Multiple small cysts scattered throughout both lobes of the liver. A large lesion in the left hepatic lobe with chunky calcification, measuring 5 cm, is unchanged, likely a hemangioma given the stability. Subtle arterially enhancing lesion in the posterior right hepatic lobe (axial 19), also likely a small flash filling hemangioma. Cholecystectomy. Mild dilation of the intrahepatic and extrahepatic bile ducts, likely related to the prior cholecystectomy. The portal veins are patent. Pancreas: No mass. Unchanged prominence of the main pancreatic duct, possibly age-related ectasia. No peripancreatic inflammation or fluid collection. Spleen: Normal size. No mass. Adrenals/Urinary Tract: No adrenal masses. The right kidney is not visualized. No left renal mass. Moderate left-sided hydroureteronephrosis. No visualized ureteral calculus. Mild left-sided perinephric stranding. Asymmetric wall thickening along the posterior bladder. Stomach/Bowel: Moderate-sized sliding-type hiatal hernia. The stomach is decompressed without focal abnormality. No small bowel wall thickening or inflammation. No small bowel obstruction.The appendix was not visualized. No right lower quadrant or pericecal inflammatory changes to suggest acute appendicitis. Vascular/Lymphatic: No aortic aneurysm. Diffuse aortoiliac atherosclerosis. No intraabdominal or pelvic lymphadenopathy. Reproductive: Hysterectomy. No concerning adnexal mass.no free pelvic fluid. Other: No pneumoperitoneum or ascites Musculoskeletal: No acute fracture or destructive lesion. Osteopenia.  Right hip arthroplasty is anatomically aligned without dislocation. Lumbar fusion hardware at L3-L4, unchanged. Multilevel degenerative disc disease of the spine. Moderate left hip osteoarthritis. IMPRESSION: 1. Moderate left-sided hydroureteronephrosis without obstructive ureteral calculus visualized. Given the left perinephric stranding, this may represent changes from a recently passed calculus or an ascending urinary tract infection. Correlation with urinalysis recommended. 2. Asymmetric wall thickening along the posterior aspect of the urinary bladder, possibly due to acute cystitis. Nonemergent CT urogram (with metallic artifact reduction technique) should also be considered to exclude underlying neoplasm. 3. Moderate-sized hiatal hernia. Electronically Signed   By: Rance Burrows M.D.   On: 01/02/2024 21:57     Labs:   Basic Metabolic Panel: Recent Labs  Lab 01/03/24 0021 01/04/24 0448 01/05/24 0619 01/06/24 0537 01/08/24 0507  NA 130* 131* 138 137 139  K 2.9* 3.9 3.8 3.4* 3.8  CL 99 105 109 110 108  CO2 20* 17* 20* 21* 25  GLUCOSE 130* 89 97 97 97  BUN 25* 20 16 13 11   CREATININE 1.30* 1.41* 1.34* 1.20* 1.20*  CALCIUM  9.2 9.5 9.9 9.4 9.6  MG 1.8  --   --   --   --    GFR Estimated Creatinine Clearance: 35.4 mL/min (A) (by C-G formula based on SCr of 1.2 mg/dL (H)). Liver Function Tests: Recent Labs  Lab 01/02/24 1900  AST 21  ALT 17  ALKPHOS 86  BILITOT 1.1  PROT 7.3  ALBUMIN 3.3*   Recent Labs  Lab 01/02/24 1900  LIPASE 25   No results for input(s): "AMMONIA" in the last 168 hours. Coagulation profile Recent Labs  Lab 01/03/24 0021  INR 1.5*    CBC: Recent Labs  Lab 01/02/24 1900 01/03/24 0021 01/04/24 0448 01/05/24 0619 01/06/24 0537 01/08/24 0507  WBC 13.3* 11.5* 7.8 5.3 5.2 5.9  NEUTROABS 11.5*  --   --   --  3.4 3.3  HGB 12.0 10.6* 10.6* 11.9* 9.6* 9.8*  HCT 38.2 35.7* 35.9* 38.6 32.0* 31.9*  MCV 88.6 93.7 94.7 90.8 91.7 90.9  PLT 281 257  234 257 316 393   Cardiac Enzymes: No results for input(s): "CKTOTAL", "CKMB", "CKMBINDEX", "TROPONINI" in the last 168 hours. BNP: Invalid input(s): "POCBNP" CBG: No results for input(s): "GLUCAP" in the last 168 hours. D-Dimer No results for input(s): "DDIMER" in the last 72 hours. Hgb A1c No results for input(s): "HGBA1C" in the last 72 hours. Lipid Profile No results for input(s): "CHOL", "HDL", "LDLCALC", "TRIG", "CHOLHDL", "LDLDIRECT" in the last 72 hours. Thyroid  function studies No results for input(s): "TSH", "T4TOTAL", "T3FREE", "THYROIDAB" in the last 72 hours.  Invalid input(s): "FREET3" Anemia work up Recent Labs    01/07/24 1246  VITAMINB12 616  FOLATE 9.3  FERRITIN 62  TIBC 295  IRON 34  RETICCTPCT 0.7   Microbiology Recent Results (from the past 240 hours)  Urine Culture     Status: Abnormal   Collection Time: 01/02/24  7:39 PM   Specimen: Urine, Clean Catch  Result Value Ref Range Status   Specimen Description   Final    URINE, CLEAN CATCH Performed at [facility], [address] [place], [place] [address]    Special Requests   Final    NONE Performed at [facility], [address] [place], [place] [address]    Culture (A)  Final    >=100,000 COLONIES/mL PROTEUS MIRABILIS 80,000 COLONIES/mL KLEBSIELLA PNEUMONIAE    Report Status 01/05/2024 FINAL  Final   Organism ID, Bacteria PROTEUS MIRABILIS (A)  Final   Organism ID, Bacteria KLEBSIELLA PNEUMONIAE (A)  Final      Susceptibility   Klebsiella pneumoniae - MIC*    AMPICILLIN >=32 RESISTANT Resistant     CEFAZOLIN <=4 SENSITIVE Sensitive     CEFEPIME  <=0.12 SENSITIVE Sensitive     CEFTRIAXONE  <=0.25 SENSITIVE Sensitive     CIPROFLOXACIN  <=0.25 SENSITIVE Sensitive     GENTAMICIN <=1 SENSITIVE Sensitive     IMIPENEM <=0.25 SENSITIVE Sensitive     NITROFURANTOIN 32 SENSITIVE Sensitive     TRIMETH/SULFA <=20 SENSITIVE Sensitive     AMPICILLIN/SULBACTAM 4  SENSITIVE Sensitive     PIP/TAZO <=4 SENSITIVE Sensitive ug/mL    * 80,000 COLONIES/mL KLEBSIELLA PNEUMONIAE   Proteus mirabilis - MIC*    AMPICILLIN <=2 SENSITIVE Sensitive     CEFAZOLIN <=4 SENSITIVE Sensitive     CEFEPIME  <=0.12 SENSITIVE Sensitive     CEFTRIAXONE  <=0.25 SENSITIVE Sensitive     CIPROFLOXACIN  <=0.25 SENSITIVE Sensitive     GENTAMICIN <=1 SENSITIVE Sensitive     IMIPENEM 2 SENSITIVE Sensitive     NITROFURANTOIN 128 RESISTANT Resistant     TRIMETH/SULFA <=20 SENSITIVE Sensitive     AMPICILLIN/SULBACTAM <=2 SENSITIVE Sensitive     PIP/TAZO <=4 SENSITIVE Sensitive ug/mL    * >=100,000 COLONIES/mL PROTEUS MIRABILIS  Resp panel by RT-PCR (RSV, Flu A&B, Covid)  Anterior Nasal Swab     Status: None   Collection Time: 01/02/24 11:42 PM   Specimen: Anterior Nasal Swab  Result Value Ref Range Status   SARS Coronavirus 2 by RT PCR NEGATIVE NEGATIVE Final    Comment: (NOTE) SARS-CoV-2 target nucleic acids are NOT DETECTED.  The SARS-CoV-2 RNA is generally detectable in upper respiratory specimens during the acute phase of infection. The lowest concentration of SARS-CoV-2 viral copies this assay can detect is 138 copies/mL. A negative result does not preclude SARS-Cov-2 infection and should not be used as the sole basis for treatment or other patient management decisions. A negative result may occur with  improper specimen collection/handling, submission of specimen other than nasopharyngeal swab, presence of viral mutation(s) within the areas targeted by this assay, and inadequate number of viral copies(<138 copies/mL). A negative result must be combined with clinical observations, patient history, and epidemiological information. The expected result is Negative.  Fact Sheet for Patients:  BloggerCourse.com  Fact Sheet for Healthcare Providers:  SeriousBroker.it  This test is no t yet approved or cleared by the [place] FDA and  has been authorized for detection and/or diagnosis of SARS-CoV-2 by FDA under an Emergency Use Authorization (EUA). This EUA will remain  in effect (meaning this test can be used) for the duration of the COVID-19 declaration under Section 564(b)(1) of the Act, 21 U.S.C.section 360bbb-3(b)(1), unless the authorization is terminated  or revoked sooner.       Influenza A by PCR NEGATIVE NEGATIVE Final   Influenza B by PCR NEGATIVE NEGATIVE Final    Comment: (NOTE) The Xpert Xpress SARS-CoV-2/FLU/RSV plus assay is intended as an aid in the diagnosis of influenza from Nasopharyngeal swab specimens and should not be used as a sole basis for treatment. Nasal washings and aspirates are unacceptable for Xpert Xpress SARS-CoV-2/FLU/RSV testing.  Fact Sheet for Patients: BloggerCourse.com  Fact Sheet for Healthcare Providers: SeriousBroker.it  This test is not yet approved or cleared by the [place]  FDA and has been authorized for detection and/or diagnosis of SARS-CoV-2 by FDA under an Emergency Use Authorization (EUA). This EUA will remain in effect (meaning this test can be used) for the duration of the COVID-19 declaration under Section 564(b)(1) of the Act, 21 U.S.C. section 360bbb-3(b)(1), unless the authorization is terminated or revoked.     Resp Syncytial Virus by PCR NEGATIVE NEGATIVE Final    Comment: (NOTE) Fact Sheet for Patients: BloggerCourse.com  Fact Sheet for Healthcare Providers: SeriousBroker.it  This test is not yet approved or cleared by the [place]  FDA and has been authorized for detection and/or diagnosis of SARS-CoV-2 by FDA under an Emergency Use Authorization (EUA). This EUA will remain in effect (meaning this test can be used) for the duration of the COVID-19 declaration under Section 564(b)(1) of the Act, 21 U.S.C. section  360bbb-3(b)(1), unless the authorization is terminated or revoked.  Performed at [facility], [address] [place], [place] [address]   Blood Culture (routine x 2)     Status: None   Collection Time: 01/03/24 12:21 AM   Specimen: BLOOD  Result Value Ref Range Status   Specimen Description   Final    BLOOD LEFT ANTECUBITAL Performed at [facility], [address], [place], [place] [address]    Special Requests   Final    BOTTLES DRAWN AEROBIC AND ANAEROBIC Blood Culture adequate volume Performed at [facility], [address], [place], [place] [address]  Culture   Final    NO GROWTH 5 DAYS Performed at [facility], [address] [place], [place] [address]    Report Status 01/08/2024 FINAL  Final  Blood Culture (routine x 2)     Status: Abnormal   Collection Time: 01/03/24 12:21 AM   Specimen: BLOOD RIGHT ARM  Result Value Ref Range Status   Specimen Description   Final    BLOOD RIGHT ARM Performed at [facility], [address] [place], [place] [address]    Special Requests   Final    BOTTLES DRAWN AEROBIC ONLY Blood Culture results may not be optimal due to an inadequate volume of blood received in culture bottles Performed at [facility], [address], [place], [place] [address]    Culture  Setup Time   Final    GRAM POSITIVE COCCI AEROBIC BOTTLE ONLY CRITICAL RESULT CALLED TO, READ BACK BY AND VERIFIED WITH: PHARMD E JACKSON 01/04/2024 @ 0134 BY AB    Culture (A)  Final    STAPHYLOCOCCUS HOMINIS THE SIGNIFICANCE OF ISOLATING THIS ORGANISM FROM A SINGLE SET OF BLOOD CULTURES WHEN MULTIPLE SETS ARE DRAWN IS UNCERTAIN. PLEASE NOTIFY THE MICROBIOLOGY DEPARTMENT WITHIN ONE WEEK IF SPECIATION AND SENSITIVITIES ARE REQUIRED. Performed at [facility], [address], [place], [place] [address]    Report Status 01/05/2024 FINAL  Final  Blood Culture ID Panel (Reflexed)      Status: Abnormal   Collection Time: 01/03/24 12:21 AM  Result Value Ref Range Status   Enterococcus faecalis NOT DETECTED NOT DETECTED Final   Enterococcus Faecium NOT DETECTED NOT DETECTED Final   Listeria monocytogenes NOT DETECTED NOT DETECTED Final   Staphylococcus species DETECTED (A) NOT DETECTED Final    Comment: CRITICAL RESULT CALLED TO, READ BACK BY AND VERIFIED WITH: PHARMD E JACKSON 01/04/2024 @ 0134 BY AB    Staphylococcus aureus (BCID) NOT DETECTED NOT DETECTED Final   Staphylococcus epidermidis NOT DETECTED NOT DETECTED Final   Staphylococcus lugdunensis NOT DETECTED NOT DETECTED Final   Streptococcus species NOT DETECTED NOT DETECTED Final   Streptococcus agalactiae NOT DETECTED NOT DETECTED Final   Streptococcus pneumoniae NOT DETECTED NOT DETECTED Final   Streptococcus pyogenes NOT DETECTED NOT DETECTED Final   A.calcoaceticus-baumannii NOT DETECTED NOT DETECTED Final   Bacteroides fragilis NOT DETECTED NOT DETECTED Final   Enterobacterales NOT DETECTED NOT DETECTED Final   Enterobacter cloacae complex NOT DETECTED NOT DETECTED Final   Escherichia coli NOT DETECTED NOT DETECTED Final   Klebsiella aerogenes NOT DETECTED NOT DETECTED Final   Klebsiella oxytoca NOT DETECTED NOT DETECTED Final   Klebsiella pneumoniae NOT DETECTED NOT DETECTED Final   Proteus species NOT DETECTED NOT DETECTED Final   Salmonella species NOT DETECTED NOT DETECTED Final   Serratia marcescens NOT DETECTED NOT DETECTED Final   Haemophilus influenzae NOT DETECTED NOT DETECTED Final   Neisseria meningitidis NOT DETECTED NOT DETECTED Final   Pseudomonas aeruginosa NOT DETECTED NOT DETECTED Final   Stenotrophomonas maltophilia NOT DETECTED NOT DETECTED Final   Candida albicans NOT DETECTED NOT DETECTED Final   Candida auris NOT DETECTED NOT DETECTED Final   Candida glabrata NOT DETECTED NOT DETECTED Final   Candida krusei NOT DETECTED NOT DETECTED Final   Candida parapsilosis NOT DETECTED  NOT DETECTED Final   Candida tropicalis NOT DETECTED NOT DETECTED Final   Cryptococcus neoformans/gattii NOT DETECTED NOT DETECTED Final    Comment: Performed at [facility], [address], [place], [place] [address]    Time coordinating discharge: 45 minutes  Signed: Annalee Meyerhoff  [facility] 01/08/2024, 2:11 PM

## 2024-01-08 NOTE — TOC Transition Note (Addendum)
 Transition of Care [facility]) - Discharge Note   Patient Details  Name: [name] MRN: [id number] Date of Birth: Mar 12, 1946  Transition of Care [facility]) CM/SW Contact:  Ruben Corolla, RN Phone Number: 01/08/2024, 12:30 PM   Clinical Narrative:Per PT now recc HHC. Patient agrees.MD agrees.Patient recc HHPT/OT-used [facility] in past rep Brandi/Kelly accepted. Has own transport. No further CM needs.       Final next level of care: Home w Home Health Services Barriers to Discharge: No Barriers Identified   Patient Goals and CMS Choice Patient states their goals for this hospitalization and ongoing recovery are:: Home CMS Medicare.gov Compare Post Acute Care list provided to:: Patient Choice offered to / list presented to : Patient [place] ownership interest in [facility].provided to:: Patient    Discharge Placement                       Discharge Plan and Services Additional resources added to the After Visit Summary for     Discharge Planning Services: CM Consult Post Acute Care Choice: Home Health                    HH Arranged: PT, OT [facility] Agency: [facility] Date [facility] Agency Contacted: 01/08/24 Time HH Agency Contacted: 1229 Representative spoke with at [facility] Agency: Brandi/Kelly  Social Drivers of Health (SDOH) Interventions SDOH Screenings   Food Insecurity: No Food Insecurity (01/03/2024)  Housing: Low Risk  (01/03/2024)  Transportation Needs: No Transportation Needs (01/03/2024)  Utilities: Not At Risk (01/03/2024)  Depression (PHQ2-9): Low Risk  (02/28/2023)  Social Connections: Socially Integrated (01/04/2024)  Tobacco Use: Medium Risk (01/02/2024)     Readmission Risk Interventions    01/03/2024   12:11 PM 05/23/2023   10:20 AM  Readmission Risk Prevention Plan  Transportation Screening Complete Complete  PCP or Specialist Appt within 5-7 Days  Complete  PCP or Specialist Appt within 3-5 Days Complete   Home Care Screening   Complete  Medication Review (RN CM)  Complete  HRI or Home Care Consult Complete   Social Work Consult for Recovery Care Planning/Counseling Complete   Palliative Care Screening Complete   Medication Review Oceanographer) Complete

## 2024-01-09 DIAGNOSIS — N10 Acute tubulo-interstitial nephritis: Secondary | ICD-10-CM | POA: Diagnosis not present

## 2024-01-09 DIAGNOSIS — C3490 Malignant neoplasm of unspecified part of unspecified bronchus or lung: Secondary | ICD-10-CM | POA: Diagnosis not present

## 2024-01-09 DIAGNOSIS — I129 Hypertensive chronic kidney disease with stage 1 through stage 4 chronic kidney disease, or unspecified chronic kidney disease: Secondary | ICD-10-CM | POA: Diagnosis not present

## 2024-01-09 DIAGNOSIS — J449 Chronic obstructive pulmonary disease, unspecified: Secondary | ICD-10-CM | POA: Diagnosis not present

## 2024-01-09 DIAGNOSIS — A419 Sepsis, unspecified organism: Secondary | ICD-10-CM | POA: Diagnosis not present

## 2024-01-09 DIAGNOSIS — N1831 Chronic kidney disease, stage 3a: Secondary | ICD-10-CM | POA: Diagnosis not present

## 2024-01-09 DIAGNOSIS — I2510 Atherosclerotic heart disease of native coronary artery without angina pectoris: Secondary | ICD-10-CM | POA: Diagnosis not present

## 2024-01-09 DIAGNOSIS — N179 Acute kidney failure, unspecified: Secondary | ICD-10-CM | POA: Diagnosis not present

## 2024-01-09 DIAGNOSIS — I4891 Unspecified atrial fibrillation: Secondary | ICD-10-CM | POA: Diagnosis not present

## 2024-01-10 DIAGNOSIS — N1831 Chronic kidney disease, stage 3a: Secondary | ICD-10-CM | POA: Diagnosis not present

## 2024-01-10 DIAGNOSIS — I4891 Unspecified atrial fibrillation: Secondary | ICD-10-CM | POA: Diagnosis not present

## 2024-01-10 DIAGNOSIS — C3490 Malignant neoplasm of unspecified part of unspecified bronchus or lung: Secondary | ICD-10-CM | POA: Diagnosis not present

## 2024-01-10 DIAGNOSIS — N10 Acute tubulo-interstitial nephritis: Secondary | ICD-10-CM | POA: Diagnosis not present

## 2024-01-10 DIAGNOSIS — A419 Sepsis, unspecified organism: Secondary | ICD-10-CM | POA: Diagnosis not present

## 2024-01-10 DIAGNOSIS — J449 Chronic obstructive pulmonary disease, unspecified: Secondary | ICD-10-CM | POA: Diagnosis not present

## 2024-01-10 DIAGNOSIS — N179 Acute kidney failure, unspecified: Secondary | ICD-10-CM | POA: Diagnosis not present

## 2024-01-10 DIAGNOSIS — I2510 Atherosclerotic heart disease of native coronary artery without angina pectoris: Secondary | ICD-10-CM | POA: Diagnosis not present

## 2024-01-10 DIAGNOSIS — I129 Hypertensive chronic kidney disease with stage 1 through stage 4 chronic kidney disease, or unspecified chronic kidney disease: Secondary | ICD-10-CM | POA: Diagnosis not present

## 2024-01-15 ENCOUNTER — Other Ambulatory Visit: Payer: Self-pay | Admitting: Internal Medicine

## 2024-01-15 ENCOUNTER — Inpatient Hospital Stay: Admitting: Internal Medicine

## 2024-01-15 DIAGNOSIS — N10 Acute tubulo-interstitial nephritis: Secondary | ICD-10-CM | POA: Diagnosis not present

## 2024-01-15 DIAGNOSIS — I2510 Atherosclerotic heart disease of native coronary artery without angina pectoris: Secondary | ICD-10-CM | POA: Diagnosis not present

## 2024-01-15 DIAGNOSIS — A419 Sepsis, unspecified organism: Secondary | ICD-10-CM | POA: Diagnosis not present

## 2024-01-15 DIAGNOSIS — N1831 Chronic kidney disease, stage 3a: Secondary | ICD-10-CM | POA: Diagnosis not present

## 2024-01-15 DIAGNOSIS — C3490 Malignant neoplasm of unspecified part of unspecified bronchus or lung: Secondary | ICD-10-CM

## 2024-01-15 DIAGNOSIS — N179 Acute kidney failure, unspecified: Secondary | ICD-10-CM | POA: Diagnosis not present

## 2024-01-15 DIAGNOSIS — I129 Hypertensive chronic kidney disease with stage 1 through stage 4 chronic kidney disease, or unspecified chronic kidney disease: Secondary | ICD-10-CM | POA: Diagnosis not present

## 2024-01-15 DIAGNOSIS — J449 Chronic obstructive pulmonary disease, unspecified: Secondary | ICD-10-CM | POA: Diagnosis not present

## 2024-01-15 DIAGNOSIS — I4891 Unspecified atrial fibrillation: Secondary | ICD-10-CM | POA: Diagnosis not present

## 2024-01-16 DIAGNOSIS — I2510 Atherosclerotic heart disease of native coronary artery without angina pectoris: Secondary | ICD-10-CM | POA: Diagnosis not present

## 2024-01-16 DIAGNOSIS — I129 Hypertensive chronic kidney disease with stage 1 through stage 4 chronic kidney disease, or unspecified chronic kidney disease: Secondary | ICD-10-CM | POA: Diagnosis not present

## 2024-01-16 DIAGNOSIS — N10 Acute tubulo-interstitial nephritis: Secondary | ICD-10-CM | POA: Diagnosis not present

## 2024-01-16 DIAGNOSIS — A419 Sepsis, unspecified organism: Secondary | ICD-10-CM | POA: Diagnosis not present

## 2024-01-16 DIAGNOSIS — N179 Acute kidney failure, unspecified: Secondary | ICD-10-CM | POA: Diagnosis not present

## 2024-01-16 DIAGNOSIS — C3490 Malignant neoplasm of unspecified part of unspecified bronchus or lung: Secondary | ICD-10-CM | POA: Diagnosis not present

## 2024-01-16 DIAGNOSIS — J449 Chronic obstructive pulmonary disease, unspecified: Secondary | ICD-10-CM | POA: Diagnosis not present

## 2024-01-16 DIAGNOSIS — I4891 Unspecified atrial fibrillation: Secondary | ICD-10-CM | POA: Diagnosis not present

## 2024-01-16 DIAGNOSIS — N1831 Chronic kidney disease, stage 3a: Secondary | ICD-10-CM | POA: Diagnosis not present

## 2024-01-17 DIAGNOSIS — J449 Chronic obstructive pulmonary disease, unspecified: Secondary | ICD-10-CM | POA: Diagnosis not present

## 2024-01-17 DIAGNOSIS — A498 Other bacterial infections of unspecified site: Secondary | ICD-10-CM | POA: Diagnosis not present

## 2024-01-17 DIAGNOSIS — N10 Acute tubulo-interstitial nephritis: Secondary | ICD-10-CM | POA: Diagnosis not present

## 2024-01-17 DIAGNOSIS — N1831 Chronic kidney disease, stage 3a: Secondary | ICD-10-CM | POA: Diagnosis not present

## 2024-01-17 DIAGNOSIS — R6520 Severe sepsis without septic shock: Secondary | ICD-10-CM | POA: Diagnosis not present

## 2024-01-17 DIAGNOSIS — C3490 Malignant neoplasm of unspecified part of unspecified bronchus or lung: Secondary | ICD-10-CM | POA: Diagnosis not present

## 2024-01-17 DIAGNOSIS — N12 Tubulo-interstitial nephritis, not specified as acute or chronic: Secondary | ICD-10-CM | POA: Diagnosis not present

## 2024-01-17 DIAGNOSIS — I4891 Unspecified atrial fibrillation: Secondary | ICD-10-CM | POA: Diagnosis not present

## 2024-01-17 DIAGNOSIS — A419 Sepsis, unspecified organism: Secondary | ICD-10-CM | POA: Diagnosis not present

## 2024-01-17 DIAGNOSIS — N179 Acute kidney failure, unspecified: Secondary | ICD-10-CM | POA: Diagnosis not present

## 2024-01-17 DIAGNOSIS — I129 Hypertensive chronic kidney disease with stage 1 through stage 4 chronic kidney disease, or unspecified chronic kidney disease: Secondary | ICD-10-CM | POA: Diagnosis not present

## 2024-01-17 DIAGNOSIS — I2510 Atherosclerotic heart disease of native coronary artery without angina pectoris: Secondary | ICD-10-CM | POA: Diagnosis not present

## 2024-01-22 DIAGNOSIS — C3490 Malignant neoplasm of unspecified part of unspecified bronchus or lung: Secondary | ICD-10-CM | POA: Diagnosis not present

## 2024-01-22 DIAGNOSIS — I129 Hypertensive chronic kidney disease with stage 1 through stage 4 chronic kidney disease, or unspecified chronic kidney disease: Secondary | ICD-10-CM | POA: Diagnosis not present

## 2024-01-22 DIAGNOSIS — N10 Acute tubulo-interstitial nephritis: Secondary | ICD-10-CM | POA: Diagnosis not present

## 2024-01-22 DIAGNOSIS — I4891 Unspecified atrial fibrillation: Secondary | ICD-10-CM | POA: Diagnosis not present

## 2024-01-22 DIAGNOSIS — A419 Sepsis, unspecified organism: Secondary | ICD-10-CM | POA: Diagnosis not present

## 2024-01-22 DIAGNOSIS — N179 Acute kidney failure, unspecified: Secondary | ICD-10-CM | POA: Diagnosis not present

## 2024-01-22 DIAGNOSIS — N1831 Chronic kidney disease, stage 3a: Secondary | ICD-10-CM | POA: Diagnosis not present

## 2024-01-22 DIAGNOSIS — J449 Chronic obstructive pulmonary disease, unspecified: Secondary | ICD-10-CM | POA: Diagnosis not present

## 2024-01-22 DIAGNOSIS — I2510 Atherosclerotic heart disease of native coronary artery without angina pectoris: Secondary | ICD-10-CM | POA: Diagnosis not present

## 2024-01-25 ENCOUNTER — Emergency Department (HOSPITAL_COMMUNITY)

## 2024-01-25 ENCOUNTER — Inpatient Hospital Stay (HOSPITAL_COMMUNITY)
Admission: EM | Admit: 2024-01-25 | Discharge: 2024-02-01 | DRG: 193 | Disposition: A | Discharge status: Home Health | Attending: Internal Medicine | Admitting: Internal Medicine

## 2024-01-25 ENCOUNTER — Ambulatory Visit (HOSPITAL_COMMUNITY): Admission: RE | Admit: 2024-01-25 | Source: Ambulatory Visit

## 2024-01-25 ENCOUNTER — Encounter (HOSPITAL_COMMUNITY): Payer: Self-pay | Admitting: *Deleted

## 2024-01-25 ENCOUNTER — Other Ambulatory Visit: Payer: Self-pay

## 2024-01-25 DIAGNOSIS — E7800 Pure hypercholesterolemia, unspecified: Secondary | ICD-10-CM | POA: Diagnosis present

## 2024-01-25 DIAGNOSIS — D638 Anemia in other chronic diseases classified elsewhere: Secondary | ICD-10-CM | POA: Diagnosis present

## 2024-01-25 DIAGNOSIS — R0600 Dyspnea, unspecified: Secondary | ICD-10-CM | POA: Diagnosis not present

## 2024-01-25 DIAGNOSIS — Z8619 Personal history of other infectious and parasitic diseases: Secondary | ICD-10-CM

## 2024-01-25 DIAGNOSIS — Z79899 Other long term (current) drug therapy: Secondary | ICD-10-CM

## 2024-01-25 DIAGNOSIS — R0602 Shortness of breath: Secondary | ICD-10-CM | POA: Diagnosis not present

## 2024-01-25 DIAGNOSIS — J439 Emphysema, unspecified: Secondary | ICD-10-CM | POA: Diagnosis present

## 2024-01-25 DIAGNOSIS — R059 Cough, unspecified: Secondary | ICD-10-CM | POA: Diagnosis not present

## 2024-01-25 DIAGNOSIS — I129 Hypertensive chronic kidney disease with stage 1 through stage 4 chronic kidney disease, or unspecified chronic kidney disease: Secondary | ICD-10-CM | POA: Diagnosis not present

## 2024-01-25 DIAGNOSIS — J181 Lobar pneumonia, unspecified organism: Secondary | ICD-10-CM | POA: Diagnosis not present

## 2024-01-25 DIAGNOSIS — Z96641 Presence of right artificial hip joint: Secondary | ICD-10-CM | POA: Diagnosis present

## 2024-01-25 DIAGNOSIS — N1831 Chronic kidney disease, stage 3a: Secondary | ICD-10-CM | POA: Diagnosis not present

## 2024-01-25 DIAGNOSIS — Z87891 Personal history of nicotine dependence: Secondary | ICD-10-CM | POA: Diagnosis not present

## 2024-01-25 DIAGNOSIS — Z85118 Personal history of other malignant neoplasm of bronchus and lung: Secondary | ICD-10-CM

## 2024-01-25 DIAGNOSIS — Z9049 Acquired absence of other specified parts of digestive tract: Secondary | ICD-10-CM

## 2024-01-25 DIAGNOSIS — J9601 Acute respiratory failure with hypoxia: Secondary | ICD-10-CM | POA: Diagnosis not present

## 2024-01-25 DIAGNOSIS — Z823 Family history of stroke: Secondary | ICD-10-CM | POA: Diagnosis not present

## 2024-01-25 DIAGNOSIS — Z79890 Hormone replacement therapy (postmenopausal): Secondary | ICD-10-CM | POA: Diagnosis not present

## 2024-01-25 DIAGNOSIS — I10 Essential (primary) hypertension: Secondary | ICD-10-CM | POA: Diagnosis present

## 2024-01-25 DIAGNOSIS — Z981 Arthrodesis status: Secondary | ICD-10-CM

## 2024-01-25 DIAGNOSIS — J929 Pleural plaque without asbestos: Secondary | ICD-10-CM | POA: Diagnosis not present

## 2024-01-25 DIAGNOSIS — Z923 Personal history of irradiation: Secondary | ICD-10-CM

## 2024-01-25 DIAGNOSIS — R5383 Other fatigue: Secondary | ICD-10-CM | POA: Diagnosis not present

## 2024-01-25 DIAGNOSIS — Z8719 Personal history of other diseases of the digestive system: Secondary | ICD-10-CM

## 2024-01-25 DIAGNOSIS — J9811 Atelectasis: Secondary | ICD-10-CM | POA: Diagnosis not present

## 2024-01-25 DIAGNOSIS — Z905 Acquired absence of kidney: Secondary | ICD-10-CM

## 2024-01-25 DIAGNOSIS — J129 Viral pneumonia, unspecified: Principal | ICD-10-CM | POA: Diagnosis present

## 2024-01-25 DIAGNOSIS — R531 Weakness: Secondary | ICD-10-CM | POA: Diagnosis not present

## 2024-01-25 DIAGNOSIS — I4819 Other persistent atrial fibrillation: Secondary | ICD-10-CM | POA: Diagnosis present

## 2024-01-25 DIAGNOSIS — I4821 Permanent atrial fibrillation: Secondary | ICD-10-CM | POA: Diagnosis not present

## 2024-01-25 DIAGNOSIS — M810 Age-related osteoporosis without current pathological fracture: Secondary | ICD-10-CM | POA: Diagnosis present

## 2024-01-25 DIAGNOSIS — C3490 Malignant neoplasm of unspecified part of unspecified bronchus or lung: Secondary | ICD-10-CM | POA: Diagnosis not present

## 2024-01-25 DIAGNOSIS — J189 Pneumonia, unspecified organism: Principal | ICD-10-CM

## 2024-01-25 DIAGNOSIS — R509 Fever, unspecified: Secondary | ICD-10-CM | POA: Diagnosis not present

## 2024-01-25 DIAGNOSIS — I482 Chronic atrial fibrillation, unspecified: Secondary | ICD-10-CM

## 2024-01-25 DIAGNOSIS — Z1152 Encounter for screening for COVID-19: Secondary | ICD-10-CM

## 2024-01-25 DIAGNOSIS — Z8673 Personal history of transient ischemic attack (TIA), and cerebral infarction without residual deficits: Secondary | ICD-10-CM | POA: Diagnosis not present

## 2024-01-25 DIAGNOSIS — Z90710 Acquired absence of both cervix and uterus: Secondary | ICD-10-CM

## 2024-01-25 DIAGNOSIS — Z961 Presence of intraocular lens: Secondary | ICD-10-CM | POA: Diagnosis present

## 2024-01-25 DIAGNOSIS — Z8249 Family history of ischemic heart disease and other diseases of the circulatory system: Secondary | ICD-10-CM | POA: Diagnosis not present

## 2024-01-25 DIAGNOSIS — Z7901 Long term (current) use of anticoagulants: Secondary | ICD-10-CM | POA: Diagnosis not present

## 2024-01-25 DIAGNOSIS — Z9842 Cataract extraction status, left eye: Secondary | ICD-10-CM

## 2024-01-25 DIAGNOSIS — I700 Atherosclerosis of aorta: Secondary | ICD-10-CM | POA: Diagnosis not present

## 2024-01-25 DIAGNOSIS — E876 Hypokalemia: Secondary | ICD-10-CM | POA: Diagnosis present

## 2024-01-25 DIAGNOSIS — R0902 Hypoxemia: Secondary | ICD-10-CM | POA: Diagnosis not present

## 2024-01-25 DIAGNOSIS — R0981 Nasal congestion: Secondary | ICD-10-CM | POA: Diagnosis not present

## 2024-01-25 DIAGNOSIS — J168 Pneumonia due to other specified infectious organisms: Secondary | ICD-10-CM | POA: Diagnosis not present

## 2024-01-25 DIAGNOSIS — J440 Chronic obstructive pulmonary disease with acute lower respiratory infection: Secondary | ICD-10-CM | POA: Diagnosis present

## 2024-01-25 DIAGNOSIS — E039 Hypothyroidism, unspecified: Secondary | ICD-10-CM | POA: Diagnosis not present

## 2024-01-25 DIAGNOSIS — I959 Hypotension, unspecified: Secondary | ICD-10-CM | POA: Diagnosis not present

## 2024-01-25 DIAGNOSIS — R630 Anorexia: Secondary | ICD-10-CM | POA: Diagnosis not present

## 2024-01-25 DIAGNOSIS — N1832 Chronic kidney disease, stage 3b: Secondary | ICD-10-CM | POA: Diagnosis present

## 2024-01-25 DIAGNOSIS — R918 Other nonspecific abnormal finding of lung field: Secondary | ICD-10-CM | POA: Diagnosis not present

## 2024-01-25 DIAGNOSIS — Z880 Allergy status to penicillin: Secondary | ICD-10-CM

## 2024-01-25 DIAGNOSIS — Z9841 Cataract extraction status, right eye: Secondary | ICD-10-CM

## 2024-01-25 LAB — CBC WITH DIFFERENTIAL/PLATELET
Abs Immature Granulocytes: 0.02 10*3/uL (ref 0.00–0.07)
Basophils Absolute: 0 10*3/uL (ref 0.0–0.1)
Basophils Relative: 0 %
Eosinophils Absolute: 0.1 10*3/uL (ref 0.0–0.5)
Eosinophils Relative: 1 %
HCT: 35.2 % — ABNORMAL LOW (ref 36.0–46.0)
Hemoglobin: 10.6 g/dL — ABNORMAL LOW (ref 12.0–15.0)
Immature Granulocytes: 1 %
Lymphocytes Relative: 21 %
Lymphs Abs: 0.9 10*3/uL (ref 0.7–4.0)
MCH: 27.6 pg (ref 26.0–34.0)
MCHC: 30.1 g/dL (ref 30.0–36.0)
MCV: 91.7 fL (ref 80.0–100.0)
Monocytes Absolute: 0.3 10*3/uL (ref 0.1–1.0)
Monocytes Relative: 7 %
Neutro Abs: 3 10*3/uL (ref 1.7–7.7)
Neutrophils Relative %: 70 %
Platelets: 210 10*3/uL (ref 150–400)
RBC: 3.84 MIL/uL — ABNORMAL LOW (ref 3.87–5.11)
RDW: 15 % (ref 11.5–15.5)
WBC: 4.2 10*3/uL (ref 4.0–10.5)
nRBC: 0 % (ref 0.0–0.2)

## 2024-01-25 LAB — RESP PANEL BY RT-PCR (RSV, FLU A&B, COVID)  RVPGX2
Influenza A by PCR: NEGATIVE
Influenza B by PCR: NEGATIVE
Resp Syncytial Virus by PCR: NEGATIVE
SARS Coronavirus 2 by RT PCR: NEGATIVE

## 2024-01-25 LAB — BASIC METABOLIC PANEL WITH GFR
Anion gap: 10 (ref 5–15)
BUN: 13 mg/dL (ref 8–23)
CO2: 18 mmol/L — ABNORMAL LOW (ref 22–32)
Calcium: 7.1 mg/dL — ABNORMAL LOW (ref 8.9–10.3)
Chloride: 113 mmol/L — ABNORMAL HIGH (ref 98–111)
Creatinine, Ser: 1.06 mg/dL — ABNORMAL HIGH (ref 0.44–1.00)
GFR, Estimated: 54 mL/min — ABNORMAL LOW (ref >60)
Glucose, Bld: 73 mg/dL (ref 70–99)
Potassium: 3.2 mmol/L — ABNORMAL LOW (ref 3.5–5.1)
Sodium: 141 mmol/L (ref 135–145)

## 2024-01-25 LAB — PROCALCITONIN: Procalcitonin: <0.1 ng/mL

## 2024-01-25 LAB — I-STAT CG4 LACTIC ACID, ED: Lactic Acid, Venous: 1.5 mmol/L (ref 0.5–1.9)

## 2024-01-25 MED ORDER — GUAIFENESIN ER 600 MG PO TB12
600.0000 mg | ORAL_TABLET | Freq: Two times a day (BID) | ORAL | Status: DC
  Administered 2024-01-25 – 2024-01-31 (×11): 600 mg via ORAL
  Filled 2024-01-25 (×13): qty 1

## 2024-01-25 MED ORDER — IPRATROPIUM-ALBUTEROL 0.5-2.5 (3) MG/3ML IN SOLN
3.0000 mL | Freq: Once | RESPIRATORY_TRACT | Status: AC
  Administered 2024-01-25: 3 mL via RESPIRATORY_TRACT
  Filled 2024-01-25: qty 3

## 2024-01-25 MED ORDER — POTASSIUM CHLORIDE CRYS ER 20 MEQ PO TBCR
40.0000 meq | EXTENDED_RELEASE_TABLET | Freq: Once | ORAL | Status: AC
  Administered 2024-01-25: 40 meq via ORAL
  Filled 2024-01-25: qty 2

## 2024-01-25 MED ORDER — BISOPROLOL FUMARATE 5 MG PO TABS
2.5000 mg | ORAL_TABLET | Freq: Every day | ORAL | Status: DC
  Administered 2024-01-26 – 2024-01-31 (×5): 2.5 mg via ORAL
  Filled 2024-01-25 (×6): qty 1

## 2024-01-25 MED ORDER — GABAPENTIN 300 MG PO CAPS
600.0000 mg | ORAL_CAPSULE | Freq: Every day | ORAL | Status: DC
  Administered 2024-01-25 – 2024-01-31 (×6): 600 mg via ORAL
  Filled 2024-01-25 (×7): qty 2

## 2024-01-25 MED ORDER — APIXABAN 5 MG PO TABS
5.0000 mg | ORAL_TABLET | Freq: Two times a day (BID) | ORAL | Status: DC
  Administered 2024-01-25 – 2024-01-31 (×11): 5 mg via ORAL
  Filled 2024-01-25 (×13): qty 1

## 2024-01-25 MED ORDER — ATORVASTATIN CALCIUM 10 MG PO TABS
10.0000 mg | ORAL_TABLET | Freq: Every day | ORAL | Status: DC
  Administered 2024-01-25 – 2024-01-31 (×6): 10 mg via ORAL
  Filled 2024-01-25 (×7): qty 1

## 2024-01-25 MED ORDER — BENZONATATE 100 MG PO CAPS
100.0000 mg | ORAL_CAPSULE | Freq: Three times a day (TID) | ORAL | Status: DC | PRN
  Administered 2024-01-25 – 2024-02-01 (×8): 100 mg via ORAL
  Filled 2024-01-25 (×8): qty 1

## 2024-01-25 MED ORDER — ONDANSETRON HCL 4 MG/2ML IJ SOLN
4.0000 mg | Freq: Four times a day (QID) | INTRAMUSCULAR | Status: DC | PRN
  Administered 2024-01-26 – 2024-01-31 (×7): 4 mg via INTRAVENOUS
  Filled 2024-01-25 (×7): qty 2

## 2024-01-25 MED ORDER — LORATADINE 10 MG PO TABS
10.0000 mg | ORAL_TABLET | Freq: Every day | ORAL | Status: DC
  Administered 2024-01-25 – 2024-01-31 (×6): 10 mg via ORAL
  Filled 2024-01-25 (×7): qty 1

## 2024-01-25 MED ORDER — SODIUM CHLORIDE 0.9 % IV SOLN
1.0000 g | Freq: Once | INTRAVENOUS | Status: AC
  Administered 2024-01-25: 1 g via INTRAVENOUS
  Filled 2024-01-25: qty 10

## 2024-01-25 MED ORDER — SACCHAROMYCES BOULARDII 250 MG PO CAPS
250.0000 mg | ORAL_CAPSULE | Freq: Two times a day (BID) | ORAL | Status: DC
  Administered 2024-01-25 – 2024-01-31 (×11): 250 mg via ORAL
  Filled 2024-01-25 (×13): qty 1

## 2024-01-25 MED ORDER — ACETAMINOPHEN 325 MG PO TABS
650.0000 mg | ORAL_TABLET | Freq: Four times a day (QID) | ORAL | Status: DC | PRN
  Administered 2024-01-25 – 2024-01-26 (×2): 650 mg via ORAL
  Filled 2024-01-25 (×2): qty 2

## 2024-01-25 MED ORDER — VANCOMYCIN HCL 125 MG PO CAPS
125.0000 mg | ORAL_CAPSULE | Freq: Two times a day (BID) | ORAL | Status: DC
  Administered 2024-01-25 – 2024-01-28 (×5): 125 mg via ORAL
  Filled 2024-01-25 (×6): qty 1

## 2024-01-25 MED ORDER — FLUOXETINE HCL 20 MG PO CAPS
20.0000 mg | ORAL_CAPSULE | Freq: Every day | ORAL | Status: DC
  Administered 2024-01-25 – 2024-01-31 (×6): 20 mg via ORAL
  Filled 2024-01-25 (×7): qty 1

## 2024-01-25 MED ORDER — SODIUM CHLORIDE 0.9 % IV SOLN
500.0000 mg | INTRAVENOUS | Status: DC
  Administered 2024-01-25 – 2024-01-26 (×2): 500 mg via INTRAVENOUS
  Filled 2024-01-25 (×2): qty 5

## 2024-01-25 MED ORDER — BUPROPION HCL 75 MG PO TABS
75.0000 mg | ORAL_TABLET | Freq: Every morning | ORAL | Status: DC
  Administered 2024-01-26 – 2024-01-31 (×5): 75 mg via ORAL
  Filled 2024-01-25 (×7): qty 1

## 2024-01-25 MED ORDER — LEVOTHYROXINE SODIUM 88 MCG PO TABS
88.0000 ug | ORAL_TABLET | Freq: Every day | ORAL | Status: DC
  Administered 2024-01-26 – 2024-02-01 (×6): 88 ug via ORAL
  Filled 2024-01-25 (×6): qty 1

## 2024-01-25 MED ORDER — SODIUM CHLORIDE 0.9 % IV SOLN: 1.0000 g | INTRAVENOUS | Status: DC

## 2024-01-25 NOTE — ED Provider Notes (Signed)
 [place] EMERGENCY DEPARTMENT AT [facility] Provider Note   CSN: [id number] Arrival date & time: 01/25/24  1140     History  Chief Complaint  Patient presents with   Shortness of Breath   Weakness    [name] is a 78 y.o. female.  This is a 78 year old female presenting for shortness of breath and cough that started this past Wednesday.  Reports fever at home 101.3.  Took Tylenol .  Went to PCP was concerned for pneumonia.  Was placed on oxygen by EMS, unclear oxygen saturation at that time.  Attempted to take off oxygen saturation here and quickly desatted down to 8990%.  On 2 L nasal cannula currently with improvement of oxygen saturation.  She notes no nausea no vomiting, no abdominal pain.  History of A-fib on Eliquis  has not missed any doses.  She reports that she feels much improved with oxygen therapy.   Shortness of Breath Weakness Associated symptoms: shortness of breath        Home Medications Prior to Admission medications   Medication Sig Start Date End Date Taking? Authorizing Provider  acetaminophen  (TYLENOL ) 325 MG tablet Take 2 tablets (650 mg total) by mouth every 8 (eight) hours. Patient taking differently: Take 650 mg by mouth in the morning and at bedtime. 02/18/19   Love, Renay Carota, PA-C  apixaban  (ELIQUIS ) 5 MG TABS tablet Take 1 tablet (5 mg total) by mouth 2 (two) times daily. 09/14/23   [place], Peter M, MD  ascorbic acid (VITAMIN C) 500 MG tablet Take 500 mg by mouth daily.    [provider]  atorvastatin  (LIPITOR) 10 MG tablet Take 1 tablet (10 mg total) by mouth daily. 02/18/19   Love, Renay Carota, PA-C  bisoprolol  (ZEBETA ) 5 MG tablet Take 2.5 mg by mouth daily. 03/11/23   [provider]  buPROPion  (WELLBUTRIN ) 75 MG tablet Take 75 mg by mouth every morning. 12/31/20   [provider]  Cholecalciferol (VITAMIN D) 125 MCG (5000 UT) CAPS Take 5,000 Units by mouth daily.    [provider]  ferrous sulfate   325 (65 FE) MG tablet Take 1 tablet (325 mg total) by mouth daily with breakfast. 01/09/24 04/08/24  Dahal, Aminta Baldy, MD  FLUoxetine  (PROZAC ) 10 MG capsule Take 20 mg by mouth at bedtime.    [provider]  FLUoxetine  (PROZAC ) 20 MG capsule Take 1 capsule (20 mg total) by mouth at bedtime. 02/18/19   Love, Renay Carota, PA-C  gabapentin  (NEURONTIN ) 600 MG tablet Take 600 mg by mouth in the morning and at bedtime.    [provider]  levothyroxine  (SYNTHROID ) 88 MCG tablet Take 1 tablet (88 mcg total) by mouth daily before breakfast. 03/13/19   [place], Peter M, MD  loratadine  (CLARITIN ) 10 MG tablet Take 10 mg by mouth daily.    [provider]  omeprazole  (PRILOSEC ) 20 MG capsule Take 1 capsule (20 mg total) by mouth 2 (two) times daily before a meal. 07/09/23   Zehr, Camilo Cella D, PA-C  ondansetron  (ZOFRAN -ODT) 4 MG disintegrating tablet Take 1 tablet (4 mg total) by mouth every 8 (eight) hours as needed for nausea or vomiting. 05/26/23   Sheikh, Omair Latif, DO  saccharomyces boulardii (FLORASTOR) 250 MG capsule Take 1 capsule (250 mg total) by mouth 2 (two) times daily. 12/10/23   May, Deanna J, NP      Allergies    Penicillins    Review of Systems   Review of Systems  Respiratory:  Positive for shortness of breath.   Neurological:  Positive for weakness.    Physical Exam Updated Vital Signs BP 100/70   Pulse 92   Temp 97.9 F (36.6 C) (Oral)   Resp 18   Ht 5\' 6"  (1.676 m)   Wt 58.1 kg   SpO2 98%   BMI 20.67 kg/m  Physical Exam Vitals and nursing note reviewed.  Constitutional:      General: She is not in acute distress.    Appearance: She is not toxic-appearing.  HENT:     Head: Normocephalic.  Cardiovascular:     Rate and Rhythm: Normal rate.  Pulmonary:     Effort: Pulmonary effort is normal.     Breath sounds: Examination of the right-upper field reveals rhonchi. Examination of the right-middle field reveals rhonchi. Rhonchi present.  Chest:     Chest  wall: No mass.  Musculoskeletal:     Cervical back: Normal range of motion.     Right lower leg: No edema.     Left lower leg: No edema.  Skin:    General: Skin is warm.     Capillary Refill: Capillary refill takes less than 2 seconds.  Neurological:     Mental Status: She is alert and oriented to person, place, and time.  Psychiatric:        Mood and Affect: Mood normal.        Behavior: Behavior normal.     ED Results / Procedures / Treatments   Labs (all labs ordered are listed, but only abnormal results are displayed) Labs Reviewed  CBC WITH DIFFERENTIAL/PLATELET - Abnormal; Notable for the following components:      Result Value   RBC 3.84 (*)    Hemoglobin 10.6 (*)    HCT 35.2 (*)    All other components within normal limits  BASIC METABOLIC PANEL WITH GFR - Abnormal; Notable for the following components:   Potassium 3.2 (*)    Chloride 113 (*)    CO2 18 (*)    Creatinine, Ser 1.06 (*)    Calcium  7.1 (*)    GFR, Estimated 54 (*)    All other components within normal limits  RESP PANEL BY RT-PCR (RSV, FLU A&B, COVID)  [id number]  CULTURE, BLOOD (ROUTINE X 2)  CULTURE, BLOOD (ROUTINE X 2)  I-STAT CG4 LACTIC ACID, ED    EKG EKG Interpretation Date/Time:  Friday January 25 2024 11:51:52 EDT Ventricular Rate:  95 PR Interval:    QRS Duration:  112 QT Interval:  370 QTC Calculation: 466 R Axis:   82  Text Interpretation: Atrial fibrillation Incomplete right bundle branch block No significant change since last tracing Confirmed by Trish Furl [phone]) on 01/25/2024 11:56:14 AM  Radiology DG Chest 2 View Result Date: 01/25/2024 CLINICAL DATA:  Suspected pneumonia EXAM: CHEST - 2 VIEW COMPARISON:  Jan 02, 2024 FINDINGS: Comparison with prior examination there is persistent right mid lung atelectasis with more consolidation may indicate residual infiltrates and may correlate with pneumonia. Left lung clear Heart and mediastinum normal IMPRESSION: Residual right mid lung  infiltrates and atelectasis could correlate with residual pneumonic infiltrates. Electronically Signed   By: Fredrich Jefferson M.D.   On: 01/25/2024 13:08    Procedures Procedures    Medications Ordered in ED Medications  azithromycin (ZITHROMAX) 500 mg in sodium chloride  0.9 % 250 mL IVPB (has no administration in time range)  cefTRIAXone  (ROCEPHIN ) 1 g in sodium chloride  0.9 % 100 mL IVPB (has no administration  in time range)  ipratropium-albuterol  (DUONEB) 0.5-2.5 (3) MG/3ML nebulizer solution 3 mL (has no administration in time range)  potassium chloride  SA (KLOR-CON  M) CR tablet 40 mEq (has no administration in time range)    ED Course/ Medical Decision Making/ A&P Clinical Course as of 01/25/24 1446  Fri Jan 25, 2024  1436 DG Chest 2 View MPRESSION: Residual right mid lung infiltrates and atelectasis could correlate with residual pneumonic infiltrates.   [TY]    Clinical Course User Index [TY] Rolinda Climes, DO                                 Medical Decision Making This is a 78 year old female presenting emergency department for shortness of breath.  She is afebrile here, but had fevers at home and took Tylenol .  She is nontachycardic, blood pressure slightly soft.  Maintaining oxygen saturation on 2 L nasal cannula.  Does not wear oxygen at home.  She does have a complex past medical history to include hypertension, lung cancer, stroke, CKD, persistent A-fib on Eliquis .  Recent admission for sepsis thought to be secondary to UTI.  Chest x-ray today with findings concerning for right midlung pneumonia.  Started on antibiotics.  She has no leukocytosis however.  Triage provider ordered flu/COVID/RSV, negative.  Does have some minor low potassium; oral potassium ordered.  Feels improved with oxygen, but does have some adventitious lung sounds.  DuoNeb ordered.  H&H showed anemia, but this is improved compared to prior.  Given patient's new oxygen requirements will admit for  pneumonia for antibiotics.   Amount and/or Complexity of Data Reviewed Radiology:  Decision-making details documented in ED Course.    Details: Considered PE, however patient not tachycardic, and is on Eliquis .  Chest x-ray and history more consistent with infectious process.  Will forego CT scan at this time ECG/medicine tests: independent interpretation performed.    Details: No ST segment changes that would indicate ischemia my independent interpretation. Discussion of management or test interpretation with external provider(s): Hospitalist  Risk Prescription drug management. Decision regarding hospitalization.         Final Clinical Impression(s) / ED Diagnoses Final diagnoses:  None    Rx / DC Orders ED Discharge Orders     None         Rolinda Climes, DO 01/25/24 1446
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
## 2024-01-25 NOTE — ED Provider Triage Note (Signed)
 Emergency Medicine Provider Triage Evaluation Note  DALIA JOLLIE , a 78 y.o. female  was evaluated in triage.  Pt complains of SOB and weakness started Wednesday. Febrile at home, went to PCP, concern for pneumonia. PMHx reported by patient significant for lung cancer, AFIB, emphysema, previous smoker.   Review of Systems  Positive: Fever, shortness of breath, weakness, nausea Negative: Chest pain, abdominal pain, no issues going to restroom, vomiting  Physical Exam  BP 100/70   Pulse 92   Temp 97.9 F (36.6 C) (Oral)   Resp 18   Ht 5\' 6"  (1.676 m)   Wt 58.1 kg   SpO2 98%   BMI 20.67 kg/m  Gen:   Awake, no distress   Resp:  Normal effort on 2L of o2 [place] at rest, rhonchi heard on exam, mild wheezing, reduced lung sounds MSK:   Moves extremities without difficulty  Other:  No weakness on exam  Medical Decision Making  Medically screening exam initiated at 12:39 PM.  Appropriate orders placed.  ZYKERIA LAGUARDIA was informed that the remainder of the evaluation will be completed by another provider, this initial triage assessment does not replace that evaluation, and the importance of remaining in the ED until their evaluation is complete.  Orders placed: CBC, BMP, CXR, EKG, covid/RSV/flu   Fonda Hymen, PA-C 01/25/24 1248

## 2024-01-25 NOTE — Plan of Care (Signed)
   Problem: Education: Goal: Knowledge of General Education information will improve Description Including pain rating scale, medication(s)/side effects and non-pharmacologic comfort measures Outcome: Progressing

## 2024-01-26 DIAGNOSIS — J9601 Acute respiratory failure with hypoxia: Secondary | ICD-10-CM | POA: Diagnosis not present

## 2024-01-26 LAB — COMPREHENSIVE METABOLIC PANEL WITH GFR
ALT: 9 U/L (ref 0–44)
AST: 15 U/L (ref 15–41)
Albumin: 3.4 g/dL — ABNORMAL LOW (ref 3.5–5.0)
Alkaline Phosphatase: 66 U/L (ref 38–126)
Anion gap: 7 (ref 5–15)
BUN: 13 mg/dL (ref 8–23)
CO2: 24 mmol/L (ref 22–32)
Calcium: 9 mg/dL (ref 8.9–10.3)
Chloride: 104 mmol/L (ref 98–111)
Creatinine, Ser: 1.08 mg/dL — ABNORMAL HIGH (ref 0.44–1.00)
GFR, Estimated: 53 mL/min — ABNORMAL LOW (ref >60)
Glucose, Bld: 90 mg/dL (ref 70–99)
Potassium: 4.5 mmol/L (ref 3.5–5.1)
Sodium: 135 mmol/L (ref 135–145)
Total Bilirubin: 0.9 mg/dL (ref 0.0–1.2)
Total Protein: 6.6 g/dL (ref 6.5–8.1)

## 2024-01-26 LAB — CBC
HCT: 33.4 % — ABNORMAL LOW (ref 36.0–46.0)
Hemoglobin: 9.7 g/dL — ABNORMAL LOW (ref 12.0–15.0)
MCH: 27.7 pg (ref 26.0–34.0)
MCHC: 29 g/dL — ABNORMAL LOW (ref 30.0–36.0)
MCV: 95.4 fL (ref 80.0–100.0)
Platelets: 164 10*3/uL (ref 150–400)
RBC: 3.5 MIL/uL — ABNORMAL LOW (ref 3.87–5.11)
RDW: 14.7 % (ref 11.5–15.5)
WBC: 3.1 10*3/uL — ABNORMAL LOW (ref 4.0–10.5)
nRBC: 0 % (ref 0.0–0.2)

## 2024-01-26 MED ORDER — GUAIFENESIN-DM 100-10 MG/5ML PO SYRP
5.0000 mL | ORAL_SOLUTION | ORAL | Status: DC | PRN
  Administered 2024-01-26 (×2): 5 mL via ORAL
  Filled 2024-01-26 (×2): qty 5

## 2024-01-26 MED ORDER — ACETAMINOPHEN 325 MG PO TABS
650.0000 mg | ORAL_TABLET | ORAL | Status: DC | PRN
  Administered 2024-01-27 – 2024-01-31 (×2): 650 mg via ORAL
  Filled 2024-01-26 (×2): qty 2

## 2024-01-26 MED ORDER — IBUPROFEN 200 MG PO TABS
400.0000 mg | ORAL_TABLET | Freq: Four times a day (QID) | ORAL | Status: DC
  Administered 2024-01-26 – 2024-01-31 (×18): 400 mg via ORAL
  Filled 2024-01-26 (×20): qty 2

## 2024-01-26 MED ORDER — GUAIFENESIN-DM 100-10 MG/5ML PO SYRP
5.0000 mL | ORAL_SOLUTION | ORAL | Status: DC
  Administered 2024-01-26 – 2024-02-01 (×26): 5 mL via ORAL
  Filled 2024-01-26 (×26): qty 5

## 2024-01-26 NOTE — Progress Notes (Signed)
 [facility] Progress Note  Patient: [name]     WUX:[id number]  DOA: 01/25/2024   PCP: Avva, Ravisankar, MD       Brief hospital course: 78 year old female with history of stage Ia lung adenocarcinoma status post XRT, chronic abdominal pain and nausea, history of recurrent C. difficile colitis, permanent atrial fibrillation, CKD 3, hypothyroidism who presents to the hospital for shortness of breath, cough, fever of 101.   Last hospital admission was from 5/14 through 5/24 left-sided pyelonephritis, sepsis & AKI.  Home health physical therapy was prescribed on discharge. She states her the physical therapist who came to the home to work 1 time was sick with a respiratory infection but she was wearing a mask and standing as far as possible.  In the ED: Pulse ox 86% on room air Chest x-ray showing right middle lobe infiltrate   Started on azithromycin  and ceftriaxone   Subjective:  Has a persisting cough which is somewhat controlled with cough medicine.  Assessment and Plan: Principal Problem:   Acute hypoxemic respiratory failure  - Right middle lobe infiltrate seen on chest x-ray but in the past she has had radiation changes in this area. - Procalcitonin is less than 0.10-discontinue antibiotics -respiratory panel, COVID, influenza and respiratory syncytial virus negative but she still likely has a viral respiratory infection causing her cough and fever - Will schedule guaifenesin /dextromethorphan for her as her cough is quite significant - Have asked RN to obtain a room air pulse ox  Active Problems: - Abdominal pain-history of C. difficile colitis - On oral vancomycin  started on admission to prevent recurrence of C. difficile while she was on antibiotics-will continue this for today - Symptomatic pain management    Persistent atrial fibrillation (HCC) -Continue Eliquis  and Zebeta   CKD stage IIIb - Creatinine stable  Hypokalemia - Potassium was 3.2 and has  improved to 4.5  Normocytic anemia - Hemoglobin ranges from 9-11  Hypothyroidism - Continue Synthroid     Code Status: Full Code Total time on patient care: 35 DVT prophylaxis:  Eliquis    Objective:   Vitals:   01/25/24 2244 01/26/24 0121 01/26/24 0532 01/26/24 0911  BP:  124/79 117/67 126/78  Pulse:  76 66 84  Resp:  18 18 18   Temp: 99.3 F (37.4 C) 97.8 F (36.6 C) 100.3 F (37.9 C) (!) 100.8 F (38.2 C)  TempSrc: Oral Oral Oral Oral  SpO2:  98% 99% 96%  Weight:      Height:       Filed Weights   01/25/24 1150  Weight: 58.1 kg   Exam: General exam: Appears comfortable  HEENT: oral mucosa moist Respiratory system: Poor air movement when taking deep breaths and congested cough, no tachypnea Cardiovascular system: S1 & S2 heard  Gastrointestinal system: Abdomen soft, non-tender, nondistended. Normal bowel sounds   Extremities: No cyanosis, clubbing or edema Psychiatry:  Mood & affect appropriate.      CBC: Recent Labs  Lab 01/25/24 1318 01/26/24 0741  WBC 4.2 3.1*  NEUTROABS 3.0  --   HGB 10.6* 9.7*  HCT 35.2* 33.4*  MCV 91.7 95.4  PLT 210 164   Basic Metabolic Panel: Recent Labs  Lab 01/25/24 1318 01/26/24 0741  NA 141 135  K 3.2* 4.5  CL 113* 104  CO2 18* 24  GLUCOSE 73 90  BUN 13 13  CREATININE 1.06* 1.08*  CALCIUM  7.1* 9.0     Scheduled Meds:  apixaban   5 mg Oral BID   atorvastatin   10  mg Oral Daily   bisoprolol   2.5 mg Oral Daily   buPROPion   75 mg Oral q morning   FLUoxetine   20 mg Oral QHS   gabapentin   600 mg Oral QHS   guaiFENesin   600 mg Oral BID   levothyroxine   88 mcg Oral QAC breakfast   loratadine   10 mg Oral Daily   saccharomyces boulardii  250 mg Oral BID   vancomycin   125 mg Oral BID    Imaging and lab data personally reviewed   Author: Celsey Asselin  01/26/2024 10:51 AM  To contact [facility]>   Check the care team in [facility] and look for the attending/consulting [facility] provider listed  Log into [email or web address]  and use [place]'s universal password   Go to> "[facility]"  and find provider  If you still have difficulty reaching the provider, please page the [facility] (Director on Call) for the Hospitalists listed on amion

## 2024-01-26 NOTE — Plan of Care (Signed)
  Problem: Clinical Measurements: Goal: Ability to maintain clinical measurements within normal limits will improve Outcome: Progressing Goal: Respiratory complications will improve Outcome: Progressing   Problem: Coping: Goal: Level of anxiety will decrease Outcome: Progressing   Problem: Safety: Goal: Ability to remain free from injury will improve Outcome: Progressing   

## 2024-01-27 DIAGNOSIS — J9601 Acute respiratory failure with hypoxia: Secondary | ICD-10-CM | POA: Diagnosis not present

## 2024-01-27 LAB — CBC
HCT: 35.4 % — ABNORMAL LOW (ref 36.0–46.0)
Hemoglobin: 10.5 g/dL — ABNORMAL LOW (ref 12.0–15.0)
MCH: 27.9 pg (ref 26.0–34.0)
MCHC: 29.7 g/dL — ABNORMAL LOW (ref 30.0–36.0)
MCV: 93.9 fL (ref 80.0–100.0)
Platelets: 163 10*3/uL (ref 150–400)
RBC: 3.77 MIL/uL — ABNORMAL LOW (ref 3.87–5.11)
RDW: 14.8 % (ref 11.5–15.5)
WBC: 3 10*3/uL — ABNORMAL LOW (ref 4.0–10.5)
nRBC: 0 % (ref 0.0–0.2)

## 2024-01-27 LAB — C DIFFICILE QUICK SCREEN W PCR REFLEX
C Diff antigen: NEGATIVE
C Diff interpretation: NOT DETECTED
C Diff toxin: NEGATIVE

## 2024-01-27 LAB — BASIC METABOLIC PANEL WITH GFR
Anion gap: 8 (ref 5–15)
BUN: 12 mg/dL (ref 8–23)
CO2: 22 mmol/L (ref 22–32)
Calcium: 9.4 mg/dL (ref 8.9–10.3)
Chloride: 107 mmol/L (ref 98–111)
Creatinine, Ser: 1.05 mg/dL — ABNORMAL HIGH (ref 0.44–1.00)
GFR, Estimated: 54 mL/min — ABNORMAL LOW (ref >60)
Glucose, Bld: 96 mg/dL (ref 70–99)
Potassium: 4 mmol/L (ref 3.5–5.1)
Sodium: 137 mmol/L (ref 135–145)

## 2024-01-27 NOTE — TOC Initial Note (Signed)
 Transition of Care [facility]) - Initial/Assessment Note    Patient Details  Name: [name] MRN: [id number] Date of Birth: 1945-09-16  Transition of Care [facility] CM/SW Contact:    Amaryllis Junior, LCSW Phone Number: 01/27/2024, 10:42 AM  Clinical Narrative:                 Pt from home with spouse. Pt continues medical workup. TOC following for dc needs.     Barriers to Discharge: Continued Medical Work up   Patient Goals and CMS Choice Patient states their goals for this hospitalization and ongoing recovery are:: return home   Choice offered to / list presented to : NA      Expected Discharge Plan and Services In-house Referral: NA Discharge Planning Services: NA   Living arrangements for the past 2 months: Apartment                 DME Arranged: N/A DME Agency: NA       HH Arranged: NA HH Agency: NA        Prior Living Arrangements/Services Living arrangements for the past 2 months: Apartment Lives with:: Spouse Patient language and need for interpreter reviewed:: Yes Do you feel safe going back to the place where you live?: Yes      Need for Family Participation in Patient Care: Yes (Comment) Care giver support system in place?: Yes (comment)   Criminal Activity/Legal Involvement Pertinent to Current Situation/Hospitalization: No - Comment as needed  Activities of Daily Living   ADL Screening (condition at time of admission) Independently performs ADLs?: No Does the patient have a NEW difficulty with bathing/dressing/toileting/self-feeding that is expected to last >3 days?: No Does the patient have a NEW difficulty with getting in/out of bed, walking, or climbing stairs that is expected to last >3 days?: No Does the patient have a NEW difficulty with communication that is expected to last >3 days?: No Is the patient deaf or have difficulty hearing?: No Does the patient have difficulty seeing, even when wearing glasses/contacts?: No Does the patient have  difficulty concentrating, remembering, or making decisions?: No  Permission Sought/Granted                  Emotional Assessment Appearance:: Appears stated age Attitude/Demeanor/Rapport: Engaged Affect (typically observed): Accepting Orientation: : Oriented to Self, Oriented to Place, Oriented to  Time, Oriented to Situation Alcohol  / Substance Use: Not Applicable Psych Involvement: No (comment)  Admission diagnosis:  Acute hypoxemic respiratory failure (HCC) [J96.01] Pneumonia of right middle lobe due to infectious organism [J18.9] Patient Active Problem List   Diagnosis Date Noted   Acute hypoxemic respiratory failure (HCC) 01/25/2024   Pyelonephritis 01/02/2024   Acute renal failure superimposed on stage 3a chronic kidney disease (HCC) 01/02/2024   COPD (chronic obstructive pulmonary disease) (HCC)    Gastritis and gastroduodenitis 07/12/2023   Dilated pancreatic duct 07/12/2023   Dilation of biliary tract 07/03/2023   Acute cystitis 05/22/2023   Abdominal pain 05/22/2023   History of CVA (cerebrovascular accident) 05/22/2023   GAD (generalized anxiety disorder) 05/22/2023   Lung cancer (HCC) 03/01/2023   Lung nodule 02/15/2023   Clostridioides difficile infection 12/11/2022   Malnutrition of moderate degree (HCC) 12/08/2022   Intractable nausea and vomiting 12/07/2022   Hyponatremia 12/07/2022   Acute urinary retention 06/18/2022   Acute metabolic encephalopathy 06/17/2022   Pyuria 06/17/2022   GERD (gastroesophageal reflux disease) 06/17/2022   Hyperglycemia 06/17/2022   Low back pain 05/28/2020  Dilation of pancreatic duct 05/11/2020   Anterior dislocation of right shoulder 06/17/2019   Shoulder pain, right 06/17/2019   Duodenitis 02/24/2019   Chronic pain syndrome 02/24/2019   Debility 02/13/2019   Nausea and vomiting    Aspiration pneumonia (HCC)    Diarrhea    Pressure injury of skin 02/08/2019   Pneumonia of right lower lobe due to methicillin  susceptible Staphylococcus aureus (MSSA) (HCC)    Volume overload state of heart    Acute encephalopathy 02/01/2019   Acute respiratory failure with hypoxia (HCC) 02/01/2019   Hypokalemia 02/01/2019   Hypomagnesemia 02/01/2019   Leukocytosis 02/01/2019   Osteoarthritis 02/01/2019   Hypothyroidism 02/01/2019   Anticoagulant long-term use    Persistent atrial fibrillation (HCC) 06/27/2017   Hip fracture (HCC) 06/27/2017   Irregular heart rate    Closed right hip fracture, initial encounter (HCC) 06/26/2017   Protein-calorie malnutrition, severe 06/30/2016   S/P laparoscopic cholecystectomy 06/28/2016   Visual field loss following stroke    Hyperlipidemia    Essential hypertension    PCP:  Lonzie Robins, MD Pharmacy:   [facility] [id number] Jonette Nestle, [place] - [address] [place] [address] Phone: [phone] Fax: [phone]     Social Drivers of Health (SDOH) Social History: SDOH Screenings   Food Insecurity: No Food Insecurity (01/25/2024)  Housing: Low Risk  (01/25/2024)  Transportation Needs: No Transportation Needs (01/25/2024)  Utilities: Not At Risk (01/25/2024)  Depression (PHQ2-9): Low Risk  (02/28/2023)  Social Connections: Socially Integrated (01/25/2024)  Tobacco Use: Medium Risk (01/25/2024)   SDOH Interventions:     Readmission Risk Interventions    01/27/2024   10:41 AM 01/03/2024   12:11 PM 05/23/2023   10:20 AM  Readmission Risk Prevention Plan  Transportation Screening Complete Complete Complete  PCP or Specialist Appt within 5-7 Days Complete  Complete  PCP or Specialist Appt within 3-5 Days  Complete   Home Care Screening Complete  Complete  Medication Review (RN CM) Complete  Complete  HRI or Home Care Consult  Complete   Social Work Consult for Recovery Care Planning/Counseling  Complete   Palliative Care Screening  Complete   Medication Review Ocean[employer])  Complete

## 2024-01-27 NOTE — Plan of Care (Signed)

## 2024-01-27 NOTE — Progress Notes (Addendum)
 [facility] Progress Note  Patient: [name]     ZOX:[id number]  DOA: 01/25/2024   PCP: Avva, Ravisankar, MD       Brief hospital course: 78 year old female with history of stage Ia lung adenocarcinoma status post XRT, chronic abdominal pain and nausea, history of recurrent C. difficile colitis, permanent atrial fibrillation, CKD 3, hypothyroidism who presents to the hospital for shortness of breath, cough, fever of 101.   Last hospital admission was from 5/14 through 5/24 left-sided pyelonephritis, sepsis & AKI.  Home health physical therapy was prescribed on discharge. She states her the physical therapist who came to the home to work 1 time was sick with a respiratory infection but she was wearing a mask and standing as far as possible.  In the ED: Pulse ox 86% on room air Chest x-ray showing right middle lobe infiltrate   Started on azithromycin  and ceftriaxone   Subjective:  Seen this AM. Very nauseated from breakfast. One liquid BM this AM.   Assessment and Plan: Principal Problem:   Acute hypoxemic respiratory failure  - Right middle lobe infiltrate seen on chest x-ray but in the past she has had radiation changes in this area. - Procalcitonin is less than 0.10-discontinue antibiotics -respiratory panel, COVID, influenza and respiratory syncytial virus negative but she still likely has a viral respiratory infection causing her cough and fever -   guaifenesin /dextromethorphan for her as her cough is quite significant - weaned to RA on 6/7  Active Problems: Abdominal pain-history of C. difficile colitis in 2024 Severe nausea after BF today - On oral vancomycin  started on admission to prevent recurrence of C. difficile while she was on antibiotics-Rocephin  x 1 dose and Azithromycin  x 2 doses given after she was admitted- I stopped them on 6/7 because she procalcitonin was negative - high chance for recurrent c diff- cont oral Vanc- check stool for C diff -  Symptomatic pain and nausea management    Persistent atrial fibrillation (HCC) -Continue Eliquis  and Zebeta   CKD stage IIIb - Creatinine stable  Hypokalemia - Potassium was 3.2 and has improved to 4.5  Normocytic anemia - Hemoglobin ranges from 9-11  Hypothyroidism - Continue Synthroid     Code Status: Full Code Total time on patient care: 35 DVT prophylaxis:  Eliquis    Objective:   Vitals:   01/26/24 1427 01/26/24 2043 01/27/24 0605 01/27/24 1253  BP:  (!) 107/92 128/85 (!) 135/90  Pulse:  (!) 41 87 99  Resp:  16 16 17   Temp:  98.9 F (37.2 C) 98.3 F (36.8 C) (!) 97.4 F (36.3 C)  TempSrc:  Oral Oral Oral  SpO2: 95% 93% 91% 94%  Weight:      Height:       Filed Weights   01/25/24 1150  Weight: 58.1 kg   Exam: General exam: Appears very uncomfortable from being nauseated Respiratory system: CTA, no tachypnea Cardiovascular system: S1 & S2 heard  Gastrointestinal system: Abdomen soft, mildly tender in RLQ , nondistended. Normal bowel sounds   Extremities: No cyanosis, clubbing or edema Psychiatry:  Mood & affect appropriate.      CBC: Recent Labs  Lab 01/25/24 1318 01/26/24 0741 01/27/24 0659  WBC 4.2 3.1* 3.0*  NEUTROABS 3.0  --   --   HGB 10.6* 9.7* 10.5*  HCT 35.2* 33.4* 35.4*  MCV 91.7 95.4 93.9  PLT 210 164 163   Basic Metabolic Panel: Recent Labs  Lab 01/25/24 1318 01/26/24 0741 01/27/24 0659  NA 141 135 137  K 3.2* 4.5 4.0  CL 113* 104 107  CO2 18* 24 22  GLUCOSE 73 90 96  BUN 13 13 12   CREATININE 1.06* 1.08* 1.05*  CALCIUM  7.1* 9.0 9.4     Scheduled Meds:  apixaban   5 mg Oral BID   atorvastatin   10 mg Oral Daily   bisoprolol   2.5 mg Oral Daily   buPROPion   75 mg Oral q morning   FLUoxetine   20 mg Oral QHS   gabapentin   600 mg Oral QHS   guaiFENesin   600 mg Oral BID   guaiFENesin -dextromethorphan  5 mL Oral Q4H while awake   ibuprofen  400 mg Oral QID   levothyroxine   88 mcg Oral QAC breakfast   loratadine   10 mg Oral  Daily   saccharomyces boulardii  250 mg Oral BID   vancomycin   125 mg Oral BID    Imaging and lab data personally reviewed   Author: Iam Lipson  01/27/2024 3:20 PM  To contact [facility]>   Check the care team in [facility] and look for the attending/consulting TR[facility] provider listed  Log into [email or web address] and use [place]'s universal password   Go to> "[facility]"  and find provider  If you still have difficulty reaching the provider, please page the [facility] (Director on Call) for the Hospitalists listed on amion

## 2024-01-28 DIAGNOSIS — J9601 Acute respiratory failure with hypoxia: Secondary | ICD-10-CM | POA: Diagnosis not present

## 2024-01-28 MED ORDER — ALBUTEROL SULFATE (2.5 MG/3ML) 0.083% IN NEBU
2.5000 mg | INHALATION_SOLUTION | RESPIRATORY_TRACT | Status: DC | PRN
  Administered 2024-01-28 – 2024-02-01 (×3): 2.5 mg via RESPIRATORY_TRACT
  Filled 2024-01-28 (×4): qty 3

## 2024-01-28 MED ORDER — HYDROCOD POLI-CHLORPHE POLI ER 10-8 MG/5ML PO SUER
5.0000 mL | Freq: Two times a day (BID) | ORAL | Status: DC
  Administered 2024-01-28 – 2024-02-01 (×9): 5 mL via ORAL
  Filled 2024-01-28 (×9): qty 5

## 2024-01-28 NOTE — Plan of Care (Signed)
  Problem: Pain Managment: Goal: General experience of comfort will improve and/or be controlled Outcome: Progressing   Problem: Coping: Goal: Level of anxiety will decrease Outcome: Progressing

## 2024-01-28 NOTE — Progress Notes (Signed)
 [facility] Progress Note  Patient: [name]     BJY:[id number]  DOA: 01/25/2024   PCP: Avva, Ravisankar, MD       Brief hospital course: 78 year old female with history of stage Ia lung adenocarcinoma status post XRT, chronic abdominal pain and nausea, history of recurrent C. difficile colitis, permanent atrial fibrillation, CKD 3, hypothyroidism who presents to the hospital for shortness of breath, cough, fever of 101.   Last hospital admission was from 5/14 through 5/24 left-sided pyelonephritis, sepsis & AKI.  Home health physical therapy was prescribed on discharge. She states her the physical therapist who came to the home to work 1 time was sick with a respiratory infection but she was wearing a mask and standing as far as possible.  In the ED: Pulse ox 86% on room air Chest x-ray showing right middle lobe infiltrate   Started on azithromycin  and ceftriaxone   Subjective:   Severe cough is persistent. Nausea has improved but still has pain in abdomen.   Assessment and Plan: Principal Problem:   Acute hypoxemic respiratory failure  - Right middle lobe infiltrate seen on chest x-ray but in the past she has had radiation changes in this area therefore I am not able to tell if she has a pneumonia - Procalcitonin is less than 0.10 -discontinued antibiotics on 6/8 -respiratory panel, COVID, influenza and respiratory syncytial virus negative but she still likely has a viral respiratory infection causing her cough and fever - weaned to RA on 6/7 - add Tussionex for cough and Albuterol  nebs today-   Active Problems: Abdominal pain-history of C. difficile colitis in 2024 - Severe nausea on 6/8 - On oral vancomycin  started on admission to prevent recurrence of C. difficile while she was on antibiotics-Rocephin  x 1 dose and Azithromycin  x 2 doses given after she was admitted- I stopped them on 6/7 because she procalcitonin was negative - stool for C diff negative -  Symptomatic pain and nausea management    Persistent atrial fibrillation (HCC) -Continue Eliquis  and Zebeta   CKD stage IIIb - Creatinine stable  Hypokalemia - Potassium was 3.2 and has improved to 4.5  Normocytic anemia - Hemoglobin ranges from 9-11  Hypothyroidism - Continue Synthroid     Code Status: Full Code Total time on patient care: 35 DVT prophylaxis:  Eliquis    Objective:   Vitals:   01/27/24 2036 01/28/24 0541 01/28/24 0945 01/28/24 1345  BP: 125/82 (!) 145/89  93/73  Pulse: 89 78  78  Resp:  16  20  Temp: 98.3 F (36.8 C) 97.9 F (36.6 C)  98.1 F (36.7 C)  TempSrc: Oral Oral  Oral  SpO2: 93% 93% 92% 91%  Weight:      Height:       Filed Weights   01/25/24 1150  Weight: 58.1 kg   Exam: General exam: Appears very uncomfortable from being nauseated Respiratory system: CTA, no tachypnea Cardiovascular system: S1 & S2 heard  Gastrointestinal system: Abdomen soft, mildly tender in RLQ , nondistended. Normal bowel sounds   Extremities: No cyanosis, clubbing or edema Psychiatry:  Mood & affect appropriate.      CBC: Recent Labs  Lab 01/25/24 1318 01/26/24 0741 01/27/24 0659  WBC 4.2 3.1* 3.0*  NEUTROABS 3.0  --   --   HGB 10.6* 9.7* 10.5*  HCT 35.2* 33.4* 35.4*  MCV 91.7 95.4 93.9  PLT 210 164 163   Basic Metabolic Panel: Recent Labs  Lab 01/25/24 1318 01/26/24 0741 01/27/24 0659  NA  141 135 137  K 3.2* 4.5 4.0  CL 113* 104 107  CO2 18* 24 22  GLUCOSE 73 90 96  BUN 13 13 12   CREATININE 1.06* 1.08* 1.05*  CALCIUM  7.1* 9.0 9.4     Scheduled Meds:  apixaban   5 mg Oral BID   atorvastatin   10 mg Oral Daily   bisoprolol   2.5 mg Oral Daily   buPROPion   75 mg Oral q morning   chlorpheniramine-HYDROcodone   5 mL Oral Q12H   FLUoxetine   20 mg Oral QHS   gabapentin   600 mg Oral QHS   guaiFENesin   600 mg Oral BID   guaiFENesin -dextromethorphan  5 mL Oral Q4H while awake   ibuprofen  400 mg Oral QID   levothyroxine   88 mcg Oral QAC  breakfast   loratadine   10 mg Oral Daily   saccharomyces boulardii  250 mg Oral BID   vancomycin   125 mg Oral BID    Imaging and lab data personally reviewed   Author: Jonquil Stubbe  01/28/2024 4:10 PM  To contact [facility]>   Check the care team in [facility] and look for the attending/consulting [facility] provider listed  Log into [email or web address] and use [place]'s universal password   Go to> "[facility]"  and find provider  If you still have difficulty reaching the provider, please page the [facility] (Director on Call) for the Hospitalists listed on amion

## 2024-01-28 NOTE — Plan of Care (Signed)
  Problem: Education: Goal: Knowledge of General Education information will improve Description: Including pain rating scale, medication(s)/side effects and non-pharmacologic comfort measures Outcome: Progressing   Problem: Health Behavior/Discharge Planning: Goal: Ability to manage health-related needs will improve Outcome: Progressing   Problem: Clinical Measurements: Goal: Ability to maintain clinical measurements within normal limits will improve Outcome: Progressing Goal: Will remain free from infection Outcome: Progressing Goal: Diagnostic test results will improve Outcome: Progressing Goal: Cardiovascular complication will be avoided Outcome: Progressing   Problem: Activity: Goal: Risk for activity intolerance will decrease Outcome: Progressing   Problem: Nutrition: Goal: Adequate nutrition will be maintained Outcome: Progressing   Problem: Coping: Goal: Level of anxiety will decrease Outcome: Progressing   Problem: Elimination: Goal: Will not experience complications related to bowel motility Outcome: Progressing Goal: Will not experience complications related to urinary retention Outcome: Progressing   Problem: Pain Managment: Goal: General experience of comfort will improve and/or be controlled Outcome: Progressing   Problem: Safety: Goal: Ability to remain free from injury will improve Outcome: Progressing   Problem: Skin Integrity: Goal: Risk for impaired skin integrity will decrease Outcome: Progressing   Problem: Clinical Measurements: Goal: Respiratory complications will improve Outcome: Not Progressing

## 2024-01-29 DIAGNOSIS — J9601 Acute respiratory failure with hypoxia: Secondary | ICD-10-CM | POA: Diagnosis not present

## 2024-01-29 NOTE — Plan of Care (Signed)

## 2024-01-29 NOTE — Progress Notes (Addendum)
 [facility] Progress Note  Patient: [name]     [id number]:[id number]  DOA: 01/25/2024   PCP: Avva, Ravisankar, MD       Brief hospital course: 78 year old female with history of stage Ia lung adenocarcinoma status post XRT, chronic abdominal pain and nausea, history of recurrent C. difficile colitis, permanent atrial fibrillation, CKD 3, hypothyroidism who presents to the hospital for shortness of breath, cough, fever of 101.   Last hospital admission was from 5/14 through 5/24 left-sided pyelonephritis, sepsis & AKI.  Home health physical therapy was prescribed on discharge. She states her the physical therapist who came to the home to work 1 time was sick with a respiratory infection but she was wearing a mask and standing as far as possible.  In the ED: Pulse ox 86% on room air Chest x-ray showing right middle lobe infiltrate   Started on azithromycin  and ceftriaxone   Subjective:  Had nausea on my exam.  Persistent cough.  Feels very weak.  Assessment and Plan: Principal Problem:   Acute hypoxemic respiratory failure  - Right middle lobe infiltrate seen on chest x-ray but in the past she has had radiation changes in this area therefore I am not able to tell if she has a pneumonia or which she has is mainly a viral bronchitis - Procalcitonin is less than 0.10 -discontinued antibiotics on 6/8 -respiratory panel, COVID, influenza and respiratory syncytial virus negative but she still likely has a viral respiratory infection causing her cough and fever - weaned to RA on 6/7 - added Tussionex for cough and Albuterol  nebs-still requiring albuterol  nebs for shortness of breath-continue to follow in the hospital  Active Problems: Abdominal pain-history of C. difficile colitis in 2024 - Severe nausea on 6/8 - On oral vancomycin  started on admission to prevent recurrence of C. difficile while she was on antibiotics-Rocephin  x 1 dose and Azithromycin  x 2 doses given after she was  admitted- I stopped them on 6/7 because she procalcitonin was negative - stool for C diff negative - Symptomatic pain and nausea management    Persistent atrial fibrillation (HCC) -Continue Eliquis  and Zebeta   CKD stage IIIb - Creatinine stable  Hypokalemia - Potassium was 3.2 and has improved to 4.5  Normocytic anemia - Hemoglobin ranges from 9-11  Hypothyroidism - Continue Synthroid     Code Status: Full Code Total time on patient care: 35 DVT prophylaxis:  Eliquis    Objective:   Vitals:   01/29/24 0337 01/29/24 0852 01/29/24 1335 01/29/24 1634  BP: 122/77 118/87 118/70   Pulse: 61 70 (!) 106 76  Resp: 18  16   Temp: 98.2 F (36.8 C)  98.6 F (37 C)   TempSrc: Oral  Oral   SpO2: 97% 94% 96% 94%  Weight:      Height:       Filed Weights   01/25/24 1150  Weight: 58.1 kg   Exam: General exam: Appears comfortable  HEENT: oral mucosa moist Respiratory system: Clear to auscultation.  Cardiovascular system: S1 & S2 heard  Gastrointestinal system: Abdomen soft, non-tender, nondistended. Normal bowel sounds   Extremities: No cyanosis, clubbing or edema Psychiatry:  Mood & affect appropriate.      CBC: Recent Labs  Lab 01/25/24 1318 01/26/24 0741 01/27/24 0659  WBC 4.2 3.1* 3.0*  NEUTROABS 3.0  --   --   HGB 10.6* 9.7* 10.5*  HCT 35.2* 33.4* 35.4*  MCV 91.7 95.4 93.9  PLT 210 164 163   Basic Metabolic Panel: Recent  Labs  Lab 01/25/24 1318 01/26/24 0741 01/27/24 0659  NA 141 135 137  K 3.2* 4.5 4.0  CL 113* 104 107  CO2 18* 24 22  GLUCOSE 73 90 96  BUN 13 13 12   CREATININE 1.06* 1.08* 1.05*  CALCIUM  7.1* 9.0 9.4     Scheduled Meds:  apixaban   5 mg Oral BID   atorvastatin   10 mg Oral Daily   bisoprolol   2.5 mg Oral Daily   buPROPion   75 mg Oral q morning   chlorpheniramine-HYDROcodone   5 mL Oral Q12H   FLUoxetine   20 mg Oral QHS   gabapentin   600 mg Oral QHS   guaiFENesin   600 mg Oral BID   guaiFENesin -dextromethorphan  5 mL Oral Q4H  while awake   ibuprofen  400 mg Oral QID   levothyroxine   88 mcg Oral QAC breakfast   loratadine   10 mg Oral Daily   saccharomyces boulardii  250 mg Oral BID    Imaging and lab data personally reviewed   Author: Inri Sobieski  01/29/2024 6:15 PM  To contact [facility]>   Check the care team in [facility] and look for the attending/consulting [facility] provider listed  Log into [email or web address] and use [place]'s universal password   Go to> "[facility]"  and find provider  If you still have difficulty reaching the provider, please page the [facility] (Director on Call) for the Hospitalists listed on amion

## 2024-01-29 NOTE — Progress Notes (Signed)
SATURATION QUALIFICATIONS: (This note is used to comply with regulatory documentation for home oxygen)  Patient Saturations on Room Air at Rest = 95%  Patient Saturations on Room Air while Ambulating = 92%  Patient Saturations on 0 Liters of oxygen while Ambulating = 92%  Please briefly explain why patient needs home oxygen: 

## 2024-01-30 ENCOUNTER — Inpatient Hospital Stay (HOSPITAL_COMMUNITY)

## 2024-01-30 DIAGNOSIS — J9601 Acute respiratory failure with hypoxia: Secondary | ICD-10-CM | POA: Diagnosis not present

## 2024-01-30 LAB — CULTURE, BLOOD (ROUTINE X 2)
Culture: NO GROWTH
Culture: NO GROWTH

## 2024-01-30 MED ORDER — PANTOPRAZOLE SODIUM 40 MG PO TBEC
40.0000 mg | DELAYED_RELEASE_TABLET | Freq: Every day | ORAL | Status: DC
  Administered 2024-01-30 – 2024-01-31 (×2): 40 mg via ORAL
  Filled 2024-01-30 (×2): qty 1

## 2024-01-30 NOTE — Evaluation (Signed)
 Physical Therapy Evaluation Patient Details Name: [name] MRN: [id number] DOB: Dec 30, 1945 Today's Date: 01/30/2024  History of Present Illness  78 y.o. female presents to therapy following hospital admission on 01/25/2024 due to SOB, cough elevated tempeture in home setting. Pt reports chronic abdominal pain with N and V. Pt found to have PNA. Pt PMH includes but is not limited to: CKD 3A, history of CVA, atrial fibrillation on Eliquis , depression, anxiety, hypothyroidism, recent treatment for C. difficile colitis, and recurrent UTIs.  Clinical Impression    Pt admitted with above diagnosis.  Pt currently with functional limitations due to the deficits listed below (see PT Problem List). Pt in bed when PT arrived. Pt agreeable to therapy intervention and awaiting lunch. Pt performed bed mobility with S and min cues with use of hospital bed, CGA for sit to stand  from EOB to RW, gait tasks in hallway 40 feet with RW and CGA, recliner close, noted B knee deformity R > L and excessive trunk flexion. Pt O2 saturation at rest prior to mobility 93% on RA and s/p gait tasks in hallway 93%. Pt reports some abdominal pain and chronic B LE pain, no reports of dizziness and mild SOB with exertion. Pt encouraged to sit up in recliner and all needs in place. Pt will benefit from acute skilled PT to increase their independence and safety with mobility to allow discharge.          If plan is discharge home, recommend the following: A little help with walking and/or transfers;A little help with bathing/dressing/bathroom;Assistance with cooking/housework;Assist for transportation;Help with stairs or ramp for entrance   Can travel by private vehicle   Yes    Equipment Recommendations None recommended by PT  Recommendations for Other Services       Functional Status Assessment Patient has had a recent decline in their functional status and demonstrates the ability to make significant improvements in  function in a reasonable and predictable amount of time.     Precautions / Restrictions Precautions Precautions: Fall (monitor O2) Restrictions Weight Bearing Restrictions Per Provider Order: No      Mobility  Bed Mobility Overal bed mobility: Needs Assistance Bed Mobility: Supine to Sit     Supine to sit: Supervision, Used rails Sit to supine: Used rails, Supervision, HOB elevated   General bed mobility comments: min cues    Transfers Overall transfer level: Needs assistance Equipment used: Rolling walker (2 wheels) Transfers: Sit to/from Stand Sit to Stand: Contact guard assist           General transfer comment: min cues and pull to stand from EOB    Ambulation/Gait Ambulation/Gait assistance: Contact guard assist Gait Distance (Feet): 40 Feet Assistive device: Rolling walker (2 wheels) (Youth height) Gait Pattern/deviations: Antalgic, Trunk flexed, Wide base of support, Decreased stride length, Step-to pattern Gait velocity: decreased     General Gait Details: Significant R knee valgus and slight L knee varis affecting upright posture due to length descrepency from mis-aligned joint line and compounded by kyphosis and cervical flexion  Stairs            Wheelchair Mobility     Tilt Bed    Modified Rankin (Stroke Patients Only)       Balance Overall balance assessment: Needs assistance Sitting-balance support: Feet supported, No upper extremity supported Sitting balance-Leahy Scale: Good     Standing balance support: Bilateral upper extremity supported, During functional activity, Reliant on assistive device for balance Standing balance-Leahy Scale:  Fair                               Pertinent Vitals/Pain Pain Assessment Pain Assessment: Faces Faces Pain Scale: Hurts little more Pain Location: bilateral knees Pain Descriptors / Indicators: Aching, Discomfort Pain Intervention(s): Limited activity within patient's tolerance,  Monitored during session, Repositioned    Home Living Family/patient expects to be discharged to:: Private residence Living Arrangements: Spouse/significant other Available Help at Discharge: Family;Available PRN/intermittently Type of Home: Other(Comment) (condo) Home Access: Stairs to enter Entrance Stairs-Rails: Left Entrance Stairs-Number of Steps: 4   Home Layout: One level Home Equipment: Rollator (4 wheels);Shower seat;BSC/3in1;Wheelchair - manual;Grab bars - tub/shower;Cane - single Librarian, academic (2 wheels);Transport chair      Prior Function Prior Level of Function : Needs assist       Physical Assist : ADLs (physical)     Mobility Comments: walks with rollator; denies falls in past 6 months ADLs Comments: A for IADLs no longer driving hx of peripherial neuropathy     Extremity/Trunk Assessment        Lower Extremity Assessment Lower Extremity Assessment: Generalized weakness    Cervical / Trunk Assessment Cervical / Trunk Assessment: Normal  Communication   Communication Communication: No apparent difficulties    Cognition Arousal: Alert Behavior During Therapy: WFL for tasks assessed/performed   PT - Cognitive impairments: No apparent impairments                         Following commands: Intact       Cueing Cueing Techniques: Verbal cues, Tactile cues, Visual cues     General Comments      Exercises     Assessment/Plan    PT Assessment Patient needs continued PT services  PT Problem List Decreased strength;Decreased balance;Decreased activity tolerance;Decreased mobility       PT Treatment Interventions DME instruction;Functional mobility training;Balance training;Patient/family education;Gait training;Therapeutic activities;Stair training;Therapeutic exercise;Neuromuscular re-education    PT Goals (Current goals can be found in the Care Plan section)  Acute Rehab PT Goals Patient Stated Goal: return home PT Goal  Formulation: With patient Time For Goal Achievement: 02/13/24 Potential to Achieve Goals: Good    Frequency Min 3X/week     Co-evaluation               AM-PAC PT 6 Clicks Mobility  Outcome Measure Help needed turning from your back to your side while in a flat bed without using bedrails?: None Help needed moving from lying on your back to sitting on the side of a flat bed without using bedrails?: A Little Help needed moving to and from a bed to a chair (including a wheelchair)?: A Little Help needed standing up from a chair using your arms (e.g., wheelchair or bedside chair)?: A Little Help needed to walk in hospital room?: A Little Help needed climbing 3-5 steps with a railing? : A Lot 6 Click Score: 18    End of Session Equipment Utilized During Treatment: Gait belt Activity Tolerance: Patient tolerated treatment well Patient left: in chair;with call bell/phone within reach;with family/visitor present Nurse Communication: Mobility status PT Visit Diagnosis: Difficulty in walking, not elsewhere classified (R26.2);Unsteadiness on feet (R26.81)    Time: 0981-1914 PT Time Calculation (min) (ACUTE ONLY): 16 min   Charges:   PT Evaluation $PT Eval Low Complexity: 1 Low   PT General Charges $$ ACUTE PT VISIT: 1 Visit  Cary Clarks, PT Acute Rehab   Annalee Kiang 01/30/2024, 12:26 PM

## 2024-01-30 NOTE — Hospital Course (Signed)
 78 year old female with past medical history of stage Ia lung adenocarcinoma status post XRT, chronic abdominal pain and nausea, history of recurrent C. difficile colitis, permanent atrial fibrillation, CKD 3, hypothyroidism presented hospital with shortness of breath cough fever.  Of note patient was recently admitted in May for left-sided pyelonephritis sepsis and AKI.  Patient had gone home with home health physical therapy.  Patient reported that the physical therapist felt sick with respiratory infection.  In the ED patient was hypoxic with pulse ox of 86% on room air.  Chest x-ray showed right middle lobe infiltrate.  Patient was started on antibiotics and was admitted hospital for further evaluation and treatment.    Assessment and Plan:    Acute hypoxemic respiratory failure  Chest x-ray showed right middle lobe infiltrate but had a history of radiation in the past in the same area.  Procalcitonin less than 0.10.  Antibiotics were discontinued 01/27/2024.Aaron Aas Respiratory panel, COVID, influenza and respiratory syncytial virus negative but she still likely has a viral respiratory infection causing her cough and fever.  Weaned to room air 6 7.  Continue Tussionex albuterol .  Blood cultures negative in 5 days.  Abdominal pain-history of C. difficile colitis in 2024 Stool for C. difficile negative.  Continue symptomatic management.    Persistent atrial fibrillation  Continue Eliquis  and Zebeta    CKD stage IIIb No recent BMP.   Hypokalemia - Potassium was 3.2 and has improved to 4.5.  No recent BMP   Normocytic anemia - Hemoglobin ranges from 9-11   Hypothyroidism - Continue Synthroid 

## 2024-01-30 NOTE — Progress Notes (Signed)
 RN informed on call provider, A Chavez, that liquid PO medication was given to patient and patient started to have another severe coughing spell upon swallowing the medication. Patient also stated she felt as if some dinner was still stuck in her throat. A.Chavez ok 'd PO meds being held for tonight. Patient is awaiting swallow eval.

## 2024-01-30 NOTE — Progress Notes (Signed)
 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 difficile colitis, permanent atrial fibrillation, CKD 3, hypothyroidism presented hospital with shortness of breath cough fever.  Of note patient was recently admitted in May for left-sided pyelonephritis sepsis and AKI.  Patient had gone home with home health physical therapy.  Patient reported that the physical therapist felt sick with respiratory infection.  In the ED, patient was hypoxic with pulse ox of 86% on room air.  Chest x-ray showed right middle lobe infiltrate.  Patient was started on antibiotics and was admitted hospital for further evaluation and treatment.     Assessment/Plan: Principal Problem:   Acute hypoxemic respiratory failure (HCC) Active Problems:   Essential hypertension   Persistent atrial fibrillation (HCC)   Acute hypoxemic respiratory failure  Chest x-ray showed right middle lobe infiltrate but had a history of radiation in the past in the same area.  Procalcitonin less than 0.10.  Antibiotics were discontinued 01/27/2024.Aaron Aas Respiratory panel, COVID, influenza and respiratory syncytial virus negative but she still likely has a viral respiratory infection causing her cough and fever.  Weaned to room air 6 7.  Continue Tussionex albuterol .  Blood cultures negative in 5 days.  Patient did have episode of choking like spell this morning with coughing so x-ray was repeated which did not show obvious changes compared to previous.  Will add Protonix .  Abdominal pain-history of C. difficile colitis in 2024 Stool for C. difficile negative.  Continue symptomatic management.    Persistent atrial fibrillation  Continue Eliquis  and Zebeta    CKD stage IIIb No recent BMP.   Hypokalemia -  Potassium was 3.2 and has improved to 4.5.  No recent BMP, check levels in AM.   Normocytic anemia - Hemoglobin ranges from 9-11   Hypothyroidism - Continue Synthroid   DVT prophylaxis:  apixaban  (ELIQUIS ) tablet 5 mg   Disposition: Likely home 6-12- 25  Status is: Inpatient Remains inpatient appropriate because: Pending clinical improvement    Code Status:     Code Status: Full Code  Family Communication: Spoke with the patient's daughter on the phone and updated her about the clinical condition of the patient.  Consultants: None  Procedures: None  Anti-infectives:  None  Anti-infectives (From admission, onward)    Start     Dose/Rate Route Frequency Ordered Stop   01/26/24 1500  cefTRIAXone  (ROCEPHIN ) 1 g in sodium chloride  0.9 % 100 mL IVPB  Status:  Discontinued        1 g 200 mL/hr over 30 Minutes Intravenous Every 24 hours 01/25/24 1701 01/26/24 1417   01/25/24 2200  vancomycin  (VANCOCIN ) capsule 125 mg  Status:  Discontinued        125 mg Oral 2 times daily 01/25/24 1644 01/28/24 1619   01/25/24 1345  azithromycin  (ZITHROMAX ) 500 mg in sodium chloride  0.9 % 250 mL IVPB  Status:  Discontinued        500 mg 250 mL/hr over 60 Minutes Intravenous Every 24 hours 01/25/24 1338 01/26/24 1417   01/25/24 1345  cefTRIAXone  (ROCEPHIN ) 1 g in sodium chloride  0.9 % 100 mL IVPB        1 g 200 mL/hr over 30 Minutes Intravenous  Once 01/25/24 1338 01/25/24 1558      Subjective: Today, patient was seen  and examined at bedside.  Patient stated that she did have episode of severe coughing this morning around 3 AM which lasted for 1 hour and had severe choking sensation which sounded like acid reflux.    Objective: Vitals:   01/30/24 0511 01/30/24 1314  BP: 116/79 108/75  Pulse: 85 (!) 105  Resp: 18 17  Temp: 98.3 F (36.8 C) 100 F (37.8 C)  SpO2: 93% 94%    Intake/Output Summary (Last 24 hours) at 01/30/2024 1522 Last data filed at 01/30/2024 1002 Gross per 24 hour   Intake 720 ml  Output --  Net 720 ml   Filed Weights   01/25/24 1150  Weight: 58.1 kg   Body mass index is 20.67 kg/m.   Physical Exam: GENERAL: Patient is alert awake and oriented. .  Elderly female not in obvious distress. HENT: No scleral pallor or icterus. Pupils equally reactive to light. Oral mucosa is moist NECK: is supple, no gross swelling noted. CHEST:  Diminished breath sounds bilaterally.  Coarse breath sounds noted CVS: S1 and S2 heard, no murmur. Regular rate and rhythm.  ABDOMEN: Soft, non-tender, bowel sounds are present. EXTREMITIES: No edema. CNS: Cranial nerves are intact. No focal motor deficits. SKIN: warm and dry without rashes.  Data Review: I have personally reviewed the following laboratory data and studies,  CBC: Recent Labs  Lab 01/25/24 1318 01/26/24 0741 01/27/24 0659  WBC 4.2 3.1* 3.0*  NEUTROABS 3.0  --   --   HGB 10.6* 9.7* 10.5*  HCT 35.2* 33.4* 35.4*  MCV 91.7 95.4 93.9  PLT 210 164 163   Basic Metabolic Panel: Recent Labs  Lab 01/25/24 1318 01/26/24 0741 01/27/24 0659  NA 141 135 137  K 3.2* 4.5 4.0  CL 113* 104 107  CO2 18* 24 22  GLUCOSE 73 90 96  BUN 13 13 12   CREATININE 1.06* 1.08* 1.05*  CALCIUM  7.1* 9.0 9.4   Liver Function Tests: Recent Labs  Lab 01/26/24 0741  AST 15  ALT 9  ALKPHOS 66  BILITOT 0.9  PROT 6.6  ALBUMIN 3.4*   No results for input(s): LIPASE, AMYLASE in the last 168 hours. No results for input(s): AMMONIA in the last 168 hours. Cardiac Enzymes: No results for input(s): CKTOTAL, CKMB, CKMBINDEX, TROPONINI in the last 168 hours. BNP (last 3 results) No results for input(s): BNP in the last 8760 hours.  ProBNP (last 3 results) No results for input(s): PROBNP in the last 8760 hours.  CBG: No results for input(s): GLUCAP in the last 168 hours. Recent Results (from the past 240 hours)  Resp panel by RT-PCR (RSV, Flu A&B, Covid) Anterior Nasal Swab     Status: None    Collection Time: 01/25/24  1:18 PM   Specimen: Anterior Nasal Swab  Result Value Ref Range Status   SARS Coronavirus 2 by RT PCR NEGATIVE NEGATIVE Final    Comment: (NOTE) SARS-CoV-2 target nucleic acids are NOT DETECTED.  The SARS-CoV-2 RNA is generally detectable in upper respiratory specimens during the acute phase of infection. The lowest concentration of SARS-CoV-2 viral copies this assay can detect is 138 copies/mL. A negative result does not preclude SARS-Cov-2 infection and should not be used as the sole basis for treatment or other patient management decisions. A negative result may occur with  improper specimen collection/handling, submission of specimen other than nasopharyngeal swab, presence of viral mutation(s) within the areas targeted by this assay, and inadequate number of viral copies(<138 copies/mL). A negative result must  be combined with clinical observations, patient history, and epidemiological information. The expected result is Negative.  Fact Sheet for Patients:  BloggerCourse.com  Fact Sheet for Healthcare Providers:  SeriousBroker.it  This test is no t yet approved or cleared by the [place]  FDA and  has been authorized for detection and/or diagnosis of SARS-CoV-2 by FDA under an Emergency Use Authorization (EUA). This EUA will remain  in effect (meaning this test can be used) for the duration of the COVID-19 declaration under Section 564(b)(1) of the Act, 21 U.S.C.section 360bbb-3(b)(1), unless the authorization is terminated  or revoked sooner.       Influenza A by PCR NEGATIVE NEGATIVE Final   Influenza B by PCR NEGATIVE NEGATIVE Final    Comment: (NOTE) The Xpert Xpress SARS-CoV-2/FLU/RSV plus assay is intended as an aid in the diagnosis of influenza from Nasopharyngeal swab specimens and should not be used as a sole basis for treatment. Nasal washings and aspirates are unacceptable for  Xpert Xpress SARS-CoV-2/FLU/RSV testing.  Fact Sheet for Patients: BloggerCourse.com  Fact Sheet for Healthcare Providers: SeriousBroker.it  This test is not yet approved or cleared by the [place]  FDA and has been authorized for detection and/or diagnosis of SARS-CoV-2 by FDA under an Emergency Use Authorization (EUA). This EUA will remain in effect (meaning this test can be used) for the duration of the COVID-19 declaration under Section 564(b)(1) of the Act, 21 U.S.C. section 360bbb-3(b)(1), unless the authorization is terminated or revoked.     Resp Syncytial Virus by PCR NEGATIVE NEGATIVE Final    Comment: (NOTE) Fact Sheet for Patients: BloggerCourse.com  Fact Sheet for Healthcare Providers: SeriousBroker.it  This test is not yet approved or cleared by the [place]  FDA and has been authorized for detection and/or diagnosis of SARS-CoV-2 by FDA under an Emergency Use Authorization (EUA). This EUA will remain in effect (meaning this test can be used) for the duration of the COVID-19 declaration under Section 564(b)(1) of the Act, 21 U.S.C. section 360bbb-3(b)(1), unless the authorization is terminated or revoked.  Performed at [facility], [address], [place], [place] [address]   Culture, blood (routine x 2)     Status: None   Collection Time: 01/25/24  3:00 PM   Specimen: BLOOD  Result Value Ref Range Status   Specimen Description   Final    BLOOD LEFT ANTECUBITAL Performed at [facility], [address] [place], [place] [address]    Special Requests   Final    BOTTLES DRAWN AEROBIC ONLY Blood Culture results may not be optimal due to an inadequate volume of blood received in culture bottles Performed at [facility], [address], [place], [place] [address]    Culture   Final    NO GROWTH 5  DAYS Performed at [facility], [address], [place], [place] [address]    Report Status 01/30/2024 FINAL  Final  Culture, blood (routine x 2)     Status: None   Collection Time: 01/25/24  3:18 PM   Specimen: BLOOD  Result Value Ref Range Status   Specimen Description   Final    BLOOD RIGHT ANTECUBITAL Performed at [facility], [address], [place], [place] [address]    Special Requests   Final    BOTTLES DRAWN AEROBIC ONLY Blood Culture results may not be optimal due to an inadequate volume of blood received in culture bottles Performed at [facility], [address] [place], [place] [address]    Culture   Final    NO GROWTH 5 DAYS Performed at [facility], [address], [place], [place] [address]    Report Status 01/30/2024 FINAL  Final  C Difficile Quick Screen w PCR reflex     Status: None   Collection Time: 01/27/24  6:24 PM   Specimen: STOOL  Result Value Ref Range Status   C Diff antigen NEGATIVE NEGATIVE Final   C Diff toxin NEGATIVE NEGATIVE Final   C Diff interpretation No C. difficile detected.  Final    Comment: Performed at [facility], [address], [place], [place] [address]     Studies: DG CHEST PORT 1 VIEW Result Date: 01/30/2024 CLINICAL DATA:  Dyspnea EXAM: PORTABLE CHEST 1 VIEW COMPARISON:  X-ray 01/25/2024 FINDINGS: Hyperinflation. Left lung is grossly clear. There is persistent mild opacity in the right midlung, relatively similar to previous with some increasing at the right lung base. Apical pleural thickening. No pneumothorax or edema. Enlarged cardiopericardial silhouette. Calcified aorta. Film is rotated to the right. Old right clavicle fracture. Film is under penetrated. IMPRESSION: Slight increased opacity at the right lung base. Persistent right midlung opacity. Hyperinflation. Recommend continued follow-up Electronically Signed   By: Adrianna Horde M.D.   On: 01/30/2024  13:38      Rosena Conradi, MD  [facility] 01/30/2024  If 7PM-7AM, please contact night-coverage

## 2024-01-30 NOTE — Plan of Care (Signed)

## 2024-01-31 DIAGNOSIS — J9601 Acute respiratory failure with hypoxia: Secondary | ICD-10-CM | POA: Diagnosis not present

## 2024-01-31 LAB — BASIC METABOLIC PANEL WITH GFR
Anion gap: 9 (ref 5–15)
BUN: 25 mg/dL — ABNORMAL HIGH (ref 8–23)
CO2: 21 mmol/L — ABNORMAL LOW (ref 22–32)
Calcium: 9.9 mg/dL (ref 8.9–10.3)
Chloride: 109 mmol/L (ref 98–111)
Creatinine, Ser: 1.55 mg/dL — ABNORMAL HIGH (ref 0.44–1.00)
GFR, Estimated: 34 mL/min — ABNORMAL LOW (ref >60)
Glucose, Bld: 83 mg/dL (ref 70–99)
Potassium: 4.1 mmol/L (ref 3.5–5.1)
Sodium: 139 mmol/L (ref 135–145)

## 2024-01-31 LAB — CBC
HCT: 37.6 % (ref 36.0–46.0)
Hemoglobin: 10.9 g/dL — ABNORMAL LOW (ref 12.0–15.0)
MCH: 27.6 pg (ref 26.0–34.0)
MCHC: 29 g/dL — ABNORMAL LOW (ref 30.0–36.0)
MCV: 95.2 fL (ref 80.0–100.0)
Platelets: 284 10*3/uL (ref 150–400)
RBC: 3.95 MIL/uL (ref 3.87–5.11)
RDW: 14.2 % (ref 11.5–15.5)
WBC: 6.1 10*3/uL (ref 4.0–10.5)
nRBC: 0 % (ref 0.0–0.2)

## 2024-01-31 LAB — MAGNESIUM: Magnesium: 1.9 mg/dL (ref 1.7–2.4)
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
## 2024-01-31 NOTE — Plan of Care (Signed)
  Problem: Education: Goal: Knowledge of General Education information will improve Description: Including pain rating scale, medication(s)/side effects and non-pharmacologic comfort measures Outcome: Progressing   Problem: Clinical Measurements: Goal: Diagnostic test results will improve Outcome: Progressing   Problem: Nutrition: Goal: Adequate nutrition will be maintained Outcome: Progressing   Problem: Coping: Goal: Level of anxiety will decrease Outcome: Progressing   Problem: Elimination: Goal: Will not experience complications related to bowel motility Outcome: Progressing Goal: Will not experience complications related to urinary retention Outcome: Progressing

## 2024-01-31 NOTE — Progress Notes (Addendum)
 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 difficile colitis, permanent atrial fibrillation, CKD 3, hypothyroidism presented hospital with shortness of breath cough fever.  Of note patient was recently admitted in May for left-sided pyelonephritis sepsis and AKI.  Patient had gone home with home health physical therapy.  Patient reported that the physical therapist felt sick with respiratory infection.  In the ED, patient was hypoxic with pulse ox of 86% on room air.  Chest x-ray showed right middle lobe infiltrate.  Patient was started on antibiotics and was admitted hospital for further evaluation and treatment.     Assessment/Plan: Principal Problem:   Acute hypoxemic respiratory failure (HCC) Active Problems:   Essential hypertension   Persistent atrial fibrillation (HCC)   Acute hypoxemic respiratory failure secondary to viral pneumonia Chest x-ray showed right middle lobe infiltrate but had a history of radiation in the past in the same area.  Procalcitonin less than 0.10.  Antibiotics were discontinued 01/27/2024.Aaron Aas Respiratory panel, COVID, influenza and respiratory syncytial virus negative but she still likely has a viral respiratory infection causing her cough and fever.  Weaned to room air 6/ 7.  Continue Tussionex albuterol .  Blood cultures negative in 5 days.  Underwent thin swallow evaluation and recommend regular consistency diet.  Abdominal pain-history of C. difficile colitis in 2024 Stool for C. difficile negative.      Persistent atrial fibrillation  Continue Eliquis  and Zebeta    CKD stage IIIb No recent BMP.   Hypokalemia Improved after repletion.  Latest potassium of 4.1   Normocytic anemia - Hemoglobin ranges from 9-11    Hypothyroidism - Continue Synthroid    DVT prophylaxis:  apixaban  (ELIQUIS ) tablet 5 mg   Disposition: Likely home 6-13- 25  Status is: Inpatient Remains inpatient appropriate because: Pending clinical improvement    Code Status:     Code Status: Full Code  Family Communication: Spoke with the patient's daughter on the phone on 01/30/2024  Consultants: None  Procedures: None  Anti-infectives:  None  Anti-infectives (From admission, onward)    Start     Dose/Rate Route Frequency Ordered Stop   01/26/24 1500  cefTRIAXone  (ROCEPHIN ) 1 g in sodium chloride  0.9 % 100 mL IVPB  Status:  Discontinued        1 g 200 mL/hr over 30 Minutes Intravenous Every 24 hours 01/25/24 1701 01/26/24 1417   01/25/24 2200  vancomycin  (VANCOCIN ) capsule 125 mg  Status:  Discontinued        125 mg Oral 2 times daily 01/25/24 1644 01/28/24 1619   01/25/24 1345  azithromycin  (ZITHROMAX ) 500 mg in sodium chloride  0.9 % 250 mL IVPB  Status:  Discontinued        500 mg 250 mL/hr over 60 Minutes Intravenous Every 24 hours 01/25/24 1338 01/26/24 1417   01/25/24 1345  cefTRIAXone  (ROCEPHIN ) 1 g in sodium chloride  0.9 % 100 mL IVPB        1 g 200 mL/hr over 30 Minutes Intravenous  Once 01/25/24 1338 01/25/24 1558      Subjective: Today, patient was seen and examined at bedside.  Patient states that she feels overall better.  Denies any pain, nausea, vomiting, fever or chills.  Still has some cough and has had a bowel movement.  Had a  speech evaluation done.  Objective: Vitals:   01/31/24 0551 01/31/24 1305  BP: 130/76 121/74  Pulse: (!) 57 66  Resp: 18 18  Temp: 98.5 F (36.9 C) 98.7 F (37.1 C)  SpO2: 92% 96%    Intake/Output Summary (Last 24 hours) at 01/31/2024 1549 Last data filed at 01/31/2024 0942 Gross per 24 hour  Intake 360 ml  Output --  Net 360 ml   Filed Weights   01/25/24 1150  Weight: 58.1 kg   Body mass index is 20.67 kg/m.   Physical Exam:  GENERAL: Patient is  alert awake and oriented. .  Elderly female not in obvious distress. HENT: No scleral pallor or icterus. Pupils equally reactive to light. Oral mucosa is moist NECK: is supple, no gross swelling noted. CHEST:  Diminished breath sounds bilaterally.   CVS: S1 and S2 heard, no murmur. Regular rate and rhythm.  ABDOMEN: Soft, non-tender, bowel sounds are present. EXTREMITIES: No edema. CNS: Cranial nerves are intact. No focal motor deficits. SKIN: warm and dry without rashes.  Data Review: I have personally reviewed the following laboratory data and studies,  CBC: Recent Labs  Lab 01/25/24 1318 01/26/24 0741 01/27/24 0659 01/31/24 0626  WBC 4.2 3.1* 3.0* 6.1  NEUTROABS 3.0  --   --   --   HGB 10.6* 9.7* 10.5* 10.9*  HCT 35.2* 33.4* 35.4* 37.6  MCV 91.7 95.4 93.9 95.2  PLT 210 164 163 284   Basic Metabolic Panel: Recent Labs  Lab 01/25/24 1318 01/26/24 0741 01/27/24 0659 01/31/24 0626  NA 141 135 137 139  K 3.2* 4.5 4.0 4.1  CL 113* 104 107 109  CO2 18* 24 22 21*  GLUCOSE 73 90 96 83  BUN 13 13 12  25*  CREATININE 1.06* 1.08* 1.05* 1.55*  CALCIUM  7.1* 9.0 9.4 9.9  MG  --   --   --  1.9   Liver Function Tests: Recent Labs  Lab 01/26/24 0741  AST 15  ALT 9  ALKPHOS 66  BILITOT 0.9  PROT 6.6  ALBUMIN 3.4*   No results for input(s): LIPASE, AMYLASE in the last 168 hours. No results for input(s): AMMONIA in the last 168 hours. Cardiac Enzymes: No results for input(s): CKTOTAL, CKMB, CKMBINDEX, TROPONINI in the last 168 hours. BNP (last 3 results) No results for input(s): BNP in the last 8760 hours.  ProBNP (last 3 results) No results for input(s): PROBNP in the last 8760 hours.  CBG: No results for input(s): GLUCAP in the last 168 hours. Recent Results (from the past 240 hours)  Resp panel by RT-PCR (RSV, Flu A&B, Covid) Anterior Nasal Swab     Status: None   Collection Time: 01/25/24  1:18 PM   Specimen: Anterior Nasal Swab  Result Value  Ref Range Status   SARS Coronavirus 2 by RT PCR NEGATIVE NEGATIVE Final    Comment: (NOTE) SARS-CoV-2 target nucleic acids are NOT DETECTED.  The SARS-CoV-2 RNA is generally detectable in upper respiratory specimens during the acute phase of infection. The lowest concentration of SARS-CoV-2 viral copies this assay can detect is 138 copies/mL. A negative result does not preclude SARS-Cov-2 infection and should not be used as the sole basis for treatment or other patient management decisions. A negative result may occur with  improper specimen collection/handling, submission of specimen other than nasopharyngeal swab, presence of viral mutation(s) within the areas targeted by this assay, and inadequate number of viral copies(<138 copies/mL). A negative result must be combined with clinical  observations, patient history, and epidemiological information. The expected result is Negative.  Fact Sheet for Patients:  BloggerCourse.com  Fact Sheet for Healthcare Providers:  SeriousBroker.it  This test is no t yet approved or cleared by the [place]  FDA and  has been authorized for detection and/or diagnosis of SARS-CoV-2 by FDA under an Emergency Use Authorization (EUA). This EUA will remain  in effect (meaning this test can be used) for the duration of the COVID-19 declaration under Section 564(b)(1) of the Act, 21 U.S.C.section 360bbb-3(b)(1), unless the authorization is terminated  or revoked sooner.       Influenza A by PCR NEGATIVE NEGATIVE Final   Influenza B by PCR NEGATIVE NEGATIVE Final    Comment: (NOTE) The Xpert Xpress SARS-CoV-2/FLU/RSV plus assay is intended as an aid in the diagnosis of influenza from Nasopharyngeal swab specimens and should not be used as a sole basis for treatment. Nasal washings and aspirates are unacceptable for Xpert Xpress SARS-CoV-2/FLU/RSV testing.  Fact Sheet for  Patients: BloggerCourse.com  Fact Sheet for Healthcare Providers: SeriousBroker.it  This test is not yet approved or cleared by the [place]  FDA and has been authorized for detection and/or diagnosis of SARS-CoV-2 by FDA under an Emergency Use Authorization (EUA). This EUA will remain in effect (meaning this test can be used) for the duration of the COVID-19 declaration under Section 564(b)(1) of the Act, 21 U.S.C. section 360bbb-3(b)(1), unless the authorization is terminated or revoked.     Resp Syncytial Virus by PCR NEGATIVE NEGATIVE Final    Comment: (NOTE) Fact Sheet for Patients: BloggerCourse.com  Fact Sheet for Healthcare Providers: SeriousBroker.it  This test is not yet approved or cleared by the [place]  FDA and has been authorized for detection and/or diagnosis of SARS-CoV-2 by FDA under an Emergency Use Authorization (EUA). This EUA will remain in effect (meaning this test can be used) for the duration of the COVID-19 declaration under Section 564(b)(1) of the Act, 21 U.S.C. section 360bbb-3(b)(1), unless the authorization is terminated or revoked.  Performed at [facility], [address], [place], [place] [address]   Culture, blood (routine x 2)     Status: None   Collection Time: 01/25/24  3:00 PM   Specimen: BLOOD  Result Value Ref Range Status   Specimen Description   Final    BLOOD LEFT ANTECUBITAL Performed at [facility], [address] [place], [place] [address]    Special Requests   Final    BOTTLES DRAWN AEROBIC ONLY Blood Culture results may not be optimal due to an inadequate volume of blood received in culture bottles Performed at [facility], [address], [place], [place] [address]    Culture   Final    NO GROWTH 5 DAYS Performed at [facility], [address], [place], [place] [address]    Report Status 01/30/2024 FINAL  Final  Culture, blood (routine x 2)     Status: None   Collection Time: 01/25/24  3:18 PM   Specimen: BLOOD  Result Value Ref Range Status   Specimen Description   Final    BLOOD RIGHT ANTECUBITAL Performed at [facility], [address], [place], [place] [address]    Special Requests   Final    BOTTLES DRAWN AEROBIC ONLY Blood Culture results may not be optimal due to an inadequate volume of blood received in culture bottles Performed at [facility], [address] [place],  [place] [address]    Culture   Final    NO GROWTH 5 DAYS Performed at [facility], [address], [place], [place] [address]    Report Status 01/30/2024 FINAL  Final  C Difficile Quick Screen w PCR reflex     Status: None   Collection Time: 01/27/24  6:24 PM   Specimen: STOOL  Result Value Ref Range Status   C Diff antigen NEGATIVE NEGATIVE Final   C Diff toxin NEGATIVE NEGATIVE Final   C Diff interpretation No C. difficile detected.  Final    Comment: Performed at [facility], [address], [place], [place] [address]     Studies: DG CHEST PORT 1 VIEW Result Date: 01/30/2024 CLINICAL DATA:  Dyspnea EXAM: PORTABLE CHEST 1 VIEW COMPARISON:  X-ray 01/25/2024 FINDINGS: Hyperinflation. Left lung is grossly clear. There is persistent mild opacity in the right midlung, relatively similar to previous with some increasing at the right lung base. Apical pleural thickening. No pneumothorax or edema. Enlarged cardiopericardial silhouette. Calcified aorta. Film is rotated to the right. Old right clavicle fracture. Film is under penetrated. IMPRESSION: Slight increased opacity at the right lung base. Persistent right midlung opacity. Hyperinflation. Recommend continued follow-up Electronically Signed   By: Adrianna Horde M.D.   On: 01/30/2024 13:38      Rosena Conradi, MD  [facility] 01/31/2024  If 7PM-7AM, please contact night-coverage

## 2024-01-31 NOTE — Plan of Care (Signed)

## 2024-02-01 DIAGNOSIS — J9601 Acute respiratory failure with hypoxia: Secondary | ICD-10-CM | POA: Diagnosis not present

## 2024-02-01 MED ORDER — HYDROCOD POLI-CHLORPHE POLI ER 10-8 MG/5ML PO SUER: 5.0000 mL | Freq: Two times a day (BID) | ORAL | 0 refills | Status: DC

## 2024-02-01 MED ORDER — GUAIFENESIN-DM 100-10 MG/5ML PO SYRP: 5.0000 mL | ORAL_SOLUTION | ORAL | 0 refills | Status: DC

## 2024-02-01 MED ORDER — METHYLPREDNISOLONE SODIUM SUCC 40 MG IJ SOLR
40.0000 mg | Freq: Once | INTRAMUSCULAR | Status: AC
  Administered 2024-02-01: 40 mg via INTRAMUSCULAR

## 2024-02-01 MED ORDER — ALBUTEROL SULFATE HFA 108 (90 BASE) MCG/ACT IN AERS: 2.0000 | INHALATION_SPRAY | Freq: Four times a day (QID) | RESPIRATORY_TRACT | 1 refills | Status: AC | PRN

## 2024-02-01 MED ORDER — PREDNISONE 10 MG PO TABS: 20.0000 mg | ORAL_TABLET | Freq: Every day | ORAL | 0 refills | Status: AC

## 2024-02-01 MED ORDER — METHYLPREDNISOLONE SODIUM SUCC 40 MG IJ SOLR
40.0000 mg | Freq: Once | INTRAMUSCULAR | Status: DC
  Filled 2024-02-01: qty 1

## 2024-02-01 NOTE — TOC Transition Note (Signed)
 Transition of Care [facility]) - Discharge Note   Patient Details  Name: [name] MRN: [id number] Date of Birth: [birth year]  Transition of Care [facility]) CM/SW Contact:  Levie Ream, RN Phone Number: 02/01/2024, 9:58 AM   Clinical Narrative:    D/C orders received; pt active w/ [facility]/OT; Brandi at [facility] notified; no TOC needs.   Final next level of care: Home w Home Health Services Barriers to Discharge: No Barriers Identified   Patient Goals and CMS Choice Patient states their goals for this hospitalization and ongoing recovery are:: return home   Choice offered to / list presented to : NA      Discharge Placement                       Discharge Plan and Services Additional resources added to the After Visit Summary for   In-house Referral: NA Discharge Planning Services: NA            DME Arranged: N/A DME Agency: NA       HH Arranged: NA HH Agency: NA        Social Drivers of Health (SDOH) Interventions SDOH Screenings   Food Insecurity: No Food Insecurity (01/25/2024)  Housing: Low Risk  (01/25/2024)  Transportation Needs: No Transportation Needs (01/25/2024)  Utilities: Not At Risk (01/25/2024)  Depression (PHQ2-9): Low Risk  (02/28/2023)  Social Connections: Socially Integrated (01/25/2024)  Tobacco Use: Medium Risk (01/25/2024)     Readmission Risk Interventions    01/27/2024   10:41 AM 01/03/2024   12:11 PM 05/23/2023   10:20 AM  Readmission Risk Prevention Plan  Transportation Screening Complete Complete Complete  PCP or Specialist Appt within 5-7 Days Complete  Complete  PCP or Specialist Appt within 3-5 Days  Complete   Home Care Screening Complete  Complete  Medication Review (RN CM) Complete  Complete  HRI or Home Care Consult  Complete   Social Work Consult for Recovery Care Planning/Counseling  Complete   Palliative Care Screening  Complete   Medication Review Oceanographer)  Complete

## 2024-02-01 NOTE — Plan of Care (Signed)

## 2024-02-01 NOTE — Plan of Care (Signed)
 Went over discharge instructions with pt and pts husband. They both verbalized understanding. PT vitals are stable, pt going by private vehicle.    Problem: Education: Goal: Knowledge of General Education information will improve Description: Including pain rating scale, medication(s)/side effects and non-pharmacologic comfort measures Outcome: Adequate for Discharge   Problem: Health Behavior/Discharge Planning: Goal: Ability to manage health-related needs will improve Outcome: Adequate for Discharge   Problem: Clinical Measurements: Goal: Ability to maintain clinical measurements within normal limits will improve Outcome: Adequate for Discharge Goal: Will remain free from infection Outcome: Adequate for Discharge Goal: Diagnostic test results will improve Outcome: Adequate for Discharge Goal: Respiratory complications will improve Outcome: Adequate for Discharge Goal: Cardiovascular complication will be avoided Outcome: Adequate for Discharge   Problem: Activity: Goal: Risk for activity intolerance will decrease Outcome: Adequate for Discharge   Problem: Nutrition: Goal: Adequate nutrition will be maintained Outcome: Adequate for Discharge   Problem: Coping: Goal: Level of anxiety will decrease Outcome: Adequate for Discharge   Problem: Elimination: Goal: Will not experience complications related to bowel motility Outcome: Adequate for Discharge Goal: Will not experience complications related to urinary retention Outcome: Adequate for Discharge   Problem: Pain Managment: Goal: General experience of comfort will improve and/or be controlled Outcome: Adequate for Discharge   Problem: Safety: Goal: Ability to remain free from injury will improve Outcome: Adequate for Discharge   Problem: Skin Integrity: Goal: Risk for impaired skin integrity will decrease Outcome: Adequate for Discharge

## 2024-02-01 NOTE — Discharge Summary (Signed)
 Physician Discharge Summary  [name] NFA:[id number] DOB: 25-Nov-1945 DOA: 01/25/2024  PCP: Avva, Ravisankar, MD  Admit date: 01/25/2024 Discharge date: 02/01/2024  Admitted From: Home  Discharge disposition: Home   Recommendations for Outpatient Follow-Up:   Follow up with your primary care provider in one week.  Check CBC, BMP, magnesium  in the next visit  Discharge Diagnosis:   Principal Problem:   Acute hypoxemic respiratory failure (HCC) Active Problems:   Essential hypertension   Persistent atrial fibrillation (HCC)   Discharge Condition: Improved.  Diet recommendation: Low sodium, heart healthy.    Wound care: None.  Code status: Full.   History of Present Illness:   78 year old female with past medical history of stage Ia lung adenocarcinoma status post XRT, chronic abdominal pain and nausea, history of recurrent C. difficile colitis, permanent atrial fibrillation, CKD 3, hypothyroidism presented hospital with shortness of breath cough fever. Of note patient was recently admitted in May for left-sided pyelonephritis sepsis and AKI. Patient had gone home with home health physical therapy. Patient reported that the physical therapist felt sick with respiratory infection. In the ED, patient was hypoxic with pulse ox of 86% on room air. Chest x-ray showed right middle lobe infiltrate. Patient was started on antibiotics and was admitted hospital for further evaluation and treatment.   Hospital Course:   Following conditions were addressed during hospitalization as listed below,  Acute hypoxemic respiratory failure secondary to viral pneumonia Chest x-ray showed right middle lobe infiltrate but had a history of radiation in the past in the same area.  Procalcitonin less than 0.10.  Antibiotics were discontinued 01/27/2024.Aaron Aas Respiratory panel, COVID, influenza and respiratory syncytial virus negative but she still likely has a viral respiratory infection causing her to  have cough. Weaned to room air 6/ 7.  Continue Tussionex albuterol .  Blood cultures negative in 5 days.  Underwent swallow evaluation and recommend regular consistency diet.  Will continue antitussives on discharge.  Add short course of prednisone due to previous history of radiation treatment and possible radiation pneumonitis.  Will give 1 dose of IV Solu-Medrol  prior to discharge.   Abdominal pain-history of C. difficile colitis in 2024. Stool for C. difficile negative.  No bowel issues at this time.    Persistent atrial fibrillation  Continue Eliquis  and Zebeta    CKD stage IIIb Latest creatinine of 1.5.   Hypokalemia Improved after repletion.  Latest potassium of 4.1   Normocytic anemia - Hemoglobin ranges from 9-11   Hypothyroidism - Continue Synthroid     Disposition.  At this time, patient is stable for disposition home with outpatient PCP follow-up.  Communicated with the patient's spouse at bedside and daughter on the phone.  Medical Consultants:   None.  Procedures:    None Subjective:   Today, patient was seen and examined at bedside.  Complains of cough.  Denies any chest pain, fever, chills or rigor.  Patient's spouse at bedside.  Discharge Exam:   Vitals:   01/31/24 2034 02/01/24 0509  BP: 118/64 127/72  Pulse: 63 60  Resp: 16 14  Temp: 97.9 F (36.6 C) 99 F (37.2 C)  SpO2: 95% 95%   Vitals:   01/31/24 0551 01/31/24 1305 01/31/24 2034 02/01/24 0509  BP: 130/76 121/74 118/64 127/72  Pulse: (!) 57 66 63 60  Resp: 18 18 16 14   Temp: 98.5 F (36.9 C) 98.7 F (37.1 C) 97.9 F (36.6 C) 99 F (37.2 C)  TempSrc: Oral Oral Oral Oral  SpO2: 92% 96%  95% 95%  Weight:      Height:       Body mass index is 20.67 kg/m.  General: Alert awake, not in obvious distress elderly female, appears weak and deconditioned, HENT: pupils equally reacting to light,  No scleral pallor or icterus noted. Oral mucosa is moist.  Chest:  Clear breath sounds.  Diminished  breath sounds bilaterally.  CVS: S1 &S2 heard. No murmur.  Regular rate and rhythm. Abdomen: Soft, nontender, nondistended.  Bowel sounds are heard.   Extremities: No cyanosis, clubbing or edema.  Peripheral pulses are palpable. Psych: Alert, awake and oriented, normal mood CNS:  No cranial nerve deficits.  Power equal in all extremities.   Skin: Warm and dry.  No rashes noted.  The results of significant diagnostics from this hospitalization (including imaging, microbiology, ancillary and laboratory) are listed below for reference.     Diagnostic Studies:   DG Chest 2 View Result Date: 01/25/2024 CLINICAL DATA:  Suspected pneumonia EXAM: CHEST - 2 VIEW COMPARISON:  Jan 02, 2024 FINDINGS: Comparison with prior examination there is persistent right mid lung atelectasis with more consolidation may indicate residual infiltrates and may correlate with pneumonia. Left lung clear Heart and mediastinum normal IMPRESSION: Residual right mid lung infiltrates and atelectasis could correlate with residual pneumonic infiltrates. Electronically Signed   By: Fredrich Jefferson M.D.   On: 01/25/2024 13:08     Labs:   Basic Metabolic Panel: Recent Labs  Lab 01/25/24 1318 01/26/24 0741 01/27/24 0659 01/31/24 0626  NA 141 135 137 139  K 3.2* 4.5 4.0 4.1  CL 113* 104 107 109  CO2 18* 24 22 21*  GLUCOSE 73 90 96 83  BUN 13 13 12  25*  CREATININE 1.06* 1.08* 1.05* 1.55*  CALCIUM  7.1* 9.0 9.4 9.9  MG  --   --   --  1.9   GFR Estimated Creatinine Clearance: 27.4 mL/min (A) (by C-G formula based on SCr of 1.55 mg/dL (H)). Liver Function Tests: Recent Labs  Lab 01/26/24 0741  AST 15  ALT 9  ALKPHOS 66  BILITOT 0.9  PROT 6.6  ALBUMIN 3.4*   No results for input(s): LIPASE, AMYLASE in the last 168 hours. No results for input(s): AMMONIA in the last 168 hours. Coagulation profile No results for input(s): INR, PROTIME in the last 168 hours.  CBC: Recent Labs  Lab 01/25/24 1318  01/26/24 0741 01/27/24 0659 01/31/24 0626  WBC 4.2 3.1* 3.0* 6.1  NEUTROABS 3.0  --   --   --   HGB 10.6* 9.7* 10.5* 10.9*  HCT 35.2* 33.4* 35.4* 37.6  MCV 91.7 95.4 93.9 95.2  PLT 210 164 163 284   Cardiac Enzymes: No results for input(s): CKTOTAL, CKMB, CKMBINDEX, TROPONINI in the last 168 hours. BNP: Invalid input(s): POCBNP CBG: No results for input(s): GLUCAP in the last 168 hours. D-Dimer No results for input(s): DDIMER in the last 72 hours. Hgb A1c No results for input(s): HGBA1C in the last 72 hours. Lipid Profile No results for input(s): CHOL, HDL, LDLCALC, TRIG, CHOLHDL, LDLDIRECT in the last 72 hours. Thyroid  function studies No results for input(s): TSH, T4TOTAL, T3FREE, THYROIDAB in the last 72 hours.  Invalid input(s): FREET3 Anemia work up No results for input(s): VITAMINB12, FOLATE, FERRITIN, TIBC, IRON, RETICCTPCT in the last 72 hours. Microbiology Recent Results (from the past 240 hours)  Resp panel by RT-PCR (RSV, Flu A&B, Covid) Anterior Nasal Swab     Status: None   Collection Time: 01/25/24  1:18 PM   Specimen: Anterior Nasal Swab  Result Value Ref Range Status   SARS Coronavirus 2 by RT PCR NEGATIVE NEGATIVE Final    Comment: (NOTE) SARS-CoV-2 target nucleic acids are NOT DETECTED.  The SARS-CoV-2 RNA is generally detectable in upper respiratory specimens during the acute phase of infection. The lowest concentration of SARS-CoV-2 viral copies this assay can detect is 138 copies/mL. A negative result does not preclude SARS-Cov-2 infection and should not be used as the sole basis for treatment or other patient management decisions. A negative result may occur with  improper specimen collection/handling, submission of specimen other than nasopharyngeal swab, presence of viral mutation(s) within the areas targeted by this assay, and inadequate number of viral copies(<138 copies/mL). A negative result  must be combined with clinical observations, patient history, and epidemiological information. The expected result is Negative.  Fact Sheet for Patients:  BloggerCourse.com  Fact Sheet for Healthcare Providers:  SeriousBroker.it  This test is no t yet approved or cleared by the [place]  FDA and  has been authorized for detection and/or diagnosis of SARS-CoV-2 by FDA under an Emergency Use Authorization (EUA). This EUA will remain  in effect (meaning this test can be used) for the duration of the COVID-19 declaration under Section 564(b)(1) of the Act, 21 U.S.C.section 360bbb-3(b)(1), unless the authorization is terminated  or revoked sooner.       Influenza A by PCR NEGATIVE NEGATIVE Final   Influenza B by PCR NEGATIVE NEGATIVE Final    Comment: (NOTE) The Xpert Xpress SARS-CoV-2/FLU/RSV plus assay is intended as an aid in the diagnosis of influenza from Nasopharyngeal swab specimens and should not be used as a sole basis for treatment. Nasal washings and aspirates are unacceptable for Xpert Xpress SARS-CoV-2/FLU/RSV testing.  Fact Sheet for Patients: BloggerCourse.com  Fact Sheet for Healthcare Providers: SeriousBroker.it  This test is not yet approved or cleared by the [place]  FDA and has been authorized for detection and/or diagnosis of SARS-CoV-2 by FDA under an Emergency Use Authorization (EUA). This EUA will remain in effect (meaning this test can be used) for the duration of the COVID-19 declaration under Section 564(b)(1) of the Act, 21 U.S.C. section 360bbb-3(b)(1), unless the authorization is terminated or revoked.     Resp Syncytial Virus by PCR NEGATIVE NEGATIVE Final    Comment: (NOTE) Fact Sheet for Patients: BloggerCourse.com  Fact Sheet for Healthcare Providers: SeriousBroker.it  This test is  not yet approved or cleared by the [place]  FDA and has been authorized for detection and/or diagnosis of SARS-CoV-2 by FDA under an Emergency Use Authorization (EUA). This EUA will remain in effect (meaning this test can be used) for the duration of the COVID-19 declaration under Section 564(b)(1) of the Act, 21 U.S.C. section 360bbb-3(b)(1), unless the authorization is terminated or revoked.  Performed at [facility], [address] [place], [place] [address]   Culture, blood (routine x 2)     Status: None   Collection Time: 01/25/24  3:00 PM   Specimen: BLOOD  Result Value Ref Range Status   Specimen Description   Final    BLOOD LEFT ANTECUBITAL Performed at [facility], [address], [place], [place] [address]    Special Requests   Final    BOTTLES DRAWN AEROBIC ONLY Blood Culture results may not be optimal due to an inadequate volume of blood received in culture bottles Performed at [facility], [address], [place], [place] [address]  Culture   Final    NO GROWTH 5 DAYS Performed at [facility], [address] [place], [place] [address]    Report Status 01/30/2024 FINAL  Final  Culture, blood (routine x 2)     Status: None   Collection Time: 01/25/24  3:18 PM   Specimen: BLOOD  Result Value Ref Range Status   Specimen Description   Final    BLOOD RIGHT ANTECUBITAL Performed at [facility], [address] [place], [place] [address]    Special Requests   Final    BOTTLES DRAWN AEROBIC ONLY Blood Culture results may not be optimal due to an inadequate volume of blood received in culture bottles Performed at [facility], [address], [place], [place] [address]    Culture   Final    NO GROWTH 5 DAYS Performed at [facility], [address], [place], [place] [address]    Report Status 01/30/2024 FINAL  Final  C Difficile Quick Screen w PCR  reflex     Status: None   Collection Time: 01/27/24  6:24 PM   Specimen: STOOL  Result Value Ref Range Status   C Diff antigen NEGATIVE NEGATIVE Final   C Diff toxin NEGATIVE NEGATIVE Final   C Diff interpretation No C. difficile detected.  Final    Comment: Performed at [facility], [address], [place], [place] [address]     Discharge Instructions:   Discharge Instructions     Call MD for:  difficulty breathing, headache or visual disturbances   Complete by: As directed    Call MD for:  temperature >100.4   Complete by: As directed    Diet - low sodium heart healthy   Complete by: As directed    Discharge instructions   Complete by: As directed    Follow-up with your primary care provider in 1 week.  Seek medical attention for worsening symptoms.   Increase activity slowly   Complete by: As directed       Allergies as of 02/01/2024       Reactions   Penicillins Rash        Medication List     TAKE these medications    acetaminophen  325 MG tablet Commonly known as: TYLENOL  Take 2 tablets (650 mg total) by mouth every 8 (eight) hours. What changed:  when to take this reasons to take this   albuterol  108 (90 Base) MCG/ACT inhaler Commonly known as: VENTOLIN  HFA Inhale 2 puffs into the lungs every 6 (six) hours as needed for wheezing or shortness of breath.   apixaban  5 MG Tabs tablet Commonly known as: ELIQUIS  Take 1 tablet (5 mg total) by mouth 2 (two) times daily.   ascorbic acid 500 MG tablet Commonly known as: VITAMIN C Take 500 mg by mouth daily.   atorvastatin  10 MG tablet Commonly known as: LIPITOR Take 1 tablet (10 mg total) by mouth daily. What changed: when to take this   bisoprolol  5 MG tablet Commonly known as: ZEBETA  Take 2.5 mg by mouth daily.   buPROPion  75 MG tablet Commonly known as: WELLBUTRIN  Take 75 mg by mouth every morning.   chlorpheniramine-HYDROcodone  10-8 MG/5ML Commonly known as: TUSSIONEX Take  5 mLs by mouth every 12 (twelve) hours.   ferrous sulfate  325 (65 FE) MG tablet Take 1 tablet (325 mg total) by mouth daily with breakfast.   FLUoxetine  10 MG capsule Commonly known as: PROZAC  Take  20 mg by mouth at bedtime.   FLUoxetine  20 MG capsule Commonly known as: PROZAC  Take 1 capsule (20 mg total) by mouth at bedtime.   gabapentin  600 MG tablet Commonly known as: NEURONTIN  Take 600 mg by mouth See admin instructions. Take 600 mg by mouth in the morning & at bedtime and an additional 600 mg once a day as needed for pain   guaiFENesin -dextromethorphan  100-10 MG/5ML syrup Commonly known as: ROBITUSSIN DM Take 5 mLs by mouth every 4 (four) hours while awake.   levothyroxine  88 MCG tablet Commonly known as: Synthroid  Take 1 tablet (88 mcg total) by mouth daily before breakfast.   loratadine  10 MG tablet Commonly known as: CLARITIN  Take 10 mg by mouth daily as needed for allergies or rhinitis.   omeprazole  20 MG capsule Commonly known as: PRILOSEC  Take 1 capsule (20 mg total) by mouth 2 (two) times daily before a meal.   ondansetron  4 MG disintegrating tablet Commonly known as: ZOFRAN -ODT Take 1 tablet (4 mg total) by mouth every 8 (eight) hours as needed for nausea or vomiting. What changed: reasons to take this   predniSONE 10 MG tablet Commonly known as: DELTASONE Take 2 tablets (20 mg total) by mouth daily with breakfast for 5 days.   saccharomyces boulardii 250 MG capsule Commonly known as: Florastor Take 1 capsule (250 mg total) by mouth 2 (two) times daily.   Vitamin D3 1000 units Caps Take 1,000 Units by mouth daily.        Follow-up Information     Avva, Ravisankar, MD Follow up in 1 week(s).   Specialty: Internal Medicine Contact information: [address] [place] [address] [phone]                  Time coordinating discharge: 39 minutes  Signed:  Michaelene Dutan  [facility] 02/01/2024, 9:03 AM

## 2024-02-03 DIAGNOSIS — N179 Acute kidney failure, unspecified: Secondary | ICD-10-CM | POA: Diagnosis not present

## 2024-02-03 DIAGNOSIS — J449 Chronic obstructive pulmonary disease, unspecified: Secondary | ICD-10-CM | POA: Diagnosis not present

## 2024-02-03 DIAGNOSIS — I129 Hypertensive chronic kidney disease with stage 1 through stage 4 chronic kidney disease, or unspecified chronic kidney disease: Secondary | ICD-10-CM | POA: Diagnosis not present

## 2024-02-03 DIAGNOSIS — C3490 Malignant neoplasm of unspecified part of unspecified bronchus or lung: Secondary | ICD-10-CM | POA: Diagnosis not present

## 2024-02-03 DIAGNOSIS — I4891 Unspecified atrial fibrillation: Secondary | ICD-10-CM | POA: Diagnosis not present

## 2024-02-03 DIAGNOSIS — N1831 Chronic kidney disease, stage 3a: Secondary | ICD-10-CM | POA: Diagnosis not present

## 2024-02-03 DIAGNOSIS — I2510 Atherosclerotic heart disease of native coronary artery without angina pectoris: Secondary | ICD-10-CM | POA: Diagnosis not present

## 2024-02-03 DIAGNOSIS — A419 Sepsis, unspecified organism: Secondary | ICD-10-CM | POA: Diagnosis not present

## 2024-02-03 DIAGNOSIS — N10 Acute tubulo-interstitial nephritis: Secondary | ICD-10-CM | POA: Diagnosis not present

## 2024-02-04 ENCOUNTER — Ambulatory Visit (HOSPITAL_COMMUNITY): Admission: RE | Admit: 2024-02-04 | Source: Ambulatory Visit

## 2024-02-08 DIAGNOSIS — N179 Acute kidney failure, unspecified: Secondary | ICD-10-CM | POA: Diagnosis not present

## 2024-02-08 DIAGNOSIS — J440 Chronic obstructive pulmonary disease with acute lower respiratory infection: Secondary | ICD-10-CM | POA: Diagnosis not present

## 2024-02-08 DIAGNOSIS — I4821 Permanent atrial fibrillation: Secondary | ICD-10-CM | POA: Diagnosis not present

## 2024-02-08 DIAGNOSIS — J9601 Acute respiratory failure with hypoxia: Secondary | ICD-10-CM | POA: Diagnosis not present

## 2024-02-08 DIAGNOSIS — J129 Viral pneumonia, unspecified: Secondary | ICD-10-CM | POA: Diagnosis not present

## 2024-02-08 DIAGNOSIS — N1832 Chronic kidney disease, stage 3b: Secondary | ICD-10-CM | POA: Diagnosis not present

## 2024-02-08 DIAGNOSIS — C3490 Malignant neoplasm of unspecified part of unspecified bronchus or lung: Secondary | ICD-10-CM | POA: Diagnosis not present

## 2024-02-08 DIAGNOSIS — A419 Sepsis, unspecified organism: Secondary | ICD-10-CM | POA: Diagnosis not present

## 2024-02-08 DIAGNOSIS — I129 Hypertensive chronic kidney disease with stage 1 through stage 4 chronic kidney disease, or unspecified chronic kidney disease: Secondary | ICD-10-CM | POA: Diagnosis not present

## 2024-02-11 ENCOUNTER — Inpatient Hospital Stay: Admitting: Internal Medicine

## 2024-02-12 DIAGNOSIS — J129 Viral pneumonia, unspecified: Secondary | ICD-10-CM | POA: Diagnosis not present

## 2024-02-12 DIAGNOSIS — I129 Hypertensive chronic kidney disease with stage 1 through stage 4 chronic kidney disease, or unspecified chronic kidney disease: Secondary | ICD-10-CM | POA: Diagnosis not present

## 2024-02-12 DIAGNOSIS — N179 Acute kidney failure, unspecified: Secondary | ICD-10-CM | POA: Diagnosis not present

## 2024-02-12 DIAGNOSIS — I4821 Permanent atrial fibrillation: Secondary | ICD-10-CM | POA: Diagnosis not present

## 2024-02-12 DIAGNOSIS — J9601 Acute respiratory failure with hypoxia: Secondary | ICD-10-CM | POA: Diagnosis not present

## 2024-02-12 DIAGNOSIS — N1832 Chronic kidney disease, stage 3b: Secondary | ICD-10-CM | POA: Diagnosis not present

## 2024-02-12 DIAGNOSIS — J440 Chronic obstructive pulmonary disease with acute lower respiratory infection: Secondary | ICD-10-CM | POA: Diagnosis not present

## 2024-02-12 DIAGNOSIS — A419 Sepsis, unspecified organism: Secondary | ICD-10-CM | POA: Diagnosis not present

## 2024-02-12 DIAGNOSIS — C3490 Malignant neoplasm of unspecified part of unspecified bronchus or lung: Secondary | ICD-10-CM | POA: Diagnosis not present

## 2024-02-15 ENCOUNTER — Encounter (HOSPITAL_COMMUNITY)
Admission: RE | Admit: 2024-02-15 | Discharge: 2024-02-15 | Disposition: A | Discharge status: Home / Self Care | Source: Ambulatory Visit | Attending: Internal Medicine | Admitting: Internal Medicine

## 2024-02-15 DIAGNOSIS — C3490 Malignant neoplasm of unspecified part of unspecified bronchus or lung: Secondary | ICD-10-CM | POA: Insufficient documentation

## 2024-02-15 DIAGNOSIS — C3411 Malignant neoplasm of upper lobe, right bronchus or lung: Secondary | ICD-10-CM | POA: Diagnosis not present

## 2024-02-15 LAB — GLUCOSE, CAPILLARY: Glucose-Capillary: 95 mg/dL (ref 70–99)

## 2024-02-15 MED ORDER — FLUDEOXYGLUCOSE F - 18 (FDG) INJECTION
6.4100 | Freq: Once | INTRAVENOUS | Status: AC
  Administered 2024-02-15: 6.41 via INTRAVENOUS

## 2024-02-19 DIAGNOSIS — I129 Hypertensive chronic kidney disease with stage 1 through stage 4 chronic kidney disease, or unspecified chronic kidney disease: Secondary | ICD-10-CM | POA: Diagnosis not present

## 2024-02-19 DIAGNOSIS — J129 Viral pneumonia, unspecified: Secondary | ICD-10-CM | POA: Diagnosis not present

## 2024-02-19 DIAGNOSIS — I4821 Permanent atrial fibrillation: Secondary | ICD-10-CM | POA: Diagnosis not present

## 2024-02-19 DIAGNOSIS — N179 Acute kidney failure, unspecified: Secondary | ICD-10-CM | POA: Diagnosis not present

## 2024-02-19 DIAGNOSIS — C3490 Malignant neoplasm of unspecified part of unspecified bronchus or lung: Secondary | ICD-10-CM | POA: Diagnosis not present

## 2024-02-19 DIAGNOSIS — J9601 Acute respiratory failure with hypoxia: Secondary | ICD-10-CM | POA: Diagnosis not present

## 2024-02-19 DIAGNOSIS — A419 Sepsis, unspecified organism: Secondary | ICD-10-CM | POA: Diagnosis not present

## 2024-02-19 DIAGNOSIS — J440 Chronic obstructive pulmonary disease with acute lower respiratory infection: Secondary | ICD-10-CM | POA: Diagnosis not present

## 2024-02-19 DIAGNOSIS — N1832 Chronic kidney disease, stage 3b: Secondary | ICD-10-CM | POA: Diagnosis not present

## 2024-02-25 ENCOUNTER — Telehealth: Payer: Self-pay | Admitting: Internal Medicine

## 2024-02-25 DIAGNOSIS — I4821 Permanent atrial fibrillation: Secondary | ICD-10-CM | POA: Diagnosis not present

## 2024-02-25 DIAGNOSIS — N1832 Chronic kidney disease, stage 3b: Secondary | ICD-10-CM | POA: Diagnosis not present

## 2024-02-25 DIAGNOSIS — J9601 Acute respiratory failure with hypoxia: Secondary | ICD-10-CM | POA: Diagnosis not present

## 2024-02-25 DIAGNOSIS — A419 Sepsis, unspecified organism: Secondary | ICD-10-CM | POA: Diagnosis not present

## 2024-02-25 DIAGNOSIS — J440 Chronic obstructive pulmonary disease with acute lower respiratory infection: Secondary | ICD-10-CM | POA: Diagnosis not present

## 2024-02-25 DIAGNOSIS — N179 Acute kidney failure, unspecified: Secondary | ICD-10-CM | POA: Diagnosis not present

## 2024-02-25 DIAGNOSIS — I129 Hypertensive chronic kidney disease with stage 1 through stage 4 chronic kidney disease, or unspecified chronic kidney disease: Secondary | ICD-10-CM | POA: Diagnosis not present

## 2024-02-25 DIAGNOSIS — J129 Viral pneumonia, unspecified: Secondary | ICD-10-CM | POA: Diagnosis not present

## 2024-02-25 DIAGNOSIS — C3490 Malignant neoplasm of unspecified part of unspecified bronchus or lung: Secondary | ICD-10-CM | POA: Diagnosis not present

## 2024-02-25 NOTE — Telephone Encounter (Signed)
 Rescheduled appointment per provider family emergency. The patient is aware and agreed to see a different MD.

## 2024-02-26 ENCOUNTER — Ambulatory Visit: Attending: Internal Medicine | Admitting: Hematology

## 2024-02-26 ENCOUNTER — Inpatient Hospital Stay: Admitting: Internal Medicine

## 2024-02-26 VITALS — BP 115/74 | HR 84 | Temp 97.9°F | Resp 16 | Ht 66.0 in | Wt 137.0 lb

## 2024-02-26 DIAGNOSIS — N189 Chronic kidney disease, unspecified: Secondary | ICD-10-CM | POA: Insufficient documentation

## 2024-02-26 DIAGNOSIS — D649 Anemia, unspecified: Secondary | ICD-10-CM | POA: Insufficient documentation

## 2024-02-26 DIAGNOSIS — Z87891 Personal history of nicotine dependence: Secondary | ICD-10-CM | POA: Insufficient documentation

## 2024-02-26 DIAGNOSIS — C342 Malignant neoplasm of middle lobe, bronchus or lung: Secondary | ICD-10-CM | POA: Insufficient documentation
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
## 2024-02-27 DIAGNOSIS — I4821 Permanent atrial fibrillation: Secondary | ICD-10-CM | POA: Diagnosis not present

## 2024-02-27 DIAGNOSIS — J440 Chronic obstructive pulmonary disease with acute lower respiratory infection: Secondary | ICD-10-CM | POA: Diagnosis not present

## 2024-02-27 DIAGNOSIS — C3490 Malignant neoplasm of unspecified part of unspecified bronchus or lung: Secondary | ICD-10-CM | POA: Diagnosis not present

## 2024-02-27 DIAGNOSIS — I129 Hypertensive chronic kidney disease with stage 1 through stage 4 chronic kidney disease, or unspecified chronic kidney disease: Secondary | ICD-10-CM | POA: Diagnosis not present

## 2024-02-27 DIAGNOSIS — A419 Sepsis, unspecified organism: Secondary | ICD-10-CM | POA: Diagnosis not present

## 2024-02-27 DIAGNOSIS — N1832 Chronic kidney disease, stage 3b: Secondary | ICD-10-CM | POA: Diagnosis not present

## 2024-02-27 DIAGNOSIS — J9601 Acute respiratory failure with hypoxia: Secondary | ICD-10-CM | POA: Diagnosis not present

## 2024-02-27 DIAGNOSIS — J129 Viral pneumonia, unspecified: Secondary | ICD-10-CM | POA: Diagnosis not present

## 2024-02-27 DIAGNOSIS — N179 Acute kidney failure, unspecified: Secondary | ICD-10-CM | POA: Diagnosis not present

## 2024-03-12 ENCOUNTER — Telehealth: Payer: Self-pay | Admitting: Gastroenterology

## 2024-03-12 NOTE — Telephone Encounter (Signed)
 Left message for pts daughter to call back.  Pt scheduled to see Deanna May NP 03/20/24@1 :30pm. Daughter aware of appt.

## 2024-03-12 NOTE — Telephone Encounter (Signed)
 Inbound call from paient's daughet Anneta, she states patient has been having severe diarrhea and requested an appointment, daughter was offerred appointment for tomorrow at 8:20 with Ellouise Console, PA. She declined but states September was too far out. Daughter is requesting appointment for Friday or Monday and requested message to be sent to a nurse for scheduling.   Message being routed to Pod A due to patient being under Dr. Albertus.

## 2024-03-13 ENCOUNTER — Other Ambulatory Visit: Payer: Self-pay

## 2024-03-13 ENCOUNTER — Other Ambulatory Visit

## 2024-03-13 DIAGNOSIS — R197 Diarrhea, unspecified: Secondary | ICD-10-CM

## 2024-03-13 NOTE — Telephone Encounter (Signed)
 Order in epic, daughter knows to come and pick up the kit.

## 2024-03-13 NOTE — Telephone Encounter (Signed)
 Inbound call from patients daughter, requesting a stool sample kit to test for c diff due to mothers history and current abd cramping and severe diarrhea. Requesting a call back   Please advise  Thank you

## 2024-03-13 NOTE — Telephone Encounter (Signed)
 Pt scheduled for an OV 7/31. Daughter requesting an order be placed for cdiff since she has had this in the past. Please advise.

## 2024-03-17 ENCOUNTER — Inpatient Hospital Stay (HOSPITAL_COMMUNITY)
Admission: EM | Admit: 2024-03-17 | Discharge: 2024-03-28 | DRG: 871 | Disposition: A | Discharge status: Skilled Nursing Facility | Attending: Internal Medicine | Admitting: Internal Medicine

## 2024-03-17 ENCOUNTER — Emergency Department (HOSPITAL_COMMUNITY)

## 2024-03-17 ENCOUNTER — Other Ambulatory Visit: Payer: Self-pay

## 2024-03-17 DIAGNOSIS — D649 Anemia, unspecified: Secondary | ICD-10-CM

## 2024-03-17 DIAGNOSIS — Z85118 Personal history of other malignant neoplasm of bronchus and lung: Secondary | ICD-10-CM

## 2024-03-17 DIAGNOSIS — E876 Hypokalemia: Secondary | ICD-10-CM | POA: Diagnosis present

## 2024-03-17 DIAGNOSIS — R918 Other nonspecific abnormal finding of lung field: Secondary | ICD-10-CM | POA: Diagnosis not present

## 2024-03-17 DIAGNOSIS — A419 Sepsis, unspecified organism: Principal | ICD-10-CM | POA: Diagnosis present

## 2024-03-17 DIAGNOSIS — I959 Hypotension, unspecified: Secondary | ICD-10-CM

## 2024-03-17 DIAGNOSIS — I4891 Unspecified atrial fibrillation: Secondary | ICD-10-CM

## 2024-03-17 DIAGNOSIS — K922 Gastrointestinal hemorrhage, unspecified: Secondary | ICD-10-CM

## 2024-03-17 DIAGNOSIS — R1084 Generalized abdominal pain: Secondary | ICD-10-CM | POA: Diagnosis not present

## 2024-03-17 DIAGNOSIS — M810 Age-related osteoporosis without current pathological fracture: Secondary | ICD-10-CM | POA: Diagnosis present

## 2024-03-17 DIAGNOSIS — R4182 Altered mental status, unspecified: Secondary | ICD-10-CM | POA: Diagnosis present

## 2024-03-17 DIAGNOSIS — E878 Other disorders of electrolyte and fluid balance, not elsewhere classified: Secondary | ICD-10-CM | POA: Diagnosis not present

## 2024-03-17 DIAGNOSIS — R578 Other shock: Secondary | ICD-10-CM | POA: Diagnosis not present

## 2024-03-17 DIAGNOSIS — N136 Pyonephrosis: Secondary | ICD-10-CM | POA: Diagnosis present

## 2024-03-17 DIAGNOSIS — D638 Anemia in other chronic diseases classified elsewhere: Secondary | ICD-10-CM | POA: Diagnosis not present

## 2024-03-17 DIAGNOSIS — E039 Hypothyroidism, unspecified: Secondary | ICD-10-CM | POA: Diagnosis present

## 2024-03-17 DIAGNOSIS — Z823 Family history of stroke: Secondary | ICD-10-CM

## 2024-03-17 DIAGNOSIS — Z9049 Acquired absence of other specified parts of digestive tract: Secondary | ICD-10-CM

## 2024-03-17 DIAGNOSIS — N3001 Acute cystitis with hematuria: Secondary | ICD-10-CM | POA: Diagnosis not present

## 2024-03-17 DIAGNOSIS — K219 Gastro-esophageal reflux disease without esophagitis: Secondary | ICD-10-CM | POA: Diagnosis present

## 2024-03-17 DIAGNOSIS — K529 Noninfective gastroenteritis and colitis, unspecified: Secondary | ICD-10-CM | POA: Diagnosis present

## 2024-03-17 DIAGNOSIS — D62 Acute posthemorrhagic anemia: Secondary | ICD-10-CM | POA: Diagnosis not present

## 2024-03-17 DIAGNOSIS — W19XXXA Unspecified fall, initial encounter: Secondary | ICD-10-CM | POA: Diagnosis not present

## 2024-03-17 DIAGNOSIS — N3000 Acute cystitis without hematuria: Secondary | ICD-10-CM | POA: Diagnosis present

## 2024-03-17 DIAGNOSIS — Z96641 Presence of right artificial hip joint: Secondary | ICD-10-CM | POA: Diagnosis present

## 2024-03-17 DIAGNOSIS — R509 Fever, unspecified: Secondary | ICD-10-CM | POA: Diagnosis not present

## 2024-03-17 DIAGNOSIS — D631 Anemia in chronic kidney disease: Secondary | ICD-10-CM | POA: Diagnosis present

## 2024-03-17 DIAGNOSIS — G9341 Metabolic encephalopathy: Secondary | ICD-10-CM | POA: Diagnosis present

## 2024-03-17 DIAGNOSIS — Z79890 Hormone replacement therapy (postmenopausal): Secondary | ICD-10-CM

## 2024-03-17 DIAGNOSIS — I129 Hypertensive chronic kidney disease with stage 1 through stage 4 chronic kidney disease, or unspecified chronic kidney disease: Secondary | ICD-10-CM | POA: Diagnosis present

## 2024-03-17 DIAGNOSIS — G4700 Insomnia, unspecified: Secondary | ICD-10-CM | POA: Diagnosis present

## 2024-03-17 DIAGNOSIS — N179 Acute kidney failure, unspecified: Secondary | ICD-10-CM | POA: Diagnosis present

## 2024-03-17 DIAGNOSIS — E869 Volume depletion, unspecified: Secondary | ICD-10-CM | POA: Diagnosis present

## 2024-03-17 DIAGNOSIS — E871 Hypo-osmolality and hyponatremia: Secondary | ICD-10-CM | POA: Diagnosis present

## 2024-03-17 DIAGNOSIS — K449 Diaphragmatic hernia without obstruction or gangrene: Secondary | ICD-10-CM | POA: Diagnosis present

## 2024-03-17 DIAGNOSIS — J984 Other disorders of lung: Secondary | ICD-10-CM | POA: Diagnosis not present

## 2024-03-17 DIAGNOSIS — R6520 Severe sepsis without septic shock: Secondary | ICD-10-CM | POA: Diagnosis present

## 2024-03-17 DIAGNOSIS — R109 Unspecified abdominal pain: Secondary | ICD-10-CM | POA: Diagnosis not present

## 2024-03-17 DIAGNOSIS — E8721 Acute metabolic acidosis: Secondary | ICD-10-CM

## 2024-03-17 DIAGNOSIS — R319 Hematuria, unspecified: Secondary | ICD-10-CM

## 2024-03-17 DIAGNOSIS — R579 Shock, unspecified: Principal | ICD-10-CM | POA: Diagnosis present

## 2024-03-17 DIAGNOSIS — Z905 Acquired absence of kidney: Secondary | ICD-10-CM

## 2024-03-17 DIAGNOSIS — R296 Repeated falls: Secondary | ICD-10-CM | POA: Diagnosis present

## 2024-03-17 DIAGNOSIS — Z880 Allergy status to penicillin: Secondary | ICD-10-CM

## 2024-03-17 DIAGNOSIS — R410 Disorientation, unspecified: Secondary | ICD-10-CM | POA: Diagnosis not present

## 2024-03-17 DIAGNOSIS — Z923 Personal history of irradiation: Secondary | ICD-10-CM

## 2024-03-17 DIAGNOSIS — Z818 Family history of other mental and behavioral disorders: Secondary | ICD-10-CM

## 2024-03-17 DIAGNOSIS — N1831 Chronic kidney disease, stage 3a: Secondary | ICD-10-CM | POA: Diagnosis present

## 2024-03-17 DIAGNOSIS — I4819 Other persistent atrial fibrillation: Secondary | ICD-10-CM | POA: Diagnosis present

## 2024-03-17 DIAGNOSIS — Z8249 Family history of ischemic heart disease and other diseases of the circulatory system: Secondary | ICD-10-CM

## 2024-03-17 DIAGNOSIS — Z1152 Encounter for screening for COVID-19: Secondary | ICD-10-CM

## 2024-03-17 DIAGNOSIS — R197 Diarrhea, unspecified: Secondary | ICD-10-CM | POA: Diagnosis not present

## 2024-03-17 DIAGNOSIS — E7800 Pure hypercholesterolemia, unspecified: Secondary | ICD-10-CM | POA: Diagnosis present

## 2024-03-17 DIAGNOSIS — Z79899 Other long term (current) drug therapy: Secondary | ICD-10-CM

## 2024-03-17 DIAGNOSIS — Z8673 Personal history of transient ischemic attack (TIA), and cerebral infarction without residual deficits: Secondary | ICD-10-CM

## 2024-03-17 DIAGNOSIS — Z90710 Acquired absence of both cervix and uterus: Secondary | ICD-10-CM

## 2024-03-17 DIAGNOSIS — E872 Acidosis, unspecified: Secondary | ICD-10-CM | POA: Diagnosis present

## 2024-03-17 DIAGNOSIS — Z7401 Bed confinement status: Secondary | ICD-10-CM | POA: Diagnosis not present

## 2024-03-17 DIAGNOSIS — J449 Chronic obstructive pulmonary disease, unspecified: Secondary | ICD-10-CM | POA: Diagnosis present

## 2024-03-17 DIAGNOSIS — R531 Weakness: Secondary | ICD-10-CM | POA: Diagnosis not present

## 2024-03-17 DIAGNOSIS — Z981 Arthrodesis status: Secondary | ICD-10-CM

## 2024-03-17 DIAGNOSIS — Z7901 Long term (current) use of anticoagulants: Secondary | ICD-10-CM | POA: Diagnosis not present

## 2024-03-17 DIAGNOSIS — G9340 Encephalopathy, unspecified: Secondary | ICD-10-CM | POA: Diagnosis present

## 2024-03-17 DIAGNOSIS — Z87891 Personal history of nicotine dependence: Secondary | ICD-10-CM

## 2024-03-17 DIAGNOSIS — Z8619 Personal history of other infectious and parasitic diseases: Secondary | ICD-10-CM | POA: Diagnosis not present

## 2024-03-17 LAB — CK: Total CK: 67 U/L (ref 38–234)

## 2024-03-17 LAB — CBC WITH DIFFERENTIAL/PLATELET
Abs Immature Granulocytes: 0.06 K/uL (ref 0.00–0.07)
Basophils Absolute: 0 K/uL (ref 0.0–0.1)
Basophils Relative: 0 %
Eosinophils Absolute: 0 K/uL (ref 0.0–0.5)
Eosinophils Relative: 0 %
HCT: 23.2 % — ABNORMAL LOW (ref 36.0–46.0)
Hemoglobin: 6.8 g/dL — CL (ref 12.0–15.0)
Immature Granulocytes: 1 %
Lymphocytes Relative: 5 %
Lymphs Abs: 0.5 K/uL — ABNORMAL LOW (ref 0.7–4.0)
MCH: 27 pg (ref 26.0–34.0)
MCHC: 29.3 g/dL — ABNORMAL LOW (ref 30.0–36.0)
MCV: 92.1 fL (ref 80.0–100.0)
Monocytes Absolute: 0.9 K/uL (ref 0.1–1.0)
Monocytes Relative: 9 %
Neutro Abs: 8.8 K/uL — ABNORMAL HIGH (ref 1.7–7.7)
Neutrophils Relative %: 85 %
Platelets: 158 K/uL (ref 150–400)
RBC: 2.52 MIL/uL — ABNORMAL LOW (ref 3.87–5.11)
RDW: 13.8 % (ref 11.5–15.5)
WBC: 10.3 K/uL (ref 4.0–10.5)
nRBC: 0 % (ref 0.0–0.2)

## 2024-03-17 LAB — RESP PANEL BY RT-PCR (RSV, FLU A&B, COVID)  RVPGX2
Influenza A by PCR: NEGATIVE
Influenza B by PCR: NEGATIVE
Resp Syncytial Virus by PCR: NEGATIVE
SARS Coronavirus 2 by RT PCR: NEGATIVE

## 2024-03-17 LAB — COMPREHENSIVE METABOLIC PANEL WITH GFR
ALT: 8 U/L (ref 0–44)
AST: 14 U/L — ABNORMAL LOW (ref 15–41)
Albumin: 2.5 g/dL — ABNORMAL LOW (ref 3.5–5.0)
Alkaline Phosphatase: 48 U/L (ref 38–126)
Anion gap: 9 (ref 5–15)
BUN: 37 mg/dL — ABNORMAL HIGH (ref 8–23)
CO2: 15 mmol/L — ABNORMAL LOW (ref 22–32)
Calcium: 7.6 mg/dL — ABNORMAL LOW (ref 8.9–10.3)
Chloride: 108 mmol/L (ref 98–111)
Creatinine, Ser: 2.59 mg/dL — ABNORMAL HIGH (ref 0.44–1.00)
GFR, Estimated: 18 mL/min — ABNORMAL LOW
Glucose, Bld: 105 mg/dL — ABNORMAL HIGH (ref 70–99)
Potassium: 2.9 mmol/L — ABNORMAL LOW (ref 3.5–5.1)
Sodium: 132 mmol/L — ABNORMAL LOW (ref 135–145)
Total Bilirubin: 0.9 mg/dL (ref 0.0–1.2)
Total Protein: 5 g/dL — ABNORMAL LOW (ref 6.5–8.1)

## 2024-03-17 LAB — MAGNESIUM: Magnesium: 1.5 mg/dL — ABNORMAL LOW (ref 1.7–2.4)

## 2024-03-17 LAB — POC OCCULT BLOOD, ED: Fecal Occult Bld: NEGATIVE

## 2024-03-17 LAB — PREPARE RBC (CROSSMATCH)

## 2024-03-17 LAB — I-STAT CG4 LACTIC ACID, ED: Lactic Acid, Venous: 1.9 mmol/L (ref 0.5–1.9)

## 2024-03-17 MED ORDER — NOREPINEPHRINE 4 MG/250ML-% IV SOLN
0.0000 ug/min | INTRAVENOUS | Status: DC
  Administered 2024-03-17: 2 ug/min via INTRAVENOUS
  Filled 2024-03-17: qty 250

## 2024-03-17 MED ORDER — LACTATED RINGERS IV BOLUS (SEPSIS): 1000.0000 mL | Freq: Once | INTRAVENOUS | Status: DC

## 2024-03-17 MED ORDER — POTASSIUM CHLORIDE 10 MEQ/100ML IV SOLN
10.0000 meq | INTRAVENOUS | Status: AC
  Administered 2024-03-17 – 2024-03-18 (×2): 10 meq via INTRAVENOUS
  Filled 2024-03-17 (×2): qty 100

## 2024-03-17 MED ORDER — LACTATED RINGERS IV BOLUS (SEPSIS)
2000.0000 mL | Freq: Once | INTRAVENOUS | Status: AC
  Administered 2024-03-17: 200 mL via INTRAVENOUS

## 2024-03-17 MED ORDER — VANCOMYCIN HCL IN DEXTROSE 1-5 GM/200ML-% IV SOLN
1000.0000 mg | Freq: Once | INTRAVENOUS | Status: AC
  Administered 2024-03-18: 1000 mg via INTRAVENOUS
  Filled 2024-03-17: qty 200

## 2024-03-17 MED ORDER — METRONIDAZOLE 500 MG/100ML IV SOLN
500.0000 mg | Freq: Once | INTRAVENOUS | Status: AC
  Administered 2024-03-17: 500 mg via INTRAVENOUS
  Filled 2024-03-17: qty 100

## 2024-03-17 MED ORDER — CALCIUM GLUCONATE-NACL 1-0.675 GM/50ML-% IV SOLN
1.0000 g | Freq: Once | INTRAVENOUS | Status: AC
  Administered 2024-03-17: 1000 mg via INTRAVENOUS
  Filled 2024-03-17: qty 50

## 2024-03-17 MED ORDER — POTASSIUM CHLORIDE CRYS ER 20 MEQ PO TBCR
40.0000 meq | EXTENDED_RELEASE_TABLET | ORAL | Status: AC
  Administered 2024-03-17 – 2024-03-18 (×2): 40 meq via ORAL
  Filled 2024-03-17 (×2): qty 2

## 2024-03-17 MED ORDER — MAGNESIUM SULFATE 50 % IJ SOLN: 1.0000 g | Freq: Once | INTRAMUSCULAR | Status: DC

## 2024-03-17 MED ORDER — SODIUM CHLORIDE 0.9% IV SOLUTION: Freq: Once | INTRAVENOUS | Status: AC

## 2024-03-17 MED ORDER — PROTHROMBIN COMPLEX CONC HUMAN 500 UNITS IV KIT
1611.0000 [IU] | PACK | Status: AC
  Administered 2024-03-18: 1611 [IU] via INTRAVENOUS
  Filled 2024-03-17: qty 535

## 2024-03-17 MED ORDER — SODIUM CHLORIDE 0.9 % IV SOLN: 250.0000 mL | INTRAVENOUS | Status: AC

## 2024-03-17 MED ORDER — LACTATED RINGERS IV BOLUS (SEPSIS)
1000.0000 mL | Freq: Once | INTRAVENOUS | Status: DC
  Administered 2024-03-17: 1000 mL via INTRAVENOUS

## 2024-03-17 MED ORDER — SODIUM CHLORIDE 0.9 % IV SOLN
2.0000 g | Freq: Once | INTRAVENOUS | Status: AC
  Administered 2024-03-17: 2 g via INTRAVENOUS
  Filled 2024-03-17: qty 12.5

## 2024-03-17 NOTE — Sepsis Progress Note (Signed)
 Elink following for sepsis protocol.

## 2024-03-17 NOTE — ED Notes (Signed)
 One blue top culture bottle sent to lab to hold

## 2024-03-17 NOTE — Progress Notes (Signed)
 ED Pharmacy Antibiotic Sign Off An antibiotic consult was received from an ED provider for Vancomycin  per pharmacy dosing for sepsis. A chart review was completed to assess appropriateness.   The following one time order(s) were placed:  Vancomycin  1gm IV q24h   Further antibiotic and/or antibiotic pharmacy consults should be ordered by the admitting provider if indicated.   Thank you for allowing pharmacy to be a part of this patient's care.   Rosaline Millet, [facility]  Clinical Pharmacist 03/17/24 11:24 PM

## 2024-03-17 NOTE — ED Notes (Signed)
 Patient transported to CT

## 2024-03-17 NOTE — ED Notes (Addendum)
 [name]
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
## 2024-03-17 NOTE — ED Triage Notes (Signed)
 BIBA from home increasing weakness, multiple falls, no obvious injuries, increased confusion per family. Pt only complaint is abdominal pain. 650 tylenol , 500 cc given PTA 80 HR- Afib 150/100 BP 141 cbg

## 2024-03-17 NOTE — ED Provider Notes (Signed)
 [place] EMERGENCY DEPARTMENT AT [facility] Provider Note   CSN: [id number] Arrival date & time: 03/17/24  2144     Patient presents with: Abdominal Pain, Altered Mental Status, and Fever   [name] is a 78 y.o. female.  {Add pertinent medical, surgical, social history, OB history to HPI:4540} 78 year old female with a history of lung cancer, recurrent C. difficile colitis, atrial fibrillation on Eliquis , stroke, and CKD who presents emergency department with abdominal pain and fever.  History obtained per patient and her daughter.  They report that she just darted having some diarrhea and nausea on Tuesday.  The need for 3 days and then on Sunday was feeling better.  Today had a second decline and has been feeling very weak.  Did slide down to the floor today onto her bottom and was on the ground for approximately an hour so they called 911.  No head strike or LOC.  Did have a fever earlier today as well.       Prior to Admission medications   Medication Sig Start Date End Date Taking? Authorizing Provider  acetaminophen  (TYLENOL ) 325 MG tablet Take 2 tablets (650 mg total) by mouth every 8 (eight) hours. Patient taking differently: Take 650 mg by mouth 2 (two) times daily as needed for mild pain (pain score 1-3) or headache. 02/18/19   Love, Sharlet RAMAN, PA-C  albuterol  (VENTOLIN  HFA) 108 (90 Base) MCG/ACT inhaler Inhale 2 puffs into the lungs every 6 (six) hours as needed for wheezing or shortness of breath. 02/01/24   Pokhrel, Vernal, MD  apixaban  (ELIQUIS ) 5 MG TABS tablet Take 1 tablet (5 mg total) by mouth 2 (two) times daily. 09/14/23   [place], Peter M, MD  ascorbic acid (VITAMIN C) 500 MG tablet Take 500 mg by mouth daily.    [provider]  atorvastatin  (LIPITOR) 10 MG tablet Take 1 tablet (10 mg total) by mouth daily. Patient taking differently: Take 10 mg by mouth daily in the afternoon. 02/18/19   Love, Sharlet RAMAN, PA-C  bisoprolol  (ZEBETA ) 5 MG tablet  Take 2.5 mg by mouth daily. 03/11/23   [provider]  buPROPion  (WELLBUTRIN ) 75 MG tablet Take 75 mg by mouth every morning. 12/31/20   [provider]  chlorpheniramine-HYDROcodone  (TUSSIONEX) 10-8 MG/5ML Take 5 mLs by mouth every 12 (twelve) hours. 02/01/24   Pokhrel, Vernal, MD  Cholecalciferol  (VITAMIN D3) 1000 units CAPS Take 1,000 Units by mouth daily.    [provider]  ferrous sulfate  325 (65 FE) MG tablet Take 1 tablet (325 mg total) by mouth daily with breakfast. 01/09/24 04/08/24  Dahal, Chapman, MD  FLUoxetine  (PROZAC ) 10 MG capsule Take 20 mg by mouth at bedtime.    [provider]  FLUoxetine  (PROZAC ) 20 MG capsule Take 1 capsule (20 mg total) by mouth at bedtime. 02/18/19   Love, Sharlet RAMAN, PA-C  gabapentin  (NEURONTIN ) 600 MG tablet Take 600 mg by mouth See admin instructions. Take 600 mg by mouth in the morning & at bedtime and an additional 600 mg once a day as needed for pain    [provider]  guaiFENesin -dextromethorphan  (ROBITUSSIN DM) 100-10 MG/5ML syrup Take 5 mLs by mouth every 4 (four) hours while awake. 02/01/24   Pokhrel, Laxman, MD  levothyroxine  (SYNTHROID ) 88 MCG tablet Take 1 tablet (88 mcg total) by mouth daily before breakfast. 03/13/19   [place], Peter M, MD  loratadine  (CLARITIN ) 10 MG tablet Take 10 mg by mouth daily as needed  for allergies or rhinitis.    [provider]  omeprazole  (PRILOSEC ) 20 MG capsule Take 1 capsule (20 mg total) by mouth 2 (two) times daily before a meal. 07/09/23   Zehr, Harlene D, PA-C  ondansetron  (ZOFRAN -ODT) 4 MG disintegrating tablet Take 1 tablet (4 mg total) by mouth every 8 (eight) hours as needed for nausea or vomiting. Patient taking differently: Take 4 mg by mouth every 8 (eight) hours as needed for nausea or vomiting (DISSOLVE ORALLY). 05/26/23   Sheikh, Omair Latif, DO  saccharomyces boulardii (FLORASTOR) 250 MG capsule Take 1 capsule (250 mg total) by mouth 2 (two) times daily.  12/10/23   May, Deanna J, NP    Allergies: Penicillins    Review of Systems  Updated Vital Signs BP (!) 93/58 (BP Location: Left Arm)   Pulse 90   Temp (!) 103.1 F (39.5 C) (Oral)   Resp 12   SpO2 100%   Physical Exam Vitals and nursing note reviewed.  Constitutional:      General: She is not in acute distress.    Appearance: She is well-developed.  HENT:     Head: Normocephalic and atraumatic.     Right Ear: External ear normal.     Left Ear: External ear normal.     Nose: Nose normal.  Eyes:     Extraocular Movements: Extraocular movements intact.     Conjunctiva/sclera: Conjunctivae normal.     Pupils: Pupils are equal, round, and reactive to light.  Neck:     Comments: No C-spine midline tenderness to palpation Cardiovascular:     Rate and Rhythm: Normal rate. Rhythm irregular.     Heart sounds: No murmur heard. Pulmonary:     Effort: Pulmonary effort is normal. No respiratory distress.     Breath sounds: Normal breath sounds.  Abdominal:     General: Abdomen is flat. There is no distension.     Palpations: Abdomen is soft. There is no mass.     Tenderness: There is abdominal tenderness (Lower abdomen bilaterally). There is no guarding.  Musculoskeletal:     Cervical back: Normal range of motion and neck supple.     Right lower leg: No edema.     Left lower leg: No edema.  Skin:    General: Skin is warm and dry.  Neurological:     Mental Status: She is alert and oriented to person, place, and time. Mental status is at baseline.  Psychiatric:        Mood and Affect: Mood normal.     (all labs ordered are listed, but only abnormal results are displayed) Labs Reviewed  COMPREHENSIVE METABOLIC PANEL WITH GFR  CBC WITH DIFFERENTIAL/PLATELET  URINALYSIS, W/ REFLEX TO CULTURE (INFECTION SUSPECTED)  I-STAT CG4 LACTIC ACID, ED    EKG: None  Radiology: No results found.  {Document cardiac monitor, telemetry assessment procedure when  appropriate:32947} Procedures   Medications Ordered in the ED - No data to display  Clinical Course as of 03/17/24 2219  Mon Mar 17, 2024  2218 Hemoglobin(!!): 6.8 Baseline of 10.9 [RP]    Clinical Course User Index [RP] Yolande Lamar BROCKS, MD   {Click here for ABCD2, HEART and other calculators REFRESH Note before signing:1}                              Medical Decision Making Amount and/or Complexity of Data Reviewed Labs: ordered. Decision-making details documented in ED  Course. Radiology: ordered.  Risk Prescription drug management.   ***  {Document critical care time when appropriate  Document review of labs and clinical decision tools ie CHADS2VASC2, etc  Document your independent review of radiology images and any outside records  Document your discussion with family members, caretakers and with consultants  Document social determinants of health affecting pt's care  Document your decision making why or why not admission, treatments were needed:32947:::1}   Final diagnoses:  None    ED Discharge Orders     None

## 2024-03-18 ENCOUNTER — Inpatient Hospital Stay (HOSPITAL_COMMUNITY)

## 2024-03-18 ENCOUNTER — Encounter (HOSPITAL_COMMUNITY): Payer: Self-pay | Admitting: Critical Care Medicine

## 2024-03-18 DIAGNOSIS — J449 Chronic obstructive pulmonary disease, unspecified: Secondary | ICD-10-CM | POA: Diagnosis present

## 2024-03-18 DIAGNOSIS — E039 Hypothyroidism, unspecified: Secondary | ICD-10-CM | POA: Diagnosis present

## 2024-03-18 DIAGNOSIS — N130 Hydronephrosis with ureteropelvic junction obstruction: Secondary | ICD-10-CM | POA: Diagnosis not present

## 2024-03-18 DIAGNOSIS — R197 Diarrhea, unspecified: Secondary | ICD-10-CM | POA: Diagnosis not present

## 2024-03-18 DIAGNOSIS — R579 Shock, unspecified: Secondary | ICD-10-CM | POA: Diagnosis not present

## 2024-03-18 DIAGNOSIS — G4700 Insomnia, unspecified: Secondary | ICD-10-CM | POA: Diagnosis present

## 2024-03-18 DIAGNOSIS — I959 Hypotension, unspecified: Secondary | ICD-10-CM | POA: Diagnosis not present

## 2024-03-18 DIAGNOSIS — R578 Other shock: Secondary | ICD-10-CM

## 2024-03-18 DIAGNOSIS — E7800 Pure hypercholesterolemia, unspecified: Secondary | ICD-10-CM | POA: Diagnosis present

## 2024-03-18 DIAGNOSIS — E878 Other disorders of electrolyte and fluid balance, not elsewhere classified: Secondary | ICD-10-CM | POA: Diagnosis not present

## 2024-03-18 DIAGNOSIS — N3001 Acute cystitis with hematuria: Secondary | ICD-10-CM | POA: Diagnosis not present

## 2024-03-18 DIAGNOSIS — D649 Anemia, unspecified: Secondary | ICD-10-CM

## 2024-03-18 DIAGNOSIS — Z905 Acquired absence of kidney: Secondary | ICD-10-CM | POA: Diagnosis not present

## 2024-03-18 DIAGNOSIS — N179 Acute kidney failure, unspecified: Secondary | ICD-10-CM | POA: Diagnosis present

## 2024-03-18 DIAGNOSIS — R2681 Unsteadiness on feet: Secondary | ICD-10-CM | POA: Diagnosis not present

## 2024-03-18 DIAGNOSIS — Z7901 Long term (current) use of anticoagulants: Secondary | ICD-10-CM | POA: Diagnosis not present

## 2024-03-18 DIAGNOSIS — D631 Anemia in chronic kidney disease: Secondary | ICD-10-CM | POA: Diagnosis present

## 2024-03-18 DIAGNOSIS — M810 Age-related osteoporosis without current pathological fracture: Secondary | ICD-10-CM | POA: Diagnosis present

## 2024-03-18 DIAGNOSIS — R319 Hematuria, unspecified: Secondary | ICD-10-CM | POA: Diagnosis not present

## 2024-03-18 DIAGNOSIS — R310 Gross hematuria: Secondary | ICD-10-CM | POA: Diagnosis not present

## 2024-03-18 DIAGNOSIS — E871 Hypo-osmolality and hyponatremia: Secondary | ICD-10-CM | POA: Diagnosis present

## 2024-03-18 DIAGNOSIS — A419 Sepsis, unspecified organism: Secondary | ICD-10-CM | POA: Diagnosis present

## 2024-03-18 DIAGNOSIS — R6520 Severe sepsis without septic shock: Secondary | ICD-10-CM | POA: Diagnosis present

## 2024-03-18 DIAGNOSIS — Z1152 Encounter for screening for COVID-19: Secondary | ICD-10-CM | POA: Diagnosis not present

## 2024-03-18 DIAGNOSIS — I4819 Other persistent atrial fibrillation: Secondary | ICD-10-CM | POA: Diagnosis present

## 2024-03-18 DIAGNOSIS — I129 Hypertensive chronic kidney disease with stage 1 through stage 4 chronic kidney disease, or unspecified chronic kidney disease: Secondary | ICD-10-CM | POA: Diagnosis present

## 2024-03-18 DIAGNOSIS — G9341 Metabolic encephalopathy: Secondary | ICD-10-CM | POA: Diagnosis present

## 2024-03-18 DIAGNOSIS — N1831 Chronic kidney disease, stage 3a: Secondary | ICD-10-CM | POA: Diagnosis present

## 2024-03-18 DIAGNOSIS — E872 Acidosis, unspecified: Secondary | ICD-10-CM | POA: Diagnosis present

## 2024-03-18 DIAGNOSIS — K529 Noninfective gastroenteritis and colitis, unspecified: Secondary | ICD-10-CM | POA: Diagnosis present

## 2024-03-18 DIAGNOSIS — K449 Diaphragmatic hernia without obstruction or gangrene: Secondary | ICD-10-CM | POA: Diagnosis present

## 2024-03-18 DIAGNOSIS — N136 Pyonephrosis: Secondary | ICD-10-CM | POA: Diagnosis present

## 2024-03-18 DIAGNOSIS — E869 Volume depletion, unspecified: Secondary | ICD-10-CM | POA: Diagnosis present

## 2024-03-18 DIAGNOSIS — E876 Hypokalemia: Secondary | ICD-10-CM | POA: Diagnosis present

## 2024-03-18 DIAGNOSIS — Z8619 Personal history of other infectious and parasitic diseases: Secondary | ICD-10-CM | POA: Diagnosis not present

## 2024-03-18 DIAGNOSIS — N1330 Unspecified hydronephrosis: Secondary | ICD-10-CM | POA: Diagnosis not present

## 2024-03-18 DIAGNOSIS — R4182 Altered mental status, unspecified: Secondary | ICD-10-CM

## 2024-03-18 DIAGNOSIS — D62 Acute posthemorrhagic anemia: Secondary | ICD-10-CM | POA: Diagnosis not present

## 2024-03-18 DIAGNOSIS — M6281 Muscle weakness (generalized): Secondary | ICD-10-CM | POA: Diagnosis not present

## 2024-03-18 DIAGNOSIS — I4821 Permanent atrial fibrillation: Secondary | ICD-10-CM | POA: Diagnosis not present

## 2024-03-18 DIAGNOSIS — R160 Hepatomegaly, not elsewhere classified: Secondary | ICD-10-CM | POA: Diagnosis not present

## 2024-03-18 LAB — GLUCOSE, CAPILLARY
Glucose-Capillary: 105 mg/dL — ABNORMAL HIGH (ref 70–99)
Glucose-Capillary: 106 mg/dL — ABNORMAL HIGH (ref 70–99)
Glucose-Capillary: 111 mg/dL — ABNORMAL HIGH (ref 70–99)
Glucose-Capillary: 116 mg/dL — ABNORMAL HIGH (ref 70–99)
Glucose-Capillary: 122 mg/dL — ABNORMAL HIGH (ref 70–99)
Glucose-Capillary: 96 mg/dL (ref 70–99)

## 2024-03-18 LAB — ECHOCARDIOGRAM COMPLETE
Area-P 1/2: 4.15 cm2
Calc EF: 73.5 %
Height: 66 in
MV VTI: 2.74 cm2
S' Lateral: 2.5 cm
Single Plane A2C EF: 73.9 %
Single Plane A4C EF: 70.6 %
Weight: 2324.53 [oz_av]

## 2024-03-18 LAB — URINALYSIS, W/ REFLEX TO CULTURE (INFECTION SUSPECTED)
Bilirubin Urine: NEGATIVE
Glucose, UA: NEGATIVE mg/dL
Ketones, ur: NEGATIVE mg/dL
Nitrite: NEGATIVE
Protein, ur: 100 mg/dL — AB
RBC / HPF: >50 RBC/hpf (ref 0–5)
Specific Gravity, Urine: 1.011 (ref 1.005–1.030)
WBC, UA: >50 WBC/hpf (ref 0–5)
pH: 5 (ref 5.0–8.0)

## 2024-03-18 LAB — GASTROINTESTINAL PANEL BY PCR, STOOL (REPLACES STOOL CULTURE)

## 2024-03-18 LAB — CBC WITH DIFFERENTIAL/PLATELET
Abs Immature Granulocytes: 0.06 K/uL (ref 0.00–0.07)
Basophils Absolute: 0 K/uL (ref 0.0–0.1)
Basophils Relative: 0 %
Eosinophils Absolute: 0 K/uL (ref 0.0–0.5)
Eosinophils Relative: 0 %
HCT: 38.3 % (ref 36.0–46.0)
Hemoglobin: 12.2 g/dL (ref 12.0–15.0)
Immature Granulocytes: 1 %
Lymphocytes Relative: 5 %
Lymphs Abs: 0.6 K/uL — ABNORMAL LOW (ref 0.7–4.0)
MCH: 27.8 pg (ref 26.0–34.0)
MCHC: 31.9 g/dL (ref 30.0–36.0)
MCV: 87.2 fL (ref 80.0–100.0)
Monocytes Absolute: 0.8 K/uL (ref 0.1–1.0)
Monocytes Relative: 6 %
Neutro Abs: 10.3 K/uL — ABNORMAL HIGH (ref 1.7–7.7)
Neutrophils Relative %: 88 %
Platelets: 222 K/uL (ref 150–400)
RBC: 4.39 MIL/uL (ref 3.87–5.11)
RDW: 13.8 % (ref 11.5–15.5)
WBC: 11.7 K/uL — ABNORMAL HIGH (ref 4.0–10.5)
nRBC: 0 % (ref 0.0–0.2)

## 2024-03-18 LAB — BASIC METABOLIC PANEL WITH GFR
Anion gap: 5 (ref 5–15)
BUN: 34 mg/dL — ABNORMAL HIGH (ref 8–23)
CO2: 20 mmol/L — ABNORMAL LOW (ref 22–32)
Calcium: 9.4 mg/dL (ref 8.9–10.3)
Chloride: 109 mmol/L (ref 98–111)
Creatinine, Ser: 2.24 mg/dL — ABNORMAL HIGH (ref 0.44–1.00)
GFR, Estimated: 22 mL/min — ABNORMAL LOW (ref >60)
Glucose, Bld: 115 mg/dL — ABNORMAL HIGH (ref 70–99)
Potassium: 4.4 mmol/L (ref 3.5–5.1)
Sodium: 134 mmol/L — ABNORMAL LOW (ref 135–145)

## 2024-03-18 LAB — C DIFFICILE QUICK SCREEN W PCR REFLEX
C Diff antigen: POSITIVE — AB
C Diff toxin: NEGATIVE

## 2024-03-18 LAB — MAGNESIUM: Magnesium: 3.5 mg/dL — ABNORMAL HIGH (ref 1.7–2.4)

## 2024-03-18 LAB — CORTISOL: Cortisol, Plasma: 17.7 ug/dL

## 2024-03-18 LAB — HEMOGLOBIN A1C
Hgb A1c MFr Bld: 4.8 % (ref 4.8–5.6)
Mean Plasma Glucose: 91.06 mg/dL

## 2024-03-18 LAB — CLOSTRIDIUM DIFFICILE BY PCR, REFLEXED: Toxigenic C. Difficile by PCR: NEGATIVE

## 2024-03-18 LAB — HEMOGLOBIN AND HEMATOCRIT, BLOOD
HCT: 39.7 % (ref 36.0–46.0)
Hemoglobin: 12.4 g/dL (ref 12.0–15.0)

## 2024-03-18 LAB — MRSA NEXT GEN BY PCR, NASAL: MRSA by PCR Next Gen: NOT DETECTED

## 2024-03-18 MED ORDER — PANTOPRAZOLE SODIUM 40 MG IV SOLR
40.0000 mg | Freq: Two times a day (BID) | INTRAVENOUS | Status: DC
  Administered 2024-03-18 – 2024-03-20 (×6): 40 mg via INTRAVENOUS
  Filled 2024-03-18 (×6): qty 10

## 2024-03-18 MED ORDER — SODIUM BICARBONATE 8.4 % IV SOLN
150.0000 mg | Freq: Once | INTRAVENOUS | Status: AC
  Administered 2024-03-18: 1.8 meq via INTRAVENOUS
  Filled 2024-03-18: qty 50

## 2024-03-18 MED ORDER — CHLORHEXIDINE GLUCONATE CLOTH 2 % EX PADS
6.0000 | MEDICATED_PAD | Freq: Every day | CUTANEOUS | Status: DC
  Administered 2024-03-18 – 2024-03-21 (×4): 6 via TOPICAL

## 2024-03-18 MED ORDER — MAGNESIUM SULFATE 4 GM/100ML IV SOLN
4.0000 g | Freq: Once | INTRAVENOUS | Status: AC
Start: 2024-03-18 — End: 2024-03-18
  Administered 2024-03-18: 4 g via INTRAVENOUS
  Filled 2024-03-18: qty 100

## 2024-03-18 MED ORDER — ACETAMINOPHEN 10 MG/ML IV SOLN
1000.0000 mg | Freq: Once | INTRAVENOUS | Status: AC
  Administered 2024-03-18: 1000 mg via INTRAVENOUS
  Filled 2024-03-18: qty 100

## 2024-03-18 MED ORDER — MELATONIN 5 MG PO TABS
5.0000 mg | ORAL_TABLET | Freq: Every evening | ORAL | Status: DC | PRN
  Administered 2024-03-18 – 2024-03-24 (×4): 5 mg via ORAL
  Filled 2024-03-18 (×4): qty 1

## 2024-03-18 MED ORDER — POLYETHYLENE GLYCOL 3350 17 G PO PACK: 17.0000 g | PACK | Freq: Every day | ORAL | Status: DC | PRN

## 2024-03-18 MED ORDER — VANCOMYCIN HCL 125 MG PO CAPS
125.0000 mg | ORAL_CAPSULE | Freq: Four times a day (QID) | ORAL | Status: DC
  Administered 2024-03-18 – 2024-03-19 (×8): 125 mg via ORAL
  Filled 2024-03-18 (×8): qty 1

## 2024-03-18 MED ORDER — INSULIN ASPART 100 UNIT/ML IJ SOLN
0.0000 [IU] | INTRAMUSCULAR | Status: DC
  Administered 2024-03-18: 2 [IU] via SUBCUTANEOUS
  Filled 2024-03-18: qty 0.15

## 2024-03-18 MED ORDER — SODIUM CHLORIDE 0.9 % IV SOLN
2.0000 g | INTRAVENOUS | Status: DC
  Administered 2024-03-18 – 2024-03-19 (×2): 2 g via INTRAVENOUS
  Filled 2024-03-18 (×2): qty 12.5

## 2024-03-18 MED ORDER — METRONIDAZOLE 500 MG/100ML IV SOLN: 500.0000 mg | Freq: Once | INTRAVENOUS | Status: DC

## 2024-03-18 MED ORDER — ORAL CARE MOUTH RINSE: 15.0000 mL | OROMUCOSAL | Status: DC | PRN

## 2024-03-18 MED ORDER — ACETAMINOPHEN 325 MG PO TABS
650.0000 mg | ORAL_TABLET | Freq: Four times a day (QID) | ORAL | Status: DC | PRN
  Administered 2024-03-18 – 2024-03-25 (×8): 650 mg via ORAL
  Filled 2024-03-18 (×8): qty 2

## 2024-03-18 MED ORDER — VANCOMYCIN HCL 750 MG/150ML IV SOLN: 750.0000 mg | INTRAVENOUS | Status: DC

## 2024-03-18 MED ORDER — MAGNESIUM SULFATE 2 GM/50ML IV SOLN
2.0000 g | Freq: Once | INTRAVENOUS | Status: AC
  Administered 2024-03-18: 2 g via INTRAVENOUS
  Filled 2024-03-18: qty 50

## 2024-03-18 MED ORDER — SODIUM CHLORIDE 0.9 % IV SOLN: 2.0000 g | Freq: Once | INTRAVENOUS | Status: DC

## 2024-03-18 MED ORDER — DOCUSATE SODIUM 100 MG PO CAPS
100.0000 mg | ORAL_CAPSULE | Freq: Two times a day (BID) | ORAL | Status: DC | PRN
Start: 2024-03-17 — End: 2024-03-28

## 2024-03-18 MED ORDER — ONDANSETRON HCL 4 MG/2ML IJ SOLN
4.0000 mg | Freq: Four times a day (QID) | INTRAMUSCULAR | Status: DC | PRN
  Administered 2024-03-18 – 2024-03-26 (×11): 4 mg via INTRAVENOUS
  Filled 2024-03-18 (×11): qty 2

## 2024-03-18 MED ORDER — METRONIDAZOLE 500 MG/100ML IV SOLN
500.0000 mg | Freq: Two times a day (BID) | INTRAVENOUS | Status: DC
  Administered 2024-03-18 – 2024-03-19 (×4): 500 mg via INTRAVENOUS
  Filled 2024-03-18 (×5): qty 100

## 2024-03-18 NOTE — Progress Notes (Signed)
 eLink Physician-Brief Progress Note Patient Name: DORIANA MAZURKIEWICZ DOB: [birth year] MRN: [id number]   Date of Service  03/18/2024  HPI/Events of Note  Insomnia  eICU Interventions  Melatonin PRN     Intervention Category Minor Interventions: Routine modifications to care plan (e.g. PRN medications for pain, fever)  Yuriel Lopezmartinez 03/18/2024, 11:51 PM

## 2024-03-18 NOTE — Progress Notes (Signed)
 Pharmacy Antibiotic Note  [name] is a 78 y.o. female admitted on 03/17/2024 with sepsis.  Pharmacy has been consulted for Vancomycin  dosing. AKI- baseline Scr ~ 1.05  Plan: Cefepime  2gm IV q24h Flagyl  500mg  IV q12h Vancomycin  750mg  IV q48h to target AUC 400-550.  Estimated AUC on current regimen = 432. Monitor renal function and cx data   Height: 5' 6 (167.6 cm) Weight: 65.9 kg (145 lb 4.5 oz) IBW/kg (Calculated) : 59.3  Temp (24hrs), Avg:99.9 F (37.7 C), Min:98.2 F (36.8 C), Max:103.1 F (39.5 C)  Recent Labs  Lab 03/17/24 2206  WBC 10.3  CREATININE 2.59*  LATICACIDVEN 1.9    Estimated Creatinine Clearance: 16.8 mL/min (A) (by C-G formula based on SCr of 2.59 mg/dL (H)).    Allergies  Allergen Reactions   Penicillins Rash    Antimicrobials this admission: 7/28 Cefepime  >>  7/28 Flagyl  >>  7/29 Vancomycin   Dose adjustments this admission:  Microbiology results: 7/28 BCx:  7/28 Resp PCR: negative  7/29 Cdiff antigen +; toxin -  Thank you for allowing pharmacy to be a part of this patient's care.  Rosaline Millet PharmD 03/18/2024 3:17 AM

## 2024-03-18 NOTE — Progress Notes (Signed)
  Echocardiogram 2D Echocardiogram has been performed.  [name] 03/18/2024, 10:57 AM
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
## 2024-03-18 NOTE — Consult Note (Signed)
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 PCR reflex     Status: Abnormal   Collection Time: 03/18/24  1:44 AM   Specimen: STOOL  Result Value Ref Range Status   C Diff antigen POSITIVE (A) NEGATIVE Final   C Diff toxin NEGATIVE NEGATIVE Final   C Diff interpretation Results are indeterminate. See PCR results.  Final    Comment: Performed at [facility], [address], [place], [place] [address]   Gastrointestinal Panel by PCR , Stool     Status: None   Collection Time: 03/18/24  1:44 AM   Specimen: Stool  Result Value Ref Range Status   Campylobacter species NOT DETECTED NOT DETECTED Final   Plesimonas shigelloides NOT DETECTED NOT DETECTED Final   Salmonella species NOT DETECTED NOT DETECTED Final   Yersinia enterocolitica NOT DETECTED NOT DETECTED Final   Vibrio species NOT DETECTED NOT DETECTED Final   Vibrio cholerae NOT DETECTED NOT DETECTED Final   Enteroaggregative E coli (EAEC) NOT DETECTED NOT DETECTED Final   Enteropathogenic E coli (EPEC) NOT DETECTED NOT DETECTED Final   Enterotoxigenic E coli (ETEC) NOT DETECTED NOT DETECTED Final   Shiga like toxin producing E coli (STEC) NOT DETECTED NOT DETECTED Final   Shigella/Enteroinvasive E coli (EIEC) NOT DETECTED NOT DETECTED Final   Cryptosporidium NOT DETECTED NOT DETECTED Final   Cyclospora cayetanensis NOT DETECTED NOT DETECTED Final   Entamoeba histolytica NOT DETECTED NOT DETECTED Final   Giardia lamblia NOT DETECTED NOT DETECTED Final   Adenovirus F40/41 NOT DETECTED NOT DETECTED Final   Astrovirus NOT DETECTED NOT DETECTED Final   Norovirus GI/GII NOT DETECTED NOT DETECTED Final   Rotavirus A NOT DETECTED NOT DETECTED Final   Sapovirus (I, II, IV, and V) NOT DETECTED NOT DETECTED Final    Comment: Performed at [facility], [address] [place], [place] [address]  C. Diff by PCR, Reflexed     Status: None   Collection Time: 03/18/24  1:44 AM  Result Value Ref Range Status   Toxigenic C. Difficile by PCR NEGATIVE NEGATIVE Final    Comment: Patient is colonized with non toxigenic C. difficile. May not need treatment unless significant symptoms are present. Performed at [facility], [address] [place], [place] [address]   MRSA Next Gen by PCR, Nasal     Status: None   Collection Time: 03/18/24  3:45 AM   Specimen: Nasal Mucosa; Nasal Swab  Result Value Ref Range Status   MRSA by PCR  Next Gen NOT DETECTED NOT DETECTED Final    Comment: (NOTE) The GeneXpert MRSA Assay (FDA approved for NASAL specimens only), is one component of a comprehensive MRSA colonization surveillance program. It is not intended to diagnose MRSA infection nor to guide or monitor treatment for MRSA infections. Test performance is not FDA approved in patients less than 36 years old. Performed at [facility], [address], [place], [place] [address]     Renal Function: Recent Labs    03/17/24 2206 03/18/24 1014  CREATININE 2.59* 2.24*   Estimated Creatinine Clearance: 19.4 mL/min (A) (by C-G formula based on SCr of 2.24 mg/dL (H)).  Radiologic Imaging: ECHOCARDIOGRAM COMPLETE Result Date: 03/18/2024    ECHOCARDIOGRAM REPORT   Patient Name:   [name] Date of Exam: 03/18/2024 Medical Rec #:  [id number]       Height:       66.0 in Accession #:    [id number]  Weight:       145.3 lb Date of Birth:  Sep 13, 1945       BSA:          1.746 m Patient Age:    49 years        BP:           99/60 mmHg Patient Gender: F               HR:           67 bpm. Exam Location:  Inpatient Procedure: 2D Echo, Cardiac Doppler and Color Doppler (Both Spectral and Color            Flow Doppler were utilized during procedure). Indications:    Shock  History:        Patient has prior history of Echocardiogram examinations, most                 recent 02/10/2019. Abnormal ECG, Arrythmias:Atrial Fibrillation,                 Signs/Symptoms:Shortness of Breath and Dyspnea; Risk                 Factors:Hypertension, Dyslipidemia and Former Smoker. Lung                 cancer.  Sonographer:    Ellouise Mose RDCS Referring Phys: [id number] JESSICA MARSHALL IMPRESSIONS  1. Left ventricular ejection fraction, by estimation, is 60 to 65%. The left ventricle has normal function. The left ventricle has no regional wall motion abnormalities. Left ventricular diastolic parameters are indeterminate.  2. Right ventricular  systolic function is normal. The right ventricular size is mildly enlarged. There is moderately elevated pulmonary artery systolic pressure. The estimated right ventricular systolic pressure is 49.3 mmHg.  3. Left atrial size was severely dilated.  4. Right atrial size was severely dilated.  5. The mitral valve is normal in structure. Mild mitral valve regurgitation. No evidence of mitral stenosis.  6. Tricuspid valve regurgitation is mild to moderate.  7. The aortic valve is tricuspid. Aortic valve regurgitation is trivial. Aortic valve sclerosis is present, with no evidence of aortic valve stenosis.  8. The inferior vena cava is dilated in size with <50% respiratory variability, suggesting right atrial pressure of 15 mmHg. Comparison(s): Prior images reviewed side by side. Right ventricular and bi-atrial dilation from prior study report. Unable to load 2020 images. FINDINGS  Left Ventricle: Left ventricular ejection fraction, by estimation, is 60 to 65%. The left ventricle has normal function. The left ventricle has no regional wall motion abnormalities. The left ventricular internal cavity size was normal in size. There is  no left ventricular hypertrophy. Left ventricular diastolic parameters are indeterminate. Right Ventricle: The right ventricular size is mildly enlarged. No increase in right ventricular wall thickness. Right ventricular systolic function is normal. There is moderately elevated pulmonary artery systolic pressure. The tricuspid regurgitant velocity is 2.93 m/s, and with an assumed right atrial pressure of 15 mmHg, the estimated right ventricular systolic pressure is 49.3 mmHg. Left Atrium: Left atrial size was severely dilated. Right Atrium: Right atrial size was severely dilated. Pericardium: There is no evidence of pericardial effusion. Mitral Valve: The mitral valve is normal in structure. Mild mitral valve regurgitation. No evidence of mitral valve stenosis. MV peak gradient, 7.0 mmHg. The  mean mitral valve gradient is 3.0 mmHg. Tricuspid Valve: The tricuspid valve is normal in structure. Tricuspid valve regurgitation is mild to moderate. No evidence of  tricuspid stenosis. Aortic Valve: The aortic valve is tricuspid. Aortic valve regurgitation is trivial. Aortic valve sclerosis is present, with no evidence of aortic valve stenosis. Pulmonic Valve: The pulmonic valve was normal in structure. Pulmonic valve regurgitation is mild. No evidence of pulmonic stenosis. Aorta: The aortic root and ascending aorta are structurally normal, with no evidence of dilitation. Venous: The inferior vena cava is dilated in size with less than 50% respiratory variability, suggesting right atrial pressure of 15 mmHg. IAS/Shunts: No atrial level shunt detected by color flow Doppler.  LEFT VENTRICLE PLAX 2D LVIDd:         3.60 cm LVIDs:         2.50 cm LV PW:         1.10 cm LV IVS:        1.20 cm LVOT diam:     2.40 cm LV SV:         57 LV SV Index:   32 LVOT Area:     4.52 cm  LV Volumes (MOD) LV vol d, MOD A2C: 56.8 ml LV vol d, MOD A4C: 38.2 ml LV vol s, MOD A2C: 14.8 ml LV vol s, MOD A4C: 11.2 ml LV SV MOD A2C:     42.0 ml LV SV MOD A4C:     38.2 ml LV SV MOD BP:      35.5 ml RIGHT VENTRICLE            IVC RV S prime:     6.74 cm/s  IVC diam: 2.80 cm TAPSE (M-mode): 1.4 cm LEFT ATRIUM              Index        RIGHT ATRIUM           Index LA diam:        4.10 cm  2.35 cm/m   RA Area:     37.20 cm LA Vol (A2C):   87.5 ml  50.12 ml/m  RA Volume:   154.00 ml 88.21 ml/m LA Vol (A4C):   101.0 ml 57.85 ml/m LA Biplane Vol: 94.5 ml  54.13 ml/m  AORTIC VALVE             PULMONIC VALVE LVOT Vmax:   69.00 cm/s  PR End Diast Vel: 1.98 msec LVOT Vmean:  44.700 cm/s LVOT VTI:    0.125 m  AORTA Ao Root diam: 3.40 cm Ao Asc diam:  3.70 cm MITRAL VALVE                TRICUSPID VALVE MV Area (PHT): 4.15 cm     TR Peak grad:   34.3 mmHg MV Area VTI:   2.74 cm     TR Vmax:        293.00 cm/s MV Peak grad:  7.0 mmHg MV Mean grad:   3.0 mmHg     SHUNTS MV Vmax:       1.32 m/s     Systemic VTI:  0.12 m MV Vmean:      74.5 cm/s    Systemic Diam: 2.40 cm MV Decel Time: 183 msec MV E velocity: 124.00 cm/s Stanly Leavens MD Electronically signed by Stanly Leavens MD Signature Date/Time: 03/18/2024/11:03:50 AM    Final    US  RENAL Result Date: 03/18/2024 CLINICAL DATA:  Acute kidney injury EXAM: RENAL / URINARY TRACT ULTRASOUND COMPLETE COMPARISON:  CT 03/18/2024 FINDINGS: Right Kidney: Absent Left Kidney: Renal measurements: 13.9 x 6.1 x 4.7 cm = volume: 209  mL. Renal cortical thickness is preserved cortical echogenicity is within normal limits. Mild to moderate hydronephrosis is present with distension of the renal pelvis. No intrarenal masses or calcifications are seen. Bladder: Asymmetric bladder wall thickening is seen posterosuperiorly. An infiltrative mural mass is not excluded. Other: None. IMPRESSION: 1. Mild to moderate left hydronephrosis. 2. Asymmetric bladder wall thickening posterosuperiorly. An infiltrative mural mass is not excluded. Correlation with cystoscopy may be helpful for further evaluation. Electronically Signed   By: Dorethia Molt M.D.   On: 03/18/2024 02:35   CT ABDOMEN PELVIS WO CONTRAST Result Date: 03/18/2024 CLINICAL DATA:  Lower abdominal pain EXAM: CT ABDOMEN AND PELVIS WITHOUT CONTRAST TECHNIQUE: Multidetector CT imaging of the abdomen and pelvis was performed following the standard protocol without IV contrast. RADIATION DOSE REDUCTION: This exam was performed according to the departmental dose-optimization program which includes automated exposure control, adjustment of the mA and/or kV according to patient size and/or use of iterative reconstruction technique. COMPARISON:  01/02/2024, 05/02/2011 FINDINGS: Lower chest: Mild cardiomegaly. Moderate hiatal hernia. No acute abnormality. Hepatobiliary: Status post cholecystectomy 5.4 cm exophytic mass arising from the lateral 7 the left hepatic lobe  demonstrates extensive dystrophic calcification. This mass has decreased in size since remote prior examination 05/02/2011 and is indeterminate but likely benign, possibly the residua of treated disease the sequela prior trauma or inflammation. The liver is otherwise un Pancreas: Unremarkable Spleen: Unremarkable Adrenals/Urinary Tract: The adrenal glands are unremarkable. 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 Mild left hydronephrosis has developed to the level the bladder trigone, however, distal obstructing mass or calculus is not clearly identified. No intrarenal or ureteral calculi. Bladder demonstrates asymmetric wall thickening, better appreciated than on prior examination indent underlying infiltrative mural mass is not excluded. Stomach/Bowel: Stomach is within normal limits. Appendix is not clearly identified, however, no secondary signs of appendicitis within the right lower quadrant. No evidence of bowel wall thickening, distention, or inflammatory changes. Vascular/Lymphatic: Aortic atherosclerosis. No enlarged abdominal or pelvic lymph nodes. Reproductive: Prostate is unremarkable. Other: No abdominal wall hernia or abnormality. No abdominopelvic ascites. 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 L3-4 lumbar fusion with instrumentation and L3 posterior decompression has been performed. No acute bone abnormality. No lytic or blastic bone lesion. IMPRESSION: 1. Interval development of mild left hydronephrosis to the level of the bladder trigone. No distal obstructing mass or calculus is identified. There is, however, asymmetric bladder wall thickening and this may result in obstruction at the ureterovesicular junction. Correlation with cystoscopy the presence of an underlying mass may be helpful for further evaluation. 2. Moderate hiatal hernia. 3. 5.4 cm exophytic mass arising from the left hepatic lobe with extensive dystrophic  calcification, decreased in size since remote prior examination of 05/02/2011 and likely benign. 4. Status post cholecystectomy. 5. Absent right nephrectomy. 6. Status post L3-4 fusion and posterior decompression. Aortic Atherosclerosis (ICD10-I70.0). Electronically Signed   By: Dorethia Molt M.D.   On: 03/18/2024 02:32   DG Chest 2 View Result Date: 03/17/2024 CLINICAL DATA:  Altered mental status. Increasing weakness, multiple falls, and confusion. Abdominal pain. EXAM: CHEST - 2 VIEW COMPARISON:  01/30/2024 FINDINGS: Mild cardiac enlargement. Volume loss in the right lung with right mid lung opacity. Opacity appears more dense than on the prior study. This may represent progressing area of atelectasis, pneumonia, or could indicate a mass lesion or postoperative change. See previous CT evaluation from 12/21/2023. Left lung is clear. No pleural effusion or pneumothorax.  Mediastinal contours appear intact. Moderate-sized esophageal hiatal hernia behind the heart. Calcification of the aorta. Degenerative changes in the spine. IMPRESSION: 1. Cardiac enlargement. 2. 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 This appears mildly increased since prior chest radiograph. This was evaluated at previous CT 12/21/2023. Electronically Signed   By: Elsie Gravely M.D.   On: 03/17/2024 22:32    I independently reviewed the above imaging studies.  Impression/Recommendation: 78 yo F with solitary kidney, AKI, left hydro tracking to UVJ that appears to date back to at least May without evidence of acute obstruction. At this time, I do not think any emergent intervention indicated. That being said, will follow closely. If creatinine fails to improve, or if does condition does not improve with conservative measures, would likely recommend stent. Will make patient NPO at midnight to be safe.  Herlene Foot 03/18/2024, 3:48 PM  [facility]  Pager: [phone]

## 2024-03-18 NOTE — Plan of Care (Signed)
  Problem: Education: Goal: Individualized Educational Video(s) Outcome: Progressing   Problem: Coping: Goal: Ability to adjust to condition or change in health will improve Outcome: Progressing   Problem: Fluid Volume: Goal: Ability to maintain a balanced intake and output will improve Outcome: Progressing   Problem: Health Behavior/Discharge Planning: Goal: Ability to identify and utilize available resources and services will improve Outcome: Progressing Goal: Ability to manage health-related needs will improve Outcome: Progressing

## 2024-03-18 NOTE — Progress Notes (Signed)
 NAME:  NEFTALI THUROW, MRN:  [id number], DOB:  [birth year], LOS: 0 ADMISSION DATE:  03/17/2024, CONSULTATION DATE:  03/17/2024 REFERRING MD: Yolande - EDP, CHIEF COMPLAINT: Hypotension   History of Present Illness:  78 yo female presented today at the instance of her family after finding her more altered this evening. Pt reportedly has been falling recently, however daughter and husband at bedside endorses that pt has not had trauma with her weakness/falls. When pt falls she is usually standing up and slides down. Never loses consciousness. No head or other body trauma. Pt states the only discomfort she has is with her abdomen. Daughter states that pt has had multiple days of diarrhea (c/w her previous cdiff infections), pt states that she does have nausea but this is typical for her, denies emesis.   She states she has previously had dark stool but has not appreciated that since she stopped her Iron supplements. She does have hematuria and states that she passes clots from her urinary tract. She has seen urology as outpt and after cystoscopy they noted cracks in her bladder and she states that the urologist feels her hematuria is 2/2 eliquis  use as well. + temp at home 101-102. 103 at the hospital, but no other si/sx of sepsis (less her diarrhea). She denies any recent steroid use, no recent abx use.   Family feels as though pt's mental status is improving since her presentation. She is on 2mcg of norepi at this time. Gi panel and cdiff + ua pending.   PCCM was asked for admission 2/2 her need for low dose norepi.   Pertinent Medical History:   H/o lung cancer Recurrent C. diff colitis AFib on chronic Eliquis  H/o CVA CKD stage 3a  Significant Hospital Events: Including procedures, antibiotic start and stop dates in addition to other pertinent events   Admitted to ICU 7/28.  Interim History / Subjective:  Admitted overnight for hypotension, diarrhea, anemia; initial c/f GIB FOBT  negative and Hgb improved s/p 2U PRBCs Requiring NE 2-66mcg, looking/feeling better overall Has an appetite and more energy today, daughter states she is more herself today Uro consulted for recs re: hematuria/?UVJ obstruction D/w GI re: appt Thursday  Objective:   Blood pressure (!) 101/49, pulse 78, temperature 100.2 F (37.9 C), temperature source Axillary, resp. rate 17, height 5' 6 (1.676 m), weight 65.9 kg, SpO2 98%.        Intake/Output Summary (Last 24 hours) at 03/18/2024 0721 Last data filed at 03/18/2024 9281 Gross per 24 hour  Intake 3352.11 ml  Output --  Net 3352.11 ml   Filed Weights   03/18/24 0229  Weight: 65.9 kg   Physical Examination: General: Acutely ill-appearing older woman in NAD. Pleasant and conversant. HEENT: [place]/AT, anicteric sclera, PERRL, moist mucous membranes. Neuro: Awake, oriented x 4. Responds to verbal stimuli. Following commands consistently. Moves all 4 extremities spontaneously. Mild generalized weakness. CV: Irregularly irregular rhythm, rate 70s, no m/g/r. PULM: Breathing even and unlabored on RA. Lung fields CTAB. GI: Soft, mild generalized TTP, nondistended. Slightly hyperactive bowel sounds. Extremities: Trace bilateral symmetric LE edema noted. Skin: Warm/dry, no rashes.  Resolved Problem List:   Assessment and Plan:  Acute hypotension, multifactorial - Monitor in ICU - Goal MAP > 65 - Fluid resuscitation as tolerated - Levophed  titrated to goal MAP - Consider midodrine initiation - Trend WBC, fever curve - F/u Cx data - Continue broad-spectrum antibiotics (cefepime /vanc/Flagyl , narrow as able)  AMS, improved with improvement in hemodynamics Frequent falls -  Limit sedating medications as able - Correct metabolic derangements - PT/OT  Acute-on-chronic diarrhea Follows with GI (Dr. Sherrill Roca), due to see in office 7/31. - PPI BID for now - Viral GI panel pending - C. Diff Ag positive, toxin/PCR negative - F/u  with GI  Hematuria L hydronephrosis Intermittent, worse as of late. Exacerbated by Eliquis  use, passes clots fairly regularly. Underwent cystoscopy ~1 year ago, followed by Dr. Selma. L hydro noted on US  Renal/CT A/P, not completely new but worse from prior, c/f possible mass/structural issue at UVJ. - F/u urine studies - Urology consulted, appreciate recommendations - May require repeat cystoscopy, possibly as outpatient, no urgent indication - Possible need for stent if worsening hydro in the setting of unilateral kidney  AKI on CKD stage 3a S/p R nephrectomy, remote Hyponatremia Hypokalemia Hypomagnesemia NAGMA, improved - Trend BMP - Replete electrolytes as indicated - Monitor I&Os - Avoid nephrotoxic agents as able - Ensure adequate renal perfusion  Afib - Cardiac monitoring - Optimize electrolytes for K > 4, Mg > 2 - Eliquis  for AC, held at present in the setting of Hgb drift/hematura - F/u Echo  Anemia, likely ABLA versus component of ACD - Trend H&H, now stabilized - Monitor for signs of active bleeding - Transfuse for Hgb < 7.0 or hemodynamically significant bleeding - Received Kcentra  on ED presentation and 2U PRBCs transfused  Best Practice (right click and Reselect all SmartList Selections daily)   Diet/type: NPO w/ oral meds DVT prophylaxis SCD Pressure ulcer(s): present on admission  GI prophylaxis: PPI Lines: N/A Foley:  Yes, and it is still needed Code Status:  full code Last date of multidisciplinary goals of care discussion [--]  Critical care time:    The patient is critically ill with multiple organ system failure and requires high complexity decision making for assessment and support, frequent evaluation and titration of therapies, advanced monitoring, review of radiographic studies and interpretation of complex data.   Critical Care Time devoted to patient care services, exclusive of separately billable procedures, described in this note is 38  minutes.  Corean CHRISTELLA Taryne Kiger, PA-C [facility] 03/18/24 7:22 AM  Please see [email or web address] for pager details.  From 7A-7P if no response, please call [phone] After hours, please call ELink [phone]

## 2024-03-18 NOTE — Progress Notes (Addendum)
 eLink Physician-Brief Progress Note Patient Name: SHAWNTAE LOWY DOB: Mar 02, 1946 MRN: [id number]   Date of Service  03/18/2024  HPI/Events of Note  78yo female with a history of lung cancer, cdiff, afib on chronic anticoagulation, and CKD who comes in with recurrent falls without loss of consciousness, multiple days of diarrhea and to have hematuria and acute GI bleed.  She was initially febrile but vital signs are now within normal limits saturating 98% on room air.  She is on low-dose norepinephrine  and is status post broad-spectrum antibiotics.  She received [facility] reversal.  Results show numerous electrolyte abnormalities, metabolic acidosis, elevated creatinine, hemoglobin of 6.8.  Radiographic imaging reviewed.  eICU Interventions  Plan for ongoing broad-spectrum antibiotics  Therapeutic pantoprazole , reversal of Eliquis , aggressive electrolyte repletion  Additional magnesium  supplementation.  Hold potassium and calcium  until next check  Norepinephrine  as needed to maintain MAP greater than 65, transfuse 1 unit of PRBC  Tylenol  x 1 for headache  Add Zofran  for nausea  DVT prophylaxis with SCDs in setting of active bleeding GI prophylaxis with therapeutic pantoprazole    0357 -patient is somewhat restless and difficult to obtain blood pressures.  After some clonidine , patient is resting comfortably but continues to have tremulous movements.  Takes Prozac  and Wellbutrin  at home, not appropriate at this time.  Anxiolysis with nonpharmacological treatments for now, encourage reorientation and bedside family intervention if the patient becomes significantly anxious  Intervention Category Evaluation Type: New Patient Evaluation  Kasandra Fehr 03/18/2024, 2:34 AM

## 2024-03-19 DIAGNOSIS — E878 Other disorders of electrolyte and fluid balance, not elsewhere classified: Secondary | ICD-10-CM | POA: Diagnosis not present

## 2024-03-19 DIAGNOSIS — D649 Anemia, unspecified: Secondary | ICD-10-CM | POA: Diagnosis not present

## 2024-03-19 DIAGNOSIS — Z8619 Personal history of other infectious and parasitic diseases: Secondary | ICD-10-CM

## 2024-03-19 DIAGNOSIS — R579 Shock, unspecified: Secondary | ICD-10-CM

## 2024-03-19 DIAGNOSIS — R197 Diarrhea, unspecified: Secondary | ICD-10-CM | POA: Diagnosis not present

## 2024-03-19 DIAGNOSIS — E876 Hypokalemia: Secondary | ICD-10-CM

## 2024-03-19 DIAGNOSIS — G9341 Metabolic encephalopathy: Secondary | ICD-10-CM | POA: Diagnosis not present

## 2024-03-19 DIAGNOSIS — N179 Acute kidney failure, unspecified: Secondary | ICD-10-CM

## 2024-03-19 DIAGNOSIS — Z7901 Long term (current) use of anticoagulants: Secondary | ICD-10-CM

## 2024-03-19 DIAGNOSIS — N1831 Chronic kidney disease, stage 3a: Secondary | ICD-10-CM

## 2024-03-19 DIAGNOSIS — D62 Acute posthemorrhagic anemia: Secondary | ICD-10-CM | POA: Diagnosis present

## 2024-03-19 DIAGNOSIS — E039 Hypothyroidism, unspecified: Secondary | ICD-10-CM

## 2024-03-19 DIAGNOSIS — I4819 Other persistent atrial fibrillation: Secondary | ICD-10-CM

## 2024-03-19 DIAGNOSIS — N3001 Acute cystitis with hematuria: Secondary | ICD-10-CM | POA: Diagnosis not present

## 2024-03-19 DIAGNOSIS — D638 Anemia in other chronic diseases classified elsewhere: Secondary | ICD-10-CM

## 2024-03-19 LAB — TYPE AND SCREEN
ABO/RH(D): A POS
Antibody Screen: NEGATIVE
Unit division: 0
Unit division: 0

## 2024-03-19 LAB — CBC
HCT: 35 % — ABNORMAL LOW (ref 36.0–46.0)
Hemoglobin: 10.8 g/dL — ABNORMAL LOW (ref 12.0–15.0)
MCH: 27.6 pg (ref 26.0–34.0)
MCHC: 30.9 g/dL (ref 30.0–36.0)
MCV: 89.5 fL (ref 80.0–100.0)
Platelets: 175 K/uL (ref 150–400)
RBC: 3.91 MIL/uL (ref 3.87–5.11)
RDW: 14.6 % (ref 11.5–15.5)
WBC: 6.4 K/uL (ref 4.0–10.5)
nRBC: 0 % (ref 0.0–0.2)

## 2024-03-19 LAB — GLUCOSE, CAPILLARY
Glucose-Capillary: 99 mg/dL (ref 70–99)
Glucose-Capillary: 105 mg/dL — ABNORMAL HIGH (ref 70–99)
Glucose-Capillary: 93 mg/dL (ref 70–99)
Glucose-Capillary: 101 mg/dL — ABNORMAL HIGH (ref 70–99)
Glucose-Capillary: 112 mg/dL — ABNORMAL HIGH (ref 70–99)

## 2024-03-19 LAB — COMPREHENSIVE METABOLIC PANEL WITH GFR
ALT: 16 U/L (ref 0–44)
AST: 15 U/L (ref 15–41)
Albumin: 2.4 g/dL — ABNORMAL LOW (ref 3.5–5.0)
Alkaline Phosphatase: 57 U/L (ref 38–126)
Anion gap: 6 (ref 5–15)
BUN: 29 mg/dL — ABNORMAL HIGH (ref 8–23)
CO2: 18 mmol/L — ABNORMAL LOW (ref 22–32)
Calcium: 9 mg/dL (ref 8.9–10.3)
Chloride: 109 mmol/L (ref 98–111)
Creatinine, Ser: 1.97 mg/dL — ABNORMAL HIGH (ref 0.44–1.00)
GFR, Estimated: 26 mL/min — ABNORMAL LOW (ref >60)
Glucose, Bld: 116 mg/dL — ABNORMAL HIGH (ref 70–99)
Potassium: 4.7 mmol/L (ref 3.5–5.1)
Sodium: 133 mmol/L — ABNORMAL LOW (ref 135–145)
Total Bilirubin: 1.1 mg/dL (ref 0.0–1.2)
Total Protein: 5.1 g/dL — ABNORMAL LOW (ref 6.5–8.1)

## 2024-03-19 LAB — TROPONIN I (HIGH SENSITIVITY): Troponin I (High Sensitivity): 16 ng/L (ref <18)

## 2024-03-19 LAB — BPAM RBC
Blood Product Expiration Date: 202508242359
Blood Product Unit Number: 202508242359
ISSUE DATE / TIME: 202507290411
PRODUCT CODE: 202507290043
PRODUCT CODE: 202508242359
Unit Type and Rh: 202508242359
Unit Type and Rh: 6200
Unit Type and Rh: 6200
Unit Type and Rh: 6200

## 2024-03-19 LAB — PROTIME-INR
INR: 1.3 — ABNORMAL HIGH (ref 0.8–1.2)
Prothrombin Time: 16.9 s — ABNORMAL HIGH (ref 11.4–15.2)

## 2024-03-19 LAB — AMMONIA: Ammonia: 15 umol/L (ref 9–35)

## 2024-03-19 LAB — HEMOGLOBIN AND HEMATOCRIT, BLOOD
HCT: 35.7 % — ABNORMAL LOW (ref 36.0–46.0)
HCT: 36.5 % (ref 36.0–46.0)
Hemoglobin: 11.2 g/dL — ABNORMAL LOW (ref 12.0–15.0)
Hemoglobin: 11.4 g/dL — ABNORMAL LOW (ref 12.0–15.0)

## 2024-03-19 LAB — MAGNESIUM: Magnesium: 2.6 mg/dL — ABNORMAL HIGH (ref 1.7–2.4)

## 2024-03-19 LAB — PHOSPHORUS: Phosphorus: 2.4 mg/dL — ABNORMAL LOW (ref 2.5–4.6)

## 2024-03-19 LAB — LACTIC ACID, PLASMA: Lactic Acid, Venous: 1.3 mmol/L (ref 0.5–1.9)

## 2024-03-19 LAB — PROCALCITONIN: Procalcitonin: 1.66 ng/mL

## 2024-03-19 MED ORDER — LORATADINE 10 MG PO TABS: 10.0000 mg | ORAL_TABLET | Freq: Every day | ORAL | Status: DC | PRN

## 2024-03-19 MED ORDER — BUPROPION HCL 75 MG PO TABS
75.0000 mg | ORAL_TABLET | Freq: Every morning | ORAL | Status: DC
  Administered 2024-03-19 – 2024-03-28 (×10): 75 mg via ORAL
  Filled 2024-03-19 (×12): qty 1

## 2024-03-19 MED ORDER — FLUOXETINE HCL 20 MG PO CAPS
30.0000 mg | ORAL_CAPSULE | Freq: Every day | ORAL | Status: DC
  Administered 2024-03-19 – 2024-03-27 (×9): 30 mg via ORAL
  Filled 2024-03-19 (×10): qty 1

## 2024-03-19 MED ORDER — INSULIN ASPART 100 UNIT/ML IJ SOLN
0.0000 [IU] | Freq: Four times a day (QID) | INTRAMUSCULAR | Status: DC
  Administered 2024-03-22 – 2024-03-23 (×2): 3 [IU] via SUBCUTANEOUS

## 2024-03-19 MED ORDER — COLESTIPOL HCL 1 G PO TABS
1.0000 g | ORAL_TABLET | Freq: Two times a day (BID) | ORAL | Status: DC
  Administered 2024-03-19 – 2024-03-28 (×19): 1 g via ORAL
  Filled 2024-03-19 (×21): qty 1

## 2024-03-19 MED ORDER — VITAMIN D3 25 MCG (1000 UNIT) PO TABS
1000.0000 [IU] | ORAL_TABLET | Freq: Every day | ORAL | Status: DC
  Administered 2024-03-19 – 2024-03-28 (×10): 1000 [IU] via ORAL
  Filled 2024-03-19 (×11): qty 1

## 2024-03-19 MED ORDER — BISOPROLOL FUMARATE 5 MG PO TABS
2.5000 mg | ORAL_TABLET | Freq: Every day | ORAL | Status: DC
  Administered 2024-03-19 – 2024-03-28 (×10): 2.5 mg via ORAL
  Filled 2024-03-19 (×12): qty 1

## 2024-03-19 MED ORDER — FLUOXETINE HCL 20 MG PO CAPS: 20.0000 mg | ORAL_CAPSULE | Freq: Every day | ORAL | Status: DC

## 2024-03-19 MED ORDER — SODIUM CHLORIDE 0.9 % IV SOLN: INTRAVENOUS | Status: DC

## 2024-03-19 MED ORDER — ALBUTEROL SULFATE (2.5 MG/3ML) 0.083% IN NEBU: 3.0000 mL | INHALATION_SOLUTION | Freq: Four times a day (QID) | RESPIRATORY_TRACT | Status: DC | PRN

## 2024-03-19 MED ORDER — PSYLLIUM 95 % PO PACK
1.0000 | PACK | Freq: Every day | ORAL | Status: DC
  Administered 2024-03-19 – 2024-03-28 (×9): 1 via ORAL
  Filled 2024-03-19 (×10): qty 1

## 2024-03-19 MED ORDER — FLUOXETINE HCL 20 MG PO CAPS
20.0000 mg | ORAL_CAPSULE | Freq: Every day | ORAL | Status: DC
Start: 2024-03-19 — End: 2024-03-19

## 2024-03-19 MED ORDER — FLUOXETINE HCL 20 MG PO CAPS: 30.0000 mg | ORAL_CAPSULE | Freq: Every day | ORAL | Status: DC

## 2024-03-19 MED ORDER — GABAPENTIN 300 MG PO CAPS
300.0000 mg | ORAL_CAPSULE | Freq: Two times a day (BID) | ORAL | Status: AC
Start: 2024-03-19 — End: ?
  Administered 2024-03-19 – 2024-03-28 (×18): 300 mg via ORAL
  Filled 2024-03-19 (×19): qty 1

## 2024-03-19 MED ORDER — SACCHAROMYCES BOULARDII 250 MG PO CAPS
250.0000 mg | ORAL_CAPSULE | Freq: Two times a day (BID) | ORAL | Status: DC
  Administered 2024-03-19 – 2024-03-28 (×19): 250 mg via ORAL
  Filled 2024-03-19 (×20): qty 1

## 2024-03-19 MED ORDER — GABAPENTIN 300 MG PO CAPS
600.0000 mg | ORAL_CAPSULE | Freq: Two times a day (BID) | ORAL | Status: DC
  Administered 2024-03-19: 600 mg via ORAL
  Filled 2024-03-19: qty 2

## 2024-03-19 MED ORDER — K PHOS MONO-SOD PHOS DI & MONO 155-852-130 MG PO TABS: 250.0000 mg | ORAL_TABLET | Freq: Two times a day (BID) | ORAL | Status: DC

## 2024-03-19 MED ORDER — LEVOTHYROXINE SODIUM 88 MCG PO TABS
88.0000 ug | ORAL_TABLET | Freq: Every day | ORAL | Status: DC
  Administered 2024-03-19 – 2024-03-28 (×10): 88 ug via ORAL
  Filled 2024-03-19 (×10): qty 1

## 2024-03-19 MED ORDER — K PHOS MONO-SOD PHOS DI & MONO 155-852-130 MG PO TABS
250.0000 mg | ORAL_TABLET | Freq: Two times a day (BID) | ORAL | Status: AC
  Administered 2024-03-19 – 2024-03-21 (×6): 250 mg via ORAL
  Filled 2024-03-19 (×7): qty 1

## 2024-03-19 NOTE — Progress Notes (Addendum)
 PROGRESS NOTE    [name]  FMW:[id number] DOB: [birth year] DOA: 03/17/2024 PCP: Avva, Ravisankar, MD    Chief Complaint  Patient presents with   Abdominal Pain   Altered Mental Status   Fever    Brief Narrative:  Per HPI 78 yo female presented today at the instance of her family after finding her more altered this evening. Pt reportedly has been falling recently, however daughter and husband at bedside endorses that pt has not had trauma with her weakness/falls. When pt falls she is usually standing up and slides down. Never loses consciousness. No head or other body trauma. Pt states the only discomfort she has is with her abdomen. Daughter states that pt has had multiple days of diarrhea (c/w her previous cdiff infections), pt states that she does have nausea but this is typical for her, denies emesis.    She states she has previously had dark stool but has not appreciated that since she stopped her Iron supplements. She does have hematuria and states that she passes clots from her urinary tract. She has seen urology as outpt and after cystoscopy they noted cracks in her bladder and she states that the urologist feels her hematuria is 2/2 eliquis  use as well. + temp at home 101-102. 103 at the hospital, but no other si/sx of sepsis (less her diarrhea). She denies any recent steroid use, no recent abx use.    Family feels as though pt's mental status is improving since her presentation. She is on 2mcg of norepi at this time. Gi panel and cdiff + ua pending.    PCCM was asked for admission 2/2 her need for low dose norepi.  Urology consulted due to concerns for left hydronephrosis.   Assessment & Plan:   Principal Problem:   Shock (HCC) Active Problems:   Hypokalemia   Acute cystitis   Persistent atrial fibrillation (HCC)   Anticoagulant long-term use   Acute encephalopathy   Hypomagnesemia   Hypothyroidism   Acute metabolic encephalopathy   Acute renal failure  superimposed on stage 3a chronic kidney disease (HCC)   Hypophosphatemia   Acute blood loss anemia   Anemia of chronic disease  #1 hypotension/shock -Likely multifactorial secondary to volume depletion in the setting of diarrhea acute on chronic, probable UTI. - Patient initially required pressors which have subsequently been weaned off.   - BP improved.   - Check urine cultures.   - GI pathogen panel negative, C. difficile PCR noted to be positive for C. difficile antigen, C. difficile toxin negative likely colonization.  - Fever curve trending down.   - Leukocytosis trending down.   - Continue current empiric antibiotics of cefepime /Flagyl . - Discontinue vancomycin .  2.  Acute cystitis -Urinalysis concerning for UTI. -Check urine cultures. -Continue IV cefepime . - Follow.  3.  AMS/acute encephalopathy - Improved with improvement with blood pressure and correction of metabolic derangements.  - Patient likely at baseline.  4.  Acute on chronic diarrhea -Patient with history of acute on chronic diarrhea, noted to follow-up in the outpatient setting with Dr. Albertus and noted due to be seen in the office on 03/20/2024 per daughter.   - GI pathogen panel negative.   - C. difficile antigen positive, C. difficile toxin and PCR negative.   - Clinical improvement.   - Family insistent on GI evaluation during this hospitalization and as such we will consult with GI for further evaluation and management.  5.  Hematuria/left hydronephrosis/solitary kidney -Patient noted to have  had some intermittent hematuria noted to have worsening and exacerbated by Eliquis  use as passes clots fairly regularly.   - Patient noted to have undergone a cystoscopy approximately a year ago being followed by Dr. Selma. - CT abdomen and pelvis as well as renal ultrasound with left hydronephrosis noted not completely new but worse from prior going all the way down to the UVJ.   - Patient seen in consultation by  urology, who reviewed imaging and imaging without any evidence of acute obstruction and as such did not think any emergent intervention was indicated but are following closely.  - Patient made n.p.o. by urology in anticipation of possible procedure to be done today.  - Renal function improving with hydration.   - Continue IV fluids.   - Per urology.  6.  AKI on CKD stage IIIa/status post right nephrectomy remote - Patient on presentation to be in acute kidney injury with a creatinine on presentation of 2.59. - Last creatinine noted of 1.55 (01/31/2024). - Likely secondary to prerenal azotemia as patient noted to be hypotensive on presentation. - Urinalysis turbid, large hemoglobin, large leukocytes, nitrite negative, many bacteria, WBC > 50. - Check urine cultures. - Renal ultrasound and CT abdomen and pelvis with noted mild left hydronephrosis to the level of the bladder trigone, no obstructing mass or calculi is identified. - Patient seen in consultation by urology who are recommending conservative treatments and if no improvement with renal function I have recommended probable stent placement. - Renal function slowly trending down currently at 1.97. - Continue gentle hydration with IV fluids - Avoid nephrotoxins.  7.  Hypomagnesemia/hypokalemia/hypophosphatemia - Potassium currently at 4.7, magnesium  at 2.6, phosphorus at 2.4. - K-Phos 250 mg p.o. twice daily x 3 days. - Repeat labs in AM.  8.  NAGMA -Improving. - Follow.  9.  Chronic A-fib -Patient in A-fib however rate currently controlled. - Resume home regimen bisoprolol  for rate control. - Anticoagulation on hold due to presentation with hematuria, anemia requiring transfusion of 2 units PRBCs on admission, concern for possible urological procedure. - Urology to advise when anticoagulation may be resumed.  10.  Acute blood loss anemia/anemia of chronic disease -Noted to have presented with hypotension, hematuria with  hemoglobin dropping as low as 6.8 on presentation. - Status post Kcentra  on presentation to the ED and transfusion of 2 units PRBCs with hemoglobin currently at 11.2. - Repeat CBC this afternoon and in the AM. - Transfusion threshold hemoglobin < 7.  11.  Hypothyroidism -Resume home regimen Synthroid .    DVT prophylaxis: SCDs in anticipation of possible procedure per urology. Code Status: Full Family Communication: Updated patient, granddaughter at bedside.  Updated daughter on speaker phone. Disposition: CIR when medically stable and cleared by urology.  Status is: Inpatient Remains inpatient appropriate because: Severity of illness   Consultants:  Urology: Dr. Lovie 03/18/2024 Gastroenterology  Procedures:  CT abdomen and pelvis 03/18/2024 Chest x-ray 03/17/2024 Renal ultrasound 03/18/2024 2D echo 03/18/2024 Transfusion 2 units PRBC 03/18/2024  Antimicrobials:  Anti-infectives (From admission, onward)    Start     Dose/Rate Route Frequency Ordered Stop   03/20/24 0000  vancomycin  (VANCOREADY) IVPB 750 mg/150 mL  Status:  Discontinued        750 mg 150 mL/hr over 60 Minutes Intravenous Every 48 hours 03/18/24 0316 03/18/24 1407   03/18/24 2200  ceFEPIme  (MAXIPIME ) 2 g in sodium chloride  0.9 % 100 mL IVPB        2 g 200 mL/hr over  30 Minutes Intravenous Every 24 hours 03/18/24 0314     03/18/24 1000  vancomycin  (VANCOCIN ) capsule 125 mg        125 mg Oral 4 times daily 03/18/24 0308 03/28/24 0959   03/18/24 1000  metroNIDAZOLE  (FLAGYL ) IVPB 500 mg        500 mg 100 mL/hr over 60 Minutes Intravenous Every 12 hours 03/18/24 0314     03/18/24 0400  metroNIDAZOLE  (FLAGYL ) IVPB 500 mg  Status:  Discontinued        500 mg 100 mL/hr over 60 Minutes Intravenous  Once 03/18/24 0308 03/18/24 0314   03/18/24 0400  ceFEPIme  (MAXIPIME ) 2 g in sodium chloride  0.9 % 100 mL IVPB  Status:  Discontinued        2 g 200 mL/hr over 30 Minutes Intravenous  Once 03/18/24 0308 03/18/24 0314    03/17/24 2330  vancomycin  (VANCOCIN ) IVPB 1000 mg/200 mL premix        1,000 mg 200 mL/hr over 60 Minutes Intravenous  Once 03/17/24 2322 03/18/24 0238   03/17/24 2215  ceFEPIme  (MAXIPIME ) 2 g in sodium chloride  0.9 % 100 mL IVPB        2 g 200 mL/hr over 30 Minutes Intravenous  Once 03/17/24 2208 03/17/24 2250   03/17/24 2215  metroNIDAZOLE  (FLAGYL ) IVPB 500 mg        500 mg 100 mL/hr over 60 Minutes Intravenous  Once 03/17/24 2208 03/18/24 0005         Subjective: Patient laying in bed.  Denies any chest pain, no shortness of breath.  Still with some intermittent abdominal pain that has not changed.  Noted to have mushy stools x 2 overnight and none this morning.  Overall feeling better than she did on admission.  Daughter on speaker phone and requesting GI consultation during this hospitalization.  Granddaughter at bedside.  Objective: Vitals:   03/19/24 1300 03/19/24 1400 03/19/24 1500 03/19/24 1600  BP: 128/85 (!) 130/92    Pulse: 77 82 77 73  Resp: 17 18 18  (!) 28  Temp:      TempSrc:      SpO2: 99% 99% 100% 100%  Weight:      Height:        Intake/Output Summary (Last 24 hours) at 03/19/2024 1626 Last data filed at 03/19/2024 0609 Gross per 24 hour  Intake 360 ml  Output 1150 ml  Net -790 ml   Filed Weights   03/18/24 0229  Weight: 65.9 kg    Examination:  General exam: Appears calm and comfortable  Respiratory system: Clear to auscultation.  No wheezes, no crackles, no rhonchi.  Fair air movement.  Speaking in full sentences.  Cardiovascular system: Irregularly irregular.  No JVD, no murmurs rubs or gallops.  No pitting lower extremity edema.  . Gastrointestinal system: Abdomen is nondistended, soft and nontender. No organomegaly or masses felt. Normal bowel sounds heard. Central nervous system: Alert and oriented. No focal neurological deficits. Extremities: Symmetric 5 x 5 power. Skin: No rashes, lesions or ulcers Psychiatry: Judgement and insight appear  normal. Mood & affect appropriate.     Data Reviewed: I have personally reviewed following labs and imaging studies  CBC: Recent Labs  Lab 03/17/24 2206 03/18/24 1014 03/18/24 1821 03/19/24 0139 03/19/24 0726  WBC 10.3 11.7*  --  6.4  --   NEUTROABS 8.8* 10.3*  --   --   --   HGB 6.8* 12.2 12.4 10.8* 11.2*  HCT 23.2* 38.3 39.7 35.0*  35.7*  MCV 92.1 87.2  --  89.5  --   PLT 158 222  --  175  --     Basic Metabolic Panel: Recent Labs  Lab 03/17/24 2206 03/17/24 2301 03/18/24 1014 03/19/24 0139  NA 132*  --  134* 133*  K 2.9*  --  4.4 4.7  CL 108  --  109 109  CO2 15*  --  20* 18*  GLUCOSE 105*  --  115* 116*  BUN 37*  --  34* 29*  CREATININE 2.59*  --  2.24* 1.97*  CALCIUM  7.6*  --  9.4 9.0  MG  --  1.5* 3.5* 2.6*  PHOS  --   --   --  2.4*    GFR: Estimated Creatinine Clearance: 22 mL/min (A) (by C-G formula based on SCr of 1.97 mg/dL (H)).  Liver Function Tests: Recent Labs  Lab 03/17/24 2206 03/19/24 0139  AST 14* 15  ALT 8 16  ALKPHOS 48 57  BILITOT 0.9 1.1  PROT 5.0* 5.1*  ALBUMIN 2.5* 2.4*    CBG: Recent Labs  Lab 03/18/24 1940 03/18/24 2348 03/19/24 0321 03/19/24 0735 03/19/24 1141  GLUCAP 116* 96 99 105* 93     Recent Results (from the past 240 hours)  Blood Culture (routine x 2)     Status: None (Preliminary result)   Collection Time: 03/17/24 10:06 PM   Specimen: BLOOD  Result Value Ref Range Status   Specimen Description   Final    BLOOD BLOOD RIGHT ARM Performed at [facility], [address] [place], [place] [address]    Special Requests   Final    BOTTLES DRAWN AEROBIC ONLY Blood Culture results may not be optimal due to an inadequate volume of blood received in culture bottles Performed at [facility], [address] [place], [place] [address]    Culture   Final    NO GROWTH 1 DAY Performed at [facility], [address], [place], [place] [address]    Report Status  PENDING  Incomplete  Resp panel by RT-PCR (RSV, Flu A&B, Covid) Anterior Nasal Swab     Status: None   Collection Time: 03/17/24 11:08 PM   Specimen: Anterior Nasal Swab  Result Value Ref Range Status   SARS Coronavirus 2 by RT PCR NEGATIVE NEGATIVE Final    Comment: (NOTE) SARS-CoV-2 target nucleic acids are NOT DETECTED.  The SARS-CoV-2 RNA is generally detectable in upper respiratory specimens during the acute phase of infection. The lowest concentration of SARS-CoV-2 viral copies this assay can detect is 138 copies/mL. A negative result does not preclude SARS-Cov-2 infection and should not be used as the sole basis for treatment or other patient management decisions. A negative result may occur with  improper specimen collection/handling, submission of specimen other than nasopharyngeal swab, presence of viral mutation(s) within the areas targeted by this assay, and inadequate number of viral copies(<138 copies/mL). A negative result must be combined with clinical observations, patient history, and epidemiological information. The expected result is Negative.  Fact Sheet for Patients:  BloggerCourse.com  Fact Sheet for Healthcare Providers:  SeriousBroker.it  This test is no t yet approved or cleared by the [place]  FDA and  has been authorized for detection and/or diagnosis of SARS-CoV-2 by FDA under an Emergency Use Authorization (EUA). This EUA will remain  in effect (meaning this test can be used) for the duration of the COVID-19 declaration under Section 564(b)(1) of the  Act, 21 U.S.C.section 360bbb-3(b)(1), unless the authorization is terminated  or revoked sooner.       Influenza A by PCR NEGATIVE NEGATIVE Final   Influenza B by PCR NEGATIVE NEGATIVE Final    Comment: (NOTE) The Xpert Xpress SARS-CoV-2/FLU/RSV plus assay is intended as an aid in the diagnosis of influenza from Nasopharyngeal swab specimens  and should not be used as a sole basis for treatment. Nasal washings and aspirates are unacceptable for Xpert Xpress SARS-CoV-2/FLU/RSV testing.  Fact Sheet for Patients: BloggerCourse.com  Fact Sheet for Healthcare Providers: SeriousBroker.it  This test is not yet approved or cleared by the [place]  FDA and has been authorized for detection and/or diagnosis of SARS-CoV-2 by FDA under an Emergency Use Authorization (EUA). This EUA will remain in effect (meaning this test can be used) for the duration of the COVID-19 declaration under Section 564(b)(1) of the Act, 21 U.S.C. section 360bbb-3(b)(1), unless the authorization is terminated or revoked.     Resp Syncytial Virus by PCR NEGATIVE NEGATIVE Final    Comment: (NOTE) Fact Sheet for Patients: BloggerCourse.com  Fact Sheet for Healthcare Providers: SeriousBroker.it  This test is not yet approved or cleared by the [place]  FDA and has been authorized for detection and/or diagnosis of SARS-CoV-2 by FDA under an Emergency Use Authorization (EUA). This EUA will remain in effect (meaning this test can be used) for the duration of the COVID-19 declaration under Section 564(b)(1) of the Act, 21 U.S.C. section 360bbb-3(b)(1), unless the authorization is terminated or revoked.  Performed at [facility], [address], [place], [place] [address]   Blood Culture (routine x 2)     Status: None (Preliminary result)   Collection Time: 03/17/24 11:35 PM   Specimen: BLOOD LEFT HAND  Result Value Ref Range Status   Specimen Description   Final    BLOOD LEFT HAND Performed at [facility], [address] [place], [place] [address]    Special Requests   Final    BOTTLES DRAWN AEROBIC ONLY Blood Culture results may not be optimal due to an inadequate volume of blood received in culture  bottles Performed at [facility], [address], [place], [place] [address]    Culture   Final    NO GROWTH 1 DAY Performed at [facility], [address], [place], [place] [address]    Report Status PENDING  Incomplete  C Difficile Quick Screen w PCR reflex     Status: Abnormal   Collection Time: 03/18/24  1:44 AM   Specimen: STOOL  Result Value Ref Range Status   C Diff antigen POSITIVE (A) NEGATIVE Final   C Diff toxin NEGATIVE NEGATIVE Final   C Diff interpretation Results are indeterminate. See PCR results.  Final    Comment: Performed at [facility], [address], [place], [place] [address]  Gastrointestinal Panel by PCR , Stool     Status: None   Collection Time: 03/18/24  1:44 AM   Specimen: Stool  Result Value Ref Range Status   Campylobacter species NOT DETECTED NOT DETECTED Final   Plesimonas shigelloides NOT DETECTED NOT DETECTED Final   Salmonella species NOT DETECTED NOT DETECTED Final   Yersinia enterocolitica NOT DETECTED NOT DETECTED Final   Vibrio species NOT DETECTED NOT DETECTED Final   Vibrio cholerae NOT DETECTED NOT DETECTED Final   Enteroaggregative E coli (EAEC) NOT DETECTED NOT DETECTED Final   Enteropathogenic E coli (EPEC) NOT DETECTED  NOT DETECTED Final   Enterotoxigenic E coli (ETEC) NOT DETECTED NOT DETECTED Final   Shiga like toxin producing E coli (STEC) NOT DETECTED NOT DETECTED Final   Shigella/Enteroinvasive E coli (EIEC) NOT DETECTED NOT DETECTED Final   Cryptosporidium NOT DETECTED NOT DETECTED Final   Cyclospora cayetanensis NOT DETECTED NOT DETECTED Final   Entamoeba histolytica NOT DETECTED NOT DETECTED Final   Giardia lamblia NOT DETECTED NOT DETECTED Final   Adenovirus F40/41 NOT DETECTED NOT DETECTED Final   Astrovirus NOT DETECTED NOT DETECTED Final   Norovirus GI/GII NOT DETECTED NOT DETECTED Final   Rotavirus A NOT DETECTED NOT DETECTED Final   Sapovirus (I, II, IV, and V) NOT  DETECTED NOT DETECTED Final    Comment: Performed at [facility], [address] [place], [place] [address]  C. Diff by PCR, Reflexed     Status: None   Collection Time: 03/18/24  1:44 AM  Result Value Ref Range Status   Toxigenic C. Difficile by PCR NEGATIVE NEGATIVE Final    Comment: Patient is colonized with non toxigenic C. difficile. May not need treatment unless significant symptoms are present. Performed at [facility], [address] [place], [place] [address]   MRSA Next Gen by PCR, Nasal     Status: None   Collection Time: 03/18/24  3:45 AM   Specimen: Nasal Mucosa; Nasal Swab  Result Value Ref Range Status   MRSA by PCR Next Gen NOT DETECTED NOT DETECTED Final    Comment: (NOTE) The GeneXpert MRSA Assay (FDA approved for NASAL specimens only), is one component of a comprehensive MRSA colonization surveillance program. It is not intended to diagnose MRSA infection nor to guide or monitor treatment for MRSA infections. Test performance is not FDA approved in patients less than 26 years old. Performed at [facility], [address], [place], [place] [address]          Radiology Studies: ECHOCARDIOGRAM COMPLETE Result Date: 03/18/2024    ECHOCARDIOGRAM REPORT   Patient Name:   [name] Date of Exam: 03/18/2024 Medical Rec #:  [id number]       Height:       66.0 in Accession #:    [id number]      Weight:       145.3 lb Date of Birth:  09/17/1945       BSA:          1.746 m Patient Age:    77 years        BP:           99/60 mmHg Patient Gender: F               HR:           67 bpm. Exam Location:  Inpatient Procedure: 2D Echo, Cardiac Doppler and Color Doppler (Both Spectral and Color            Flow Doppler were utilized during procedure). Indications:    Shock  History:        Patient has prior history of Echocardiogram examinations, most                 recent 02/10/2019. Abnormal ECG, Arrythmias:Atrial Fibrillation,                  Signs/Symptoms:Shortness of Breath and Dyspnea; Risk                 Factors:Hypertension, Dyslipidemia and Former Smoker. Lung  cancer.  Sonographer:    Ellouise Mose RDCS Referring Phys: [id number] JESSICA MARSHALL IMPRESSIONS  1. Left ventricular ejection fraction, by estimation, is 60 to 65%. The left ventricle has normal function. The left ventricle has no regional wall motion abnormalities. Left ventricular diastolic parameters are indeterminate.  2. Right ventricular systolic function is normal. The right ventricular size is mildly enlarged. There is moderately elevated pulmonary artery systolic pressure. The estimated right ventricular systolic pressure is 49.3 mmHg.  3. Left atrial size was severely dilated.  4. Right atrial size was severely dilated.  5. The mitral valve is normal in structure. Mild mitral valve regurgitation. No evidence of mitral stenosis.  6. Tricuspid valve regurgitation is mild to moderate.  7. The aortic valve is tricuspid. Aortic valve regurgitation is trivial. Aortic valve sclerosis is present, with no evidence of aortic valve stenosis.  8. The inferior vena cava is dilated in size with <50% respiratory variability, suggesting right atrial pressure of 15 mmHg. Comparison(s): Prior images reviewed side by side. Right ventricular and bi-atrial dilation from prior study report. Unable to load 2020 images. FINDINGS  Left Ventricle: Left ventricular ejection fraction, by estimation, is 60 to 65%. The left ventricle has normal function. The left ventricle has no regional wall motion abnormalities. The left ventricular internal cavity size was normal in size. There is  no left ventricular hypertrophy. Left ventricular diastolic parameters are indeterminate. Right Ventricle: The right ventricular size is mildly enlarged. No increase in right ventricular wall thickness. Right ventricular systolic function is normal. There is moderately elevated pulmonary artery systolic pressure.  The tricuspid regurgitant velocity is 2.93 m/s, and with an assumed right atrial pressure of 15 mmHg, the estimated right ventricular systolic pressure is 49.3 mmHg. Left Atrium: Left atrial size was severely dilated. Right Atrium: Right atrial size was severely dilated. Pericardium: There is no evidence of pericardial effusion. Mitral Valve: The mitral valve is normal in structure. Mild mitral valve regurgitation. No evidence of mitral valve stenosis. MV peak gradient, 7.0 mmHg. The mean mitral valve gradient is 3.0 mmHg. Tricuspid Valve: The tricuspid valve is normal in structure. Tricuspid valve regurgitation is mild to moderate. No evidence of tricuspid stenosis. Aortic Valve: The aortic valve is tricuspid. Aortic valve regurgitation is trivial. Aortic valve sclerosis is present, with no evidence of aortic valve stenosis. Pulmonic Valve: The pulmonic valve was normal in structure. Pulmonic valve regurgitation is mild. No evidence of pulmonic stenosis. Aorta: The aortic root and ascending aorta are structurally normal, with no evidence of dilitation. Venous: The inferior vena cava is dilated in size with less than 50% respiratory variability, suggesting right atrial pressure of 15 mmHg. IAS/Shunts: No atrial level shunt detected by color flow Doppler.  LEFT VENTRICLE PLAX 2D LVIDd:         3.60 cm LVIDs:         2.50 cm LV PW:         1.10 cm LV IVS:        1.20 cm LVOT diam:     2.40 cm LV SV:         57 LV SV Index:   32 LVOT Area:     4.52 cm  LV Volumes (MOD) LV vol d, MOD A2C: 56.8 ml LV vol d, MOD A4C: 38.2 ml LV vol s, MOD A2C: 14.8 ml LV vol s, MOD A4C: 11.2 ml LV SV MOD A2C:     42.0 ml LV SV MOD A4C:     38.2 ml  LV SV MOD BP:      35.5 ml RIGHT VENTRICLE            IVC RV S prime:     6.74 cm/s  IVC diam: 2.80 cm TAPSE (M-mode): 1.4 cm LEFT ATRIUM              Index        RIGHT ATRIUM           Index LA diam:        4.10 cm  2.35 cm/m   RA Area:     37.20 cm LA Vol (A2C):   87.5 ml  50.12 ml/m  RA  Volume:   154.00 ml 88.21 ml/m LA Vol (A4C):   101.0 ml 57.85 ml/m LA Biplane Vol: 94.5 ml  54.13 ml/m  AORTIC VALVE             PULMONIC VALVE LVOT Vmax:   69.00 cm/s  PR End Diast Vel: 1.98 msec LVOT Vmean:  44.700 cm/s LVOT VTI:    0.125 m  AORTA Ao Root diam: 3.40 cm Ao Asc diam:  3.70 cm MITRAL VALVE                TRICUSPID VALVE MV Area (PHT): 4.15 cm     TR Peak grad:   34.3 mmHg MV Area VTI:   2.74 cm     TR Vmax:        293.00 cm/s MV Peak grad:  7.0 mmHg MV Mean grad:  3.0 mmHg     SHUNTS MV Vmax:       1.32 m/s     Systemic VTI:  0.12 m MV Vmean:      74.5 cm/s    Systemic Diam: 2.40 cm MV Decel Time: 183 msec MV E velocity: 124.00 cm/s Stanly Leavens MD Electronically signed by Stanly Leavens MD Signature Date/Time: 03/18/2024/11:03:50 AM    Final    US  RENAL Result Date: 03/18/2024 CLINICAL DATA:  Acute kidney injury EXAM: RENAL / URINARY TRACT ULTRASOUND COMPLETE COMPARISON:  CT 03/18/2024 FINDINGS: Right Kidney: Absent Left Kidney: Renal measurements: 13.9 x 6.1 x 4.7 cm = volume: 209 mL. Renal cortical thickness is preserved cortical echogenicity is within normal limits. Mild to moderate hydronephrosis is present with distension of the renal pelvis. No intrarenal masses or calcifications are seen. Bladder: Asymmetric bladder wall thickening is seen posterosuperiorly. An infiltrative mural mass is not excluded. Other: None. IMPRESSION: 1. Mild to moderate left hydronephrosis. 2. Asymmetric bladder wall thickening posterosuperiorly. An infiltrative mural mass is not excluded. Correlation with cystoscopy may be helpful for further evaluation. Electronically Signed   By: Dorethia Molt M.D.   On: 03/18/2024 02:35   CT ABDOMEN PELVIS WO CONTRAST Result Date: 03/18/2024 CLINICAL DATA:  Lower abdominal pain EXAM: CT ABDOMEN AND PELVIS WITHOUT CONTRAST TECHNIQUE: Multidetector CT imaging of the abdomen and pelvis was performed following the standard protocol without IV contrast.  RADIATION DOSE REDUCTION: This exam was performed according to the departmental dose-optimization program which includes automated exposure control, adjustment of the mA and/or kV according to patient size and/or use of iterative reconstruction technique. COMPARISON:  01/02/2024, 05/02/2011 FINDINGS: Lower chest: Mild cardiomegaly. Moderate hiatal hernia. No acute abnormality. 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 This mass has decreased in size since remote prior examination 05/02/2011 and is indeterminate but likely benign, possibly the residua of treated disease the  sequela prior trauma or inflammation. The liver is otherwise un Pancreas: Unremarkable Spleen: Unremarkable Adrenals/Urinary Tract: The adrenal glands are unremarkable. 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 Mild left hydronephrosis has developed to the level the bladder trigone, however, distal obstructing mass or calculus is not clearly identified. No intrarenal or ureteral calculi. Bladder demonstrates asymmetric wall thickening, better appreciated than on prior examination indent underlying infiltrative mural mass is not excluded. Stomach/Bowel: Stomach is within normal limits. Appendix is not clearly identified, however, no secondary signs of appendicitis within the right lower quadrant. No evidence of bowel wall thickening, distention, or inflammatory changes. Vascular/Lymphatic: Aortic atherosclerosis. No enlarged abdominal or pelvic lymph nodes. Reproductive: Prostate is unremarkable. Other: No abdominal wall hernia or abnormality. No abdominopelvic ascites. 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 L3-4 lumbar fusion with instrumentation and L3 posterior decompression has been performed. No acute bone abnormality. No lytic or blastic bone  lesion. IMPRESSION: 1. Interval development of mild left hydronephrosis to the level of the bladder trigone. No distal obstructing mass or calculus is identified. There is, however, asymmetric bladder wall thickening and this may result in obstruction at the ureterovesicular junction. Correlation with cystoscopy the presence of an underlying mass may be helpful for further evaluation. 2. Moderate hiatal hernia. 3. 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 4. Status post cholecystectomy. 5. Absent right nephrectomy. 6. Status post L3-4 fusion and posterior decompression. Aortic Atherosclerosis (ICD10-I70.0). Electronically Signed   By: Dorethia Molt M.D.   On: 03/18/2024 02:32   DG Chest 2 View Result Date: 03/17/2024 CLINICAL DATA:  Altered mental status. Increasing weakness, multiple falls, and confusion. Abdominal pain. EXAM: CHEST - 2 VIEW COMPARISON:  01/30/2024 FINDINGS: Mild cardiac enlargement. Volume loss in the right lung with right mid lung opacity. Opacity appears more dense than on the prior study. This may represent progressing area of atelectasis, pneumonia, or could indicate a mass lesion or postoperative change. See previous CT evaluation from 12/21/2023. Left lung is clear. No pleural effusion or pneumothorax. Mediastinal contours appear intact. Moderate-sized esophageal hiatal hernia behind the heart. Calcification of the aorta. Degenerative changes in the spine. IMPRESSION: 1. Cardiac enlargement. 2. 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 This appears mildly increased since prior chest radiograph. This was evaluated at previous CT 12/21/2023. Electronically Signed   By: Elsie Gravely M.D.   On: 03/17/2024 22:32        Scheduled Meds:  bisoprolol   2.5 mg Oral Daily   buPROPion   75 mg Oral q morning    Chlorhexidine  Gluconate Cloth  6 each Topical Daily   cholecalciferol   1,000 Units Oral Daily   colestipol   1 g Oral BID   FLUoxetine   30 mg Oral QHS   gabapentin   600 mg Oral BID   insulin  aspart  0-15 Units Subcutaneous Q6H   levothyroxine   88 mcg Oral Q0600   pantoprazole  (PROTONIX ) IV  40 mg Intravenous Q12H   phosphorus  250 mg Oral BID   psyllium  1 packet Oral Daily   saccharomyces boulardii  250 mg Oral BID   vancomycin   125 mg Oral QID   Continuous Infusions:  sodium chloride  100 mL/hr at 03/19/24 1109   ceFEPime  (MAXIPIME ) IV Stopped (03/18/24 2250)   metronidazole  Stopped (03/19/24 1142)     LOS:  1 day    Time spent: 45 minutes    Toribio Hummer, MD [facility]   To contact the attending provider between 7A-7P or the covering provider during after hours 7P-7A, please log into the web site [email or web address] and access using universal [employer] password for that web site. If you do not have the password, please call the hospital operator.  03/19/2024, 4:26 PM

## 2024-03-19 NOTE — Progress Notes (Signed)
 Patient ID: [name], female   DOB: [birth year], 78 y.o.   MRN: [id number]  Cr improved today and hydronephrosis has been chronic.  Ok to continue to observe.  Ok for patient to eat.  I discussed with Dr. Selma and I will check on her tomorrow.

## 2024-03-19 NOTE — Plan of Care (Signed)
  Problem: Coping: Goal: Ability to adjust to condition or change in health will improve Outcome: Progressing   Problem: Health Behavior/Discharge Planning: Goal: Ability to manage health-related needs will improve Outcome: Progressing   Problem: Metabolic: Goal: Ability to maintain appropriate glucose levels will improve Outcome: Progressing   Problem: Nutritional: Goal: Maintenance of adequate nutrition will improve Outcome: Progressing   Problem: Skin Integrity: Goal: Risk for impaired skin integrity will decrease Outcome: Progressing   Problem: Tissue Perfusion: Goal: Adequacy of tissue perfusion will improve Outcome: Progressing   Problem: Education: Goal: Knowledge of General Education information will improve Description: Including pain rating scale, medication(s)/side effects and non-pharmacologic comfort measures Outcome: Progressing   Problem: Clinical Measurements: Goal: Respiratory complications will improve Outcome: Progressing

## 2024-03-19 NOTE — Consult Note (Addendum)
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 EXAM: CT ABDOMEN AND PELVIS WITHOUT CONTRAST TECHNIQUE: Multidetector CT imaging of the abdomen and pelvis was performed following the standard protocol without IV contrast. RADIATION DOSE REDUCTION: This exam was performed according to the departmental dose-optimization program which includes automated exposure control, adjustment of the mA and/or kV according to patient size and/or use of iterative reconstruction technique. COMPARISON:  01/02/2024, 05/02/2011 FINDINGS: Lower chest: Mild cardiomegaly. Moderate hiatal hernia. No acute abnormality. 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 This mass has decreased in size since remote prior examination 05/02/2011 and is indeterminate but likely benign, possibly the residua of treated disease the sequela prior trauma or inflammation. The liver is otherwise un Pancreas: Unremarkable Spleen: Unremarkable Adrenals/Urinary Tract: The adrenal glands are unremarkable. The right kidney is  absent. There is compensatory hypertrophy of the residual left kidney. Mild left  hydronephrosis has developed to the level the bladder trigone, however, distal obstructing mass or calculus is not clearly identified. No intrarenal or ureteral calculi. Bladder demonstrates asymmetric wall thickening, better appreciated than on prior examination indent underlying infiltrative mural mass is not excluded. Stomach/Bowel: Stomach is within normal limits. Appendix is not clearly identified, however, no secondary signs of appendicitis within the right lower quadrant. No evidence of bowel wall thickening, distention, or inflammatory changes. Vascular/Lymphatic: Aortic atherosclerosis. No enlarged abdominal or pelvic lymph nodes. Reproductive: Prostate is unremarkable. Other: No abdominal wall hernia or abnormality. No abdominopelvic ascites. 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 L3-4 lumbar fusion with instrumentation and L3 posterior decompression has been performed. No acute bone abnormality. No lytic or blastic bone lesion. IMPRESSION: 1. Interval development of mild left hydronephrosis to the level of the bladder trigone. No distal obstructing mass or calculus is identified. There is, however, asymmetric bladder wall thickening and this may result in obstruction at the ureterovesicular junction. Correlation with cystoscopy the presence of an underlying mass may be helpful for further evaluation. 2. Moderate hiatal hernia. 3. 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 4. Status post cholecystectomy. 5. Absent right nephrectomy. 6. Status post L3-4 fusion and posterior decompression. Aortic Atherosclerosis (ICD10-I70.0). Electronically Signed   By: Dorethia Molt M.D.   On: 03/18/2024 02:32   DG Chest 2 View Result Date: 03/17/2024 CLINICAL DATA:  Altered mental status. Increasing weakness, multiple falls, and confusion. Abdominal pain.  EXAM: CHEST - 2 VIEW COMPARISON:  01/30/2024 FINDINGS: Mild cardiac enlargement. Volume loss in the right lung with right mid lung opacity. Opacity appears more dense than on the prior study. This may represent progressing area of atelectasis, pneumonia, or could indicate a mass lesion or postoperative change. See previous CT evaluation from 12/21/2023. Left lung is clear. No pleural effusion or pneumothorax. Mediastinal contours appear intact. Moderate-sized esophageal hiatal hernia behind the heart. Calcification of the aorta. Degenerative changes in the spine. IMPRESSION: 1. Cardiac enlargement. 2. 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 This appears mildly increased since prior chest radiograph. This was evaluated at previous CT 12/21/2023. Electronically Signed   By: Elsie Gravely M.D.   On: 03/17/2024 22:32     Impression / Plan:   Impression: 1.  Acute on chronic diarrhea: Previous history of C. difficile in April, now C. difficile antigen positive, toxin/PCR negative, GI path panel negative here in the hospital, patient complains of crampy abdominal pain maybe 3 to 4 days a week that resulted in 2-3 looser stools than normal and nausea, treated with Levsin  and Zofran , seems somewhat worse since C. 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  Hematuria and left hydronephrosis: Intermittent, worse of late, exacerbated by Eliquis , underwent cystoscopy a year ago, now ultrasound showing thickened bladder and recommending repeat cystoscopy possibly as outpatient 3.  AKI on CKD stage III: Status post right nephrectomy, remote 4.  Multiple electrolyte abnormality: Hyponatremia, hypokalemia, hypomagnesemia-improved 5.  A-fib: On Eliquis , held at present in setting of hemoglobin drop/hematuria 6.  Anemia: Likely acute blood loss anemia  versus component of ACD, received Kcentra  on ED presentation and changes PRBCs so far, hemoglobin  stable today 7.  History of lung cancer: Status post radiation, recent follow-up with oncology and PET scan showing likely inflammatory changes from radiation  Plan: 1.  Patient thankfully already has follow-up in our clinic at the end of August. 2.  Discussed with the patient that this sounds like it could be either postinfectious IBS +/- bile salt induced diarrhea +/- SIBO.  We discussed treatment for all 3 of these. 3.  At this point starting a fiber supplement daily, continuing on her Florastor twice daily and will start Colestipol  1 G BID to see if it helps with diarrhea 4.  In the future if Colestipol  is not helping could consider scheduled antispasmodic such as Levsin  or Dicyclomine  maybe once or twice a day 5.  If above not found to be helpful then could consider testing for SIBO versus empiric treatment with Xifaxan  Thank you for your kind consultation, we will likely sign off, but please call us  back if we can be of any further assistance during her hospitalization.  Delon Gibson [facility]  03/19/2024, 11:36 AM

## 2024-03-19 NOTE — Evaluation (Signed)
 Physical Therapy Evaluation Patient Details Name: [name] MRN: [id number] DOB: 1946-02-18 Today's Date: 03/19/2024  History of Present Illness  Pt is a 78 y/o F admitted on 03/17/24 after presenting with c/o AMS with frequent falling. Pt is being treated for acute hypotension, multifactorial in origin. PMH: lung CA, recurrent C. Diff colitis, a-fib on eliquis , CVA, CKD 3A  Clinical Impression  Pt seen for PT evaluation with pt agreeable to tx, spouse present in room. Prior to admission pt was ambulatory with rollator without assistance from family until she experienced this recent decline where she's has ~4 falls since Sunday, requiring assistance for all mobility. Pt does endorse hx of bad knees that turn inward. On this date, pt requires min assist to transition to sitting EOB with HOB elevated & bed rails, mod assist for sit>stand. Pt stood <5 seconds before reporting need to use restroom. No BSC in room so pt assisted back to bed & on bedpan. Will continue to follow pt acutely to progress mobility as able to reduce caregiver burden & fall risk.        If plan is discharge home, recommend the following: A lot of help with walking and/or transfers;A lot of help with bathing/dressing/bathroom;Assist for transportation;Assistance with cooking/housework;Help with stairs or ramp for entrance   Can travel by private vehicle        Equipment Recommendations None recommended by PT  Recommendations for Other Services    OT consult   Functional Status Assessment Patient has had a recent decline in their functional status and demonstrates the ability to make significant improvements in function in a reasonable and predictable amount of time.     Precautions / Restrictions Precautions Precautions: Fall Restrictions Weight Bearing Restrictions Per Provider Order: No      Mobility  Bed Mobility Overal bed mobility: Needs Assistance Bed Mobility: Supine to Sit     Supine to sit: Min  assist, HOB elevated, Used rails (exit R side of bed, cuing to use bed rails, assistance to upright trunk)          Transfers Overall transfer level: Needs assistance Equipment used:  (holding to stabilized chair positioned in front of her) Transfers: Sit to/from Stand Sit to Stand: Mod assist           General transfer comment: sit>stand with mod assist, pulling to stand    Ambulation/Gait                  Stairs            Wheelchair Mobility     Tilt Bed    Modified Rankin (Stroke Patients Only)       Balance Overall balance assessment: Needs assistance Sitting-balance support: Feet supported Sitting balance-Leahy Scale: Fair Sitting balance - Comments: close supervision<>CGA static sitting EOB   Standing balance support: Bilateral upper extremity supported, Reliant on assistive device for balance Standing balance-Leahy Scale: Poor                               Pertinent Vitals/Pain Pain Assessment Pain Assessment: Faces Faces Pain Scale: Hurts little more Pain Location: BLE Pain Descriptors / Indicators: Grimacing, Guarding, Discomfort Pain Intervention(s): Monitored during session, Limited activity within patient's tolerance    Home Living Family/patient expects to be discharged to:: Private residence Living Arrangements: Spouse/significant other Available Help at Discharge: Family Type of Home:  (condo) Home Access: Stairs to enter Entrance Stairs-Rails: Left  Entrance Stairs-Number of Steps: 4   Home Layout: One level Home Equipment: Rollator (4 wheels);Shower seat;BSC/3in1;Wheelchair - manual;Grab bars - tub/shower;Cane - single Librarian, academic (2 wheels);Transport chair      Prior Function               Mobility Comments: Prior to this current issue, pt was ambulatory with rollator without assistance, has experienced several falls since Sunday.       Extremity/Trunk Assessment   Upper Extremity  Assessment Upper Extremity Assessment: Generalized weakness    Lower Extremity Assessment Lower Extremity Assessment: Generalized weakness (BLE genu valgum in standing, pt reports this has been ongoing)       Communication   Communication Communication: No apparent difficulties    Cognition Arousal: Alert Behavior During Therapy: WFL for tasks assessed/performed   PT - Cognitive impairments: No apparent impairments                         Following commands: Intact       Cueing Cueing Techniques: Verbal cues     General Comments General comments (skin integrity, edema, etc.): BP in LUE, supine: 123/69 (85), sitting EOB: 127/90 (100) -- pt with c/o lightheadedness sitting EOB    Exercises     Assessment/Plan    PT Assessment Patient needs continued PT services  PT Problem List Decreased strength;Pain;Decreased knowledge of use of DME;Decreased activity tolerance;Decreased balance;Decreased safety awareness;Decreased mobility       PT Treatment Interventions Balance training;Gait training;DME instruction;Stair training;Neuromuscular re-education;Functional mobility training;Therapeutic activities;Therapeutic exercise;Patient/family education    PT Goals (Current goals can be found in the Care Plan section)  Acute Rehab PT Goals Patient Stated Goal: get better PT Goal Formulation: With patient Time For Goal Achievement: 04/02/24 Potential to Achieve Goals: Good    Frequency Min 2X/week     Co-evaluation               AM-PAC PT 6 Clicks Mobility  Outcome Measure Help needed turning from your back to your side while in a flat bed without using bedrails?: A Little Help needed moving from lying on your back to sitting on the side of a flat bed without using bedrails?: A Lot Help needed moving to and from a bed to a chair (including a wheelchair)?: Total Help needed standing up from a chair using your arms (e.g., wheelchair or bedside chair)?: A  Lot Help needed to walk in hospital room?: Total Help needed climbing 3-5 steps with a railing? : Total 6 Click Score: 10    End of Session   Activity Tolerance:  (limited 2/2 need to toilet) Patient left: in bed;with call bell/phone within reach;with nursing/sitter in room;with family/visitor present (on bed pan) Nurse Communication: Mobility status PT Visit Diagnosis: Unsteadiness on feet (R26.81);Muscle weakness (generalized) (M62.81);History of falling (Z91.81);Other abnormalities of gait and mobility (R26.89);Difficulty in walking, not elsewhere classified (R26.2);Pain Pain - Right/Left:  (bilateral) Pain - part of body: Leg    Time: 1349-1405 PT Time Calculation (min) (ACUTE ONLY): 16 min   Charges:   PT Evaluation $PT Eval Low Complexity: 1 Low   PT General Charges $$ ACUTE PT VISIT: 1 Visit         Richerd Pinal, PT, DPT 03/19/24, 2:32 PM   Richerd CHRISTELLA Pinal 03/19/2024, 2:30 PM

## 2024-03-19 NOTE — Progress Notes (Signed)
  Inpatient Rehab Admissions Coordinator :  Per therapy recommendations, patient was screened for CIR candidacy by Ottie Glazier RN MSN.  At this time patient appears to be a potential candidate for CIR. I will place a rehab consult per protocol for full assessment. Please call me with any questions.  Ottie Glazier RN MSN Admissions Coordinator [phone]

## 2024-03-20 ENCOUNTER — Ambulatory Visit: Admitting: Gastroenterology

## 2024-03-20 DIAGNOSIS — N3001 Acute cystitis with hematuria: Secondary | ICD-10-CM | POA: Diagnosis not present

## 2024-03-20 DIAGNOSIS — R579 Shock, unspecified: Secondary | ICD-10-CM | POA: Diagnosis not present

## 2024-03-20 DIAGNOSIS — D62 Acute posthemorrhagic anemia: Secondary | ICD-10-CM | POA: Diagnosis not present

## 2024-03-20 DIAGNOSIS — G9341 Metabolic encephalopathy: Secondary | ICD-10-CM | POA: Diagnosis not present

## 2024-03-20 LAB — RENAL FUNCTION PANEL
Albumin: 2.4 g/dL — ABNORMAL LOW (ref 3.5–5.0)
Anion gap: 9 (ref 5–15)
BUN: 23 mg/dL (ref 8–23)
CO2: 17 mmol/L — ABNORMAL LOW (ref 22–32)
Calcium: 9.1 mg/dL (ref 8.9–10.3)
Chloride: 108 mmol/L (ref 98–111)
Creatinine, Ser: 1.82 mg/dL — ABNORMAL HIGH (ref 0.44–1.00)
GFR, Estimated: 28 mL/min — ABNORMAL LOW (ref >60)
Glucose, Bld: 99 mg/dL (ref 70–99)
Phosphorus: 2.7 mg/dL (ref 2.5–4.6)
Potassium: 4 mmol/L (ref 3.5–5.1)
Sodium: 134 mmol/L — ABNORMAL LOW (ref 135–145)

## 2024-03-20 LAB — CBC WITH DIFFERENTIAL/PLATELET
Abs Immature Granulocytes: 0.03 K/uL (ref 0.00–0.07)
Basophils Absolute: 0 K/uL (ref 0.0–0.1)
Basophils Relative: 0 %
Eosinophils Absolute: 0.1 K/uL (ref 0.0–0.5)
Eosinophils Relative: 1 %
HCT: 35 % — ABNORMAL LOW (ref 36.0–46.0)
Hemoglobin: 11 g/dL — ABNORMAL LOW (ref 12.0–15.0)
Immature Granulocytes: 1 %
Lymphocytes Relative: 16 %
Lymphs Abs: 0.8 K/uL (ref 0.7–4.0)
MCH: 28.1 pg (ref 26.0–34.0)
MCHC: 31.4 g/dL (ref 30.0–36.0)
MCV: 89.5 fL (ref 80.0–100.0)
Monocytes Absolute: 0.8 K/uL (ref 0.1–1.0)
Monocytes Relative: 15 %
Neutro Abs: 3.5 K/uL (ref 1.7–7.7)
Neutrophils Relative %: 67 %
Platelets: 195 K/uL (ref 150–400)
RBC: 3.91 MIL/uL (ref 3.87–5.11)
RDW: 14.8 % (ref 11.5–15.5)
WBC: 5.2 K/uL (ref 4.0–10.5)
nRBC: 0 % (ref 0.0–0.2)

## 2024-03-20 LAB — GLUCOSE, CAPILLARY
Glucose-Capillary: 117 mg/dL — ABNORMAL HIGH (ref 70–99)
Glucose-Capillary: 95 mg/dL (ref 70–99)
Glucose-Capillary: 97 mg/dL (ref 70–99)
Glucose-Capillary: 98 mg/dL (ref 70–99)

## 2024-03-20 LAB — MAGNESIUM: Magnesium: 2 mg/dL (ref 1.7–2.4)

## 2024-03-20 MED ORDER — CEFAZOLIN SODIUM-DEXTROSE 1-4 GM/50ML-% IV SOLN
1.0000 g | Freq: Two times a day (BID) | INTRAVENOUS | Status: DC
  Administered 2024-03-20 – 2024-03-21 (×3): 1 g via INTRAVENOUS
  Filled 2024-03-20 (×4): qty 50

## 2024-03-20 MED ORDER — PANTOPRAZOLE SODIUM 40 MG PO TBEC
40.0000 mg | DELAYED_RELEASE_TABLET | Freq: Two times a day (BID) | ORAL | Status: DC
  Administered 2024-03-20 – 2024-03-28 (×16): 40 mg via ORAL
  Filled 2024-03-20 (×17): qty 1

## 2024-03-20 MED ORDER — SODIUM BICARBONATE 650 MG PO TABS
650.0000 mg | ORAL_TABLET | Freq: Two times a day (BID) | ORAL | Status: AC
  Administered 2024-03-20 – 2024-03-24 (×10): 650 mg via ORAL
  Filled 2024-03-20 (×10): qty 1

## 2024-03-20 MED ORDER — SODIUM CHLORIDE 0.9 % IV SOLN: INTRAVENOUS | Status: AC

## 2024-03-20 MED ORDER — TRAMADOL HCL 50 MG PO TABS
50.0000 mg | ORAL_TABLET | Freq: Once | ORAL | Status: AC
  Administered 2024-03-20: 50 mg via ORAL
  Filled 2024-03-20: qty 1

## 2024-03-20 NOTE — Progress Notes (Signed)
 PT Cancellation Note  Patient Details Name: [name] MRN: [id number] DOB: [birth year]   Cancelled Treatment:    Reason Eval/Treat Not Completed: Other (comment) Per OT, pt having issues with frequent BMs this morning.  Will check back as schedule permits.   Tari CROME Payson 03/20/2024, 1:41 PM Tari KLEIN, DPT Physical Therapist Acute Rehabilitation Services Office: [phone]

## 2024-03-20 NOTE — Progress Notes (Signed)
 PROGRESS NOTE    [name]  FMW:[id number] DOB: 1946/02/04 DOA: 03/17/2024 PCP: Avva, Ravisankar, MD    Chief Complaint  Patient presents with   Abdominal Pain   Altered Mental Status   Fever    Brief Narrative:  Per HPI 78 yo female presented today at the instance of her family after finding her more altered this evening. Pt reportedly has been falling recently, however daughter and husband at bedside endorses that pt has not had trauma with her weakness/falls. When pt falls she is usually standing up and slides down. Never loses consciousness. No head or other body trauma. Pt states the only discomfort she has is with her abdomen. Daughter states that pt has had multiple days of diarrhea (c/w her previous cdiff infections), pt states that she does have nausea but this is typical for her, denies emesis.    She states she has previously had dark stool but has not appreciated that since she stopped her Iron supplements. She does have hematuria and states that she passes clots from her urinary tract. She has seen urology as outpt and after cystoscopy they noted cracks in her bladder and she states that the urologist feels her hematuria is 2/2 eliquis  use as well. + temp at home 101-102. 103 at the hospital, but no other si/sx of sepsis (less her diarrhea). She denies any recent steroid use, no recent abx use.    Family feels as though pt's mental status is improving since her presentation. She is on 2mcg of norepi at this time. Gi panel and cdiff + ua pending.    PCCM was asked for admission 2/2 her need for low dose norepi.  Urology consulted due to concerns for left hydronephrosis.   Assessment & Plan:   Principal Problem:   Shock (HCC) Active Problems:   Hypokalemia   Acute cystitis   Persistent atrial fibrillation (HCC)   Anticoagulant long-term use   Acute encephalopathy   Hypomagnesemia   Hypothyroidism   Acute metabolic encephalopathy   Acute renal failure  superimposed on stage 3a chronic kidney disease (HCC)   Hypophosphatemia   Acute blood loss anemia   Anemia of chronic disease  #1 hypotension/shock -Likely multifactorial secondary to volume depletion in the setting of diarrhea acute on chronic, probable UTI. - Patient initially required pressors which have subsequently been weaned off.   - BP improved.   -Urine cultures with 60,000 colonies of E. coli. - GI pathogen panel negative, C. difficile PCR noted to be positive for C. difficile antigen, C. difficile toxin negative likely colonization.  - Fever curve trending down.   - Leukocytosis trending down.   - Vancomycin  has been discontinued.   - Discontinue Flagyl  and cefepime .   - Placed on IV cefazolin  pending finalization of urine cultures.   2.  Acute cystitis/E. coli UTI -Urinalysis concerning for UTI. - Urine cultures with 60,000 colonies of E. coli with sensitivities pending.  -Narrow down IV antibiotics, discontinue IV cefepime  and start on IV cefazolin .   - Once sensitivities are finalized could likely transition to oral antibiotics to complete course of antibiotic treatment. - Follow.  3.  AMS/acute encephalopathy - Improved with improvement with blood pressure and correction of metabolic derangements.  - Patient likely at baseline.  4.  Acute on chronic diarrhea -Patient with history of acute on chronic diarrhea, noted to follow-up in the outpatient setting with Dr. Albertus and noted due to be seen in the office on 03/20/2024 per daughter.   -  GI pathogen panel negative.   - C. difficile antigen positive, C. difficile toxin and PCR negative.   - Clinical improvement.   - Family insistent on GI evaluation during this hospitalization and as such patient seen in consultation by GI who few changes in patient bowel habits likely secondary to irritable bowel/functional in the setting of prior C. difficile infection.   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   - Patient started on colestipol .   - GI do not recommending treating patient for C. difficile at this time.   - GI trying to bulk her stool further with fiber.   - Outpatient follow-up with GI.   5.  Hematuria/left hydronephrosis/solitary kidney -Patient noted to have had some intermittent hematuria noted to have worsening and exacerbated by Eliquis  use as passes clots fairly regularly.   - Patient noted to have undergone a cystoscopy approximately a year ago being followed by Dr. Selma. - CT abdomen and pelvis as well as renal ultrasound with left hydronephrosis noted not completely new but worse from prior going all the way down to the UVJ.   - Patient seen in consultation by urology, who reviewed imaging and imaging without any evidence of acute obstruction and as such did not think any emergent intervention was indicated but are following closely.  - Renal function improving with hydration.   - Continue IV fluids.   - Per urology.  6.  AKI on CKD stage IIIa/status post right nephrectomy remote - Patient on presentation to be in acute kidney injury with a creatinine on presentation of 2.59. - Last creatinine noted of 1.55 (01/31/2024). - Likely secondary to prerenal azotemia as patient noted to be hypotensive on presentation. - Urinalysis turbid, large hemoglobin, large leukocytes, nitrite negative, many bacteria, WBC > 50. - Urine cultures with 60,000 colonies of E. coli - Renal ultrasound and CT abdomen and pelvis with noted mild left hydronephrosis to the level of the bladder trigone, no obstructing mass or calculi is identified. - Patient seen in consultation by urology who are recommending conservative treatments and if no improvement with renal function I have recommended probable stent placement. - Renal function slowly trending down currently at 1.82. - Continue gentle hydration with IV fluids for another 24 hours. - Avoid nephrotoxins.  7.   Hypomagnesemia/hypokalemia/hypophosphatemia - Potassium currently at 4.0, magnesium  at 2.0, phosphorus at 2.7. - Continue K-Phos 250 mg twice daily for total of 3 days.  - Repeat labs in AM.  8.  NAGMA -Improving. -Place on oral bicarb tablets. - Follow.  9.  Chronic A-fib -Patient in A-fib however rate currently controlled. - Resume home regimen bisoprolol  for rate control. - Anticoagulation on hold due to presentation with hematuria, anemia requiring transfusion of 2 units PRBCs on admission, concern for possible urological procedure. - Urology to advise when anticoagulation may be resumed.  10.  Acute blood loss anemia/anemia of chronic disease -Noted to have presented with hypotension, hematuria with hemoglobin dropping as low as 6.8 on presentation. - Status post Kcentra  on presentation to the ED and transfusion of 2 units PRBCs with hemoglobin currently at 11.0. -Follow H&H. - Transfusion threshold hemoglobin < 7.  11.  Hypothyroidism - Continue Synthroid .      DVT prophylaxis: SCDs. Code Status: Full Family Communication: Updated patient, granddaughter at bedside.  Disposition: CIR when medically stable and cleared by urology in the next 24 to 48 hours.  Status is: Inpatient Remains inpatient  appropriate because: Severity of illness   Consultants:  Urology: Dr. Lovie 03/18/2024 Gastroenterology  Procedures:  CT abdomen and pelvis 03/18/2024 Chest x-ray 03/17/2024 Renal ultrasound 03/18/2024 2D echo 03/18/2024 Transfusion 2 units PRBC 03/18/2024  Antimicrobials:  Anti-infectives (From admission, onward)    Start     Dose/Rate Route Frequency Ordered Stop   03/20/24 0000  vancomycin  (VANCOREADY) IVPB 750 mg/150 mL  Status:  Discontinued        750 mg 150 mL/hr over 60 Minutes Intravenous Every 48 hours 03/18/24 0316 03/18/24 1407   03/18/24 2200  ceFEPIme  (MAXIPIME ) 2 g in sodium chloride  0.9 % 100 mL IVPB        2 g 200 mL/hr over 30 Minutes Intravenous Every  24 hours 03/18/24 0314     03/18/24 1000  vancomycin  (VANCOCIN ) capsule 125 mg  Status:  Discontinued        125 mg Oral 4 times daily 03/18/24 0308 03/19/24 2143   03/18/24 1000  metroNIDAZOLE  (FLAGYL ) IVPB 500 mg  Status:  Discontinued        500 mg 100 mL/hr over 60 Minutes Intravenous Every 12 hours 03/18/24 0314 03/20/24 0929   03/18/24 0400  metroNIDAZOLE  (FLAGYL ) IVPB 500 mg  Status:  Discontinued        500 mg 100 mL/hr over 60 Minutes Intravenous  Once 03/18/24 0308 03/18/24 0314   03/18/24 0400  ceFEPIme  (MAXIPIME ) 2 g in sodium chloride  0.9 % 100 mL IVPB  Status:  Discontinued        2 g 200 mL/hr over 30 Minutes Intravenous  Once 03/18/24 0308 03/18/24 0314   03/17/24 2330  vancomycin  (VANCOCIN ) IVPB 1000 mg/200 mL premix        1,000 mg 200 mL/hr over 60 Minutes Intravenous  Once 03/17/24 2322 03/18/24 0238   03/17/24 2215  ceFEPIme  (MAXIPIME ) 2 g in sodium chloride  0.9 % 100 mL IVPB        2 g 200 mL/hr over 30 Minutes Intravenous  Once 03/17/24 2208 03/17/24 2250   03/17/24 2215  metroNIDAZOLE  (FLAGYL ) IVPB 500 mg        500 mg 100 mL/hr over 60 Minutes Intravenous  Once 03/17/24 2208 03/18/24 0005         Subjective: Patient laying in bed.  Patient denies any flank pain.  Stated had multiple loose stools today however per RN stools were more mushy.  Patient denies any chest pain or shortness of breath.  No abdominal pain.  Granddaughter at bedside.  Objective: Vitals:   03/19/24 2207 03/20/24 0301 03/20/24 0500 03/20/24 1300  BP: 125/80 136/76  122/78  Pulse: 99 78  76  Resp: 18 16    Temp: 98.9 F (37.2 C) 100 F (37.8 C)  99.4 F (37.4 C)  TempSrc: Oral Oral  Oral  SpO2: 100% 97%  100%  Weight:   66 kg   Height:        Intake/Output Summary (Last 24 hours) at 03/20/2024 1450 Last data filed at 03/20/2024 1045 Gross per 24 hour  Intake 1169.76 ml  Output --  Net 1169.76 ml   Filed Weights   03/18/24 0229 03/20/24 0500  Weight: 65.9 kg 66 kg     Examination:  General exam: NAD Respiratory system: Lungs clear to auscultation bilaterally.  No wheezes, no crackles, no rhonchi.  Fair air movement.  Speaking in full sentences.   Cardiovascular system: Irregularly irregular.  No JVD, no murmurs rubs or gallops.  No pitting lower extremity  edema.  Gastrointestinal system: Abdomen is soft, nontender, nondistended, positive bowel sounds.  No rebound.  No guarding.  Central nervous system: Alert and oriented. No focal neurological deficits. Extremities: Symmetric 5 x 5 power. Skin: No rashes, lesions or ulcers Psychiatry: Judgement and insight appear normal. Mood & affect appropriate.     Data Reviewed: I have personally reviewed following labs and imaging studies  CBC: Recent Labs  Lab 03/17/24 2206 03/18/24 1014 03/18/24 1821 03/19/24 0139 03/19/24 0726 03/19/24 1558 03/20/24 0725  WBC 10.3 11.7*  --  6.4  --   --  5.2  NEUTROABS 8.8* 10.3*  --   --   --   --  3.5  HGB 6.8* 12.2 12.4 10.8* 11.2* 11.4* 11.0*  HCT 23.2* 38.3 39.7 35.0* 35.7* 36.5 35.0*  MCV 92.1 87.2  --  89.5  --   --  89.5  PLT 158 222  --  175  --   --  195    Basic Metabolic Panel: Recent Labs  Lab 03/17/24 2206 03/17/24 2301 03/18/24 1014 03/19/24 0139 03/20/24 0725  NA 132*  --  134* 133* 134*  K 2.9*  --  4.4 4.7 4.0  CL 108  --  109 109 108  CO2 15*  --  20* 18* 17*  GLUCOSE 105*  --  115* 116* 99  BUN 37*  --  34* 29* 23  CREATININE 2.59*  --  2.24* 1.97* 1.82*  CALCIUM  7.6*  --  9.4 9.0 9.1  MG  --  1.5* 3.5* 2.6* 2.0  PHOS  --   --   --  2.4* 2.7    GFR: Estimated Creatinine Clearance: 23.8 mL/min (A) (by C-G formula based on SCr of 1.82 mg/dL (H)).  Liver Function Tests: Recent Labs  Lab 03/17/24 2206 03/19/24 0139 03/20/24 0725  AST 14* 15  --   ALT 8 16  --   ALKPHOS 48 57  --   BILITOT 0.9 1.1  --   PROT 5.0* 5.1*  --   ALBUMIN 2.5* 2.4* 2.4*    CBG: Recent Labs  Lab 03/19/24 1643 03/19/24 1954  03/20/24 0001 03/20/24 0623 03/20/24 1228  GLUCAP 101* 112* 117* 97 98     Recent Results (from the past 240 hours)  Blood Culture (routine x 2)     Status: None (Preliminary result)   Collection Time: 03/17/24 10:06 PM   Specimen: BLOOD  Result Value Ref Range Status   Specimen Description   Final    BLOOD BLOOD RIGHT ARM Performed at [facility], [address] [place], [place], [place] [address]    Special Requests   Final    BOTTLES DRAWN AEROBIC ONLY Blood Culture results may not be optimal due to an inadequate volume of blood received in culture bottles Performed at [facility], [address], [place], [place] [address]    Culture   Final    NO GROWTH 2 DAYS Performed at [facility], [address] [place], [place] [address]    Report Status PENDING  Incomplete  Resp panel by RT-PCR (RSV, Flu A&B, Covid) Anterior Nasal Swab     Status: None   Collection Time: 03/17/24 11:08 PM   Specimen: Anterior Nasal Swab  Result Value Ref Range Status   SARS Coronavirus 2 by RT PCR NEGATIVE NEGATIVE Final    Comment: (NOTE) SARS-CoV-2 target nucleic acids are NOT DETECTED.  The SARS-CoV-2 RNA is generally detectable in upper respiratory  specimens during the acute phase of infection. The lowest concentration of SARS-CoV-2 viral copies this assay can detect is 138 copies/mL. A negative result does not preclude SARS-Cov-2 infection and should not be used as the sole basis for treatment or other patient management decisions. A negative result may occur with  improper specimen collection/handling, submission of specimen other than nasopharyngeal swab, presence of viral mutation(s) within the areas targeted by this assay, and inadequate number of viral copies(<138 copies/mL). A negative result must be combined with clinical observations, patient history, and epidemiological information. The expected result is Negative.  Fact Sheet for  Patients:  BloggerCourse.com  Fact Sheet for Healthcare Providers:  SeriousBroker.it  This test is no t yet approved or cleared by the [place]  FDA and  has been authorized for detection and/or diagnosis of SARS-CoV-2 by FDA under an Emergency Use Authorization (EUA). This EUA will remain  in effect (meaning this test can be used) for the duration of the COVID-19 declaration under Section 564(b)(1) of the Act, 21 U.S.C.section 360bbb-3(b)(1), unless the authorization is terminated  or revoked sooner.       Influenza A by PCR NEGATIVE NEGATIVE Final   Influenza B by PCR NEGATIVE NEGATIVE Final    Comment: (NOTE) The Xpert Xpress SARS-CoV-2/FLU/RSV plus assay is intended as an aid in the diagnosis of influenza from Nasopharyngeal swab specimens and should not be used as a sole basis for treatment. Nasal washings and aspirates are unacceptable for Xpert Xpress SARS-CoV-2/FLU/RSV testing.  Fact Sheet for Patients: BloggerCourse.com  Fact Sheet for Healthcare Providers: SeriousBroker.it  This test is not yet approved or cleared by the [place]  FDA and has been authorized for detection and/or diagnosis of SARS-CoV-2 by FDA under an Emergency Use Authorization (EUA). This EUA will remain in effect (meaning this test can be used) for the duration of the COVID-19 declaration under Section 564(b)(1) of the Act, 21 U.S.C. section 360bbb-3(b)(1), unless the authorization is terminated or revoked.     Resp Syncytial Virus by PCR NEGATIVE NEGATIVE Final    Comment: (NOTE) Fact Sheet for Patients: BloggerCourse.com  Fact Sheet for Healthcare Providers: SeriousBroker.it  This test is not yet approved or cleared by the [place]  FDA and has been authorized for detection and/or diagnosis of SARS-CoV-2 by FDA under an Emergency  Use Authorization (EUA). This EUA will remain in effect (meaning this test can be used) for the duration of the COVID-19 declaration under Section 564(b)(1) of the Act, 21 U.S.C. section 360bbb-3(b)(1), unless the authorization is terminated or revoked.  Performed at [facility], [address], [place], [place] [address]   Blood Culture (routine x 2)     Status: None (Preliminary result)   Collection Time: 03/17/24 11:35 PM   Specimen: BLOOD LEFT HAND  Result Value Ref Range Status   Specimen Description   Final    BLOOD LEFT HAND Performed at [facility], [address], [place], [place] [address]    Special Requests   Final    BOTTLES DRAWN AEROBIC ONLY Blood Culture results may not be optimal due to an inadequate volume of blood received in culture bottles Performed at [facility], [address] [place], [place] [address]    Culture   Final    NO GROWTH 2 DAYS Performed at [facility], [address] [place], [place] [address]    Report Status PENDING  Incomplete  Urine Culture (for pregnant, neutropenic or urologic patients or  patients with an indwelling urinary catheter)     Status: Abnormal (Preliminary result)   Collection Time: 03/18/24 12:04 AM   Specimen: Urine, Clean Catch  Result Value Ref Range Status   Specimen Description   Final    URINE, CLEAN CATCH Performed at [facility], [address], [place], [place] [address]    Special Requests   Final    NONE Performed at [facility], [address] [place], [place] [address]    Culture (A)  Final    60,000 COLONIES/mL ESCHERICHIA COLI SUSCEPTIBILITIES TO FOLLOW Performed at [facility], [address] [place], [place] [address]    Report Status PENDING  Incomplete  C Difficile Quick Screen w PCR reflex     Status: Abnormal   Collection Time: 03/18/24  1:44 AM   Specimen: STOOL  Result Value  Ref Range Status   C Diff antigen POSITIVE (A) NEGATIVE Final   C Diff toxin NEGATIVE NEGATIVE Final   C Diff interpretation Results are indeterminate. See PCR results.  Final    Comment: Performed at [facility], [address], [place], [place] [address]  Gastrointestinal Panel by PCR , Stool     Status: None   Collection Time: 03/18/24  1:44 AM   Specimen: Stool  Result Value Ref Range Status   Campylobacter species NOT DETECTED NOT DETECTED Final   Plesimonas shigelloides NOT DETECTED NOT DETECTED Final   Salmonella species NOT DETECTED NOT DETECTED Final   Yersinia enterocolitica NOT DETECTED NOT DETECTED Final   Vibrio species NOT DETECTED NOT DETECTED Final   Vibrio cholerae NOT DETECTED NOT DETECTED Final   Enteroaggregative E coli (EAEC) NOT DETECTED NOT DETECTED Final   Enteropathogenic E coli (EPEC) NOT DETECTED NOT DETECTED Final   Enterotoxigenic E coli (ETEC) NOT DETECTED NOT DETECTED Final   Shiga like toxin producing E coli (STEC) NOT DETECTED NOT DETECTED Final   Shigella/Enteroinvasive E coli (EIEC) NOT DETECTED NOT DETECTED Final   Cryptosporidium NOT DETECTED NOT DETECTED Final   Cyclospora cayetanensis NOT DETECTED NOT DETECTED Final   Entamoeba histolytica NOT DETECTED NOT DETECTED Final   Giardia lamblia NOT DETECTED NOT DETECTED Final   Adenovirus F40/41 NOT DETECTED NOT DETECTED Final   Astrovirus NOT DETECTED NOT DETECTED Final   Norovirus GI/GII NOT DETECTED NOT DETECTED Final   Rotavirus A NOT DETECTED NOT DETECTED Final   Sapovirus (I, II, IV, and V) NOT DETECTED NOT DETECTED Final    Comment: Performed at [facility], [address] [place], [place] [address]  C. Diff by PCR, Reflexed     Status: None   Collection Time: 03/18/24  1:44 AM  Result Value Ref Range Status   Toxigenic C. Difficile by PCR NEGATIVE NEGATIVE Final    Comment: Patient is colonized with non toxigenic C. difficile. May not need treatment unless  significant symptoms are present. Performed at [facility], [address] [place], [place] [address]   MRSA Next Gen by PCR, Nasal     Status: None   Collection Time: 03/18/24  3:45 AM   Specimen: Nasal Mucosa; Nasal Swab  Result Value Ref Range Status   MRSA by PCR Next Gen NOT DETECTED NOT DETECTED Final    Comment: (NOTE) The GeneXpert MRSA Assay (FDA approved for NASAL specimens only), is one component of a comprehensive MRSA colonization surveillance program. It is not intended to diagnose MRSA infection nor to guide or monitor  treatment for MRSA infections. Test performance is not FDA approved in patients less than 71 years old. Performed at [facility], [address] [place], [place] [address]          Radiology Studies: No results found.       Scheduled Meds:  bisoprolol   2.5 mg Oral Daily   buPROPion   75 mg Oral q morning   Chlorhexidine  Gluconate Cloth  6 each Topical Daily   cholecalciferol   1,000 Units Oral Daily   colestipol   1 g Oral BID   FLUoxetine   30 mg Oral QHS   gabapentin   300 mg Oral BID   insulin  aspart  0-15 Units Subcutaneous Q6H   levothyroxine   88 mcg Oral Q0600   pantoprazole   40 mg Oral BID   phosphorus  250 mg Oral BID   psyllium  1 packet Oral Daily   saccharomyces boulardii  250 mg Oral BID   sodium bicarbonate   650 mg Oral BID   Continuous Infusions:  sodium chloride  100 mL/hr at 03/20/24 1037   ceFEPime  (MAXIPIME ) IV 2 g (03/19/24 2315)     LOS: 2 days    Time spent: 40 minutes    Toribio Hummer, MD [facility]   To contact the attending provider between 7A-7P or the covering provider during after hours 7P-7A, please log into the web site [email or web address] and access using universal [place] password for that web site. If you do not have the password, please call the hospital operator.  03/20/2024, 2:50 PM
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
## 2024-03-20 NOTE — TOC Initial Note (Signed)
 Transition of Care [facility]) - Initial/Assessment Note    Patient Details  Name: [name] MRN: [id number] Date of Birth: [birth year]  Transition of Care [facility]) CM/SW Contact:    Tawni CHRISTELLA Eva, LCSW Phone Number: 03/20/2024, 8:42 AM  Clinical Narrative:                 Noted pt is rec for AIR. CIR will be reviewing for possible placement. Care Management to follow.  Expected Discharge Plan:  (TBD) Barriers to Discharge: Continued Medical Work up   Patient Goals and CMS Choice            Expected Discharge Plan and Services                                              Prior Living Arrangements/Services                       Activities of Daily Living   ADL Screening (condition at time of admission) Independently performs ADLs?: No Does the patient have a NEW difficulty with bathing/dressing/toileting/self-feeding that is expected to last >3 days?: No Does the patient have a NEW difficulty with getting in/out of bed, walking, or climbing stairs that is expected to last >3 days?: No Does the patient have a NEW difficulty with communication that is expected to last >3 days?: No Is the patient deaf or have difficulty hearing?: No Does the patient have difficulty seeing, even when wearing glasses/contacts?: No Does the patient have difficulty concentrating, remembering, or making decisions?: No  Permission Sought/Granted                  Emotional Assessment              Admission diagnosis:  [facility]) [R57.9] Patient Active Problem List   Diagnosis Date Noted   Hypophosphatemia 03/19/2024   Acute blood loss anemia 03/19/2024   Anemia of chronic disease 03/19/2024   Shock (HCC) 03/18/2024   Acute hypoxemic respiratory failure (HCC) 01/25/2024   Pyelonephritis 01/02/2024   Acute renal failure superimposed on stage 3a chronic kidney disease (HCC) 01/02/2024   COPD (chronic obstructive pulmonary disease) (HCC)    Gastritis  and gastroduodenitis 07/12/2023   Dilated pancreatic duct 07/12/2023   Dilation of biliary tract 07/03/2023   Acute cystitis 05/22/2023   Abdominal pain 05/22/2023   History of CVA (cerebrovascular accident) 05/22/2023   GAD (generalized anxiety disorder) 05/22/2023   Lung cancer (HCC) 03/01/2023   Lung nodule 02/15/2023   Clostridioides difficile infection 12/11/2022   Malnutrition of moderate degree (HCC) 12/08/2022   Intractable nausea and vomiting 12/07/2022   Hyponatremia 12/07/2022   Acute urinary retention 06/18/2022   Acute metabolic encephalopathy 06/17/2022   Pyuria 06/17/2022   GERD (gastroesophageal reflux disease) 06/17/2022   Hyperglycemia 06/17/2022   Low back pain 05/28/2020   Dilation of pancreatic duct 05/11/2020   Anterior dislocation of right shoulder 06/17/2019   Shoulder pain, right 06/17/2019   Duodenitis 02/24/2019   Chronic pain syndrome 02/24/2019   Debility 02/13/2019   Nausea and vomiting    Aspiration pneumonia (HCC)    Diarrhea    Pressure injury of skin 02/08/2019   Pneumonia of right lower lobe due to methicillin susceptible Staphylococcus aureus (MSSA) (HCC)    Volume overload state of heart    Acute encephalopathy 02/01/2019  Acute respiratory failure with hypoxia (HCC) 02/01/2019   Hypokalemia 02/01/2019   Hypomagnesemia 02/01/2019   Leukocytosis 02/01/2019   Osteoarthritis 02/01/2019   Hypothyroidism 02/01/2019   Anticoagulant long-term use    Persistent atrial fibrillation (HCC) 06/27/2017   Hip fracture (HCC) 06/27/2017   Irregular heart rate    Closed right hip fracture, initial encounter (HCC) 06/26/2017   Protein-calorie malnutrition, severe 06/30/2016   S/P laparoscopic cholecystectomy 06/28/2016   Visual field loss following stroke    Hyperlipidemia    Essential hypertension    PCP:  Janey Santos, MD Pharmacy:   [facility] [id number], [place] - [address] [place]  [address] Phone: [phone] Fax: [phone]     Social Drivers of Health (SDOH) Social History: SDOH Screenings   Food Insecurity: No Food Insecurity (03/18/2024)  Housing: Low Risk  (03/18/2024)  Transportation Needs: No Transportation Needs (03/18/2024)  Utilities: Not At Risk (03/18/2024)  Depression (PHQ2-9): Low Risk  (02/28/2023)  Social Connections: Socially Integrated (03/18/2024)  Tobacco Use: Medium Risk (03/18/2024)   SDOH Interventions:     Readmission Risk Interventions    01/27/2024   10:41 AM 01/03/2024   12:11 PM 05/23/2023   10:20 AM  Readmission Risk Prevention Plan  Transportation Screening Complete Complete Complete  PCP or Specialist Appt within 5-7 Days Complete  Complete  PCP or Specialist Appt within 3-5 Days  Complete   Home Care Screening Complete  Complete  Medication Review (RN CM) Complete  Complete  HRI or Home Care Consult  Complete   Social Work Consult for Recovery Care Planning/Counseling  Complete   Palliative Care Screening  Complete   Medication Review Oceanographer)  Complete

## 2024-03-20 NOTE — Progress Notes (Signed)
 Patient ID: [name], female   DOB: 1945/12/11, 78 y.o.   MRN: [id number]    Subjective: No  complaints.  No flank pain.  Objective: Vital signs in last 24 hours: Temp:  [98.4 F (36.9 C)-100 F (37.8 C)] 100 F (37.8 C) (07/31 0301) Pulse Rate:  [68-99] 78 (07/31 0301) Resp:  [16-28] 16 (07/31 0301) BP: (110-137)/(58-92) 136/76 (07/31 0301) SpO2:  [97 %-100 %] 97 % (07/31 0301) Weight:  [66 kg] 66 kg (07/31 0500)  Intake/Output from previous day: 07/30 0701 - 07/31 0700 In: 809.8 [I.V.:519.7; IV Piggyback:290.1] Out: -  Intake/Output this shift: No intake/output data recorded.  Physical Exam:  General: Alert and oriented GU: No CVAT  Lab Results: Recent Labs    03/19/24 0726 03/19/24 1558 03/20/24 0725  HGB 11.2* 11.4* 11.0*  HCT 35.7* 36.5 35.0*   BMET Recent Labs    03/19/24 0139 03/20/24 0725  NA 133* 134*  K 4.7 4.0  CL 109 108  CO2 18* 17*  GLUCOSE 116* 99  BUN 29* 23  CREATININE 1.97* 1.82*  CALCIUM  9.0 9.1     Studies/Results:   Assessment/Plan: Hydronephrosis and solitary kidney:  Renal function improving with solitary kidney and chronic hydronephrosis.  Will have the office call to schedule outpatient follow up over the next month.  Will sign off.   LOS: 2 days   Noretta Ferrara 03/20/2024, 9:49 AM

## 2024-03-20 NOTE — Plan of Care (Signed)

## 2024-03-20 NOTE — Evaluation (Signed)
 Occupational Therapy Evaluation Patient Details Name: [name] MRN: [id number] DOB: [birth year] Today's Date: 03/20/2024   History of Present Illness   Pt is a 78 yr old female admitted on 03/17/24 after presenting with AMS with frequent falling. Pt is being treated for acute hypotension and possible UTI. PMH: lung CA, recurrent C. Diff colitis, a-fib on eliquis , CVA, CKD 3A     Clinical Impressions The pt is currently presenting below her baseline level of functioning for self-care management, as she is limited by the below listed deficits (see OT problem list). During the session, she required max assist for lower body dressing in bed, min assist for rolling, and mod assist to achieve partial dangle at the edge of the bed; after attempting supine to sit, she only advanced to getting half way off the bed, before she requested to cease further mobility, due to concerns about fatigue, malaise, and ongoing issues with loose bowels. She reported today just wasn't a good day for her, given the aforementioned. She will benefit from further OT services to maximize her ADL performance and to decrease the risk for further weakness and restricted participation in meaningful activities. Post-acute therapy services are recommended.        If plan is discharge home, recommend the following:   Assistance with cooking/housework;Assist for transportation;Help with stairs or ramp for entrance;A lot of help with bathing/dressing/bathroom     Functional Status Assessment   Patient has had a recent decline in their functional status and demonstrates the ability to make significant improvements in function in a reasonable and predictable amount of time.     Equipment Recommendations   Other (comment) (to be determined pending progress at next level of care)     Recommendations for Other Services         Precautions/Restrictions   Precautions Precautions: Fall Restrictions Weight  Bearing Restrictions Per Provider Order: No     Mobility Bed Mobility Overal bed mobility: Needs Assistance Bed Mobility: Rolling, Sit to Supine Rolling: Min assist   Supine to sit: Mod assist Sit to supine: Min assist   General bed mobility comments: the pt proceeded to attempt supine to sit, however she only advanced to half way off the bed, before she requested to cease further mobility, due to concerns about fatigue, malaise and ongoing issues with loose bowels    Transfers          General transfer comment: unable to assess      Balance       Sitting balance - Comments: unable to assess       Standing balance comment: unable to assess            ADL either performed or assessed with clinical judgement   ADL Overall ADL's : Needs assistance/impaired Eating/Feeding: Set up;Bed level Eating/Feeding Details (indicate cue type and reason): assist needed to cut food, open containers and remove lids, due to pronounced arthritic changes of hands Grooming: Set up;Supervision/safety;Bed level       Lower Body Bathing: Maximal assistance;Bed level   Upper Body Dressing : Minimal assistance;Bed level   Lower Body Dressing: Maximal assistance;Bed level           Vision   Additional Comments: She correctly read the time depicted on the wall clock.            Pertinent Vitals/Pain Pain Assessment Pain Assessment: 0-10 Pain Score: 8  Pain Location: abdomen Pain Intervention(s): Limited activity within patient's tolerance, Monitored during session,  Patient requesting pain meds-RN notified     Extremity/Trunk Assessment Upper Extremity Assessment Upper Extremity Assessment: Right hand dominant;RUE deficits/detail;LUE deficits/detail RUE Deficits / Details: Shoulder and elbow AROM WFL. 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 LUE Coordination: decreased fine motor   Lower Extremity Assessment Lower Extremity Assessment: Generalized weakness;LLE deficits/detail;RLE deficits/detail RLE Deficits / Details: AROM WFL LLE Deficits / Details: AROM WFL       Communication Communication Communication: No apparent difficulties   Cognition Arousal: Alert Behavior During Therapy: WFL for tasks assessed/performed               OT - Cognition Comments: Oriented x4        Following commands: Intact                  Home Living Family/patient expects to be discharged to:: Private residence Living Arrangements: Spouse/significant other Available Help at Discharge: Family Type of Home: Other(Comment) (Condo) Home Access: Stairs to enter Secretary/administrator of Steps: 4 Entrance Stairs-Rails: Left Home Layout: One level     Bathroom Shower/Tub: Walk-in shower;Tub/shower unit         Home Equipment: Rollator (4 wheels);Cane - single point;Wheelchair - Nurse, children's (2 wheels)          Prior Functioning/Environment Prior Level of Function : Needs assist             Mobility Comments:  (She used a rollator for ambulation inside the home. 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 She doesn't drive & her spouse managed 95% of the cooking & cleaning.)    OT Problem List: Decreased strength;Decreased activity tolerance;Impaired balance (sitting and/or standing);Decreased coordination;Pain;Impaired sensation;Impaired UE functional use   OT Treatment/Interventions: Self-care/ADL  training;Therapeutic exercise;Therapeutic activities;Energy conservation;Patient/family education;DME and/or AE instruction;Balance training      OT Goals(Current goals can be found in the care plan section)   Acute Rehab OT Goals OT Goal Formulation: With patient Time For Goal Achievement: 04/03/24 Potential to Achieve Goals: Good ADL Goals Pt Will Perform Grooming: with set-up;with supervision;standing Pt Will Perform Lower Body Dressing: with supervision;sit to/from stand;sitting/lateral leans Pt Will Transfer to Toilet: with supervision;ambulating Pt Will Perform Toileting - Clothing Manipulation and hygiene: with supervision;sit to/from stand   OT Frequency:  Min 2X/week       AM-PAC OT 6 Clicks Daily Activity     Outcome Measure Help from another person eating meals?: A Little Help from another person taking care of personal grooming?: A Little Help from another person toileting, which includes using toliet, bedpan, or urinal?: A Lot Help from another person bathing (including washing, rinsing, drying)?: A Lot Help from another person to put on and taking off regular upper body clothing?: A Little Help from another person to put on and taking off regular lower body clothing?: A Lot 6 Click Score: 15   End of Session Equipment Utilized During Treatment: Other (comment) (N/A) Nurse Communication: Mobility status  Activity Tolerance: Patient limited by fatigue Patient left: in bed;with call bell/phone within reach;with bed  alarm set;with family/visitor present  OT Visit Diagnosis: History of falling (Z91.81);Muscle weakness (generalized) (M62.81);Pain;Other abnormalities of gait and mobility (R26.89) Pain - part of body:  (abdomen)                Time: 8881-8864 OT Time Calculation (min): 17 min Charges:  OT General Charges $OT Visit: 1 Visit OT Evaluation $OT Eval Moderate Complexity: 1 Mod    Trenice Mesa L Yoshi Vicencio, OTR/L 03/20/2024, 4:02 PM

## 2024-03-20 NOTE — Progress Notes (Signed)
 Inpatient Rehab Admissions:  Inpatient Rehab Consult received.  I spoke with patient and her husband Lorence on the telephone for rehabilitation assessment and to discuss goals and expectations of an inpatient rehab admission.  Discussed average length of stay, insurance authorization requirement and discharge home after completion of CIR. Both acknowledged understanding. Pt interested in pursuing CIR and Ervin supportive. Lorence confirmed that he will be able to provide 24/7 support for pt after discharge. Will continue to follow.  Signed: Tinnie Yvone Cohens, MS, CCC-SLP Admissions Coordinator [phone]

## 2024-03-21 DIAGNOSIS — D62 Acute posthemorrhagic anemia: Secondary | ICD-10-CM | POA: Diagnosis not present

## 2024-03-21 DIAGNOSIS — R579 Shock, unspecified: Secondary | ICD-10-CM | POA: Diagnosis not present

## 2024-03-21 DIAGNOSIS — G9341 Metabolic encephalopathy: Secondary | ICD-10-CM | POA: Diagnosis not present

## 2024-03-21 DIAGNOSIS — N3001 Acute cystitis with hematuria: Secondary | ICD-10-CM | POA: Diagnosis not present

## 2024-03-21 LAB — CBC WITH DIFFERENTIAL/PLATELET
Abs Immature Granulocytes: 0.04 K/uL (ref 0.00–0.07)
Basophils Absolute: 0 K/uL (ref 0.0–0.1)
Basophils Relative: 0 %
Eosinophils Absolute: 0.1 K/uL (ref 0.0–0.5)
Eosinophils Relative: 2 %
HCT: 36.5 % (ref 36.0–46.0)
Hemoglobin: 11 g/dL — ABNORMAL LOW (ref 12.0–15.0)
Immature Granulocytes: 1 %
Lymphocytes Relative: 18 %
Lymphs Abs: 1.1 K/uL (ref 0.7–4.0)
MCH: 27.2 pg (ref 26.0–34.0)
MCHC: 30.1 g/dL (ref 30.0–36.0)
MCV: 90.1 fL (ref 80.0–100.0)
Monocytes Absolute: 0.8 K/uL (ref 0.1–1.0)
Monocytes Relative: 13 %
Neutro Abs: 4 K/uL (ref 1.7–7.7)
Neutrophils Relative %: 66 %
Platelets: 209 K/uL (ref 150–400)
RBC: 4.05 MIL/uL (ref 3.87–5.11)
RDW: 14.8 % (ref 11.5–15.5)
WBC: 6 K/uL (ref 4.0–10.5)
nRBC: 0 % (ref 0.0–0.2)

## 2024-03-21 LAB — URINE CULTURE: Culture: 60000 — AB

## 2024-03-21 LAB — GLUCOSE, CAPILLARY
Glucose-Capillary: 119 mg/dL — ABNORMAL HIGH (ref 70–99)
Glucose-Capillary: 122 mg/dL — ABNORMAL HIGH (ref 70–99)
Glucose-Capillary: 77 mg/dL (ref 70–99)
Glucose-Capillary: 88 mg/dL (ref 70–99)

## 2024-03-21 LAB — RENAL FUNCTION PANEL
Albumin: 2.5 g/dL — ABNORMAL LOW (ref 3.5–5.0)
Anion gap: 9 (ref 5–15)
BUN: 19 mg/dL (ref 8–23)
CO2: 18 mmol/L — ABNORMAL LOW (ref 22–32)
Calcium: 9.4 mg/dL (ref 8.9–10.3)
Chloride: 113 mmol/L — ABNORMAL HIGH (ref 98–111)
Creatinine, Ser: 1.47 mg/dL — ABNORMAL HIGH (ref 0.44–1.00)
GFR, Estimated: 36 mL/min — ABNORMAL LOW (ref >60)
Glucose, Bld: 92 mg/dL (ref 70–99)
Phosphorus: 3.1 mg/dL (ref 2.5–4.6)
Potassium: 3.8 mmol/L (ref 3.5–5.1)
Sodium: 140 mmol/L (ref 135–145)

## 2024-03-21 LAB — MAGNESIUM: Magnesium: 1.7 mg/dL (ref 1.7–2.4)

## 2024-03-21 MED ORDER — MAGNESIUM SULFATE 2 GM/50ML IV SOLN
2.0000 g | Freq: Once | INTRAVENOUS | Status: AC
  Administered 2024-03-21: 2 g via INTRAVENOUS
  Filled 2024-03-21: qty 50

## 2024-03-21 NOTE — Progress Notes (Signed)
 Physical Therapy Treatment Patient Details Name: [name] MRN: [id number] DOB: [birth year] Today's Date: 03/21/2024   History of Present Illness Pt is a 78 y/o female admitted on 03/17/24 after presenting with c/o AMS with frequent falling. Pt is being treated for acute hypotension, multifactorial in origin. PMH: lung CA, recurrent C. Diff colitis, a-fib on eliquis , CVA, CKD 3A    PT Comments  Pt agreeable for OOB to recliner activity today. Pt still hesitant to ambulate due to frequent loose BMs.  Pt assisted to recliner and then performed LE exercises.  Current d/c plan for [facility].    If plan is discharge home, recommend the following: Assist for transportation;Assistance with cooking/housework;Help with stairs or ramp for entrance;A little help with walking and/or transfers;A little help with bathing/dressing/bathroom   Can travel by private vehicle        Equipment Recommendations  None recommended by PT    Recommendations for Other Services       Precautions / Restrictions Precautions Precautions: Fall     Mobility  Bed Mobility Overal bed mobility: Needs Assistance Bed Mobility: Supine to Sit     Supine to sit: Min assist, HOB elevated, Used rails     General bed mobility comments: assist for trunk upright, increased time and effort    Transfers Overall transfer level: Needs assistance Equipment used: Rolling walker (2 wheels) Transfers: Sit to/from Stand Sit to Stand: Min assist, +2 safety/equipment, From elevated surface           General transfer comment: verbal cues for hand placement and weight shifting; assist to rise, stabilize and control descent; pt preferred only transfer OOB due to still having frequent BMs    Ambulation/Gait                   Stairs             Wheelchair Mobility     Tilt Bed    Modified Rankin (Stroke Patients Only)       Balance Overall balance assessment: Needs assistance         Standing  balance support: Bilateral upper extremity supported, Reliant on assistive device for balance Standing balance-Leahy Scale: Poor                              Communication Communication Communication: No apparent difficulties  Cognition Arousal: Alert Behavior During Therapy: WFL for tasks assessed/performed   PT - Cognitive impairments: No apparent impairments                         Following commands: Intact      Cueing Cueing Techniques: Verbal cues  Exercises General Exercises - Lower Extremity Ankle Circles/Pumps: AROM, Both, 10 reps, Seated Long Arc Quad: AROM, Both, 10 reps, Seated Hip ABduction/ADduction: AROM, Supine, Both, 10 reps Hip Flexion/Marching: AROM, Both, 10 reps, Seated    General Comments        Pertinent Vitals/Pain Pain Assessment Pain Assessment: No/denies pain Pain Intervention(s): Monitored during session, Repositioned    Home Living                          Prior Function            PT Goals (current goals can now be found in the care plan section) Progress towards PT goals: Progressing toward goals    Frequency  Min 2X/week      PT Plan      Co-evaluation              AM-PAC PT 6 Clicks Mobility   Outcome Measure  Help needed turning from your back to your side while in a flat bed without using bedrails?: A Little Help needed moving from lying on your back to sitting on the side of a flat bed without using bedrails?: A Little Help needed moving to and from a bed to a chair (including a wheelchair)?: A Little Help needed standing up from a chair using your arms (e.g., wheelchair or bedside chair)?: A Little Help needed to walk in hospital room?: A Lot Help needed climbing 3-5 steps with a railing? : A Lot 6 Click Score: 16    End of Session Equipment Utilized During Treatment: Gait belt Activity Tolerance: Patient tolerated treatment well Patient left: in chair;with chair alarm  set;with family/visitor present;with call bell/phone within reach   PT Visit Diagnosis: Difficulty in walking, not elsewhere classified (R26.2);Muscle weakness (generalized) (M62.81)     Time: 8499-8485 PT Time Calculation (min) (ACUTE ONLY): 14 min  Charges:    $Therapeutic Activity: 8-22 mins PT General Charges $$ ACUTE PT VISIT: 1 Visit                    Katie PT, DPT Physical Therapist Acute Rehabilitation Services Office: [phone]    [name] [place] 03/21/2024, 4:58 PM

## 2024-03-21 NOTE — Progress Notes (Signed)
 PROGRESS NOTE    [name]  FMW:[id number] DOB: Sep 28, 1945 DOA: 03/17/2024 PCP: Avva, Ravisankar, MD    Chief Complaint  Patient presents with   Abdominal Pain   Altered Mental Status   Fever    Brief Narrative:  Per HPI 78 yo female presented today at the instance of her family after finding her more altered this evening. Pt reportedly has been falling recently, however daughter and husband at bedside endorses that pt has not had trauma with her weakness/falls. When pt falls she is usually standing up and slides down. Never loses consciousness. No head or other body trauma. Pt states the only discomfort she has is with her abdomen. Daughter states that pt has had multiple days of diarrhea (c/w her previous cdiff infections), pt states that she does have nausea but this is typical for her, denies emesis.    She states she has previously had dark stool but has not appreciated that since she stopped her Iron supplements. She does have hematuria and states that she passes clots from her urinary tract. She has seen urology as outpt and after cystoscopy they noted cracks in her bladder and she states that the urologist feels her hematuria is 2/2 eliquis  use as well. + temp at home 101-102. 103 at the hospital, but no other si/sx of sepsis (less her diarrhea). She denies any recent steroid use, no recent abx use.    Family feels as though pt's mental status is improving since her presentation. She is on 2mcg of norepi at this time. Gi panel and cdiff + ua pending.    PCCM was asked for admission 2/2 her need for low dose norepi.  Urology consulted due to concerns for left hydronephrosis.   Assessment & Plan:   Principal Problem:   Shock (HCC) Active Problems:   Hypokalemia   Acute cystitis   Persistent atrial fibrillation (HCC)   Anticoagulant long-term use   Acute encephalopathy   Hypomagnesemia   Hypothyroidism   Acute metabolic encephalopathy   Acute renal failure  superimposed on stage 3a chronic kidney disease (HCC)   Hypophosphatemia   Acute blood loss anemia   Anemia of chronic disease  #1 hypotension/shock -Likely multifactorial secondary to volume depletion in the setting of diarrhea acute on chronic, probable UTI. - Patient initially required pressors which have subsequently been weaned off.   - BP improved.   -Urine cultures with 60,000 colonies of E. coli. - GI pathogen panel negative, C. difficile PCR noted to be positive for C. difficile antigen, C. difficile toxin negative likely colonization.  - Fever curve trending down.   - Leukocytosis trending down.   - Vancomycin  has been discontinued.   - Discontinued Flagyl  and cefepime .   - Placed on IV cefazolin  pending finalization of urine cultures.   2.  Acute cystitis/E. coli UTI -Urinalysis concerning for UTI. - Urine cultures with 60,000 colonies of E. coli with sensitivities pending.  -Was on IV cefepime  and has been narrowed to IV cefazolin .  - Will likely transition to [facility] tomorrow to complete course of antibiotic treatment.  - Follow.  3.  AMS/acute encephalopathy - Improved with improvement with blood pressure and correction of metabolic derangements.  - Patient likely at baseline.  4.  Acute on chronic diarrhea -Patient with history of acute on chronic diarrhea, noted to follow-up in the outpatient setting with Dr. Albertus and noted due to be seen in the office on 03/20/2024 per daughter.   - GI pathogen panel negative.   -  C. difficile antigen positive, C. difficile toxin and PCR negative.   - Clinical improvement.   - Family insistent on GI evaluation during this hospitalization and as such patient seen in consultation by GI who few changes in patient bowel habits likely secondary to irritable bowel/functional in the setting of prior C. difficile infection.   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   - Patient started on  colestipol  and Metamucil.   - GI do not recommending treating patient for C. difficile at this time.   - GI trying to bulk her stool further with fiber.   - Outpatient follow-up with GI.   5.  Hematuria/left hydronephrosis/solitary kidney -Patient noted to have had some intermittent hematuria noted to have worsening and exacerbated by Eliquis  use as passes clots fairly regularly.   - Patient noted to have undergone a cystoscopy approximately a year ago being followed by Dr. Selma. - CT abdomen and pelvis as well as renal ultrasound with left hydronephrosis noted not completely new but worse from prior going all the way down to the UVJ.   - Patient seen in consultation by urology, who reviewed imaging and imaging without any evidence of acute obstruction and as such did not think any emergent intervention was indicated but are following closely.  - Renal function improving with hydration.   - Saline lock IV fluids - Per urology.  6.  AKI on CKD stage IIIa/status post right nephrectomy remote - Patient on presentation to be in acute kidney injury with a creatinine on presentation of 2.59. - Last creatinine noted of 1.55 (01/31/2024). - Likely secondary to prerenal azotemia as patient noted to be hypotensive on presentation. - Urinalysis turbid, large hemoglobin, large leukocytes, nitrite negative, many bacteria, WBC > 50. - Urine cultures with 60,000 colonies of E. coli - Renal ultrasound and CT abdomen and pelvis with noted mild left hydronephrosis to the level of the bladder trigone, no obstructing mass or calculi is identified. - Patient seen in consultation by urology who are recommending conservative treatments and if no improvement with renal function I have recommended probable stent placement. - Renal function slowly trending down currently at 1.47 - Saline lock IV fluids. - Avoid nephrotoxins.  7.  Hypomagnesemia/hypokalemia/hypophosphatemia - Potassium currently at 3.8, magnesium  at  1.7, phosphorus at 3.1. - Continue K-Phos 250 mg twice daily for total of 3 days.  -Magnesium  sulfate 2 g IV x 1. - Repeat labs in AM.  8.  NAGMA -Improving. - Continue bicarb tablets.  - Follow.  9.  Chronic A-fib -Patient in A-fib however rate currently controlled. - Resume home regimen bisoprolol  for rate control. - Anticoagulation on hold due to presentation with hematuria, anemia requiring transfusion of 2 units PRBCs on admission, concern for possible urological procedure. - Urology to advise when anticoagulation may be resumed.  10.  Acute blood loss anemia/anemia of chronic disease -Noted to have presented with hypotension, hematuria with hemoglobin dropping as low as 6.8 on presentation. - Status post Kcentra  on presentation to the ED and transfusion of 2 units PRBCs with hemoglobin currently at 11.0. -Follow H&H. - Transfusion threshold hemoglobin < 7.  11.  Hypothyroidism - Synthroid .       DVT prophylaxis: SCDs. Code Status: Full Family Communication: Updated patient, daughter at bedside.  Disposition: CIR when medically stable.  Status is: Inpatient Remains inpatient appropriate because: Severity of illness   Consultants:  Urology: Dr. Lovie 03/18/2024 Gastroenterology  Procedures:  CT abdomen and pelvis 03/18/2024 Chest x-ray 03/17/2024 Renal ultrasound 03/18/2024 2D echo 03/18/2024 Transfusion 2 units PRBC 03/18/2024  Antimicrobials:  Anti-infectives (From admission, onward)    Start     Dose/Rate Route Frequency Ordered Stop   03/20/24 2200  ceFAZolin  (ANCEF ) IVPB 1 g/50 mL premix        1 g 100 mL/hr over 30 Minutes Intravenous Every 12 hours 03/20/24 1458     03/20/24 0000  vancomycin  (VANCOREADY) IVPB 750 mg/150 mL  Status:  Discontinued        750 mg 150 mL/hr over 60 Minutes Intravenous Every 48 hours 03/18/24 0316 03/18/24 1407   03/18/24 2200  ceFEPIme  (MAXIPIME ) 2 g in sodium chloride  0.9 % 100 mL IVPB  Status:  Discontinued        2 g 200  mL/hr over 30 Minutes Intravenous Every 24 hours 03/18/24 0314 03/20/24 1458   03/18/24 1000  vancomycin  (VANCOCIN ) capsule 125 mg  Status:  Discontinued        125 mg Oral 4 times daily 03/18/24 0308 03/19/24 2143   03/18/24 1000  metroNIDAZOLE  (FLAGYL ) IVPB 500 mg  Status:  Discontinued        500 mg 100 mL/hr over 60 Minutes Intravenous Every 12 hours 03/18/24 0314 03/20/24 0929   03/18/24 0400  metroNIDAZOLE  (FLAGYL ) IVPB 500 mg  Status:  Discontinued        500 mg 100 mL/hr over 60 Minutes Intravenous  Once 03/18/24 0308 03/18/24 0314   03/18/24 0400  ceFEPIme  (MAXIPIME ) 2 g in sodium chloride  0.9 % 100 mL IVPB  Status:  Discontinued        2 g 200 mL/hr over 30 Minutes Intravenous  Once 03/18/24 0308 03/18/24 0314   03/17/24 2330  vancomycin  (VANCOCIN ) IVPB 1000 mg/200 mL premix        1,000 mg 200 mL/hr over 60 Minutes Intravenous  Once 03/17/24 2322 03/18/24 0238   03/17/24 2215  ceFEPIme  (MAXIPIME ) 2 g in sodium chloride  0.9 % 100 mL IVPB        2 g 200 mL/hr over 30 Minutes Intravenous  Once 03/17/24 2208 03/17/24 2250   03/17/24 2215  metroNIDAZOLE  (FLAGYL ) IVPB 500 mg        500 mg 100 mL/hr over 60 Minutes Intravenous  Once 03/17/24 2208 03/18/24 0005         Subjective: Patient lying in bed.  Denies any significant flank pain.  States still had multiple loose stools today.  Denies any chest pain or shortness of breath.  Overall feels well.  Objective: Vitals:   03/20/24 1300 03/21/24 0437 03/21/24 0500 03/21/24 1259  BP: 122/78 (!) 155/99  (!) 136/99  Pulse: 76 80  91  Resp:  18    Temp: 99.4 F (37.4 C) 99.1 F (37.3 C)  99.3 F (37.4 C)  TempSrc: Oral Oral  Oral  SpO2: 100% 96%  98%  Weight:   66.4 kg   Height:        Intake/Output Summary (Last 24 hours) at 03/21/2024 1316 Last data filed at 03/21/2024 0900 Gross per 24 hour  Intake 1650 ml  Output 1400 ml  Net 250 ml   Filed Weights   03/18/24 0229 03/20/24 0500 03/21/24 0500  Weight: 65.9 kg 66 kg  66.4 kg    Examination:  General exam: NAD Respiratory system: CTAB.  No wheezes, no crackles, no rhonchi.  Fair air movement.  Speaking in full sentences.  Cardiovascular system: Irregularly irregular.  No JVD, no murmurs rubs or gallops.  No pitting lower extremity edema. Gastrointestinal system: Abdomen is soft, nontender, nondistended, positive bowel sounds.  No rebound.  No guarding.  Central nervous system: Alert and oriented. No focal neurological deficits. Extremities: Symmetric 5 x 5 power. Skin: No rashes, lesions or ulcers Psychiatry: Judgement and insight appear normal. Mood & affect appropriate.     Data Reviewed: I have personally reviewed following labs and imaging studies  CBC: Recent Labs  Lab 03/17/24 2206 03/18/24 1014 03/18/24 1821 03/19/24 0139 03/19/24 0726 03/19/24 1558 03/20/24 0725 03/21/24 0427  WBC 10.3 11.7*  --  6.4  --   --  5.2 6.0  NEUTROABS 8.8* 10.3*  --   --   --   --  3.5 4.0  HGB 6.8* 12.2   < > 10.8* 11.2* 11.4* 11.0* 11.0*  HCT 23.2* 38.3   < > 35.0* 35.7* 36.5 35.0* 36.5  MCV 92.1 87.2  --  89.5  --   --  89.5 90.1  PLT 158 222  --  175  --   --  195 209   < > = values in this interval not displayed.    Basic Metabolic Panel: Recent Labs  Lab 03/17/24 2206 03/17/24 2301 03/18/24 1014 03/19/24 0139 03/20/24 0725 03/21/24 0427  NA 132*  --  134* 133* 134* 140  K 2.9*  --  4.4 4.7 4.0 3.8  CL 108  --  109 109 108 113*  CO2 15*  --  20* 18* 17* 18*  GLUCOSE 105*  --  115* 116* 99 92  BUN 37*  --  34* 29* 23 19  CREATININE 2.59*  --  2.24* 1.97* 1.82* 1.47*  CALCIUM  7.6*  --  9.4 9.0 9.1 9.4  MG  --  1.5* 3.5* 2.6* 2.0 1.7  PHOS  --   --   --  2.4* 2.7 3.1    GFR: Estimated Creatinine Clearance: 29.5 mL/min (A) (by C-G formula based on SCr of 1.47 mg/dL (H)).  Liver Function Tests: Recent Labs  Lab 03/17/24 2206 03/19/24 0139 03/20/24 0725 03/21/24 0427  AST 14* 15  --   --   ALT 8 16  --   --   ALKPHOS 48 57   --   --   BILITOT 0.9 1.1  --   --   PROT 5.0* 5.1*  --   --   ALBUMIN 2.5* 2.4* 2.4* 2.5*    CBG: Recent Labs  Lab 03/20/24 1228 03/20/24 1802 03/21/24 0008 03/21/24 0508 03/21/24 1159  GLUCAP 98 95 88 77 122*     Recent Results (from the past 240 hours)  Blood Culture (routine x 2)     Status: None (Preliminary result)   Collection Time: 03/17/24 10:06 PM   Specimen: BLOOD  Result Value Ref Range Status   Specimen Description   Final    BLOOD BLOOD RIGHT ARM Performed at [facility], [address], [place], [place] [address]    Special Requests   Final    BOTTLES DRAWN AEROBIC ONLY Blood Culture results may not be optimal due to an inadequate volume of blood received in culture bottles Performed at [facility], [address] [place], [place] [address]    Culture   Final    NO GROWTH 3 DAYS Performed at [facility], [address] [place], [place] [address]    Report Status PENDING  Incomplete  Resp panel by  RT-PCR (RSV, Flu A&B, Covid) Anterior Nasal Swab     Status: None   Collection Time: 03/17/24 11:08 PM   Specimen: Anterior Nasal Swab  Result Value Ref Range Status   SARS Coronavirus 2 by RT PCR NEGATIVE NEGATIVE Final    Comment: (NOTE) SARS-CoV-2 target nucleic acids are NOT DETECTED.  The SARS-CoV-2 RNA is generally detectable in upper respiratory specimens during the acute phase of infection. The lowest concentration of SARS-CoV-2 viral copies this assay can detect is 138 copies/mL. A negative result does not preclude SARS-Cov-2 infection and should not be used as the sole basis for treatment or other patient management decisions. A negative result may occur with  improper specimen collection/handling, submission of specimen other than nasopharyngeal swab, presence of viral mutation(s) within the areas targeted by this assay, and inadequate number of viral copies(<138 copies/mL). A negative result must  be combined with clinical observations, patient history, and epidemiological information. The expected result is Negative.  Fact Sheet for Patients:  BloggerCourse.com  Fact Sheet for Healthcare Providers:  SeriousBroker.it  This test is no t yet approved or cleared by the [place]  FDA and  has been authorized for detection and/or diagnosis of SARS-CoV-2 by FDA under an Emergency Use Authorization (EUA). This EUA will remain  in effect (meaning this test can be used) for the duration of the COVID-19 declaration under Section 564(b)(1) of the Act, 21 U.S.C.section 360bbb-3(b)(1), unless the authorization is terminated  or revoked sooner.       Influenza A by PCR NEGATIVE NEGATIVE Final   Influenza B by PCR NEGATIVE NEGATIVE Final    Comment: (NOTE) The Xpert Xpress SARS-CoV-2/FLU/RSV plus assay is intended as an aid in the diagnosis of influenza from Nasopharyngeal swab specimens and should not be used as a sole basis for treatment. Nasal washings and aspirates are unacceptable for Xpert Xpress SARS-CoV-2/FLU/RSV testing.  Fact Sheet for Patients: BloggerCourse.com  Fact Sheet for Healthcare Providers: SeriousBroker.it  This test is not yet approved or cleared by the [place]  FDA and has been authorized for detection and/or diagnosis of SARS-CoV-2 by FDA under an Emergency Use Authorization (EUA). This EUA will remain in effect (meaning this test can be used) for the duration of the COVID-19 declaration under Section 564(b)(1) of the Act, 21 U.S.C. section 360bbb-3(b)(1), unless the authorization is terminated or revoked.     Resp Syncytial Virus by PCR NEGATIVE NEGATIVE Final    Comment: (NOTE) Fact Sheet for Patients: BloggerCourse.com  Fact Sheet for Healthcare Providers: SeriousBroker.it  This test is not  yet approved or cleared by the [place]  FDA and has been authorized for detection and/or diagnosis of SARS-CoV-2 by FDA under an Emergency Use Authorization (EUA). This EUA will remain in effect (meaning this test can be used) for the duration of the COVID-19 declaration under Section 564(b)(1) of the Act, 21 U.S.C. section 360bbb-3(b)(1), unless the authorization is terminated or revoked.  Performed at [facility], [address], [place], [place] [address]   Blood Culture (routine x 2)     Status: None (Preliminary result)   Collection Time: 03/17/24 11:35 PM   Specimen: BLOOD LEFT HAND  Result Value Ref Range Status   Specimen Description   Final    BLOOD LEFT HAND Performed at [facility], [address], [place], [place] [address]    Special Requests   Final    BOTTLES DRAWN AEROBIC ONLY Blood Culture results may not be optimal due to an  inadequate volume of blood received in culture bottles Performed at [facility], [address] [place], [place] [address]    Culture   Final    NO GROWTH 3 DAYS Performed at [facility], [address] [place], [place] [address]    Report Status PENDING  Incomplete  Urine Culture (for pregnant, neutropenic or urologic patients or patients with an indwelling urinary catheter)     Status: Abnormal   Collection Time: 03/18/24 12:04 AM   Specimen: Urine, Clean Catch  Result Value Ref Range Status   Specimen Description   Final    URINE, CLEAN CATCH Performed at [facility], [address] [place], [place] [address]    Special Requests   Final    NONE Performed at [facility], [address], [place], [place] [address]    Culture 60,000 COLONIES/mL ESCHERICHIA COLI (A)  Final   Report Status 03/21/2024 FINAL  Final   Organism ID, Bacteria ESCHERICHIA COLI (A)  Final      Susceptibility   Escherichia coli - MIC*    AMPICILLIN  >=32 RESISTANT Resistant     CEFAZOLIN  <=4 SENSITIVE Sensitive     CEFEPIME  <=0.12 SENSITIVE Sensitive     CEFTRIAXONE  <=0.25 SENSITIVE Sensitive     CIPROFLOXACIN  <=0.25 SENSITIVE Sensitive     GENTAMICIN 4 SENSITIVE Sensitive     IMIPENEM <=0.25 SENSITIVE Sensitive     NITROFURANTOIN 64 INTERMEDIATE Intermediate     TRIMETH/SULFA <=20 SENSITIVE Sensitive     AMPICILLIN/SULBACTAM 8 SENSITIVE Sensitive     PIP/TAZO <=4 SENSITIVE Sensitive ug/mL    * 60,000 COLONIES/mL ESCHERICHIA COLI  C Difficile Quick Screen w PCR reflex     Status: Abnormal   Collection Time: 03/18/24  1:44 AM   Specimen: STOOL  Result Value Ref Range Status   C Diff antigen POSITIVE (A) NEGATIVE Final   C Diff toxin NEGATIVE NEGATIVE Final   C Diff interpretation Results are indeterminate. See PCR results.  Final    Comment: Performed at [facility], [address], [place], [place] [address]  Gastrointestinal Panel by PCR , Stool     Status: None   Collection Time: 03/18/24  1:44 AM   Specimen: Stool  Result Value Ref Range Status   Campylobacter species NOT DETECTED NOT DETECTED Final   Plesimonas shigelloides NOT DETECTED NOT DETECTED Final   Salmonella species NOT DETECTED NOT DETECTED Final   Yersinia enterocolitica NOT DETECTED NOT DETECTED Final   Vibrio species NOT DETECTED NOT DETECTED Final   Vibrio cholerae NOT DETECTED NOT DETECTED Final   Enteroaggregative E coli (EAEC) NOT DETECTED NOT DETECTED Final   Enteropathogenic E coli (EPEC) NOT DETECTED NOT DETECTED Final   Enterotoxigenic E coli (ETEC) NOT DETECTED NOT DETECTED Final   Shiga like toxin producing E coli (STEC) NOT DETECTED NOT DETECTED Final   Shigella/Enteroinvasive E coli (EIEC) NOT DETECTED NOT DETECTED Final   Cryptosporidium NOT DETECTED NOT DETECTED Final   Cyclospora cayetanensis NOT DETECTED NOT DETECTED Final   Entamoeba histolytica NOT DETECTED NOT DETECTED Final   Giardia lamblia NOT DETECTED NOT DETECTED  Final   Adenovirus F40/41 NOT DETECTED NOT DETECTED Final   Astrovirus NOT DETECTED NOT DETECTED Final   Norovirus GI/GII NOT DETECTED NOT DETECTED Final   Rotavirus A NOT DETECTED NOT DETECTED Final   Sapovirus (I, II, IV, and V) NOT DETECTED NOT DETECTED Final    Comment: Performed at [facility], [address], [place], [place] [address]  C. Diff by PCR, Reflexed     Status: None   Collection Time: 03/18/24  1:44 AM  Result Value Ref Range Status   Toxigenic C. Difficile by PCR NEGATIVE NEGATIVE Final    Comment: Patient is colonized with non toxigenic C. difficile. May not need treatment unless significant symptoms are present. Performed at [facility], [address], [place], [place] [address]   MRSA Next Gen by PCR, Nasal     Status: None   Collection Time: 03/18/24  3:45 AM   Specimen: Nasal Mucosa; Nasal Swab  Result Value Ref Range Status   MRSA by PCR Next Gen NOT DETECTED NOT DETECTED Final    Comment: (NOTE) The GeneXpert MRSA Assay (FDA approved for NASAL specimens only), is one component of a comprehensive MRSA colonization surveillance program. It is not intended to diagnose MRSA infection nor to guide or monitor treatment for MRSA infections. Test performance is not FDA approved in patients less than 4 years old. Performed at [facility], [address], [place], [place] [address]          Radiology Studies: No results found.       Scheduled Meds:  bisoprolol   2.5 mg Oral Daily   buPROPion   75 mg Oral q morning   Chlorhexidine  Gluconate Cloth  6 each Topical Daily   cholecalciferol   1,000 Units Oral Daily   colestipol   1 g Oral BID   FLUoxetine   30 mg Oral QHS   gabapentin   300 mg Oral BID   insulin  aspart  0-15 Units Subcutaneous Q6H   levothyroxine   88 mcg Oral Q0600   pantoprazole   40 mg Oral BID   phosphorus  250 mg Oral BID   psyllium  1 packet Oral Daily   saccharomyces boulardii  250 mg Oral BID    sodium bicarbonate   650 mg Oral BID   Continuous Infusions:   ceFAZolin  (ANCEF ) IV 1 g (03/21/24 1126)     LOS: 3 days    Time spent: 40 minutes    Toribio Hummer, MD [facility]   To contact the attending provider between 7A-7P or the covering provider during after hours 7P-7A, please log into the web site [email or web address] and access using universal [place] password for that web site. If you do not have the password, please call the hospital operator.  03/21/2024, 1:16 PM

## 2024-03-21 NOTE — Plan of Care (Signed)

## 2024-03-21 NOTE — Consult Note (Signed)
 Physical Medicine and Rehabilitation Consult Reason for Consult: Evaluate appropriateness for Inpatient Rehab Referring Physician: Dr. Sebastian    HPI: [name] is a 78 y.o. female with PMHx of  has a past medical history of Allergy, Anxiety, Aortic atherosclerosis (HCC), Arthritis, Cataract, Chronic kidney disease, Chronic lower back pain, Clavicle fracture, COPD (chronic obstructive pulmonary disease) (HCC), Delusions (HCC), Depression, Dysrhythmia, Gastric polyp, GERD (gastroesophageal reflux disease), Hiatal hernia, History of blood transfusion (1970), HTN (hypertension), Hypercholesterolemia, Hypothyroid, Irritable bowel, Liver hemangioma, Migraine (1990s), Osteoporosis, Pancreatic divisum, Persistent atrial fibrillation (HCC) (06/27/2017), Pneumonia (01/2019), 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 . They were admitted to [facility] on 03/17/2024 for altered mental status and falls at home, without loss of consciousness or head trauma.  History also notable for recent diarrhea with prior history of C. difficile, hematuria on Eliquis , and fevers up to 103.  Workup significant for 60,000 colonies of E. coli on urinalysis, GI panel negative, C. difficile antigen positive/toxin negative.  She was treated for UTI with IV cefepime  and and cefazolin , it had a short course of vasopressors, and clinically improved.  Ultrasound with mild left hydronephrosis, urology recommended treatments for now and probable stent placement if no improvement.  Hospitalization was otherwise complicated by AKI which is improving, hypomagnesemia, hypokalemia, hypophosphatemia, metabolic acidosis, chronic atrial fibrillation, acute blood loss anemia, hypothyroidism, acute on chronic diarrhea possibly due to bile salt, and encephalopathy. PM&R was consulted to evaluate  appropriateness for IPR admission.   Per chart review, patient lives in a single-story condo with 4 steps to enter with her spouse.  At baseline, she uses a rollator for mobility in the home and reportedly had 4 falls since Sunday, but notable during prior hospitalization 6 - 6 - 25 she had reported no falls in the last 6 months.  She is modified independent for ADLs except coordination task such as buttoning clothes due to arthritis in her hands.  Strength has been reportedly manages driving, cooking, and cleaning.  Currently, she is max assist for lower body dressing min assist for bed mobility and mod assist for edge of bed.  She is mod assist for 1 day stand, attempting gait.  She is mod assist for sit to stand and she is limited by fatigue and ongoing incontinence/urgency of bowel and bladder.   Assessment/Plan: Diagnosis: Debility secondary to sepsis with recurrent UTI, chronic diarrhea/ C. difficile Does the need for close, 24 hr/day medical supervision in concert with the patient's rehab needs make it unreasonable for this patient to be served in a less intensive setting? Yes Co-Morbidities requiring supervision/potential complications: Recurrent UTI with hydronephrosis, AKI on CKD, chronic bilateral knee pain, hypomagnesemia, hypokalemia, hypophosphatemia, metabolic acidosis, chronic atrial fibrillation, acute blood loss anemia, hypothyroidism, acute on chronic diarrhea possibly due to bile salt, and encephalopathy Due to bladder management, bowel management, safety, skin/wound care, disease management, medication administration, pain management, and patient education, does the patient require 24 hr/day rehab nursing? Yes Does the patient require coordinated care of a physician, rehab nurse, therapy disciplines of PT, OT to address physical and functional deficits in the context of the above medical diagnosis(es)? Yes Addressing deficits in the following areas: balance, endurance, locomotion,  strength, transferring, bowel/bladder control, bathing, dressing, feeding, grooming, and toileting Can the patient actively participate in an intensive therapy program of at least 3 hrs of therapy per day  at least 5 days per week? Potentially The potential for patient to make measurable gains while on inpatient rehab is good Anticipated functional outcomes upon discharge from inpatient rehab are supervision  with PT, supervision to Min A with OT. Estimated rehab length of stay to reach the above functional goals is: 7-10 days Anticipated discharge destination: Home Overall Rehab/Functional Prognosis: good  POST ACUTE RECOMMENDATIONS: This patient's condition is appropriate for continued rehabilitative care in the following setting: CIR Patient has agreed to participate in recommended program. TBD Note that insurance prior authorization may be required for reimbursement for recommended care.  Comment: [name] is a 78 year old female who lives in a 1 floor condo with her husband, who provides intermittent physical assistance for upper body dressing/buttoning and driving.  Prior to the last few months, she was modified independent in the home with a rollator, but has recently been experiencing falls along with multiple recent hospitalizations for pneumonia, C. difficile, and recurrent UTI.    Will need to confirm the information above and home supports prior to pursuing IRF approval.  Given 24/7 assistance at home, it is reasonable that a short course of intensive therapy and inpatient rehab with PT/OT could bring her back to her functional baseline.  She will require close medical supervision for recurrent infections, along with incontinence/urgency of bowel and bladder but currently limits her functional abilities.  Once medically stable, she is potentially a good candidate for inpatient rehab.    Thanks,  Joesph JAYSON Likes, DO 03/21/2024

## 2024-03-22 DIAGNOSIS — D62 Acute posthemorrhagic anemia: Secondary | ICD-10-CM | POA: Diagnosis not present

## 2024-03-22 DIAGNOSIS — R579 Shock, unspecified: Secondary | ICD-10-CM | POA: Diagnosis not present

## 2024-03-22 DIAGNOSIS — N3001 Acute cystitis with hematuria: Secondary | ICD-10-CM | POA: Diagnosis not present

## 2024-03-22 DIAGNOSIS — G9341 Metabolic encephalopathy: Secondary | ICD-10-CM | POA: Diagnosis not present

## 2024-03-22 LAB — BASIC METABOLIC PANEL WITH GFR
Anion gap: 6 (ref 5–15)
BUN: 15 mg/dL (ref 8–23)
CO2: 21 mmol/L — ABNORMAL LOW (ref 22–32)
Calcium: 9 mg/dL (ref 8.9–10.3)
Chloride: 112 mmol/L — ABNORMAL HIGH (ref 98–111)
Creatinine, Ser: 1.16 mg/dL — ABNORMAL HIGH (ref 0.44–1.00)
GFR, Estimated: 48 mL/min — ABNORMAL LOW (ref >60)
Glucose, Bld: 133 mg/dL — ABNORMAL HIGH (ref 70–99)
Potassium: 3.2 mmol/L — ABNORMAL LOW (ref 3.5–5.1)
Sodium: 139 mmol/L (ref 135–145)

## 2024-03-22 LAB — GLUCOSE, CAPILLARY
Glucose-Capillary: 104 mg/dL — ABNORMAL HIGH (ref 70–99)
Glucose-Capillary: 108 mg/dL — ABNORMAL HIGH (ref 70–99)
Glucose-Capillary: 112 mg/dL — ABNORMAL HIGH (ref 70–99)
Glucose-Capillary: 159 mg/dL — ABNORMAL HIGH (ref 70–99)

## 2024-03-22 LAB — CBC
HCT: 37.1 % (ref 36.0–46.0)
Hemoglobin: 11.4 g/dL — ABNORMAL LOW (ref 12.0–15.0)
MCH: 27.7 pg (ref 26.0–34.0)
MCHC: 30.7 g/dL (ref 30.0–36.0)
MCV: 90.3 fL (ref 80.0–100.0)
Platelets: 213 K/uL (ref 150–400)
RBC: 4.11 MIL/uL (ref 3.87–5.11)
RDW: 15 % (ref 11.5–15.5)
WBC: 5.3 K/uL (ref 4.0–10.5)
nRBC: 0 % (ref 0.0–0.2)

## 2024-03-22 LAB — MAGNESIUM: Magnesium: 1.9 mg/dL (ref 1.7–2.4)

## 2024-03-22 MED ORDER — CEFAZOLIN SODIUM-DEXTROSE 1-4 GM/50ML-% IV SOLN
1.0000 g | Freq: Three times a day (TID) | INTRAVENOUS | Status: DC
  Filled 2024-03-22: qty 50

## 2024-03-22 MED ORDER — POTASSIUM CHLORIDE CRYS ER 20 MEQ PO TBCR
40.0000 meq | EXTENDED_RELEASE_TABLET | Freq: Once | ORAL | Status: AC
  Administered 2024-03-22: 40 meq via ORAL
  Filled 2024-03-22: qty 2

## 2024-03-22 MED ORDER — CEFADROXIL 500 MG PO CAPS
1000.0000 mg | ORAL_CAPSULE | Freq: Two times a day (BID) | ORAL | Status: AC
  Administered 2024-03-22 – 2024-03-25 (×8): 1000 mg via ORAL
  Filled 2024-03-22 (×8): qty 2

## 2024-03-22 MED ORDER — APIXABAN 5 MG PO TABS
5.0000 mg | ORAL_TABLET | Freq: Two times a day (BID) | ORAL | Status: DC
  Administered 2024-03-24 – 2024-03-25 (×4): 5 mg via ORAL
  Filled 2024-03-22 (×4): qty 1

## 2024-03-22 NOTE — Progress Notes (Signed)
 PHARMACY - PHYSICIAN COMMUNICATION CRITICAL VALUE ALERT - BLOOD CULTURE IDENTIFICATION (BCID)  [name] is an 78 y.o. female who presented to [facility] on 03/17/2024 with a chief complaint of abdominal pain, altered mental status, and fever.    Assessment:   7/28 BCx: 1 bottle out of one set of blood cultures with gram positive rods; BCID not run by Microbiology.  Name of physician (or Provider) Contacted: Sebastian  Current antibiotics: Cefadroxil  1000 mg po BID  Changes to prescribed antibiotics recommended:  No changes needed as culture growth likely a contaminant.    Eleanor Agent, PharmD, BCPS 03/22/2024  11:17 AM

## 2024-03-22 NOTE — Progress Notes (Signed)
 PROGRESS NOTE    [name]  FMW:[id number] DOB: [birth year] DOA: 03/17/2024 PCP: Avva, Ravisankar, MD    Chief Complaint  Patient presents with   Abdominal Pain   Altered Mental Status   Fever    Brief Narrative:  Per HPI 78 yo female presented today at the instance of her family after finding her more altered this evening. Pt reportedly has been falling recently, however daughter and husband at bedside endorses that pt has not had trauma with her weakness/falls. When pt falls she is usually standing up and slides down. Never loses consciousness. No head or other body trauma. Pt states the only discomfort she has is with her abdomen. Daughter states that pt has had multiple days of diarrhea (c/w her previous cdiff infections), pt states that she does have nausea but this is typical for her, denies emesis.    She states she has previously had dark stool but has not appreciated that since she stopped her Iron supplements. She does have hematuria and states that she passes clots from her urinary tract. She has seen urology as outpt and after cystoscopy they noted cracks in her bladder and she states that the urologist feels her hematuria is 2/2 eliquis  use as well. + temp at home 101-102. 103 at the hospital, but no other si/sx of sepsis (less her diarrhea). She denies any recent steroid use, no recent abx use.    Family feels as though pt's mental status is improving since her presentation. She is on 2mcg of norepi at this time. Gi panel and cdiff + ua pending.    PCCM was asked for admission 2/2 her need for low dose norepi.  Urology consulted due to concerns for left hydronephrosis.   Assessment & Plan:   Principal Problem:   Shock (HCC) Active Problems:   Hypokalemia   Acute cystitis   Persistent atrial fibrillation (HCC)   Anticoagulant long-term use   Acute encephalopathy   Hypomagnesemia   Hypothyroidism   Acute metabolic encephalopathy   Acute renal failure  superimposed on stage 3a chronic kidney disease (HCC)   Hypophosphatemia   Acute blood loss anemia   Anemia of chronic disease  #1 hypotension/shock -Likely multifactorial secondary to volume depletion in the setting of diarrhea acute on chronic, probable UTI. - Patient initially required pressors which have subsequently been weaned off.   - BP improved.   -Urine cultures with 60,000 colonies of E. coli. - GI pathogen panel negative, C. difficile PCR noted to be positive for C. difficile antigen, C. difficile toxin negative likely colonization.  - Fever curve trended down.   - Leukocytosis trended down.   - Vancomycin  has been discontinued.   - Discontinued Flagyl  and cefepime .   - Patient on IV cefazolin  and will transition to oral Duricef to complete course of antibiotic treatment.   - IV fluids discontinued.    2.  Acute cystitis/E. coli UTI -Urinalysis concerning for UTI. - Urine cultures with 60,000 colonies of E. coli and resistant to ampicillin but sensitive to the cephalosporins, fluoroquinolones, gentamicin, imipenem, Bactrim, Unasyn, Zosyn.   - Patient was on IV cefepime  and narrowed to IV cefazolin .   - Will transition from IV cefazolin  to oral Duricef to complete 7-day course of antibiotic treatment.  - Follow.  3.  AMS/acute encephalopathy - Improved with improvement with blood pressure and correction of metabolic derangements.  - Patient currently at baseline.   4.  Acute on chronic diarrhea -Patient with history of acute on  chronic diarrhea, noted to follow-up in the outpatient setting with Dr. Albertus and noted due to be seen in the office on 03/20/2024 per daughter.   - GI pathogen panel negative.   - C. difficile antigen positive, C. difficile toxin and PCR negative.   - Clinical improvement.   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   - Patient started on colestipol  and Metamucil.   - GI do not recommending treating patient for C. difficile at this time.   - GI trying to bulk her stool further with fiber.   - Outpatient follow-up with GI.   5.  Hematuria/left hydronephrosis/solitary kidney -Patient noted to have had some intermittent hematuria noted to have worsening and exacerbated by Eliquis  use as passes clots fairly regularly.   - Patient noted to have undergone a cystoscopy approximately a year ago being followed by Dr. Selma. - CT abdomen and pelvis as well as renal ultrasound with left hydronephrosis noted not completely new but worse from prior going all the way down to the UVJ.   - Patient seen in consultation by urology, who reviewed imaging and imaging without any evidence of acute obstruction and as such did not think any emergent intervention was indicated but are following closely.  - Renal function improving with hydration.   - Saline lock IV fluids - Per urology.  6.  AKI on CKD stage IIIa/status post right nephrectomy remote - Patient on presentation to be in acute kidney injury with a creatinine on presentation of 2.59. - Last creatinine noted of 1.55 (01/31/2024). - Likely secondary to prerenal azotemia as patient noted to be hypotensive on presentation. - Urinalysis turbid, large hemoglobin, large leukocytes, nitrite negative, many bacteria, WBC > 50. - Urine cultures with 60,000 colonies of E. coli - Renal ultrasound and CT abdomen and pelvis with noted mild left hydronephrosis to the level of the bladder trigone, no obstructing mass or calculi is identified. - Patient seen in consultation by urology who are recommending conservative treatments and if no improvement with renal function have recommended probable stent placement. - Renal function  slowly trending down currently at 1.16. - Saline lock IV fluids. - Avoid nephrotoxins.  7.  Hypomagnesemia/hypokalemia/hypophosphatemia - Potassium currently at 3.2, magnesium  at 1.9, phosphorus at 3.1. - Status post K-Phos.   - K-Dur 40 mEq p.o. x 1.  - Repeat labs in AM.  8.  NAGMA -Improving. - Continue bicarb tablets.  - Follow.  9.  Chronic A-fib -Patient in A-fib however rate currently controlled. - Resume home regimen bisoprolol  for rate control. - Anticoagulation on hold due to presentation with hematuria, anemia requiring transfusion of 2 units PRBCs on admission, concern for possible urological procedure. - Discussed with urology and if no hematuria noted will resume home regimen Eliquis  on Monday, 03/24/2024.   10.  Acute blood loss anemia/anemia of chronic disease -Noted to have presented with hypotension, hematuria with hemoglobin dropping as low as 6.8 on presentation. - Status post Kcentra  on presentation to the ED and transfusion of 2 units PRBCs with hemoglobin currently at 11.4. -Follow H&H. - Transfusion threshold hemoglobin < 7.  11.  Hypothyroidism - Continue Synthroid .       DVT prophylaxis: SCDs.  Code Status: Full Family Communication: Updated patient, son and daughter-in-law at bedside.  Disposition: CIR when bed available.   Status is: Inpatient Remains inpatient appropriate because: Severity of illness   Consultants:  Urology: Dr. Lovie 03/18/2024 Gastroenterology  Procedures:  CT abdomen and pelvis 03/18/2024 Chest x-ray 03/17/2024 Renal ultrasound 03/18/2024 2D echo 03/18/2024 Transfusion 2 units PRBC 03/18/2024  Antimicrobials:  Anti-infectives (From admission, onward)    Start     Dose/Rate Route Frequency Ordered Stop   03/22/24 1015  cefadroxil  (DURICEF) capsule 1,000 mg        1,000 mg Oral 2 times daily 03/22/24 0927 03/26/24 0959   03/22/24 0900  ceFAZolin  (ANCEF ) IVPB 1 g/50 mL premix  Status:  Discontinued        1 g 100 mL/hr  over 30 Minutes Intravenous Every 8 hours 03/22/24 0807 03/22/24 0927   03/20/24 2200  ceFAZolin  (ANCEF ) IVPB 1 g/50 mL premix  Status:  Discontinued        1 g 100 mL/hr over 30 Minutes Intravenous Every 12 hours 03/20/24 1458 03/22/24 0807   03/20/24 0000  vancomycin  (VANCOREADY) IVPB 750 mg/150 mL  Status:  Discontinued        750 mg 150 mL/hr over 60 Minutes Intravenous Every 48 hours 03/18/24 0316 03/18/24 1407   03/18/24 2200  ceFEPIme  (MAXIPIME ) 2 g in sodium chloride  0.9 % 100 mL IVPB  Status:  Discontinued        2 g 200 mL/hr over 30 Minutes Intravenous Every 24 hours 03/18/24 0314 03/20/24 1458   03/18/24 1000  vancomycin  (VANCOCIN ) capsule 125 mg  Status:  Discontinued        125 mg Oral 4 times daily 03/18/24 0308 03/19/24 2143   03/18/24 1000  metroNIDAZOLE  (FLAGYL ) IVPB 500 mg  Status:  Discontinued        500 mg 100 mL/hr over 60 Minutes Intravenous Every 12 hours 03/18/24 0314 03/20/24 0929   03/18/24 0400  metroNIDAZOLE  (FLAGYL ) IVPB 500 mg  Status:  Discontinued        500 mg 100 mL/hr over 60 Minutes Intravenous  Once 03/18/24 0308 03/18/24 0314   03/18/24 0400  ceFEPIme  (MAXIPIME ) 2 g in sodium chloride  0.9 % 100 mL IVPB  Status:  Discontinued        2 g 200 mL/hr over 30 Minutes Intravenous  Once 03/18/24 0308 03/18/24 0314   03/17/24 2330  vancomycin  (VANCOCIN ) IVPB 1000 mg/200 mL premix        1,000 mg 200 mL/hr over 60 Minutes Intravenous  Once 03/17/24 2322 03/18/24 0238   03/17/24 2215  ceFEPIme  (MAXIPIME ) 2 g in sodium chloride  0.9 % 100 mL IVPB        2 g 200 mL/hr over 30 Minutes Intravenous  Once 03/17/24 2208 03/17/24 2250   03/17/24 2215  metroNIDAZOLE  (FLAGYL ) IVPB 500 mg        500 mg 100 mL/hr over 60 Minutes Intravenous  Once 03/17/24 2208 03/18/24 0005         Subjective: Patient lying in bed.  Son and daughter-in-law at bedside.  Patient denies any chest pain or shortness of breath.  No abdominal pain.  Stated that loose stools this morning.   Overall feeling better than she did.   Objective: Vitals:   03/21/24 1958 03/22/24 0449 03/22/24 0500 03/22/24 1300  BP: 135/78 122/83  (!) 141/86  Pulse: 67 89    Resp: 20 16    Temp: 98.7 F (37.1 C) 98.5 F (  36.9 C)  99.3 F (37.4 C)  TempSrc: Oral Oral  Oral  SpO2: 96% 98%  99%  Weight:   66.4 kg   Height:        Intake/Output Summary (Last 24 hours) at 03/22/2024 1551 Last data filed at 03/22/2024 1500 Gross per 24 hour  Intake 240 ml  Output 700 ml  Net -460 ml   Filed Weights   03/20/24 0500 03/21/24 0500 03/22/24 0500  Weight: 66 kg 66.4 kg 66.4 kg    Examination:  General exam: NAD Respiratory system: Lungs clear to auscultation bilaterally.  No wheezes, no crackles, no rhonchi.  Fair air movement.  Speaking in full sentences.   Cardiovascular system: Irregularly irregular.  No JVD, no murmurs rubs or gallops.  No pitting lower extremity edema.  Gastrointestinal system: Abdomen is soft, nontender, nondistended, positive bowel sounds.  No rebound.  No guarding.  Central nervous system: Alert and oriented. No focal neurological deficits. Extremities: Symmetric 5 x 5 power. Skin: No rashes, lesions or ulcers Psychiatry: Judgement and insight appear normal. Mood & affect appropriate.     Data Reviewed: I have personally reviewed following labs and imaging studies  CBC: Recent Labs  Lab 03/17/24 2206 03/18/24 1014 03/18/24 1821 03/19/24 0139 03/19/24 0726 03/19/24 1558 03/20/24 0725 03/21/24 0427 03/22/24 0501  WBC 10.3 11.7*  --  6.4  --   --  5.2 6.0 5.3  NEUTROABS 8.8* 10.3*  --   --   --   --  3.5 4.0  --   HGB 6.8* 12.2   < > 10.8* 11.2* 11.4* 11.0* 11.0* 11.4*  HCT 23.2* 38.3   < > 35.0* 35.7* 36.5 35.0* 36.5 37.1  MCV 92.1 87.2  --  89.5  --   --  89.5 90.1 90.3  PLT 158 222  --  175  --   --  195 209 213   < > = values in this interval not displayed.    Basic Metabolic Panel: Recent Labs  Lab 03/18/24 1014 03/19/24 0139 03/20/24 0725  03/21/24 0427 03/22/24 0501  NA 134* 133* 134* 140 139  K 4.4 4.7 4.0 3.8 3.2*  CL 109 109 108 113* 112*  CO2 20* 18* 17* 18* 21*  GLUCOSE 115* 116* 99 92 133*  BUN 34* 29* 23 19 15   CREATININE 2.24* 1.97* 1.82* 1.47* 1.16*  CALCIUM  9.4 9.0 9.1 9.4 9.0  MG 3.5* 2.6* 2.0 1.7 1.9  PHOS  --  2.4* 2.7 3.1  --     GFR: Estimated Creatinine Clearance: 37.4 mL/min (A) (by C-G formula based on SCr of 1.16 mg/dL (H)).  Liver Function Tests: Recent Labs  Lab 03/17/24 2206 03/19/24 0139 03/20/24 0725 03/21/24 0427  AST 14* 15  --   --   ALT 8 16  --   --   ALKPHOS 48 57  --   --   BILITOT 0.9 1.1  --   --   PROT 5.0* 5.1*  --   --   ALBUMIN 2.5* 2.4* 2.4* 2.5*    CBG: Recent Labs  Lab 03/21/24 1159 03/21/24 1823 03/22/24 0003 03/22/24 0615 03/22/24 1244  GLUCAP 122* 119* 112* 108* 104*     Recent Results (from the past 240 hours)  Blood Culture (routine x 2)     Status: None (Preliminary result)   Collection Time: 03/17/24 10:06 PM   Specimen: BLOOD  Result Value Ref Range Status   Specimen Description   Final  BLOOD BLOOD RIGHT ARM Performed at [facility], [address], [place], [place] [address]    Special Requests   Final    BOTTLES DRAWN AEROBIC ONLY Blood Culture results may not be optimal due to an inadequate volume of blood received in culture bottles Performed at [facility], [address], [place], [place] [address]    Culture  Setup Time   Final    GRAM POSITIVE RODS AEROBIC BOTTLE ONLY CRITICAL RESULT CALLED TO, READ BACK BY AND VERIFIED WITH: PHARMD MELISSA J. [id number] AT 1106, ADC Performed at [facility], [address], [place], [place] [address]    Culture GRAM POSITIVE RODS  Final   Report Status PENDING  Incomplete  Resp panel by RT-PCR (RSV, Flu A&B, Covid) Anterior Nasal Swab     Status: None   Collection Time: 03/17/24 11:08 PM   Specimen: Anterior Nasal Swab  Result Value Ref Range  Status   SARS Coronavirus 2 by RT PCR NEGATIVE NEGATIVE Final    Comment: (NOTE) SARS-CoV-2 target nucleic acids are NOT DETECTED.  The SARS-CoV-2 RNA is generally detectable in upper respiratory specimens during the acute phase of infection. The lowest concentration of SARS-CoV-2 viral copies this assay can detect is 138 copies/mL. A negative result does not preclude SARS-Cov-2 infection and should not be used as the sole basis for treatment or other patient management decisions. A negative result may occur with  improper specimen collection/handling, submission of specimen other than nasopharyngeal swab, presence of viral mutation(s) within the areas targeted by this assay, and inadequate number of viral copies(<138 copies/mL). A negative result must be combined with clinical observations, patient history, and epidemiological information. The expected result is Negative.  Fact Sheet for Patients:  BloggerCourse.com  Fact Sheet for Healthcare Providers:  SeriousBroker.it  This test is no t yet approved or cleared by the [place]  FDA and  has been authorized for detection and/or diagnosis of SARS-CoV-2 by FDA under an Emergency Use Authorization (EUA). This EUA will remain  in effect (meaning this test can be used) for the duration of the COVID-19 declaration under Section 564(b)(1) of the Act, 21 U.S.C.section 360bbb-3(b)(1), unless the authorization is terminated  or revoked sooner.       Influenza A by PCR NEGATIVE NEGATIVE Final   Influenza B by PCR NEGATIVE NEGATIVE Final    Comment: (NOTE) The Xpert Xpress SARS-CoV-2/FLU/RSV plus assay is intended as an aid in the diagnosis of influenza from Nasopharyngeal swab specimens and should not be used as a sole basis for treatment. Nasal washings and aspirates are unacceptable for Xpert Xpress SARS-CoV-2/FLU/RSV testing.  Fact Sheet for  Patients: BloggerCourse.com  Fact Sheet for Healthcare Providers: SeriousBroker.it  This test is not yet approved or cleared by the [place]  FDA and has been authorized for detection and/or diagnosis of SARS-CoV-2 by FDA under an Emergency Use Authorization (EUA). This EUA will remain in effect (meaning this test can be used) for the duration of the COVID-19 declaration under Section 564(b)(1) of the Act, 21 U.S.C. section 360bbb-3(b)(1), unless the authorization is terminated or revoked.     Resp Syncytial Virus by PCR NEGATIVE NEGATIVE Final    Comment: (NOTE) Fact Sheet for Patients: BloggerCourse.com  Fact Sheet for Healthcare Providers: SeriousBroker.it  This test is not yet approved or cleared by the [place]  FDA and has been authorized for detection and/or diagnosis of SARS-CoV-2 by FDA under an Emergency Use Authorization (EUA). This EUA will  remain in effect (meaning this test can be used) for the duration of the COVID-19 declaration under Section 564(b)(1) of the Act, 21 U.S.C. section 360bbb-3(b)(1), unless the authorization is terminated or revoked.  Performed at [facility], [address] [place], [place] [address]   Blood Culture (routine x 2)     Status: None (Preliminary result)   Collection Time: 03/17/24 11:35 PM   Specimen: BLOOD LEFT HAND  Result Value Ref Range Status   Specimen Description   Final    BLOOD LEFT HAND Performed at [facility], [address], [place], [place] [address]    Special Requests   Final    BOTTLES DRAWN AEROBIC ONLY Blood Culture results may not be optimal due to an inadequate volume of blood received in culture bottles Performed at [facility], [address] [place], [place] [address]    Culture   Final    NO GROWTH 4 DAYS Performed at [facility], [address], [place], [place] [address]    Report Status PENDING  Incomplete  Urine Culture (for pregnant, neutropenic or urologic patients or patients with an indwelling urinary catheter)     Status: Abnormal   Collection Time: 03/18/24 12:04 AM   Specimen: Urine, Clean Catch  Result Value Ref Range Status   Specimen Description   Final    URINE, CLEAN CATCH Performed at [facility], [address], [place], [place] [address]    Special Requests   Final    NONE Performed at [facility], [address], [place], [place] [address]    Culture 60,000 COLONIES/mL ESCHERICHIA COLI (A)  Final   Report Status 03/21/2024 FINAL  Final   Organism ID, Bacteria ESCHERICHIA COLI (A)  Final      Susceptibility   Escherichia coli - MIC*    AMPICILLIN >=32 RESISTANT Resistant     CEFAZOLIN  <=4 SENSITIVE Sensitive     CEFEPIME  <=0.12 SENSITIVE Sensitive     CEFTRIAXONE  <=0.25 SENSITIVE Sensitive     CIPROFLOXACIN  <=0.25 SENSITIVE Sensitive     GENTAMICIN 4 SENSITIVE Sensitive     IMIPENEM <=0.25 SENSITIVE Sensitive     NITROFURANTOIN 64 INTERMEDIATE Intermediate     TRIMETH/SULFA <=20 SENSITIVE Sensitive     AMPICILLIN/SULBACTAM 8 SENSITIVE Sensitive     PIP/TAZO <=4 SENSITIVE Sensitive ug/mL    * 60,000 COLONIES/mL ESCHERICHIA COLI  C Difficile Quick Screen w PCR reflex     Status: Abnormal   Collection Time: 03/18/24  1:44 AM   Specimen: STOOL  Result Value Ref Range Status   C Diff antigen POSITIVE (A) NEGATIVE Final   C Diff toxin NEGATIVE NEGATIVE Final   C Diff interpretation Results are indeterminate. See PCR results.  Final    Comment: Performed at [facility], [address], [place], [place] [address]  Gastrointestinal Panel by PCR , Stool     Status: None   Collection Time: 03/18/24  1:44 AM   Specimen: Stool  Result Value Ref Range Status   Campylobacter species NOT DETECTED NOT DETECTED Final   Plesimonas  shigelloides NOT DETECTED NOT DETECTED Final   Salmonella species NOT DETECTED NOT DETECTED Final   Yersinia enterocolitica NOT DETECTED NOT DETECTED Final   Vibrio species NOT DETECTED NOT DETECTED Final   Vibrio cholerae NOT DETECTED NOT DETECTED Final   Enteroaggregative E coli (EAEC) NOT DETECTED NOT DETECTED Final   Enteropathogenic E coli (EPEC)  NOT DETECTED NOT DETECTED Final   Enterotoxigenic E coli (ETEC) NOT DETECTED NOT DETECTED Final   Shiga like toxin producing E coli (STEC) NOT DETECTED NOT DETECTED Final   Shigella/Enteroinvasive E coli (EIEC) NOT DETECTED NOT DETECTED Final   Cryptosporidium NOT DETECTED NOT DETECTED Final   Cyclospora cayetanensis NOT DETECTED NOT DETECTED Final   Entamoeba histolytica NOT DETECTED NOT DETECTED Final   Giardia lamblia NOT DETECTED NOT DETECTED Final   Adenovirus F40/41 NOT DETECTED NOT DETECTED Final   Astrovirus NOT DETECTED NOT DETECTED Final   Norovirus GI/GII NOT DETECTED NOT DETECTED Final   Rotavirus A NOT DETECTED NOT DETECTED Final   Sapovirus (I, II, IV, and V) NOT DETECTED NOT DETECTED Final    Comment: Performed at [facility], [address], [place], [place] [address]  C. Diff by PCR, Reflexed     Status: None   Collection Time: 03/18/24  1:44 AM  Result Value Ref Range Status   Toxigenic C. Difficile by PCR NEGATIVE NEGATIVE Final    Comment: Patient is colonized with non toxigenic C. difficile. May not need treatment unless significant symptoms are present. Performed at [facility], [address], [place], [place] [address]   MRSA Next Gen by PCR, Nasal     Status: None   Collection Time: 03/18/24  3:45 AM   Specimen: Nasal Mucosa; Nasal Swab  Result Value Ref Range Status   MRSA by PCR Next Gen NOT DETECTED NOT DETECTED Final    Comment: (NOTE) The GeneXpert MRSA Assay (FDA approved for NASAL specimens only), is one component of a comprehensive MRSA colonization surveillance program. It is not  intended to diagnose MRSA infection nor to guide or monitor treatment for MRSA infections. Test performance is not FDA approved in patients less than 43 years old. Performed at [facility], [address] [place], [place] [address]          Radiology Studies: No results found.       Scheduled Meds:  [START ON 03/24/2024] apixaban   5 mg Oral BID   bisoprolol   2.5 mg Oral Daily   buPROPion   75 mg Oral q morning   cefadroxil   1,000 mg Oral BID   Chlorhexidine  Gluconate Cloth  6 each Topical Daily   cholecalciferol   1,000 Units Oral Daily   colestipol   1 g Oral BID   FLUoxetine   30 mg Oral QHS   gabapentin   300 mg Oral BID   insulin  aspart  0-15 Units Subcutaneous Q6H   levothyroxine   88 mcg Oral Q0600   pantoprazole   40 mg Oral BID   psyllium  1 packet Oral Daily   saccharomyces boulardii  250 mg Oral BID   sodium bicarbonate   650 mg Oral BID   Continuous Infusions:     LOS: 4 days    Time spent: 35 minutes    Toribio Hummer, MD [facility]   To contact the attending provider between 7A-7P or the covering provider during after hours 7P-7A, please log into the web site [email or web address] and access using universal [place] password for that web site. If you do not have the password, please call the hospital operator.  03/22/2024, 3:51 PM

## 2024-03-22 NOTE — Plan of Care (Signed)

## 2024-03-22 NOTE — Progress Notes (Addendum)
 Patient requested that I listen to her bowel sounds after she had got done eating her dinner. Patient states that after she eats, her stomach makes loud growling noises that last for ~15 minutes at the least. Upon assessing the patients bowel sounds with a stethoscope, the patient did have very loud and audible growling in all stomach quadrants. After removing the stethoscope, the growling was audible standing a few feet away from the patient. Patient states that there's no pain or discomfort with the growling, but that it was becoming annoying. Patient was told that I would document the findings and make the attending MD aware.  [name], BSN, RN 03/22/24 6:45 PM

## 2024-03-23 DIAGNOSIS — R579 Shock, unspecified: Secondary | ICD-10-CM | POA: Diagnosis not present

## 2024-03-23 DIAGNOSIS — D62 Acute posthemorrhagic anemia: Secondary | ICD-10-CM | POA: Diagnosis not present

## 2024-03-23 DIAGNOSIS — N3001 Acute cystitis with hematuria: Secondary | ICD-10-CM | POA: Diagnosis not present

## 2024-03-23 DIAGNOSIS — G9341 Metabolic encephalopathy: Secondary | ICD-10-CM | POA: Diagnosis not present

## 2024-03-23 LAB — GLUCOSE, CAPILLARY
Glucose-Capillary: 159 mg/dL — ABNORMAL HIGH (ref 70–99)
Glucose-Capillary: 84 mg/dL (ref 70–99)
Glucose-Capillary: 88 mg/dL (ref 70–99)
Glucose-Capillary: 91 mg/dL (ref 70–99)

## 2024-03-23 LAB — HEMOGLOBIN AND HEMATOCRIT, BLOOD
HCT: 36.5 % (ref 36.0–46.0)
Hemoglobin: 11.2 g/dL — ABNORMAL LOW (ref 12.0–15.0)

## 2024-03-23 LAB — RENAL FUNCTION PANEL
Albumin: 2.4 g/dL — ABNORMAL LOW (ref 3.5–5.0)
Anion gap: 7 (ref 5–15)
BUN: 12 mg/dL (ref 8–23)
CO2: 22 mmol/L (ref 22–32)
Calcium: 9.1 mg/dL (ref 8.9–10.3)
Chloride: 112 mmol/L — ABNORMAL HIGH (ref 98–111)
Creatinine, Ser: 1.07 mg/dL — ABNORMAL HIGH (ref 0.44–1.00)
GFR, Estimated: 53 mL/min — ABNORMAL LOW (ref >60)
Glucose, Bld: 85 mg/dL (ref 70–99)
Phosphorus: 2.7 mg/dL (ref 2.5–4.6)
Potassium: 4 mmol/L (ref 3.5–5.1)
Sodium: 141 mmol/L (ref 135–145)

## 2024-03-23 LAB — MAGNESIUM: Magnesium: 1.6 mg/dL — ABNORMAL LOW (ref 1.7–2.4)

## 2024-03-23 LAB — CULTURE, BLOOD (ROUTINE X 2): Culture: NO GROWTH

## 2024-03-23 MED ORDER — MAGNESIUM SULFATE 4 GM/100ML IV SOLN
4.0000 g | Freq: Once | INTRAVENOUS | Status: AC
  Administered 2024-03-23: 4 g via INTRAVENOUS
  Filled 2024-03-23: qty 100

## 2024-03-23 NOTE — Plan of Care (Signed)
  Problem: Coping: Goal: Ability to adjust to condition or change in health will improve Outcome: Progressing   Problem: Fluid Volume: Goal: Ability to maintain a balanced intake and output will improve Outcome: Progressing   Problem: Health Behavior/Discharge Planning: Goal: Ability to manage health-related needs will improve Outcome: Progressing   Problem: Metabolic: Goal: Ability to maintain appropriate glucose levels will improve Outcome: Progressing   Problem: Nutritional: Goal: Maintenance of adequate nutrition will improve Outcome: Progressing   Problem: Tissue Perfusion: Goal: Adequacy of tissue perfusion will improve Outcome: Progressing   Problem: Clinical Measurements: Goal: Ability to maintain clinical measurements within normal limits will improve Outcome: Progressing Goal: Will remain free from infection Outcome: Progressing Goal: Diagnostic test results will improve Outcome: Progressing Goal: Respiratory complications will improve Outcome: Progressing Goal: Cardiovascular complication will be avoided Outcome: Progressing   Problem: Nutrition: Goal: Adequate nutrition will be maintained Outcome: Progressing   Problem: Coping: Goal: Level of anxiety will decrease Outcome: Progressing   Problem: Elimination: Goal: Will not experience complications related to bowel motility Outcome: Progressing Goal: Will not experience complications related to urinary retention Outcome: Progressing   Problem: Pain Managment: Goal: General experience of comfort will improve and/or be controlled Outcome: Progressing   Problem: Safety: Goal: Ability to remain free from injury will improve Outcome: Progressing   Problem: Skin Integrity: Goal: Risk for impaired skin integrity will decrease Outcome: Progressing

## 2024-03-23 NOTE — Progress Notes (Signed)
 PROGRESS NOTE    [name]  FMW:[id number] DOB: 16-Aug-1946 DOA: 03/17/2024 PCP: Avva, Ravisankar, MD    Chief Complaint  Patient presents with   Abdominal Pain   Altered Mental Status   Fever    Brief Narrative:  Per HPI 78 yo female presented today at the instance of her family after finding her more altered this evening. Pt reportedly has been falling recently, however daughter and husband at bedside endorses that pt has not had trauma with her weakness/falls. When pt falls she is usually standing up and slides down. Never loses consciousness. No head or other body trauma. Pt states the only discomfort she has is with her abdomen. Daughter states that pt has had multiple days of diarrhea (c/w her previous cdiff infections), pt states that she does have nausea but this is typical for her, denies emesis.    She states she has previously had dark stool but has not appreciated that since she stopped her Iron supplements. She does have hematuria and states that she passes clots from her urinary tract. She has seen urology as outpt and after cystoscopy they noted cracks in her bladder and she states that the urologist feels her hematuria is 2/2 eliquis  use as well. + temp at home 101-102. 103 at the hospital, but no other si/sx of sepsis (less her diarrhea). She denies any recent steroid use, no recent abx use.    Family feels as though pt's mental status is improving since her presentation. She is on 2mcg of norepi at this time. Gi panel and cdiff + ua pending.    PCCM was asked for admission 2/2 her need for low dose norepi.  Urology consulted due to concerns for left hydronephrosis.   Assessment & Plan:   Principal Problem:   Shock (HCC) Active Problems:   Hypokalemia   Acute cystitis   Persistent atrial fibrillation (HCC)   Anticoagulant long-term use   Acute encephalopathy   Hypomagnesemia   Hypothyroidism   Acute metabolic encephalopathy   Acute renal failure  superimposed on stage 3a chronic kidney disease (HCC)   Hypophosphatemia   Acute blood loss anemia   Anemia of chronic disease  #1 hypotension/shock -Likely multifactorial secondary to volume depletion in the setting of diarrhea acute on chronic, probable UTI. - Patient initially required pressors which have subsequently been weaned off.   - BP improved.   -Urine cultures with 60,000 colonies of E. coli. - GI pathogen panel negative, C. difficile PCR noted to be positive for C. difficile antigen, C. difficile toxin negative likely colonization.  - Fever curve trended down.   - Leukocytosis trended down.   - Vancomycin  has been discontinued.   - Discontinued Flagyl  and cefepime .   - Patient was on IV cefazolin  and has been transition to Duricef to complete a course of antibiotic treatment.  - IV fluids discontinued.    2.  Acute cystitis/E. coli UTI -Urinalysis concerning for UTI. - Urine cultures with 60,000 colonies of E. coli and resistant to ampicillin but sensitive to the cephalosporins, fluoroquinolones, gentamicin, imipenem, Bactrim, Unasyn, Zosyn.   - Patient was on IV cefepime  and narrowed to IV cefazolin .   - Patient has been transition from IV cefazolin  to oral Duricef to complete a 7-day course of antibiotic treatment.  - Follow.  3.  AMS/acute encephalopathy - Improved with improvement with blood pressure and correction of metabolic derangements.  - Patient currently at baseline.   4.  Acute on chronic diarrhea -Patient with  history of acute on chronic diarrhea, noted to follow-up in the outpatient setting with Dr. Albertus and noted due to be seen in the office on 03/20/2024 per daughter.   - GI pathogen panel negative.   - C. difficile antigen positive, C. difficile toxin and PCR negative.   - Clinical improvement.   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   - Patient started on colestipol  and Metamucil.   - GI do not recommending treating patient for C. difficile at this time.   - GI trying to bulk her stool further with fiber.   - Outpatient follow-up with GI.   5.  Hematuria/left hydronephrosis/solitary kidney -Patient noted to have had some intermittent hematuria noted to have worsening and exacerbated by Eliquis  use as passes clots fairly regularly.   - Patient noted to have undergone a cystoscopy approximately a year ago being followed by Dr. Selma. - CT abdomen and pelvis as well as renal ultrasound with left hydronephrosis noted not completely new but worse from prior going all the way down to the UVJ.   - Patient seen in consultation by urology, who reviewed imaging and imaging without any evidence of acute obstruction and as such did not think any emergent intervention was indicated but are following closely.  - Renal function improved with hydration.  - Saline lock IV fluids - Per urology. - Outpatient follow-up with urology.  6.  AKI on CKD stage IIIa/status post right nephrectomy remote - Patient on presentation to be in acute kidney injury with a creatinine on presentation of 2.59. - Last creatinine noted of 1.55 (01/31/2024). - Likely secondary to prerenal azotemia as patient noted to be hypotensive on presentation. - Urinalysis turbid, large hemoglobin, large leukocytes, nitrite negative, many bacteria, WBC > 50. - Urine cultures with 60,000 colonies of E. coli - Renal ultrasound and CT abdomen and pelvis with noted mild left hydronephrosis to the level of the bladder trigone, no obstructing mass or calculi is identified. - Patient seen in consultation by urology who are recommending conservative treatments and if no improvement with renal function have  recommended probable stent placement. - Renal function improved with hydration and creatinine currently at 1.07 -IV fluids have been discontinued.  - Avoid nephrotoxins.  7.  Hypomagnesemia/hypokalemia/hypophosphatemia - Potassium currently at 4.0, magnesium  at 1.6, phosphorus at 2.7. - Status post K-Phos.   - Magnesium  sulfate 4 g IV x 1.  - Repeat labs in AM.  8.  NAGMA -Improving. - Continue bicarb tablets.  - Follow.  9.  Chronic A-fib -Patient in A-fib however rate currently controlled. - Resume home regimen bisoprolol  for rate control. - Anticoagulation on hold due to presentation with hematuria, anemia requiring transfusion of 2 units PRBCs on admission, concern for possible urological procedure. - Discussed with urology and if no hematuria noted will resume home regimen Eliquis  on Monday, 03/24/2024.   10.  Acute blood loss anemia/anemia of chronic disease -Noted to have presented with hypotension, hematuria with hemoglobin dropping as low as 6.8 on presentation. - Status post Kcentra  on presentation to the ED and transfusion of 2 units PRBCs with hemoglobin currently at 11.2. -Follow H&H. - Transfusion threshold hemoglobin < 7.  11.  Hypothyroidism -  Synthroid .      DVT prophylaxis: SCDs. Code Status: Full Family Communication: Updated patient, husband at bedside. Disposition: CIR when bed available.   Status is: Inpatient Remains inpatient appropriate because: Severity of illness   Consultants:  Urology: Dr. Lovie 03/18/2024 Gastroenterology  Procedures:  CT abdomen and pelvis 03/18/2024 Chest x-ray 03/17/2024 Renal ultrasound 03/18/2024 2D echo 03/18/2024 Transfusion 2 units PRBC 03/18/2024  Antimicrobials:  Anti-infectives (From admission, onward)    Start     Dose/Rate Route Frequency Ordered Stop   03/22/24 1015  cefadroxil  (DURICEF) capsule 1,000 mg        1,000 mg Oral 2 times daily 03/22/24 0927 03/26/24 0959   03/22/24 0900  ceFAZolin  (ANCEF ) IVPB 1  g/50 mL premix  Status:  Discontinued        1 g 100 mL/hr over 30 Minutes Intravenous Every 8 hours 03/22/24 0807 03/22/24 0927   03/20/24 2200  ceFAZolin  (ANCEF ) IVPB 1 g/50 mL premix  Status:  Discontinued        1 g 100 mL/hr over 30 Minutes Intravenous Every 12 hours 03/20/24 1458 03/22/24 0807   03/20/24 0000  vancomycin  (VANCOREADY) IVPB 750 mg/150 mL  Status:  Discontinued        750 mg 150 mL/hr over 60 Minutes Intravenous Every 48 hours 03/18/24 0316 03/18/24 1407   03/18/24 2200  ceFEPIme  (MAXIPIME ) 2 g in sodium chloride  0.9 % 100 mL IVPB  Status:  Discontinued        2 g 200 mL/hr over 30 Minutes Intravenous Every 24 hours 03/18/24 0314 03/20/24 1458   03/18/24 1000  vancomycin  (VANCOCIN ) capsule 125 mg  Status:  Discontinued        125 mg Oral 4 times daily 03/18/24 0308 03/19/24 2143   03/18/24 1000  metroNIDAZOLE  (FLAGYL ) IVPB 500 mg  Status:  Discontinued        500 mg 100 mL/hr over 60 Minutes Intravenous Every 12 hours 03/18/24 0314 03/20/24 0929   03/18/24 0400  metroNIDAZOLE  (FLAGYL ) IVPB 500 mg  Status:  Discontinued        500 mg 100 mL/hr over 60 Minutes Intravenous  Once 03/18/24 0308 03/18/24 0314   03/18/24 0400  ceFEPIme  (MAXIPIME ) 2 g in sodium chloride  0.9 % 100 mL IVPB  Status:  Discontinued        2 g 200 mL/hr over 30 Minutes Intravenous  Once 03/18/24 0308 03/18/24 0314   03/17/24 2330  vancomycin  (VANCOCIN ) IVPB 1000 mg/200 mL premix        1,000 mg 200 mL/hr over 60 Minutes Intravenous  Once 03/17/24 2322 03/18/24 0238   03/17/24 2215  ceFEPIme  (MAXIPIME ) 2 g in sodium chloride  0.9 % 100 mL IVPB        2 g 200 mL/hr over 30 Minutes Intravenous  Once 03/17/24 2208 03/17/24 2250   03/17/24 2215  metroNIDAZOLE  (FLAGYL ) IVPB 500 mg        500 mg 100 mL/hr over 60 Minutes Intravenous  Once 03/17/24 2208 03/18/24 0005         Subjective: Patient lying in bed.  Husband at bedside.  Denies any chest pain or shortness of breath.  No abdominal pain.   States has had growling in her abdomen.  Feels loose bowel movements are improving and states had 2 bowel movements that were improved with consistency today.  Had 1 bowel movement overnight with improved consistency.    Objective: Vitals:   03/22/24 1300 03/22/24 2057 03/23/24 0534 03/23/24 0800  BP: (!) 141/86 (!) 154/94 136/77   Pulse:  67 61   Resp:  16 16 13   Temp: 99.3 F (37.4 C) 98.2 F (36.8 C) 97.9 F (36.6 C)   TempSrc: Oral Oral Oral   SpO2: 99% 99% 95%   Weight:   69.4 kg   Height:        Intake/Output Summary (Last 24 hours) at 03/23/2024 1506 Last data filed at 03/23/2024 0525 Gross per 24 hour  Intake 480 ml  Output 300 ml  Net 180 ml   Filed Weights   03/21/24 0500 03/22/24 0500 03/23/24 0534  Weight: 66.4 kg 66.4 kg 69.4 kg    Examination:  General exam: NAD Respiratory system: CTAB.  No wheezes, no crackles, no rhonchi.  Fair air movement.  Speaking in full sentences.  Cardiovascular system: Irregularly irregular.  No JVD, no murmurs rubs or gallops.  No pitting lower extremity edema.  Gastrointestinal system: Abdomen is soft, nontender, nondistended, positive bowel sounds.  No rebound.  No guarding. Central nervous system: Alert and oriented. No focal neurological deficits. Extremities: Symmetric 5 x 5 power. Skin: No rashes, lesions or ulcers Psychiatry: Judgement and insight appear normal. Mood & affect appropriate.     Data Reviewed: I have personally reviewed following labs and imaging studies  CBC: Recent Labs  Lab 03/17/24 2206 03/18/24 1014 03/18/24 1821 03/19/24 0139 03/19/24 0726 03/19/24 1558 03/20/24 0725 03/21/24 0427 03/22/24 0501 03/23/24 0329  WBC 10.3 11.7*  --  6.4  --   --  5.2 6.0 5.3  --   NEUTROABS 8.8* 10.3*  --   --   --   --  3.5 4.0  --   --   HGB 6.8* 12.2   < > 10.8*   < > 11.4* 11.0* 11.0* 11.4* 11.2*  HCT 23.2* 38.3   < > 35.0*   < > 36.5 35.0* 36.5 37.1 36.5  MCV 92.1 87.2  --  89.5  --   --  89.5 90.1 90.3   --   PLT 158 222  --  175  --   --  195 209 213  --    < > = values in this interval not displayed.    Basic Metabolic Panel: Recent Labs  Lab 03/19/24 0139 03/20/24 0725 03/21/24 0427 03/22/24 0501 03/23/24 0329  NA 133* 134* 140 139 141  K 4.7 4.0 3.8 3.2* 4.0  CL 109 108 113* 112* 112*  CO2 18* 17* 18* 21* 22  GLUCOSE 116* 99 92 133* 85  BUN 29* 23 19 15 12   CREATININE 1.97* 1.82* 1.47* 1.16* 1.07*  CALCIUM  9.0 9.1 9.4 9.0 9.1  MG 2.6* 2.0 1.7 1.9 1.6*  PHOS 2.4* 2.7 3.1  --  2.7    GFR: Estimated Creatinine Clearance: 40.6 mL/min (A) (by C-G formula based on SCr of 1.07 mg/dL (H)).  Liver Function Tests: Recent Labs  Lab 03/17/24 2206 03/19/24 0139 03/20/24 0725 03/21/24 0427 03/23/24 0329  AST 14* 15  --   --   --   ALT 8 16  --   --   --   ALKPHOS 48 57  --   --   --   BILITOT 0.9 1.1  --   --   --   PROT 5.0* 5.1*  --   --   --   ALBUMIN 2.5* 2.4* 2.4* 2.5* 2.4*    CBG: Recent Labs  Lab 03/22/24 1244 03/22/24 1734 03/23/24 0016 03/23/24 0518 03/23/24 1121  GLUCAP 104* 159* 88 84 159*     Recent Results (from the past 240 hours)  Blood Culture (routine x 2)     Status: None (Preliminary result)   Collection Time: 03/17/24 10:06 PM   Specimen: BLOOD  Result Value Ref Range Status   Specimen Description   Final    BLOOD BLOOD RIGHT ARM Performed at [facility], [address], [place], [place] [address]    Special Requests   Final    BOTTLES DRAWN AEROBIC ONLY Blood Culture results may not be optimal due to an inadequate volume of blood received in culture bottles Performed at [facility], [address] [place], [place] [address]    Culture  Setup Time   Final    GRAM POSITIVE RODS AEROBIC BOTTLE ONLY CRITICAL RESULT CALLED TO, READ BACK BY AND VERIFIED WITH: PHARMD MELISSA J. [id number] AT 1106, ADC    Culture   Final    GRAM POSITIVE RODS IDENTIFICATION TO FOLLOW Performed at [facility], [address], [place], [place] [address]    Report Status PENDING  Incomplete  Resp panel by RT-PCR (RSV, Flu A&B, Covid) Anterior Nasal Swab     Status: None   Collection Time: 03/17/24 11:08 PM   Specimen: Anterior Nasal Swab  Result Value Ref Range Status   SARS Coronavirus 2 by RT PCR NEGATIVE NEGATIVE Final    Comment: (NOTE) SARS-CoV-2 target nucleic acids are NOT DETECTED.  The SARS-CoV-2 RNA is generally detectable in upper respiratory specimens during the acute phase of infection. The lowest concentration of SARS-CoV-2 viral copies this assay can detect is 138 copies/mL. A negative result does not preclude SARS-Cov-2 infection and should not be used as the sole basis for treatment or other patient management decisions. A negative result may occur with  improper specimen collection/handling, submission of specimen other than nasopharyngeal swab, presence of viral mutation(s) within the areas targeted by this assay, and inadequate number of viral copies(<138 copies/mL). A negative result must be combined with clinical observations, patient history, and epidemiological information. The expected result is Negative.  Fact Sheet for Patients:  BloggerCourse.com  Fact Sheet for Healthcare Providers:  SeriousBroker.it  This test is no t yet approved or cleared by the [place]  FDA and  has been authorized for detection and/or diagnosis of SARS-CoV-2 by FDA under an Emergency Use Authorization (EUA). This EUA will remain  in effect (meaning this test can be used) for the duration of the COVID-19 declaration under Section 564(b)(1) of the Act, 21 U.S.C.section 360bbb-3(b)(1), unless the authorization is terminated  or revoked sooner.       Influenza A by PCR NEGATIVE NEGATIVE Final   Influenza B by PCR NEGATIVE NEGATIVE Final    Comment: (NOTE) The Xpert Xpress SARS-CoV-2/FLU/RSV plus assay is intended as an aid in the  diagnosis of influenza from Nasopharyngeal swab specimens and should not be used as a sole basis for treatment. Nasal washings and aspirates are unacceptable for Xpert Xpress SARS-CoV-2/FLU/RSV testing.  Fact Sheet for Patients: BloggerCourse.com  Fact Sheet for Healthcare Providers: SeriousBroker.it  This test is not yet approved or cleared by the [place]  FDA and has been authorized for detection and/or diagnosis of SARS-CoV-2 by FDA under an Emergency Use Authorization (EUA). This EUA will remain in effect (meaning this test can be used) for the duration of the COVID-19 declaration under Section 564(b)(1) of the Act, 21 U.S.C. section 360bbb-3(b)(1), unless the authorization is terminated  or revoked.     Resp Syncytial Virus by PCR NEGATIVE NEGATIVE Final    Comment: (NOTE) Fact Sheet for Patients: BloggerCourse.com  Fact Sheet for Healthcare Providers: SeriousBroker.it  This test is not yet approved or cleared by the [place]  FDA and has been authorized for detection and/or diagnosis of SARS-CoV-2 by FDA under an Emergency Use Authorization (EUA). This EUA will remain in effect (meaning this test can be used) for the duration of the COVID-19 declaration under Section 564(b)(1) of the Act, 21 U.S.C. section 360bbb-3(b)(1), unless the authorization is terminated or revoked.  Performed at [facility], [address], [place], [place] [address]   Blood Culture (routine x 2)     Status: None (Preliminary result)   Collection Time: 03/17/24 11:35 PM   Specimen: BLOOD LEFT HAND  Result Value Ref Range Status   Specimen Description   Final    BLOOD LEFT HAND Performed at [facility], [address], [place], [place] [address]    Special Requests   Final    BOTTLES DRAWN AEROBIC ONLY Blood Culture results may not be optimal due  to an inadequate volume of blood received in culture bottles Performed at [facility], [address] [place], [place] [address]    Culture   Final    NO GROWTH 4 DAYS Performed at [facility], [address], [place], [place] [address]    Report Status PENDING  Incomplete  Urine Culture (for pregnant, neutropenic or urologic patients or patients with an indwelling urinary catheter)     Status: Abnormal   Collection Time: 03/18/24 12:04 AM   Specimen: Urine, Clean Catch  Result Value Ref Range Status   Specimen Description   Final    URINE, CLEAN CATCH Performed at [facility], [address], [place], [place] [address]    Special Requests   Final    NONE Performed at [facility], [address], [place], [place] [address]    Culture 60,000 COLONIES/mL ESCHERICHIA COLI (A)  Final   Report Status 03/21/2024 FINAL  Final   Organism ID, Bacteria ESCHERICHIA COLI (A)  Final      Susceptibility   Escherichia coli - MIC*    AMPICILLIN >=32 RESISTANT Resistant     CEFAZOLIN  <=4 SENSITIVE Sensitive     CEFEPIME  <=0.12 SENSITIVE Sensitive     CEFTRIAXONE  <=0.25 SENSITIVE Sensitive     CIPROFLOXACIN  <=0.25 SENSITIVE Sensitive     GENTAMICIN 4 SENSITIVE Sensitive     IMIPENEM <=0.25 SENSITIVE Sensitive     NITROFURANTOIN 64 INTERMEDIATE Intermediate     TRIMETH/SULFA <=20 SENSITIVE Sensitive     AMPICILLIN/SULBACTAM 8 SENSITIVE Sensitive     PIP/TAZO <=4 SENSITIVE Sensitive ug/mL    * 60,000 COLONIES/mL ESCHERICHIA COLI  C Difficile Quick Screen w PCR reflex     Status: Abnormal   Collection Time: 03/18/24  1:44 AM   Specimen: STOOL  Result Value Ref Range Status   C Diff antigen POSITIVE (A) NEGATIVE Final   C Diff toxin NEGATIVE NEGATIVE Final   C Diff interpretation Results are indeterminate. See PCR results.  Final    Comment: Performed at [facility], [address], [place], [place]  [address]  Gastrointestinal Panel by PCR , Stool     Status: None   Collection Time: 03/18/24  1:44 AM   Specimen: Stool  Result Value Ref Range Status   Campylobacter species NOT  DETECTED NOT DETECTED Final   Plesimonas shigelloides NOT DETECTED NOT DETECTED Final   Salmonella species NOT DETECTED NOT DETECTED Final   Yersinia enterocolitica NOT DETECTED NOT DETECTED Final   Vibrio species NOT DETECTED NOT DETECTED Final   Vibrio cholerae NOT DETECTED NOT DETECTED Final   Enteroaggregative E coli (EAEC) NOT DETECTED NOT DETECTED Final   Enteropathogenic E coli (EPEC) NOT DETECTED NOT DETECTED Final   Enterotoxigenic E coli (ETEC) NOT DETECTED NOT DETECTED Final   Shiga like toxin producing E coli (STEC) NOT DETECTED NOT DETECTED Final   Shigella/Enteroinvasive E coli (EIEC) NOT DETECTED NOT DETECTED Final   Cryptosporidium NOT DETECTED NOT DETECTED Final   Cyclospora cayetanensis NOT DETECTED NOT DETECTED Final   Entamoeba histolytica NOT DETECTED NOT DETECTED Final   Giardia lamblia NOT DETECTED NOT DETECTED Final   Adenovirus F40/41 NOT DETECTED NOT DETECTED Final   Astrovirus NOT DETECTED NOT DETECTED Final   Norovirus GI/GII NOT DETECTED NOT DETECTED Final   Rotavirus A NOT DETECTED NOT DETECTED Final   Sapovirus (I, II, IV, and V) NOT DETECTED NOT DETECTED Final    Comment: Performed at [facility], [address] [place], [place] [address]  C. Diff by PCR, Reflexed     Status: None   Collection Time: 03/18/24  1:44 AM  Result Value Ref Range Status   Toxigenic C. Difficile by PCR NEGATIVE NEGATIVE Final    Comment: Patient is colonized with non toxigenic C. difficile. May not need treatment unless significant symptoms are present. Performed at [facility], [address], [place], [place] [address]   MRSA Next Gen by PCR, Nasal     Status: None   Collection Time: 03/18/24  3:45 AM   Specimen: Nasal Mucosa; Nasal Swab  Result Value Ref Range Status   MRSA by  PCR Next Gen NOT DETECTED NOT DETECTED Final    Comment: (NOTE) The GeneXpert MRSA Assay (FDA approved for NASAL specimens only), is one component of a comprehensive MRSA colonization surveillance program. It is not intended to diagnose MRSA infection nor to guide or monitor treatment for MRSA infections. Test performance is not FDA approved in patients less than 47 years old. Performed at [facility], [address] [place], [place] [address]          Radiology Studies: No results found.       Scheduled Meds:  [START ON 03/24/2024] apixaban   5 mg Oral BID   bisoprolol   2.5 mg Oral Daily   buPROPion   75 mg Oral q morning   cefadroxil   1,000 mg Oral BID   Chlorhexidine  Gluconate Cloth  6 each Topical Daily   cholecalciferol   1,000 Units Oral Daily   colestipol   1 g Oral BID   FLUoxetine   30 mg Oral QHS   gabapentin   300 mg Oral BID   insulin  aspart  0-15 Units Subcutaneous Q6H   levothyroxine   88 mcg Oral Q0600   pantoprazole   40 mg Oral BID   psyllium  1 packet Oral Daily   saccharomyces boulardii  250 mg Oral BID   sodium bicarbonate   650 mg Oral BID   Continuous Infusions:     LOS: 5 days    Time spent: 35 minutes    Toribio Hummer, MD [facility]   To contact the attending provider between 7A-7P or the covering provider during after hours 7P-7A, please log into the web site [email or web address] and access using universal [place] password for  that web site. If you do not have the password, please call the hospital operator.  03/23/2024, 3:06 PM

## 2024-03-24 DIAGNOSIS — G9341 Metabolic encephalopathy: Secondary | ICD-10-CM | POA: Diagnosis not present

## 2024-03-24 DIAGNOSIS — D62 Acute posthemorrhagic anemia: Secondary | ICD-10-CM | POA: Diagnosis not present

## 2024-03-24 DIAGNOSIS — N3001 Acute cystitis with hematuria: Secondary | ICD-10-CM | POA: Diagnosis not present

## 2024-03-24 DIAGNOSIS — R579 Shock, unspecified: Secondary | ICD-10-CM | POA: Diagnosis not present

## 2024-03-24 LAB — BASIC METABOLIC PANEL WITH GFR
Anion gap: 8 (ref 5–15)
BUN: 10 mg/dL (ref 8–23)
CO2: 23 mmol/L (ref 22–32)
Calcium: 9.1 mg/dL (ref 8.9–10.3)
Chloride: 109 mmol/L (ref 98–111)
Creatinine, Ser: 0.97 mg/dL (ref 0.44–1.00)
GFR, Estimated: 60 mL/min — ABNORMAL LOW (ref >60)
Glucose, Bld: 100 mg/dL — ABNORMAL HIGH (ref 70–99)
Potassium: 4 mmol/L (ref 3.5–5.1)
Sodium: 140 mmol/L (ref 135–145)

## 2024-03-24 LAB — MAGNESIUM: Magnesium: 2.1 mg/dL (ref 1.7–2.4)

## 2024-03-24 LAB — GLUCOSE, CAPILLARY
Glucose-Capillary: 102 mg/dL — ABNORMAL HIGH (ref 70–99)
Glucose-Capillary: 111 mg/dL — ABNORMAL HIGH (ref 70–99)
Glucose-Capillary: 114 mg/dL — ABNORMAL HIGH (ref 70–99)
Glucose-Capillary: 120 mg/dL — ABNORMAL HIGH (ref 70–99)
Glucose-Capillary: 95 mg/dL (ref 70–99)

## 2024-03-24 LAB — CULTURE, BLOOD (ROUTINE X 2)

## 2024-03-24 LAB — HEMOGLOBIN AND HEMATOCRIT, BLOOD
HCT: 37.9 % (ref 36.0–46.0)
Hemoglobin: 11.1 g/dL — ABNORMAL LOW (ref 12.0–15.0)

## 2024-03-24 MED ORDER — SODIUM BICARBONATE 650 MG PO TABS
650.0000 mg | ORAL_TABLET | Freq: Once | ORAL | Status: AC
  Filled 2024-03-24: qty 1

## 2024-03-24 MED ORDER — INSULIN ASPART 100 UNIT/ML IJ SOLN: 0.0000 [IU] | Freq: Three times a day (TID) | INTRAMUSCULAR | Status: DC

## 2024-03-24 NOTE — Progress Notes (Signed)
 PROGRESS NOTE    [name]  FMW:[id number] DOB: 15-Aug-1946 DOA: 03/17/2024 PCP: Avva, Ravisankar, MD    Chief Complaint  Patient presents with   Abdominal Pain   Altered Mental Status   Fever    Brief Narrative:  Per HPI 78 yo female presented today at the instance of her family after finding her more altered this evening. Pt reportedly has been falling recently, however daughter and husband at bedside endorses that pt has not had trauma with her weakness/falls. When pt falls she is usually standing up and slides down. Never loses consciousness. No head or other body trauma. Pt states the only discomfort she has is with her abdomen. Daughter states that pt has had multiple days of diarrhea (c/w her previous cdiff infections), pt states that she does have nausea but this is typical for her, denies emesis.    She states she has previously had dark stool but has not appreciated that since she stopped her Iron supplements. She does have hematuria and states that she passes clots from her urinary tract. She has seen urology as outpt and after cystoscopy they noted cracks in her bladder and she states that the urologist feels her hematuria is 2/2 eliquis  use as well. + temp at home 101-102. 103 at the hospital, but no other si/sx of sepsis (less her diarrhea). She denies any recent steroid use, no recent abx use.    Family feels as though pt's mental status is improving since her presentation. She is on 2mcg of norepi at this time. Gi panel and cdiff + ua pending.    PCCM was asked for admission 2/2 her need for low dose norepi.  Urology consulted due to concerns for left hydronephrosis.   Assessment & Plan:   Principal Problem:   Shock (HCC) Active Problems:   Hypokalemia   Acute cystitis   Persistent atrial fibrillation (HCC)   Anticoagulant long-term use   Acute encephalopathy   Hypomagnesemia   Hypothyroidism   Acute metabolic encephalopathy   Acute renal failure  superimposed on stage 3a chronic kidney disease (HCC)   Hypophosphatemia   Acute blood loss anemia   Anemia of chronic disease  #1 hypotension/shock -Likely multifactorial secondary to volume depletion in the setting of diarrhea acute on chronic, probable UTI. - Patient initially required pressors which have subsequently been weaned off.   - BP improved.   -Urine cultures with 60,000 colonies of E. coli. - GI pathogen panel negative, C. difficile PCR noted to be positive for C. difficile antigen, C. difficile toxin negative likely colonization.  - Fever curve trended down.   - Leukocytosis trended down.   - Vancomycin  has been discontinued.   - Discontinued Flagyl  and cefepime .   - Patient was on IV cefazolin  and has been transition to Duricef to complete a course of antibiotic treatment.  - IV fluids discontinued.    2.  Acute cystitis/E. coli UTI -Urinalysis concerning for UTI. - Urine cultures with 60,000 colonies of E. coli and resistant to ampicillin but sensitive to the cephalosporins, fluoroquinolones, gentamicin, imipenem, Bactrim, Unasyn, Zosyn.   - Patient was on IV cefepime  and narrowed to IV cefazolin .   - Patient has been transition from IV cefazolin  to oral Duricef to complete a 7-day course of antibiotic treatment.  - Follow.  3.  AMS/acute encephalopathy - Improved with improvement with blood pressure and correction of metabolic derangements.  -Patient at baseline.  4.  Acute on chronic diarrhea -Patient with history of acute  on chronic diarrhea, noted to follow-up in the outpatient setting with Dr. Albertus and noted due to be seen in the office on 03/20/2024 per daughter.   - GI pathogen panel negative.   - C. difficile antigen positive, C. difficile toxin and PCR negative.   - Clinical improvement.   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   - Patient started on colestipol  and Metamucil.   -Patient with clinical improvement. - GI do not recommending treating patient for C. difficile at this time.   - GI trying to bulk her stool further with fiber.   - Outpatient follow-up with GI.   5.  Hematuria/left hydronephrosis/solitary kidney -Patient noted to have had some intermittent hematuria noted to have worsening and exacerbated by Eliquis  use as passes clots fairly regularly.   - Patient noted to have undergone a cystoscopy approximately a year ago being followed by Dr. Selma. - CT abdomen and pelvis as well as renal ultrasound with left hydronephrosis noted not completely new but worse from prior going all the way down to the UVJ.   - Patient seen in consultation by urology, who reviewed imaging and imaging without any evidence of acute obstruction and as such did not think any emergent intervention was indicated but are following closely.  - Renal function improved with hydration.  - Saline lock IV fluids - Per urology. - Outpatient follow-up with urology.  6.  AKI on CKD stage IIIa/status post right nephrectomy remote - Patient on presentation to be in acute kidney injury with a creatinine on presentation of 2.59. - Last creatinine noted of 1.55 (01/31/2024). - Likely secondary to prerenal azotemia as patient noted to be hypotensive on presentation. - Urinalysis turbid, large hemoglobin, large leukocytes, nitrite negative, many bacteria, WBC > 50. - Urine cultures with 60,000 colonies of E. coli - Renal ultrasound and CT abdomen and pelvis with noted mild left hydronephrosis to the level of the bladder trigone, no obstructing mass or calculi is identified. - Patient seen in consultation by urology who are recommending conservative treatments and if no improvement with  renal function have recommended probable stent placement. - Renal function improved with hydration and creatinine currently at 0.97. -IV fluids have been discontinued.  - Avoid nephrotoxins.  7.  Hypomagnesemia/hypokalemia/hypophosphatemia - Potassium currently at 4.0, magnesium  at 2.1, phosphorus at 2.7. - Status post K-Phos.   - Repeat labs in AM.  8.  NAGMA - Improved.  - Continue bicarb tablets.  - Follow.  9.  Chronic A-fib -Patient in A-fib however rate currently controlled. - Resume home regimen bisoprolol  for rate control. - Anticoagulation on hold due to presentation with hematuria, anemia requiring transfusion of 2 units PRBCs on admission, concern for possible urological procedure. - Discussed with urology and if no hematuria recommended Eliquis  may be resumed today, 03/24/2024.  - Eliquis  resumed.  10.  Acute blood loss anemia/anemia of chronic disease -Noted to have presented with hypotension, hematuria with hemoglobin dropping as low as 6.8 on presentation. - Status post Kcentra  on presentation to the ED and transfusion of 2 units PRBCs with hemoglobin currently at 11.1. -Follow H&H. - Transfusion threshold hemoglobin < 7.  11.  Hypothyroidism - Continue Synthroid .  DVT prophylaxis: SCDs>>> Eliquis  Code Status: Full Family Communication: Updated patient, daughter and granddaughter at bedside. Disposition: Insurance declined CIR.  SNF when bed available.  Status is: Inpatient Remains inpatient appropriate because: Severity of illness   Consultants:  Urology: Dr. Lovie 03/18/2024 Gastroenterology  Procedures:  CT abdomen and pelvis 03/18/2024 Chest x-ray 03/17/2024 Renal ultrasound 03/18/2024 2D echo 03/18/2024 Transfusion 2 units PRBC 03/18/2024  Antimicrobials:  Anti-infectives (From admission, onward)    Start     Dose/Rate Route Frequency Ordered Stop   03/22/24 1015  cefadroxil  (DURICEF) capsule 1,000 mg        1,000 mg Oral 2 times daily  03/22/24 0927 03/26/24 0959   03/22/24 0900  ceFAZolin  (ANCEF ) IVPB 1 g/50 mL premix  Status:  Discontinued        1 g 100 mL/hr over 30 Minutes Intravenous Every 8 hours 03/22/24 0807 03/22/24 0927   03/20/24 2200  ceFAZolin  (ANCEF ) IVPB 1 g/50 mL premix  Status:  Discontinued        1 g 100 mL/hr over 30 Minutes Intravenous Every 12 hours 03/20/24 1458 03/22/24 0807   03/20/24 0000  vancomycin  (VANCOREADY) IVPB 750 mg/150 mL  Status:  Discontinued        750 mg 150 mL/hr over 60 Minutes Intravenous Every 48 hours 03/18/24 0316 03/18/24 1407   03/18/24 2200  ceFEPIme  (MAXIPIME ) 2 g in sodium chloride  0.9 % 100 mL IVPB  Status:  Discontinued        2 g 200 mL/hr over 30 Minutes Intravenous Every 24 hours 03/18/24 0314 03/20/24 1458   03/18/24 1000  vancomycin  (VANCOCIN ) capsule 125 mg  Status:  Discontinued        125 mg Oral 4 times daily 03/18/24 0308 03/19/24 2143   03/18/24 1000  metroNIDAZOLE  (FLAGYL ) IVPB 500 mg  Status:  Discontinued        500 mg 100 mL/hr over 60 Minutes Intravenous Every 12 hours 03/18/24 0314 03/20/24 0929   03/18/24 0400  metroNIDAZOLE  (FLAGYL ) IVPB 500 mg  Status:  Discontinued        500 mg 100 mL/hr over 60 Minutes Intravenous  Once 03/18/24 0308 03/18/24 0314   03/18/24 0400  ceFEPIme  (MAXIPIME ) 2 g in sodium chloride  0.9 % 100 mL IVPB  Status:  Discontinued        2 g 200 mL/hr over 30 Minutes Intravenous  Once 03/18/24 0308 03/18/24 0314   03/17/24 2330  vancomycin  (VANCOCIN ) IVPB 1000 mg/200 mL premix        1,000 mg 200 mL/hr over 60 Minutes Intravenous  Once 03/17/24 2322 03/18/24 0238   03/17/24 2215  ceFEPIme  (MAXIPIME ) 2 g in sodium chloride  0.9 % 100 mL IVPB        2 g 200 mL/hr over 30 Minutes Intravenous  Once 03/17/24 2208 03/17/24 2250   03/17/24 2215  metroNIDAZOLE  (FLAGYL ) IVPB 500 mg        500 mg 100 mL/hr over 60 Minutes Intravenous  Once 03/17/24 2208 03/18/24 0005         Subjective: Lying in bed.  Overall feeling better.   Denies any chest pain or shortness of breath.  No significant abdominal pain.  Few stools consistency has improved as well as frequency of stool.  Daughter and granddaughter at bedside.    Objective: Vitals:   03/23/24 1600 03/23/24 2233 03/24/24 0425 03/24/24 0500  BP:  (!) 145/100 131/87   Pulse:  71 64   Resp: 16 18 18  Temp:  98.4 F (36.9 C) 97.8 F (36.6 C)   TempSrc:  Oral Oral   SpO2:  100% 97%   Weight:    69.2 kg  Height:        Intake/Output Summary (Last 24 hours) at 03/24/2024 1301 Last data filed at 03/24/2024 0000 Gross per 24 hour  Intake 480 ml  Output 400 ml  Net 80 ml   Filed Weights   03/22/24 0500 03/23/24 0534 03/24/24 0500  Weight: 66.4 kg 69.4 kg 69.2 kg    Examination:  General exam: NAD. Respiratory system: Lungs clear to auscultation bilaterally.  No wheezes, no crackles, no rhonchi.  Fair air movement.  Speaking full sentences. Cardiovascular system: Irregularly irregular.  No JVD, no murmurs rubs or gallops.  No pitting lower extremity edema.  Gastrointestinal system: Abdomen is soft, nontender, nondistended, positive bowel sounds.  No rebound.  No guarding.  Central nervous system: Alert and oriented. No focal neurological deficits. Extremities: Symmetric 5 x 5 power. Skin: No rashes, lesions or ulcers Psychiatry: Judgement and insight appear normal. Mood & affect appropriate.     Data Reviewed: I have personally reviewed following labs and imaging studies  CBC: Recent Labs  Lab 03/17/24 2206 03/18/24 1014 03/18/24 1821 03/19/24 0139 03/19/24 0726 03/20/24 0725 03/21/24 0427 03/22/24 0501 03/23/24 0329 03/24/24 0342  WBC 10.3 11.7*  --  6.4  --  5.2 6.0 5.3  --   --   NEUTROABS 8.8* 10.3*  --   --   --  3.5 4.0  --   --   --   HGB 6.8* 12.2   < > 10.8*   < > 11.0* 11.0* 11.4* 11.2* 11.1*  HCT 23.2* 38.3   < > 35.0*   < > 35.0* 36.5 37.1 36.5 37.9  MCV 92.1 87.2  --  89.5  --  89.5 90.1 90.3  --   --   PLT 158 222  --  175  --   195 209 213  --   --    < > = values in this interval not displayed.    Basic Metabolic Panel: Recent Labs  Lab 03/19/24 0139 03/20/24 0725 03/21/24 0427 03/22/24 0501 03/23/24 0329 03/24/24 0342  NA 133* 134* 140 139 141 140  K 4.7 4.0 3.8 3.2* 4.0 4.0  CL 109 108 113* 112* 112* 109  CO2 18* 17* 18* 21* 22 23  GLUCOSE 116* 99 92 133* 85 100*  BUN 29* 23 19 15 12 10   CREATININE 1.97* 1.82* 1.47* 1.16* 1.07* 0.97  CALCIUM  9.0 9.1 9.4 9.0 9.1 9.1  MG 2.6* 2.0 1.7 1.9 1.6* 2.1  PHOS 2.4* 2.7 3.1  --  2.7  --     GFR: Estimated Creatinine Clearance: 44.7 mL/min (by C-G formula based on SCr of 0.97 mg/dL).  Liver Function Tests: Recent Labs  Lab 03/17/24 2206 03/19/24 0139 03/20/24 0725 03/21/24 0427 03/23/24 0329  AST 14* 15  --   --   --   ALT 8 16  --   --   --   ALKPHOS 48 57  --   --   --   BILITOT 0.9 1.1  --   --   --   PROT 5.0* 5.1*  --   --   --   ALBUMIN 2.5* 2.4* 2.4* 2.5* 2.4*    CBG: Recent Labs  Lab 03/23/24 1121 03/23/24 1721 03/23/24 2358 03/24/24 0423 03/24/24 1247  GLUCAP 159* 91 114* 120* 95  Recent Results (from the past 240 hours)  Blood Culture (routine x 2)     Status: Abnormal   Collection Time: 03/17/24 10:06 PM   Specimen: BLOOD  Result Value Ref Range Status   Specimen Description   Final    BLOOD BLOOD RIGHT ARM Performed at [facility], [address] [place], [place] [address]    Special Requests   Final    BOTTLES DRAWN AEROBIC ONLY Blood Culture results may not be optimal due to an inadequate volume of blood received in culture bottles Performed at [facility], [address], [place], [place] [address]    Culture  Setup Time   Final    GRAM POSITIVE RODS AEROBIC BOTTLE ONLY CRITICAL RESULT CALLED TO, READ BACK BY AND VERIFIED WITH: PHARMD MELISSA J. [id number] AT 1106, ADC    Culture (A)  Final    BACILLUS SPECIES BACILLUS MEGATERIUM Standardized susceptibility testing for  this organism is not available. Performed at [facility], [address], [place], [place] [address]    Report Status 03/24/2024 FINAL  Final  Resp panel by RT-PCR (RSV, Flu A&B, Covid) Anterior Nasal Swab     Status: None   Collection Time: 03/17/24 11:08 PM   Specimen: Anterior Nasal Swab  Result Value Ref Range Status   SARS Coronavirus 2 by RT PCR NEGATIVE NEGATIVE Final    Comment: (NOTE) SARS-CoV-2 target nucleic acids are NOT DETECTED.  The SARS-CoV-2 RNA is generally detectable in upper respiratory specimens during the acute phase of infection. The lowest concentration of SARS-CoV-2 viral copies this assay can detect is 138 copies/mL. A negative result does not preclude SARS-Cov-2 infection and should not be used as the sole basis for treatment or other patient management decisions. A negative result may occur with  improper specimen collection/handling, submission of specimen other than nasopharyngeal swab, presence of viral mutation(s) within the areas targeted by this assay, and inadequate number of viral copies(<138 copies/mL). A negative result must be combined with clinical observations, patient history, and epidemiological information. The expected result is Negative.  Fact Sheet for Patients:  BloggerCourse.com  Fact Sheet for Healthcare Providers:  SeriousBroker.it  This test is no t yet approved or cleared by the [place]  FDA and  has been authorized for detection and/or diagnosis of SARS-CoV-2 by FDA under an Emergency Use Authorization (EUA). This EUA will remain  in effect (meaning this test can be used) for the duration of the COVID-19 declaration under Section 564(b)(1) of the Act, 21 U.S.C.section 360bbb-3(b)(1), unless the authorization is terminated  or revoked sooner.       Influenza A by PCR NEGATIVE NEGATIVE Final   Influenza B by PCR NEGATIVE NEGATIVE Final    Comment: (NOTE) The  Xpert Xpress SARS-CoV-2/FLU/RSV plus assay is intended as an aid in the diagnosis of influenza from Nasopharyngeal swab specimens and should not be used as a sole basis for treatment. Nasal washings and aspirates are unacceptable for Xpert Xpress SARS-CoV-2/FLU/RSV testing.  Fact Sheet for Patients: BloggerCourse.com  Fact Sheet for Healthcare Providers: SeriousBroker.it  This test is not yet approved or cleared by the [place]  FDA and has been authorized for detection and/or diagnosis of SARS-CoV-2 by FDA under an Emergency Use Authorization (EUA). This EUA will remain in effect (meaning this test can be used) for the duration of the COVID-19 declaration under Section 564(b)(1) of the Act, 21 U.S.C. section 360bbb-3(b)(1), unless the authorization is terminated or revoked.  Resp Syncytial Virus by PCR NEGATIVE NEGATIVE Final    Comment: (NOTE) Fact Sheet for Patients: BloggerCourse.com  Fact Sheet for Healthcare Providers: SeriousBroker.it  This test is not yet approved or cleared by the [place]  FDA and has been authorized for detection and/or diagnosis of SARS-CoV-2 by FDA under an Emergency Use Authorization (EUA). This EUA will remain in effect (meaning this test can be used) for the duration of the COVID-19 declaration under Section 564(b)(1) of the Act, 21 U.S.C. section 360bbb-3(b)(1), unless the authorization is terminated or revoked.  Performed at [facility], [address], [place], [place] [address]   Blood Culture (routine x 2)     Status: None   Collection Time: 03/17/24 11:35 PM   Specimen: BLOOD LEFT HAND  Result Value Ref Range Status   Specimen Description   Final    BLOOD LEFT HAND Performed at [facility], [address], [place], [place] [address]    Special Requests   Final    BOTTLES DRAWN  AEROBIC ONLY Blood Culture results may not be optimal due to an inadequate volume of blood received in culture bottles Performed at [facility], [address] [place], [place] [address]    Culture   Final    NO GROWTH 5 DAYS Performed at [facility], [address] [place], [place] [address]    Report Status 03/23/2024 FINAL  Final  Urine Culture (for pregnant, neutropenic or urologic patients or patients with an indwelling urinary catheter)     Status: Abnormal   Collection Time: 03/18/24 12:04 AM   Specimen: Urine, Clean Catch  Result Value Ref Range Status   Specimen Description   Final    URINE, CLEAN CATCH Performed at [facility], [address], [place], [place] [address]    Special Requests   Final    NONE Performed at [facility], [address], [place], [place] [address]    Culture 60,000 COLONIES/mL ESCHERICHIA COLI (A)  Final   Report Status 03/21/2024 FINAL  Final   Organism ID, Bacteria ESCHERICHIA COLI (A)  Final      Susceptibility   Escherichia coli - MIC*    AMPICILLIN >=32 RESISTANT Resistant     CEFAZOLIN  <=4 SENSITIVE Sensitive     CEFEPIME  <=0.12 SENSITIVE Sensitive     CEFTRIAXONE  <=0.25 SENSITIVE Sensitive     CIPROFLOXACIN  <=0.25 SENSITIVE Sensitive     GENTAMICIN 4 SENSITIVE Sensitive     IMIPENEM <=0.25 SENSITIVE Sensitive     NITROFURANTOIN 64 INTERMEDIATE Intermediate     TRIMETH/SULFA <=20 SENSITIVE Sensitive     AMPICILLIN/SULBACTAM 8 SENSITIVE Sensitive     PIP/TAZO <=4 SENSITIVE Sensitive ug/mL    * 60,000 COLONIES/mL ESCHERICHIA COLI  C Difficile Quick Screen w PCR reflex     Status: Abnormal   Collection Time: 03/18/24  1:44 AM   Specimen: STOOL  Result Value Ref Range Status   C Diff antigen POSITIVE (A) NEGATIVE Final   C Diff toxin NEGATIVE NEGATIVE Final   C Diff interpretation Results are indeterminate. See PCR results.  Final    Comment: Performed at [facility], [address], [place], [place] [address]  Gastrointestinal Panel by PCR , Stool     Status: None   Collection Time: 03/18/24  1:44 AM   Specimen: Stool  Result Value Ref Range Status   Campylobacter species NOT DETECTED NOT DETECTED Final   Plesimonas  shigelloides NOT DETECTED NOT DETECTED Final   Salmonella species NOT DETECTED NOT DETECTED Final   Yersinia enterocolitica NOT DETECTED NOT DETECTED Final   Vibrio species NOT DETECTED NOT DETECTED Final   Vibrio cholerae NOT DETECTED NOT DETECTED Final   Enteroaggregative E coli (EAEC) NOT DETECTED NOT DETECTED Final   Enteropathogenic E coli (EPEC) NOT DETECTED NOT DETECTED Final   Enterotoxigenic E coli (ETEC) NOT DETECTED NOT DETECTED Final   Shiga like toxin producing E coli (STEC) NOT DETECTED NOT DETECTED Final   Shigella/Enteroinvasive E coli (EIEC) NOT DETECTED NOT DETECTED Final   Cryptosporidium NOT DETECTED NOT DETECTED Final   Cyclospora cayetanensis NOT DETECTED NOT DETECTED Final   Entamoeba histolytica NOT DETECTED NOT DETECTED Final   Giardia lamblia NOT DETECTED NOT DETECTED Final   Adenovirus F40/41 NOT DETECTED NOT DETECTED Final   Astrovirus NOT DETECTED NOT DETECTED Final   Norovirus GI/GII NOT DETECTED NOT DETECTED Final   Rotavirus A NOT DETECTED NOT DETECTED Final   Sapovirus (I, II, IV, and V) NOT DETECTED NOT DETECTED Final    Comment: Performed at [facility], [address] [place], [place] [address]  C. Diff by PCR, Reflexed     Status: None   Collection Time: 03/18/24  1:44 AM  Result Value Ref Range Status   Toxigenic C. Difficile by PCR NEGATIVE NEGATIVE Final    Comment: Patient is colonized with non toxigenic C. difficile. May not need treatment unless significant symptoms are present. Performed at [facility], [address] [place], [place] [address]   MRSA Next Gen by PCR, Nasal     Status: None   Collection Time: 03/18/24  3:45 AM   Specimen: Nasal  Mucosa; Nasal Swab  Result Value Ref Range Status   MRSA by PCR Next Gen NOT DETECTED NOT DETECTED Final    Comment: (NOTE) The GeneXpert MRSA Assay (FDA approved for NASAL specimens only), is one component of a comprehensive MRSA colonization surveillance program. It is not intended to diagnose MRSA infection nor to guide or monitor treatment for MRSA infections. Test performance is not FDA approved in patients less than 34 years old. Performed at [facility], [address], [place], [place] [address]          Radiology Studies: No results found.       Scheduled Meds:  apixaban   5 mg Oral BID   bisoprolol   2.5 mg Oral Daily   buPROPion   75 mg Oral q morning   cefadroxil   1,000 mg Oral BID   Chlorhexidine  Gluconate Cloth  6 each Topical Daily   cholecalciferol   1,000 Units Oral Daily   colestipol   1 g Oral BID   FLUoxetine   30 mg Oral QHS   gabapentin   300 mg Oral BID   insulin  aspart  0-15 Units Subcutaneous Q6H   levothyroxine   88 mcg Oral Q0600   pantoprazole   40 mg Oral BID   psyllium  1 packet Oral Daily   saccharomyces boulardii  250 mg Oral BID   sodium bicarbonate   650 mg Oral BID   Continuous Infusions:     LOS: 6 days    Time spent: 35 minutes    Toribio Hummer, MD [facility]   To contact the attending provider between 7A-7P or the covering provider during after hours 7P-7A, please log into the web site [email or web address] and access using universal [place] password for that web site. If you do not have the password,  please call the hospital operator.  03/24/2024, 1:01 PM

## 2024-03-24 NOTE — Progress Notes (Signed)
 Inpatient Rehab Admissions Coordinator:    Insurance denied CIR admit.  Pt. Not really wanting SNF or HH but willing to talk to CIR.  Leita Kleine, MS, CCC-SLP Rehab Admissions Coordinator  [phone] (celll) [phone] (office)

## 2024-03-24 NOTE — Progress Notes (Signed)
 Physical Therapy Treatment Patient Details Name: [name] MRN: [id number] DOB: 30-Jan-1946 Today's Date: 03/24/2024   History of Present Illness Pt is a 78 y/o female admitted on 03/17/24 after presenting with c/o AMS with frequent falling. Pt is being treated for acute hypotension, multifactorial in origin. PMH: lung CA, recurrent C. Diff colitis, a-fib on eliquis , CVA, CKD 3A    PT Comments  Patient progressing to hallway ambulation this session.  She has significant crepitus in knees and admits to being weaker than previous.  Spouse present and supportive.  She does have some incontinence especially with transitions.  Remains appropriate for inpatient rehab (<3 hours/day) prior to d/c home.     If plan is discharge home, recommend the following: A lot of help with walking and/or transfers;Help with stairs or ramp for entrance;Assistance with cooking/housework;Assist for transportation   Can travel by private vehicle     No  Equipment Recommendations  None recommended by PT    Recommendations for Other Services       Precautions / Restrictions Precautions Precautions: Fall Precaution/Restrictions Comments: brief for incontinence     Mobility  Bed Mobility Overal bed mobility: Needs Assistance Bed Mobility: Sit to Supine       Sit to supine: Min assist   General bed mobility comments: assist for positioning; cues for rails to scoot up    Transfers Overall transfer level: Needs assistance Equipment used: Rolling walker (2 wheels) Transfers: Sit to/from Stand Sit to Stand: Min assist, +2 safety/equipment, Contact guard assist           General transfer comment: pt wanting to try on her own, some assistance for anterior weight shift and for balance during hand transition to walker and for safety with heavy UE support on armrests on chair    Ambulation/Gait Ambulation/Gait assistance: Min assist, +2 safety/equipment Gait Distance (Feet): 25 Feet (x 2) Assistive  device: Rolling walker (2 wheels) Gait Pattern/deviations: Step-to pattern, Step-through pattern, Antalgic, Trunk flexed, Knees buckling, Decreased stride length       General Gait Details: noted knee crepitus during ambulation and stand to sit; cues for posture and A for safety with chair follow   Stairs             Wheelchair Mobility     Tilt Bed    Modified Rankin (Stroke Patients Only)       Balance Overall balance assessment: Needs assistance Sitting-balance support: Feet supported Sitting balance-Leahy Scale: Fair     Standing balance support: Bilateral upper extremity supported, Reliant on assistive device for balance Standing balance-Leahy Scale: Poor Standing balance comment: heavy UE support                            Communication Communication Communication: No apparent difficulties  Cognition Arousal: Alert Behavior During Therapy: WFL for tasks assessed/performed   PT - Cognitive impairments: No apparent impairments                         Following commands: Intact      Cueing    Exercises      General Comments General comments (skin integrity, edema, etc.): applied brief for ambulation with some urinary and minimal fecal incontinence; spouse in the room and supportive      Pertinent Vitals/Pain Pain Assessment Faces Pain Scale: Hurts even more Pain Location: knees with ambulation Pain Descriptors / Indicators: Aching, Sharp Pain Intervention(s): Monitored  during session, Limited activity within patient's tolerance    Home Living                          Prior Function            PT Goals (current goals can now be found in the care plan section) Progress towards PT goals: Progressing toward goals    Frequency    Min 2X/week      PT Plan      Co-evaluation              AM-PAC PT 6 Clicks Mobility   Outcome Measure  Help needed turning from your back to your side while in a  flat bed without using bedrails?: A Little Help needed moving from lying on your back to sitting on the side of a flat bed without using bedrails?: A Little Help needed moving to and from a bed to a chair (including a wheelchair)?: A Little Help needed standing up from a chair using your arms (e.g., wheelchair or bedside chair)?: A Little Help needed to walk in hospital room?: A Little Help needed climbing 3-5 steps with a railing? : Total 6 Click Score: 16    End of Session Equipment Utilized During Treatment: Gait belt Activity Tolerance: Patient limited by fatigue Patient left: in bed;with call bell/phone within reach;with family/visitor present   PT Visit Diagnosis: Difficulty in walking, not elsewhere classified (R26.2);Muscle weakness (generalized) (M62.81) Pain - Right/Left:  (both) Pain - part of body: Knee     Time: 8481-8454 PT Time Calculation (min) (ACUTE ONLY): 27 min  Charges:    $Gait Training: 8-22 mins $Therapeutic Activity: 8-22 mins PT General Charges $$ ACUTE PT VISIT: 1 Visit                     Micheline Portal, PT Acute Rehabilitation Services Office:[phone] 03/24/2024    Montie Portal 03/24/2024, 3:53 PM

## 2024-03-24 NOTE — Progress Notes (Signed)
 Occupational Therapy Treatment Patient Details Name: [name] MRN: [id number] DOB: Mar 02, 1946 Today's Date: 03/24/2024   History of present illness Pt is a 78 y/o female admitted on 03/17/24 after presenting with c/o AMS with frequent falling. Pt is being treated for acute hypotension, multifactorial in origin. PMH: lung CA, recurrent C. Diff colitis, a-fib on eliquis , CVA, CKD 3A   OT comments  Pt making slow progress with functional goals. Pt reports that her knees give out om her and that she has not tried to get OOB or walk over the weekend, Pt agreeable to OOB abd to use RW to walk to [facility] and chair requiring CGA to sit EOB, mod A sit - stand to RW and min A for SPTs. Pt seated in chair for ADL tasks CGA/Sup to mod A. Pt unable to go to [facility] and would benefit from short term rehab at SNF, recs have been updated. OT will continue to follow acutely to maximize level of function and safety      If plan is discharge home, recommend the following:  Assistance with cooking/housework;Assist for transportation;Help with stairs or ramp for entrance;A lot of help with bathing/dressing/bathroom   Equipment Recommendations  Other (comment) (defer)    Recommendations for Other Services      Precautions / Restrictions Precautions Precautions: Fall Restrictions Weight Bearing Restrictions Per Provider Order: No       Mobility Bed Mobility Overal bed mobility: Needs Assistance Bed Mobility: Supine to Sit     Supine to sit: Contact guard, HOB elevated, Used rails          Transfers Overall transfer level: Needs assistance Equipment used: Rolling walker (2 wheels) Transfers: Sit to/from Stand Sit to Stand: Mod assist, Min assist           General transfer comment: verbal cues for hand placement     Balance Overall balance assessment: Needs assistance Sitting-balance support: Feet supported Sitting balance-Leahy Scale: Fair     Standing balance support: Bilateral upper  extremity supported, Reliant on assistive device for balance                               ADL either performed or assessed with clinical judgement   ADL Overall ADL's : Needs assistance/impaired     Grooming: Wash/dry hands;Wash/dry face;Set up;Supervision/safety;Sitting       Lower Body Bathing: Moderate assistance;Sitting/lateral leans   Upper Body Dressing : Contact guard assist;Sitting       Toilet Transfer: Moderate assistance;Rolling walker (2 wheels);Cueing for safety;Ambulation           Functional mobility during ADLs: Moderate assistance;Minimal assistance;Rolling walker (2 wheels);Cueing for safety      Extremity/Trunk Assessment Upper Extremity Assessment Upper Extremity Assessment: Generalized weakness;Right hand dominant RUE Deficits / Details: Shoulder and elbow AROM WFL. Able to demo functional gross graso of hand, though fine motor coordination is impaired due to significant arthritic changes of her hands LUE Deficits / Details: Shoulder and elbow AROM WFL. 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   Lower Extremity Assessment Lower Extremity Assessment: Defer to PT evaluation   Cervical / Trunk Assessment Cervical / Trunk Assessment: Normal    Vision Baseline Vision/History: 1 Wears glasses Ability to See in Adequate Light: 0 Adequate Patient Visual Report: No change from baseline     Perception  Praxis     Communication Communication Communication: No apparent difficulties   Cognition Arousal: Alert Behavior During Therapy: WFL for tasks assessed/performed Cognition: No apparent impairments                               Following commands: Intact        Cueing   Cueing Techniques: Verbal cues  Exercises      Shoulder Instructions       General Comments      Pertinent Vitals/ Pain       Pain  Assessment Pain Assessment: No/denies pain Pain Score: 6  Pain Location: abdomen Pain Descriptors / Indicators: Grimacing, Guarding, Discomfort Pain Intervention(s): Monitored during session, Repositioned  Home Living                                          Prior Functioning/Environment              Frequency  Min 2X/week        Progress Toward Goals  OT Goals(current goals can now be found in the care plan section)  Progress towards OT goals: Progressing toward goals     Plan      Co-evaluation                 AM-PAC OT 6 Clicks Daily Activity     Outcome Measure   Help from another person eating meals?: None Help from another person taking care of personal grooming?: A Little Help from another person toileting, which includes using toliet, bedpan, or urinal?: A Lot Help from another person bathing (including washing, rinsing, drying)?: A Lot Help from another person to put on and taking off regular upper body clothing?: A Little Help from another person to put on and taking off regular lower body clothing?: A Lot 6 Click Score: 16    End of Session Equipment Utilized During Treatment: Gait belt;Rolling walker (2 wheels)  OT Visit Diagnosis: History of falling (Z91.81);Muscle weakness (generalized) (M62.81);Pain;Other abnormalities of gait and mobility (R26.89) Pain - part of body:  (ABD)   Activity Tolerance Patient tolerated treatment well   Patient Left with call bell/phone within reach;with family/visitor present;in chair   Nurse Communication Mobility status        Time: 8845-8781 OT Time Calculation (min): 24 min  Charges: OT General Charges $OT Visit: 1 Visit OT Treatments $Self Care/Home Management : 8-22 mins $Therapeutic Activity: 8-22 mins    Jacques Karna Loose 03/24/2024, 2:05 PM

## 2024-03-24 NOTE — TOC Progression Note (Signed)
 Transition of Care [facility]) - Progression Note    Patient Details  Name: [name] MRN: [id number] Date of Birth: September 24, 1945  [facility]) CM/SW Contact  Tawni CHRISTELLA Eva, LCSW Phone Number: 03/24/2024, 2:53 PM  Clinical Narrative:     CSW met with the pt and the pt's husband at bedside to discuss recommendations. The pt is aware of the [facility] denial and initially wanted to go home. After further discussion, the pt agreed to have CSW fax her information for SNF placement. CSW explained the process for SNF placement, noting that insurance authorization will be required. IP Care Management to follow.    Expected Discharge Plan:  (TBD) Barriers to Discharge: Continued Medical Work up               Expected Discharge Plan and Services                                               Social Drivers of Health (SDOH) Interventions SDOH Screenings   Food Insecurity: No Food Insecurity (03/18/2024)  Housing: Low Risk  (03/18/2024)  Transportation Needs: No Transportation Needs (03/18/2024)  Utilities: Not At Risk (03/18/2024)  Depression (PHQ2-9): Low Risk  (02/28/2023)  Social Connections: Socially Integrated (03/18/2024)  Tobacco Use: Medium Risk (03/18/2024)    Readmission Risk Interventions    01/27/2024   10:41 AM 01/03/2024   12:11 PM 05/23/2023   10:20 AM  Readmission Risk Prevention Plan  Transportation Screening Complete Complete Complete  PCP or Specialist Appt within 5-7 Days Complete  Complete  PCP or Specialist Appt within 3-5 Days  Complete   Home Care Screening Complete  Complete  Medication Review (RN CM) Complete  Complete  HRI or Home Care Consult  Complete   Social Work Consult for Recovery Care Planning/Counseling  Complete   Palliative Care Screening  Complete   Medication Review Oceanographer)  Complete

## 2024-03-24 NOTE — [place] FL2 (Signed)
 [place]  MEDICAID FL2 LEVEL OF CARE FORM     IDENTIFICATION  Patient Name: [name] Birthdate: [birth year] Sex: female Admission Date (Current Location): 03/17/2024  [facility] and [place] Number:  Producer, television/film/video and Address:  [facility],  [address], [place] [address]      Provider Number: [id number]  Attending Physician Name and Address:  Sebastian Toribio GAILS, MD  Relative Name and Phone Number:  Karle, Desrosier (Spouse)  [phone] (Mobile    Current Level of Care: Hospital Recommended Level of Care: Skilled Nursing Facility Prior Approval Number:    Date Approved/Denied:   PASRR Number: [id number]  Discharge Plan: SNF    Current Diagnoses: Patient Active Problem List   Diagnosis Date Noted   Hypophosphatemia 03/19/2024   Acute blood loss anemia 03/19/2024   Anemia of chronic disease 03/19/2024   Shock (HCC) 03/18/2024   Acute hypoxemic respiratory failure (HCC) 01/25/2024   Pyelonephritis 01/02/2024   Acute renal failure superimposed on stage 3a chronic kidney disease (HCC) 01/02/2024   COPD (chronic obstructive pulmonary disease) (HCC)    Gastritis and gastroduodenitis 07/12/2023   Dilated pancreatic duct 07/12/2023   Dilation of biliary tract 07/03/2023   Acute cystitis 05/22/2023   Abdominal pain 05/22/2023   History of CVA (cerebrovascular accident) 05/22/2023   GAD (generalized anxiety disorder) 05/22/2023   Lung cancer (HCC) 03/01/2023   Lung nodule 02/15/2023   Clostridioides difficile infection 12/11/2022   Malnutrition of moderate degree (HCC) 12/08/2022   Intractable nausea and vomiting 12/07/2022   Hyponatremia 12/07/2022   Acute urinary retention 06/18/2022   Acute metabolic encephalopathy 06/17/2022   Pyuria 06/17/2022   GERD (gastroesophageal reflux disease) 06/17/2022   Hyperglycemia 06/17/2022   Low back pain 05/28/2020   Dilation of pancreatic duct 05/11/2020   Anterior dislocation of right shoulder  06/17/2019   Shoulder pain, right 06/17/2019   Duodenitis 02/24/2019   Chronic pain syndrome 02/24/2019   Debility 02/13/2019   Nausea and vomiting    Aspiration pneumonia (HCC)    Diarrhea    Pressure injury of skin 02/08/2019   Pneumonia of right lower lobe due to methicillin susceptible Staphylococcus aureus (MSSA) (HCC)    Volume overload state of heart    Acute encephalopathy 02/01/2019   Acute respiratory failure with hypoxia (HCC) 02/01/2019   Hypokalemia 02/01/2019   Hypomagnesemia 02/01/2019   Leukocytosis 02/01/2019   Osteoarthritis 02/01/2019   Hypothyroidism 02/01/2019   Anticoagulant long-term use    Persistent atrial fibrillation (HCC) 06/27/2017   Hip fracture (HCC) 06/27/2017   Irregular heart rate    Closed right hip fracture, initial encounter (HCC) 06/26/2017   Protein-calorie malnutrition, severe 06/30/2016   S/P laparoscopic cholecystectomy 06/28/2016   Visual field loss following stroke    Hyperlipidemia    Essential hypertension     Orientation RESPIRATION BLADDER Height & Weight     Self, Time, Situation, Place  Normal Incontinent Weight: 152 lb 8.9 oz (69.2 kg) Height:  5' 6 (167.6 cm)  BEHAVIORAL SYMPTOMS/MOOD NEUROLOGICAL BOWEL NUTRITION STATUS      Incontinent Diet (2 gram sodium)  AMBULATORY STATUS COMMUNICATION OF NEEDS Skin   Limited Assist Verbally Normal                       Personal Care Assistance Level of Assistance  Bathing, Feeding, Dressing Bathing Assistance: Limited assistance Feeding assistance: Independent Dressing Assistance: Limited assistance     Functional Limitations Info  Sight, Hearing, Speech Sight Info: Impaired (  glasses) Hearing Info: Adequate Speech Info: Adequate    SPECIAL CARE FACTORS FREQUENCY  PT (By licensed PT), OT (By licensed OT)     PT Frequency: 5 x a week OT Frequency: 5 x a week            Contractures Contractures Info: Not present    Additional Factors Info  Code Status,  Allergies Code Status Info: full Allergies Info: Penicillins           Current Medications (03/24/2024):  This is the current hospital active medication list Current Facility-Administered Medications  Medication Dose Route Frequency Provider Last Rate Last Admin   acetaminophen  (TYLENOL ) tablet 650 mg  650 mg Oral Q6H PRN Amponsah, Prosper M, MD   650 mg at 03/23/24 2157   albuterol  (PROVENTIL ) (2.5 MG/3ML) 0.083% nebulizer solution 3 mL  3 mL Inhalation Q6H PRN Sebastian Toribio GAILS, MD       apixaban  (ELIQUIS ) tablet 5 mg  5 mg Oral BID Sebastian Toribio GAILS, MD   5 mg at 03/24/24 9053   bisoprolol  (ZEBETA ) tablet 2.5 mg  2.5 mg Oral Daily Sebastian Toribio GAILS, MD   2.5 mg at 03/24/24 9053   buPROPion  (WELLBUTRIN ) tablet 75 mg  75 mg Oral q morning Sebastian Toribio GAILS, MD   75 mg at 03/24/24 0946   cefadroxil  (DURICEF) capsule 1,000 mg  1,000 mg Oral BID Sebastian Toribio GAILS, MD   1,000 mg at 03/24/24 9053   Chlorhexidine  Gluconate Cloth 2 % PADS 6 each  6 each Topical Daily Layman Raisin, DO   6 each at 03/21/24 1017   cholecalciferol  (VITAMIN D3) tablet 1,000 Units  1,000 Units Oral Daily Sebastian Toribio GAILS, MD   1,000 Units at 03/24/24 9053   colestipol  (COLESTID ) tablet 1 g  1 g Oral BID Beather Delon Gibson, [place]   1 g at 03/24/24 9053   docusate sodium  (COLACE) capsule 100 mg  100 mg Oral BID PRN Layman Raisin, DO       FLUoxetine  (PROZAC ) capsule 30 mg  30 mg Oral QHS Landy Veva CROME, RPH   30 mg at 03/23/24 2122   gabapentin  (NEURONTIN ) capsule 300 mg  300 mg Oral BID Sebastian Toribio GAILS, MD   300 mg at 03/24/24 9053   insulin  aspart (novoLOG ) injection 0-15 Units  0-15 Units Subcutaneous TID WC Sebastian Toribio GAILS, MD       levothyroxine  (SYNTHROID ) tablet 88 mcg  88 mcg Oral Q0600 Sebastian Toribio GAILS, MD   88 mcg at 03/24/24 9391   loratadine  (CLARITIN ) tablet 10 mg  10 mg Oral Daily PRN Thompson, Daniel V, MD       melatonin tablet 5 mg  5 mg Oral QHS PRN Haze Led, MD   5 mg at  03/23/24 2121   ondansetron  (ZOFRAN ) injection 4 mg  4 mg Intravenous Q6H PRN Paliwal, Led, MD   4 mg at 03/23/24 2219   Oral care mouth rinse  15 mL Mouth Rinse PRN Geronimo Amel, MD       pantoprazole  (PROTONIX ) EC tablet 40 mg  40 mg Oral BID Carolee Rosaline DASEN, RPH   40 mg at 03/24/24 0946   polyethylene glycol (MIRALAX  / GLYCOLAX ) packet 17 g  17 g Oral Daily PRN Layman Raisin, DO       psyllium (HYDROCIL/METAMUCIL) 1 packet  1 packet Oral Daily Lemmon, Jennifer Lynne, [place]   1 packet at 03/24/24 9053   saccharomyces boulardii (FLORASTOR) capsule 250 mg  250 mg  Oral BID Sebastian Toribio GAILS, MD   250 mg at 03/24/24 9053   sodium bicarbonate  tablet 650 mg  650 mg Oral BID Sebastian Toribio GAILS, MD   650 mg at 03/24/24 9053     Discharge Medications: Please see discharge summary for a list of discharge medications.  Relevant Imaging Results:  Relevant Lab Results:   Additional Information [id number]  Tawni HERO Diezel Mazur, LCSW

## 2024-03-25 DIAGNOSIS — N3001 Acute cystitis with hematuria: Secondary | ICD-10-CM | POA: Diagnosis not present

## 2024-03-25 DIAGNOSIS — D62 Acute posthemorrhagic anemia: Secondary | ICD-10-CM | POA: Diagnosis not present

## 2024-03-25 DIAGNOSIS — G9341 Metabolic encephalopathy: Secondary | ICD-10-CM | POA: Diagnosis not present

## 2024-03-25 DIAGNOSIS — R579 Shock, unspecified: Secondary | ICD-10-CM | POA: Diagnosis not present

## 2024-03-25 LAB — HEMOGLOBIN AND HEMATOCRIT, BLOOD
HCT: 38.7 % (ref 36.0–46.0)
Hemoglobin: 11.7 g/dL — ABNORMAL LOW (ref 12.0–15.0)

## 2024-03-25 LAB — GLUCOSE, CAPILLARY
Glucose-Capillary: 94 mg/dL (ref 70–99)
Glucose-Capillary: 111 mg/dL — ABNORMAL HIGH (ref 70–99)
Glucose-Capillary: 99 mg/dL (ref 70–99)
Glucose-Capillary: 116 mg/dL — ABNORMAL HIGH (ref 70–99)

## 2024-03-25 LAB — BASIC METABOLIC PANEL WITH GFR
Anion gap: 10 (ref 5–15)
BUN: 10 mg/dL (ref 8–23)
CO2: 24 mmol/L (ref 22–32)
Calcium: 9.3 mg/dL (ref 8.9–10.3)
Chloride: 107 mmol/L (ref 98–111)
Creatinine, Ser: 1 mg/dL (ref 0.44–1.00)
GFR, Estimated: 58 mL/min — ABNORMAL LOW (ref >60)
Glucose, Bld: 98 mg/dL (ref 70–99)
Potassium: 4 mmol/L (ref 3.5–5.1)
Sodium: 141 mmol/L (ref 135–145)

## 2024-03-25 NOTE — Progress Notes (Signed)
 Physical Therapy Treatment Patient Details Name: [name] MRN: [id number] DOB: [birth year] Today's Date: 03/25/2024   History of Present Illness Pt is a 78 y/o female admitted on 03/17/24 after presenting with c/o AMS with frequent falling. Pt is being treated for acute hypotension, multifactorial in origin. PMH: lung CA, recurrent C. Diff colitis, a-fib on eliquis , CVA, CKD 3A    PT Comments  Today's PT session focused on transfers and ambulation. Pt ambulated ~56ft x2 with seated rest break between (close chair follow provided for safety).  Pt continues to remain limited by increased pain in B knees, decreased endurance,and muscle weakness. Pt will benefit from continued skilled PT to increase their independence and maximize safety with mobility.      If plan is discharge home, recommend the following: A lot of help with walking and/or transfers;Help with stairs or ramp for entrance;Assistance with cooking/housework;Assist for transportation;A little help with bathing/dressing/bathroom   Can travel by private vehicle     No  Equipment Recommendations  None recommended by PT    Recommendations for Other Services       Precautions / Restrictions Precautions Precautions: Fall Precaution/Restrictions Comments: brief for incontinence Restrictions Weight Bearing Restrictions Per Provider Order: No     Mobility  Bed Mobility Overal bed mobility: Needs Assistance Bed Mobility: Supine to Sit     Supine to sit: Supervision     General bed mobility comments: increased time    Transfers Overall transfer level: Needs assistance Equipment used: Rolling walker (2 wheels) Transfers: Sit to/from Stand Sit to Stand: Min assist, +2 physical assistance, +2 safety/equipment           General transfer comment: STS x2, initially MIN A+2 from EOB, able to progress to CGA with increased assist to rise from recliner during seated rest break    Ambulation/Gait Ambulation/Gait  assistance: Min assist, +2 safety/equipment Gait Distance (Feet): 28 Feet Assistive device: Rolling walker (2 wheels) Gait Pattern/deviations: Antalgic, Trunk flexed, Knees buckling, Decreased stride length, Step-through pattern Gait velocity: decreased     General Gait Details: 58ft x with seated rest break between. initially MIn A+2 with intermittent periods of CGA. x2 episodes of mild L knee buckling. x2 brief standing rest breaks on 2nd bout of ambulation. Cues for proximity to RW and forward head-pt has history of back issues which limits ability to maintain upright posture.   Stairs             Wheelchair Mobility     Tilt Bed    Modified Rankin (Stroke Patients Only)       Balance Overall balance assessment: Needs assistance Sitting-balance support: Feet supported Sitting balance-Leahy Scale: Fair     Standing balance support: Bilateral upper extremity supported, Reliant on assistive device for balance Standing balance-Leahy Scale: Poor Standing balance comment: heavy UE support                            Communication Communication Communication: No apparent difficulties  Cognition Arousal: Alert Behavior During Therapy: WFL for tasks assessed/performed   PT - Cognitive impairments: No apparent impairments                         Following commands: Intact      Cueing    Exercises      General Comments        Pertinent Vitals/Pain Pain Assessment Pain Assessment: Faces Faces Pain Scale: Hurts  even more Pain Location: knees with ambulation Pain Descriptors / Indicators: Aching, Sharp Pain Intervention(s): Limited activity within patient's tolerance, Monitored during session, Repositioned    Home Living                          Prior Function            PT Goals (current goals can now be found in the care plan section) Acute Rehab PT Goals Patient Stated Goal: get better PT Goal Formulation: With  patient Time For Goal Achievement: 04/02/24 Potential to Achieve Goals: Good Progress towards PT goals: Progressing toward goals    Frequency    Min 2X/week      PT Plan      Co-evaluation              AM-PAC PT 6 Clicks Mobility   Outcome Measure  Help needed turning from your back to your side while in a flat bed without using bedrails?: A Little Help needed moving from lying on your back to sitting on the side of a flat bed without using bedrails?: A Little Help needed moving to and from a bed to a chair (including a wheelchair)?: Total Help needed standing up from a chair using your arms (e.g., wheelchair or bedside chair)?: Total Help needed to walk in hospital room?: Total Help needed climbing 3-5 steps with a railing? : Total 6 Click Score: 10    End of Session Equipment Utilized During Treatment: Gait belt Activity Tolerance: Patient limited by pain Patient left: in chair;with call bell/phone within reach;with chair alarm set;with nursing/sitter in room;with family/visitor present Nurse Communication: Mobility status PT Visit Diagnosis: Difficulty in walking, not elsewhere classified (R26.2);Muscle weakness (generalized) (M62.81) Pain - Right/Left:  (bilateral) Pain - part of body: Knee     Time: 1425-1449 PT Time Calculation (min) (ACUTE ONLY): 24 min  Charges:    $Therapeutic Activity: 23-37 mins PT General Charges $$ ACUTE PT VISIT: 1 Visit                     Tinnie BERRY PT, DPT  Acute Rehabilitation Services  Office [phone]  03/25/2024, 3:36 PM

## 2024-03-25 NOTE — Progress Notes (Signed)
 PROGRESS NOTE    [name]  FMW:[id number] DOB: [birth year] DOA: 03/17/2024 PCP: Avva, Ravisankar, MD    Chief Complaint  Patient presents with   Abdominal Pain   Altered Mental Status   Fever    Brief Narrative:  Per HPI 78 yo female presented today at the instance of her family after finding her more altered this evening. Pt reportedly has been falling recently, however daughter and husband at bedside endorses that pt has not had trauma with her weakness/falls. When pt falls she is usually standing up and slides down. Never loses consciousness. No head or other body trauma. Pt states the only discomfort she has is with her abdomen. Daughter states that pt has had multiple days of diarrhea (c/w her previous cdiff infections), pt states that she does have nausea but this is typical for her, denies emesis.    She states she has previously had dark stool but has not appreciated that since she stopped her Iron supplements. She does have hematuria and states that she passes clots from her urinary tract. She has seen urology as outpt and after cystoscopy they noted cracks in her bladder and she states that the urologist feels her hematuria is 2/2 eliquis  use as well. + temp at home 101-102. 103 at the hospital, but no other si/sx of sepsis (less her diarrhea). She denies any recent steroid use, no recent abx use.    Family feels as though pt's mental status is improving since her presentation. She is on 2mcg of norepi at this time. Gi panel and cdiff + ua pending.    PCCM was asked for admission 2/2 her need for low dose norepi.  Urology consulted due to concerns for left hydronephrosis.   Assessment & Plan:   Principal Problem:   Shock (HCC) Active Problems:   Hypokalemia   Acute cystitis   Persistent atrial fibrillation (HCC)   Anticoagulant long-term use   Acute encephalopathy   Hypomagnesemia   Hypothyroidism   Acute metabolic encephalopathy   Acute renal failure  superimposed on stage 3a chronic kidney disease (HCC)   Hypophosphatemia   Acute blood loss anemia   Anemia of chronic disease  #1 hypotension/shock -Likely multifactorial secondary to volume depletion in the setting of diarrhea acute on chronic, probable UTI. - Patient initially required pressors which have subsequently been weaned off.   - BP improved.   -Urine cultures with 60,000 colonies of E. coli. - GI pathogen panel negative, C. difficile PCR noted to be positive for C. difficile antigen, C. difficile toxin negative likely colonization.  - Fever curve trended down.   - Leukocytosis trended down.   - Vancomycin  has been discontinued.   - Discontinued Flagyl  and cefepime .   - Patient was on IV cefazolin  and has been transitioned to Duricef to complete a course of antibiotic treatment.  - IV fluids discontinued.    2.  Acute cystitis/E. coli UTI -Urinalysis concerning for UTI. - Urine cultures with 60,000 colonies of E. coli and resistant to ampicillin but sensitive to the cephalosporins, fluoroquinolones, gentamicin, imipenem, Bactrim, Unasyn, Zosyn.   - Patient was on IV cefepime  and narrowed to IV cefazolin .   - Patient has been transition from IV cefazolin  to oral Duricef to complete a 7-day course of antibiotic treatment.  - Follow.  3.  AMS/acute encephalopathy - Improved with improvement with blood pressure and correction of metabolic derangements.  -Patient at baseline.  4.  Acute on chronic diarrhea -Patient with history of acute  on chronic diarrhea, noted to follow-up in the outpatient setting with Dr. Albertus and noted due to be seen in the office on 03/20/2024 per daughter however unable to attend appointment due to hospitalization.   - GI pathogen panel negative.   - C. difficile antigen positive, C. difficile toxin and PCR negative.   - Clinical improvement.   - Family insistent on GI evaluation during this hospitalization and as such patient seen in consultation by  GI who few changes in patient bowel habits likely secondary to irritable bowel/functional in the setting of prior C. difficile infection.   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   - Patient started on colestipol  and Metamucil.   -Patient with clinical improvement. - GI do not recommending treating patient for C. difficile at this time.   - GI trying to bulk her stool further with fiber.   - Outpatient follow-up with GI as scheduled.   5.  Hematuria/left hydronephrosis/solitary kidney -Patient noted to have had some intermittent hematuria noted to have worsening and exacerbated by Eliquis  use as passes clots fairly regularly.   - Patient noted to have undergone a cystoscopy approximately a year ago being followed by Dr. Selma. - CT abdomen and pelvis as well as renal ultrasound with left hydronephrosis noted not completely new but worse from prior going all the way down to the UVJ.   - Patient seen in consultation by urology, who reviewed imaging and imaging without any evidence of acute obstruction and as such did not think any emergent intervention was indicated but are following closely.  - Renal function improved with hydration.  - Saline lock IV fluids - Per urology. - Outpatient follow-up with urology.  6.  AKI on CKD stage IIIa/status post right nephrectomy remote - Patient on presentation to be in acute kidney injury with a creatinine on presentation of 2.59. - Last creatinine noted of 1.55 (01/31/2024). - Likely secondary to prerenal azotemia as patient noted to be hypotensive on presentation. - Urinalysis turbid, large hemoglobin, large leukocytes, nitrite negative, many bacteria, WBC > 50. - Urine cultures with 60,000 colonies of E. coli - Renal ultrasound and CT abdomen and pelvis with noted mild left hydronephrosis to the level of the bladder trigone, no obstructing mass or calculi is identified. - Patient seen in consultation by  urology who are recommending conservative treatments and if no improvement with renal function have recommended probable stent placement. - Renal function improved with hydration and creatinine currently at 1.00. -IV fluids have been discontinued.  - Avoid nephrotoxins.  7.  Hypomagnesemia/hypokalemia/hypophosphatemia - Potassium currently at 4.0, magnesium  at 2.1, phosphorus at 2.7. - Status post K-Phos.   - Repeat labs in AM.  8.  NAGMA - Improved on bicarb tablets. - Discontinue bicarb tablets. - Follow.  9.  Chronic A-fib -Patient in A-fib however rate currently controlled. - Resume home regimen bisoprolol  for rate control. - Anticoagulation on hold due to presentation with hematuria, anemia requiring transfusion of 2 units PRBCs on admission, concern for possible urological procedure. - Discussed with urology and if no hematuria recommended Eliquis  may be resumed on, 03/24/2024.  - Eliquis  resumed. - Patient with no overt bleeding.  10.  Acute blood loss anemia/anemia of chronic disease -Noted to have presented with hypotension, hematuria with hemoglobin dropping as low as 6.8 on presentation. - Status post Kcentra  on presentation to the ED and transfusion of 2 units PRBCs with hemoglobin currently at 11.7. -  Follow H&H. - Transfusion threshold hemoglobin < 7.  11.  Hypothyroidism - Synthroid .      DVT prophylaxis: SCDs>>> Eliquis  Code Status: Full Family Communication: Updated patient, granddaughter at bedside.  Disposition: Insurance declined CIR.  SNF when bed available.  Status is: Inpatient Remains inpatient appropriate because: Severity of illness   Consultants:  Urology: Dr. Lovie 03/18/2024 Gastroenterology  Procedures:  CT abdomen and pelvis 03/18/2024 Chest x-ray 03/17/2024 Renal ultrasound 03/18/2024 2D echo 03/18/2024 Transfusion 2 units PRBC 03/18/2024  Antimicrobials:  Anti-infectives (From admission, onward)    Start     Dose/Rate Route Frequency  Ordered Stop   03/22/24 1015  cefadroxil  (DURICEF) capsule 1,000 mg        1,000 mg Oral 2 times daily 03/22/24 0927 03/26/24 0959   03/22/24 0900  ceFAZolin  (ANCEF ) IVPB 1 g/50 mL premix  Status:  Discontinued        1 g 100 mL/hr over 30 Minutes Intravenous Every 8 hours 03/22/24 0807 03/22/24 0927   03/20/24 2200  ceFAZolin  (ANCEF ) IVPB 1 g/50 mL premix  Status:  Discontinued        1 g 100 mL/hr over 30 Minutes Intravenous Every 12 hours 03/20/24 1458 03/22/24 0807   03/20/24 0000  vancomycin  (VANCOREADY) IVPB 750 mg/150 mL  Status:  Discontinued        750 mg 150 mL/hr over 60 Minutes Intravenous Every 48 hours 03/18/24 0316 03/18/24 1407   03/18/24 2200  ceFEPIme  (MAXIPIME ) 2 g in sodium chloride  0.9 % 100 mL IVPB  Status:  Discontinued        2 g 200 mL/hr over 30 Minutes Intravenous Every 24 hours 03/18/24 0314 03/20/24 1458   03/18/24 1000  vancomycin  (VANCOCIN ) capsule 125 mg  Status:  Discontinued        125 mg Oral 4 times daily 03/18/24 0308 03/19/24 2143   03/18/24 1000  metroNIDAZOLE  (FLAGYL ) IVPB 500 mg  Status:  Discontinued        500 mg 100 mL/hr over 60 Minutes Intravenous Every 12 hours 03/18/24 0314 03/20/24 0929   03/18/24 0400  metroNIDAZOLE  (FLAGYL ) IVPB 500 mg  Status:  Discontinued        500 mg 100 mL/hr over 60 Minutes Intravenous  Once 03/18/24 0308 03/18/24 0314   03/18/24 0400  ceFEPIme  (MAXIPIME ) 2 g in sodium chloride  0.9 % 100 mL IVPB  Status:  Discontinued        2 g 200 mL/hr over 30 Minutes Intravenous  Once 03/18/24 0308 03/18/24 0314   03/17/24 2330  vancomycin  (VANCOCIN ) IVPB 1000 mg/200 mL premix        1,000 mg 200 mL/hr over 60 Minutes Intravenous  Once 03/17/24 2322 03/18/24 0238   03/17/24 2215  ceFEPIme  (MAXIPIME ) 2 g in sodium chloride  0.9 % 100 mL IVPB        2 g 200 mL/hr over 30 Minutes Intravenous  Once 03/17/24 2208 03/17/24 2250   03/17/24 2215  metroNIDAZOLE  (FLAGYL ) IVPB 500 mg        500 mg 100 mL/hr over 60 Minutes  Intravenous  Once 03/17/24 2208 03/18/24 0005         Subjective: Patient laying in bed.  Patient with complaints of nausea this morning stated just received IV Zofran .  Denies any chest pain or shortness of breath.  No abdominal pain.  Stool improving with consistency and frequency.  Granddaughter at bedside.   Objective: Vitals:   03/24/24 1347 03/24/24 2040 03/25/24 0500 03/25/24 9394  BP: (!) 138/95 (!) 153/74  (!) 148/89  Pulse: 73 66  61  Resp: 18 14  16   Temp: 98.4 F (36.9 C) 98.7 F (37.1 C)  98.7 F (37.1 C)  TempSrc: Oral Oral  Oral  SpO2: 98% 96%  95%  Weight:   69.8 kg   Height:        Intake/Output Summary (Last 24 hours) at 03/25/2024 1233 Last data filed at 03/25/2024 1000 Gross per 24 hour  Intake 600 ml  Output 1600 ml  Net -1000 ml   Filed Weights   03/23/24 0534 03/24/24 0500 03/25/24 0500  Weight: 69.4 kg 69.2 kg 69.8 kg    Examination:  General exam: NAD. Respiratory system: CTAB.  No wheezes, no crackles, no rhonchi.  Fair air movement.  Speaking in full sentences. Cardiovascular system: Irregularly irregular.  No JVD, no murmurs rubs or gallops.  No pitting lower extremity edema.    Gastrointestinal system: Abdomen is soft, nontender, nondistended, positive bowel sounds.  No rebound.  No guarding.  Central nervous system: Alert and oriented. No focal neurological deficits. Extremities: Symmetric 5 x 5 power. Skin: No rashes, lesions or ulcers Psychiatry: Judgement and insight appear normal. Mood & affect appropriate.     Data Reviewed: I have personally reviewed following labs and imaging studies  CBC: Recent Labs  Lab 03/19/24 0139 03/19/24 0726 03/20/24 0725 03/21/24 0427 03/22/24 0501 03/23/24 0329 03/24/24 0342 03/25/24 0955  WBC 6.4  --  5.2 6.0 5.3  --   --   --   NEUTROABS  --   --  3.5 4.0  --   --   --   --   HGB 10.8*   < > 11.0* 11.0* 11.4* 11.2* 11.1* 11.7*  HCT 35.0*   < > 35.0* 36.5 37.1 36.5 37.9 38.7  MCV 89.5  --   89.5 90.1 90.3  --   --   --   PLT 175  --  195 209 213  --   --   --    < > = values in this interval not displayed.    Basic Metabolic Panel: Recent Labs  Lab 03/19/24 0139 03/20/24 0725 03/21/24 0427 03/22/24 0501 03/23/24 0329 03/24/24 0342 03/25/24 0955  NA 133* 134* 140 139 141 140 141  K 4.7 4.0 3.8 3.2* 4.0 4.0 4.0  CL 109 108 113* 112* 112* 109 107  CO2 18* 17* 18* 21* 22 23 24   GLUCOSE 116* 99 92 133* 85 100* 98  BUN 29* 23 19 15 12 10 10   CREATININE 1.97* 1.82* 1.47* 1.16* 1.07* 0.97 1.00  CALCIUM  9.0 9.1 9.4 9.0 9.1 9.1 9.3  MG 2.6* 2.0 1.7 1.9 1.6* 2.1  --   PHOS 2.4* 2.7 3.1  --  2.7  --   --     GFR: Estimated Creatinine Clearance: 43.4 mL/min (by C-G formula based on SCr of 1 mg/dL).  Liver Function Tests: Recent Labs  Lab 03/19/24 0139 03/20/24 0725 03/21/24 0427 03/23/24 0329  AST 15  --   --   --   ALT 16  --   --   --   ALKPHOS 57  --   --   --   BILITOT 1.1  --   --   --   PROT 5.1*  --   --   --   ALBUMIN 2.4* 2.4* 2.5* 2.4*    CBG: Recent Labs  Lab 03/24/24 1247 03/24/24 1649 03/24/24 2143 03/25/24 0758 03/25/24  1103  GLUCAP 95 111* 102* 94 111*     Recent Results (from the past 240 hours)  Blood Culture (routine x 2)     Status: Abnormal   Collection Time: 03/17/24 10:06 PM   Specimen: BLOOD  Result Value Ref Range Status   Specimen Description   Final    BLOOD BLOOD RIGHT ARM Performed at [facility], [address] [place], [place] [address]    Special Requests   Final    BOTTLES DRAWN AEROBIC ONLY Blood Culture results may not be optimal due to an inadequate volume of blood received in culture bottles Performed at [facility], [address], [place], [place] [address]    Culture  Setup Time   Final    GRAM POSITIVE RODS AEROBIC BOTTLE ONLY CRITICAL RESULT CALLED TO, READ BACK BY AND VERIFIED WITH: PHARMD MELISSA J. [id number] AT 1106, ADC    Culture (A)  Final    BACILLUS  SPECIES BACILLUS MEGATERIUM Standardized susceptibility testing for this organism is not available. Performed at [facility], [address], [place], [place] [address]    Report Status 03/24/2024 FINAL  Final  Resp panel by RT-PCR (RSV, Flu A&B, Covid) Anterior Nasal Swab     Status: None   Collection Time: 03/17/24 11:08 PM   Specimen: Anterior Nasal Swab  Result Value Ref Range Status   SARS Coronavirus 2 by RT PCR NEGATIVE NEGATIVE Final    Comment: (NOTE) SARS-CoV-2 target nucleic acids are NOT DETECTED.  The SARS-CoV-2 RNA is generally detectable in upper respiratory specimens during the acute phase of infection. The lowest concentration of SARS-CoV-2 viral copies this assay can detect is 138 copies/mL. A negative result does not preclude SARS-Cov-2 infection and should not be used as the sole basis for treatment or other patient management decisions. A negative result may occur with  improper specimen collection/handling, submission of specimen other than nasopharyngeal swab, presence of viral mutation(s) within the areas targeted by this assay, and inadequate number of viral copies(<138 copies/mL). A negative result must be combined with clinical observations, patient history, and epidemiological information. The expected result is Negative.  Fact Sheet for Patients:  BloggerCourse.com  Fact Sheet for Healthcare Providers:  SeriousBroker.it  This test is no t yet approved or cleared by the [place]  FDA and  has been authorized for detection and/or diagnosis of SARS-CoV-2 by FDA under an Emergency Use Authorization (EUA). This EUA will remain  in effect (meaning this test can be used) for the duration of the COVID-19 declaration under Section 564(b)(1) of the Act, 21 U.S.C.section 360bbb-3(b)(1), unless the authorization is terminated  or revoked sooner.       Influenza A by PCR NEGATIVE NEGATIVE Final    Influenza B by PCR NEGATIVE NEGATIVE Final    Comment: (NOTE) The Xpert Xpress SARS-CoV-2/FLU/RSV plus assay is intended as an aid in the diagnosis of influenza from Nasopharyngeal swab specimens and should not be used as a sole basis for treatment. Nasal washings and aspirates are unacceptable for Xpert Xpress SARS-CoV-2/FLU/RSV testing.  Fact Sheet for Patients: BloggerCourse.com  Fact Sheet for Healthcare Providers: SeriousBroker.it  This test is not yet approved or cleared by the [place]  FDA and has been authorized for detection and/or diagnosis of SARS-CoV-2 by FDA under an Emergency Use Authorization (EUA). This EUA will remain in effect (meaning this test can be used) for the duration of the COVID-19 declaration under Section 564(b)(1) of the Act, 21  U.S.C. section 360bbb-3(b)(1), unless the authorization is terminated or revoked.     Resp Syncytial Virus by PCR NEGATIVE NEGATIVE Final    Comment: (NOTE) Fact Sheet for Patients: BloggerCourse.com  Fact Sheet for Healthcare Providers: SeriousBroker.it  This test is not yet approved or cleared by the [place]  FDA and has been authorized for detection and/or diagnosis of SARS-CoV-2 by FDA under an Emergency Use Authorization (EUA). This EUA will remain in effect (meaning this test can be used) for the duration of the COVID-19 declaration under Section 564(b)(1) of the Act, 21 U.S.C. section 360bbb-3(b)(1), unless the authorization is terminated or revoked.  Performed at [facility], [address] [place], [place] [address]   Blood Culture (routine x 2)     Status: None   Collection Time: 03/17/24 11:35 PM   Specimen: BLOOD LEFT HAND  Result Value Ref Range Status   Specimen Description   Final    BLOOD LEFT HAND Performed at [facility], [address]  [place], [place] [address]    Special Requests   Final    BOTTLES DRAWN AEROBIC ONLY Blood Culture results may not be optimal due to an inadequate volume of blood received in culture bottles Performed at [facility], [address] [place], [place] [address]    Culture   Final    NO GROWTH 5 DAYS Performed at [facility], [address] [place], [place] [address]    Report Status 03/23/2024 FINAL  Final  Urine Culture (for pregnant, neutropenic or urologic patients or patients with an indwelling urinary catheter)     Status: Abnormal   Collection Time: 03/18/24 12:04 AM   Specimen: Urine, Clean Catch  Result Value Ref Range Status   Specimen Description   Final    URINE, CLEAN CATCH Performed at [facility], [address] [place], [place] [address]    Special Requests   Final    NONE Performed at [facility], [address], [place], [place] [address]    Culture 60,000 COLONIES/mL ESCHERICHIA COLI (A)  Final   Report Status 03/21/2024 FINAL  Final   Organism ID, Bacteria ESCHERICHIA COLI (A)  Final      Susceptibility   Escherichia coli - MIC*    AMPICILLIN >=32 RESISTANT Resistant     CEFAZOLIN  <=4 SENSITIVE Sensitive     CEFEPIME  <=0.12 SENSITIVE Sensitive     CEFTRIAXONE  <=0.25 SENSITIVE Sensitive     CIPROFLOXACIN  <=0.25 SENSITIVE Sensitive     GENTAMICIN 4 SENSITIVE Sensitive     IMIPENEM <=0.25 SENSITIVE Sensitive     NITROFURANTOIN 64 INTERMEDIATE Intermediate     TRIMETH/SULFA <=20 SENSITIVE Sensitive     AMPICILLIN/SULBACTAM 8 SENSITIVE Sensitive     PIP/TAZO <=4 SENSITIVE Sensitive ug/mL    * 60,000 COLONIES/mL ESCHERICHIA COLI  C Difficile Quick Screen w PCR reflex     Status: Abnormal   Collection Time: 03/18/24  1:44 AM   Specimen: STOOL  Result Value Ref Range Status   C Diff antigen POSITIVE (A) NEGATIVE Final   C Diff toxin NEGATIVE NEGATIVE Final   C Diff interpretation Results are  indeterminate. See PCR results.  Final    Comment: Performed at [facility], [address] [place], [place] [address]  Gastrointestinal Panel by PCR , Stool     Status: None   Collection Time: 03/18/24  1:44 AM   Specimen: Stool  Result Value Ref  Range Status   Campylobacter species NOT DETECTED NOT DETECTED Final   Plesimonas shigelloides NOT DETECTED NOT DETECTED Final   Salmonella species NOT DETECTED NOT DETECTED Final   Yersinia enterocolitica NOT DETECTED NOT DETECTED Final   Vibrio species NOT DETECTED NOT DETECTED Final   Vibrio cholerae NOT DETECTED NOT DETECTED Final   Enteroaggregative E coli (EAEC) NOT DETECTED NOT DETECTED Final   Enteropathogenic E coli (EPEC) NOT DETECTED NOT DETECTED Final   Enterotoxigenic E coli (ETEC) NOT DETECTED NOT DETECTED Final   Shiga like toxin producing E coli (STEC) NOT DETECTED NOT DETECTED Final   Shigella/Enteroinvasive E coli (EIEC) NOT DETECTED NOT DETECTED Final   Cryptosporidium NOT DETECTED NOT DETECTED Final   Cyclospora cayetanensis NOT DETECTED NOT DETECTED Final   Entamoeba histolytica NOT DETECTED NOT DETECTED Final   Giardia lamblia NOT DETECTED NOT DETECTED Final   Adenovirus F40/41 NOT DETECTED NOT DETECTED Final   Astrovirus NOT DETECTED NOT DETECTED Final   Norovirus GI/GII NOT DETECTED NOT DETECTED Final   Rotavirus A NOT DETECTED NOT DETECTED Final   Sapovirus (I, II, IV, and V) NOT DETECTED NOT DETECTED Final    Comment: Performed at [facility], [address] [place], [place] [address]  C. Diff by PCR, Reflexed     Status: None   Collection Time: 03/18/24  1:44 AM  Result Value Ref Range Status   Toxigenic C. Difficile by PCR NEGATIVE NEGATIVE Final    Comment: Patient is colonized with non toxigenic C. difficile. May not need treatment unless significant symptoms are present. Performed at [facility], [address], [place], [place] [address]   MRSA Next Gen by PCR, Nasal      Status: None   Collection Time: 03/18/24  3:45 AM   Specimen: Nasal Mucosa; Nasal Swab  Result Value Ref Range Status   MRSA by PCR Next Gen NOT DETECTED NOT DETECTED Final    Comment: (NOTE) The GeneXpert MRSA Assay (FDA approved for NASAL specimens only), is one component of a comprehensive MRSA colonization surveillance program. It is not intended to diagnose MRSA infection nor to guide or monitor treatment for MRSA infections. Test performance is not FDA approved in patients less than 26 years old. Performed at [facility], [address], [place], [place] [address]          Radiology Studies: No results found.       Scheduled Meds:  apixaban   5 mg Oral BID   bisoprolol   2.5 mg Oral Daily   buPROPion   75 mg Oral q morning   cefadroxil   1,000 mg Oral BID   Chlorhexidine  Gluconate Cloth  6 each Topical Daily   cholecalciferol   1,000 Units Oral Daily   colestipol   1 g Oral BID   FLUoxetine   30 mg Oral QHS   gabapentin   300 mg Oral BID   insulin  aspart  0-15 Units Subcutaneous TID WC   levothyroxine   88 mcg Oral Q0600   pantoprazole   40 mg Oral BID   psyllium  1 packet Oral Daily   saccharomyces boulardii  250 mg Oral BID   Continuous Infusions:     LOS: 7 days    Time spent: 35 minutes    Toribio Hummer, MD [facility]   To contact the attending provider between 7A-7P or the covering provider during after hours 7P-7A, please log into the web site [email or web address] and access using universal [place] password for that web site. If  you do not have the password, please call the hospital operator.  03/25/2024, 12:33 PM

## 2024-03-25 NOTE — TOC Progression Note (Addendum)
 Transition of Care [facility]) - Progression Note    Patient Details  Name: [name] MRN: [id number] Date of Birth: 1945-12-31  Transition of Care [facility]) CM/SW Contact  Katie CHRISTELLA Eva, LCSW Phone Number: 03/25/2024, 12:10 PM  Clinical Narrative:     Bed offers presented , pt is requesting time to review. Care management to follow.   Adden  12:30pm CSW received a call from pt's daughter Earlean , she stated the pt would like [employer]. CSW sent message to admission with [facility] and will start insurance authorization. Care management to follow.   Expected Discharge Plan:  (TBD) Barriers to Discharge: Continued Medical Work up               Expected Discharge Plan and Services                                               Social Drivers of Health (SDOH) Interventions SDOH Screenings   Food Insecurity: No Food Insecurity (03/18/2024)  Housing: Low Risk  (03/18/2024)  Transportation Needs: No Transportation Needs (03/18/2024)  Utilities: Not At Risk (03/18/2024)  Depression (PHQ2-9): Low Risk  (02/28/2023)  Social Connections: Socially Integrated (03/18/2024)  Tobacco Use: Medium Risk (03/18/2024)    Readmission Risk Interventions    01/27/2024   10:41 AM 01/03/2024   12:11 PM 05/23/2023   10:20 AM  Readmission Risk Prevention Plan  Transportation Screening Complete Complete Complete  PCP or Specialist Appt within 5-7 Days Complete  Complete  PCP or Specialist Appt within 3-5 Days  Complete   Home Care Screening Complete  Complete  Medication Review (RN CM) Complete  Complete  HRI or Home Care Consult  Complete   Social Work Consult for Recovery Care Planning/Counseling  Complete   Palliative Care Screening  Complete   Medication Review [employer])  Complete

## 2024-03-26 DIAGNOSIS — R579 Shock, unspecified: Secondary | ICD-10-CM | POA: Diagnosis not present

## 2024-03-26 LAB — GLUCOSE, CAPILLARY
Glucose-Capillary: 95 mg/dL (ref 70–99)
Glucose-Capillary: 107 mg/dL — ABNORMAL HIGH (ref 70–99)
Glucose-Capillary: 96 mg/dL (ref 70–99)
Glucose-Capillary: 100 mg/dL — ABNORMAL HIGH (ref 70–99)

## 2024-03-26 LAB — HEPATIC FUNCTION PANEL
ALT: 8 U/L (ref 0–44)
AST: 14 U/L — ABNORMAL LOW (ref 15–41)
Albumin: 2.5 g/dL — ABNORMAL LOW (ref 3.5–5.0)
Alkaline Phosphatase: 57 U/L (ref 38–126)
Bilirubin, Direct: 0.1 mg/dL (ref 0.0–0.2)
Indirect Bilirubin: 0.4 mg/dL (ref 0.3–0.9)
Total Bilirubin: 0.5 mg/dL (ref 0.0–1.2)
Total Protein: 5.5 g/dL — ABNORMAL LOW (ref 6.5–8.1)

## 2024-03-26 LAB — URINALYSIS, ROUTINE W REFLEX MICROSCOPIC
Bilirubin Urine: NEGATIVE
Glucose, UA: NEGATIVE mg/dL
Ketones, ur: NEGATIVE mg/dL
Nitrite: NEGATIVE
Protein, ur: 100 mg/dL — AB
RBC / HPF: >50 RBC/hpf (ref 0–5)
Specific Gravity, Urine: 1.009 (ref 1.005–1.030)
WBC, UA: >50 WBC/hpf (ref 0–5)
pH: 6 (ref 5.0–8.0)

## 2024-03-26 LAB — RENAL FUNCTION PANEL
Albumin: 2.5 g/dL — ABNORMAL LOW (ref 3.5–5.0)
Anion gap: 11 (ref 5–15)
BUN: 9 mg/dL (ref 8–23)
CO2: 22 mmol/L (ref 22–32)
Calcium: 8.7 mg/dL — ABNORMAL LOW (ref 8.9–10.3)
Chloride: 103 mmol/L (ref 98–111)
Creatinine, Ser: 1.06 mg/dL — ABNORMAL HIGH (ref 0.44–1.00)
GFR, Estimated: 54 mL/min — ABNORMAL LOW (ref >60)
Glucose, Bld: 92 mg/dL (ref 70–99)
Phosphorus: 3.4 mg/dL (ref 2.5–4.6)
Potassium: 3.7 mmol/L (ref 3.5–5.1)
Sodium: 136 mmol/L (ref 135–145)

## 2024-03-26 LAB — HEMOGLOBIN AND HEMATOCRIT, BLOOD
HCT: 37.3 % (ref 36.0–46.0)
Hemoglobin: 11.3 g/dL — ABNORMAL LOW (ref 12.0–15.0)

## 2024-03-26 LAB — MAGNESIUM: Magnesium: 1.6 mg/dL — ABNORMAL LOW (ref 1.7–2.4)

## 2024-03-26 MED ORDER — MAGNESIUM SULFATE 2 GM/50ML IV SOLN
2.0000 g | Freq: Once | INTRAVENOUS | Status: AC
  Administered 2024-03-26: 2 g via INTRAVENOUS
  Filled 2024-03-26: qty 50

## 2024-03-26 MED ORDER — LACTATED RINGERS IV SOLN: INTRAVENOUS | Status: AC

## 2024-03-26 NOTE — Progress Notes (Signed)
 PROGRESS NOTE    [name]  FMW:[id number] DOB: [birth year] DOA: 03/17/2024 PCP: Janey Santos, MD   Brief Narrative: 78 year old with past medical history of persistent A-fib, on Eliquis , hypomagnesemia, hypothyroidism, who was brought to family for evaluation after patient was noted to have altered mental status.  Patient has had multiple days with diarrhea.  Patient had previously dark his stool, which resolved after she stopped taking iron supplements.  She does have a history of hematuria and she has had clots.  She had a cystoscopy as an outpatient and was noted to have cracks in her bladder per patient urology think that her hematuria is related to Eliquis .  She was having fevers at home.  Patient was noted to be hypotensive she required low-dose of norepinephrine , she was admitted by CCM.  Urology was consulted due to concern for left hydronephrosis.  Patient was admitted with hypotensive shock, acute cystitis and hematuria  Assessment & Plan:   Principal Problem:   Shock (HCC) Active Problems:   Hypokalemia   Acute cystitis   Persistent atrial fibrillation (HCC)   Anticoagulant long-term use   Acute encephalopathy   Hypomagnesemia   Hypothyroidism   Acute metabolic encephalopathy   Acute renal failure superimposed on stage 3a chronic kidney disease (HCC)   Hypophosphatemia   Acute blood loss anemia   Anemia of chronic disease   1-Hypotensive shock In the setting of volume depletion and probably UTI.  Patient required pressors and has been weaned off. -GI pathogen was negative.  C. difficile PCR noted to be positive for C. difficile antigen, C. difficile toxin negative, likely colonization of C. Difficile - Vancomycin   was discontinue and Flagyl  as well.  Patient was treated with cefazolin  and was transitioned to Duricef to complete course of antibiotics for 7 days Blood pressure has remained stable  2-Acute cystitis E. coli UTI UA concerning for UTI urine  culture grew 60,000 colonies of E. coli.  Sensitive to cephalosporin. Was treated with IV cefazolin  and transition to Duricef to complete 7 days course  Acute metabolic encephalopathy altered mental status in the setting of infection.  Back to baseline  Acute on chronic diarrhea: GI pathogen negative.  C. difficile antigen positive C. difficile toxin and PCR negative, likely conization. -Family  insisted on GI consultation and GI was consulted. GI think bowel habits likely secondary to irritable bowel functional in the setting of prior C. 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 Difficile  Hematuria/left hydronephrosis/solitary kidney Has had intermittent hematuria exacerbated by Eliquis  Undergo cystoscopy a year ago by Dr. Selma CT abdomen and pelvis and renal ultrasound showed left hydronephrosis not completely new but worse from prior going all the way down to the UVJ.  Urology to review imaging no evidence of acute obstruction.  Follow-up as an outpatient Patient was restarted on Eliquis  on 80/4 after discussion with urology.  She has now developed again hematuria Hold Eliquis , urology reconsulted, checking UA  AKI on CKD stage IIIA status post right nephrectomy remote Painted with AKI with a creatinine of 2.5 last creatinine 1.5 01/2024 Setting of hypotension Renal ultrasound and CT showed mild left hydronephrosis.  Urology consulted no further recommendation in-house Renal Function improved with IV fluids  Hypomagnesemia, hypokalemia hypophosphatemia: Replace  Non Anion gap metabolic acidosis in the setting of diarrhea improved with bicarb tablets now discontinue  Chronic  A-fib: Continue bisoprolol  Anticoagulation initially held due to hematuria and required 2 unit of packed red blood cell. Dr. Sebastian discussed with urology on 8/4 and  Eliquis  was resumed Developed hematuria again, plan to hold Eliquis  for now  Acute blood loss anemia/anemia of chronic disease Send with a hemoglobin of 6, hypotension and hematuria He received Kcentra , received 2 units of packed red blood cell.  Hemoglobin has remained stable at 11  Hypothyroidism: Continue with Synthroid    Estimated body mass index is 24.13 kg/m as calculated from the following:   Height as of this encounter: 5' 6 (1.676 m).   Weight as of this encounter: 67.8 kg.   DVT prophylaxis: SCD Code Status: Full code Family Communication:Daughter at bedside Disposition Plan:  Status is: Inpatient Remains inpatient appropriate because: management of hematuria    Consultants:  Urology GI  Procedures:    Antimicrobials:    Subjective: She report recurrence of bloody urine, is very dark. Also report dysuria. Has PureWick  Objective: Vitals:   03/25/24 2109 03/26/24 0500 03/26/24 0605 03/26/24 1343  BP: 139/79  (!) 149/87 136/85  Pulse: (!) 51  (!) 56 81  Resp: 16  14 18   Temp: 98.9 F (37.2 C)  97.7 F (36.5 C) 98.8 F (37.1 C)  TempSrc: Oral  Oral Oral  SpO2: 94%  94% 94%  Weight:  67.8 kg    Height:        Intake/Output Summary (Last 24 hours) at 03/26/2024 1550 Last data filed at 03/26/2024 0930 Gross per 24 hour  Intake 240 ml  Output 500 ml  Net -260 ml   Filed Weights   03/24/24 0500 03/25/24 0500 03/26/24 0500  Weight: 69.2 kg 69.8 kg 67.8 kg    Examination:  General exam: Appears calm and comfortable  Respiratory system: Clear to auscultation. Respiratory effort normal. Cardiovascular system: S1 & S2 heard, RRR. No JVD, murmurs, rubs, gallops or clicks. No pedal edema. Gastrointestinal system: Abdomen is nondistended, soft and nontender. No organomegaly or masses felt. Normal bowel sounds heard. Central nervous system: Alert and oriented. No focal neurological deficits. Extremities: Symmetric 5 x 5 power.   Data Reviewed: I have  personally reviewed following labs and imaging studies  CBC: Recent Labs  Lab 03/20/24 0725 03/21/24 0427 03/22/24 0501 03/23/24 0329 03/24/24 0342 03/25/24 0955 03/26/24 0426  WBC 5.2 6.0 5.3  --   --   --   --   NEUTROABS 3.5 4.0  --   --   --   --   --   HGB 11.0* 11.0* 11.4* 11.2* 11.1* 11.7* 11.3*  HCT 35.0* 36.5 37.1 36.5 37.9 38.7 37.3  MCV 89.5 90.1 90.3  --   --   --   --   PLT 195 209 213  --   --   --   --    Basic Metabolic Panel: Recent Labs  Lab 03/20/24 0725 03/21/24 0427 03/22/24 0501 03/23/24 0329 03/24/24 0342 03/25/24 0955 03/26/24 0426  NA 134* 140 139 141 140 141 136  K 4.0 3.8 3.2* 4.0 4.0 4.0 3.7  CL 108 113* 112* 112* 109 107 103  CO2 17* 18* 21* 22 23 24 22   GLUCOSE 99 92 133* 85 100* 98 92  BUN 23 19 15 12 10 10 9   CREATININE 1.82* 1.47* 1.16* 1.07* 0.97 1.00 1.06*  CALCIUM  9.1 9.4 9.0 9.1 9.1 9.3 8.7*  MG 2.0 1.7 1.9 1.6* 2.1  --  1.6*  PHOS 2.7 3.1  --  2.7  --   --  3.4   GFR: Estimated Creatinine Clearance: 40.9 mL/min (A) (by C-G formula based on SCr of 1.06 mg/dL (H)). Liver Function Tests: Recent Labs  Lab 03/20/24 0725 03/21/24 0427 03/23/24 0329 03/26/24 0426 03/26/24 1234  AST  --   --   --   --  14*  ALT  --   --   --   --  8  ALKPHOS  --   --   --   --  57  BILITOT  --   --   --   --  0.5  PROT  --   --   --   --  5.5*  ALBUMIN 2.4* 2.5* 2.4* 2.5* 2.5*   No results for input(s): LIPASE, AMYLASE in the last 168 hours. No results for input(s): AMMONIA in the last 168 hours. Coagulation Profile: No results for input(s): INR, PROTIME in the last 168 hours. Cardiac Enzymes: No results for input(s): CKTOTAL, CKMB, CKMBINDEX, TROPONINI in the last 168 hours. BNP (last 3 results) No results for input(s): PROBNP in the last 8760 hours. HbA1C: No results for input(s): HGBA1C in the last 72 hours. CBG: Recent Labs  Lab 03/25/24 1103 03/25/24 1650 03/25/24 2111 03/26/24 0745 03/26/24 1136   GLUCAP 111* 99 116* 95 107*   Lipid Profile: No results for input(s): CHOL, HDL, LDLCALC, TRIG, CHOLHDL, LDLDIRECT in the last 72 hours. Thyroid  Function Tests: No results for input(s): TSH, T4TOTAL, FREET4, T3FREE, THYROIDAB in the last 72 hours. Anemia Panel: No results for input(s): VITAMINB12, FOLATE, FERRITIN, TIBC, IRON, RETICCTPCT in the last 72 hours. Sepsis Labs: No results for input(s): PROCALCITON, LATICACIDVEN in the last 168 hours.  Recent Results (from the past 240 hours)  Blood Culture (routine x 2)     Status: Abnormal   Collection Time: 03/17/24 10:06 PM   Specimen: BLOOD  Result Value Ref Range Status   Specimen Description   Final    BLOOD BLOOD RIGHT ARM Performed at [facility], [address], [place], [place] [address]    Special Requests   Final    BOTTLES DRAWN AEROBIC ONLY Blood Culture results may not be optimal due to an inadequate volume of blood received in culture bottles Performed at [facility], [address] [place], [place] [address]    Culture  Setup Time   Final    GRAM POSITIVE RODS AEROBIC BOTTLE ONLY CRITICAL RESULT CALLED TO, READ BACK BY AND VERIFIED WITH: PHARMD MELISSA J. [id number] AT 1106, ADC    Culture (A)  Final    BACILLUS SPECIES BACILLUS MEGATERIUM Standardized susceptibility testing for this organism is not available. Performed at [facility], [address] [place], [place] [address]    Report Status 03/24/2024 FINAL  Final  Resp panel by RT-PCR (RSV, Flu A&B, Covid) Anterior Nasal Swab     Status: None   Collection Time: 03/17/24 11:08 PM   Specimen: Anterior Nasal Swab  Result Value Ref Range Status   SARS Coronavirus 2 by RT PCR NEGATIVE NEGATIVE Final    Comment: (NOTE) SARS-CoV-2 target nucleic acids are NOT DETECTED.  The SARS-CoV-2 RNA is generally detectable in upper respiratory specimens during the acute phase of infection.  The lowest concentration of SARS-CoV-2 viral copies this assay can detect is 138 copies/mL. A negative result does not preclude SARS-Cov-2 infection and should not be used as the sole basis for treatment or other patient management decisions. A negative  result may occur with  improper specimen collection/handling, submission of specimen other than nasopharyngeal swab, presence of viral mutation(s) within the areas targeted by this assay, and inadequate number of viral copies(<138 copies/mL). A negative result must be combined with clinical observations, patient history, and epidemiological information. The expected result is Negative.  Fact Sheet for Patients:  BloggerCourse.com  Fact Sheet for Healthcare Providers:  SeriousBroker.it  This test is no t yet approved or cleared by the [place]  FDA and  has been authorized for detection and/or diagnosis of SARS-CoV-2 by FDA under an Emergency Use Authorization (EUA). This EUA will remain  in effect (meaning this test can be used) for the duration of the COVID-19 declaration under Section 564(b)(1) of the Act, 21 U.S.C.section 360bbb-3(b)(1), unless the authorization is terminated  or revoked sooner.       Influenza A by PCR NEGATIVE NEGATIVE Final   Influenza B by PCR NEGATIVE NEGATIVE Final    Comment: (NOTE) The Xpert Xpress SARS-CoV-2/FLU/RSV plus assay is intended as an aid in the diagnosis of influenza from Nasopharyngeal swab specimens and should not be used as a sole basis for treatment. Nasal washings and aspirates are unacceptable for Xpert Xpress SARS-CoV-2/FLU/RSV testing.  Fact Sheet for Patients: BloggerCourse.com  Fact Sheet for Healthcare Providers: SeriousBroker.it  This test is not yet approved or cleared by the [place]  FDA and has been authorized for detection and/or diagnosis of SARS-CoV-2 by FDA  under an Emergency Use Authorization (EUA). This EUA will remain in effect (meaning this test can be used) for the duration of the COVID-19 declaration under Section 564(b)(1) of the Act, 21 U.S.C. section 360bbb-3(b)(1), unless the authorization is terminated or revoked.     Resp Syncytial Virus by PCR NEGATIVE NEGATIVE Final    Comment: (NOTE) Fact Sheet for Patients: BloggerCourse.com  Fact Sheet for Healthcare Providers: SeriousBroker.it  This test is not yet approved or cleared by the [place]  FDA and has been authorized for detection and/or diagnosis of SARS-CoV-2 by FDA under an Emergency Use Authorization (EUA). This EUA will remain in effect (meaning this test can be used) for the duration of the COVID-19 declaration under Section 564(b)(1) of the Act, 21 U.S.C. section 360bbb-3(b)(1), unless the authorization is terminated or revoked.  Performed at [facility], [address], [place], [place] [address]   Blood Culture (routine x 2)     Status: None   Collection Time: 03/17/24 11:35 PM   Specimen: BLOOD LEFT HAND  Result Value Ref Range Status   Specimen Description   Final    BLOOD LEFT HAND Performed at [facility], [address], [place], [place] [address]    Special Requests   Final    BOTTLES DRAWN AEROBIC ONLY Blood Culture results may not be optimal due to an inadequate volume of blood received in culture bottles Performed at [facility], [address], [place], [place] [address]    Culture   Final    NO GROWTH 5 DAYS Performed at [facility], [address], [place], [place] [address]    Report Status 03/23/2024 FINAL  Final  Urine Culture (for pregnant, neutropenic or urologic patients or patients with an indwelling urinary catheter)     Status: Abnormal   Collection Time: 03/18/24 12:04 AM   Specimen: Urine, Clean Catch  Result  Value Ref Range Status   Specimen Description   Final    URINE, CLEAN CATCH Performed at [facility], [address] [place], [place] [address]    Special Requests   Final    NONE Performed at [facility], [address] [place], [place] [address]    Culture 60,000 COLONIES/mL ESCHERICHIA COLI (A)  Final   Report Status 03/21/2024 FINAL  Final   Organism ID, Bacteria ESCHERICHIA COLI (A)  Final      Susceptibility   Escherichia coli - MIC*    AMPICILLIN >=32 RESISTANT Resistant     CEFAZOLIN  <=4 SENSITIVE Sensitive     CEFEPIME  <=0.12 SENSITIVE Sensitive     CEFTRIAXONE  <=0.25 SENSITIVE Sensitive     CIPROFLOXACIN  <=0.25 SENSITIVE Sensitive     GENTAMICIN 4 SENSITIVE Sensitive     IMIPENEM <=0.25 SENSITIVE Sensitive     NITROFURANTOIN 64 INTERMEDIATE Intermediate     TRIMETH/SULFA <=20 SENSITIVE Sensitive     AMPICILLIN/SULBACTAM 8 SENSITIVE Sensitive     PIP/TAZO <=4 SENSITIVE Sensitive ug/mL    * 60,000 COLONIES/mL ESCHERICHIA COLI  C Difficile Quick Screen w PCR reflex     Status: Abnormal   Collection Time: 03/18/24  1:44 AM   Specimen: STOOL  Result Value Ref Range Status   C Diff antigen POSITIVE (A) NEGATIVE Final   C Diff toxin NEGATIVE NEGATIVE Final   C Diff interpretation Results are indeterminate. See PCR results.  Final    Comment: Performed at [facility], [address], [place], [place] [address]  Gastrointestinal Panel by PCR , Stool     Status: None   Collection Time: 03/18/24  1:44 AM   Specimen: Stool  Result Value Ref Range Status   Campylobacter species NOT DETECTED NOT DETECTED Final   Plesimonas shigelloides NOT DETECTED NOT DETECTED Final   Salmonella species NOT DETECTED NOT DETECTED Final   Yersinia enterocolitica NOT DETECTED NOT DETECTED Final   Vibrio species NOT DETECTED NOT DETECTED Final   Vibrio cholerae NOT DETECTED NOT DETECTED Final   Enteroaggregative E coli (EAEC) NOT  DETECTED NOT DETECTED Final   Enteropathogenic E coli (EPEC) NOT DETECTED NOT DETECTED Final   Enterotoxigenic E coli (ETEC) NOT DETECTED NOT DETECTED Final   Shiga like toxin producing E coli (STEC) NOT DETECTED NOT DETECTED Final   Shigella/Enteroinvasive E coli (EIEC) NOT DETECTED NOT DETECTED Final   Cryptosporidium NOT DETECTED NOT DETECTED Final   Cyclospora cayetanensis NOT DETECTED NOT DETECTED Final   Entamoeba histolytica NOT DETECTED NOT DETECTED Final   Giardia lamblia NOT DETECTED NOT DETECTED Final   Adenovirus F40/41 NOT DETECTED NOT DETECTED Final   Astrovirus NOT DETECTED NOT DETECTED Final   Norovirus GI/GII NOT DETECTED NOT DETECTED Final   Rotavirus A NOT DETECTED NOT DETECTED Final   Sapovirus (I, II, IV, and V) NOT DETECTED NOT DETECTED Final    Comment: Performed at [facility] [address] [place], [place] [address]  C. Diff by PCR, Reflexed     Status: None   Collection Time: 03/18/24  1:44 AM  Result Value Ref Range Status   Toxigenic C. Difficile by PCR NEGATIVE NEGATIVE Final    Comment: Patient is colonized with non toxigenic C. difficile. May not need treatment unless significant symptoms are present. Performed at [facility], [address], [place], [place] [address]   MRSA Next Gen by PCR, Nasal     Status: None   Collection Time: 03/18/24  3:45 AM   Specimen: Nasal Mucosa; Nasal Swab  Result Value Ref Range Status  MRSA by PCR Next Gen NOT DETECTED NOT DETECTED Final    Comment: (NOTE) The GeneXpert MRSA Assay (FDA approved for NASAL specimens only), is one component of a comprehensive MRSA colonization surveillance program. It is not intended to diagnose MRSA infection nor to guide or monitor treatment for MRSA infections. Test performance is not FDA approved in patients less than 21 years old. Performed at [facility], [address] [place], [place] [address]          Radiology Studies: No  results found.      Scheduled Meds:  bisoprolol   2.5 mg Oral Daily   buPROPion   75 mg Oral q morning   Chlorhexidine  Gluconate Cloth  6 each Topical Daily   cholecalciferol   1,000 Units Oral Daily   colestipol   1 g Oral BID   FLUoxetine   30 mg Oral QHS   gabapentin   300 mg Oral BID   insulin  aspart  0-15 Units Subcutaneous TID WC   levothyroxine   88 mcg Oral Q0600   pantoprazole   40 mg Oral BID   psyllium  1 packet Oral Daily   saccharomyces boulardii  250 mg Oral BID   Continuous Infusions:  lactated ringers  75 mL/hr at 03/26/24 1225     LOS: 8 days    Time spent: 35 minutes    Diallo Ponder A Quintin Hjort, MD [facility]   If 7PM-7AM, please contact night-coverage [email or web address]  03/26/2024, 3:50 PM

## 2024-03-26 NOTE — Progress Notes (Signed)
 Occupational Therapy Treatment Patient Details Name: [name] MRN: [id number] DOB: Mar 19, 1946 Today's Date: 03/26/2024   History of present illness Pt is a 78 yr old female admitted on 03/17/24 after presenting with AMS with frequent falling. Pt is being treated for acute hypotension, multifactorial in origin. PMH: lung CA, recurrent C. Diff colitis, a-fib on eliquis , CVA, CKD 3A   OT comments  The pt was seen for functional strengthening and progression of functional activity. She required assist to stand using a RW, as well as for short distance ambulation using the RW. She was limited in her ability to further progress her out of bed functional activity, given reports of 8/10 bilateral knee pain, attributable to chronic arthritis. Pain increases with standing activity. Pt was then assisted to the bedside chair. Continue OT plan of care. Patient will benefit from continued inpatient follow up therapy, <3 hours/day.       If plan is discharge home, recommend the following:  Assistance with cooking/housework;Assist for transportation;Help with stairs or ramp for entrance;A lot of help with bathing/dressing/bathroom;A little help with walking and/or transfers   Equipment Recommendations  Other (comment) (defer to next level of care)    Recommendations for Other Services      Precautions / Restrictions Precautions Precautions: Fall Restrictions Weight Bearing Restrictions Per Provider Order: No       Mobility Bed Mobility Overal bed mobility: Needs Assistance Bed Mobility: Supine to Sit     Supine to sit: Supervision          Transfers Overall transfer level: Needs assistance Equipment used: Rolling walker (2 wheels) Transfers: Sit to/from Stand Sit to Stand: Min assist, From elevated surface                 Balance        Standing balance comment: Min assist with RW        ADL either performed or assessed with clinical judgement   ADL Overall ADL's :  Needs assistance/impaired            Vision Baseline Vision/History: 1 Wears glasses           Communication Communication Communication: No apparent difficulties   Cognition Arousal: Alert Behavior During Therapy: WFL for tasks assessed/performed Cognition: No apparent impairments             OT - Cognition Comments: Oriented x4      Following commands: Intact                      Pertinent Vitals/ Pain       Pain Assessment Pain Assessment: 0-10 Pain Score: 8  Pain Location: knees with ambulation Pain Descriptors / Indicators: Aching, Sore, Discomfort Pain Intervention(s): Limited activity within patient's tolerance, Monitored during session, Repositioned   Frequency  Min 2X/week        Progress Toward Goals  OT Goals(current goals can now be found in the care plan section)     Acute Rehab OT Goals OT Goal Formulation: With patient Time For Goal Achievement: 04/03/24 Potential to Achieve Goals: Good  Plan         AM-PAC OT 6 Clicks Daily Activity     Outcome Measure   Help from another person eating meals?: None Help from another person taking care of personal grooming?: A Little Help from another person toileting, which includes using toliet, bedpan, or urinal?: A Lot Help from another person bathing (including washing, rinsing, drying)?: A Lot Help  from another person to put on and taking off regular upper body clothing?: A Little Help from another person to put on and taking off regular lower body clothing?: A Lot 6 Click Score: 16    End of Session Equipment Utilized During Treatment: Gait belt;Rolling walker (2 wheels)  OT Visit Diagnosis: History of falling (Z91.81);Muscle weakness (generalized) (M62.81);Pain;Other abnormalities of gait and mobility (R26.89) Pain - part of body:  (knees due to chronic arthritis)   Activity Tolerance Patient limited by pain   Patient Left in chair;with call bell/phone within reach;with chair  alarm set   Nurse Communication Mobility status        Time: 1350-1408 OT Time Calculation (min): 18 min  Charges: OT General Charges $OT Visit: 1 Visit OT Treatments $Therapeutic Activity: 8-22 mins     Delanna LITTIE Molt, OTR/L 03/26/2024, 5:11 PM
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
## 2024-03-26 NOTE — TOC Initial Note (Incomplete)
 Transition of Care [facility]) - Initial/Assessment Note    Patient Details  Name: [name] MRN: [id number] Date of Birth: 06/06/1946  [facility]) CM/SW Contact:    Tawni CHRISTELLA Eva, LCSW Phone Number: 03/26/2024, 10:39 AM  Clinical Narrative:                   Expected Discharge Plan: Skilled Nursing Facility Barriers to Discharge: Continued Medical Work up   Patient Goals and CMS Choice            Expected Discharge Plan and Services                                              Prior Living Arrangements/Services                       Activities of Daily Living   ADL Screening (condition at time of admission) Independently performs ADLs?: No Does the patient have a NEW difficulty with bathing/dressing/toileting/self-feeding that is expected to last >3 days?: No Does the patient have a NEW difficulty with getting in/out of bed, walking, or climbing stairs that is expected to last >3 days?: No Does the patient have a NEW difficulty with communication that is expected to last >3 days?: No Is the patient deaf or have difficulty hearing?: No Does the patient have difficulty seeing, even when wearing glasses/contacts?: No Does the patient have difficulty concentrating, remembering, or making decisions?: No  Permission Sought/Granted                  Emotional Assessment              Admission diagnosis:  Shock [facility]) [R57.9] Patient Active Problem List   Diagnosis Date Noted   Hypophosphatemia 03/19/2024   Acute blood loss anemia 03/19/2024   Anemia of chronic disease 03/19/2024   Shock (HCC) 03/18/2024   Acute hypoxemic respiratory failure (HCC) 01/25/2024   Pyelonephritis 01/02/2024   Acute renal failure superimposed on stage 3a chronic kidney disease (HCC) 01/02/2024   COPD (chronic obstructive pulmonary disease) (HCC)    Gastritis and gastroduodenitis 07/12/2023   Dilated pancreatic duct 07/12/2023   Dilation  of biliary tract 07/03/2023   Acute cystitis 05/22/2023   Abdominal pain 05/22/2023   History of CVA (cerebrovascular accident) 05/22/2023   GAD (generalized anxiety disorder) 05/22/2023   Lung cancer (HCC) 03/01/2023   Lung nodule 02/15/2023   Clostridioides difficile infection 12/11/2022   Malnutrition of moderate degree (HCC) 12/08/2022   Intractable nausea and vomiting 12/07/2022   Hyponatremia 12/07/2022   Acute urinary retention 06/18/2022   Acute metabolic encephalopathy 06/17/2022   Pyuria 06/17/2022   GERD (gastroesophageal reflux disease) 06/17/2022   Hyperglycemia 06/17/2022   Low back pain 05/28/2020   Dilation of pancreatic duct 05/11/2020   Anterior dislocation of right shoulder 06/17/2019   Shoulder pain, right 06/17/2019   Duodenitis 02/24/2019   Chronic pain syndrome 02/24/2019   Debility 02/13/2019   Nausea and vomiting    Aspiration pneumonia (HCC)    Diarrhea    Pressure injury of skin 02/08/2019   Pneumonia of right lower lobe due to methicillin susceptible Staphylococcus aureus (MSSA) (HCC)    Volume overload state of heart    Acute encephalopathy 02/01/2019   Acute respiratory failure with hypoxia (HCC) 02/01/2019   Hypokalemia 02/01/2019   Hypomagnesemia  02/01/2019   Leukocytosis 02/01/2019   Osteoarthritis 02/01/2019   Hypothyroidism 02/01/2019   Anticoagulant long-term use    Persistent atrial fibrillation (HCC) 06/27/2017   Hip fracture (HCC) 06/27/2017   Irregular heart rate    Closed right hip fracture, initial encounter (HCC) 06/26/2017   Protein-calorie malnutrition, severe 06/30/2016   S/P laparoscopic cholecystectomy 06/28/2016   Visual field loss following stroke    Hyperlipidemia    Essential hypertension    PCP:  Janey Santos, MD Pharmacy:   [facility] [id number] [address] [place], [place] - [address] [place] [address] Phone: [phone] Fax: [phone]     Social Drivers of Health  (SDOH) Social History: SDOH Screenings   Food Insecurity: No Food Insecurity (03/18/2024)  Housing: Low Risk  (03/18/2024)  Transportation Needs: No Transportation Needs (03/18/2024)  Utilities: Not At Risk (03/18/2024)  Depression (PHQ2-9): Low Risk  (02/28/2023)  Social Connections: Socially Integrated (03/18/2024)  Tobacco Use: Medium Risk (03/18/2024)   SDOH Interventions:     Readmission Risk Interventions    01/27/2024   10:41 AM 01/03/2024   12:11 PM 05/23/2023   10:20 AM  Readmission Risk Prevention Plan  Transportation Screening Complete Complete Complete  PCP or Specialist Appt within 5-7 Days Complete  Complete  PCP or Specialist Appt within 3-5 Days  Complete   Home Care Screening Complete  Complete  Medication Review (RN CM) Complete  Complete  HRI or Home Care Consult  Complete   Social Work Consult for Recovery Care Planning/Counseling  Complete   Palliative Care Screening  Complete   Medication Review Oceanographer)  Complete

## 2024-03-26 NOTE — Plan of Care (Signed)

## 2024-03-26 NOTE — TOC Progression Note (Signed)
 Transition of Care [facility]) - Progression Note    Patient Details  Name: [name] MRN: [id number] Date of Birth: 03/11/1946  Transition of Care [facility]) CM/SW Contact  Tawni CHRISTELLA Eva, LCSW Phone Number: 03/26/2024, 10:36 AM  Clinical Narrative:     Pt's insurance shara was approved for SNF placement. Pt not medically stable. Care management to follow.   Expected Discharge Plan: Skilled Nursing Facility Barriers to Discharge: Continued Medical Work up               Expected Discharge Plan and Services                                               Social Drivers of Health (SDOH) Interventions SDOH Screenings   Food Insecurity: No Food Insecurity (03/18/2024)  Housing: Low Risk  (03/18/2024)  Transportation Needs: No Transportation Needs (03/18/2024)  Utilities: Not At Risk (03/18/2024)  Depression (PHQ2-9): Low Risk  (02/28/2023)  Social Connections: Socially Integrated (03/18/2024)  Tobacco Use: Medium Risk (03/18/2024)    Readmission Risk Interventions    01/27/2024   10:41 AM 01/03/2024   12:11 PM 05/23/2023   10:20 AM  Readmission Risk Prevention Plan  Transportation Screening Complete Complete Complete  PCP or Specialist Appt within 5-7 Days Complete  Complete  PCP or Specialist Appt within 3-5 Days  Complete   Home Care Screening Complete  Complete  Medication Review (RN CM) Complete  Complete  HRI or Home Care Consult  Complete   Social Work Consult for Recovery Care Planning/Counseling  Complete   Palliative Care Screening  Complete   Medication Review Oceanographer)  Complete

## 2024-03-27 DIAGNOSIS — R579 Shock, unspecified: Secondary | ICD-10-CM | POA: Diagnosis not present

## 2024-03-27 LAB — URINALYSIS, ROUTINE W REFLEX MICROSCOPIC
Bilirubin Urine: NEGATIVE
Glucose, UA: NEGATIVE mg/dL
Ketones, ur: NEGATIVE mg/dL
Nitrite: NEGATIVE
Protein, ur: 30 mg/dL — AB
RBC / HPF: >50 RBC/hpf (ref 0–5)
Specific Gravity, Urine: 1.008 (ref 1.005–1.030)
WBC, UA: >50 WBC/hpf (ref 0–5)
pH: 6 (ref 5.0–8.0)

## 2024-03-27 LAB — GLUCOSE, CAPILLARY
Glucose-Capillary: 90 mg/dL (ref 70–99)
Glucose-Capillary: 101 mg/dL — ABNORMAL HIGH (ref 70–99)
Glucose-Capillary: 105 mg/dL — ABNORMAL HIGH (ref 70–99)
Glucose-Capillary: 106 mg/dL — ABNORMAL HIGH (ref 70–99)

## 2024-03-27 LAB — CBC
HCT: 36.3 % (ref 36.0–46.0)
Hemoglobin: 11.1 g/dL — ABNORMAL LOW (ref 12.0–15.0)
MCH: 27.6 pg (ref 26.0–34.0)
MCHC: 30.6 g/dL (ref 30.0–36.0)
MCV: 90.3 fL (ref 80.0–100.0)
Platelets: 281 K/uL (ref 150–400)
RBC: 4.02 MIL/uL (ref 3.87–5.11)
RDW: 14.6 % (ref 11.5–15.5)
WBC: 6.1 K/uL (ref 4.0–10.5)
nRBC: 0 % (ref 0.0–0.2)

## 2024-03-27 LAB — MAGNESIUM: Magnesium: 1.7 mg/dL (ref 1.7–2.4)

## 2024-03-27 MED ORDER — MAGNESIUM SULFATE 2 GM/50ML IV SOLN
2.0000 g | Freq: Once | INTRAVENOUS | Status: AC
  Administered 2024-03-27: 2 g via INTRAVENOUS
  Filled 2024-03-27: qty 50

## 2024-03-27 NOTE — Progress Notes (Addendum)
 Physical Therapy Treatment Patient Details Name: [name] MRN: [id number] DOB: 06-03-1946 Today's Date: 03/27/2024   History of Present Illness Pt is a 78 y/o female admitted on 03/17/24 after presenting with c/o AMS with frequent falling. Pt is being treated for acute hypotension, multifactorial in origin. PMH: lung CA, recurrent C. Diff colitis, a-fib on eliquis , CVA, CKD 3A    PT Comments  Pt ambulated 28' x 2 with RW, distance limited by chronic B knee pain. 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 Pt reports she's had HHPT and knows exercises for knee strengthening which she performs independently. She is near baseline with mobility.      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 at baseline (not a TKA candidate 2* cardiologist) Restrictions Weight Bearing Restrictions Per Provider Order: No     Mobility  Bed Mobility Overal bed mobility: Modified Independent Bed Mobility: Supine to Sit     Supine to sit: HOB elevated, Used rails, Modified independent (Device/Increase time)     General bed mobility comments: increased time    Transfers Overall transfer level: Needs assistance Equipment used: Rolling walker (2 wheels) Transfers: Sit to/from Stand Sit to Stand: Contact guard assist           General transfer comment: increased time/effort 2* painful  knees, good hand placement    Ambulation/Gait Ambulation/Gait assistance: Contact guard assist Gait Distance (Feet): 28 Feet Assistive device: Rolling walker (2 wheels) Gait Pattern/deviations: Antalgic, Trunk flexed, Decreased stride length, Step-through pattern Gait velocity: decreased     General Gait Details: 28' x 2 with seated rest break, distance limited by B chronic knee pain (pt reports she only walks short distances with rollator at baseline), kyphotic spine/forward head   Stairs             Wheelchair Mobility     Tilt Bed    Modified Rankin (Stroke Patients Only)       Balance Overall balance assessment: Needs assistance Sitting-balance support: Feet supported Sitting balance-Leahy Scale: Fair     Standing balance support: Bilateral upper extremity supported, Reliant on assistive device for balance Standing balance-Leahy Scale: Poor Standing balance comment: heavy UE support                            Communication Communication Communication: No apparent difficulties  Cognition Arousal: Alert Behavior During Therapy: WFL for tasks assessed/performed   PT - Cognitive impairments: No apparent impairments                         Following commands: Intact      Cueing Cueing Techniques: Verbal cues  Exercises      General Comments        Pertinent Vitals/Pain Pain Assessment Pain Score: 7  Pain Location: knees with ambulation Pain Descriptors / Indicators: Aching, Sore, Discomfort Pain Intervention(s): Limited activity  within patient's tolerance, Monitored during session, Repositioned    Home Living                          Prior Function            PT Goals (current goals can now be found in the care plan section) Acute Rehab PT Goals Patient Stated Goal: get better PT Goal Formulation: With patient Time For Goal Achievement: 04/02/24 Potential to Achieve Goals: Good Progress towards PT goals:  Progressing toward goals    Frequency    Min 3X/week      PT Plan      Co-evaluation              AM-PAC PT 6 Clicks Mobility   Outcome Measure  Help needed turning from your back to your side while in a flat bed without using bedrails?: A Little Help needed moving from lying on your back to sitting on the side of a flat bed without using bedrails?: A Little Help needed moving to and from a bed to a chair (including a wheelchair)?: A Little Help needed standing up from a chair using your arms (e.g., wheelchair or bedside chair)?: A Little Help needed to walk in hospital room?: A Little Help needed climbing 3-5 steps with a railing? : A Lot 6 Click Score: 17    End of Session Equipment Utilized During Treatment: Gait belt Activity Tolerance: Patient limited by pain Patient left: in chair;with call bell/phone within reach;with chair alarm set;with family/visitor present Nurse Communication: Mobility status PT Visit Diagnosis: Difficulty in walking, not elsewhere classified (R26.2);Muscle weakness (generalized) (M62.81) Pain - Right/Left: Right (both) Pain - part of body: Knee     Time: 8848-8783 PT Time Calculation (min) (ACUTE ONLY): 25 min  Charges:    $Gait Training: 8-22 mins $Therapeutic Activity: 8-22 mins PT General Charges $$ ACUTE PT VISIT: 1 Visit                     Sylvan Delon Copp PT 03/27/2024  Acute Rehabilitation Services  Office [phone]

## 2024-03-27 NOTE — Progress Notes (Signed)
 PROGRESS NOTE    [name]  FMW:[id number] DOB: 07/09/1946 DOA: 03/17/2024 PCP: Janey Santos, MD   Brief Narrative: 78 year old with past medical history of persistent A-fib, on Eliquis , hypomagnesemia, hypothyroidism, who was brought to family for evaluation after patient was noted to have altered mental status.  Patient has had multiple days with diarrhea.  Patient had previously dark his stool, which resolved after she stopped taking iron supplements.  She does have a history of hematuria and she has had clots.  She had a cystoscopy as an outpatient and was noted to have cracks in her bladder per patient urology think that her hematuria is related to Eliquis .  She was having fevers at home.  Patient was noted to be hypotensive she required low-dose of norepinephrine , she was admitted by CCM.  Urology was consulted due to concern for left hydronephrosis.  Patient was admitted with hypotensive shock, acute cystitis and hematuria  Assessment & Plan:   Principal Problem:   Shock (HCC) Active Problems:   Hypokalemia   Acute cystitis   Persistent atrial fibrillation (HCC)   Anticoagulant long-term use   Acute encephalopathy   Hypomagnesemia   Hypothyroidism   Acute metabolic encephalopathy   Acute renal failure superimposed on stage 3a chronic kidney disease (HCC)   Hypophosphatemia   Acute blood loss anemia   Anemia of chronic disease   1-Hypotensive shock In the setting of volume depletion and probably UTI.  Patient required pressors and has been weaned off. -GI pathogen was negative.  C. difficile PCR noted to be positive for C. difficile antigen, C. difficile toxin negative, likely colonization of C. Difficile - Vancomycin   was discontinue and Flagyl  as well.  Patient was treated with cefazolin  and was transitioned to Duricef to complete course of antibiotics for 7 days Blood pressure has remained stable  2-Acute cystitis E. coli UTI UA concerning for UTI urine  culture grew 60,000 colonies of E. coli.  Sensitive to cephalosporin. Was treated with IV cefazolin  and transition to Duricef to completed 7 days course Hematuria reoccur after initiation of eliquis . Pyuria persist, but she denies Dysuria today. Plan to monitor.   Acute metabolic encephalopathy altered mental status in the setting of infection.  Back to baseline  Acute on chronic diarrhea: GI pathogen negative.  C. difficile antigen positive C. difficile toxin and PCR negative, likely conization. -Family  insisted on GI consultation and GI was consulted. GI think bowel habits likely secondary to irritable bowel functional in the setting of prior C. 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 Difficile  Hematuria/left hydronephrosis/solitary kidney Has had intermittent hematuria exacerbated by Eliquis  Undergo cystoscopy a year ago by Dr. Selma CT abdomen and pelvis and renal ultrasound showed left hydronephrosis not completely new but worse from prior going all the way down to the UVJ.  Urology to review imaging no evidence of acute obstruction.  Follow-up as an outpatient Patient was restarted on Eliquis  on 80/4 after discussion with urology.  She has now developed again hematuria Hold Eliquis , urology re-consulted. Plan to hold eliquis . She will follow up with cardiology for evaluation watchman procedure   AKI on CKD stage IIIA status post right nephrectomy remote Painted with AKI with a creatinine of 2.5 last creatinine 1.5 01/2024 Setting of hypotension Renal ultrasound and CT showed mild left hydronephrosis.  Urology consulted no further recommendation in-house  Renal Function improved with IV fluids  Hypomagnesemia, hypokalemia hypophosphatemia: Replaced  Non Anion gap metabolic acidosis in the setting of diarrhea improved with bicarb tablets now  discontinue  Chronic A-fib: Continue bisoprolol  Anticoagulation initially held due to hematuria and required 2 unit of packed red blood cell. Dr. Sebastian discussed with urology on 8/4 and Eliquis  was resumed Developed hematuria again, plan to hold Eliquis  for now  Acute blood loss anemia/anemia of chronic disease Send with a hemoglobin of 6, hypotension and hematuria He received Kcentra , received 2 units of packed red blood cell.  Hemoglobin has remained stable at 11  Hypothyroidism: Continue with Synthroid    Estimated body mass index is 23.56 kg/m as calculated from the following:   Height as of this encounter: 5' 6 (1.676 m).   Weight as of this encounter: 66.2 kg.   DVT prophylaxis: SCD Code Status: Full code Family Communication: Husband  at bedside Disposition Plan:  Status is: Inpatient Remains inpatient appropriate because: management of hematuria    Consultants:  Urology GI  Procedures:    Antimicrobials:    Subjective: She report urine is less dark, pinkish today. Denies dysuria today   Objective: Vitals:   03/26/24 2130 03/27/24 0500 03/27/24 0513 03/27/24 1407  BP: (!) 141/86  (!) 149/88 (!) 144/99  Pulse: (!) 59  62 79  Resp: 14  16 18   Temp: 99.5 F (37.5 C)  98.1 F (36.7 C) 98.7 F (37.1 C)  TempSrc: Oral  Oral Oral  SpO2: 95%  96% 93%  Weight:  66.2 kg    Height:        Intake/Output Summary (Last 24 hours) at 03/27/2024 1542 Last data filed at 03/27/2024 0900 Gross per 24 hour  Intake 1583.05 ml  Output 1200 ml  Net 383.05 ml   Filed Weights   03/25/24 0500 03/26/24 0500 03/27/24 0500  Weight: 69.8 kg 67.8 kg 66.2 kg    Examination:  General exam: NAD Respiratory system: CTA Cardiovascular system: S 1, S 2 RRR Gastrointestinal system: BS present, soft nt Central nervous system: alert Extremities: Symmetric 5 x 5 power.   Data Reviewed: I have personally reviewed following labs and imaging studies  CBC: Recent Labs  Lab  03/21/24 0427 03/22/24 0501 03/23/24 0329 03/24/24 0342 03/25/24 0955 03/26/24 0426 03/27/24 0410  WBC 6.0 5.3  --   --   --   --  6.1  NEUTROABS 4.0  --   --   --   --   --   --   HGB 11.0* 11.4* 11.2* 11.1* 11.7* 11.3* 11.1*  HCT 36.5 37.1 36.5 37.9 38.7 37.3 36.3  MCV 90.1 90.3  --   --   --   --  90.3  PLT 209 213  --   --   --   --  281   Basic Metabolic Panel: Recent Labs  Lab 03/21/24 0427 03/22/24 0501 03/23/24 0329 03/24/24 0342 03/25/24 0955 03/26/24 0426 03/27/24 1014  NA 140 139 141 140 141 136  --   K 3.8 3.2* 4.0 4.0 4.0 3.7  --   CL 113* 112* 112* 109 107 103  --   CO2 18* 21* 22 23 24 22   --   GLUCOSE 92 133* 85 100* 98 92  --   BUN 19 15 12 10 10 9   --   CREATININE 1.47* 1.16* 1.07* 0.97 1.00 1.06*  --   CALCIUM  9.4 9.0 9.1 9.1 9.3 8.7*  --   MG 1.7  1.9 1.6* 2.1  --  1.6* 1.7  PHOS 3.1  --  2.7  --   --  3.4  --    GFR: Estimated Creatinine Clearance: 40.9 mL/min (A) (by C-G formula based on SCr of 1.06 mg/dL (H)). Liver Function Tests: Recent Labs  Lab 03/21/24 0427 03/23/24 0329 03/26/24 0426 03/26/24 1234  AST  --   --   --  14*  ALT  --   --   --  8  ALKPHOS  --   --   --  57  BILITOT  --   --   --  0.5  PROT  --   --   --  5.5*  ALBUMIN 2.5* 2.4* 2.5* 2.5*   No results for input(s): LIPASE, AMYLASE in the last 168 hours. No results for input(s): AMMONIA in the last 168 hours. Coagulation Profile: No results for input(s): INR, PROTIME in the last 168 hours. Cardiac Enzymes: No results for input(s): CKTOTAL, CKMB, CKMBINDEX, TROPONINI in the last 168 hours. BNP (last 3 results) No results for input(s): PROBNP in the last 8760 hours. HbA1C: No results for input(s): HGBA1C in the last 72 hours. CBG: Recent Labs  Lab 03/26/24 1136 03/26/24 1702 03/26/24 2131 03/27/24 0806 03/27/24 1128  GLUCAP 107* 96 100* 90 101*   Lipid Profile: No results for input(s): CHOL, HDL, LDLCALC, TRIG, CHOLHDL,  LDLDIRECT in the last 72 hours. Thyroid  Function Tests: No results for input(s): TSH, T4TOTAL, FREET4, T3FREE, THYROIDAB in the last 72 hours. Anemia Panel: No results for input(s): VITAMINB12, FOLATE, FERRITIN, TIBC, IRON, RETICCTPCT in the last 72 hours. Sepsis Labs: No results for input(s): PROCALCITON, LATICACIDVEN in the last 168 hours.  Recent Results (from the past 240 hours)  Blood Culture (routine x 2)     Status: Abnormal   Collection Time: 03/17/24 10:06 PM   Specimen: BLOOD  Result Value Ref Range Status   Specimen Description   Final    BLOOD BLOOD RIGHT ARM Performed at [facility], [address] [place], [place] [address]    Special Requests   Final    BOTTLES DRAWN AEROBIC ONLY Blood Culture results may not be optimal due to an inadequate volume of blood received in culture bottles Performed at [facility], [address] [place], [place] [address]    Culture  Setup Time   Final    GRAM POSITIVE RODS AEROBIC BOTTLE ONLY CRITICAL RESULT CALLED TO, READ BACK BY AND VERIFIED WITH: PHARMD MELISSA J. [id number] AT 1106, ADC    Culture (A)  Final    BACILLUS SPECIES BACILLUS MEGATERIUM Standardized susceptibility testing for this organism is not available. Performed at [facility], [address] [place], [place] [address]    Report Status 03/24/2024 FINAL  Final  Resp panel by RT-PCR (RSV, Flu A&B, Covid) Anterior Nasal Swab     Status: None   Collection Time: 03/17/24 11:08 PM   Specimen: Anterior Nasal Swab  Result Value Ref Range Status   SARS Coronavirus 2 by RT PCR NEGATIVE NEGATIVE Final    Comment: (NOTE) SARS-CoV-2 target nucleic acids are NOT DETECTED.  The SARS-CoV-2 RNA is generally detectable in upper respiratory specimens during the acute phase of infection. The lowest concentration of SARS-CoV-2 viral copies this assay can detect is 138 copies/mL. A negative result does not  preclude SARS-Cov-2 infection and should not be used as the sole basis for treatment or other patient management decisions. A negative result  may occur with  improper specimen collection/handling, submission of specimen other than nasopharyngeal swab, presence of viral mutation(s) within the areas targeted by this assay, and inadequate number of viral copies(<138 copies/mL). A negative result must be combined with clinical observations, patient history, and epidemiological information. The expected result is Negative.  Fact Sheet for Patients:  BloggerCourse.com  Fact Sheet for Healthcare Providers:  SeriousBroker.it  This test is no t yet approved or cleared by the [place]  FDA and  has been authorized for detection and/or diagnosis of SARS-CoV-2 by FDA under an Emergency Use Authorization (EUA). This EUA will remain  in effect (meaning this test can be used) for the duration of the COVID-19 declaration under Section 564(b)(1) of the Act, 21 U.S.C.section 360bbb-3(b)(1), unless the authorization is terminated  or revoked sooner.       Influenza A by PCR NEGATIVE NEGATIVE Final   Influenza B by PCR NEGATIVE NEGATIVE Final    Comment: (NOTE) The Xpert Xpress SARS-CoV-2/FLU/RSV plus assay is intended as an aid in the diagnosis of influenza from Nasopharyngeal swab specimens and should not be used as a sole basis for treatment. Nasal washings and aspirates are unacceptable for Xpert Xpress SARS-CoV-2/FLU/RSV testing.  Fact Sheet for Patients: BloggerCourse.com  Fact Sheet for Healthcare Providers: SeriousBroker.it  This test is not yet approved or cleared by the [place]  FDA and has been authorized for detection and/or diagnosis of SARS-CoV-2 by FDA under an Emergency Use Authorization (EUA). This EUA will remain in effect (meaning this test can be used) for the  duration of the COVID-19 declaration under Section 564(b)(1) of the Act, 21 U.S.C. section 360bbb-3(b)(1), unless the authorization is terminated or revoked.     Resp Syncytial Virus by PCR NEGATIVE NEGATIVE Final    Comment: (NOTE) Fact Sheet for Patients: BloggerCourse.com  Fact Sheet for Healthcare Providers: SeriousBroker.it  This test is not yet approved or cleared by the [place]  FDA and has been authorized for detection and/or diagnosis of SARS-CoV-2 by FDA under an Emergency Use Authorization (EUA). This EUA will remain in effect (meaning this test can be used) for the duration of the COVID-19 declaration under Section 564(b)(1) of the Act, 21 U.S.C. section 360bbb-3(b)(1), unless the authorization is terminated or revoked.  Performed at [facility], [address] [place], [place] [address]   Blood Culture (routine x 2)     Status: None   Collection Time: 03/17/24 11:35 PM   Specimen: BLOOD LEFT HAND  Result Value Ref Range Status   Specimen Description   Final    BLOOD LEFT HAND Performed at [facility], [address] [place], [place] [address]    Special Requests   Final    BOTTLES DRAWN AEROBIC ONLY Blood Culture results may not be optimal due to an inadequate volume of blood received in culture bottles Performed at [facility], [address], [place], [place] [address]    Culture   Final    NO GROWTH 5 DAYS Performed at [facility], [address], [place], [place] [address]    Report Status 03/23/2024 FINAL  Final  Urine Culture (for pregnant, neutropenic or urologic patients or patients with an indwelling urinary catheter)     Status: Abnormal   Collection Time: 03/18/24 12:04 AM   Specimen: Urine, Clean Catch  Result Value Ref Range Status   Specimen Description   Final    URINE, CLEAN CATCH Performed at [facility], [address] [place], [place] [address]    Special Requests   Final    NONE Performed at [facility], [address] [place], [place] [address]    Culture 60,000 COLONIES/mL ESCHERICHIA COLI (A)  Final   Report Status 03/21/2024 FINAL  Final   Organism ID, Bacteria ESCHERICHIA COLI (A)  Final      Susceptibility   Escherichia coli - MIC*    AMPICILLIN >=32 RESISTANT Resistant     CEFAZOLIN  <=4 SENSITIVE Sensitive     CEFEPIME  <=0.12 SENSITIVE Sensitive     CEFTRIAXONE  <=0.25 SENSITIVE Sensitive     CIPROFLOXACIN  <=0.25 SENSITIVE Sensitive     GENTAMICIN 4 SENSITIVE Sensitive     IMIPENEM <=0.25 SENSITIVE Sensitive     NITROFURANTOIN 64 INTERMEDIATE Intermediate     TRIMETH/SULFA <=20 SENSITIVE Sensitive     AMPICILLIN/SULBACTAM 8 SENSITIVE Sensitive     PIP/TAZO <=4 SENSITIVE Sensitive ug/mL    * 60,000 COLONIES/mL ESCHERICHIA COLI  C Difficile Quick Screen w PCR reflex     Status: Abnormal   Collection Time: 03/18/24  1:44 AM   Specimen: STOOL  Result Value Ref Range Status   C Diff antigen POSITIVE (A) NEGATIVE Final   C Diff toxin NEGATIVE NEGATIVE Final   C Diff interpretation Results are indeterminate. See PCR results.  Final    Comment: Performed at [facility], [address] [place], [place] [address]  Gastrointestinal Panel by PCR , Stool     Status: None   Collection Time: 03/18/24  1:44 AM   Specimen: Stool  Result Value Ref Range Status   Campylobacter species NOT DETECTED NOT DETECTED Final   Plesimonas shigelloides NOT DETECTED NOT DETECTED Final   Salmonella species NOT DETECTED NOT DETECTED Final   Yersinia enterocolitica NOT DETECTED NOT DETECTED Final   Vibrio species NOT DETECTED NOT DETECTED Final   Vibrio cholerae NOT DETECTED NOT DETECTED Final   Enteroaggregative E coli (EAEC) NOT DETECTED NOT DETECTED Final   Enteropathogenic E coli (EPEC) NOT DETECTED NOT DETECTED Final   Enterotoxigenic E coli  (ETEC) NOT DETECTED NOT DETECTED Final   Shiga like toxin producing E coli (STEC) NOT DETECTED NOT DETECTED Final   Shigella/Enteroinvasive E coli (EIEC) NOT DETECTED NOT DETECTED Final   Cryptosporidium NOT DETECTED NOT DETECTED Final   Cyclospora cayetanensis NOT DETECTED NOT DETECTED Final   Entamoeba histolytica NOT DETECTED NOT DETECTED Final   Giardia lamblia NOT DETECTED NOT DETECTED Final   Adenovirus F40/41 NOT DETECTED NOT DETECTED Final   Astrovirus NOT DETECTED NOT DETECTED Final   Norovirus GI/GII NOT DETECTED NOT DETECTED Final   Rotavirus A NOT DETECTED NOT DETECTED Final   Sapovirus (I, II, IV, and V) NOT DETECTED NOT DETECTED Final    Comment: Performed at [facility], [address] [place], [place] [address]  C. Diff by PCR, Reflexed     Status: None   Collection Time: 03/18/24  1:44 AM  Result Value Ref Range Status   Toxigenic C. Difficile by PCR NEGATIVE NEGATIVE Final    Comment: Patient is colonized with non toxigenic C. difficile. May not need treatment unless significant symptoms are present. Performed at [facility], [address] [place], [place] [address]   MRSA Next Gen by PCR, Nasal     Status: None   Collection Time: 03/18/24  3:45 AM   Specimen: Nasal Mucosa; Nasal Swab  Result Value Ref Range Status   MRSA  by PCR Next Gen NOT DETECTED NOT DETECTED Final    Comment: (NOTE) The GeneXpert MRSA Assay (FDA approved for NASAL specimens only), is one component of a comprehensive MRSA colonization surveillance program. It is not intended to diagnose MRSA infection nor to guide or monitor treatment for MRSA infections. Test performance is not FDA approved in patients less than 81 years old. Performed at [facility], [address] [place], [place] [address]          Radiology Studies: No results found.      Scheduled Meds:  bisoprolol   2.5 mg Oral Daily   buPROPion   75 mg Oral q morning    Chlorhexidine  Gluconate Cloth  6 each Topical Daily   cholecalciferol   1,000 Units Oral Daily   colestipol   1 g Oral BID   FLUoxetine   30 mg Oral QHS   gabapentin   300 mg Oral BID   insulin  aspart  0-15 Units Subcutaneous TID WC   levothyroxine   88 mcg Oral Q0600   pantoprazole   40 mg Oral BID   psyllium  1 packet Oral Daily   saccharomyces boulardii  250 mg Oral BID   Continuous Infusions:     LOS: 9 days    Time spent: 35 minutes    Johnette Teigen A Jovi Alvizo, MD [facility]   If 7PM-7AM, please contact night-coverage [email or web address]  03/27/2024, 3:42 PM

## 2024-03-27 NOTE — Plan of Care (Signed)
 Problem: Education: Goal: Ability to describe self-care measures that may prevent or decrease complications (Diabetes Survival Skills Education) will improve 03/27/2024 0016 by Olene Edsel SAILOR, RN Outcome: Progressing 03/27/2024 0015 by Olene Edsel SAILOR, RN Outcome: Progressing Goal: Individualized Educational Video(s) 03/27/2024 0016 by Olene Edsel SAILOR, RN Outcome: Progressing 03/27/2024 0015 by Olene Edsel SAILOR, RN Outcome: Progressing   Problem: Coping: Goal: Ability to adjust to condition or change in health will improve 03/27/2024 0016 by Olene Edsel SAILOR, RN Outcome: Progressing 03/27/2024 0015 by Olene Edsel SAILOR, RN Outcome: Progressing   Problem: Fluid Volume: Goal: Ability to maintain a balanced intake and output will improve 03/27/2024 0016 by Olene Edsel SAILOR, RN Outcome: Progressing 03/27/2024 0015 by Olene Edsel SAILOR, RN Outcome: Progressing   Problem: Health Behavior/Discharge Planning: Goal: Ability to identify and utilize available resources and services will improve 03/27/2024 0016 by Olene Edsel SAILOR, RN Outcome: Progressing 03/27/2024 0015 by Olene Edsel SAILOR, RN Outcome: Progressing Goal: Ability to manage health-related needs will improve 03/27/2024 0016 by Olene Edsel SAILOR, RN Outcome: Progressing 03/27/2024 0015 by Olene Edsel SAILOR, RN Outcome: Progressing   Problem: Metabolic: Goal: Ability to maintain appropriate glucose levels will improve 03/27/2024 0016 by Olene Edsel SAILOR, RN Outcome: Progressing 03/27/2024 0015 by Olene Edsel SAILOR, RN Outcome: Progressing   Problem: Nutritional: Goal: Maintenance of adequate nutrition will improve 03/27/2024 0016 by Olene Edsel SAILOR, RN Outcome: Progressing 03/27/2024 0015 by Olene Edsel SAILOR, RN Outcome: Progressing Goal: Progress toward achieving an optimal weight will improve 03/27/2024 0016 by Olene Edsel SAILOR, RN Outcome: Progressing 03/27/2024 0015 by Olene Edsel SAILOR, RN Outcome:  Progressing   Problem: Skin Integrity: Goal: Risk for impaired skin integrity will decrease 03/27/2024 0016 by Olene Edsel SAILOR, RN Outcome: Progressing 03/27/2024 0015 by Olene Edsel SAILOR, RN Outcome: Progressing   Problem: Tissue Perfusion: Goal: Adequacy of tissue perfusion will improve 03/27/2024 0016 by Olene Edsel SAILOR, RN Outcome: Progressing 03/27/2024 0015 by Olene Edsel SAILOR, RN Outcome: Progressing   Problem: Education: Goal: Knowledge of General Education information will improve Description: Including pain rating scale, medication(s)/side effects and non-pharmacologic comfort measures 03/27/2024 0016 by Olene Edsel SAILOR, RN Outcome: Progressing 03/27/2024 0015 by Olene Edsel SAILOR, RN Outcome: Progressing   Problem: Health Behavior/Discharge Planning: Goal: Ability to manage health-related needs will improve 03/27/2024 0016 by Olene Edsel SAILOR, RN Outcome: Progressing 03/27/2024 0015 by Olene Edsel SAILOR, RN Outcome: Progressing   Problem: Clinical Measurements: Goal: Ability to maintain clinical measurements within normal limits will improve 03/27/2024 0016 by Olene Edsel SAILOR, RN Outcome: Progressing 03/27/2024 0015 by Olene Edsel SAILOR, RN Outcome: Progressing Goal: Will remain free from infection 03/27/2024 0016 by Olene Edsel SAILOR, RN Outcome: Progressing 03/27/2024 0015 by Olene Edsel SAILOR, RN Outcome: Progressing Goal: Diagnostic test results will improve 03/27/2024 0016 by Olene Edsel SAILOR, RN Outcome: Progressing 03/27/2024 0015 by Olene Edsel SAILOR, RN Outcome: Progressing Goal: Respiratory complications will improve 03/27/2024 0016 by Olene Edsel SAILOR, RN Outcome: Progressing 03/27/2024 0015 by Olene Edsel SAILOR, RN Outcome: Progressing Goal: Cardiovascular complication will be avoided 03/27/2024 0016 by Olene Edsel SAILOR, RN Outcome: Progressing 03/27/2024 0015 by Olene Edsel SAILOR, RN Outcome: Progressing   Problem: Activity: Goal: Risk  for activity intolerance will decrease 03/27/2024 0016 by Olene Edsel SAILOR, RN Outcome: Progressing 03/27/2024 0015 by Olene Edsel SAILOR, RN Outcome: Progressing   Problem: Nutrition: Goal: Adequate nutrition will be maintained 03/27/2024 0016 by Olene Edsel SAILOR, RN Outcome: Progressing 03/27/2024 0015 by Olene Edsel SAILOR, RN Outcome: Progressing  Problem: Coping: Goal: Level of anxiety will decrease 03/27/2024 0016 by Olene Edsel SAILOR, RN Outcome: Progressing 03/27/2024 0015 by Olene Edsel SAILOR, RN Outcome: Progressing   Problem: Elimination: Goal: Will not experience complications related to bowel motility 03/27/2024 0016 by Olene Edsel SAILOR, RN Outcome: Progressing 03/27/2024 0015 by Olene Edsel SAILOR, RN Outcome: Progressing Goal: Will not experience complications related to urinary retention 03/27/2024 0016 by Olene Edsel SAILOR, RN Outcome: Progressing 03/27/2024 0015 by Olene Edsel SAILOR, RN Outcome: Progressing   Problem: Pain Managment: Goal: General experience of comfort will improve and/or be controlled 03/27/2024 0016 by Olene Edsel SAILOR, RN Outcome: Progressing 03/27/2024 0015 by Olene Edsel SAILOR, RN Outcome: Progressing   Problem: Safety: Goal: Ability to remain free from injury will improve 03/27/2024 0016 by Olene Edsel SAILOR, RN Outcome: Progressing 03/27/2024 0015 by Olene Edsel SAILOR, RN Outcome: Progressing   Problem: Skin Integrity: Goal: Risk for impaired skin integrity will decrease 03/27/2024 0016 by Olene Edsel SAILOR, RN Outcome: Progressing 03/27/2024 0015 by Olene Edsel SAILOR, RN Outcome: Progressing

## 2024-03-28 DIAGNOSIS — D62 Acute posthemorrhagic anemia: Secondary | ICD-10-CM | POA: Diagnosis not present

## 2024-03-28 DIAGNOSIS — N1831 Chronic kidney disease, stage 3a: Secondary | ICD-10-CM | POA: Diagnosis not present

## 2024-03-28 DIAGNOSIS — N179 Acute kidney failure, unspecified: Secondary | ICD-10-CM | POA: Diagnosis not present

## 2024-03-28 DIAGNOSIS — K219 Gastro-esophageal reflux disease without esophagitis: Secondary | ICD-10-CM | POA: Diagnosis not present

## 2024-03-28 DIAGNOSIS — M6281 Muscle weakness (generalized): Secondary | ICD-10-CM | POA: Diagnosis not present

## 2024-03-28 DIAGNOSIS — R2681 Unsteadiness on feet: Secondary | ICD-10-CM | POA: Diagnosis not present

## 2024-03-28 DIAGNOSIS — Z87448 Personal history of other diseases of urinary system: Secondary | ICD-10-CM | POA: Diagnosis not present

## 2024-03-28 DIAGNOSIS — N390 Urinary tract infection, site not specified: Secondary | ICD-10-CM | POA: Diagnosis not present

## 2024-03-28 DIAGNOSIS — D638 Anemia in other chronic diseases classified elsewhere: Secondary | ICD-10-CM | POA: Diagnosis not present

## 2024-03-28 DIAGNOSIS — R579 Shock, unspecified: Secondary | ICD-10-CM | POA: Diagnosis not present

## 2024-03-28 DIAGNOSIS — J449 Chronic obstructive pulmonary disease, unspecified: Secondary | ICD-10-CM | POA: Diagnosis not present

## 2024-03-28 DIAGNOSIS — Z7189 Other specified counseling: Secondary | ICD-10-CM | POA: Diagnosis not present

## 2024-03-28 DIAGNOSIS — N12 Tubulo-interstitial nephritis, not specified as acute or chronic: Secondary | ICD-10-CM | POA: Diagnosis not present

## 2024-03-28 DIAGNOSIS — I129 Hypertensive chronic kidney disease with stage 1 through stage 4 chronic kidney disease, or unspecified chronic kidney disease: Secondary | ICD-10-CM | POA: Diagnosis not present

## 2024-03-28 DIAGNOSIS — M1990 Unspecified osteoarthritis, unspecified site: Secondary | ICD-10-CM | POA: Diagnosis not present

## 2024-03-28 DIAGNOSIS — I4819 Other persistent atrial fibrillation: Secondary | ICD-10-CM | POA: Diagnosis not present

## 2024-03-28 DIAGNOSIS — A0472 Enterocolitis due to Clostridium difficile, not specified as recurrent: Secondary | ICD-10-CM | POA: Diagnosis not present

## 2024-03-28 DIAGNOSIS — G9782 Other postprocedural complications and disorders of nervous system: Secondary | ICD-10-CM | POA: Diagnosis not present

## 2024-03-28 DIAGNOSIS — B9620 Unspecified Escherichia coli [E. coli] as the cause of diseases classified elsewhere: Secondary | ICD-10-CM | POA: Diagnosis not present

## 2024-03-28 DIAGNOSIS — E039 Hypothyroidism, unspecified: Secondary | ICD-10-CM | POA: Diagnosis not present

## 2024-03-28 LAB — GLUCOSE, CAPILLARY
Glucose-Capillary: 92 mg/dL (ref 70–99)
Glucose-Capillary: 85 mg/dL (ref 70–99)

## 2024-03-28 LAB — BASIC METABOLIC PANEL WITH GFR
Anion gap: 10 (ref 5–15)
BUN: 9 mg/dL (ref 8–23)
CO2: 25 mmol/L (ref 22–32)
Calcium: 9.5 mg/dL (ref 8.9–10.3)
Chloride: 104 mmol/L (ref 98–111)
Creatinine, Ser: 0.93 mg/dL (ref 0.44–1.00)
GFR, Estimated: >60 mL/min (ref >60)
Glucose, Bld: 103 mg/dL — ABNORMAL HIGH (ref 70–99)
Potassium: 4.1 mmol/L (ref 3.5–5.1)
Sodium: 139 mmol/L (ref 135–145)

## 2024-03-28 LAB — CBC
HCT: 40.4 % (ref 36.0–46.0)
Hemoglobin: 12.5 g/dL (ref 12.0–15.0)
MCH: 28 pg (ref 26.0–34.0)
MCHC: 30.9 g/dL (ref 30.0–36.0)
MCV: 90.6 fL (ref 80.0–100.0)
Platelets: 291 K/uL (ref 150–400)
RBC: 4.46 MIL/uL (ref 3.87–5.11)
RDW: 14.7 % (ref 11.5–15.5)
WBC: 7.7 K/uL (ref 4.0–10.5)
nRBC: 0 % (ref 0.0–0.2)

## 2024-03-28 LAB — MAGNESIUM: Magnesium: 1.9 mg/dL (ref 1.7–2.4)

## 2024-03-28 MED ORDER — FLUOXETINE HCL 10 MG PO CAPS: 30.0000 mg | ORAL_CAPSULE | Freq: Every day | ORAL | 0 refills | Status: DC

## 2024-03-28 MED ORDER — COLESTIPOL HCL 1 G PO TABS: 1.0000 g | ORAL_TABLET | Freq: Two times a day (BID) | ORAL | 0 refills | Status: DC

## 2024-03-28 MED ORDER — PSYLLIUM 95 % PO PACK: 1.0000 | PACK | Freq: Every day | ORAL | 0 refills | Status: DC

## 2024-03-28 NOTE — TOC Transition Note (Signed)
 Transition of Care [facility]) - Discharge Note   Patient Details  Name: [name] MRN: [id number] Date of Birth: 23-Oct-1945  Transition of Care [facility]) CM/SW Contact:  Tawni CHRISTELLA Eva, LCSW Phone Number: 03/28/2024, 9:43 AM   Clinical Narrative:    Pt to d/c to [facility] for SNF placement. Pt's room is 208P, RN to call report to [phone]. PTAR called no further Care management needs, P Care management sign off.   Final next level of care: Skilled Nursing Facility Barriers to Discharge: Barriers Resolved   Patient Goals and CMS Choice Patient states their goals for this hospitalization and ongoing recovery are:: SNF to get stonger          Discharge Placement                  Name of family member notified: Camora, Tremain (Spouse)  [phone] (Mobile) Patient and family notified of of transfer: 03/28/24  Discharge Plan and Services Additional resources added to the After Visit Summary for                                       Social Drivers of Health (SDOH) Interventions SDOH Screenings   Food Insecurity: No Food Insecurity (03/18/2024)  Housing: Low Risk  (03/18/2024)  Transportation Needs: No Transportation Needs (03/18/2024)  Utilities: Not At Risk (03/18/2024)  Depression (PHQ2-9): Low Risk  (02/28/2023)  Social Connections: Socially Integrated (03/18/2024)  Tobacco Use: Medium Risk (03/18/2024)     Readmission Risk Interventions    01/27/2024   10:41 AM 01/03/2024   12:11 PM 05/23/2023   10:20 AM  Readmission Risk Prevention Plan  Transportation Screening Complete Complete Complete  PCP or Specialist Appt within 5-7 Days Complete  Complete  PCP or Specialist Appt within 3-5 Days  Complete   Home Care Screening Complete  Complete  Medication Review (RN CM) Complete  Complete  HRI or Home Care Consult  Complete   Social Work Consult for Recovery Care Planning/Counseling  Complete   Palliative Care Screening  Complete   Medication  Review Oceanographer)  Complete

## 2024-03-28 NOTE — Plan of Care (Signed)

## 2024-03-28 NOTE — Progress Notes (Signed)
 RN called and gave report to Cheryll, Charity fundraiser at [facility].

## 2024-03-28 NOTE — Discharge Summary (Signed)
 Physician Discharge Summary   Patient: [name] MRN: [id number] DOB: 1946/03/08  Admit date:     03/17/2024  Discharge date: 03/28/24  Discharge Physician: Owen DELENA Lore   PCP: Avva, Ravisankar, MD   Recommendations at discharge:    Needs to follow up with cardiology for Watchman procedure.  Needs to follow up with urology for hematuria.   Discharge Diagnoses: Principal Problem:   Shock (HCC) Active Problems:   Hypokalemia   Acute cystitis   Persistent atrial fibrillation (HCC)   Anticoagulant long-term use   Acute encephalopathy   Hypomagnesemia   Hypothyroidism   Acute metabolic encephalopathy   Acute renal failure superimposed on stage 3a chronic kidney disease (HCC)   Hypophosphatemia   Acute blood loss anemia   Anemia of chronic disease  Resolved Problems:   * No resolved hospital problems. *  Hospital Course: 78 year old with past medical history of persistent A-fib, on Eliquis , hypomagnesemia, hypothyroidism, who was brought to family for evaluation after patient was noted to have altered mental status.  Patient has had multiple days with diarrhea.  Patient had previously dark his stool, which resolved after she stopped taking iron supplements.  She does have a history of hematuria and she has had clots.  She had a cystoscopy as an outpatient and was noted to have cracks in her bladder per patient urology think that her hematuria is related to Eliquis .  She was having fevers at home.  Patient was noted to be hypotensive she required low-dose of norepinephrine , she was admitted by CCM.  Urology was consulted due to concern for left hydronephrosis.   Patient was admitted with hypotensive shock, acute cystitis and hematuria   Assessment and Plan: 1-Hypotensive shock In the setting of volume depletion and probably UTI.  Patient required pressors and has been weaned off. -GI pathogen was negative.  C. difficile PCR noted to be positive for C. difficile  antigen, C. difficile toxin negative, likely colonization of C. Difficile - Vancomycin   was discontinue and Flagyl  as well.  Patient was treated with cefazolin  and was transitioned to Duricef to complete course of antibiotics for 7 days Blood pressure has remained stable   2-Acute cystitis E. coli UTI UA concerning for UTI urine culture grew 60,000 colonies of E. coli.  Sensitive to cephalosporin. Was treated with IV cefazolin  and transition to Duricef to completed 7 days course Hematuria reoccur after initiation of eliquis . Pyuria persist, but she denies Dysuria, no fever no leukocytosis. Hold on antibiotics, she completed 7 days course and is high risk for C diff.  Plan to monitor.   Acute metabolic encephalopathy altered mental status in the setting of infection.  Back to baseline   Acute on chronic diarrhea: GI pathogen negative.  C. difficile antigen positive C. difficile toxin and PCR negative, likely conization. -Family  insisted on GI consultation and GI was consulted. GI think bowel habits likely secondary to irritable bowel functional in the setting of prior C. 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 Difficile   Hematuria/left hydronephrosis/solitary kidney Has had intermittent hematuria exacerbated by Eliquis  Undergo cystoscopy a year ago by Dr. Selma CT abdomen and pelvis and renal ultrasound showed left hydronephrosis not completely new but worse from prior going all the way down to the UVJ.  Urology to review imaging no evidence of acute obstruction.  Follow-up as an  outpatient Patient was restarted on Eliquis  on 80/4 after discussion with urology.  She has now developed again hematuria Hold Eliquis , urology re-consulted. Plan to hold eliquis . She will follow up with cardiology for evaluation watchman procedure. She required 2 units PRBC  during this admission due to hematuria.     AKI on CKD stage IIIA status post right nephrectomy remote Painted with AKI with a creatinine of 2.5 last creatinine 1.5 01/2024 Setting of hypotension Renal ultrasound and CT showed mild left hydronephrosis.  Urology consulted no further recommendation in-house Renal Function improved with IV fluids   Hypomagnesemia, hypokalemia hypophosphatemia: Replaced   Non Anion gap metabolic acidosis in the setting of diarrhea improved with bicarb tablets now discontinue   Chronic A-fib: Continue bisoprolol  Anticoagulation initially held due to hematuria and required 2 unit of packed red blood cell. Dr. Sebastian discussed with urology on 8/4 and Eliquis  was resumed 8/6: Developed hematuria again, plan to hold Eliquis  for now. Follow up with cardiology for Watchman procedure. Patient required Blood transfusion during this admission.    Acute blood loss anemia/anemia of chronic disease Presents  with a hemoglobin of 6, hypotension and hematuria He received Kcentra , received 2 units of packed red blood cell.  Hemoglobin has remained stable at 11   Hypothyroidism: Continue with Synthroid      Estimated body mass index is 23.56 kg/m as calculated from the following:   Height as of this encounter: 5' 6 (1.676 m).   Weight as of this encounter: 66.2 kg.        Consultants: Cardiology Urology , CCM  Disposition: Skilled nursing facility Diet recommendation:  Discharge Diet Orders (From admission, onward)     Start     Ordered   03/28/24 0000  Diet - low sodium heart healthy        03/28/24 0913           Cardiac diet DISCHARGE MEDICATION: Allergies as of 03/28/2024       Reactions   Penicillins Rash        Medication List     STOP taking these medications    apixaban  5 MG Tabs tablet Commonly known as: ELIQUIS        TAKE these medications    acetaminophen  325 MG tablet Commonly known as: TYLENOL  Take 2 tablets (650 mg  total) by mouth every 8 (eight) hours. What changed: when to take this   albuterol  108 (90 Base) MCG/ACT inhaler Commonly known as: VENTOLIN  HFA Inhale 2 puffs into the lungs every 6 (six) hours as needed for wheezing or shortness of breath.   ascorbic acid 500 MG tablet Commonly known as: VITAMIN C Take 500 mg by mouth daily.   atorvastatin  10 MG tablet Commonly known as: LIPITOR Take 1 tablet (10 mg total) by mouth daily. What changed: when to take this   bisoprolol  5 MG tablet Commonly known as: ZEBETA  Take 2.5 mg by mouth daily.   buPROPion  75 MG tablet Commonly known as: WELLBUTRIN  Take 75 mg by mouth every morning.   chlorpheniramine-HYDROcodone  10-8 MG/5ML Commonly known as: TUSSIONEX Take 5 mLs by mouth every 12 (twelve) hours. What changed:  when to take this reasons to take this   colestipol  1 g tablet Commonly known as: COLESTID  Take 1 tablet (1 g total) by mouth 2 (two) times daily.   ferrous sulfate  325 (65 FE) MG tablet Take 1 tablet (325 mg total) by mouth daily with breakfast.   FLUoxetine  10 MG capsule Commonly known as: PROZAC   Take 3 capsules (30 mg total) by mouth at bedtime. What changed:  medication strength how much to take Another medication with the same name was removed. Continue taking this medication, and follow the directions you see here.   gabapentin  600 MG tablet Commonly known as: NEURONTIN  Take 600 mg by mouth 2 (two) times daily. Take 600 mg by mouth in the morning & at bedtime and an additional 600 mg once a day as needed for pain   guaiFENesin -dextromethorphan  100-10 MG/5ML syrup Commonly known as: ROBITUSSIN DM Take 5 mLs by mouth every 4 (four) hours while awake.   levothyroxine  88 MCG tablet Commonly known as: Synthroid  Take 1 tablet (88 mcg total) by mouth daily before breakfast.   loratadine  10 MG tablet Commonly known as: CLARITIN  Take 10 mg by mouth daily as needed for allergies or rhinitis.   methenamine 1 g  tablet Commonly known as: HIPREX Take 1 g by mouth 2 (two) times daily.   omeprazole  20 MG capsule Commonly known as: PRILOSEC  Take 1 capsule (20 mg total) by mouth 2 (two) times daily before a meal.   ondansetron  4 MG disintegrating tablet Commonly known as: ZOFRAN -ODT Take 1 tablet (4 mg total) by mouth every 8 (eight) hours as needed for nausea or vomiting. What changed: reasons to take this   psyllium 95 % Pack Commonly known as: HYDROCIL/METAMUCIL Take 1 packet by mouth daily.   saccharomyces boulardii 250 MG capsule Commonly known as: Florastor Take 1 capsule (250 mg total) by mouth 2 (two) times daily.   Vitamin D3 1000 units Caps Take 1,000 Units by mouth daily.        Discharge Exam: Filed Weights   03/26/24 0500 03/27/24 0500 03/28/24 0500  Weight: 67.8 kg 66.2 kg 66.8 kg   General; NAD  Condition at discharge: stable  The results of significant diagnostics from this hospitalization (including imaging, microbiology, ancillary and laboratory) are listed below for reference.   Imaging Studies: ECHOCARDIOGRAM COMPLETE Result Date: 03/18/2024    ECHOCARDIOGRAM REPORT   Patient Name:   SEJLA MARZANO Date of Exam: 03/18/2024 Medical Rec #:  [id number]       Height:       66.0 in Accession #:    [id number]      Weight:       145.3 lb Date of Birth:  June 03, 1946       BSA:          1.746 m Patient Age:    30 years        BP:           99/60 mmHg Patient Gender: F               HR:           67 bpm. Exam Location:  Inpatient Procedure: 2D Echo, Cardiac Doppler and Color Doppler (Both Spectral and Color            Flow Doppler were utilized during procedure). Indications:    Shock  History:        Patient has prior history of Echocardiogram examinations, most                 recent 02/10/2019. Abnormal ECG, Arrythmias:Atrial Fibrillation,                 Signs/Symptoms:Shortness of Breath and Dyspnea; Risk                 Factors:Hypertension, Dyslipidemia and Former Smoker.  Lung                 cancer.  Sonographer:    Ellouise Mose RDCS Referring Phys: [id number] JESSICA MARSHALL IMPRESSIONS  1. Left ventricular ejection fraction, by estimation, is 60 to 65%. The left ventricle has normal function. The left ventricle has no regional wall motion abnormalities. Left ventricular diastolic parameters are indeterminate.  2. Right ventricular systolic function is normal. The right ventricular size is mildly enlarged. There is moderately elevated pulmonary artery systolic pressure. The estimated right ventricular systolic pressure is 49.3 mmHg.  3. Left atrial size was severely dilated.  4. Right atrial size was severely dilated.  5. The mitral valve is normal in structure. Mild mitral valve regurgitation. No evidence of mitral stenosis.  6. Tricuspid valve regurgitation is mild to moderate.  7. The aortic valve is tricuspid. Aortic valve regurgitation is trivial. Aortic valve sclerosis is present, with no evidence of aortic valve stenosis.  8. The inferior vena cava is dilated in size with <50% respiratory variability, suggesting right atrial pressure of 15 mmHg. Comparison(s): Prior images reviewed side by side. Right ventricular and bi-atrial dilation from prior study report. Unable to load 2020 images. FINDINGS  Left Ventricle: Left ventricular ejection fraction, by estimation, is 60 to 65%. The left ventricle has normal function. The left ventricle has no regional wall motion abnormalities. The left ventricular internal cavity size was normal in size. There is  no left ventricular hypertrophy. Left ventricular diastolic parameters are indeterminate. Right Ventricle: The right ventricular size is mildly enlarged. No increase in right ventricular wall thickness. Right ventricular systolic function is normal. There is moderately elevated pulmonary artery systolic pressure. The tricuspid regurgitant velocity is 2.93 m/s, and with an assumed right atrial pressure of 15 mmHg, the estimated right  ventricular systolic pressure is 49.3 mmHg. Left Atrium: Left atrial size was severely dilated. Right Atrium: Right atrial size was severely dilated. Pericardium: There is no evidence of pericardial effusion. Mitral Valve: The mitral valve is normal in structure. Mild mitral valve regurgitation. No evidence of mitral valve stenosis. MV peak gradient, 7.0 mmHg. The mean mitral valve gradient is 3.0 mmHg. Tricuspid Valve: The tricuspid valve is normal in structure. Tricuspid valve regurgitation is mild to moderate. No evidence of tricuspid stenosis. Aortic Valve: The aortic valve is tricuspid. Aortic valve regurgitation is trivial. Aortic valve sclerosis is present, with no evidence of aortic valve stenosis. Pulmonic Valve: The pulmonic valve was normal in structure. Pulmonic valve regurgitation is mild. No evidence of pulmonic stenosis. Aorta: The aortic root and ascending aorta are structurally normal, with no evidence of dilitation. Venous: The inferior vena cava is dilated in size with less than 50% respiratory variability, suggesting right atrial pressure of 15 mmHg. IAS/Shunts: No atrial level shunt detected by color flow Doppler.  LEFT VENTRICLE PLAX 2D LVIDd:         3.60 cm LVIDs:         2.50 cm LV PW:         1.10 cm LV IVS:        1.20 cm LVOT diam:     2.40 cm LV SV:         57 LV SV Index:   32 LVOT Area:     4.52 cm  LV Volumes (MOD) LV vol d, MOD A2C: 56.8 ml LV vol d, MOD A4C: 38.2 ml LV vol s, MOD A2C: 14.8 ml LV vol s, MOD A4C: 11.2 ml LV SV MOD  A2C:     42.0 ml LV SV MOD A4C:     38.2 ml LV SV MOD BP:      35.5 ml RIGHT VENTRICLE            IVC RV S prime:     6.74 cm/s  IVC diam: 2.80 cm TAPSE (M-mode): 1.4 cm LEFT ATRIUM              Index        RIGHT ATRIUM           Index LA diam:        4.10 cm  2.35 cm/m   RA Area:     37.20 cm LA Vol (A2C):   87.5 ml  50.12 ml/m  RA Volume:   154.00 ml 88.21 ml/m LA Vol (A4C):   101.0 ml 57.85 ml/m LA Biplane Vol: 94.5 ml  54.13 ml/m  AORTIC VALVE              PULMONIC VALVE LVOT Vmax:   69.00 cm/s  PR End Diast Vel: 1.98 msec LVOT Vmean:  44.700 cm/s LVOT VTI:    0.125 m  AORTA Ao Root diam: 3.40 cm Ao Asc diam:  3.70 cm MITRAL VALVE                TRICUSPID VALVE MV Area (PHT): 4.15 cm     TR Peak grad:   34.3 mmHg MV Area VTI:   2.74 cm     TR Vmax:        293.00 cm/s MV Peak grad:  7.0 mmHg MV Mean grad:  3.0 mmHg     SHUNTS MV Vmax:       1.32 m/s     Systemic VTI:  0.12 m MV Vmean:      74.5 cm/s    Systemic Diam: 2.40 cm MV Decel Time: 183 msec MV E velocity: 124.00 cm/s Stanly Leavens MD Electronically signed by Stanly Leavens MD Signature Date/Time: 03/18/2024/11:03:50 AM    Final    US  RENAL Result Date: 03/18/2024 CLINICAL DATA:  Acute kidney injury EXAM: RENAL / URINARY TRACT ULTRASOUND COMPLETE COMPARISON:  CT 03/18/2024 FINDINGS: Right Kidney: Absent Left Kidney: Renal measurements: 13.9 x 6.1 x 4.7 cm = volume: 209 mL. Renal cortical thickness is preserved cortical echogenicity is within normal limits. Mild to moderate hydronephrosis is present with distension of the renal pelvis. No intrarenal masses or calcifications are seen. Bladder: Asymmetric bladder wall thickening is seen posterosuperiorly. An infiltrative mural mass is not excluded. Other: None. IMPRESSION: 1. Mild to moderate left hydronephrosis. 2. Asymmetric bladder wall thickening posterosuperiorly. An infiltrative mural mass is not excluded. Correlation with cystoscopy may be helpful for further evaluation. Electronically Signed   By: Dorethia Molt M.D.   On: 03/18/2024 02:35   CT ABDOMEN PELVIS WO CONTRAST Result Date: 03/18/2024 CLINICAL DATA:  Lower abdominal pain EXAM: CT ABDOMEN AND PELVIS WITHOUT CONTRAST TECHNIQUE: Multidetector CT imaging of the abdomen and pelvis was performed following the standard protocol without IV contrast. RADIATION DOSE REDUCTION: This exam was performed according to the departmental dose-optimization program which includes  automated exposure control, adjustment of the mA and/or kV according to patient size and/or use of iterative reconstruction technique. COMPARISON:  01/02/2024, 05/02/2011 FINDINGS: Lower chest: Mild cardiomegaly. Moderate hiatal hernia. No acute abnormality. 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 This mass has decreased in size since  remote prior examination 05/02/2011 and is indeterminate but likely benign, possibly the residua of treated disease the sequela prior trauma or inflammation. The liver is otherwise un Pancreas: Unremarkable Spleen: Unremarkable Adrenals/Urinary Tract: The adrenal glands are unremarkable. 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 Mild left hydronephrosis has developed to the level the bladder trigone, however, distal obstructing mass or calculus is not clearly identified. No intrarenal or ureteral calculi. Bladder demonstrates asymmetric wall thickening, better appreciated than on prior examination indent underlying infiltrative mural mass is not excluded. Stomach/Bowel: Stomach is within normal limits. Appendix is not clearly identified, however, no secondary signs of appendicitis within the right lower quadrant. No evidence of bowel wall thickening, distention, or inflammatory changes. Vascular/Lymphatic: Aortic atherosclerosis. No enlarged abdominal or pelvic lymph nodes. Reproductive: Prostate is unremarkable. Other: No abdominal wall hernia or abnormality. No abdominopelvic ascites. 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 L3-4 lumbar fusion with instrumentation and L3 posterior decompression has been performed. No acute bone abnormality. No lytic or blastic bone lesion. IMPRESSION: 1. Interval development of mild left hydronephrosis to the level of the bladder trigone. No distal  obstructing mass or calculus is identified. There is, however, asymmetric bladder wall thickening and this may result in obstruction at the ureterovesicular junction. Correlation with cystoscopy the presence of an underlying mass may be helpful for further evaluation. 2. Moderate hiatal hernia. 3. 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 4. Status post cholecystectomy. 5. Absent right nephrectomy. 6. Status post L3-4 fusion and posterior decompression. Aortic Atherosclerosis (ICD10-I70.0). Electronically Signed   By: Dorethia Molt M.D.   On: 03/18/2024 02:32   DG Chest 2 View Result Date: 03/17/2024 CLINICAL DATA:  Altered mental status. Increasing weakness, multiple falls, and confusion. Abdominal pain. EXAM: CHEST - 2 VIEW COMPARISON:  01/30/2024 FINDINGS: Mild cardiac enlargement. Volume loss in the right lung with right mid lung opacity. Opacity appears more dense than on the prior study. This may represent progressing area of atelectasis, pneumonia, or could indicate a mass lesion or postoperative change. See previous CT evaluation from 12/21/2023. Left lung is clear. No pleural effusion or pneumothorax. Mediastinal contours appear intact. Moderate-sized esophageal hiatal hernia behind the heart. Calcification of the aorta. Degenerative changes in the spine. IMPRESSION: 1. Cardiac enlargement. 2. 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 This appears mildly increased since prior chest radiograph. This was evaluated at previous CT 12/21/2023. Electronically Signed   By: Elsie Gravely M.D.   On: 03/17/2024 22:32    Microbiology: Results for orders placed or performed during the hospital encounter of 03/17/24  Blood Culture (routine x 2)     Status: Abnormal   Collection Time: 03/17/24 10:06 PM   Specimen: BLOOD  Result Value Ref  Range Status   Specimen Description   Final    BLOOD BLOOD RIGHT ARM Performed at [facility], [address], [place], [place] [address]    Special Requests   Final    BOTTLES DRAWN AEROBIC ONLY Blood Culture results may not be optimal due to an inadequate volume of blood received in culture bottles Performed at [facility], [address], [place], [place] [address]    Culture  Setup Time   Final    GRAM POSITIVE RODS AEROBIC BOTTLE ONLY CRITICAL RESULT CALLED TO,  READ BACK BY AND VERIFIED WITH: PHARMD MELISSA J. [id number] AT 1106, ADC    Culture (A)  Final    BACILLUS SPECIES BACILLUS MEGATERIUM Standardized susceptibility testing for this organism is not available. Performed at [facility], [address], [place], [place] [address]    Report Status 03/24/2024 FINAL  Final  Resp panel by RT-PCR (RSV, Flu A&B, Covid) Anterior Nasal Swab     Status: None   Collection Time: 03/17/24 11:08 PM   Specimen: Anterior Nasal Swab  Result Value Ref Range Status   SARS Coronavirus 2 by RT PCR NEGATIVE NEGATIVE Final    Comment: (NOTE) SARS-CoV-2 target nucleic acids are NOT DETECTED.  The SARS-CoV-2 RNA is generally detectable in upper respiratory specimens during the acute phase of infection. The lowest concentration of SARS-CoV-2 viral copies this assay can detect is 138 copies/mL. A negative result does not preclude SARS-Cov-2 infection and should not be used as the sole basis for treatment or other patient management decisions. A negative result may occur with  improper specimen collection/handling, submission of specimen other than nasopharyngeal swab, presence of viral mutation(s) within the areas targeted by this assay, and inadequate number of viral copies(<138 copies/mL). A negative result must be combined with clinical observations, patient history, and epidemiological information. The expected result is Negative.  Fact Sheet for  Patients:  BloggerCourse.com  Fact Sheet for Healthcare Providers:  SeriousBroker.it  This test is no t yet approved or cleared by the [place]  FDA and  has been authorized for detection and/or diagnosis of SARS-CoV-2 by FDA under an Emergency Use Authorization (EUA). This EUA will remain  in effect (meaning this test can be used) for the duration of the COVID-19 declaration under Section 564(b)(1) of the Act, 21 U.S.C.section 360bbb-3(b)(1), unless the authorization is terminated  or revoked sooner.       Influenza A by PCR NEGATIVE NEGATIVE Final   Influenza B by PCR NEGATIVE NEGATIVE Final    Comment: (NOTE) The Xpert Xpress SARS-CoV-2/FLU/RSV plus assay is intended as an aid in the diagnosis of influenza from Nasopharyngeal swab specimens and should not be used as a sole basis for treatment. Nasal washings and aspirates are unacceptable for Xpert Xpress SARS-CoV-2/FLU/RSV testing.  Fact Sheet for Patients: BloggerCourse.com  Fact Sheet for Healthcare Providers: SeriousBroker.it  This test is not yet approved or cleared by the [place]  FDA and has been authorized for detection and/or diagnosis of SARS-CoV-2 by FDA under an Emergency Use Authorization (EUA). This EUA will remain in effect (meaning this test can be used) for the duration of the COVID-19 declaration under Section 564(b)(1) of the Act, 21 U.S.C. section 360bbb-3(b)(1), unless the authorization is terminated or revoked.     Resp Syncytial Virus by PCR NEGATIVE NEGATIVE Final    Comment: (NOTE) Fact Sheet for Patients: BloggerCourse.com  Fact Sheet for Healthcare Providers: SeriousBroker.it  This test is not yet approved or cleared by the [place]  FDA and has been authorized for detection and/or diagnosis of SARS-CoV-2 by FDA under an Emergency  Use Authorization (EUA). This EUA will remain in effect (meaning this test can be used) for the duration of the COVID-19 declaration under Section 564(b)(1) of the Act, 21 U.S.C. section 360bbb-3(b)(1), unless the authorization is terminated or revoked.  Performed at [facility], [address], [place], [place] [address]   Blood Culture (routine x 2)     Status: None   Collection Time: 03/17/24 11:35 PM   Specimen:  BLOOD LEFT HAND  Result Value Ref Range Status   Specimen Description   Final    BLOOD LEFT HAND Performed at [facility], [address] [place], [place] [address]    Special Requests   Final    BOTTLES DRAWN AEROBIC ONLY Blood Culture results may not be optimal due to an inadequate volume of blood received in culture bottles Performed at [facility], [address], [place], [place] [address]    Culture   Final    NO GROWTH 5 DAYS Performed at [facility], [address] [address], [place], [place] [address]    Report Status 03/23/2024 FINAL  Final  Urine Culture (for pregnant, neutropenic or urologic patients or patients with an indwelling urinary catheter)     Status: Abnormal   Collection Time: 03/18/24 12:04 AM   Specimen: Urine, Clean Catch  Result Value Ref Range Status   Specimen Description   Final    URINE, CLEAN CATCH Performed at [facility], [address] [place], [place] [address]    Special Requests   Final    NONE Performed at [facility], [address] [place], [place] [address]    Culture 60,000 COLONIES/mL ESCHERICHIA COLI (A)  Final   Report Status 03/21/2024 FINAL  Final   Organism ID, Bacteria ESCHERICHIA COLI (A)  Final      Susceptibility   Escherichia coli - MIC*    AMPICILLIN >=32 RESISTANT Resistant     CEFAZOLIN  <=4 SENSITIVE Sensitive     CEFEPIME  <=0.12 SENSITIVE Sensitive     CEFTRIAXONE  <=0.25 SENSITIVE Sensitive      CIPROFLOXACIN  <=0.25 SENSITIVE Sensitive     GENTAMICIN 4 SENSITIVE Sensitive     IMIPENEM <=0.25 SENSITIVE Sensitive     NITROFURANTOIN 64 INTERMEDIATE Intermediate     TRIMETH/SULFA <=20 SENSITIVE Sensitive     AMPICILLIN/SULBACTAM 8 SENSITIVE Sensitive     PIP/TAZO <=4 SENSITIVE Sensitive ug/mL    * 60,000 COLONIES/mL ESCHERICHIA COLI  C Difficile Quick Screen w PCR reflex     Status: Abnormal   Collection Time: 03/18/24  1:44 AM   Specimen: STOOL  Result Value Ref Range Status   C Diff antigen POSITIVE (A) NEGATIVE Final   C Diff toxin NEGATIVE NEGATIVE Final   C Diff interpretation Results are indeterminate. See PCR results.  Final    Comment: Performed at [facility], [address], [place], [place] [address]  Gastrointestinal Panel by PCR , Stool     Status: None   Collection Time: 03/18/24  1:44 AM   Specimen: Stool  Result Value Ref Range Status   Campylobacter species NOT DETECTED NOT DETECTED Final   Plesimonas shigelloides NOT DETECTED NOT DETECTED Final   Salmonella species NOT DETECTED NOT DETECTED Final   Yersinia enterocolitica NOT DETECTED NOT DETECTED Final   Vibrio species NOT DETECTED NOT DETECTED Final   Vibrio cholerae NOT DETECTED NOT DETECTED Final   Enteroaggregative E coli (EAEC) NOT DETECTED NOT DETECTED Final   Enteropathogenic E coli (EPEC) NOT DETECTED NOT DETECTED Final   Enterotoxigenic E coli (ETEC) NOT DETECTED NOT DETECTED Final   Shiga like toxin producing E coli (STEC) NOT DETECTED NOT DETECTED Final   Shigella/Enteroinvasive E coli (EIEC) NOT DETECTED NOT DETECTED Final   Cryptosporidium NOT DETECTED NOT DETECTED Final   Cyclospora cayetanensis NOT DETECTED NOT DETECTED Final   Entamoeba histolytica NOT DETECTED NOT DETECTED Final   Giardia lamblia NOT  DETECTED NOT DETECTED Final   Adenovirus F40/41 NOT DETECTED NOT DETECTED Final   Astrovirus NOT DETECTED NOT DETECTED Final   Norovirus GI/GII NOT DETECTED NOT DETECTED  Final   Rotavirus A NOT DETECTED NOT DETECTED Final   Sapovirus (I, II, IV, and V) NOT DETECTED NOT DETECTED Final    Comment: Performed at [facility], [address], [place], [place] [address]  C. Diff by PCR, Reflexed     Status: None   Collection Time: 03/18/24  1:44 AM  Result Value Ref Range Status   Toxigenic C. Difficile by PCR NEGATIVE NEGATIVE Final    Comment: Patient is colonized with non toxigenic C. difficile. May not need treatment unless significant symptoms are present. Performed at [facility], [address] [place], [place] [address]   MRSA Next Gen by PCR, Nasal     Status: None   Collection Time: 03/18/24  3:45 AM   Specimen: Nasal Mucosa; Nasal Swab  Result Value Ref Range Status   MRSA by PCR Next Gen NOT DETECTED NOT DETECTED Final    Comment: (NOTE) The GeneXpert MRSA Assay (FDA approved for NASAL specimens only), is one component of a comprehensive MRSA colonization surveillance program. It is not intended to diagnose MRSA infection nor to guide or monitor treatment for MRSA infections. Test performance is not FDA approved in patients less than 63 years old. Performed at [facility], [address], [place], [place] [address]     Labs: CBC: Recent Labs  Lab 03/22/24 0501 03/23/24 0329 03/24/24 0342 03/25/24 0955 03/26/24 0426 03/27/24 0410  WBC 5.3  --   --   --   --  6.1  HGB 11.4* 11.2* 11.1* 11.7* 11.3* 11.1*  HCT 37.1 36.5 37.9 38.7 37.3 36.3  MCV 90.3  --   --   --   --  90.3  PLT 213  --   --   --   --  281   Basic Metabolic Panel: Recent Labs  Lab 03/22/24 0501 03/23/24 0329 03/24/24 0342 03/25/24 0955 03/26/24 0426 03/27/24 1014  NA 139 141 140 141 136  --   K 3.2* 4.0 4.0 4.0 3.7  --   CL 112* 112* 109 107 103  --   CO2 21* 22 23 24 22   --   GLUCOSE 133* 85 100* 98 92  --   BUN 15 12 10 10 9   --   CREATININE 1.16* 1.07* 0.97 1.00 1.06*  --   CALCIUM  9.0 9.1 9.1 9.3 8.7*  --   MG 1.9  1.6* 2.1  --  1.6* 1.7  PHOS  --  2.7  --   --  3.4  --    Liver Function Tests: Recent Labs  Lab 03/23/24 0329 03/26/24 0426 03/26/24 1234  AST  --   --  14*  ALT  --   --  8  ALKPHOS  --   --  57  BILITOT  --   --  0.5  PROT  --   --  5.5*  ALBUMIN 2.4* 2.5* 2.5*   CBG: Recent Labs  Lab 03/27/24 0806 03/27/24 1128 03/27/24 1653 03/27/24 2031 03/28/24 0744  GLUCAP 90 101* 105* 106* 92    Discharge time spent: greater than 30 minutes.  Signed: Owen DELENA Lore, MD [facility] 03/28/2024

## 2024-03-31 DIAGNOSIS — N1831 Chronic kidney disease, stage 3a: Secondary | ICD-10-CM | POA: Diagnosis not present

## 2024-03-31 DIAGNOSIS — J449 Chronic obstructive pulmonary disease, unspecified: Secondary | ICD-10-CM | POA: Diagnosis not present

## 2024-03-31 DIAGNOSIS — D638 Anemia in other chronic diseases classified elsewhere: Secondary | ICD-10-CM | POA: Diagnosis not present

## 2024-03-31 DIAGNOSIS — D62 Acute posthemorrhagic anemia: Secondary | ICD-10-CM | POA: Diagnosis not present

## 2024-03-31 DIAGNOSIS — M1990 Unspecified osteoarthritis, unspecified site: Secondary | ICD-10-CM | POA: Diagnosis not present

## 2024-03-31 DIAGNOSIS — R2681 Unsteadiness on feet: Secondary | ICD-10-CM | POA: Diagnosis not present

## 2024-03-31 DIAGNOSIS — R579 Shock, unspecified: Secondary | ICD-10-CM | POA: Diagnosis not present

## 2024-03-31 DIAGNOSIS — N179 Acute kidney failure, unspecified: Secondary | ICD-10-CM | POA: Diagnosis not present

## 2024-03-31 DIAGNOSIS — F411 Generalized anxiety disorder: Secondary | ICD-10-CM | POA: Diagnosis not present

## 2024-03-31 DIAGNOSIS — M6281 Muscle weakness (generalized): Secondary | ICD-10-CM | POA: Diagnosis not present

## 2024-03-31 DIAGNOSIS — C3490 Malignant neoplasm of unspecified part of unspecified bronchus or lung: Secondary | ICD-10-CM | POA: Diagnosis not present

## 2024-03-31 DIAGNOSIS — I4819 Other persistent atrial fibrillation: Secondary | ICD-10-CM | POA: Diagnosis not present

## 2024-03-31 DIAGNOSIS — E039 Hypothyroidism, unspecified: Secondary | ICD-10-CM | POA: Diagnosis not present

## 2024-03-31 DIAGNOSIS — I129 Hypertensive chronic kidney disease with stage 1 through stage 4 chronic kidney disease, or unspecified chronic kidney disease: Secondary | ICD-10-CM | POA: Diagnosis not present

## 2024-03-31 DIAGNOSIS — G9782 Other postprocedural complications and disorders of nervous system: Secondary | ICD-10-CM | POA: Diagnosis not present

## 2024-04-02 DIAGNOSIS — R579 Shock, unspecified: Secondary | ICD-10-CM | POA: Diagnosis not present

## 2024-04-02 DIAGNOSIS — G9782 Other postprocedural complications and disorders of nervous system: Secondary | ICD-10-CM | POA: Diagnosis not present

## 2024-04-02 DIAGNOSIS — I4819 Other persistent atrial fibrillation: Secondary | ICD-10-CM | POA: Diagnosis not present

## 2024-04-02 DIAGNOSIS — M1990 Unspecified osteoarthritis, unspecified site: Secondary | ICD-10-CM | POA: Diagnosis not present

## 2024-04-02 DIAGNOSIS — R2681 Unsteadiness on feet: Secondary | ICD-10-CM | POA: Diagnosis not present

## 2024-04-02 DIAGNOSIS — M6281 Muscle weakness (generalized): Secondary | ICD-10-CM | POA: Diagnosis not present

## 2024-04-07 DIAGNOSIS — M1990 Unspecified osteoarthritis, unspecified site: Secondary | ICD-10-CM | POA: Diagnosis not present

## 2024-04-07 DIAGNOSIS — I4819 Other persistent atrial fibrillation: Secondary | ICD-10-CM | POA: Diagnosis not present

## 2024-04-07 DIAGNOSIS — M6281 Muscle weakness (generalized): Secondary | ICD-10-CM | POA: Diagnosis not present

## 2024-04-07 DIAGNOSIS — R2681 Unsteadiness on feet: Secondary | ICD-10-CM | POA: Diagnosis not present

## 2024-04-07 DIAGNOSIS — G9782 Other postprocedural complications and disorders of nervous system: Secondary | ICD-10-CM | POA: Diagnosis not present

## 2024-04-07 DIAGNOSIS — R579 Shock, unspecified: Secondary | ICD-10-CM | POA: Diagnosis not present

## 2024-04-09 DIAGNOSIS — R579 Shock, unspecified: Secondary | ICD-10-CM | POA: Diagnosis not present

## 2024-04-09 DIAGNOSIS — B9620 Unspecified Escherichia coli [E. coli] as the cause of diseases classified elsewhere: Secondary | ICD-10-CM | POA: Diagnosis not present

## 2024-04-09 DIAGNOSIS — M1990 Unspecified osteoarthritis, unspecified site: Secondary | ICD-10-CM | POA: Diagnosis not present

## 2024-04-09 DIAGNOSIS — J449 Chronic obstructive pulmonary disease, unspecified: Secondary | ICD-10-CM | POA: Diagnosis not present

## 2024-04-09 DIAGNOSIS — I4819 Other persistent atrial fibrillation: Secondary | ICD-10-CM | POA: Diagnosis not present

## 2024-04-09 DIAGNOSIS — G9782 Other postprocedural complications and disorders of nervous system: Secondary | ICD-10-CM | POA: Diagnosis not present

## 2024-04-09 DIAGNOSIS — R2681 Unsteadiness on feet: Secondary | ICD-10-CM | POA: Diagnosis not present

## 2024-04-09 DIAGNOSIS — M6281 Muscle weakness (generalized): Secondary | ICD-10-CM | POA: Diagnosis not present

## 2024-04-09 DIAGNOSIS — N390 Urinary tract infection, site not specified: Secondary | ICD-10-CM | POA: Diagnosis not present

## 2024-04-09 DIAGNOSIS — Z7189 Other specified counseling: Secondary | ICD-10-CM | POA: Diagnosis not present

## 2024-04-09 DIAGNOSIS — N1831 Chronic kidney disease, stage 3a: Secondary | ICD-10-CM | POA: Diagnosis not present

## 2024-04-09 DIAGNOSIS — K219 Gastro-esophageal reflux disease without esophagitis: Secondary | ICD-10-CM | POA: Diagnosis not present

## 2024-04-11 ENCOUNTER — Ambulatory Visit: Admitting: Gastroenterology

## 2024-04-11 DIAGNOSIS — N1831 Chronic kidney disease, stage 3a: Secondary | ICD-10-CM | POA: Diagnosis not present

## 2024-04-11 DIAGNOSIS — J449 Chronic obstructive pulmonary disease, unspecified: Secondary | ICD-10-CM | POA: Diagnosis not present

## 2024-04-11 DIAGNOSIS — N12 Tubulo-interstitial nephritis, not specified as acute or chronic: Secondary | ICD-10-CM | POA: Diagnosis not present

## 2024-04-11 DIAGNOSIS — Z87448 Personal history of other diseases of urinary system: Secondary | ICD-10-CM | POA: Diagnosis not present

## 2024-04-11 DIAGNOSIS — I4819 Other persistent atrial fibrillation: Secondary | ICD-10-CM | POA: Diagnosis not present

## 2024-04-11 DIAGNOSIS — D62 Acute posthemorrhagic anemia: Secondary | ICD-10-CM | POA: Diagnosis not present

## 2024-04-11 DIAGNOSIS — A0472 Enterocolitis due to Clostridium difficile, not specified as recurrent: Secondary | ICD-10-CM | POA: Diagnosis not present

## 2024-04-11 NOTE — Progress Notes (Deleted)
 [name]

## 2024-04-14 DIAGNOSIS — D631 Anemia in chronic kidney disease: Secondary | ICD-10-CM | POA: Diagnosis not present

## 2024-04-14 DIAGNOSIS — I4821 Permanent atrial fibrillation: Secondary | ICD-10-CM | POA: Diagnosis not present

## 2024-04-14 DIAGNOSIS — I700 Atherosclerosis of aorta: Secondary | ICD-10-CM | POA: Diagnosis not present

## 2024-04-14 DIAGNOSIS — E039 Hypothyroidism, unspecified: Secondary | ICD-10-CM | POA: Diagnosis not present

## 2024-04-14 DIAGNOSIS — J449 Chronic obstructive pulmonary disease, unspecified: Secondary | ICD-10-CM | POA: Diagnosis not present

## 2024-04-14 DIAGNOSIS — E440 Moderate protein-calorie malnutrition: Secondary | ICD-10-CM | POA: Diagnosis not present

## 2024-04-14 DIAGNOSIS — I129 Hypertensive chronic kidney disease with stage 1 through stage 4 chronic kidney disease, or unspecified chronic kidney disease: Secondary | ICD-10-CM | POA: Diagnosis not present

## 2024-04-14 DIAGNOSIS — N1832 Chronic kidney disease, stage 3b: Secondary | ICD-10-CM | POA: Diagnosis not present
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
## 2024-04-23 DIAGNOSIS — N1330 Unspecified hydronephrosis: Secondary | ICD-10-CM | POA: Diagnosis not present

## 2024-04-23 DIAGNOSIS — I129 Hypertensive chronic kidney disease with stage 1 through stage 4 chronic kidney disease, or unspecified chronic kidney disease: Secondary | ICD-10-CM | POA: Diagnosis not present

## 2024-04-23 DIAGNOSIS — I4891 Unspecified atrial fibrillation: Secondary | ICD-10-CM | POA: Diagnosis not present

## 2024-04-23 DIAGNOSIS — K582 Mixed irritable bowel syndrome: Secondary | ICD-10-CM | POA: Diagnosis not present

## 2024-04-23 DIAGNOSIS — D649 Anemia, unspecified: Secondary | ICD-10-CM | POA: Diagnosis not present

## 2024-04-23 DIAGNOSIS — N390 Urinary tract infection, site not specified: Secondary | ICD-10-CM | POA: Diagnosis not present

## 2024-04-23 DIAGNOSIS — R310 Gross hematuria: Secondary | ICD-10-CM | POA: Diagnosis not present

## 2024-04-23 DIAGNOSIS — G9340 Encephalopathy, unspecified: Secondary | ICD-10-CM | POA: Diagnosis not present

## 2024-04-23 DIAGNOSIS — Z23 Encounter for immunization: Secondary | ICD-10-CM | POA: Diagnosis not present

## 2024-04-24 DIAGNOSIS — E440 Moderate protein-calorie malnutrition: Secondary | ICD-10-CM | POA: Diagnosis not present

## 2024-04-24 DIAGNOSIS — N390 Urinary tract infection, site not specified: Secondary | ICD-10-CM | POA: Diagnosis not present

## 2024-04-24 DIAGNOSIS — I4821 Permanent atrial fibrillation: Secondary | ICD-10-CM | POA: Diagnosis not present

## 2024-04-24 DIAGNOSIS — N1832 Chronic kidney disease, stage 3b: Secondary | ICD-10-CM | POA: Diagnosis not present

## 2024-04-24 DIAGNOSIS — I129 Hypertensive chronic kidney disease with stage 1 through stage 4 chronic kidney disease, or unspecified chronic kidney disease: Secondary | ICD-10-CM | POA: Diagnosis not present

## 2024-04-24 DIAGNOSIS — J449 Chronic obstructive pulmonary disease, unspecified: Secondary | ICD-10-CM | POA: Diagnosis not present

## 2024-04-24 DIAGNOSIS — D631 Anemia in chronic kidney disease: Secondary | ICD-10-CM | POA: Diagnosis not present

## 2024-04-24 DIAGNOSIS — E039 Hypothyroidism, unspecified: Secondary | ICD-10-CM | POA: Diagnosis not present

## 2024-04-24 DIAGNOSIS — I700 Atherosclerosis of aorta: Secondary | ICD-10-CM | POA: Diagnosis not present

## 2024-04-25 ENCOUNTER — Ambulatory Visit: Attending: Cardiology | Admitting: Cardiology

## 2024-04-25 ENCOUNTER — Encounter: Payer: Self-pay | Admitting: Cardiology

## 2024-04-25 VITALS — BP 90/66 | HR 85 | Ht 66.0 in | Wt 129.0 lb

## 2024-04-25 DIAGNOSIS — I5181 Takotsubo syndrome: Secondary | ICD-10-CM | POA: Diagnosis not present

## 2024-04-25 DIAGNOSIS — I4821 Permanent atrial fibrillation: Secondary | ICD-10-CM

## 2024-04-25 NOTE — Patient Instructions (Signed)
 Medication Instructions:  Continue same medications *If you need a refill on your cardiac medications before your next appointment, please call your pharmacy*  Lab Work: None ordered  Testing/Procedures: None ordered  Follow-Up: At [facility], you and your health needs are our priority.  As part of our continuing mission to provide you with exceptional heart care, our providers are all part of one team.  This team includes your primary Cardiologist (physician) and Advanced Practice Providers or APPs (Physician Assistants and Nurse Practitioners) who all work together to provide you with the care you need, when you need it.  Your next appointment:  6 months  Call in Nov to schedule March appointment     Provider:  Dr.Jordan   We recommend signing up for the patient portal called MyChart.  Sign up information is provided on this After Visit Summary.  MyChart is used to connect with patients for Virtual Visits (Telemedicine).  Patients are able to view lab/test results, encounter notes, upcoming appointments, etc.  Non-urgent messages can be sent to your provider as well.   To learn more about what you can do with MyChart, go to [email or web address].

## 2024-04-30 NOTE — Progress Notes (Unsigned)
 Electrophysiology Office Note:    Date:  05/01/2024   ID:  [name], [name] 25-Jan-1946, MRN [id number]  [facility] Cardiologist:  Peter Swaziland, MD  [facility] Electrophysiologist:  OLE ONEIDA HOLTS, MD   Referring MD: Janey Santos, MD   Chief Complaint: Atrial fibrillation  History of Present Illness:     [name] is a 78 year old woman who I am seeing today for an evaluation of atrial fibrillation.  Her son-in-law is Dr. Lucas with cardiothoracic surgery.  The patient has a history of an ocular stroke in 2014, atrial fibrillation, COPD, GERD, hypertension, hyperlipidemia and hypothyroidism.  She was admitted in July with septic shock.  She also had hematuria that was ongoing for at least 1 month.  Hemoglobin was down to 6 and she required blood transfusion.  The Eliquis  was stopped.  Today she is with her husband and daughter.  She confirms the above and tells me that she has blood on multiple occasions when trying to restart full-strength Eliquis .    Their past medical, social and family history was reviewed.   ROS:   Please see the history of present illness.    All other systems reviewed and are negative.  EKGs/Labs/Other Studies Reviewed:    The following studies were reviewed today:  March 18, 2024 echo EF 60% RV normal Dilated left and right atrium Mild MR Mild to moderate TR  February 10, 2019 left heart catheterization shows normal coronary arteries      Physical Exam:    VS:  BP 98/66   Pulse 83   Ht 5' 6 (1.676 m)   Wt 132 lb (59.9 kg)   SpO2 97%   BMI 21.31 kg/m     Wt Readings from Last 3 Encounters:  05/01/24 132 lb (59.9 kg)  04/25/24 129 lb (58.5 kg)  03/28/24 147 lb 4.3 oz (66.8 kg)     GEN: no distress.  Elderly CARD: RRR, No MRG RESP: No IWOB. CTAB.        ASSESSMENT AND PLAN:    1. Persistent atrial fibrillation   2. Severe anemia   3. Hematuria, unspecified type     #Atrial fibrillation #History of severe  hematuria requiring blood transfusion The patient has a history of atrial fibrillation previously treated with anticoagulation for stroke prophylaxis.  Unfortunately, she developed severe anemia secondary to hematuria while on anticoagulation.  Her anticoagulant was then stopped.  We had a long discussion today about options.  We discussed the Watchman procedure in detail including likelihood of success, need for perioperative anticoagulation, procedural risks.  We also discussed restarting full-strength anticoagulation.  We discussed not starting any anticoagulation and excepting heightened stroke risk.  Last, we discussed using a reduced dose anticoagulant off-label.  We discussed the limited data for such a strategy but the possibility that it would provide some stroke protection in a lower bleeding risk.  After our discussion, the patient and her family elected to proceed with the half dose Eliquis  strategy.  She will start Eliquis  2.5 mg by mouth twice daily after an upcoming family wedding.  We will check a CBC approximately 3 weeks after she restarts Eliquis .  If she tolerates the blood thinner, we will continue long-term with this strategy.  They are very understanding that this is a reduced dose and technically off label.  She will follow-up with me in December.      Signed, OLE ONEIDA. HOLTS, MD, [facility] 05/01/2024 11:53 AM    Electrophysiology [facility]

## 2024-05-01 ENCOUNTER — Other Ambulatory Visit (HOSPITAL_COMMUNITY): Payer: Self-pay

## 2024-05-01 ENCOUNTER — Other Ambulatory Visit: Payer: Self-pay

## 2024-05-01 ENCOUNTER — Ambulatory Visit: Attending: Cardiology | Admitting: Cardiology

## 2024-05-01 VITALS — BP 98/66 | HR 83 | Ht 66.0 in | Wt 132.0 lb

## 2024-05-01 DIAGNOSIS — R319 Hematuria, unspecified: Secondary | ICD-10-CM | POA: Diagnosis not present

## 2024-05-01 DIAGNOSIS — D649 Anemia, unspecified: Secondary | ICD-10-CM | POA: Diagnosis not present

## 2024-05-01 DIAGNOSIS — I4819 Other persistent atrial fibrillation: Secondary | ICD-10-CM | POA: Diagnosis not present

## 2024-05-01 MED ORDER — APIXABAN 2.5 MG PO TABS
2.5000 mg | ORAL_TABLET | Freq: Two times a day (BID) | ORAL | 11 refills | Status: DC
  Filled 2024-05-01: qty 60, 30d supply, fill #0

## 2024-05-01 NOTE — Patient Instructions (Signed)
 Medication Instructions:  Your physician has recommended you make the following change in your medication:  1) START taking Eliquis  2.5 mg twice daily  *If you need a refill on your cardiac medications before your next appointment, please call your pharmacy*  Lab Work: CBC - 3 weeks after starting Eliquis  - please go to the [facility] on the 1st floor. They are open from 8:00am - 4:30pm. No appointment is needed.  Follow-Up: At [facility], you and your health needs are our priority.  As part of our continuing mission to provide you with exceptional heart care, our providers are all part of one team.  This team includes your primary Cardiologist (physician) and Advanced Practice Providers or APPs (Physician Assistants and Nurse Practitioners) who all work together to provide you with the care you need, when you need it.  Your next appointment:   3 months  Provider:   Ole Holts, MD

## 2024-05-02 DIAGNOSIS — N390 Urinary tract infection, site not specified: Secondary | ICD-10-CM | POA: Diagnosis not present

## 2024-05-02 DIAGNOSIS — E039 Hypothyroidism, unspecified: Secondary | ICD-10-CM | POA: Diagnosis not present

## 2024-05-02 DIAGNOSIS — I4821 Permanent atrial fibrillation: Secondary | ICD-10-CM | POA: Diagnosis not present

## 2024-05-02 DIAGNOSIS — J449 Chronic obstructive pulmonary disease, unspecified: Secondary | ICD-10-CM | POA: Diagnosis not present

## 2024-05-02 DIAGNOSIS — I700 Atherosclerosis of aorta: Secondary | ICD-10-CM | POA: Diagnosis not present

## 2024-05-02 DIAGNOSIS — E440 Moderate protein-calorie malnutrition: Secondary | ICD-10-CM | POA: Diagnosis not present

## 2024-05-02 DIAGNOSIS — I129 Hypertensive chronic kidney disease with stage 1 through stage 4 chronic kidney disease, or unspecified chronic kidney disease: Secondary | ICD-10-CM | POA: Diagnosis not present

## 2024-05-02 DIAGNOSIS — D631 Anemia in chronic kidney disease: Secondary | ICD-10-CM | POA: Diagnosis not present

## 2024-05-14 DIAGNOSIS — I129 Hypertensive chronic kidney disease with stage 1 through stage 4 chronic kidney disease, or unspecified chronic kidney disease: Secondary | ICD-10-CM | POA: Diagnosis not present

## 2024-05-14 DIAGNOSIS — I4821 Permanent atrial fibrillation: Secondary | ICD-10-CM | POA: Diagnosis not present

## 2024-05-14 DIAGNOSIS — D631 Anemia in chronic kidney disease: Secondary | ICD-10-CM | POA: Diagnosis not present

## 2024-05-14 DIAGNOSIS — N1832 Chronic kidney disease, stage 3b: Secondary | ICD-10-CM | POA: Diagnosis not present

## 2024-05-14 DIAGNOSIS — I700 Atherosclerosis of aorta: Secondary | ICD-10-CM | POA: Diagnosis not present

## 2024-05-14 DIAGNOSIS — J449 Chronic obstructive pulmonary disease, unspecified: Secondary | ICD-10-CM | POA: Diagnosis not present

## 2024-05-14 DIAGNOSIS — E440 Moderate protein-calorie malnutrition: Secondary | ICD-10-CM | POA: Diagnosis not present

## 2024-05-14 DIAGNOSIS — E039 Hypothyroidism, unspecified: Secondary | ICD-10-CM | POA: Diagnosis not present

## 2024-05-14 DIAGNOSIS — N390 Urinary tract infection, site not specified: Secondary | ICD-10-CM | POA: Diagnosis not present

## 2024-05-15 DIAGNOSIS — N952 Postmenopausal atrophic vaginitis: Secondary | ICD-10-CM | POA: Diagnosis not present

## 2024-05-15 DIAGNOSIS — R350 Frequency of micturition: Secondary | ICD-10-CM | POA: Diagnosis not present

## 2024-05-15 DIAGNOSIS — N3021 Other chronic cystitis with hematuria: Secondary | ICD-10-CM | POA: Diagnosis not present

## 2024-05-15 DIAGNOSIS — N3946 Mixed incontinence: Secondary | ICD-10-CM | POA: Diagnosis not present

## 2024-05-15 DIAGNOSIS — R3915 Urgency of urination: Secondary | ICD-10-CM | POA: Diagnosis not present

## 2024-05-27 ENCOUNTER — Other Ambulatory Visit: Payer: Self-pay

## 2024-05-27 DIAGNOSIS — C3490 Malignant neoplasm of unspecified part of unspecified bronchus or lung: Secondary | ICD-10-CM | POA: Diagnosis not present

## 2024-05-27 DIAGNOSIS — N390 Urinary tract infection, site not specified: Secondary | ICD-10-CM | POA: Diagnosis not present

## 2024-05-27 DIAGNOSIS — I129 Hypertensive chronic kidney disease with stage 1 through stage 4 chronic kidney disease, or unspecified chronic kidney disease: Secondary | ICD-10-CM | POA: Diagnosis not present

## 2024-05-27 DIAGNOSIS — I4821 Permanent atrial fibrillation: Secondary | ICD-10-CM | POA: Diagnosis not present

## 2024-05-27 DIAGNOSIS — J449 Chronic obstructive pulmonary disease, unspecified: Secondary | ICD-10-CM | POA: Diagnosis not present

## 2024-05-27 DIAGNOSIS — R911 Solitary pulmonary nodule: Secondary | ICD-10-CM

## 2024-05-27 DIAGNOSIS — E440 Moderate protein-calorie malnutrition: Secondary | ICD-10-CM | POA: Diagnosis not present

## 2024-05-27 DIAGNOSIS — D631 Anemia in chronic kidney disease: Secondary | ICD-10-CM | POA: Diagnosis not present

## 2024-05-27 DIAGNOSIS — N1832 Chronic kidney disease, stage 3b: Secondary | ICD-10-CM | POA: Diagnosis not present

## 2024-05-28 ENCOUNTER — Inpatient Hospital Stay: Attending: Internal Medicine

## 2024-05-28 ENCOUNTER — Ambulatory Visit (HOSPITAL_COMMUNITY)
Admission: RE | Admit: 2024-05-28 | Discharge: 2024-05-28 | Disposition: A | Discharge status: Home / Self Care | Source: Ambulatory Visit | Attending: Hematology | Admitting: Hematology

## 2024-05-28 DIAGNOSIS — Z923 Personal history of irradiation: Secondary | ICD-10-CM | POA: Diagnosis not present

## 2024-05-28 DIAGNOSIS — C3490 Malignant neoplasm of unspecified part of unspecified bronchus or lung: Secondary | ICD-10-CM | POA: Diagnosis not present

## 2024-05-28 DIAGNOSIS — C342 Malignant neoplasm of middle lobe, bronchus or lung: Secondary | ICD-10-CM | POA: Insufficient documentation

## 2024-05-28 DIAGNOSIS — R918 Other nonspecific abnormal finding of lung field: Secondary | ICD-10-CM | POA: Diagnosis not present

## 2024-05-28 DIAGNOSIS — R911 Solitary pulmonary nodule: Secondary | ICD-10-CM

## 2024-05-28 DIAGNOSIS — Z85118 Personal history of other malignant neoplasm of bronchus and lung: Secondary | ICD-10-CM | POA: Diagnosis not present

## 2024-05-28 DIAGNOSIS — Z8701 Personal history of pneumonia (recurrent): Secondary | ICD-10-CM | POA: Diagnosis not present

## 2024-05-28 DIAGNOSIS — J90 Pleural effusion, not elsewhere classified: Secondary | ICD-10-CM | POA: Diagnosis not present

## 2024-05-28 LAB — CBC WITH DIFFERENTIAL (CANCER CENTER ONLY)
Abs Immature Granulocytes: 0.01 K/uL (ref 0.00–0.07)
Basophils Absolute: 0 K/uL (ref 0.0–0.1)
Basophils Relative: 1 %
Eosinophils Absolute: 0.1 K/uL (ref 0.0–0.5)
Eosinophils Relative: 2 %
HCT: 35.6 % — ABNORMAL LOW (ref 36.0–46.0)
Hemoglobin: 11.4 g/dL — ABNORMAL LOW (ref 12.0–15.0)
Immature Granulocytes: 0 %
Lymphocytes Relative: 27 %
Lymphs Abs: 1.3 K/uL (ref 0.7–4.0)
MCH: 29.3 pg (ref 26.0–34.0)
MCHC: 32 g/dL (ref 30.0–36.0)
MCV: 91.5 fL (ref 80.0–100.0)
Monocytes Absolute: 0.4 K/uL (ref 0.1–1.0)
Monocytes Relative: 9 %
Neutro Abs: 2.9 K/uL (ref 1.7–7.7)
Neutrophils Relative %: 61 %
Platelet Count: 210 K/uL (ref 150–400)
RBC: 3.89 MIL/uL (ref 3.87–5.11)
RDW: 16.4 % — ABNORMAL HIGH (ref 11.5–15.5)
WBC Count: 4.8 K/uL (ref 4.0–10.5)
nRBC: 0 % (ref 0.0–0.2)

## 2024-05-28 LAB — CMP (CANCER CENTER ONLY)
ALT: 7 U/L (ref 0–44)
AST: 10 U/L — ABNORMAL LOW (ref 15–41)
Albumin: 4 g/dL (ref 3.5–5.0)
Alkaline Phosphatase: 64 U/L (ref 38–126)
Anion gap: 5 (ref 5–15)
BUN: 16 mg/dL (ref 8–23)
CO2: 26 mmol/L (ref 22–32)
Calcium: 9.7 mg/dL (ref 8.9–10.3)
Chloride: 111 mmol/L (ref 98–111)
Creatinine: 1.36 mg/dL — ABNORMAL HIGH (ref 0.44–1.00)
GFR, Estimated: 40 mL/min — ABNORMAL LOW (ref >60)
Glucose, Bld: 109 mg/dL — ABNORMAL HIGH (ref 70–99)
Potassium: 3.9 mmol/L (ref 3.5–5.1)
Sodium: 142 mmol/L (ref 135–145)
Total Bilirubin: 0.6 mg/dL (ref 0.0–1.2)
Total Protein: 6.7 g/dL (ref 6.5–8.1)

## 2024-06-04 ENCOUNTER — Inpatient Hospital Stay: Admitting: Internal Medicine

## 2024-06-04 VITALS — BP 120/66 | HR 65 | Temp 97.9°F | Resp 17 | Ht 66.0 in | Wt 132.6 lb

## 2024-06-04 DIAGNOSIS — Z85118 Personal history of other malignant neoplasm of bronchus and lung: Secondary | ICD-10-CM | POA: Diagnosis not present

## 2024-06-04 DIAGNOSIS — C3490 Malignant neoplasm of unspecified part of unspecified bronchus or lung: Secondary | ICD-10-CM | POA: Diagnosis not present

## 2024-06-04 DIAGNOSIS — Z923 Personal history of irradiation: Secondary | ICD-10-CM | POA: Diagnosis not present

## 2024-06-04 DIAGNOSIS — Z8701 Personal history of pneumonia (recurrent): Secondary | ICD-10-CM | POA: Diagnosis not present

## 2024-06-04 DIAGNOSIS — R918 Other nonspecific abnormal finding of lung field: Secondary | ICD-10-CM | POA: Diagnosis not present
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
## 2024-06-18 ENCOUNTER — Encounter: Payer: Self-pay | Admitting: Physician Assistant

## 2024-06-18 ENCOUNTER — Ambulatory Visit (INDEPENDENT_AMBULATORY_CARE_PROVIDER_SITE_OTHER): Admitting: Physician Assistant

## 2024-06-18 VITALS — BP 110/66 | HR 50 | Ht 66.0 in | Wt 132.0 lb

## 2024-06-18 DIAGNOSIS — R110 Nausea: Secondary | ICD-10-CM | POA: Diagnosis not present

## 2024-06-18 DIAGNOSIS — D649 Anemia, unspecified: Secondary | ICD-10-CM | POA: Diagnosis not present

## 2024-06-18 DIAGNOSIS — K9089 Other intestinal malabsorption: Secondary | ICD-10-CM

## 2024-06-18 DIAGNOSIS — R319 Hematuria, unspecified: Secondary | ICD-10-CM | POA: Diagnosis not present

## 2024-06-18 DIAGNOSIS — K580 Irritable bowel syndrome with diarrhea: Secondary | ICD-10-CM

## 2024-06-18 DIAGNOSIS — I4819 Other persistent atrial fibrillation: Secondary | ICD-10-CM | POA: Diagnosis not present

## 2024-06-18 DIAGNOSIS — K529 Noninfective gastroenteritis and colitis, unspecified: Secondary | ICD-10-CM

## 2024-06-18 LAB — CBC
Hematocrit: 38.8 % (ref 34.0–46.6)
Hemoglobin: 12.4 g/dL (ref 11.1–15.9)
MCH: 30.2 pg (ref 26.6–33.0)
MCHC: 32 g/dL (ref 31.5–35.7)
MCV: 94 fL (ref 79–97)
Platelets: 300 x10E3/uL (ref 150–450)
RBC: 4.11 x10E6/uL (ref 3.77–5.28)
RDW: 13.5 % (ref 11.7–15.4)
WBC: 7.5 x10E3/uL (ref 3.4–10.8)

## 2024-06-18 MED ORDER — HYOSCYAMINE SULFATE 0.125 MG PO TABS: 0.1250 mg | ORAL_TABLET | Freq: Four times a day (QID) | ORAL | 3 refills | Status: AC | PRN

## 2024-06-18 MED ORDER — COLESTIPOL HCL 1 G PO TABS
1.0000 g | ORAL_TABLET | Freq: Two times a day (BID) | ORAL | 3 refills | Status: AC
Start: 2024-06-18 — End: 2025-06-13

## 2024-06-18 MED ORDER — ONDANSETRON 4 MG PO TBDP: 4.0000 mg | ORAL_TABLET | Freq: Three times a day (TID) | ORAL | 3 refills | Status: AC | PRN

## 2024-06-18 NOTE — Patient Instructions (Addendum)
 We have sent the following medications to your pharmacy for you to pick up at your convenience: Ondansetron  4 mg every 8 hours as needed, hyoscyamine  0.125 mg every 6 hours as needed and Colestid  1 gram twice daily  Please follow up sooner if symptoms increase or worsen  Due to recent changes in healthcare laws, you may see the results of your imaging and laboratory studies on MyChart before your provider has had a chance to review them.  We understand that in some cases there may be results that are confusing or concerning to you. Not all laboratory results come back in the same time frame and the provider may be waiting for multiple results in order to interpret others.  Please give us  48 hours in order for your provider to thoroughly review all the results before contacting the office for clarification of your results.   Thank you for trusting me with your gastrointestinal care!   Ellouise Console, PA-C _______________________________________________________  If your blood pressure at your visit was 140/90 or greater, please contact your primary care physician to follow up on this.  _______________________________________________________  If you are age 75 or older, your body mass index should be between 23-30. Your Body mass index is 21.31 kg/m. If this is out of the aforementioned range listed, please consider follow up with your Primary Care Provider.  If you are age 35 or younger, your body mass index should be between 19-25. Your Body mass index is 21.31 kg/m. If this is out of the aformentioned range listed, please consider follow up with your Primary Care Provider.   ________________________________________________________  The [facility] providers would like to encourage you to use MYCHART to communicate with providers for non-urgent requests or questions.  Due to long hold times on the telephone, sending your provider a message by [facility] may be a faster and more efficient way to get a  response.  Please allow 48 business hours for a response.  Please remember that this is for non-urgent requests.  _______________________________________________________
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
## 2024-06-23 ENCOUNTER — Encounter: Payer: Self-pay | Admitting: Radiology

## 2024-07-27 NOTE — Progress Notes (Unsigned)
  Electrophysiology Office Follow up Visit Note:    Date:  07/29/2024   ID:  [name], DOB 11/11/1945, MRN [id number]  PCP:  Janey Santos, MD  [facility] Cardiologist:  Peter Jordan, MD  [facility] Electrophysiologist:  OLE ONEIDA HOLTS, MD    Interval History:     [name] is a 78 y.o. female who presents for a follow up visit.   I last saw the patient May 01, 2024.  She has a history of persistent atrial fibrillation and anemia.  At the last appointment we discussed the Watchman procedure, reduced dose (off-label) anticoagulation.  After our discussion, the patient elected to proceed with half dose Eliquis .  We planned to check a CBC 3 weeks after restarting the Eliquis .  Repeat blood work after starting Eliquis  demonstrated a stable hemoglobin.  Unfortunately, shortly after that recheck she began bleeding and had to stop her blood thinner.  She remains off anticoagulant.        Past medical, surgical, social and family history were reviewed.  ROS:   Please see the history of present illness.    All other systems reviewed and are negative.  EKGs/Labs/Other Studies Reviewed:    The following studies were reviewed today:          Physical Exam:    VS:  BP 110/68   Pulse 68   Ht 5' 6 (1.676 m)   Wt 131 lb 12.8 oz (59.8 kg)   SpO2 96%   BMI 21.27 kg/m     Wt Readings from Last 3 Encounters:  07/29/24 131 lb 12.8 oz (59.8 kg)  06/18/24 132 lb (59.9 kg)  06/04/24 132 lb 9.6 oz (60.1 kg)     GEN: no distress CARD: RRR, No MRG RESP: No IWOB. CTAB.      ASSESSMENT:    1. Persistent atrial fibrillation   2. Severe anemia    PLAN:    In order of problems listed above:  #Atrial fibrillation #Severe anemia She had to unfortunately stop her anticoagulant again because her of her bleeding recurrence.  At this point, I would deem her not a candidate for any invasive procedures for her atrial fibrillation or anticoagulant.  She  understands that she is at an increased risk of stroke being off anticoagulation but the bigger risk is her recurrent bleeding while on anticoagulant.  I discussed my upcoming departure from [facility] during today's clinic appointment.  The patient will continue to follow-up with one of my EP partners moving forward.  Follow-up with EP as needed. Continue routine follow-up with general cardiology.    Signed, Ole Holts, MD, [facility], [facility] 07/29/2024 12:34 PM    Electrophysiology [facility]

## 2024-07-29 ENCOUNTER — Encounter: Payer: Self-pay | Admitting: Cardiology

## 2024-07-29 ENCOUNTER — Ambulatory Visit: Attending: Cardiology | Admitting: Cardiology

## 2024-07-29 VITALS — BP 110/68 | HR 68 | Ht 66.0 in | Wt 131.8 lb

## 2024-07-29 DIAGNOSIS — D649 Anemia, unspecified: Secondary | ICD-10-CM | POA: Diagnosis not present

## 2024-07-29 DIAGNOSIS — I4819 Other persistent atrial fibrillation: Secondary | ICD-10-CM

## 2024-07-31 ENCOUNTER — Ambulatory Visit: Admitting: Cardiology

## 2024-09-09 ENCOUNTER — Emergency Department (HOSPITAL_COMMUNITY)

## 2024-09-09 ENCOUNTER — Observation Stay (HOSPITAL_COMMUNITY)

## 2024-09-09 ENCOUNTER — Other Ambulatory Visit: Payer: Self-pay

## 2024-09-09 ENCOUNTER — Inpatient Hospital Stay (HOSPITAL_COMMUNITY)
Admission: EM | Admit: 2024-09-09 | Discharge: 2024-09-12 | DRG: 303 | Disposition: A | Discharge status: Home Health | Attending: Family Medicine | Admitting: Family Medicine

## 2024-09-09 ENCOUNTER — Encounter (HOSPITAL_COMMUNITY): Payer: Self-pay | Admitting: Family Medicine

## 2024-09-09 DIAGNOSIS — Z905 Acquired absence of kidney: Secondary | ICD-10-CM

## 2024-09-09 DIAGNOSIS — R29702 NIHSS score 2: Secondary | ICD-10-CM

## 2024-09-09 DIAGNOSIS — I493 Ventricular premature depolarization: Secondary | ICD-10-CM | POA: Diagnosis not present

## 2024-09-09 DIAGNOSIS — R200 Anesthesia of skin: Secondary | ICD-10-CM

## 2024-09-09 DIAGNOSIS — Z87891 Personal history of nicotine dependence: Secondary | ICD-10-CM

## 2024-09-09 DIAGNOSIS — I998 Other disorder of circulatory system: Secondary | ICD-10-CM | POA: Diagnosis not present

## 2024-09-09 DIAGNOSIS — K219 Gastro-esophageal reflux disease without esophagitis: Secondary | ICD-10-CM | POA: Diagnosis present

## 2024-09-09 DIAGNOSIS — Z8619 Personal history of other infectious and parasitic diseases: Secondary | ICD-10-CM

## 2024-09-09 DIAGNOSIS — I130 Hypertensive heart and chronic kidney disease with heart failure and stage 1 through stage 4 chronic kidney disease, or unspecified chronic kidney disease: Secondary | ICD-10-CM | POA: Diagnosis present

## 2024-09-09 DIAGNOSIS — N1832 Chronic kidney disease, stage 3b: Secondary | ICD-10-CM | POA: Diagnosis present

## 2024-09-09 DIAGNOSIS — I4819 Other persistent atrial fibrillation: Secondary | ICD-10-CM | POA: Diagnosis present

## 2024-09-09 DIAGNOSIS — F419 Anxiety disorder, unspecified: Secondary | ICD-10-CM | POA: Diagnosis present

## 2024-09-09 DIAGNOSIS — Z8719 Personal history of other diseases of the digestive system: Secondary | ICD-10-CM

## 2024-09-09 DIAGNOSIS — Z823 Family history of stroke: Secondary | ICD-10-CM

## 2024-09-09 DIAGNOSIS — I4891 Unspecified atrial fibrillation: Secondary | ICD-10-CM | POA: Diagnosis not present

## 2024-09-09 DIAGNOSIS — M545 Low back pain, unspecified: Secondary | ICD-10-CM | POA: Diagnosis present

## 2024-09-09 DIAGNOSIS — N3289 Other specified disorders of bladder: Secondary | ICD-10-CM | POA: Diagnosis present

## 2024-09-09 DIAGNOSIS — E7800 Pure hypercholesterolemia, unspecified: Secondary | ICD-10-CM | POA: Diagnosis present

## 2024-09-09 DIAGNOSIS — N1831 Chronic kidney disease, stage 3a: Secondary | ICD-10-CM | POA: Diagnosis present

## 2024-09-09 DIAGNOSIS — I639 Cerebral infarction, unspecified: Secondary | ICD-10-CM | POA: Diagnosis not present

## 2024-09-09 DIAGNOSIS — H53451 Other localized visual field defect, right eye: Secondary | ICD-10-CM | POA: Diagnosis present

## 2024-09-09 DIAGNOSIS — R911 Solitary pulmonary nodule: Secondary | ICD-10-CM | POA: Diagnosis not present

## 2024-09-09 DIAGNOSIS — C342 Malignant neoplasm of middle lobe, bronchus or lung: Secondary | ICD-10-CM

## 2024-09-09 DIAGNOSIS — N1330 Unspecified hydronephrosis: Secondary | ICD-10-CM | POA: Diagnosis present

## 2024-09-09 DIAGNOSIS — F32A Depression, unspecified: Secondary | ICD-10-CM | POA: Diagnosis present

## 2024-09-09 DIAGNOSIS — I472 Ventricular tachycardia, unspecified: Secondary | ICD-10-CM | POA: Diagnosis not present

## 2024-09-09 DIAGNOSIS — N136 Pyonephrosis: Secondary | ICD-10-CM | POA: Diagnosis present

## 2024-09-09 DIAGNOSIS — I4729 Other ventricular tachycardia: Secondary | ICD-10-CM

## 2024-09-09 DIAGNOSIS — Z7901 Long term (current) use of anticoagulants: Secondary | ICD-10-CM

## 2024-09-09 DIAGNOSIS — I513 Intracardiac thrombosis, not elsewhere classified: Principal | ICD-10-CM | POA: Diagnosis present

## 2024-09-09 DIAGNOSIS — N3000 Acute cystitis without hematuria: Secondary | ICD-10-CM | POA: Diagnosis present

## 2024-09-09 DIAGNOSIS — I5189 Other ill-defined heart diseases: Secondary | ICD-10-CM

## 2024-09-09 DIAGNOSIS — G8929 Other chronic pain: Secondary | ICD-10-CM | POA: Diagnosis present

## 2024-09-09 DIAGNOSIS — Z79899 Other long term (current) drug therapy: Secondary | ICD-10-CM

## 2024-09-09 DIAGNOSIS — Z79890 Hormone replacement therapy (postmenopausal): Secondary | ICD-10-CM

## 2024-09-09 DIAGNOSIS — I4510 Unspecified right bundle-branch block: Secondary | ICD-10-CM | POA: Diagnosis present

## 2024-09-09 DIAGNOSIS — E1122 Type 2 diabetes mellitus with diabetic chronic kidney disease: Secondary | ICD-10-CM | POA: Diagnosis present

## 2024-09-09 DIAGNOSIS — Z96641 Presence of right artificial hip joint: Secondary | ICD-10-CM | POA: Diagnosis present

## 2024-09-09 DIAGNOSIS — I722 Aneurysm of renal artery: Secondary | ICD-10-CM | POA: Diagnosis present

## 2024-09-09 DIAGNOSIS — Z888 Allergy status to other drugs, medicaments and biological substances status: Secondary | ICD-10-CM

## 2024-09-09 DIAGNOSIS — J449 Chronic obstructive pulmonary disease, unspecified: Secondary | ICD-10-CM | POA: Diagnosis present

## 2024-09-09 DIAGNOSIS — Z8701 Personal history of pneumonia (recurrent): Secondary | ICD-10-CM

## 2024-09-09 DIAGNOSIS — I5181 Takotsubo syndrome: Secondary | ICD-10-CM | POA: Diagnosis present

## 2024-09-09 DIAGNOSIS — I700 Atherosclerosis of aorta: Secondary | ICD-10-CM | POA: Diagnosis present

## 2024-09-09 DIAGNOSIS — I69398 Other sequelae of cerebral infarction: Secondary | ICD-10-CM

## 2024-09-09 DIAGNOSIS — Z86018 Personal history of other benign neoplasm: Secondary | ICD-10-CM

## 2024-09-09 DIAGNOSIS — M159 Polyosteoarthritis, unspecified: Secondary | ICD-10-CM | POA: Diagnosis present

## 2024-09-09 DIAGNOSIS — Z880 Allergy status to penicillin: Secondary | ICD-10-CM

## 2024-09-09 DIAGNOSIS — N3001 Acute cystitis with hematuria: Secondary | ICD-10-CM

## 2024-09-09 DIAGNOSIS — M810 Age-related osteoporosis without current pathological fracture: Secondary | ICD-10-CM | POA: Diagnosis present

## 2024-09-09 DIAGNOSIS — Q453 Other congenital malformations of pancreas and pancreatic duct: Secondary | ICD-10-CM

## 2024-09-09 DIAGNOSIS — E039 Hypothyroidism, unspecified: Secondary | ICD-10-CM | POA: Diagnosis present

## 2024-09-09 DIAGNOSIS — Z85118 Personal history of other malignant neoplasm of bronchus and lung: Secondary | ICD-10-CM

## 2024-09-09 DIAGNOSIS — I5032 Chronic diastolic (congestive) heart failure: Secondary | ICD-10-CM | POA: Diagnosis present

## 2024-09-09 DIAGNOSIS — C3490 Malignant neoplasm of unspecified part of unspecified bronchus or lung: Secondary | ICD-10-CM | POA: Diagnosis present

## 2024-09-09 DIAGNOSIS — Z8249 Family history of ischemic heart disease and other diseases of the circulatory system: Secondary | ICD-10-CM

## 2024-09-09 DIAGNOSIS — Z90710 Acquired absence of both cervix and uterus: Secondary | ICD-10-CM

## 2024-09-09 LAB — CBC
HCT: 37.6 % (ref 36.0–46.0)
Hemoglobin: 11.9 g/dL — ABNORMAL LOW (ref 12.0–15.0)
MCH: 30.6 pg (ref 26.0–34.0)
MCHC: 31.6 g/dL (ref 30.0–36.0)
MCV: 96.7 fL (ref 80.0–100.0)
Platelets: 252 K/uL (ref 150–400)
RBC: 3.89 MIL/uL (ref 3.87–5.11)
RDW: 13.9 % (ref 11.5–15.5)
WBC: 7.7 K/uL (ref 4.0–10.5)
nRBC: 0 % (ref 0.0–0.2)

## 2024-09-09 LAB — DIFFERENTIAL
Abs Immature Granulocytes: 0.04 K/uL (ref 0.00–0.07)
Basophils Absolute: 0 K/uL (ref 0.0–0.1)
Basophils Relative: 0 %
Eosinophils Absolute: 0 K/uL (ref 0.0–0.5)
Eosinophils Relative: 0 %
Immature Granulocytes: 1 %
Lymphocytes Relative: 15 %
Lymphs Abs: 1.1 K/uL (ref 0.7–4.0)
Monocytes Absolute: 0.4 K/uL (ref 0.1–1.0)
Monocytes Relative: 5 %
Neutro Abs: 6.1 K/uL (ref 1.7–7.7)
Neutrophils Relative %: 79 %

## 2024-09-09 LAB — I-STAT CHEM 8, ED
BUN: 24 mg/dL — ABNORMAL HIGH (ref 8–23)
Calcium, Ion: 1.25 mmol/L (ref 1.15–1.40)
Chloride: 104 mmol/L (ref 98–111)
Creatinine, Ser: 1.7 mg/dL — ABNORMAL HIGH (ref 0.44–1.00)
Glucose, Bld: 97 mg/dL (ref 70–99)
HCT: 29 % — ABNORMAL LOW (ref 36.0–46.0)
Hemoglobin: 9.9 g/dL — ABNORMAL LOW (ref 12.0–15.0)
Potassium: 4.3 mmol/L (ref 3.5–5.1)
Sodium: 139 mmol/L (ref 135–145)
TCO2: 22 mmol/L (ref 22–32)

## 2024-09-09 LAB — COMPREHENSIVE METABOLIC PANEL WITH GFR
ALT: 6 U/L (ref 0–44)
AST: 14 U/L — ABNORMAL LOW (ref 15–41)
Albumin: 3.9 g/dL (ref 3.5–5.0)
Alkaline Phosphatase: 81 U/L (ref 38–126)
Anion gap: 11 (ref 5–15)
BUN: 21 mg/dL (ref 8–23)
CO2: 21 mmol/L — ABNORMAL LOW (ref 22–32)
Calcium: 9.7 mg/dL (ref 8.9–10.3)
Chloride: 110 mmol/L (ref 98–111)
Creatinine, Ser: 1.56 mg/dL — ABNORMAL HIGH (ref 0.44–1.00)
GFR, Estimated: 33 mL/min — ABNORMAL LOW
Glucose, Bld: 116 mg/dL — ABNORMAL HIGH (ref 70–99)
Potassium: 4.2 mmol/L (ref 3.5–5.1)
Sodium: 143 mmol/L (ref 135–145)
Total Bilirubin: 0.3 mg/dL (ref 0.0–1.2)
Total Protein: 6.8 g/dL (ref 6.5–8.1)

## 2024-09-09 LAB — PROTIME-INR
INR: 1 (ref 0.8–1.2)
Prothrombin Time: 13.8 s (ref 11.4–15.2)

## 2024-09-09 LAB — APTT: aPTT: 30 s (ref 24–36)

## 2024-09-09 LAB — ETHANOL: Alcohol, Ethyl (B): <15 mg/dL

## 2024-09-09 LAB — TROPONIN T, HIGH SENSITIVITY
Troponin T High Sensitivity: 25 ng/L — ABNORMAL HIGH (ref 0–19)
Troponin T High Sensitivity: 22 ng/L — ABNORMAL HIGH (ref 0–19)

## 2024-09-09 MED ORDER — HEPARIN BOLUS VIA INFUSION
3000.0000 [IU] | Freq: Once | INTRAVENOUS | Status: AC
  Administered 2024-09-09: 3000 [IU] via INTRAVENOUS
  Filled 2024-09-09: qty 3000

## 2024-09-09 MED ORDER — LORAZEPAM 2 MG/ML IJ SOLN: 1.0000 mg | Freq: Once | INTRAMUSCULAR | Status: DC | PRN

## 2024-09-09 MED ORDER — GABAPENTIN 300 MG PO CAPS
600.0000 mg | ORAL_CAPSULE | Freq: Two times a day (BID) | ORAL | Status: DC
  Administered 2024-09-09 – 2024-09-12 (×6): 600 mg via ORAL
  Filled 2024-09-09 (×7): qty 2

## 2024-09-09 MED ORDER — PROCHLORPERAZINE EDISYLATE 10 MG/2ML IJ SOLN
5.0000 mg | Freq: Four times a day (QID) | INTRAMUSCULAR | Status: DC | PRN
  Administered 2024-09-11: 5 mg via INTRAVENOUS
  Filled 2024-09-09: qty 2

## 2024-09-09 MED ORDER — HEPARIN SODIUM (PORCINE) 5000 UNIT/ML IJ SOLN: 5000.0000 [IU] | Freq: Three times a day (TID) | INTRAMUSCULAR | Status: DC

## 2024-09-09 MED ORDER — ACETAMINOPHEN 160 MG/5ML PO SOLN: 650.0000 mg | ORAL | Status: DC | PRN

## 2024-09-09 MED ORDER — SENNOSIDES-DOCUSATE SODIUM 8.6-50 MG PO TABS: 1.0000 | ORAL_TABLET | Freq: Every evening | ORAL | Status: DC | PRN

## 2024-09-09 MED ORDER — BISOPROLOL FUMARATE 5 MG PO TABS
2.5000 mg | ORAL_TABLET | Freq: Every day | ORAL | Status: DC
  Administered 2024-09-10 – 2024-09-12 (×3): 2.5 mg via ORAL
  Filled 2024-09-09 (×3): qty 0.5

## 2024-09-09 MED ORDER — LEVOTHYROXINE SODIUM 88 MCG PO TABS
88.0000 ug | ORAL_TABLET | Freq: Every day | ORAL | Status: DC
  Administered 2024-09-10 – 2024-09-12 (×3): 88 ug via ORAL
  Filled 2024-09-09 (×3): qty 1

## 2024-09-09 MED ORDER — OXYCODONE HCL 5 MG PO TABS
2.5000 mg | ORAL_TABLET | ORAL | Status: DC | PRN
  Administered 2024-09-09 – 2024-09-12 (×6): 5 mg via ORAL
  Filled 2024-09-09 (×7): qty 1

## 2024-09-09 MED ORDER — FLUOXETINE HCL 20 MG PO CAPS
20.0000 mg | ORAL_CAPSULE | Freq: Every day | ORAL | Status: DC
  Administered 2024-09-09 – 2024-09-11 (×3): 20 mg via ORAL
  Filled 2024-09-09 (×3): qty 1

## 2024-09-09 MED ORDER — STROKE: EARLY STAGES OF RECOVERY BOOK
Freq: Once | Status: AC
  Filled 2024-09-09: qty 1

## 2024-09-09 MED ORDER — IOHEXOL 350 MG/ML SOLN
150.0000 mL | Freq: Once | INTRAVENOUS | Status: AC | PRN
  Administered 2024-09-09: 125 mL via INTRAVENOUS

## 2024-09-09 MED ORDER — FENTANYL CITRATE (PF) 50 MCG/ML IJ SOSY
25.0000 ug | PREFILLED_SYRINGE | INTRAMUSCULAR | Status: DC | PRN
  Administered 2024-09-10: 50 ug via INTRAVENOUS
  Administered 2024-09-10: 25 ug via INTRAVENOUS
  Administered 2024-09-10: 50 ug via INTRAVENOUS
  Filled 2024-09-09 (×3): qty 1

## 2024-09-09 MED ORDER — HEPARIN (PORCINE) 25000 UT/250ML-% IV SOLN
1050.0000 [IU]/h | INTRAVENOUS | Status: DC
  Administered 2024-09-09: 950 [IU]/h via INTRAVENOUS
  Administered 2024-09-10 – 2024-09-12 (×2): 1050 [IU]/h via INTRAVENOUS
  Filled 2024-09-09 (×3): qty 250

## 2024-09-09 MED ORDER — ACETAMINOPHEN 325 MG PO TABS: 650.0000 mg | ORAL_TABLET | ORAL | Status: DC | PRN

## 2024-09-09 MED ORDER — ALBUTEROL SULFATE (2.5 MG/3ML) 0.083% IN NEBU: 2.5000 mg | INHALATION_SOLUTION | Freq: Four times a day (QID) | RESPIRATORY_TRACT | Status: DC | PRN

## 2024-09-09 MED ORDER — ACETAMINOPHEN 650 MG RE SUPP: 650.0000 mg | RECTAL | Status: DC | PRN

## 2024-09-09 MED ORDER — SODIUM CHLORIDE 0.9 % IV SOLN: INTRAVENOUS | Status: DC

## 2024-09-09 MED ORDER — ALBUTEROL SULFATE HFA 108 (90 BASE) MCG/ACT IN AERS: 2.0000 | INHALATION_SPRAY | Freq: Four times a day (QID) | RESPIRATORY_TRACT | Status: DC | PRN

## 2024-09-09 MED ORDER — ATORVASTATIN CALCIUM 10 MG PO TABS
10.0000 mg | ORAL_TABLET | Freq: Every day | ORAL | Status: DC
  Administered 2024-09-10 – 2024-09-11 (×2): 10 mg via ORAL
  Filled 2024-09-09 (×2): qty 1

## 2024-09-09 MED ORDER — BUPROPION HCL 75 MG PO TABS
75.0000 mg | ORAL_TABLET | Freq: Every morning | ORAL | Status: DC
  Administered 2024-09-10 – 2024-09-12 (×3): 75 mg via ORAL
  Filled 2024-09-09 (×3): qty 1

## 2024-09-09 MED ORDER — PANTOPRAZOLE SODIUM 40 MG PO TBEC
40.0000 mg | DELAYED_RELEASE_TABLET | Freq: Two times a day (BID) | ORAL | Status: DC
  Administered 2024-09-09 – 2024-09-12 (×6): 40 mg via ORAL
  Filled 2024-09-09 (×6): qty 1

## 2024-09-09 MED ORDER — SODIUM CHLORIDE 0.9 % IV SOLN
1.0000 g | INTRAVENOUS | Status: DC
  Administered 2024-09-09 – 2024-09-11 (×3): 1 g via INTRAVENOUS
  Filled 2024-09-09 (×3): qty 10

## 2024-09-09 NOTE — ED Provider Notes (Signed)
 " [place] EMERGENCY DEPARTMENT AT [facility] Provider Note   CSN: [id number] Arrival date & time: 09/09/24  1433     Patient presents with: Numbness   [name] is a 79 y.o. female.   HPI Patient reports that she was in her home walking with her rollator when her left hand fairly suddenly started hurting a lot.  She reports she had very intense pain in the hand and the forearm.  She reports after that it started to feel numb as well and then it was coldness.  She reports she tried to move it and rested and got a little bit of relief but the pain kept coming and it kept feeling more numb.  She denies that she got dysfunction of the hand.  She denies that it was specifically weak or dysfunctional.  She did not have any other associated symptoms of weakness numbness visual disturbance or balance disturbance.  Patient has a history of atrial fibrillation but was taken off Eliquis  in October due to problems with recurrent bleeding from her bladder.  She reports that her doctor had examined the bladder and there were some cracks.  Apparently it was bleeding enough that she got pretty anemic at 1 point and after a trial or 2 of Eliquis  they ultimately stopped it.  She reports there was discussion of placing a watchman but that did not occur.  Patient's daughter adds that the patient is not aware when she is in atrial fibrillation or is not.  Predominantly she is asymptomatic.  Patient reports she is felt well recently.  No problems with headache chest pain.    Prior to Admission medications  Medication Sig Start Date End Date Taking? Authorizing Provider  acetaminophen  (TYLENOL ) 325 MG tablet Take 2 tablets (650 mg total) by mouth every 8 (eight) hours. Patient taking differently: Take 650 mg by mouth 2 (two) times daily. 02/18/19   Love, Sharlet RAMAN, PA-C  albuterol  (VENTOLIN  HFA) 108 (90 Base) MCG/ACT inhaler Inhale 2 puffs into the lungs every 6 (six) hours as needed for wheezing  or shortness of breath. 02/01/24   Pokhrel, Laxman, MD  apixaban  (ELIQUIS ) 2.5 MG TABS tablet Take 1 tablet (2.5 mg total) by mouth 2 (two) times daily. 05/01/24   Cindie Ole DASEN, MD  ascorbic acid (VITAMIN C) 500 MG tablet Take 500 mg by mouth daily.    [provider]  atorvastatin  (LIPITOR) 10 MG tablet Take 1 tablet (10 mg total) by mouth daily. 02/18/19   Love, Sharlet RAMAN, PA-C  bisoprolol  (ZEBETA ) 5 MG tablet Take 2.5 mg by mouth daily. 03/11/23   [provider]  buPROPion  (WELLBUTRIN ) 75 MG tablet Take 75 mg by mouth every morning. 12/31/20   [provider]  chlorpheniramine-HYDROcodone  (TUSSIONEX) 10-8 MG/5ML Take 5 mLs by mouth every 12 (twelve) hours. Patient taking differently: Take 5 mLs by mouth at bedtime as needed for cough. 02/01/24   Pokhrel, Vernal, MD  Cholecalciferol  (VITAMIN D3) 1000 units CAPS Take 1,000 Units by mouth daily.    [provider]  colestipol  (COLESTID ) 1 g tablet Take 1 tablet (1 g total) by mouth 2 (two) times daily. 06/18/24 06/13/25  [place], PA-C  ferrous sulfate  325 (65 FE) MG tablet Take 1 tablet (325 mg total) by mouth daily with breakfast. 01/09/24 07/29/24  Dahal, Chapman, MD  FLUoxetine  (PROZAC ) 10 MG capsule Take 3 capsules (30 mg total) by mouth at bedtime. 03/28/24   Regalado, Owen LABOR, MD  gabapentin  (  NEURONTIN ) 600 MG tablet Take 600 mg by mouth 2 (two) times daily. Take 600 mg by mouth in the morning & at bedtime and an additional 600 mg once a day as needed for pain    [provider]  guaiFENesin -dextromethorphan  (ROBITUSSIN DM) 100-10 MG/5ML syrup Take 5 mLs by mouth every 4 (four) hours while awake. 02/01/24   Pokhrel, Laxman, MD  hyoscyamine  (LEVSIN ) 0.125 MG tablet Take 1 tablet (0.125 mg total) by mouth every 6 (six) hours as needed for cramping. 06/18/24 06/13/25  [place], PA-C  levothyroxine  (SYNTHROID ) 88 MCG tablet Take 1 tablet (88 mcg total) by mouth daily before breakfast. 03/13/19    Jordan, Peter M, MD  loratadine  (CLARITIN ) 10 MG tablet Take 10 mg by mouth daily as needed for allergies or rhinitis.    [provider]  methenamine (HIPREX) 1 g tablet Take 1 g by mouth 2 (two) times daily. 02/02/24   [provider]  omeprazole  (PRILOSEC ) 20 MG capsule Take 1 capsule (20 mg total) by mouth 2 (two) times daily before a meal. 07/09/23   Zehr, Harlene D, PA-C  ondansetron  (ZOFRAN -ODT) 4 MG disintegrating tablet Take 1 tablet (4 mg total) by mouth every 8 (eight) hours as needed for nausea or vomiting (DISSOLVE ORALLY). 06/18/24 06/13/25  [place], PA-C  psyllium (HYDROCIL/METAMUCIL) 95 % PACK Take 1 packet by mouth daily. 03/28/24   Regalado, Belkys A, MD  saccharomyces boulardii (FLORASTOR) 250 MG capsule Take 1 capsule (250 mg total) by mouth 2 (two) times daily. 12/10/23   May, Deanna J, NP    Allergies: Penicillins    Review of Systems  Updated Vital Signs BP (!) 138/95 (BP Location: Right Arm)   Pulse 80   Temp 98.4 F (36.9 C) (Oral)   Resp 16   Wt 54 kg   SpO2 99%   BMI 19.21 kg/m   Physical Exam Constitutional:      Comments: Alert.  Nontoxic.  Clear mental status.  Well-nourished well-developed.  Good physical condition for age.  HENT:     Head: Normocephalic and atraumatic.     Mouth/Throat:     Mouth: Mucous membranes are moist.     Pharynx: Oropharynx is clear.  Eyes:     Extraocular Movements: Extraocular movements intact.     Pupils: Pupils are equal, round, and reactive to light.  Cardiovascular:     Rate and Rhythm: Normal rate. Rhythm irregular.  Pulmonary:     Effort: Pulmonary effort is normal.     Breath sounds: Normal breath sounds.  Abdominal:     General: There is no distension.     Palpations: Abdomen is soft.     Tenderness: There is no abdominal tenderness. There is no guarding.  Musculoskeletal:     Comments: Left hand is slightly cooler to touch than the right.  Subtle color differential.  Not obvious  pallor.  Left radial pulse is not palpable manually.  Patient has easily palpable popliteal and brachial pulse on the left and axillary pulse.  Radial pulses easily palpable on the right wrist.  With hand-held Doppler patient has a monophasic present radial pulse on the left.  Bilateral lower extremities no peripheral edema.  Calves are soft and pliable.  Feet are in good condition with no swelling or wounds.  Skin:    General: Skin is warm and dry.  Neurological:     General: No focal deficit present.     Mental Status: She is oriented to person,  place, and time.     Cranial Nerves: No cranial nerve deficit.     Motor: No weakness.     Coordination: Coordination normal.     Comments: Bilateral upper extremities have symmetric grip strength.  Has intact use of both upper extremities.  Cranial nerves II through XII intact.  Lower extremities normal motor strength.  Psychiatric:        Mood and Affect: Mood normal.     (all labs ordered are listed, but only abnormal results are displayed) Labs Reviewed  CBC - Abnormal; Notable for the following components:      Result Value   Hemoglobin 11.9 (*)    All other components within normal limits  COMPREHENSIVE METABOLIC PANEL WITH GFR - Abnormal; Notable for the following components:   CO2 21 (*)    Glucose, Bld 116 (*)    Creatinine, Ser 1.56 (*)    AST 14 (*)    GFR, Estimated 33 (*)    All other components within normal limits  I-STAT CHEM 8, ED - Abnormal; Notable for the following components:   BUN 24 (*)    Creatinine, Ser 1.70 (*)    Hemoglobin 9.9 (*)    HCT 29.0 (*)    All other components within normal limits  TROPONIN T, HIGH SENSITIVITY - Abnormal; Notable for the following components:   Troponin T High Sensitivity 25 (*)    All other components within normal limits  PROTIME-INR  APTT  DIFFERENTIAL  ETHANOL  URINE DRUG SCREEN  I-STAT CHEM 8, ED  TROPONIN T, HIGH SENSITIVITY    EKG: EKG  Interpretation Date/Time:  Tuesday September 09 2024 15:55:27 EST Ventricular Rate:  87 PR Interval:    QRS Duration:  119 QT Interval:  400 QTC Calculation: 482 R Axis:   71  Text Interpretation: Atrial fibrillation IRBBB and LPFB Low voltage, precordial leads agree, no sig change from previous Confirmed by Armenta Canning [phone]) on 09/09/2024 4:56:56 PM  Radiology: CT ANGIO CODE STROKE CHEST/ABD/PEL FOR DISSECTION W &/OR WO CONTRAST Result Date: 09/09/2024 CLINICAL DATA:  Neurologic deficit. Concern for aortic dissection. Weakness. EXAM: CT ANGIOGRAPHY CHEST, ABDOMEN AND PELVIS TECHNIQUE: Non-contrast CT of the chest was initially obtained. Multidetector CT imaging through the chest, abdomen and pelvis was performed using the standard protocol during bolus administration of intravenous contrast. Multiplanar reconstructed images and MIPs were obtained and reviewed to evaluate the vascular anatomy. RADIATION DOSE REDUCTION: This exam was performed according to the departmental dose-optimization program which includes automated exposure control, adjustment of the mA and/or kV according to patient size and/or use of iterative reconstruction technique. CONTRAST:  OMNIPAQUE  IOHEXOL  350 MG/ML SOLN COMPARISON:  Chest CT dated 05/28/2024. FINDINGS: CTA CHEST FINDINGS Cardiovascular: There is mild cardiomegaly with biatrial dilatation. A 14 x 7 mm lesion in the left atrium is not characterized on this CT but may represent a blood clot, or a primary cardiac tumor such as myxoma. Further evaluation with echocardiogram or MRI without and with contrast recommended. No pericardial effusion. There is coronary vascular calcification. There is moderate atherosclerotic calcification of the thoracic aorta. No aneurysmal dilatation or dissection. The origins of the great vessels of the aortic arch are patent. No pulmonary artery embolus identified. Mediastinum/Nodes: No hilar or mediastinal adenopathy. There is a  moderate size hiatal hernia containing a portion of the spine. The esophagus is grossly unremarkable. No mediastinal fluid collection. Lungs/Pleura: Stable area of consolidation in the right middle lobe, likely scarring. Similar appearance of a  cluster of nodularity in the left upper lobe with the largest nodule measuring up to 6 mm. Several additional faint ground-glass nodules in the left lower lobe measure up to 6 mm and similar to prior CT. Additional ground-glass nodule in the right upper lobe measure up to 5 mm and also similar to prior CT. There is trace right pleural effusion. No new consolidation. No pneumothorax. The central airways are patent. Musculoskeletal: Osteopenia with degenerative changes of the spine. No acute osseous pathology. Review of the MIP images confirms the above findings. CTA ABDOMEN AND PELVIS FINDINGS VASCULAR Aorta: Advanced atherosclerotic calcification of the abdominal aorta. No aneurysmal dilatation or dissection. Celiac: The celiac trunk and its major branches are patent. SMA: The SMA is patent. Renals: Atherosclerotic calcification of the left renal artery. 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 The left renal artery remains patent. IMA: The IMA is patent. Inflow: Moderate atherosclerotic calcification of the iliac arteries. No aneurysmal dilatation or dissection. The iliac arteries are patent. Veins: No obvious venous abnormality within the limitations of this arterial phase study. Review of the MIP images confirms the above findings. NON-VASCULAR No intra-abdominal free air or free fluid. Hepatobiliary: Subcentimeter hepatic hypodense lesions are too small to characterize. Cholecystectomy. Dilatation of the common bile duct measuring 16 mm in diameter. No retained calcified stone noted in the central CBD. Pancreas: Unremarkable. No pancreatic ductal dilatation or surrounding inflammatory changes. Spleen: Normal in size without focal abnormality.  Adrenals/Urinary Tract: The adrenal glands unremarkable. Solitary left kidney. There is mild left hydronephrosis. Extrarenal left pelvis with mild pelviectasis. There is mild left hydroureter. No obstructing stone. Underlying urothelial lesion at the level of the left ureterovesical junction as the cause of obstruction is not excluded. The urinary bladder is minimally distended. There is thickened and trabeculated appearance of the bladder wall which may be related to chronic bladder dysfunction. An infiltrative process is not excluded. Cystoscopy may provide better evaluation. There is perivesical stranding. Correlation with urinalysis recommended to exclude cystitis. Stomach/Bowel: There is no bowel obstruction or active inflammation. The appendix is not visualized with certainty. No inflammatory changes identified in the right lower quadrant. Lymphatic: No adenopathy. Reproductive: Hysterectomy. Other: None Musculoskeletal: Osteopenia with degenerative changes of the spine. Lower lumbar posterior fusion. Total right hip arthroplasty. No acute osseous pathology. Review of the MIP images confirms the above findings. IMPRESSION: 1. No aortic dissection or aneurysm. 2. A 14 x 7 mm lesion in the left atrium may represent a blood clot, or a primary cardiac tumor such as myxoma. Further evaluation with echocardiogram or MRI without and with contrast recommended. 3. Mild left hydronephrosis and hydroureter. No obstructing stone. Underlying urothelial lesion at the level of the left ureterovesical junction as the cause of obstruction is not excluded. Further evaluation with cystoscopy is recommended. 4. Thickened and trabeculated appearance of the bladder wall may be related to chronic bladder dysfunction. An infiltrative process is not excluded. Cystoscopy may provide better evaluation. 5. Perivesical stranding. Correlation with urinalysis recommended to exclude cystitis. 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 7. No bowel obstruction. 8.  Aortic Atherosclerosis (ICD10-I70.0). Electronically Signed   By: Vanetta Chou M.D.   On: 09/09/2024 16:14   CT ANGIO HEAD NECK W WO CM Result Date: 09/09/2024 EXAM: CTA HEAD AND NECK WITH AND WITHOUT 09/09/2024 03:43:00 PM TECHNIQUE: CTA of the head and neck was performed with and without the administration of 125 mL of  intravenous contrast (iohexol  (OMNIPAQUE ) 350 MG/ML injection 150 mL IOHEXOL  350 MG/ML SOLN). Multiplanar 2D and/or 3D reformatted images are provided for review. Automated exposure control, iterative reconstruction, and/or weight based adjustment of the mA/kV was utilized to reduce the radiation dose to as low as reasonably achievable. Stenosis of the internal carotid arteries measured using NASCET criteria. COMPARISON: CT head dated 09/09/2024 03:43:00 PM CLINICAL HISTORY: Stroke/TIA, determine embolic source. FINDINGS: CTA NECK: AORTIC ARCH AND ARCH VESSELS: Atherosclerosis of the visualized aortic arch and aortic arch vessel origins. Mild stenosis at the origin of the left subclavian artery. No dissection or arterial injury. CERVICAL CAROTID ARTERIES: Mild atherosclerosis of the right carotid bifurcation without hemodynamically significant stenosis of the mid right cervical ICA. Mild atherosclerosis along the mid left common carotid artery and at the left carotid bifurcation without hemodynamically significant stenosis. Prominent tortuosity of the mid and distal left cervical ICA. Multiple areas of irregularity with subtle narrowing and outpouching of the left cervical ICA which may reflect fibromuscular dysplasia. No dissection or arterial injury. CERVICAL VERTEBRAL ARTERIES: Tortuosity of the V1 segment of the right vertebral artery. No dissection, arterial injury, or significant stenosis. LUNGS AND MEDIASTINUM: Incompletely visualized 1.0 cm nodule in the anterior left upper lobe seen on series 6 image 13. The remainder of the visualized lungs and  mediastinum are unremarkable. SOFT TISSUES: Edentulous maxilla and mandible. Degenerative changes of the left temporomandibular joint. No acute abnormality. BONES: Degenerative changes throughout the visualized spine. Disc space narrowing is greatest at C6-C7. No acute abnormality. CTA HEAD: ANTERIOR CIRCULATION: Atherosclerosis of the carotid siphons without significant stenosis. There is a 5 x 4 x 5 mm laterally and slightly posteriorly directed outpouching along the anterior genia of the right cavernous ICA/right paraophthalmic ICA (series 9 image 105). There is an additional 1.5 mm superiorly directed outpouching along the right paraophthalmic ICA (series 9 image 101). 2.5 mm inferiorly directed outpouching along the left supraclinoid ICA which may reflect an infundibulum at the posterior communicating artery origin versus small aneurysm. No significant stenosis of the anterior cerebral arteries. No significant stenosis of the middle cerebral arteries. POSTERIOR CIRCULATION: Fetal origin of the right PCA. No significant stenosis of the posterior cerebral arteries. No significant stenosis of the basilar artery. No significant stenosis of the vertebral arteries. No aneurysm. OTHER: No dural venous sinus thrombosis on this non-dedicated study. IMPRESSION: 1. No acute large vessel occlusion. 2.  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 3. 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 4. 2.5 mm outpouching along the left supraclinoid ICA, possibly representing an infundibulum versus small aneurysm. 5. Prominent tortuosity of the mid and distal left cervical ICA with multiple areas of irregularity, subtle narrowing, and outpouching, possibly reflecting fibromuscular dysplasia. 6. Mild atherosclerosis as above. 7. Incompletely visualized 1.0 cm left upper lobe pulmonary nodule, with prompt non-contrast chest CT for full evaluation recommended per Fleischner  Society Guidelines. Electronically signed by: Donnice Mania MD 09/09/2024 04:13 PM EST RP Workstation: [id number]   CT HEAD CODE STROKE WO CONTRAST (LKW 0-4.5h, LVO 0-24h) Result Date: 09/09/2024 EXAM: CT HEAD WITHOUT CONTRAST 09/09/2024 03:30:49 PM TECHNIQUE: CT of the head was performed without the administration of intravenous contrast. Automated exposure control, iterative reconstruction, and/or weight based adjustment of the mA/kV was utilized to reduce the radiation dose to as low as reasonably achievable. COMPARISON: Head CT and MRI 12/13/2022. CLINICAL HISTORY: Neuro deficit, acute, stroke suspected. Left arm pain and weakness. FINDINGS: BRAIN AND VENTRICLES: There is  no evidence of an acute infarct, intracranial hemorrhage, mass, midline shift, hydrocephalus, or extra-axial fluid collection. There is mild cerebral atrophy. Cerebral white matter hypodensities are similar to the prior CT and nonspecific but compatible with mild chronic small vessel ischemic disease. Calcified atherosclerosis at the skull base. ORBITS: Bilateral cataract extraction. SINUSES: No acute abnormality. SOFT TISSUES AND SKULL: Small left frontal scalp lipoma. No skull fracture. [place] Stroke Program Early CT Score (ASPECTS) ----- Ganglionic (caudate, IC, lentiform nucleus, insula, M1-M3): 7 Supraganglionic (M4-M6): 3 Total: 10 IMPRESSION: 1. No acute intracranial abnormality. ASPECTS of 10. 2. Mild chronic small vessel ischemic disease. 3. These results were communicated to Dr. FREDRIK Hock at 3:34pm on 09/09/2024 by secure text page via the [facility] messaging system. Electronically signed by: Dasie Hamburg MD 09/09/2024 03:35 PM EST RP Workstation: [id number]     Procedures  CRITICAL CARE Performed by: Ludivina Shines   Total critical care time: 30 minutes  Critical care time was exclusive of separately billable procedures and treating other patients.  Critical care was necessary to treat or prevent imminent or  life-threatening deterioration.  Critical care was time spent personally by me on the following activities: development of treatment plan with patient and/or surrogate as well as nursing, discussions with consultants, evaluation of patient's response to treatment, examination of patient, obtaining history from patient or surrogate, ordering and performing treatments and interventions, ordering and review of laboratory studies, ordering and review of radiographic studies, pulse oximetry and re-evaluation of patient's condition.  Medications Ordered in the ED  iohexol  (OMNIPAQUE ) 350 MG/ML injection 150 mL (125 mLs Intravenous Contrast Given 09/09/24 1535)                                    Medical Decision Making Risk Decision regarding hospitalization.   Presents as outlined.  She was seen in triage and noted to have left upper extremity numbness.  Code stroke was activated at that time.  Also ordered for CT angiograms of chest abdomen pelvis as well as head and neck.  My first exam the patient is alert and appropriate.  She shows no signs of confusion.  She has isolated left upper extremity numbness that was precipitated initially with intense pain.  On examination patient has a radial pulse deficit sit to palpation.  Patient has a monophasic pulse present on hand-held Doppler at bedside.  Viewing of the CT angiograms patient has a suspicion of thrombus in the left atrium versus myxoma or other possible tumor.  With patient's history of atrial fibrillation without anticoagulation and current symptoms, patient has high probability of thrombus.  Will start heparin  and proceed with consults.  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  Consult: Reviewed with Dr. Georganna Archer cardiology.  He has personally reviewed the CT imaging and agrees this is high probability to be atrial thrombus.  Will proceed with echo  study with bubble and empirically starting heparin .  Patient and her daughter at bedside are updated with current findings and plan.  They voiced understanding.  Consult: Dr. Charlton for admission [facility] hospitalist     Final diagnoses:  Left atrial thrombus  Numbness of left hand    ED Discharge Orders     None          Shines Ludivina, MD 09/09/24 1710  "

## 2024-09-09 NOTE — Consult Note (Signed)
 [facility] Telemedicine Consult   Requesting Provider: Pfeiffer Consult Participants: Nurse, family Location of the provider: [facility] Location of the patient: [facility] ED  This consult was provided via telemedicine with 2-way video and audio communication. The patient/family was informed that care would be provided in this way and agreed to receive care in this manner.    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  Patient reports on coming back from the bathroom today had acute onset of left arm pain.  Laid down and rested with some improvement then noted left arm numbness and weakness.  Presented for evaluation at that time.    LKW: 09/09/2024 @ 1030 tnk given?: No, Outside time window IR Thrombectomy? No, No target lesion identified Modified Rankin Scale: 0-Completely asymptomatic and back to baseline post- stroke Time of teleneurologist evaluation: 1516  Exam: Vitals:   09/09/24 1453  BP: (!) 138/95  Pulse: 80  Resp: 16  Temp: 98.4 F (36.9 C)  SpO2: 99%    General: NAD  1A: Level of Consciousness - 0 1B: Ask Month and Age - 0 1C: 'Blink Eyes' & 'Squeeze Hands' - 0 2: Test Horizontal Extraocular Movements - 0 3: Test Visual Fields - 0 4: Test Facial Palsy - 0 5A: Test Left Arm Motor Drift - 1 5B: Test Right Arm Motor Drift - 0 6A: Test Left Leg Motor Drift - 0 6B: Test Right Leg Motor Drift - 0 7: Test Limb Ataxia - 0 8: Test Sensation - 1 9: Test Language/Aphasia- 0 10: Test Dysarthria - 0 11: Test Extinction/Inattention - 0 NIHSS score: 2   Imaging Reviewed:   CT HEAD WITHOUT CONTRAST 09/09/2024 03:30:49 PM   TECHNIQUE: CT of the head was performed without the administration of intravenous contrast. Automated exposure control, iterative  reconstruction, and/or weight based adjustment of the mA/kV was utilized to reduce the radiation dose to as low as reasonably achievable.   COMPARISON: Head CT and MRI 12/13/2022.   CLINICAL HISTORY: Neuro deficit, acute, stroke suspected. Left arm pain and weakness.   FINDINGS:   BRAIN AND VENTRICLES: There is no evidence of an acute infarct, intracranial hemorrhage, mass, midline shift, hydrocephalus, or extra-axial fluid collection. There is mild cerebral atrophy. Cerebral white matter hypodensities are similar to the prior CT and nonspecific but compatible with mild chronic small vessel ischemic disease. Calcified atherosclerosis at the skull base.   ORBITS: Bilateral cataract extraction.   SINUSES: No acute abnormality.   SOFT TISSUES AND SKULL: Small left frontal scalp lipoma. No skull fracture.   Alberta Stroke Program Early CT Score (ASPECTS) ----- Ganglionic (caudate, IC, lentiform nucleus, insula, M1-M3): 7 Supraganglionic (M4-M6): 3 Total: 10   IMPRESSION: 1. No acute intracranial abnormality. ASPECTS of 10. 2. Mild chronic small vessel ischemic disease. 3. These results were communicated to Dr. FREDRIK Hock at 3:34pm on 09/09/2024 by secure text page via the [facility] messaging system.  CTA HEAD AND NECK WITH AND WITHOUT 09/09/2024 03:43:00 PM   TECHNIQUE: CTA of the head and neck was performed with and without the administration of 125 mL of intravenous contrast (iohexol  (OMNIPAQUE ) 350 MG/ML injection 150 mL IOHEXOL  350 MG/ML SOLN). Multiplanar 2D and/or 3D reformatted images are provided for review. Automated exposure control, iterative reconstruction, and/or weight based adjustment  of the mA/kV was utilized to reduce the radiation dose to as low as reasonably achievable. Stenosis of the internal carotid arteries measured using NASCET criteria.   COMPARISON: CT head dated 09/09/2024 03:43:00 PM   CLINICAL HISTORY: Stroke/TIA, determine embolic  source.   FINDINGS:   CTA NECK: AORTIC ARCH AND ARCH VESSELS: Atherosclerosis of the visualized aortic arch and aortic arch vessel origins. Mild stenosis at the origin of the left subclavian artery. No dissection or arterial injury.   CERVICAL CAROTID ARTERIES: Mild atherosclerosis of the right carotid bifurcation without hemodynamically significant stenosis of the mid right cervical ICA. Mild atherosclerosis along the mid left common carotid artery and at the left carotid bifurcation without hemodynamically significant stenosis. Prominent tortuosity of the mid and distal left cervical ICA. Multiple areas of irregularity with subtle narrowing and outpouching of the left cervical ICA which may reflect fibromuscular dysplasia. No dissection or arterial injury.   CERVICAL VERTEBRAL ARTERIES: Tortuosity of the V1 segment of the right vertebral artery. No dissection, arterial injury, or significant stenosis.   LUNGS AND MEDIASTINUM: Incompletely visualized 1.0 cm nodule in the anterior left upper lobe seen on series 6 image 13. The remainder of the visualized lungs and mediastinum are unremarkable.   SOFT TISSUES: Edentulous maxilla and mandible. Degenerative changes of the left temporomandibular joint. No acute abnormality.   BONES: Degenerative changes throughout the visualized spine. Disc space narrowing is greatest at C6-C7. No acute abnormality.   CTA HEAD: ANTERIOR CIRCULATION: Atherosclerosis of the carotid siphons without significant stenosis. There is a 5 x 4 x 5 mm laterally and slightly posteriorly directed outpouching along the anterior genia of the right cavernous ICA/right paraophthalmic ICA (series 9 image 105). There is an additional 1.5 mm superiorly directed outpouching along the right paraophthalmic ICA (series 9 image 101). 2.5 mm inferiorly directed outpouching along the left supraclinoid ICA which may reflect an infundibulum at the posterior communicating  artery origin versus small aneurysm. No significant stenosis of the anterior cerebral arteries. No significant stenosis of the middle cerebral arteries.   POSTERIOR CIRCULATION: Fetal origin of the right PCA. No significant stenosis of the posterior cerebral arteries. No significant stenosis of the basilar artery. No significant stenosis of the vertebral arteries. No aneurysm.   OTHER: No dural venous sinus thrombosis on this non-dedicated study.   IMPRESSION: 1. No acute large vessel occlusion. 2.  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 3. 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 4. 2.5 mm outpouching along the left supraclinoid ICA, possibly representing an infundibulum versus small aneurysm. 5. Prominent tortuosity of the mid and distal left cervical ICA with multiple areas of irregularity, subtle narrowing, and outpouching, possibly reflecting fibromuscular dysplasia. 6. Mild atherosclerosis as above. 7. Incompletely visualized 1.0 cm left upper lobe pulmonary nodule, with prompt non-contrast chest CT for full evaluation recommended per Fleischner Society Guidelines.    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  Head CT personally reviewed and shows no acute changes.  CTA of the head and neck personally reviewed and shows no evidence of LVO.  Patient outside time window for thrombolytics and no target lesion was identified for thrombectomy but with AF and not on OAC.  Acute infarct  remains in the differential as well as a vascular occlusion that is more native to the left arm.  It appears from ED exam that left arm occlusion may be more likely.  Patient started on anticoagulation.     Recommendations:  MRI of the brain without contrast Frequent neuro checks Telemetry  Case discussed with Dr. Armenta  This patient is receiving care for possible acute neurological changes. Care by this provider at the time of service included time for direct evaluation via telemedicine, review of medical records, imaging studies and discussion of findings with providers, the patient and/or family.  Sonny Hock, MD Neurology   If 8pm- 8am, please page neurology on call as listed in [facility].

## 2024-09-09 NOTE — ED Provider Triage Note (Signed)
 Emergency Medicine Provider Triage Evaluation Note  [name] , a 79 y.o. female  was evaluated in triage.  Pt complains of sudden onset Left arm pain and weakness. LKW 11:15 am.  No other neuro deficits Diminished Left radial and left DP pulse.  Hx of afib not on anticoagulation  Review of Systems  Positive: Weakness and pain  Negative: Chest pain  Physical Exam  BP (!) 138/95 (BP Location: Right Arm)   Pulse 80   Temp 98.4 F (36.9 C) (Oral)   Resp 16   SpO2 99%  Gen:   Awake, no distress   Resp:  Normal effort  MSK:   Left arm objectively weak Other:  LEFT Radial and Left DP pulse dimiminished    Medical Decision Making  Medically screening exam initiated at 3:12 PM.  Appropriate orders placed.  [name] was informed that the remainder of the evaluation will be completed by another provider, this initial triage assessment does not replace that evaluation, and the importance of remaining in the ED until their evaluation is complete.  Stroke VS. Dissection Vs. Arterial thromboembolism  Code stroke initiated    Arloa Chroman, PA-C 09/09/24 1516

## 2024-09-09 NOTE — H&P (Signed)
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 hydroureteronephrosis  - Patient recently started ciprofloxacin  for UTI, has ongoing symptoms, and mild left hydroureteronephrosis noted on CT in ED without obstructing stone  - Treat with Rocephin  for now, discussed CT findings with patient and her daughter, she will need urology follow-up   4. Lung nodule; non-small cell lung cancer  - S/p curative SBRT to RML nodule, currently under observation with Dr. Sherrod  - Possible LUL nodule noted on CT in ED, discussed with patient and her daughter, she will need close follow-up   5. Atrial fibrillation  - No longer anticoagulated due to recurrent bleeding  - Currently on IV heparin  as above, will need close monitoring; continue bisoprolol     6. CKD 3A  - SCr is 1.56 on admission, up from 1.36 in October 2025  - Renally-dose medications, monitor     DVT prophylaxis: IV heparin   Code Status: Full  Level of Care: Level of care: Telemetry Family Communication: Daughter at bedside    Disposition Plan:  Patient is from: Home  Anticipated d/c is to: Home  Anticipated d/c date is: TBD Patient  currently: Pending MRI brain, echocardiogram, vascular surgery consultation, clinical stability Consults called: Cardiology, vascular surgery, teleneurology  Admission status: Observation     Evalene GORMAN Sprinkles, MD [facility]  09/09/2024, 5:56 PM       [1]  Allergies Allergen Reactions   Penicillins Rash   "
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
## 2024-09-09 NOTE — ED Triage Notes (Signed)
 Pt has c/o left arm pain, with decreased sensation in arm and hand that began 4 hours ago. Pt has a hx of Afib and is not on any thinners

## 2024-09-09 NOTE — ED Notes (Signed)
 Patient transported to MRI

## 2024-09-10 ENCOUNTER — Observation Stay (HOSPITAL_COMMUNITY)

## 2024-09-10 DIAGNOSIS — Z96641 Presence of right artificial hip joint: Secondary | ICD-10-CM | POA: Diagnosis present

## 2024-09-10 DIAGNOSIS — I5189 Other ill-defined heart diseases: Secondary | ICD-10-CM

## 2024-09-10 DIAGNOSIS — Z8249 Family history of ischemic heart disease and other diseases of the circulatory system: Secondary | ICD-10-CM | POA: Diagnosis not present

## 2024-09-10 DIAGNOSIS — M25542 Pain in joints of left hand: Secondary | ICD-10-CM | POA: Diagnosis not present

## 2024-09-10 DIAGNOSIS — I513 Intracardiac thrombosis, not elsewhere classified: Secondary | ICD-10-CM | POA: Diagnosis present

## 2024-09-10 DIAGNOSIS — N136 Pyonephrosis: Secondary | ICD-10-CM | POA: Diagnosis present

## 2024-09-10 DIAGNOSIS — J449 Chronic obstructive pulmonary disease, unspecified: Secondary | ICD-10-CM | POA: Diagnosis present

## 2024-09-10 DIAGNOSIS — Q453 Other congenital malformations of pancreas and pancreatic duct: Secondary | ICD-10-CM | POA: Diagnosis not present

## 2024-09-10 DIAGNOSIS — E039 Hypothyroidism, unspecified: Secondary | ICD-10-CM | POA: Diagnosis present

## 2024-09-10 DIAGNOSIS — Z743 Need for continuous supervision: Secondary | ICD-10-CM | POA: Diagnosis not present

## 2024-09-10 DIAGNOSIS — N1832 Chronic kidney disease, stage 3b: Secondary | ICD-10-CM | POA: Diagnosis present

## 2024-09-10 DIAGNOSIS — Z7901 Long term (current) use of anticoagulants: Secondary | ICD-10-CM | POA: Diagnosis not present

## 2024-09-10 DIAGNOSIS — I472 Ventricular tachycardia, unspecified: Secondary | ICD-10-CM | POA: Diagnosis not present

## 2024-09-10 DIAGNOSIS — Z87891 Personal history of nicotine dependence: Secondary | ICD-10-CM | POA: Diagnosis not present

## 2024-09-10 DIAGNOSIS — E7800 Pure hypercholesterolemia, unspecified: Secondary | ICD-10-CM | POA: Diagnosis present

## 2024-09-10 DIAGNOSIS — I5032 Chronic diastolic (congestive) heart failure: Secondary | ICD-10-CM | POA: Diagnosis present

## 2024-09-10 DIAGNOSIS — M79602 Pain in left arm: Secondary | ICD-10-CM | POA: Diagnosis present

## 2024-09-10 DIAGNOSIS — E1122 Type 2 diabetes mellitus with diabetic chronic kidney disease: Secondary | ICD-10-CM | POA: Diagnosis present

## 2024-09-10 DIAGNOSIS — I4819 Other persistent atrial fibrillation: Secondary | ICD-10-CM | POA: Diagnosis present

## 2024-09-10 DIAGNOSIS — F32A Depression, unspecified: Secondary | ICD-10-CM | POA: Diagnosis present

## 2024-09-10 DIAGNOSIS — I4729 Other ventricular tachycardia: Secondary | ICD-10-CM | POA: Diagnosis not present

## 2024-09-10 DIAGNOSIS — Z880 Allergy status to penicillin: Secondary | ICD-10-CM | POA: Diagnosis not present

## 2024-09-10 DIAGNOSIS — I700 Atherosclerosis of aorta: Secondary | ICD-10-CM | POA: Diagnosis present

## 2024-09-10 DIAGNOSIS — R200 Anesthesia of skin: Secondary | ICD-10-CM | POA: Diagnosis present

## 2024-09-10 DIAGNOSIS — Z79890 Hormone replacement therapy (postmenopausal): Secondary | ICD-10-CM | POA: Diagnosis not present

## 2024-09-10 DIAGNOSIS — Z905 Acquired absence of kidney: Secondary | ICD-10-CM | POA: Diagnosis not present

## 2024-09-10 DIAGNOSIS — R911 Solitary pulmonary nodule: Secondary | ICD-10-CM | POA: Diagnosis present

## 2024-09-10 DIAGNOSIS — I130 Hypertensive heart and chronic kidney disease with heart failure and stage 1 through stage 4 chronic kidney disease, or unspecified chronic kidney disease: Secondary | ICD-10-CM | POA: Diagnosis present

## 2024-09-10 DIAGNOSIS — I5181 Takotsubo syndrome: Secondary | ICD-10-CM | POA: Diagnosis present

## 2024-09-10 DIAGNOSIS — Z85118 Personal history of other malignant neoplasm of bronchus and lung: Secondary | ICD-10-CM | POA: Diagnosis not present

## 2024-09-10 LAB — COMPREHENSIVE METABOLIC PANEL WITH GFR
ALT: 5 U/L (ref 0–44)
AST: 12 U/L — ABNORMAL LOW (ref 15–41)
Albumin: 3.6 g/dL (ref 3.5–5.0)
Alkaline Phosphatase: 71 U/L (ref 38–126)
Anion gap: 9 (ref 5–15)
BUN: 18 mg/dL (ref 8–23)
CO2: 25 mmol/L (ref 22–32)
Calcium: 9.3 mg/dL (ref 8.9–10.3)
Chloride: 109 mmol/L (ref 98–111)
Creatinine, Ser: 1.47 mg/dL — ABNORMAL HIGH (ref 0.44–1.00)
GFR, Estimated: 36 mL/min — ABNORMAL LOW
Glucose, Bld: 80 mg/dL (ref 70–99)
Potassium: 4 mmol/L (ref 3.5–5.1)
Sodium: 142 mmol/L (ref 135–145)
Total Bilirubin: 0.3 mg/dL (ref 0.0–1.2)
Total Protein: 6.3 g/dL — ABNORMAL LOW (ref 6.5–8.1)

## 2024-09-10 LAB — CBC
HCT: 33.5 % — ABNORMAL LOW (ref 36.0–46.0)
Hemoglobin: 10.6 g/dL — ABNORMAL LOW (ref 12.0–15.0)
MCH: 30.8 pg (ref 26.0–34.0)
MCHC: 31.6 g/dL (ref 30.0–36.0)
MCV: 97.4 fL (ref 80.0–100.0)
Platelets: 191 K/uL (ref 150–400)
RBC: 3.44 MIL/uL — ABNORMAL LOW (ref 3.87–5.11)
RDW: 13.8 % (ref 11.5–15.5)
WBC: 6.5 K/uL (ref 4.0–10.5)
nRBC: 0 % (ref 0.0–0.2)

## 2024-09-10 LAB — I-STAT CHEM 8, ED
BUN: 22 mg/dL (ref 8–23)
Calcium, Ion: 1.3 mmol/L (ref 1.15–1.40)
Chloride: 109 mmol/L (ref 98–111)
Creatinine, Ser: 1.7 mg/dL — ABNORMAL HIGH (ref 0.44–1.00)
Glucose, Bld: 113 mg/dL — ABNORMAL HIGH (ref 70–99)
HCT: 37 % (ref 36.0–46.0)
Hemoglobin: 12.6 g/dL (ref 12.0–15.0)
Potassium: 4.1 mmol/L (ref 3.5–5.1)
Sodium: 144 mmol/L (ref 135–145)
TCO2: 22 mmol/L (ref 22–32)

## 2024-09-10 LAB — URINE DRUG SCREEN
Amphetamines: NEGATIVE
Barbiturates: NEGATIVE
Benzodiazepines: NEGATIVE
Cocaine: NEGATIVE
Fentanyl: POSITIVE — AB
Methadone Scn, Ur: NEGATIVE
Opiates: NEGATIVE
Tetrahydrocannabinol: NEGATIVE

## 2024-09-10 LAB — LIPID PANEL
Cholesterol: 138 mg/dL (ref 0–200)
HDL: 56 mg/dL
LDL Cholesterol: 68 mg/dL (ref 0–99)
Total CHOL/HDL Ratio: 2.5 ratio
Triglycerides: 70 mg/dL
VLDL: 14 mg/dL (ref 0–40)

## 2024-09-10 LAB — ECHOCARDIOGRAM COMPLETE BUBBLE STUDY
AR max vel: 2.66 cm2
AV Area VTI: 2.91 cm2
AV Area mean vel: 2.63 cm2
AV Mean grad: 3 mmHg
AV Peak grad: 5.6 mmHg
Ao pk vel: 1.18 m/s
Area-P 1/2: 4.44 cm2
Calc EF: 74.2 %
MV VTI: 2.56 cm2
S' Lateral: 2.2 cm
Single Plane A2C EF: 75.2 %
Single Plane A4C EF: 72.4 %

## 2024-09-10 LAB — HEMOGLOBIN A1C
Hgb A1c MFr Bld: 5.5 % (ref 4.8–5.6)
Mean Plasma Glucose: 111.15 mg/dL

## 2024-09-10 LAB — HEPARIN LEVEL (UNFRACTIONATED)
Heparin Unfractionated: 0.33 [IU]/mL (ref 0.30–0.70)
Heparin Unfractionated: 0.2 [IU]/mL — ABNORMAL LOW (ref 0.30–0.70)

## 2024-09-10 MED ORDER — PERFLUTREN LIPID MICROSPHERE
1.0000 mL | INTRAVENOUS | Status: AC | PRN
  Administered 2024-09-10: 2 mL via INTRAVENOUS

## 2024-09-10 NOTE — Evaluation (Signed)
 Occupational Therapy Evaluation Patient Details Name: [name] MRN: [id number] DOB: Oct 02, 1945 Today's Date: 09/10/2024   History of Present Illness   79 y.o. female presents to therapy following hospital admission on 09/09/2024 due to L arm weakness and severe pain. MRI negative for acute changes. Suspect LUE embolic event, started on heparin . Pt PMH includes but is not limited to: CKD 3A, history of CVA, atrial fibrillation on Eliquis , depression, anxiety, hypothyroidism, recent treatment for C. difficile colitis, chronic bilateral knee pain and recurrent UTIs.     Clinical Impressions Pt admitted with the above concerns. Pt currently with functional limitations due to the deficits listed below (see OT Problem List). Pt reports that she lives with her spouse and receives minimal assistance/supervision with BADL tasks. Utilizes Rolator for functional mobility.  Pt will benefit from acute skilled OT to increase their safety and independence with ADL and functional mobility for ADL to facilitate discharge. Recommend Home Health OT services to increase functional use of LUE when discharged home.       If plan is discharge home, recommend the following:   A little help with walking and/or transfers;A little help with bathing/dressing/bathroom     Functional Status Assessment   Patient has had a recent decline in their functional status and demonstrates the ability to make significant improvements in function in a reasonable and predictable amount of time.     Equipment Recommendations   None recommended by OT      Precautions/Restrictions   Precautions Precautions: Fall Recall of Precautions/Restrictions: Intact Restrictions Weight Bearing Restrictions Per Provider Order: No     Mobility Bed Mobility Overal bed mobility: Needs Assistance Bed Mobility: Supine to Sit, Sit to Supine     Supine to sit: Min assist Sit to supine: Min assist   General bed mobility  comments: limited due to LUE pain, MIN A for scoot towards edge of gurney and posterior scooting assistance when returning to supine; limited due to height of gurney    Transfers Overall transfer level: Needs assistance Equipment used: Rolling walker (2 wheels) Transfers: Sit to/from Stand Sit to Stand: Min assist           General transfer comment: stands from edge of gurney and low toilet at MIN A with cues for hand placement and guarding of L arm due to pain      Balance Overall balance assessment: Needs assistance Sitting-balance support: Single extremity supported (unable to rest feet on floor due to height of gurney) Sitting balance-Leahy Scale: Fair     Standing balance support: Bilateral upper extremity supported, During functional activity Standing balance-Leahy Scale: Fair          ADL either performed or assessed with clinical judgement   ADL Overall ADL's : Needs assistance/impaired     Grooming: Set up;Bed level   Upper Body Bathing: Moderate assistance;Sitting   Lower Body Bathing: Moderate assistance;Sitting/lateral leans   Upper Body Dressing : Minimal assistance;Sitting   Lower Body Dressing: Moderate assistance;Sitting/lateral leans   Toilet Transfer: Minimal assistance;Ambulation;Regular Toilet;Rolling walker (2 wheels);Grab bars   Toileting- Clothing Manipulation and Hygiene: Set up       Functional mobility during ADLs: Minimal assistance;Rolling walker (2 wheels) (Pt walked from room in ED to bathroom in hallways with 1 person assist. Able to walk half way back to room with same level assist although d/t pain preferred 2 person hand held assist to return to room.)       Vision Baseline Vision/History: 1 Wears glasses  Ability to See in Adequate Light: 0 Adequate Patient Visual Report: No change from baseline Vision Assessment?: No apparent visual deficits     Perception Perception: Within Functional Limits       Praxis Praxis: WFL        Pertinent Vitals/Pain Pain Assessment Pain Assessment: Faces Faces Pain Scale: Hurts whole lot Pain Location: L arm - with use/movement Pain Descriptors / Indicators: Throbbing Pain Intervention(s): Limited activity within patient's tolerance, Monitored during session, Heat applied     Extremity/Trunk Assessment Upper Extremity Assessment Upper Extremity Assessment: Right hand dominant;LUE deficits/detail LUE Deficits / Details: Limited ROM and functional use of LUE d/t pain. Able to demonstrate A/ROM shoulder flexion ~25% range. Full elbow flexion/extension, wrist supination/pronation, wrist flexion/extension. Weak gross grasp. Arthritic deformities noted in both hands.   Lower Extremity Assessment Lower Extremity Assessment: Defer to PT evaluation    Cervical / Trunk Assessment Cervical / Trunk Assessment: Kyphotic (significant rounded shoulders and forward lean in standing)   Communication Communication Communication: No apparent difficulties   Cognition Arousal: Alert Behavior During Therapy: WFL for tasks assessed/performed Cognition: No apparent impairments     Following commands: Intact       Cueing  General Comments   Cueing Techniques: Verbal cues  VSS on RA           Home Living Family/patient expects to be discharged to:: Private residence Living Arrangements: Spouse/significant other Available Help at Discharge: Family;Available 24 hours/day Type of Home: Other(Comment) (Condo) Home Access: Stairs to enter Entrance Stairs-Number of Steps: 3 Entrance Stairs-Rails: Left Home Layout: One level     Bathroom Shower/Tub: Producer, Television/film/video: Handicapped height Bathroom Accessibility: Yes   Home Equipment: Rollator (4 wheels);Cane - single point;Wheelchair - Nurse, Children[facility] (2 wheels);Shower seat;Transport chair          Prior Functioning/Environment Prior Level of Function : Needs assist       Physical Assist  : ADLs (physical)     Mobility Comments: ambulates with rollator at home, history of falls, transport chair for community distances (out to eat) ADLs Comments: IND with ADLs, needs some assistance with fine motor coordination tasks, SPV for showers due to fall history    OT Problem List: Decreased strength;Decreased range of motion;Decreased coordination;Impaired UE functional use;Pain   OT Treatment/Interventions: Therapeutic exercise;Self-care/ADL training;Neuromuscular education;DME and/or AE instruction;Manual therapy;Modalities;Therapeutic activities;Patient/family education;Balance training      OT Goals(Current goals can be found in the care plan section)   Acute Rehab OT Goals Patient Stated Goal: to use the bathroom OT Goal Formulation: With patient Time For Goal Achievement: 09/24/24 Potential to Achieve Goals: Good   OT Frequency:  Min 2X/week    Co-evaluation PT/OT/SLP Co-Evaluation/Treatment: Yes Reason for Co-Treatment: To address functional/ADL transfers PT goals addressed during session: Mobility/safety with mobility;Strengthening/ROM;Proper use of DME OT goals addressed during session: ADL's and self-care;Proper use of Adaptive equipment and DME;Strengthening/ROM      AM-PAC OT 6 Clicks Daily Activity     Outcome Measure Help from another person eating meals?: A Little Help from another person taking care of personal grooming?: A Little Help from another person toileting, which includes using toliet, bedpan, or urinal?: A Little Help from another person bathing (including washing, rinsing, drying)?: A Lot Help from another person to put on and taking off regular upper body clothing?: A Little Help from another person to put on and taking off regular lower body clothing?: A Little 6 Click Score: 17   End of Session  Equipment Utilized During Treatment: Gait belt;Rolling walker (2 wheels) Nurse Communication: Mobility status  Activity Tolerance: Patient  limited by pain Patient left: in bed;with call bell/phone within reach;with nursing/sitter in room;with family/visitor present  OT Visit Diagnosis: Muscle weakness (generalized) (M62.81);Pain Pain - Right/Left: Left Pain - part of body: Arm                Time: 8995-8957 OT Time Calculation (min): 38 min Charges:  OT General Charges $OT Visit: 1 Visit OT Evaluation $OT Eval Moderate Complexity: 1 Mod OT Treatments $Self Care/Home Management : 8-22 mins  Leita Howell, OTR/L,CBIS  Supplemental OT - [facility] and [facility] Secure Chat Preferred    Tadeo Besecker, Leita BIRCH 09/10/2024, 1:22 PM

## 2024-09-10 NOTE — Evaluation (Signed)
 Physical Therapy Evaluation Patient Details Name: [name] MRN: [id number] DOB: 1945-10-11 Today's Date: 09/10/2024  History of Present Illness  79 y.o. female presents to therapy following hospital admission on 09/09/2024 due to L arm weakness and severe pain. MRI negative for acute changes. Suspect LUE embolic event, started on heparin . Pt PMH includes but is not limited to: CKD 3A, history of CVA, atrial fibrillation on Eliquis , depression, anxiety, hypothyroidism, recent treatment for C. difficile colitis, chronic bilateral knee pain and recurrent UTIs.  Clinical Impression  PTA, pt was IND with mobility using a rollator and requires intermittent assistance for ADLs secondary to chronic knee pain. At evaluation, pt requires MIN A 1-2 people secondary to elevated pain in bilateral knees and LUE which limits mobility. Tolerates ambulation to bathroom initially with RW but then opting for 2 person HHA to minimize pain. Daughter present during session, confirms pt's mobility is near baseline. See below for current deficits. Pt will benefit from continued therapy at discharge with HHPT.          If plan is discharge home, recommend the following: A little help with walking and/or transfers;A little help with bathing/dressing/bathroom;Assist for transportation;Help with stairs or ramp for entrance;Assistance with cooking/housework   Can travel by private vehicle        Equipment Recommendations None recommended by PT  Recommendations for Other Services       Functional Status Assessment Patient has had a recent decline in their functional status and demonstrates the ability to make significant improvements in function in a reasonable and predictable amount of time.     Precautions / Restrictions Precautions Precautions: Fall Recall of Precautions/Restrictions: Intact Restrictions Weight Bearing Restrictions Per Provider Order: No      Mobility  Bed Mobility Overal bed  mobility: Needs Assistance Bed Mobility: Supine to Sit, Sit to Supine     Supine to sit: Min assist Sit to supine: Min assist   General bed mobility comments: limited due to LUE pain, MIN A for scoot towards edge of gurney and posterior scooting assistance when returning to supine; limited due to height of gurney    Transfers Overall transfer level: Needs assistance Equipment used: Rolling walker (2 wheels) Transfers: Sit to/from Stand Sit to Stand: Min assist           General transfer comment: stands from edge of gurney and low toilet at MIN A with cues for hand placement and guarding of L arm due to pain    Ambulation/Gait Ambulation/Gait assistance: +2 safety/equipment, Min assist Gait Distance (Feet): 100 Feet Assistive device: Rolling walker (2 wheels), 2 person hand held assist (amb to bathroom with RW, deferred use of RW on way back to room due to pain in LUE; 2 person HHA provided) Gait Pattern/deviations: Narrow base of support, Trunk flexed, Step-through pattern Gait velocity: decreased     General Gait Details: short stesp with narrow BOS, forward flexed posture, heavy use of RUE on RW due to pain, needed assistance to stear RW, no knee buckling noticed however heavy valgus positioning  Stairs            Wheelchair Mobility     Tilt Bed    Modified Rankin (Stroke Patients Only)       Balance Overall balance assessment: Needs assistance Sitting-balance support: Single extremity supported (unable to rest feet on floor due to height of gurney) Sitting balance-Leahy Scale: Fair     Standing balance support: Bilateral upper extremity supported, During functional activity Standing  balance-Leahy Scale: Fair                               Pertinent Vitals/Pain Pain Assessment Pain Assessment: Faces Faces Pain Scale: Hurts little more Pain Location: L arm Pain Descriptors / Indicators: Throbbing Pain Intervention(s): Limited activity  within patient's tolerance, Monitored during session, Repositioned    Home Living Family/patient expects to be discharged to:: Private residence Living Arrangements: Spouse/significant other Available Help at Discharge: Family;Available 24 hours/day Type of Home: Other(Comment) (Condo)   Entrance Stairs-Rails: Left Entrance Stairs-Number of Steps: 3   Home Layout: One level Home Equipment: Rollator (4 wheels);Cane - single point;Wheelchair - Nurse, Children[facility] (2 wheels);Shower seat;Transport chair      Prior Function Prior Level of Function : Needs assist       Physical Assist : ADLs (physical)     Mobility Comments: ambulates with rollator at home, history of falls, transport chair for community distances (out to eat) ADLs Comments: IND with ADLs, needs some assistance with fine motor coordination tasks, SPV for showers due to fall history     Extremity/Trunk Assessment   Upper Extremity Assessment Upper Extremity Assessment: Defer to OT evaluation;LUE deficits/detail LUE Deficits / Details: pain and grimacing with all motions    Lower Extremity Assessment Lower Extremity Assessment: Generalized weakness;RLE deficits/detail;LLE deficits/detail RLE Deficits / Details: chronic knee pain, intermitten buckling however no reported falls RLE Sensation: WNL LLE Deficits / Details: see RLE details LLE Sensation: WNL    Cervical / Trunk Assessment Cervical / Trunk Assessment: Kyphotic (significant rounded shoulders and forward lean in standing)  Communication   Communication Communication: No apparent difficulties    Cognition Arousal: Alert Behavior During Therapy: WFL for tasks assessed/performed   PT - Cognitive impairments: No apparent impairments                         Following commands: Intact       Cueing Cueing Techniques: Verbal cues     General Comments      Exercises     Assessment/Plan    PT Assessment Patient needs  continued PT services  PT Problem List Decreased strength;Decreased activity tolerance;Decreased balance;Decreased mobility;Pain       PT Treatment Interventions Therapeutic exercise;Therapeutic activities;Functional mobility training;Patient/family education;Gait training;Balance training    PT Goals (Current goals can be found in the Care Plan section)  Acute Rehab PT Goals Patient Stated Goal: get home and have my L arm pain gone PT Goal Formulation: With patient/family Time For Goal Achievement: 09/24/24 Potential to Achieve Goals: Good    Frequency Min 2X/week     Co-evaluation PT/OT/SLP Co-Evaluation/Treatment: Yes Reason for Co-Treatment: To address functional/ADL transfers PT goals addressed during session: Mobility/safety with mobility;Strengthening/ROM;Proper use of DME         AM-PAC PT 6 Clicks Mobility  Outcome Measure Help needed turning from your back to your side while in a flat bed without using bedrails?: A Little Help needed moving from lying on your back to sitting on the side of a flat bed without using bedrails?: A Little Help needed moving to and from a bed to a chair (including a wheelchair)?: A Little Help needed standing up from a chair using your arms (e.g., wheelchair or bedside chair)?: A Little Help needed to walk in hospital room?: A Lot Help needed climbing 3-5 steps with a railing? : A Lot 6 Click Score: 16  End of Session Equipment Utilized During Treatment: Gait belt Activity Tolerance: Patient tolerated treatment well;Patient limited by pain Patient left: in bed;with family/visitor present;with call bell/phone within reach Nurse Communication: Mobility status;Patient requests pain meds PT Visit Diagnosis: Unsteadiness on feet (R26.81);Pain Pain - Right/Left: Left Pain - part of body: Shoulder;Arm;Hand    Time: 8994-8957 PT Time Calculation (min) (ACUTE ONLY): 37 min   Charges:   PT Evaluation $PT Eval Low Complexity: 1 Low              Aayra Hornbaker H. Sherley Mckenney, PT, DPT   [employer] 09/10/2024, 12:32 PM

## 2024-09-10 NOTE — Progress Notes (Signed)
2 D echo completed 
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
## 2024-09-10 NOTE — ED Notes (Signed)
 [facility] called.

## 2024-09-10 NOTE — Progress Notes (Signed)
 " PROGRESS NOTE    [name]  FMW:[id number] DOB: 1946/06/15 DOA: 09/09/2024 PCP: Janey Santos, MD     Brief Narrative:  [name] is a 79 y.o. female with medical history significant for atrial fibrillation no longer anticoagulated due to bleeding, anxiety, hypertension, hyperlipidemia, hypothyroidism, and COPD, now presenting with pain, numbness, and weakness involving the left arm.   Patient reports that she is currently being treated for UTI and has had ongoing urinary symptoms but was otherwise in her usual state of health this morning until approximately 1030 or 11 AM when she developed pain in the distal left upper extremity.  She went on to develop numbness and also weakness in the left arm with symptoms now involving the entire arm.  She denies any headache or any other areas of numbness or weakness.  New events last 24 hours / Subjective: Patient seen in the emergency department, daughter is at bedside.  She states that she started having sudden left forearm pain, described as a muscle spasm.  Pain then radiated to her left hand involving all fingers and then up proximally toward her shoulder but not involving her shoulder.  Subsequently, developed numbness and tingling.  Also with left upper extremity weakness compared to her right.  No neck pain or decreased range of motion of her neck/cervical spine.  No other symptoms such as speech difficulties, vision change.  Assessment & Plan:   Principal Problem:   Left upper extremity numbness Active Problems:   Acute cystitis   Persistent atrial fibrillation (HCC)   Lung nodule   Lung cancer (HCC)   COPD (chronic obstructive pulmonary disease) (HCC)   CKD stage 3a, GFR 45-59 ml/min (HCC)   Atrial thrombus   Hydronephrosis of left kidney   Bladder wall thickening   Left atrial mass   Left upper extremity pain/numbness/tingling/weakness - Teleneurology recommended MRI brain which was negative for stroke - MRI  cervical spine showed widespread chronic cervical degeneration - Question if her pain is a vascular event in setting of left atrial thrombus  Left atrial thrombus - Cardiology consulted - Echocardiogram showed echodense mass seen in left atrium, not present on prior study in July 2025 - Continue IV heparin   A-fib - Not on anticoagulation as outpatient due to recurrent bleeding - Currently on IV heparin  as above - Bisoprolol   Hyperlipidemia - Lipitor  Hypothyroidism - Synthroid   CKD 3A - Stable  Acute cystitis, present on admission - She was started on Cipro  as outpatient.  Currently on Rocephin  in the hospital  Left hydroureteronephrosis - CT:  Mild left hydronephrosis and hydroureter. No obstructing stone. Underlying urothelial lesion at the level of the left ureterovesical junction as the cause of obstruction is not excluded. Further evaluation with cystoscopy is recommended. Thickened and trabeculated appearance of the bladder wall may be related to chronic bladder dysfunction. An infiltrative process is not excluded. Cystoscopy may provide better evaluation. - Patient followed by Dr. Selma, [facility]  Lung nodule, history of non-small cell lung cancer - Followed by Dr. Gatha - Possible LUL nodule noted on CT in the ED.  She will need close outpatient follow-up   DVT prophylaxis: IV heparin    Code Status: Full code Family Communication: Daughter at bedside Disposition Plan: Home Status is: Inpatient Remains inpatient appropriate because: IV heparin     Antimicrobials:  Anti-infectives (From admission, onward)    Start     Dose/Rate Route Frequency Ordered Stop   09/09/24 1800  cefTRIAXone  (ROCEPHIN ) 1 g  in sodium chloride  0.9 % 100 mL IVPB        1 g 200 mL/hr over 30 Minutes Intravenous Every 24 hours 09/09/24 1756          Objective: Vitals:   09/10/24 0800 09/10/24 1000 09/10/24 1040 09/10/24 1419  BP: 109/67 120/74  126/89  Pulse: 65 65  75   Resp: 17 (!) 24  18  Temp:   98.8 F (37.1 C) 98.7 F (37.1 C)  TempSrc:   Oral Oral  SpO2: 96% 96%  100%  Weight:       No intake or output data in the 24 hours ending 09/10/24 1506 Filed Weights   09/09/24 1533  Weight: 54 kg    Examination:  General exam: Appears calm and comfortable  Respiratory system: Clear to auscultation. Respiratory effort normal. No respiratory distress. No conversational dyspnea.  Cardiovascular system: S1 & S2 heard, Irreg rhythm. No murmurs. No pedal edema. Gastrointestinal system: Abdomen is nondistended, soft and nontender. Normal bowel sounds heard. Central nervous system: Alert and oriented. Some numbness to touch left forearm compared to right, 4-5 strength LUE, 5/5 strength RUE. Both arms are warm to touch  Extremities: Symmetric in appearance  Skin: No rashes, lesions or ulcers on exposed skin  Psychiatry: Judgement and insight appear normal. Mood & affect appropriate.   Data Reviewed: I have personally reviewed following labs and imaging studies  CBC: Recent Labs  Lab 09/09/24 1520 09/09/24 1526 09/09/24 1554 09/10/24 0336  WBC 7.7  --   --  6.5  NEUTROABS 6.1  --   --   --   HGB 11.9* 12.6 9.9* 10.6*  HCT 37.6 37.0 29.0* 33.5*  MCV 96.7  --   --  97.4  PLT 252  --   --  191   Basic Metabolic Panel: Recent Labs  Lab 09/09/24 1520 09/09/24 1526 09/09/24 1554 09/10/24 0336  NA 143 144 139 142  K 4.2 4.1 4.3 4.0  CL 110 109 104 109  CO2 21*  --   --  25  GLUCOSE 116* 113* 97 80  BUN 21 22 24* 18  CREATININE 1.56* 1.70* 1.70* 1.47*  CALCIUM  9.7  --   --  9.3   GFR: Estimated Creatinine Clearance: 26.5 mL/min (A) (by C-G formula based on SCr of 1.47 mg/dL (H)). Liver Function Tests: Recent Labs  Lab 09/09/24 1520 09/10/24 0336  AST 14* 12*  ALT 6 5  ALKPHOS 81 71  BILITOT 0.3 0.3  PROT 6.8 6.3*  ALBUMIN 3.9 3.6   No results for input(s): LIPASE, AMYLASE in the last 168 hours. No results for input(s):  AMMONIA in the last 168 hours. Coagulation Profile: Recent Labs  Lab 09/09/24 1520  INR 1.0   Cardiac Enzymes: No results for input(s): CKTOTAL, CKMB, CKMBINDEX, TROPONINI in the last 168 hours. BNP (last 3 results) No results for input(s): PROBNP in the last 8760 hours. HbA1C: Recent Labs    09/10/24 0336  HGBA1C 5.5   CBG: No results for input(s): GLUCAP in the last 168 hours. Lipid Profile: Recent Labs    09/10/24 0336  CHOL 138  HDL 56  LDLCALC 68  TRIG 70  CHOLHDL 2.5   Thyroid  Function Tests: No results for input(s): TSH, T4TOTAL, FREET4, T3FREE, THYROIDAB in the last 72 hours. Anemia Panel: No results for input(s): VITAMINB12, FOLATE, FERRITIN, TIBC, IRON, RETICCTPCT in the last 72 hours. Sepsis Labs: No results for input(s): PROCALCITON, LATICACIDVEN in the last 168 hours.  No results found for this or any previous visit (from the past 240 hours).    Radiology Studies: MR CERVICAL SPINE WO CONTRAST Result Date: 09/10/2024 EXAM: MRI CERVICAL SPINE WITHOUT CONTRAST 09/10/2024 12:18:00 PM TECHNIQUE: Multiplanar multisequence MRI of the cervical spine was performed. COMPARISON: Cervical spine CT 12/01/2016. CTA head and neck yesterday. CLINICAL HISTORY: 79 year old female with left upper extremity pain, numbness, and weakness. Acute cervical spine myelopathy. FINDINGS: BONES AND ALIGNMENT: Mild cervicothoracic scoliosis. Straightening of cervical lordosis with multilevel mild degenerative appearing anterolisthesis, most pronounced at C4-C5. Normal vertebral body heights. Background marrow signal is normal. No marrow edema identified. Widespread chronic cervical facet arthropathy. SPINAL CORD: Normal spinal cord size. No abnormal spinal cord signal. SOFT TISSUES: No paraspinal mass. Preserved major vascular flow voids in the bilateral neck with tortuous vertebral arteries. Negative visible neck soft tissues and thoracic inlet.  Negative cervicomedullary junction and visible cerebellum. Patchy T2 hyperintensity in the brainstem, most commonly due to chronic small vessel disease. DEGENERATIVE: C2-C3: Moderate facet hypertrophy greater on the right, moderate ligamentum flavum hypertrophy. Mild disc bulging and endplate spurring. Mild spinal stenosis. No cord mass effect. Moderate left C3 neural foraminal stenosis. C3-C4: Moderate to severe facet and ligamentum flavum hypertrophy greater on the left. Mild disc bulging and endplate spurring. Mild spinal stenosis. No spinal cord mass effect. Tortuosity of the left vertebral artery in the left C4 neural foramen, moderate superimposed left foraminal stenosis. C4-C5: Mild anterolisthesis. Circumferential disc bulging, pseudodisc. Mild endplate spurring. Severe facet and ligamentum flavum hypertrophy bilaterally. Mild spinal stenosis. No cord mass effect (series 10 image 25). Tortuous vertebral arteries in the C5 neural foramina here. Moderate bilateral foraminal stenosis suspected. C5-C6: Circumferential disc bulging and endplate spurring. Moderate facet and mild ligamentum flavum hypertrophy. No spinal stenosis. Mild C6 neural foraminal stenosis. C6-C7: Disc space loss. Circumferential disc osteophyte complex asymmetric to the right. Mild posterior element hypertrophy. No spinal stenosis. Mild to moderate right C7 foraminal stenosis. C7-T1: Disc space loss. Circumferential disc osteophyte complex. Mild to moderate facet and ligamentum flavum hypertrophy. No spinal stenosis. Mild right C8 foraminal stenosis. No visible upper thoracic spinal stenosis. IMPRESSION: 1. Widespread chronic cervical degeneration. Mild multifactorial spinal stenosis from C2-C3 through C4-C5. No spinal cord mass effect or signal abnormality. 2. Bulky cervical facet degeneration, maximal at C4-C5 where this is mild anterolisthesis. Up to moderate associated degenerative cervical foraminal stenosis, with superimposed  vertebral artery tortuosity in several of the cervical neural foramina. Electronically signed by: Helayne Hurst MD 09/10/2024 12:38 PM EST RP Workstation: [id number]   ECHOCARDIOGRAM COMPLETE BUBBLE STUDY Result Date: 09/10/2024    ECHOCARDIOGRAM REPORT   Patient Name:   MAKILA COLOMBE Date of Exam: 09/10/2024 Medical Rec #:  [id number]       Height:       66.0 in Accession #:    [id number]      Weight:       119.0 lb Date of Birth:  Mar 10, 1946       BSA:          1.604 m Patient Age:    78 years        BP:           134/91 mmHg Patient Gender: F               HR:           73 bpm. Exam Location:  Inpatient Procedure: 2D Echo, Cardiac Doppler, Color Doppler, Saline Contrast Bubble Study  and Intracardiac Opacification Agent (Both Spectral and Color Flow            Doppler were utilized during procedure). Indications:    Atrial thrombus  History:        Patient has prior history of Echocardiogram examinations, most                 recent 03/18/2024.  Sonographer:    Odella Brewster Referring Phys: [id number] TIMOTHY S OPYD  Sonographer Comments: Image acquisition challenging due to respiratory motion. IMPRESSIONS  1. Echodense mass ( 1.35 cm x 2.29 cm) seen in the left atrium suspected to be a left atrial thrombus, this is not present on prior study on 03/18/2024. May benefiit from a cardiac MR. Left atrial size was severely dilated.  2. Left ventricular ejection fraction, by estimation, is 60 to 65%. The left ventricle has normal function. The left ventricle has no regional wall motion abnormalities. There is mild asymmetric left ventricular hypertrophy of the septal segment. Left ventricular diastolic parameters were normal.  3. Right ventricular systolic function is normal. The right ventricular size is normal.  4. Right atrial size was severely dilated.  5. The mitral valve is normal in structure. Mild to moderate mitral valve regurgitation. No evidence of mitral stenosis.  6. Tricuspid valve regurgitation is  mild to moderate.  7. The aortic valve is calcified. There is mild calcification of the aortic valve. There is mild thickening of the aortic valve. Aortic valve regurgitation is not visualized. Aortic valve sclerosis/calcification is present, without any evidence of aortic stenosis.  8. The inferior vena cava is normal in size with greater than 50% respiratory variability, suggesting right atrial pressure of 3 mmHg.  9. Agitated saline contrast bubble study was negative, with no evidence of any interatrial shunt. FINDINGS  Left Ventricle: Left ventricular ejection fraction, by estimation, is 60 to 65%. The left ventricle has normal function. The left ventricle has no regional wall motion abnormalities. Definity  contrast agent was given IV to delineate the left ventricular  endocardial borders. The left ventricular internal cavity size was normal in size. There is mild asymmetric left ventricular hypertrophy of the septal segment. Left ventricular diastolic parameters were normal. Right Ventricle: The right ventricular size is normal. No increase in right ventricular wall thickness. Right ventricular systolic function is normal. Left Atrium: Echodense mass ( 1.35 cm x 2.29 cm) seen in the left atrium suspected to be a left atrial thrombus, this is not present on prior study on 03/18/2024. May benefiit from a cardiac MR. Left atrial size was severely dilated. Right Atrium: Right atrial size was severely dilated. Pericardium: There is no evidence of pericardial effusion. Mitral Valve: The mitral valve is normal in structure. Mild to moderate mitral valve regurgitation. No evidence of mitral valve stenosis. MV peak gradient, 7.4 mmHg. The mean mitral valve gradient is 2.0 mmHg. Tricuspid Valve: The tricuspid valve is normal in structure. Tricuspid valve regurgitation is mild to moderate. No evidence of tricuspid stenosis. Aortic Valve: The aortic valve is calcified. There is mild calcification of the aortic valve. There  is mild thickening of the aortic valve. There is moderate aortic valve annular calcification. Aortic valve regurgitation is not visualized. Aortic valve sclerosis/calcification is present, without any evidence of aortic stenosis. Aortic valve mean gradient measures 3.0 mmHg. Aortic valve peak gradient measures 5.6 mmHg. Aortic valve area, by VTI measures 2.91 cm. Pulmonic Valve: The pulmonic valve was normal in structure. Pulmonic valve regurgitation is not visualized. No evidence  of pulmonic stenosis. Aorta: The aortic root is normal in size and structure. Venous: The inferior vena cava is normal in size with greater than 50% respiratory variability, suggesting right atrial pressure of 3 mmHg. IAS/Shunts: No atrial level shunt detected by color flow Doppler. Agitated saline contrast was given intravenously to evaluate for intracardiac shunting. Agitated saline contrast bubble study was negative, with no evidence of any interatrial shunt.  LEFT VENTRICLE PLAX 2D LVIDd:         3.10 cm     Diastology LVIDs:         2.20 cm     LV e' medial:    7.72 cm/s LV PW:         0.80 cm     LV E/e' medial:  17.9 LV IVS:        1.00 cm     LV e' lateral:   7.51 cm/s LVOT diam:     2.20 cm     LV E/e' lateral: 18.4 LV SV:         63 LV SV Index:   40 LVOT Area:     3.80 cm LV IVRT:       74 msec  LV Volumes (MOD) LV vol d, MOD A2C: 52.4 ml LV vol d, MOD A4C: 45.3 ml LV vol s, MOD A2C: 13.0 ml LV vol s, MOD A4C: 12.5 ml LV SV MOD A2C:     39.4 ml LV SV MOD A4C:     45.3 ml LV SV MOD BP:      36.9 ml RIGHT VENTRICLE             IVC RV S prime:     10.00 cm/s  IVC diam: 1.60 cm TAPSE (M-mode): 1.9 cm                             PULMONARY VEINS                             Diastolic Velocity: 47.20 cm/s                             S/D Velocity:       0.90                             Systolic Velocity:  42.10 cm/s LEFT ATRIUM              Index        RIGHT ATRIUM           Index LA diam:        4.50 cm  2.81 cm/m   RA Area:      28.10 cm LA Vol (A2C):   108.0 ml 67.34 ml/m  RA Volume:   93.10 ml  58.05 ml/m LA Vol (A4C):   134.0 ml 83.55 ml/m LA Biplane Vol: 120.0 ml 74.82 ml/m  AORTIC VALVE                    PULMONIC VALVE AV Area (Vmax):    2.66 cm     PV Vmax:          0.90 m/s AV Area (Vmean):   2.63 cm     PV Peak grad:     3.3  mmHg AV Area (VTI):     2.91 cm     PR End Diast Vel: 9.86 msec AV Vmax:           118.00 cm/s AV Vmean:          80.400 cm/s AV VTI:            0.218 m AV Peak Grad:      5.6 mmHg AV Mean Grad:      3.0 mmHg LVOT Vmax:         82.50 cm/s LVOT Vmean:        55.600 cm/s LVOT VTI:          0.167 m LVOT/AV VTI ratio: 0.77  AORTA Ao Root diam: 3.70 cm MITRAL VALVE                TRICUSPID VALVE MV Area (PHT): 4.44 cm     TR Peak grad:   36.5 mmHg MV Area VTI:   2.56 cm     TR Vmax:        302.00 cm/s MV Peak grad:  7.4 mmHg MV Mean grad:  2.0 mmHg     SHUNTS MV Vmax:       1.36 m/s     Systemic VTI:  0.17 m MV Vmean:      59.3 cm/s    Systemic Diam: 2.20 cm MV Decel Time: 171 msec MV E velocity: 138.00 cm/s MV A velocity: 42.10 cm/s MV E/A ratio:  3.28 Kardie Tobb DO Electronically signed by Dub Huntsman DO Signature Date/Time: 09/10/2024/10:36:22 AM    Final    MR BRAIN WO CONTRAST Result Date: 09/09/2024 EXAM: MRI BRAIN WITHOUT CONTRAST 09/09/2024 06:49:00 PM TECHNIQUE: Multiplanar multisequence MRI of the head/brain was performed without the administration of intravenous contrast. COMPARISON: Head CT and CTA 09/09/2024 and MRI 12/13/2022. CLINICAL HISTORY: Neuro deficit, acute, stroke suspected. Left arm pain and numbness. FINDINGS: BRAIN AND VENTRICLES: There is no evidence of an acute infarct, intracranial hemorrhage, mass, midline shift, hydrocephalus, or extra-axial fluid collection. There is mild cerebral atrophy. T2 hyperintensities in the cerebral white matter and pons are similar to the prior MRI and nonspecific but compatible with mild chronic small vessel ischemic disease. Major intracranial  vascular flow voids are preserved. Small right ICA aneurysm, evaluated in greater detail on today's earlier CTA. ORBITS: Bilateral cataract extraction. SINUSES AND MASTOIDS: No significant abnormality. BONES AND SOFT TISSUES: Small left frontal scalp lipoma. IMPRESSION: 1. No acute intracranial abnormality. 2. Mild chronic small vessel ischemic disease. Electronically signed by: Dasie Hamburg MD 09/09/2024 07:18 PM EST RP Workstation: [id number]   CT ANGIO CODE STROKE CHEST/ABD/PEL FOR DISSECTION W &/OR WO CONTRAST Result Date: 09/09/2024 CLINICAL DATA:  Neurologic deficit. Concern for aortic dissection. Weakness. EXAM: CT ANGIOGRAPHY CHEST, ABDOMEN AND PELVIS TECHNIQUE: Non-contrast CT of the chest was initially obtained. Multidetector CT imaging through the chest, abdomen and pelvis was performed using the standard protocol during bolus administration of intravenous contrast. Multiplanar reconstructed images and MIPs were obtained and reviewed to evaluate the vascular anatomy. RADIATION DOSE REDUCTION: This exam was performed according to the departmental dose-optimization program which includes automated exposure control, adjustment of the mA and/or kV according to patient size and/or use of iterative reconstruction technique. CONTRAST:  OMNIPAQUE  IOHEXOL  350 MG/ML SOLN COMPARISON:  Chest CT dated 05/28/2024. FINDINGS: CTA CHEST FINDINGS Cardiovascular: There is mild cardiomegaly with biatrial dilatation. A 14 x 7 mm lesion in the left atrium is not characterized on this CT  but may represent a blood clot, or a primary cardiac tumor such as myxoma. Further evaluation with echocardiogram or MRI without and with contrast recommended. No pericardial effusion. There is coronary vascular calcification. There is moderate atherosclerotic calcification of the thoracic aorta. No aneurysmal dilatation or dissection. The origins of the great vessels of the aortic arch are patent. No pulmonary artery embolus  identified. Mediastinum/Nodes: No hilar or mediastinal adenopathy. There is a moderate size hiatal hernia containing a portion of the spine. The esophagus is grossly unremarkable. No mediastinal fluid collection. Lungs/Pleura: Stable area of consolidation in the right middle lobe, likely scarring. Similar appearance of a cluster of nodularity in the left upper lobe with the largest nodule measuring up to 6 mm. Several additional faint ground-glass nodules in the left lower lobe measure up to 6 mm and similar to prior CT. Additional ground-glass nodule in the right upper lobe measure up to 5 mm and also similar to prior CT. There is trace right pleural effusion. No new consolidation. No pneumothorax. The central airways are patent. Musculoskeletal: Osteopenia with degenerative changes of the spine. No acute osseous pathology. Review of the MIP images confirms the above findings. CTA ABDOMEN AND PELVIS FINDINGS VASCULAR Aorta: Advanced atherosclerotic calcification of the abdominal aorta. No aneurysmal dilatation or dissection. Celiac: The celiac trunk and its major branches are patent. SMA: The SMA is patent. Renals: Atherosclerotic calcification of the left renal artery. 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 The left renal artery remains patent. IMA: The IMA is patent. Inflow: Moderate atherosclerotic calcification of the iliac arteries. No aneurysmal dilatation or dissection. The iliac arteries are patent. Veins: No obvious venous abnormality within the limitations of this arterial phase study. Review of the MIP images confirms the above findings. NON-VASCULAR No intra-abdominal free air or free fluid. Hepatobiliary: Subcentimeter hepatic hypodense lesions are too small to characterize. Cholecystectomy. Dilatation of the common bile duct measuring 16 mm in diameter. No retained calcified stone noted in the central CBD. Pancreas: Unremarkable. No pancreatic ductal dilatation or surrounding  inflammatory changes. Spleen: Normal in size without focal abnormality. Adrenals/Urinary Tract: The adrenal glands unremarkable. Solitary left kidney. There is mild left hydronephrosis. Extrarenal left pelvis with mild pelviectasis. There is mild left hydroureter. No obstructing stone. Underlying urothelial lesion at the level of the left ureterovesical junction as the cause of obstruction is not excluded. The urinary bladder is minimally distended. There is thickened and trabeculated appearance of the bladder wall which may be related to chronic bladder dysfunction. An infiltrative process is not excluded. Cystoscopy may provide better evaluation. There is perivesical stranding. Correlation with urinalysis recommended to exclude cystitis. Stomach/Bowel: There is no bowel obstruction or active inflammation. The appendix is not visualized with certainty. No inflammatory changes identified in the right lower quadrant. Lymphatic: No adenopathy. Reproductive: Hysterectomy. Other: None Musculoskeletal: Osteopenia with degenerative changes of the spine. Lower lumbar posterior fusion. Total right hip arthroplasty. No acute osseous pathology. Review of the MIP images confirms the above findings. IMPRESSION: 1. No aortic dissection or aneurysm. 2. A 14 x 7 mm lesion in the left atrium may represent a blood clot, or a primary cardiac tumor such as myxoma. Further evaluation with echocardiogram or MRI without and with contrast recommended. 3. Mild left hydronephrosis and hydroureter. No obstructing stone. Underlying urothelial lesion at the level of the left ureterovesical junction as the cause of obstruction is not excluded. Further evaluation with cystoscopy is recommended. 4. Thickened and trabeculated appearance  of the bladder wall may be related to chronic bladder dysfunction. An infiltrative process is not excluded. Cystoscopy may provide better evaluation. 5. Perivesical stranding. Correlation with urinalysis  recommended to exclude cystitis. 6. 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 7. No bowel obstruction. 8.  Aortic Atherosclerosis (ICD10-I70.0). Electronically Signed   By: Vanetta Chou M.D.   On: 09/09/2024 16:14   CT ANGIO HEAD NECK W WO CM Result Date: 09/09/2024 EXAM: CTA HEAD AND NECK WITH AND WITHOUT 09/09/2024 03:43:00 PM TECHNIQUE: CTA of the head and neck was performed with and without the administration of 125 mL of intravenous contrast (iohexol  (OMNIPAQUE ) 350 MG/ML injection 150 mL IOHEXOL  350 MG/ML SOLN). Multiplanar 2D and/or 3D reformatted images are provided for review. Automated exposure control, iterative reconstruction, and/or weight based adjustment of the mA/kV was utilized to reduce the radiation dose to as low as reasonably achievable. Stenosis of the internal carotid arteries measured using NASCET criteria. COMPARISON: CT head dated 09/09/2024 03:43:00 PM CLINICAL HISTORY: Stroke/TIA, determine embolic source. FINDINGS: CTA NECK: AORTIC ARCH AND ARCH VESSELS: Atherosclerosis of the visualized aortic arch and aortic arch vessel origins. Mild stenosis at the origin of the left subclavian artery. No dissection or arterial injury. CERVICAL CAROTID ARTERIES: Mild atherosclerosis of the right carotid bifurcation without hemodynamically significant stenosis of the mid right cervical ICA. Mild atherosclerosis along the mid left common carotid artery and at the left carotid bifurcation without hemodynamically significant stenosis. Prominent tortuosity of the mid and distal left cervical ICA. Multiple areas of irregularity with subtle narrowing and outpouching of the left cervical ICA which may reflect fibromuscular dysplasia. No dissection or arterial injury. CERVICAL VERTEBRAL ARTERIES: Tortuosity of the V1 segment of the right vertebral artery. No dissection, arterial injury, or significant stenosis. LUNGS AND MEDIASTINUM: Incompletely visualized 1.0 cm nodule in the anterior left upper  lobe seen on series 6 image 13. The remainder of the visualized lungs and mediastinum are unremarkable. SOFT TISSUES: Edentulous maxilla and mandible. Degenerative changes of the left temporomandibular joint. No acute abnormality. BONES: Degenerative changes throughout the visualized spine. Disc space narrowing is greatest at C6-C7. No acute abnormality. CTA HEAD: ANTERIOR CIRCULATION: Atherosclerosis of the carotid siphons without significant stenosis. There is a 5 x 4 x 5 mm laterally and slightly posteriorly directed outpouching along the anterior genia of the right cavernous ICA/right paraophthalmic ICA (series 9 image 105). There is an additional 1.5 mm superiorly directed outpouching along the right paraophthalmic ICA (series 9 image 101). 2.5 mm inferiorly directed outpouching along the left supraclinoid ICA which may reflect an infundibulum at the posterior communicating artery origin versus small aneurysm. No significant stenosis of the anterior cerebral arteries. No significant stenosis of the middle cerebral arteries. POSTERIOR CIRCULATION: Fetal origin of the right PCA. No significant stenosis of the posterior cerebral arteries. No significant stenosis of the basilar artery. No significant stenosis of the vertebral arteries. No aneurysm. OTHER: No dural venous sinus thrombosis on this non-dedicated study. IMPRESSION: 1. No acute large vessel occlusion. 2.  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 3. 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 4. 2.5 mm outpouching along the left supraclinoid ICA, possibly representing an infundibulum versus small aneurysm. 5. Prominent tortuosity of the mid and distal left cervical ICA with multiple areas of irregularity, subtle narrowing, and outpouching, possibly reflecting fibromuscular dysplasia. 6. Mild atherosclerosis as above. 7. Incompletely visualized 1.0 cm left upper lobe pulmonary nodule, with prompt  non-contrast chest CT for full evaluation recommended per Fleischner Society Guidelines. Electronically signed by: Donnice Mania MD 09/09/2024 04:13 PM EST RP Workstation: [id number]   CT HEAD CODE STROKE WO CONTRAST (LKW 0-4.5h, LVO 0-24h) Result Date: 09/09/2024 EXAM: CT HEAD WITHOUT CONTRAST 09/09/2024 03:30:49 PM TECHNIQUE: CT of the head was performed without the administration of intravenous contrast. Automated exposure control, iterative reconstruction, and/or weight based adjustment of the mA/kV was utilized to reduce the radiation dose to as low as reasonably achievable. COMPARISON: Head CT and MRI 12/13/2022. CLINICAL HISTORY: Neuro deficit, acute, stroke suspected. Left arm pain and weakness. FINDINGS: BRAIN AND VENTRICLES: There is no evidence of an acute infarct, intracranial hemorrhage, mass, midline shift, hydrocephalus, or extra-axial fluid collection. There is mild cerebral atrophy. Cerebral white matter hypodensities are similar to the prior CT and nonspecific but compatible with mild chronic small vessel ischemic disease. Calcified atherosclerosis at the skull base. ORBITS: Bilateral cataract extraction. SINUSES: No acute abnormality. SOFT TISSUES AND SKULL: Small left frontal scalp lipoma. No skull fracture. [place] Stroke Program Early CT Score (ASPECTS) ----- Ganglionic (caudate, IC, lentiform nucleus, insula, M1-M3): 7 Supraganglionic (M4-M6): 3 Total: 10 IMPRESSION: 1. No acute intracranial abnormality. ASPECTS of 10. 2. Mild chronic small vessel ischemic disease. 3. These results were communicated to Dr. FREDRIK Hock at 3:34pm on 09/09/2024 by secure text page via the [facility] messaging system. Electronically signed by: Dasie Hamburg MD 09/09/2024 03:35 PM EST RP Workstation: [id number]      Scheduled Meds:  atorvastatin   10 mg Oral QHS   bisoprolol   2.5 mg Oral Daily   buPROPion   75 mg Oral q morning   FLUoxetine   20 mg Oral QHS   gabapentin   600 mg Oral BID   levothyroxine   88 mcg  Oral Q0600   pantoprazole   40 mg Oral BID AC   Continuous Infusions:  sodium chloride  40 mL/hr at 09/10/24 0701   cefTRIAXone  (ROCEPHIN )  IV Stopped (09/09/24 2310)   heparin  950 Units/hr (09/09/24 1746)     LOS: 0 days   Time spent: 40 minutes   Delon Hoe, DO [facility] 09/10/2024, 3:06 PM   Available via Epic secure chat 7am-7pm After these hours, please refer to coverage provider listed on [email or web address]  "

## 2024-09-10 NOTE — Consult Note (Addendum)
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 hydroureter. No obstructing stone. Underlying urothelial lesion at the level of the left ureterovesical junction as the cause of obstruction is not excluded. Further evaluation with cystoscopy is recommended. 4. Thickened and trabeculated appearance of the bladder wall may be related to chronic bladder dysfunction. An infiltrative process is not excluded. Cystoscopy may provide better evaluation. 5. Perivesical stranding. Correlation with urinalysis recommended to exclude cystitis. 6. 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 7. No bowel obstruction. 8.  Aortic Atherosclerosis (ICD10-I70.0). Electronically Signed   By: Vanetta Chou M.D.   On: 09/09/2024 16:14   CT ANGIO HEAD NECK W WO CM Result Date: 09/09/2024 EXAM: CTA HEAD AND NECK WITH AND WITHOUT 09/09/2024 03:43:00 PM TECHNIQUE: CTA of the head and neck was performed with and without the administration of 125 mL of intravenous contrast (iohexol  (OMNIPAQUE ) 350 MG/ML injection 150 mL IOHEXOL  350 MG/ML SOLN). Multiplanar 2D  and/or 3D reformatted images are provided for review. Automated exposure control, iterative reconstruction, and/or weight based adjustment of the mA/kV was utilized to reduce the radiation dose to as low as reasonably achievable. Stenosis of the internal carotid arteries measured using NASCET criteria. COMPARISON: CT head dated 09/09/2024 03:43:00 PM CLINICAL HISTORY: Stroke/TIA, determine embolic source. FINDINGS: CTA NECK: AORTIC ARCH AND ARCH VESSELS: Atherosclerosis of the visualized aortic arch and aortic arch vessel origins. Mild stenosis at the origin of the left subclavian artery. No dissection or arterial injury. CERVICAL CAROTID ARTERIES: Mild atherosclerosis of the right carotid bifurcation without hemodynamically significant stenosis of the mid right cervical ICA. Mild atherosclerosis along the mid left common carotid artery and at the left carotid bifurcation without hemodynamically significant stenosis. Prominent tortuosity of the mid and distal left cervical ICA. Multiple areas of irregularity with subtle narrowing and outpouching of the left cervical ICA which may reflect fibromuscular dysplasia. No dissection or arterial injury. CERVICAL VERTEBRAL ARTERIES: Tortuosity of the V1 segment of the right vertebral artery. No dissection, arterial injury, or significant stenosis. LUNGS AND MEDIASTINUM: Incompletely visualized 1.0 cm nodule in the anterior left upper lobe seen on series 6 image 13. The remainder of the visualized lungs and mediastinum are unremarkable. SOFT TISSUES: Edentulous maxilla and mandible. Degenerative changes of the left temporomandibular joint. No acute abnormality. BONES: Degenerative changes throughout the visualized spine. Disc space narrowing is greatest at C6-C7. No acute abnormality. CTA HEAD: ANTERIOR CIRCULATION: Atherosclerosis of the carotid siphons without significant stenosis. There is a 5 x 4 x 5 mm laterally and slightly posteriorly directed outpouching along the  anterior genia of the right cavernous ICA/right paraophthalmic ICA (series 9 image 105). There is an additional 1.5 mm superiorly directed outpouching along the right paraophthalmic ICA (series 9 image 101). 2.5 mm inferiorly directed outpouching along the left supraclinoid ICA which may reflect an infundibulum at the posterior communicating artery origin  versus small aneurysm. No significant stenosis of the anterior cerebral arteries. No significant stenosis of the middle cerebral arteries. POSTERIOR CIRCULATION: Fetal origin of the right PCA. No significant stenosis of the posterior cerebral arteries. No significant stenosis of the basilar artery. No significant stenosis of the vertebral arteries. No aneurysm. OTHER: No dural venous sinus thrombosis on this non-dedicated study. IMPRESSION: 1. No acute large vessel occlusion. 2.  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 3. 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 4. 2.5 mm outpouching along the left supraclinoid ICA, possibly representing an infundibulum versus small aneurysm. 5. Prominent tortuosity of the mid and distal left cervical ICA with multiple areas of irregularity, subtle narrowing, and outpouching, possibly reflecting fibromuscular dysplasia. 6. Mild atherosclerosis as above. 7. Incompletely visualized 1.0 cm left upper lobe pulmonary nodule, with prompt non-contrast chest CT for full evaluation recommended per Fleischner Society Guidelines. Electronically signed by: Donnice Mania MD 09/09/2024 04:13 PM EST RP Workstation: [id number]   CT HEAD CODE STROKE WO CONTRAST (LKW 0-4.5h, LVO 0-24h) Result Date: 09/09/2024 EXAM: CT HEAD WITHOUT CONTRAST 09/09/2024 03:30:49 PM TECHNIQUE: CT of the head was performed without the administration of intravenous contrast. Automated exposure control, iterative reconstruction, and/or weight based adjustment of the mA/kV was utilized to reduce the radiation  dose to as low as reasonably achievable. COMPARISON: Head CT and MRI 12/13/2022. CLINICAL HISTORY: Neuro deficit, acute, stroke suspected. Left arm pain and weakness. FINDINGS: BRAIN AND VENTRICLES: There is no evidence of an acute infarct, intracranial hemorrhage, mass, midline shift, hydrocephalus, or extra-axial fluid collection. There is mild cerebral atrophy. Cerebral white matter hypodensities are similar to the prior CT and nonspecific but compatible with mild chronic small vessel ischemic disease. Calcified atherosclerosis at the skull base. ORBITS: Bilateral cataract extraction. SINUSES: No acute abnormality. SOFT TISSUES AND SKULL: Small left frontal scalp lipoma. No skull fracture. [place] Stroke Program Early CT Score (ASPECTS) ----- Ganglionic (caudate, IC, lentiform nucleus, insula, M1-M3): 7 Supraganglionic (M4-M6): 3 Total: 10 IMPRESSION: 1. No acute intracranial abnormality. ASPECTS of 10. 2. Mild chronic small vessel ischemic disease. 3. These results were communicated to Dr. FREDRIK Hock at 3:34pm on 09/09/2024 by secure text page via the [facility] messaging system. Electronically signed by: Dasie Hamburg MD 09/09/2024 03:35 PM EST RP Workstation: [id number]     Assessment and Plan:  [name] is a 79 y.o. female with a hx of hypertension, hyperlipidemia, hypothyroidism, history of CVA in 2014, non-small cell lung cancer stage Ia, CKD stage IIIa, anemia from GI bleed, Takotsubo cardiomyopathy, chronic HFpEF, and atrial fibrillation who is being seen 09/10/2024 for the evaluation of atrial fibrillation at the request of Delon Hoe.   Left upper extremity pain Patient presented to the hospital for left upper extremity pain, numbness, and weakness.  CT head and neck and brain MRI showed no acute abnormalities.  Given history of atrial fibrillation not on anticoagulation there are concerns of a left atrial thrombus/embolus because of this the patient was started on IV heparin  and vascular  surgery was consulted.  The patient was trialed on a lower dose of Eliquis  but was unable to tolerate this.   Persistent atrial fibrillation Hypertension CHA2DS2-VASc Score = 8 [CHF History: 1, HTN History: 1, Diabetes History: 0, Stroke History: 2, Vascular Disease History: 1, Age Score: 2, Gender Score: 1].  Therefore, the patient's annual risk of stroke is 10.8 %.    Patient was initially diagnosed with atrial fibrillation on 06/2017.  She  was started on anticoagulation but has recently been unable to tolerate due to GI bleed and anemia.  The patient was also considered for a Watchman procedure but at the last visit Dr. Cindie felt like she was not a candidate for any invasive procedures for her atrial fibrillation.  On telemetry patient was in atrial fibrillation with a ventricular rate in the 70s. Labs showed anemia with a hemoglobin of 9.9. Continue bisoprolol  2.5 mg daily Continue IV heparin    Anemia History of urinary bleed. Continue to monitor hemoglobin.  Patient has been unable to tolerate Eliquis  in the past.      Risk Assessment/Risk Scores:       CHA2DS2-VASc Score = 8   This indicates a 10.8% annual risk of stroke. The patient's score is based upon: CHF History: 1 HTN History: 1 Diabetes History: 0 Stroke History: 2 Vascular Disease History: 1 Age Score: 2 Gender Score: 1        For questions or updates, please contact [facility] Please consult [email or web address] for contact info under     Signed, Morse Clause, PA-C  09/10/2024 9:19 AM     [1]  Allergies Allergen Reactions   Eliquis  [Apixaban ] Other (See Comments)    Caused blood in the urine   Penicillins Rash   "
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
## 2024-09-11 DIAGNOSIS — I4729 Other ventricular tachycardia: Secondary | ICD-10-CM

## 2024-09-11 DIAGNOSIS — R200 Anesthesia of skin: Secondary | ICD-10-CM | POA: Diagnosis not present

## 2024-09-11 LAB — BASIC METABOLIC PANEL WITH GFR
Anion gap: 10 (ref 5–15)
BUN: 16 mg/dL (ref 8–23)
CO2: 23 mmol/L (ref 22–32)
Calcium: 8.9 mg/dL (ref 8.9–10.3)
Chloride: 108 mmol/L (ref 98–111)
Creatinine, Ser: 1.56 mg/dL — ABNORMAL HIGH (ref 0.44–1.00)
GFR, Estimated: 33 mL/min — ABNORMAL LOW
Glucose, Bld: 118 mg/dL — ABNORMAL HIGH (ref 70–99)
Potassium: 3.6 mmol/L (ref 3.5–5.1)
Sodium: 141 mmol/L (ref 135–145)

## 2024-09-11 LAB — CBC
HCT: 31.1 % — ABNORMAL LOW (ref 36.0–46.0)
Hemoglobin: 9.9 g/dL — ABNORMAL LOW (ref 12.0–15.0)
MCH: 30.7 pg (ref 26.0–34.0)
MCHC: 31.8 g/dL (ref 30.0–36.0)
MCV: 96.6 fL (ref 80.0–100.0)
Platelets: 181 K/uL (ref 150–400)
RBC: 3.22 MIL/uL — ABNORMAL LOW (ref 3.87–5.11)
RDW: 13.9 % (ref 11.5–15.5)
WBC: 5 K/uL (ref 4.0–10.5)
nRBC: 0 % (ref 0.0–0.2)

## 2024-09-11 LAB — MAGNESIUM: Magnesium: 1.5 mg/dL — ABNORMAL LOW (ref 1.7–2.4)

## 2024-09-11 LAB — HEPARIN LEVEL (UNFRACTIONATED): Heparin Unfractionated: 0.33 [IU]/mL (ref 0.30–0.70)

## 2024-09-11 MED ORDER — POTASSIUM CHLORIDE CRYS ER 20 MEQ PO TBCR
40.0000 meq | EXTENDED_RELEASE_TABLET | Freq: Once | ORAL | Status: AC
  Administered 2024-09-11: 40 meq via ORAL
  Filled 2024-09-11: qty 2

## 2024-09-11 MED ORDER — MAGNESIUM SULFATE 4 GM/100ML IV SOLN
4.0000 g | Freq: Once | INTRAVENOUS | Status: AC
  Administered 2024-09-11: 4 g via INTRAVENOUS
  Filled 2024-09-11: qty 100

## 2024-09-11 NOTE — TOC Initial Note (Signed)
 Transition of Care [facility]) - Initial/Assessment Note    Patient Details  Name: [name] MRN: [id number] Date of Birth: [birth year]  Transition of Care [facility]) CM/SW Contact:    Katie JULIANNA George, RN Phone Number: 09/11/2024, 10:50 AM  Clinical Narrative:                  [name] is a 79 y.o. female with medical history significant for atrial fibrillation no longer anticoagulated due to bleeding, anxiety, hypertension, hyperlipidemia, hypothyroidism, and COPD, now presenting with pain, numbness, and weakness involving the left arm.  Pt is from home with her spouse. They are together all the time. Pt says spouse is able to assist her as needed.  Spouse provides needed transportation.  Pt and spouse manage her meds. She denies issues.   Home health arranged with [facility]. Information on the AVS. [name] will contact her for the first home visit.  ICM following for further dc needs.   Expected Discharge Plan: Home w Home Health Services Barriers to Discharge: Continued Medical Work up   Patient Goals and CMS Choice   CMS Medicare.gov Compare Post Acute Care list provided to:: Patient Choice offered to / list presented to : Patient      Expected Discharge Plan and Services   Discharge Planning Services: CM Consult Post Acute Care Choice: Home Health Living arrangements for the past 2 months: Apartment                           HH Arranged: PT, OT HH Agency: [facility] Date [facility] Contacted: 09/11/24   Representative spoke with at [facility] Agency: [name]  Prior Living Arrangements/Services Living arrangements for the past 2 months: Apartment Lives with:: Spouse Patient language and need for interpreter reviewed:: Yes Do you feel safe going back to the place where you live?: Yes        Care giver support system in place?: Yes (comment) Current home services: DME (cane/ walker/ shower seat/ BSC/ wheelchair) Criminal Activity/Legal Involvement Pertinent to  Current Situation/Hospitalization: No - Comment as needed  Activities of Daily Living      Permission Sought/Granted                  Emotional Assessment Appearance:: Appears stated age Attitude/Demeanor/Rapport: Engaged Affect (typically observed): Accepting Orientation: : Oriented to Self, Oriented to Place, Oriented to Situation, Oriented to  Time   Psych Involvement: No (comment)  Admission diagnosis:  Left upper extremity numbness [R20.0] Left atrial thrombus [I51.3] Numbness of left hand [R20.0] Patient Active Problem List   Diagnosis Date Noted   NSVT (nonsustained ventricular tachycardia) (HCC) 09/11/2024   Left atrial mass 09/10/2024   Left upper extremity numbness 09/09/2024   CKD stage 3a, GFR 45-59 ml/min (HCC) 09/09/2024   Atrial thrombus 09/09/2024   Hydronephrosis of left kidney 09/09/2024   Bladder wall thickening 09/09/2024   Hypophosphatemia 03/19/2024   Acute blood loss anemia 03/19/2024   Anemia of chronic disease 03/19/2024   Shock (HCC) 03/18/2024   Acute hypoxemic respiratory failure (HCC) 01/25/2024   Pyelonephritis 01/02/2024   Acute renal failure superimposed on stage 3a chronic kidney disease (HCC) 01/02/2024   COPD (chronic obstructive pulmonary disease) (HCC)    Gastritis and gastroduodenitis 07/12/2023   Dilated pancreatic duct 07/12/2023   Dilation of biliary tract 07/03/2023   Acute cystitis 05/22/2023   Abdominal pain 05/22/2023   History of CVA (cerebrovascular accident) 05/22/2023  GAD (generalized anxiety disorder) 05/22/2023   Lung cancer (HCC) 03/01/2023   Lung nodule 02/15/2023   Clostridioides difficile infection 12/11/2022   Malnutrition of moderate degree 12/08/2022   Intractable nausea and vomiting 12/07/2022   Hyponatremia 12/07/2022   Acute urinary retention 06/18/2022   Acute metabolic encephalopathy 06/17/2022   Pyuria 06/17/2022   GERD (gastroesophageal reflux disease) 06/17/2022   Hyperglycemia 06/17/2022    Low back pain 05/28/2020   Dilation of pancreatic duct 05/11/2020   Anterior dislocation of right shoulder 06/17/2019   Shoulder pain, right 06/17/2019   Duodenitis 02/24/2019   Chronic pain syndrome 02/24/2019   Debility 02/13/2019   Nausea and vomiting    Aspiration pneumonia (HCC)    Diarrhea    Pressure injury of skin 02/08/2019   Pneumonia of right lower lobe due to methicillin susceptible Staphylococcus aureus (MSSA) (HCC)    Volume overload state of heart    Acute encephalopathy 02/01/2019   Acute respiratory failure with hypoxia (HCC) 02/01/2019   Hypokalemia 02/01/2019   Hypomagnesemia 02/01/2019   Leukocytosis 02/01/2019   Osteoarthritis 02/01/2019   Hypothyroidism 02/01/2019   Anticoagulant long-term use    Persistent atrial fibrillation (HCC) 06/27/2017   Hip fracture (HCC) 06/27/2017   Irregular heart rate    Closed right hip fracture, initial encounter (HCC) 06/26/2017   Protein-calorie malnutrition, severe 06/30/2016   S/P laparoscopic cholecystectomy 06/28/2016   Visual field loss following stroke    Hyperlipidemia    Essential hypertension    PCP:  Janey Santos, MD Pharmacy:   [facility] [id number], [place] - [address] [place] [address] Phone: [phone] Fax: [phone]     Social Drivers of Health (SDOH) Social History: SDOH Screenings   Food Insecurity: No Food Insecurity (03/18/2024)  Housing: Low Risk (03/18/2024)  Transportation Needs: No Transportation Needs (03/18/2024)  Utilities: Not At Risk (03/18/2024)  Depression (PHQ2-9): Low Risk (02/28/2023)  Social Connections: Socially Integrated (03/18/2024)  Tobacco Use: Medium Risk (09/09/2024)   SDOH Interventions:     Readmission Risk Interventions    01/27/2024   10:41 AM 01/03/2024   12:11 PM 05/23/2023   10:20 AM  Readmission Risk Prevention Plan  Transportation Screening Complete Complete Complete  PCP or Specialist Appt within  5-7 Days Complete  Complete  PCP or Specialist Appt within 3-5 Days  Complete   Home Care Screening Complete  Complete  Medication Review (RN CM) Complete  Complete  HRI or Home Care Consult  Complete   Social Work Consult for Recovery Care Planning/Counseling  Complete   Palliative Care Screening  Complete   Medication Review Oceanographer)  Complete

## 2024-09-11 NOTE — Progress Notes (Signed)
 "   DAILY PROGRESS NOTE   Patient Name: [name] Date of Encounter: 09/11/2024 Cardiologist: Peter Jordan, MD  Chief Complaint   Feels well today  Patient Profile   [name] is a 79 y.o. female with a hx of hypertension, hyperlipidemia, hypothyroidism, history of CVA in 2014, non-small cell lung cancer stage Ia, CKD stage IIIa, anemia from urinary bleed, Takotsubo cardiomyopathy, chronic HFpEF, and atrial fibrillation who is being seen 09/10/2024 for the evaluation of atrial fibrillation at the request of Delon Hoe.   Subjective   Noted have have some episodes of NSVT overnight - underlying rhythm is afib. Potasium 3.6 - needs magnesium  check. Left arm is weak, but she can move it and hold it up for a short while against gravity. No GU Bleeding overnight.  Objective   Vitals:   09/10/24 2010 09/11/24 0007 09/11/24 0430 09/11/24 0743  BP: 127/83 127/77 115/85 124/82  Pulse: 71 69 (!) 56 78  Resp: 18 18 18 18   Temp: 98.2 F (36.8 C) 98.2 F (36.8 C) (!) 97.4 F (36.3 C) 98.2 F (36.8 C)  TempSrc: Oral Oral Oral Oral  SpO2: 99% 98% 96% 98%  Weight:        Intake/Output Summary (Last 24 hours) at 09/11/2024 1026 Last data filed at 09/11/2024 9083 Gross per 24 hour  Intake 420 ml  Output --  Net 420 ml   Filed Weights   09/09/24 1533  Weight: 54 kg    Physical Exam   General appearance: alert and no distress Lungs: clear to auscultation bilaterally Heart: irregularly irregular rhythm Extremities: extremities normal, atraumatic, no cyanosis or edema Neurologic: Mental status: Alert, oriented, thought content appropriate, 4/5 LUE strength  Inpatient Medications    Scheduled Meds:  atorvastatin   10 mg Oral QHS   bisoprolol   2.5 mg Oral Daily   buPROPion   75 mg Oral q morning   FLUoxetine   20 mg Oral QHS   gabapentin   600 mg Oral BID   levothyroxine   88 mcg Oral Q0600   pantoprazole   40 mg Oral BID AC    Continuous Infusions:  cefTRIAXone   (ROCEPHIN )  IV 1 g (09/10/24 1825)   heparin  1,050 Units/hr (09/10/24 1929)    PRN Meds: acetaminophen  **OR** acetaminophen  (TYLENOL ) oral liquid 160 mg/5 mL **OR** acetaminophen , albuterol , fentaNYL  (SUBLIMAZE ) injection, oxyCODONE , prochlorperazine , senna-docusate   Labs   Results for orders placed or performed during the hospital encounter of 09/09/24 (from the past 48 hours)  Ethanol     Status: None   Collection Time: 09/09/24  3:09 PM  Result Value Ref Range   Alcohol , Ethyl (B) <15 <15 mg/dL    Comment: (NOTE) For medical purposes only. Performed at [facility], [address], [place], [place] [address]   Protime-INR     Status: None   Collection Time: 09/09/24  3:20 PM  Result Value Ref Range   Prothrombin  Time 13.8 11.4 - 15.2 seconds   INR 1.0 0.8 - 1.2    Comment: (NOTE) INR goal varies based on device and disease states. Performed at [facility], [address], [place], [place] [address]   APTT     Status: None   Collection Time: 09/09/24  3:20 PM  Result Value Ref Range   aPTT 30 24 - 36 seconds    Comment: Performed at [facility], [address] [place], [place] [address]  CBC     Status: Abnormal   Collection  Time: 09/09/24  3:20 PM  Result Value Ref Range   WBC 7.7 4.0 - 10.5 K/uL   RBC 3.89 3.87 - 5.11 MIL/uL   Hemoglobin 11.9 (L) 12.0 - 15.0 g/dL   HCT 62.3 63.9 - 53.9 %   MCV 96.7 80.0 - 100.0 fL   MCH 30.6 26.0 - 34.0 pg   MCHC 31.6 30.0 - 36.0 g/dL   RDW 86.0 88.4 - 84.4 %   Platelets 252 150 - 400 K/uL   nRBC 0.0 0.0 - 0.2 %    Comment: Performed at [facility], [address], [place], [place] [address]  Differential     Status: None   Collection Time: 09/09/24  3:20 PM  Result Value Ref Range   Neutrophils Relative % 79 %   Neutro Abs 6.1 1.7 - 7.7 K/uL   Lymphocytes Relative 15 %   Lymphs Abs 1.1 0.7 - 4.0 K/uL   Monocytes Relative 5 %   Monocytes  Absolute 0.4 0.1 - 1.0 K/uL   Eosinophils Relative 0 %   Eosinophils Absolute 0.0 0.0 - 0.5 K/uL   Basophils Relative 0 %   Basophils Absolute 0.0 0.0 - 0.1 K/uL   Immature Granulocytes 1 %   Abs Immature Granulocytes 0.04 0.00 - 0.07 K/uL    Comment: Performed at [facility], [address] [place], [place] [address]  Comprehensive metabolic panel     Status: Abnormal   Collection Time: 09/09/24  3:20 PM  Result Value Ref Range   Sodium 143 135 - 145 mmol/L   Potassium 4.2 3.5 - 5.1 mmol/L   Chloride 110 98 - 111 mmol/L   CO2 21 (L) 22 - 32 mmol/L   Glucose, Bld 116 (H) 70 - 99 mg/dL    Comment: Glucose reference range applies only to samples taken after fasting for at least 8 hours.   BUN 21 8 - 23 mg/dL   Creatinine, Ser 8.43 (H) 0.44 - 1.00 mg/dL   Calcium  9.7 8.9 - 10.3 mg/dL   Total Protein 6.8 6.5 - 8.1 g/dL   Albumin 3.9 3.5 - 5.0 g/dL   AST 14 (L) 15 - 41 U/L   ALT 6 0 - 44 U/L   Alkaline Phosphatase 81 38 - 126 U/L   Total Bilirubin 0.3 0.0 - 1.2 mg/dL   GFR, Estimated 33 (L) >60 mL/min    Comment: (NOTE) Calculated using the CKD-EPI Creatinine Equation (2021)    Anion gap 11 5 - 15    Comment: Performed at [facility], [address] [place], [place] [address]  Troponin T, High Sensitivity     Status: Abnormal   Collection Time: 09/09/24  3:20 PM  Result Value Ref Range   Troponin T High Sensitivity 25 (H) 0 - 19 ng/L    Comment: (NOTE) Biotin concentrations > 1000 ng/mL falsely decrease TnT results.  Serial cardiac troponin measurements are suggested.  Refer to the Links section for chest pain algorithms and additional  guidance. Performed at [facility], [address] [place], [place] [address]   I-stat chem 8, ED     Status: Abnormal   Collection Time: 09/09/24  3:26 PM  Result Value Ref Range   Sodium 144 135 - 145 mmol/L   Potassium 4.1 3.5 - 5.1 mmol/L   Chloride 109 98 - 111 mmol/L    BUN 22 8 - 23 mg/dL   Creatinine, Ser 8.29 (H) 0.44 - 1.00 mg/dL  Glucose, Bld 113 (H) 70 - 99 mg/dL    Comment: Glucose reference range applies only to samples taken after fasting for at least 8 hours.   Calcium , Ion 1.30 1.15 - 1.40 mmol/L   TCO2 22 22 - 32 mmol/L   Hemoglobin 12.6 12.0 - 15.0 g/dL   HCT 62.9 63.9 - 53.9 %  I-stat chem 8, ED     Status: Abnormal   Collection Time: 09/09/24  3:54 PM  Result Value Ref Range   Sodium 139 135 - 145 mmol/L   Potassium 4.3 3.5 - 5.1 mmol/L   Chloride 104 98 - 111 mmol/L   BUN 24 (H) 8 - 23 mg/dL   Creatinine, Ser 8.29 (H) 0.44 - 1.00 mg/dL   Glucose, Bld 97 70 - 99 mg/dL    Comment: Glucose reference range applies only to samples taken after fasting for at least 8 hours.   Calcium , Ion 1.25 1.15 - 1.40 mmol/L   TCO2 22 22 - 32 mmol/L   Hemoglobin 9.9 (L) 12.0 - 15.0 g/dL   HCT 70.9 (L) 63.9 - 53.9 %  Troponin T, High Sensitivity     Status: Abnormal   Collection Time: 09/09/24  5:49 PM  Result Value Ref Range   Troponin T High Sensitivity 22 (H) 0 - 19 ng/L    Comment: (NOTE) Biotin concentrations > 1000 ng/mL falsely decrease TnT results.  Serial cardiac troponin measurements are suggested.  Refer to the Links section for chest pain algorithms and additional  guidance. Performed at [facility], [address], [place], [place] [address]   Heparin  level (unfractionated)     Status: None   Collection Time: 09/10/24  3:36 AM  Result Value Ref Range   Heparin  Unfractionated 0.33 0.30 - 0.70 IU/mL    Comment: (NOTE) The clinical reportable range upper limit is being lowered to >1.10 to align with the FDA approved guidance for the current laboratory assay.  If heparin  results are below expected values, and patient dosage has  been confirmed, suggest follow up testing of antithrombin III levels. Performed at [facility], [address] [place], [place] [address]   CBC     Status:  Abnormal   Collection Time: 09/10/24  3:36 AM  Result Value Ref Range   WBC 6.5 4.0 - 10.5 K/uL   RBC 3.44 (L) 3.87 - 5.11 MIL/uL   Hemoglobin 10.6 (L) 12.0 - 15.0 g/dL   HCT 66.4 (L) 63.9 - 53.9 %   MCV 97.4 80.0 - 100.0 fL   MCH 30.8 26.0 - 34.0 pg   MCHC 31.6 30.0 - 36.0 g/dL   RDW 86.1 88.4 - 84.4 %   Platelets 191 150 - 400 K/uL   nRBC 0.0 0.0 - 0.2 %    Comment: Performed at [facility], [address] [place], [place] [address]  Lipid panel     Status: None   Collection Time: 09/10/24  3:36 AM  Result Value Ref Range   Cholesterol 138 0 - 200 mg/dL    Comment:        ATP III CLASSIFICATION:  <200     mg/dL   Desirable  799-760  mg/dL   Borderline High  >=759    mg/dL   High           Triglycerides 70 <150 mg/dL   HDL 56 >59 mg/dL   Total CHOL/HDL Ratio 2.5 RATIO   VLDL 14 0 - 40 mg/dL  LDL Cholesterol 68 0 - 99 mg/dL    Comment:        Total Cholesterol/HDL:CHD Risk Coronary Heart Disease Risk Table                     Men   Women  1/2 Average Risk   3.4   3.3  Average Risk       5.0   4.4  2 X Average Risk   9.6   7.1  3 X Average Risk  23.4   11.0        Use the calculated Patient Ratio above and the CHD Risk Table to determine the patient's CHD Risk.        ATP III CLASSIFICATION (LDL):  <100     mg/dL   Optimal  899-870  mg/dL   Near or Above                    Optimal  130-159  mg/dL   Borderline  839-810  mg/dL   High  >809     mg/dL   Very High Performed at [facility], [address] [place], [place] [address]   Hemoglobin A1c     Status: None   Collection Time: 09/10/24  3:36 AM  Result Value Ref Range   Hgb A1c MFr Bld 5.5 4.8 - 5.6 %    Comment: (NOTE) Diagnosis of Diabetes The following HbA1c ranges recommended by the American Diabetes Association (ADA) may be used as an aid in the diagnosis of diabetes mellitus.  Hemoglobin             Suggested A1C NGSP%              Diagnosis  <5.7                    Non Diabetic  5.7-6.4                Pre-Diabetic  >6.4                   Diabetic  <7.0                   Glycemic control for                       adults with diabetes.     Mean Plasma Glucose 111.15 mg/dL    Comment: Performed at [facility], [address] [place], [place] [address]  Comprehensive metabolic panel     Status: Abnormal   Collection Time: 09/10/24  3:36 AM  Result Value Ref Range   Sodium 142 135 - 145 mmol/L   Potassium 4.0 3.5 - 5.1 mmol/L   Chloride 109 98 - 111 mmol/L   CO2 25 22 - 32 mmol/L   Glucose, Bld 80 70 - 99 mg/dL    Comment: Glucose reference range applies only to samples taken after fasting for at least 8 hours.   BUN 18 8 - 23 mg/dL   Creatinine, Ser 8.52 (H) 0.44 - 1.00 mg/dL   Calcium  9.3 8.9 - 10.3 mg/dL   Total Protein 6.3 (L) 6.5 - 8.1 g/dL   Albumin 3.6 3.5 - 5.0 g/dL   AST 12 (L) 15 - 41 U/L   ALT 5 0 - 44 U/L   Alkaline Phosphatase 71 38 - 126 U/L   Total Bilirubin 0.3 0.0 - 1.2 mg/dL  GFR, Estimated 36 (L) >60 mL/min    Comment: (NOTE) Calculated using the CKD-EPI Creatinine Equation (2021)    Anion gap 9 5 - 15    Comment: Performed at [facility], [address] [place], [place] [address]  Urine rapid drug screen (hosp performed)     Status: Abnormal   Collection Time: 09/10/24  6:47 AM  Result Value Ref Range   Opiates NEGATIVE NEGATIVE   Cocaine NEGATIVE NEGATIVE   Benzodiazepines NEGATIVE NEGATIVE   Amphetamines NEGATIVE NEGATIVE   Tetrahydrocannabinol NEGATIVE NEGATIVE   Barbiturates NEGATIVE NEGATIVE   Methadone Scn, Ur NEGATIVE NEGATIVE   Fentanyl  POSITIVE (A) NEGATIVE    Comment: (NOTE) Drug screen is for Medical Purposes only. Positive results are preliminary only. If confirmation is needed, notify lab within 5 days.  Drug Class                 Cutoff (ng/mL) Amphetamine and metabolites 1000 Barbiturate and metabolites 200 Benzodiazepine              200 Opiates and  metabolites     300 Cocaine and metabolites     300 THC                         50 Fentanyl                     5 Methadone                   300  Trazodone  is metabolized in vivo to several metabolites,  including pharmacologically active m-CPP, which is excreted in the  urine.  Immunoassay screens for amphetamines and MDMA have potential  cross-reactivity with these compounds and may provide false positive  result.  Performed at [facility], [address] [place], [place] [address]   Heparin  level (unfractionated)     Status: Abnormal   Collection Time: 09/10/24  5:49 PM  Result Value Ref Range   Heparin  Unfractionated 0.20 (L) 0.30 - 0.70 IU/mL    Comment: (NOTE) The clinical reportable range upper limit is being lowered to >1.10 to align with the FDA approved guidance for the current laboratory assay.  If heparin  results are below expected values, and patient dosage has  been confirmed, suggest follow up testing of antithrombin III levels. Performed at [facility], [address] [place], [place] [address]   CBC     Status: Abnormal   Collection Time: 09/11/24  2:24 AM  Result Value Ref Range   WBC 5.0 4.0 - 10.5 K/uL   RBC 3.22 (L) 3.87 - 5.11 MIL/uL   Hemoglobin 9.9 (L) 12.0 - 15.0 g/dL   HCT 68.8 (L) 63.9 - 53.9 %   MCV 96.6 80.0 - 100.0 fL   MCH 30.7 26.0 - 34.0 pg   MCHC 31.8 30.0 - 36.0 g/dL   RDW 86.0 88.4 - 84.4 %   Platelets 181 150 - 400 K/uL   nRBC 0.0 0.0 - 0.2 %    Comment: Performed at [facility], [address], [place], [place] [address]  Basic metabolic panel with GFR     Status: Abnormal   Collection Time: 09/11/24  2:24 AM  Result Value Ref Range   Sodium 141 135 - 145 mmol/L   Potassium 3.6 3.5 - 5.1 mmol/L   Chloride 108 98 - 111 mmol/L   CO2 23 22 - 32 mmol/L  Glucose, Bld 118 (H) 70 - 99 mg/dL    Comment: Glucose reference range applies only to samples taken after fasting for at least 8 hours.   BUN  16 8 - 23 mg/dL   Creatinine, Ser 8.43 (H) 0.44 - 1.00 mg/dL   Calcium  8.9 8.9 - 10.3 mg/dL   GFR, Estimated 33 (L) >60 mL/min    Comment: (NOTE) Calculated using the CKD-EPI Creatinine Equation (2021)    Anion gap 10 5 - 15    Comment: Performed at [facility], [address] [place], [place] [address]  Heparin  level (unfractionated)     Status: None   Collection Time: 09/11/24  2:24 AM  Result Value Ref Range   Heparin  Unfractionated 0.33 0.30 - 0.70 IU/mL    Comment: (NOTE) The clinical reportable range upper limit is being lowered to >1.10 to align with the FDA approved guidance for the current laboratory assay.  If heparin  results are below expected values, and patient dosage has  been confirmed, suggest follow up testing of antithrombin III levels. Performed at [facility], [address], [place], [place] [address]     ECG   N/A  Telemetry   Afib with PVC's and runs of NSVT - Personally Reviewed  Radiology    MR CERVICAL SPINE WO CONTRAST Result Date: 09/10/2024 EXAM: MRI CERVICAL SPINE WITHOUT CONTRAST 09/10/2024 12:18:00 PM TECHNIQUE: Multiplanar multisequence MRI of the cervical spine was performed. COMPARISON: Cervical spine CT 12/01/2016. CTA head and neck yesterday. CLINICAL HISTORY: 79 year old female with left upper extremity pain, numbness, and weakness. Acute cervical spine myelopathy. FINDINGS: BONES AND ALIGNMENT: Mild cervicothoracic scoliosis. Straightening of cervical lordosis with multilevel mild degenerative appearing anterolisthesis, most pronounced at C4-C5. Normal vertebral body heights. Background marrow signal is normal. No marrow edema identified. Widespread chronic cervical facet arthropathy. SPINAL CORD: Normal spinal cord size. No abnormal spinal cord signal. SOFT TISSUES: No paraspinal mass. Preserved major vascular flow voids in the bilateral neck with tortuous vertebral arteries. Negative visible neck soft tissues and thoracic  inlet. Negative cervicomedullary junction and visible cerebellum. Patchy T2 hyperintensity in the brainstem, most commonly due to chronic small vessel disease. DEGENERATIVE: C2-C3: Moderate facet hypertrophy greater on the right, moderate ligamentum flavum hypertrophy. Mild disc bulging and endplate spurring. Mild spinal stenosis. No cord mass effect. Moderate left C3 neural foraminal stenosis. C3-C4: Moderate to severe facet and ligamentum flavum hypertrophy greater on the left. Mild disc bulging and endplate spurring. Mild spinal stenosis. No spinal cord mass effect. Tortuosity of the left vertebral artery in the left C4 neural foramen, moderate superimposed left foraminal stenosis. C4-C5: Mild anterolisthesis. Circumferential disc bulging, pseudodisc. Mild endplate spurring. Severe facet and ligamentum flavum hypertrophy bilaterally. Mild spinal stenosis. No cord mass effect (series 10 image 25). Tortuous vertebral arteries in the C5 neural foramina here. Moderate bilateral foraminal stenosis suspected. C5-C6: Circumferential disc bulging and endplate spurring. Moderate facet and mild ligamentum flavum hypertrophy. No spinal stenosis. Mild C6 neural foraminal stenosis. C6-C7: Disc space loss. Circumferential disc osteophyte complex asymmetric to the right. Mild posterior element hypertrophy. No spinal stenosis. Mild to moderate right C7 foraminal stenosis. C7-T1: Disc space loss. Circumferential disc osteophyte complex. Mild to moderate facet and ligamentum flavum hypertrophy. No spinal stenosis. Mild right C8 foraminal stenosis. No visible upper thoracic spinal stenosis. IMPRESSION: 1. Widespread chronic cervical degeneration. Mild multifactorial spinal stenosis from C2-C3 through C4-C5. No spinal cord mass effect or signal abnormality. 2. Bulky cervical facet degeneration, maximal at C4-C5 where  this is mild anterolisthesis. Up to moderate associated degenerative cervical foraminal stenosis, with superimposed  vertebral artery tortuosity in several of the cervical neural foramina. Electronically signed by: Helayne Hurst MD 09/10/2024 12:38 PM EST RP Workstation: [id number]   ECHOCARDIOGRAM COMPLETE BUBBLE STUDY Result Date: 09/10/2024    ECHOCARDIOGRAM REPORT   Patient Name:   [name] Date of Exam: 09/10/2024 Medical Rec #:  [id number]       Height:       66.0 in Accession #:    [id number]      Weight:       119.0 lb Date of Birth:  [birth year]       BSA:          1.604 m Patient Age:    78 years        BP:           134/91 mmHg Patient Gender: F               HR:           73 bpm. Exam Location:  Inpatient Procedure: 2D Echo, Cardiac Doppler, Color Doppler, Saline Contrast Bubble Study            and Intracardiac Opacification Agent (Both Spectral and Color Flow            Doppler were utilized during procedure). Indications:    Atrial thrombus  History:        Patient has prior history of Echocardiogram examinations, most                 recent 03/18/2024.  Sonographer:    Odella Brewster Referring Phys: [id number] TIMOTHY S OPYD  Sonographer Comments: Image acquisition challenging due to respiratory motion. IMPRESSIONS  1. Echodense mass ( 1.35 cm x 2.29 cm) seen in the left atrium suspected to be a left atrial thrombus, this is not present on prior study on 03/18/2024. May benefiit from a cardiac MR. Left atrial size was severely dilated.  2. Left ventricular ejection fraction, by estimation, is 60 to 65%. The left ventricle has normal function. The left ventricle has no regional wall motion abnormalities. There is mild asymmetric left ventricular hypertrophy of the septal segment. Left ventricular diastolic parameters were normal.  3. Right ventricular systolic function is normal. The right ventricular size is normal.  4. Right atrial size was severely dilated.  5. The mitral valve is normal in structure. Mild to moderate mitral valve regurgitation. No evidence of mitral stenosis.  6. Tricuspid valve regurgitation is  mild to moderate.  7. The aortic valve is calcified. There is mild calcification of the aortic valve. There is mild thickening of the aortic valve. Aortic valve regurgitation is not visualized. Aortic valve sclerosis/calcification is present, without any evidence of aortic stenosis.  8. The inferior vena cava is normal in size with greater than 50% respiratory variability, suggesting right atrial pressure of 3 mmHg.  9. Agitated saline contrast bubble study was negative, with no evidence of any interatrial shunt. FINDINGS  Left Ventricle: Left ventricular ejection fraction, by estimation, is 60 to 65%. The left ventricle has normal function. The left ventricle has no regional wall motion abnormalities. Definity  contrast agent was given IV to delineate the left ventricular  endocardial borders. The left ventricular internal cavity size was normal in size. There is mild asymmetric left ventricular hypertrophy of the septal segment. Left ventricular diastolic parameters were normal. Right Ventricle: The right ventricular size is normal. No increase in  right ventricular wall thickness. Right ventricular systolic function is normal. Left Atrium: Echodense mass ( 1.35 cm x 2.29 cm) seen in the left atrium suspected to be a left atrial thrombus, this is not present on prior study on 03/18/2024. May benefiit from a cardiac MR. Left atrial size was severely dilated. Right Atrium: Right atrial size was severely dilated. Pericardium: There is no evidence of pericardial effusion. Mitral Valve: The mitral valve is normal in structure. Mild to moderate mitral valve regurgitation. No evidence of mitral valve stenosis. MV peak gradient, 7.4 mmHg. The mean mitral valve gradient is 2.0 mmHg. Tricuspid Valve: The tricuspid valve is normal in structure. Tricuspid valve regurgitation is mild to moderate. No evidence of tricuspid stenosis. Aortic Valve: The aortic valve is calcified. There is mild calcification of the aortic valve. There  is mild thickening of the aortic valve. There is moderate aortic valve annular calcification. Aortic valve regurgitation is not visualized. Aortic valve sclerosis/calcification is present, without any evidence of aortic stenosis. Aortic valve mean gradient measures 3.0 mmHg. Aortic valve peak gradient measures 5.6 mmHg. Aortic valve area, by VTI measures 2.91 cm. Pulmonic Valve: The pulmonic valve was normal in structure. Pulmonic valve regurgitation is not visualized. No evidence of pulmonic stenosis. Aorta: The aortic root is normal in size and structure. Venous: The inferior vena cava is normal in size with greater than 50% respiratory variability, suggesting right atrial pressure of 3 mmHg. IAS/Shunts: No atrial level shunt detected by color flow Doppler. Agitated saline contrast was given intravenously to evaluate for intracardiac shunting. Agitated saline contrast bubble study was negative, with no evidence of any interatrial shunt.  LEFT VENTRICLE PLAX 2D LVIDd:         3.10 cm     Diastology LVIDs:         2.20 cm     LV e' medial:    7.72 cm/s LV PW:         0.80 cm     LV E/e' medial:  17.9 LV IVS:        1.00 cm     LV e' lateral:   7.51 cm/s LVOT diam:     2.20 cm     LV E/e' lateral: 18.4 LV SV:         63 LV SV Index:   40 LVOT Area:     3.80 cm LV IVRT:       74 msec  LV Volumes (MOD) LV vol d, MOD A2C: 52.4 ml LV vol d, MOD A4C: 45.3 ml LV vol s, MOD A2C: 13.0 ml LV vol s, MOD A4C: 12.5 ml LV SV MOD A2C:     39.4 ml LV SV MOD A4C:     45.3 ml LV SV MOD BP:      36.9 ml RIGHT VENTRICLE             IVC RV S prime:     10.00 cm/s  IVC diam: 1.60 cm TAPSE (M-mode): 1.9 cm                             PULMONARY VEINS                             Diastolic Velocity: 47.20 cm/s  S/D Velocity:       0.90                             Systolic Velocity:  42.10 cm/s LEFT ATRIUM              Index        RIGHT ATRIUM           Index LA diam:        4.50 cm  2.81 cm/m   RA Area:      28.10 cm LA Vol (A2C):   108.0 ml 67.34 ml/m  RA Volume:   93.10 ml  58.05 ml/m LA Vol (A4C):   134.0 ml 83.55 ml/m LA Biplane Vol: 120.0 ml 74.82 ml/m  AORTIC VALVE                    PULMONIC VALVE AV Area (Vmax):    2.66 cm     PV Vmax:          0.90 m/s AV Area (Vmean):   2.63 cm     PV Peak grad:     3.3 mmHg AV Area (VTI):     2.91 cm     PR End Diast Vel: 9.86 msec AV Vmax:           118.00 cm/s AV Vmean:          80.400 cm/s AV VTI:            0.218 m AV Peak Grad:      5.6 mmHg AV Mean Grad:      3.0 mmHg LVOT Vmax:         82.50 cm/s LVOT Vmean:        55.600 cm/s LVOT VTI:          0.167 m LVOT/AV VTI ratio: 0.77  AORTA Ao Root diam: 3.70 cm MITRAL VALVE                TRICUSPID VALVE MV Area (PHT): 4.44 cm     TR Peak grad:   36.5 mmHg MV Area VTI:   2.56 cm     TR Vmax:        302.00 cm/s MV Peak grad:  7.4 mmHg MV Mean grad:  2.0 mmHg     SHUNTS MV Vmax:       1.36 m/s     Systemic VTI:  0.17 m MV Vmean:      59.3 cm/s    Systemic Diam: 2.20 cm MV Decel Time: 171 msec MV E velocity: 138.00 cm/s MV A velocity: 42.10 cm/s MV E/A ratio:  3.28 Kardie Tobb DO Electronically signed by Dub Huntsman DO Signature Date/Time: 09/10/2024/10:36:22 AM    Final    MR BRAIN WO CONTRAST Result Date: 09/09/2024 EXAM: MRI BRAIN WITHOUT CONTRAST 09/09/2024 06:49:00 PM TECHNIQUE: Multiplanar multisequence MRI of the head/brain was performed without the administration of intravenous contrast. COMPARISON: Head CT and CTA 09/09/2024 and MRI 12/13/2022. CLINICAL HISTORY: Neuro deficit, acute, stroke suspected. Left arm pain and numbness. FINDINGS: BRAIN AND VENTRICLES: There is no evidence of an acute infarct, intracranial hemorrhage, mass, midline shift, hydrocephalus, or extra-axial fluid collection. There is mild cerebral atrophy. T2 hyperintensities in the cerebral white matter and pons are similar to the prior MRI and nonspecific but compatible with mild chronic small vessel ischemic disease. Major intracranial  vascular flow voids are preserved. Small right ICA aneurysm,  evaluated in greater detail on today's earlier CTA. ORBITS: Bilateral cataract extraction. SINUSES AND MASTOIDS: No significant abnormality. BONES AND SOFT TISSUES: Small left frontal scalp lipoma. IMPRESSION: 1. No acute intracranial abnormality. 2. Mild chronic small vessel ischemic disease. Electronically signed by: Dasie Hamburg MD 09/09/2024 07:18 PM EST RP Workstation: [id number]   CT ANGIO CODE STROKE CHEST/ABD/PEL FOR DISSECTION W &/OR WO CONTRAST Result Date: 09/09/2024 CLINICAL DATA:  Neurologic deficit. Concern for aortic dissection. Weakness. EXAM: CT ANGIOGRAPHY CHEST, ABDOMEN AND PELVIS TECHNIQUE: Non-contrast CT of the chest was initially obtained. Multidetector CT imaging through the chest, abdomen and pelvis was performed using the standard protocol during bolus administration of intravenous contrast. Multiplanar reconstructed images and MIPs were obtained and reviewed to evaluate the vascular anatomy. RADIATION DOSE REDUCTION: This exam was performed according to the departmental dose-optimization program which includes automated exposure control, adjustment of the mA and/or kV according to patient size and/or use of iterative reconstruction technique. CONTRAST:  OMNIPAQUE  IOHEXOL  350 MG/ML SOLN COMPARISON:  Chest CT dated 05/28/2024. FINDINGS: CTA CHEST FINDINGS Cardiovascular: There is mild cardiomegaly with biatrial dilatation. A 14 x 7 mm lesion in the left atrium is not characterized on this CT but may represent a blood clot, or a primary cardiac tumor such as myxoma. Further evaluation with echocardiogram or MRI without and with contrast recommended. No pericardial effusion. There is coronary vascular calcification. There is moderate atherosclerotic calcification of the thoracic aorta. No aneurysmal dilatation or dissection. The origins of the great vessels of the aortic arch are patent. No pulmonary artery embolus  identified. Mediastinum/Nodes: No hilar or mediastinal adenopathy. There is a moderate size hiatal hernia containing a portion of the spine. The esophagus is grossly unremarkable. No mediastinal fluid collection. Lungs/Pleura: Stable area of consolidation in the right middle lobe, likely scarring. Similar appearance of a cluster of nodularity in the left upper lobe with the largest nodule measuring up to 6 mm. Several additional faint ground-glass nodules in the left lower lobe measure up to 6 mm and similar to prior CT. Additional ground-glass nodule in the right upper lobe measure up to 5 mm and also similar to prior CT. There is trace right pleural effusion. No new consolidation. No pneumothorax. The central airways are patent. Musculoskeletal: Osteopenia with degenerative changes of the spine. No acute osseous pathology. Review of the MIP images confirms the above findings. CTA ABDOMEN AND PELVIS FINDINGS VASCULAR Aorta: Advanced atherosclerotic calcification of the abdominal aorta. No aneurysmal dilatation or dissection. Celiac: The celiac trunk and its major branches are patent. SMA: The SMA is patent. Renals: Atherosclerotic calcification of the left renal artery. 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 The left renal artery remains patent. IMA: The IMA is patent. Inflow: Moderate atherosclerotic calcification of the iliac arteries. No aneurysmal dilatation or dissection. The iliac arteries are patent. Veins: No obvious venous abnormality within the limitations of this arterial phase study. Review of the MIP images confirms the above findings. NON-VASCULAR No intra-abdominal free air or free fluid. Hepatobiliary: Subcentimeter hepatic hypodense lesions are too small to characterize. Cholecystectomy. Dilatation of the common bile duct measuring 16 mm in diameter. No retained calcified stone noted in the central CBD. Pancreas: Unremarkable. No pancreatic ductal dilatation or surrounding  inflammatory changes. Spleen: Normal in size without focal abnormality. Adrenals/Urinary Tract: The adrenal glands unremarkable. Solitary left kidney. There is mild left hydronephrosis. Extrarenal left pelvis with mild pelviectasis. There is mild left hydroureter. No obstructing stone. Underlying  urothelial lesion at the level of the left ureterovesical junction as the cause of obstruction is not excluded. The urinary bladder is minimally distended. There is thickened and trabeculated appearance of the bladder wall which may be related to chronic bladder dysfunction. An infiltrative process is not excluded. Cystoscopy may provide better evaluation. There is perivesical stranding. Correlation with urinalysis recommended to exclude cystitis. Stomach/Bowel: There is no bowel obstruction or active inflammation. The appendix is not visualized with certainty. No inflammatory changes identified in the right lower quadrant. Lymphatic: No adenopathy. Reproductive: Hysterectomy. Other: None Musculoskeletal: Osteopenia with degenerative changes of the spine. Lower lumbar posterior fusion. Total right hip arthroplasty. No acute osseous pathology. Review of the MIP images confirms the above findings. IMPRESSION: 1. No aortic dissection or aneurysm. 2. A 14 x 7 mm lesion in the left atrium may represent a blood clot, or a primary cardiac tumor such as myxoma. Further evaluation with echocardiogram or MRI without and with contrast recommended. 3. Mild left hydronephrosis and hydroureter. No obstructing stone. Underlying urothelial lesion at the level of the left ureterovesical junction as the cause of obstruction is not excluded. Further evaluation with cystoscopy is recommended. 4. Thickened and trabeculated appearance of the bladder wall may be related to chronic bladder dysfunction. An infiltrative process is not excluded. Cystoscopy may provide better evaluation. 5. Perivesical stranding. Correlation with urinalysis  recommended to exclude cystitis. 6. 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 7. No bowel obstruction. 8.  Aortic Atherosclerosis (ICD10-I70.0). Electronically Signed   By: Vanetta Chou M.D.   On: 09/09/2024 16:14   CT ANGIO HEAD NECK W WO CM Result Date: 09/09/2024 EXAM: CTA HEAD AND NECK WITH AND WITHOUT 09/09/2024 03:43:00 PM TECHNIQUE: CTA of the head and neck was performed with and without the administration of 125 mL of intravenous contrast (iohexol  (OMNIPAQUE ) 350 MG/ML injection 150 mL IOHEXOL  350 MG/ML SOLN). Multiplanar 2D and/or 3D reformatted images are provided for review. Automated exposure control, iterative reconstruction, and/or weight based adjustment of the mA/kV was utilized to reduce the radiation dose to as low as reasonably achievable. Stenosis of the internal carotid arteries measured using NASCET criteria. COMPARISON: CT head dated 09/09/2024 03:43:00 PM CLINICAL HISTORY: Stroke/TIA, determine embolic source. FINDINGS: CTA NECK: AORTIC ARCH AND ARCH VESSELS: Atherosclerosis of the visualized aortic arch and aortic arch vessel origins. Mild stenosis at the origin of the left subclavian artery. No dissection or arterial injury. CERVICAL CAROTID ARTERIES: Mild atherosclerosis of the right carotid bifurcation without hemodynamically significant stenosis of the mid right cervical ICA. Mild atherosclerosis along the mid left common carotid artery and at the left carotid bifurcation without hemodynamically significant stenosis. Prominent tortuosity of the mid and distal left cervical ICA. Multiple areas of irregularity with subtle narrowing and outpouching of the left cervical ICA which may reflect fibromuscular dysplasia. No dissection or arterial injury. CERVICAL VERTEBRAL ARTERIES: Tortuosity of the V1 segment of the right vertebral artery. No dissection, arterial injury, or significant stenosis. LUNGS AND MEDIASTINUM: Incompletely visualized 1.0 cm nodule in the anterior left upper  lobe seen on series 6 image 13. The remainder of the visualized lungs and mediastinum are unremarkable. SOFT TISSUES: Edentulous maxilla and mandible. Degenerative changes of the left temporomandibular joint. No acute abnormality. BONES: Degenerative changes throughout the visualized spine. Disc space narrowing is greatest at C6-C7. No acute abnormality. CTA HEAD: ANTERIOR CIRCULATION: Atherosclerosis of the carotid siphons without significant stenosis. There is a 5 x 4 x 5 mm laterally and  slightly posteriorly directed outpouching along the anterior genia of the right cavernous ICA/right paraophthalmic ICA (series 9 image 105). There is an additional 1.5 mm superiorly directed outpouching along the right paraophthalmic ICA (series 9 image 101). 2.5 mm inferiorly directed outpouching along the left supraclinoid ICA which may reflect an infundibulum at the posterior communicating artery origin versus small aneurysm. No significant stenosis of the anterior cerebral arteries. No significant stenosis of the middle cerebral arteries. POSTERIOR CIRCULATION: Fetal origin of the right PCA. No significant stenosis of the posterior cerebral arteries. No significant stenosis of the basilar artery. No significant stenosis of the vertebral arteries. No aneurysm. OTHER: No dural venous sinus thrombosis on this non-dedicated study. IMPRESSION: 1. No acute large vessel occlusion. 2.  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 3. 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 4. 2.5 mm outpouching along the left supraclinoid ICA, possibly representing an infundibulum versus small aneurysm. 5. Prominent tortuosity of the mid and distal left cervical ICA with multiple areas of irregularity, subtle narrowing, and outpouching, possibly reflecting fibromuscular dysplasia. 6. Mild atherosclerosis as above. 7. Incompletely visualized 1.0 cm left upper lobe pulmonary nodule, with prompt  non-contrast chest CT for full evaluation recommended per Fleischner Society Guidelines. Electronically signed by: Donnice Mania MD 09/09/2024 04:13 PM EST RP Workstation: [id number]   CT HEAD CODE STROKE WO CONTRAST (LKW 0-4.5h, LVO 0-24h) Result Date: 09/09/2024 EXAM: CT HEAD WITHOUT CONTRAST 09/09/2024 03:30:49 PM TECHNIQUE: CT of the head was performed without the administration of intravenous contrast. Automated exposure control, iterative reconstruction, and/or weight based adjustment of the mA/kV was utilized to reduce the radiation dose to as low as reasonably achievable. COMPARISON: Head CT and MRI 12/13/2022. CLINICAL HISTORY: Neuro deficit, acute, stroke suspected. Left arm pain and weakness. FINDINGS: BRAIN AND VENTRICLES: There is no evidence of an acute infarct, intracranial hemorrhage, mass, midline shift, hydrocephalus, or extra-axial fluid collection. There is mild cerebral atrophy. Cerebral white matter hypodensities are similar to the prior CT and nonspecific but compatible with mild chronic small vessel ischemic disease. Calcified atherosclerosis at the skull base. ORBITS: Bilateral cataract extraction. SINUSES: No acute abnormality. SOFT TISSUES AND SKULL: Small left frontal scalp lipoma. No skull fracture. [place] Stroke Program Early CT Score (ASPECTS) ----- Ganglionic (caudate, IC, lentiform nucleus, insula, M1-M3): 7 Supraganglionic (M4-M6): 3 Total: 10 IMPRESSION: 1. No acute intracranial abnormality. ASPECTS of 10. 2. Mild chronic small vessel ischemic disease. 3. These results were communicated to Dr. FREDRIK Hock at 3:34pm on 09/09/2024 by secure text page via the [facility] messaging system. Electronically signed by: Dasie Hamburg MD 09/09/2024 03:35 PM EST RP Workstation: [id number]    Cardiac Studies   N/A  Assessment   Principal Problem:   Left upper extremity numbness Active Problems:   Persistent atrial fibrillation (HCC)   Lung nodule   Lung cancer (HCC)   Acute  cystitis   COPD (chronic obstructive pulmonary disease) (HCC)   CKD stage 3a, GFR 45-59 ml/min (HCC)   Atrial thrombus   Hydronephrosis of left kidney   Bladder wall thickening   Left atrial mass   Plan   Noted to have some NSVT overnight -give 40 MEQ potassium today, add-on magnesium  and replete if <2.0. Continue IV heparin  - anticipate transition back to oral Eliquis  after 48 hours - would be reasonable given bleeding history to consider 2.5 mg BID dose (age ~89, weight <60 kg).  Time Spent Directly with Patient:  I have spent a total of  25 minutes with the patient reviewing hospital notes, telemetry, EKGs, labs and examining the patient as well as establishing an assessment and plan that was discussed personally with the patient.  > 50% of time was spent in direct patient care.  Length of Stay:  LOS: 1 day   Vinie KYM Maxcy, MD, [facility], FNLA, FACP  [place]  [facility]  Medical Director of the [facility]  [facility] Diplomate of the American Board of Clinical Lipidology Attending Cardiologist  Direct Dial: [phone]  Fax: [phone]  Website:  [email or web address] Vinie BROCKS Porshia Blizzard 09/11/2024, 10:26 AM   "

## 2024-09-11 NOTE — Progress Notes (Signed)
 " PROGRESS NOTE    [name]  FMW:[id number] DOB: 13-Nov-1945 DOA: 09/09/2024 PCP: Janey Santos, MD   Brief Narrative:  [name] is a 79 y.o. female with medical history significant for atrial fibrillation no longer anticoagulated due to bleeding, anxiety, hypertension, hyperlipidemia, hypothyroidism, and COPD, now presenting with pain, numbness, and weakness involving the left arm.    Patient reports that she is currently being treated for UTI and has had ongoing urinary symptoms but was otherwise in her usual state of health on the morning of 09/09/2024 until approximately 1030 or 11 AM when she developed pain in the distal left upper extremity.  She went on to develop numbness and also weakness in the left arm with symptoms now involving the entire arm.  She denied any headache or any other areas of numbness or weakness.  Assessment & Plan:   Principal Problem:   Left upper extremity numbness Active Problems:   Acute cystitis   Persistent atrial fibrillation (HCC)   Lung nodule   Lung cancer (HCC)   COPD (chronic obstructive pulmonary disease) (HCC)   CKD stage 3a, GFR 45-59 ml/min (HCC)   Atrial thrombus   Hydronephrosis of left kidney   Bladder wall thickening   Left atrial mass  Left upper extremity pain/numbness/tingling/weakness - Teleneurology recommended MRI brain which was negative for stroke - MRI cervical spine showed widespread chronic cervical degeneration - Question if her pain is a vascular event in setting of left atrial thrombus   Left atrial thrombus - Cardiology consulted - Echocardiogram showed echodense mass seen in left atrium, not present on prior study in July 2025 - Continue IV heparin .  Awaiting further recommendations by cardiology.   A-fib - Not on anticoagulation as outpatient due to recurrent bleeding - Currently on IV heparin  as above - Bisoprolol    Hyperlipidemia - Lipitor   Hypothyroidism - Continue Synthroid .  Check  TSH.   CKD 3A ruled out, patient has CKD stage IIIb. - Baseline creatinine around 1.5.  Currently stable   Acute cystitis, present on admission - She was started on Cipro  as outpatient.  Currently on Rocephin  in the hospital.    Left hydroureteronephrosis - CT:  Mild left hydronephrosis and hydroureter. No obstructing stone. Underlying urothelial lesion at the level of the left ureterovesical junction as the cause of obstruction is not excluded. Further evaluation with cystoscopy is recommended. Thickened and trabeculated appearance of the bladder wall may be related to chronic bladder dysfunction. An infiltrative process is not excluded. Cystoscopy may provide better evaluation. - Patient followed by Dr. Selma, [facility]   Lung nodule, history of non-small cell lung cancer - Followed by Dr. Gatha - Possible LUL nodule noted on CT in the ED.  She will need close outpatient follow-up  DVT prophylaxis: Heparin    Code Status: Full Code  Family Communication:  None present at bedside.  Plan of care discussed with patient in length and he/she verbalized understanding and agreed with it.  Status is: Inpatient Remains inpatient appropriate because: Awaiting plans by cardiology.   Estimated body mass index is 19.21 kg/m as calculated from the following:   Height as of 07/29/24: 5' 6 (1.676 m).   Weight as of this encounter: 54 kg.    Nutritional Assessment: Body mass index is 19.21 kg/m.[name] Seen by dietician.  I agree with the assessment and plan as outlined below: Nutrition Status:        . Skin Assessment: I have examined the patient's skin and  I agree with the wound assessment as performed by the wound care RN as outlined below:    Consultants:  Cardiology  Procedures:  None  Antimicrobials:  Anti-infectives (From admission, onward)    Start     Dose/Rate Route Frequency Ordered Stop   09/09/24 1800  cefTRIAXone  (ROCEPHIN ) 1 g in sodium chloride  0.9 % 100 mL  IVPB        1 g 200 mL/hr over 30 Minutes Intravenous Every 24 hours 09/09/24 1756           Subjective: Patient seen and examined, no family member were present at bedside.  Patient fully alert and oriented.  She says that she still feels numbness in the left arm and left hand but slightly improved than yesterday.  No other complaint.  Objective: Vitals:   09/10/24 2010 09/11/24 0007 09/11/24 0430 09/11/24 0743  BP: 127/83 127/77 115/85 124/82  Pulse: 71 69 (!) 56 78  Resp: 18 18 18 18   Temp: 98.2 F (36.8 C) 98.2 F (36.8 C) (!) 97.4 F (36.3 C) 98.2 F (36.8 C)  TempSrc: Oral Oral Oral Oral  SpO2: 99% 98% 96% 98%  Weight:        Intake/Output Summary (Last 24 hours) at 09/11/2024 0759 Last data filed at 09/11/2024 9385 Gross per 24 hour  Intake 300 ml  Output --  Net 300 ml   Filed Weights   09/09/24 1533  Weight: 54 kg    Examination:  General exam: Appears calm and comfortable  Respiratory system: Clear to auscultation. Respiratory effort normal. Cardiovascular system: S1 & S2 heard, RRR. No JVD, murmurs, rubs, gallops or clicks. No pedal edema. Gastrointestinal system: Abdomen is nondistended, soft and nontender. No organomegaly or masses felt. Normal bowel sounds heard. Central nervous system: Alert and oriented.  She appeared to have slight weakness in the left upper extremity. Extremities: Symmetric 5 x 5 power. Skin: No rashes, lesions or ulcers Psychiatry: Judgement and insight appear normal. Mood & affect appropriate.    Data Reviewed: I have personally reviewed following labs and imaging studies  CBC: Recent Labs  Lab 09/09/24 1520 09/09/24 1526 09/09/24 1554 09/10/24 0336 09/11/24 0224  WBC 7.7  --   --  6.5 5.0  NEUTROABS 6.1  --   --   --   --   HGB 11.9* 12.6 9.9* 10.6* 9.9*  HCT 37.6 37.0 29.0* 33.5* 31.1*  MCV 96.7  --   --  97.4 96.6  PLT 252  --   --  191 181   Basic Metabolic Panel: Recent Labs  Lab 09/09/24 1520  09/09/24 1526 09/09/24 1554 09/10/24 0336 09/11/24 0224  NA 143 144 139 142 141  K 4.2 4.1 4.3 4.0 3.6  CL 110 109 104 109 108  CO2 21*  --   --  25 23  GLUCOSE 116* 113* 97 80 118*  BUN 21 22 24* 18 16  CREATININE 1.56* 1.70* 1.70* 1.47* 1.56*  CALCIUM  9.7  --   --  9.3 8.9   GFR: Estimated Creatinine Clearance: 24.9 mL/min (A) (by C-G formula based on SCr of 1.56 mg/dL (H)). Liver Function Tests: Recent Labs  Lab 09/09/24 1520 09/10/24 0336  AST 14* 12*  ALT 6 5  ALKPHOS 81 71  BILITOT 0.3 0.3  PROT 6.8 6.3*  ALBUMIN 3.9 3.6   No results for input(s): LIPASE, AMYLASE in the last 168 hours. No results for input(s): AMMONIA in the last 168 hours. Coagulation Profile: Recent Labs  Lab 09/09/24 1520  INR 1.0   Cardiac Enzymes: No results for input(s): CKTOTAL, CKMB, CKMBINDEX, TROPONINI in the last 168 hours. BNP (last 3 results) No results for input(s): PROBNP in the last 8760 hours. HbA1C: Recent Labs    09/10/24 0336  HGBA1C 5.5   CBG: No results for input(s): GLUCAP in the last 168 hours. Lipid Profile: Recent Labs    09/10/24 0336  CHOL 138  HDL 56  LDLCALC 68  TRIG 70  CHOLHDL 2.5   Thyroid  Function Tests: No results for input(s): TSH, T4TOTAL, FREET4, T3FREE, THYROIDAB in the last 72 hours. Anemia Panel: No results for input(s): VITAMINB12, FOLATE, FERRITIN, TIBC, IRON, RETICCTPCT in the last 72 hours. Sepsis Labs: No results for input(s): PROCALCITON, LATICACIDVEN in the last 168 hours.  No results found for this or any previous visit (from the past 240 hours).   Radiology Studies: MR CERVICAL SPINE WO CONTRAST Result Date: 09/10/2024 EXAM: MRI CERVICAL SPINE WITHOUT CONTRAST 09/10/2024 12:18:00 PM TECHNIQUE: Multiplanar multisequence MRI of the cervical spine was performed. COMPARISON: Cervical spine CT 12/01/2016. CTA head and neck yesterday. CLINICAL HISTORY: 79 year old female with left upper  extremity pain, numbness, and weakness. Acute cervical spine myelopathy. FINDINGS: BONES AND ALIGNMENT: Mild cervicothoracic scoliosis. Straightening of cervical lordosis with multilevel mild degenerative appearing anterolisthesis, most pronounced at C4-C5. Normal vertebral body heights. Background marrow signal is normal. No marrow edema identified. Widespread chronic cervical facet arthropathy. SPINAL CORD: Normal spinal cord size. No abnormal spinal cord signal. SOFT TISSUES: No paraspinal mass. Preserved major vascular flow voids in the bilateral neck with tortuous vertebral arteries. Negative visible neck soft tissues and thoracic inlet. Negative cervicomedullary junction and visible cerebellum. Patchy T2 hyperintensity in the brainstem, most commonly due to chronic small vessel disease. DEGENERATIVE: C2-C3: Moderate facet hypertrophy greater on the right, moderate ligamentum flavum hypertrophy. Mild disc bulging and endplate spurring. Mild spinal stenosis. No cord mass effect. Moderate left C3 neural foraminal stenosis. C3-C4: Moderate to severe facet and ligamentum flavum hypertrophy greater on the left. Mild disc bulging and endplate spurring. Mild spinal stenosis. No spinal cord mass effect. Tortuosity of the left vertebral artery in the left C4 neural foramen, moderate superimposed left foraminal stenosis. C4-C5: Mild anterolisthesis. Circumferential disc bulging, pseudodisc. Mild endplate spurring. Severe facet and ligamentum flavum hypertrophy bilaterally. Mild spinal stenosis. No cord mass effect (series 10 image 25). Tortuous vertebral arteries in the C5 neural foramina here. Moderate bilateral foraminal stenosis suspected. C5-C6: Circumferential disc bulging and endplate spurring. Moderate facet and mild ligamentum flavum hypertrophy. No spinal stenosis. Mild C6 neural foraminal stenosis. C6-C7: Disc space loss. Circumferential disc osteophyte complex asymmetric to the right. Mild posterior element  hypertrophy. No spinal stenosis. Mild to moderate right C7 foraminal stenosis. C7-T1: Disc space loss. Circumferential disc osteophyte complex. Mild to moderate facet and ligamentum flavum hypertrophy. No spinal stenosis. Mild right C8 foraminal stenosis. No visible upper thoracic spinal stenosis. IMPRESSION: 1. Widespread chronic cervical degeneration. Mild multifactorial spinal stenosis from C2-C3 through C4-C5. No spinal cord mass effect or signal abnormality. 2. Bulky cervical facet degeneration, maximal at C4-C5 where this is mild anterolisthesis. Up to moderate associated degenerative cervical foraminal stenosis, with superimposed vertebral artery tortuosity in several of the cervical neural foramina. Electronically signed by: Helayne Hurst MD 09/10/2024 12:38 PM EST RP Workstation: [id number]   ECHOCARDIOGRAM COMPLETE BUBBLE STUDY Result Date: 09/10/2024    ECHOCARDIOGRAM REPORT   Patient Name:   NAARA KELTY Date of Exam: 09/10/2024 Medical Rec #:  [id number]  Height:       66.0 in Accession #:    [id number]      Weight:       119.0 lb Date of Birth:  October 03, 1945       BSA:          1.604 m Patient Age:    78 years        BP:           134/91 mmHg Patient Gender: F               HR:           73 bpm. Exam Location:  Inpatient Procedure: 2D Echo, Cardiac Doppler, Color Doppler, Saline Contrast Bubble Study            and Intracardiac Opacification Agent (Both Spectral and Color Flow            Doppler were utilized during procedure). Indications:    Atrial thrombus  History:        Patient has prior history of Echocardiogram examinations, most                 recent 03/18/2024.  Sonographer:    Odella Brewster Referring Phys: [id number] TIMOTHY S OPYD  Sonographer Comments: Image acquisition challenging due to respiratory motion. IMPRESSIONS  1. Echodense mass ( 1.35 cm x 2.29 cm) seen in the left atrium suspected to be a left atrial thrombus, this is not present on prior study on 03/18/2024. May benefiit from  a cardiac MR. Left atrial size was severely dilated.  2. Left ventricular ejection fraction, by estimation, is 60 to 65%. The left ventricle has normal function. The left ventricle has no regional wall motion abnormalities. There is mild asymmetric left ventricular hypertrophy of the septal segment. Left ventricular diastolic parameters were normal.  3. Right ventricular systolic function is normal. The right ventricular size is normal.  4. Right atrial size was severely dilated.  5. The mitral valve is normal in structure. Mild to moderate mitral valve regurgitation. No evidence of mitral stenosis.  6. Tricuspid valve regurgitation is mild to moderate.  7. The aortic valve is calcified. There is mild calcification of the aortic valve. There is mild thickening of the aortic valve. Aortic valve regurgitation is not visualized. Aortic valve sclerosis/calcification is present, without any evidence of aortic stenosis.  8. The inferior vena cava is normal in size with greater than 50% respiratory variability, suggesting right atrial pressure of 3 mmHg.  9. Agitated saline contrast bubble study was negative, with no evidence of any interatrial shunt. FINDINGS  Left Ventricle: Left ventricular ejection fraction, by estimation, is 60 to 65%. The left ventricle has normal function. The left ventricle has no regional wall motion abnormalities. Definity  contrast agent was given IV to delineate the left ventricular  endocardial borders. The left ventricular internal cavity size was normal in size. There is mild asymmetric left ventricular hypertrophy of the septal segment. Left ventricular diastolic parameters were normal. Right Ventricle: The right ventricular size is normal. No increase in right ventricular wall thickness. Right ventricular systolic function is normal. Left Atrium: Echodense mass ( 1.35 cm x 2.29 cm) seen in the left atrium suspected to be a left atrial thrombus, this is not present on prior study on  03/18/2024. May benefiit from a cardiac MR. Left atrial size was severely dilated. Right Atrium: Right atrial size was severely dilated. Pericardium: There is no evidence of pericardial effusion. Mitral Valve: The mitral valve is  normal in structure. Mild to moderate mitral valve regurgitation. No evidence of mitral valve stenosis. MV peak gradient, 7.4 mmHg. The mean mitral valve gradient is 2.0 mmHg. Tricuspid Valve: The tricuspid valve is normal in structure. Tricuspid valve regurgitation is mild to moderate. No evidence of tricuspid stenosis. Aortic Valve: The aortic valve is calcified. There is mild calcification of the aortic valve. There is mild thickening of the aortic valve. There is moderate aortic valve annular calcification. Aortic valve regurgitation is not visualized. Aortic valve sclerosis/calcification is present, without any evidence of aortic stenosis. Aortic valve mean gradient measures 3.0 mmHg. Aortic valve peak gradient measures 5.6 mmHg. Aortic valve area, by VTI measures 2.91 cm. Pulmonic Valve: The pulmonic valve was normal in structure. Pulmonic valve regurgitation is not visualized. No evidence of pulmonic stenosis. Aorta: The aortic root is normal in size and structure. Venous: The inferior vena cava is normal in size with greater than 50% respiratory variability, suggesting right atrial pressure of 3 mmHg. IAS/Shunts: No atrial level shunt detected by color flow Doppler. Agitated saline contrast was given intravenously to evaluate for intracardiac shunting. Agitated saline contrast bubble study was negative, with no evidence of any interatrial shunt.  LEFT VENTRICLE PLAX 2D LVIDd:         3.10 cm     Diastology LVIDs:         2.20 cm     LV e' medial:    7.72 cm/s LV PW:         0.80 cm     LV E/e' medial:  17.9 LV IVS:        1.00 cm     LV e' lateral:   7.51 cm/s LVOT diam:     2.20 cm     LV E/e' lateral: 18.4 LV SV:         63 LV SV Index:   40 LVOT Area:     3.80 cm LV IVRT:        74 msec  LV Volumes (MOD) LV vol d, MOD A2C: 52.4 ml LV vol d, MOD A4C: 45.3 ml LV vol s, MOD A2C: 13.0 ml LV vol s, MOD A4C: 12.5 ml LV SV MOD A2C:     39.4 ml LV SV MOD A4C:     45.3 ml LV SV MOD BP:      36.9 ml RIGHT VENTRICLE             IVC RV S prime:     10.00 cm/s  IVC diam: 1.60 cm TAPSE (M-mode): 1.9 cm                             PULMONARY VEINS                             Diastolic Velocity: 47.20 cm/s                             S/D Velocity:       0.90                             Systolic Velocity:  42.10 cm/s LEFT ATRIUM              Index        RIGHT ATRIUM  Index LA diam:        4.50 cm  2.81 cm/m   RA Area:     28.10 cm LA Vol (A2C):   108.0 ml 67.34 ml/m  RA Volume:   93.10 ml  58.05 ml/m LA Vol (A4C):   134.0 ml 83.55 ml/m LA Biplane Vol: 120.0 ml 74.82 ml/m  AORTIC VALVE                    PULMONIC VALVE AV Area (Vmax):    2.66 cm     PV Vmax:          0.90 m/s AV Area (Vmean):   2.63 cm     PV Peak grad:     3.3 mmHg AV Area (VTI):     2.91 cm     PR End Diast Vel: 9.86 msec AV Vmax:           118.00 cm/s AV Vmean:          80.400 cm/s AV VTI:            0.218 m AV Peak Grad:      5.6 mmHg AV Mean Grad:      3.0 mmHg LVOT Vmax:         82.50 cm/s LVOT Vmean:        55.600 cm/s LVOT VTI:          0.167 m LVOT/AV VTI ratio: 0.77  AORTA Ao Root diam: 3.70 cm MITRAL VALVE                TRICUSPID VALVE MV Area (PHT): 4.44 cm     TR Peak grad:   36.5 mmHg MV Area VTI:   2.56 cm     TR Vmax:        302.00 cm/s MV Peak grad:  7.4 mmHg MV Mean grad:  2.0 mmHg     SHUNTS MV Vmax:       1.36 m/s     Systemic VTI:  0.17 m MV Vmean:      59.3 cm/s    Systemic Diam: 2.20 cm MV Decel Time: 171 msec MV E velocity: 138.00 cm/s MV A velocity: 42.10 cm/s MV E/A ratio:  3.28 Kardie Tobb DO Electronically signed by Dub Huntsman DO Signature Date/Time: 09/10/2024/10:36:22 AM    Final    MR BRAIN WO CONTRAST Result Date: 09/09/2024 EXAM: MRI BRAIN WITHOUT CONTRAST 09/09/2024 06:49:00 PM  TECHNIQUE: Multiplanar multisequence MRI of the head/brain was performed without the administration of intravenous contrast. COMPARISON: Head CT and CTA 09/09/2024 and MRI 12/13/2022. CLINICAL HISTORY: Neuro deficit, acute, stroke suspected. Left arm pain and numbness. FINDINGS: BRAIN AND VENTRICLES: There is no evidence of an acute infarct, intracranial hemorrhage, mass, midline shift, hydrocephalus, or extra-axial fluid collection. There is mild cerebral atrophy. T2 hyperintensities in the cerebral white matter and pons are similar to the prior MRI and nonspecific but compatible with mild chronic small vessel ischemic disease. Major intracranial vascular flow voids are preserved. Small right ICA aneurysm, evaluated in greater detail on today's earlier CTA. ORBITS: Bilateral cataract extraction. SINUSES AND MASTOIDS: No significant abnormality. BONES AND SOFT TISSUES: Small left frontal scalp lipoma. IMPRESSION: 1. No acute intracranial abnormality. 2. Mild chronic small vessel ischemic disease. Electronically signed by: Dasie Hamburg MD 09/09/2024 07:18 PM EST RP Workstation: [id number]   CT ANGIO CODE STROKE CHEST/ABD/PEL FOR DISSECTION W &/OR WO CONTRAST Result Date: 09/09/2024 CLINICAL DATA:  Neurologic deficit. Concern for aortic dissection.  Weakness. EXAM: CT ANGIOGRAPHY CHEST, ABDOMEN AND PELVIS TECHNIQUE: Non-contrast CT of the chest was initially obtained. Multidetector CT imaging through the chest, abdomen and pelvis was performed using the standard protocol during bolus administration of intravenous contrast. Multiplanar reconstructed images and MIPs were obtained and reviewed to evaluate the vascular anatomy. RADIATION DOSE REDUCTION: This exam was performed according to the departmental dose-optimization program which includes automated exposure control, adjustment of the mA and/or kV according to patient size and/or use of iterative reconstruction technique. CONTRAST:  OMNIPAQUE  IOHEXOL  350  MG/ML SOLN COMPARISON:  Chest CT dated 05/28/2024. FINDINGS: CTA CHEST FINDINGS Cardiovascular: There is mild cardiomegaly with biatrial dilatation. A 14 x 7 mm lesion in the left atrium is not characterized on this CT but may represent a blood clot, or a primary cardiac tumor such as myxoma. Further evaluation with echocardiogram or MRI without and with contrast recommended. No pericardial effusion. There is coronary vascular calcification. There is moderate atherosclerotic calcification of the thoracic aorta. No aneurysmal dilatation or dissection. The origins of the great vessels of the aortic arch are patent. No pulmonary artery embolus identified. Mediastinum/Nodes: No hilar or mediastinal adenopathy. There is a moderate size hiatal hernia containing a portion of the spine. The esophagus is grossly unremarkable. No mediastinal fluid collection. Lungs/Pleura: Stable area of consolidation in the right middle lobe, likely scarring. Similar appearance of a cluster of nodularity in the left upper lobe with the largest nodule measuring up to 6 mm. Several additional faint ground-glass nodules in the left lower lobe measure up to 6 mm and similar to prior CT. Additional ground-glass nodule in the right upper lobe measure up to 5 mm and also similar to prior CT. There is trace right pleural effusion. No new consolidation. No pneumothorax. The central airways are patent. Musculoskeletal: Osteopenia with degenerative changes of the spine. No acute osseous pathology. Review of the MIP images confirms the above findings. CTA ABDOMEN AND PELVIS FINDINGS VASCULAR Aorta: Advanced atherosclerotic calcification of the abdominal aorta. No aneurysmal dilatation or dissection. Celiac: The celiac trunk and its major branches are patent. SMA: The SMA is patent. Renals: Atherosclerotic calcification of the left renal artery. 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 The left renal artery remains patent. IMA: The IMA  is patent. Inflow: Moderate atherosclerotic calcification of the iliac arteries. No aneurysmal dilatation or dissection. The iliac arteries are patent. Veins: No obvious venous abnormality within the limitations of this arterial phase study. Review of the MIP images confirms the above findings. NON-VASCULAR No intra-abdominal free air or free fluid. Hepatobiliary: Subcentimeter hepatic hypodense lesions are too small to characterize. Cholecystectomy. Dilatation of the common bile duct measuring 16 mm in diameter. No retained calcified stone noted in the central CBD. Pancreas: Unremarkable. No pancreatic ductal dilatation or surrounding inflammatory changes. Spleen: Normal in size without focal abnormality. Adrenals/Urinary Tract: The adrenal glands unremarkable. Solitary left kidney. There is mild left hydronephrosis. Extrarenal left pelvis with mild pelviectasis. There is mild left hydroureter. No obstructing stone. Underlying urothelial lesion at the level of the left ureterovesical junction as the cause of obstruction is not excluded. The urinary bladder is minimally distended. There is thickened and trabeculated appearance of the bladder wall which may be related to chronic bladder dysfunction. An infiltrative process is not excluded. Cystoscopy may provide better evaluation. There is perivesical stranding. Correlation with urinalysis recommended to exclude cystitis. Stomach/Bowel: There is no bowel obstruction or active inflammation. The appendix is not visualized  with certainty. No inflammatory changes identified in the right lower quadrant. Lymphatic: No adenopathy. Reproductive: Hysterectomy. Other: None Musculoskeletal: Osteopenia with degenerative changes of the spine. Lower lumbar posterior fusion. Total right hip arthroplasty. No acute osseous pathology. Review of the MIP images confirms the above findings. IMPRESSION: 1. No aortic dissection or aneurysm. 2. A 14 x 7 mm lesion in the left atrium may  represent a blood clot, or a primary cardiac tumor such as myxoma. Further evaluation with echocardiogram or MRI without and with contrast recommended. 3. Mild left hydronephrosis and hydroureter. No obstructing stone. Underlying urothelial lesion at the level of the left ureterovesical junction as the cause of obstruction is not excluded. Further evaluation with cystoscopy is recommended. 4. Thickened and trabeculated appearance of the bladder wall may be related to chronic bladder dysfunction. An infiltrative process is not excluded. Cystoscopy may provide better evaluation. 5. Perivesical stranding. Correlation with urinalysis recommended to exclude cystitis. 6. 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 7. No bowel obstruction. 8.  Aortic Atherosclerosis (ICD10-I70.0). Electronically Signed   By: Vanetta Chou M.D.   On: 09/09/2024 16:14   CT ANGIO HEAD NECK W WO CM Result Date: 09/09/2024 EXAM: CTA HEAD AND NECK WITH AND WITHOUT 09/09/2024 03:43:00 PM TECHNIQUE: CTA of the head and neck was performed with and without the administration of 125 mL of intravenous contrast (iohexol  (OMNIPAQUE ) 350 MG/ML injection 150 mL IOHEXOL  350 MG/ML SOLN). Multiplanar 2D and/or 3D reformatted images are provided for review. Automated exposure control, iterative reconstruction, and/or weight based adjustment of the mA/kV was utilized to reduce the radiation dose to as low as reasonably achievable. Stenosis of the internal carotid arteries measured using NASCET criteria. COMPARISON: CT head dated 09/09/2024 03:43:00 PM CLINICAL HISTORY: Stroke/TIA, determine embolic source. FINDINGS: CTA NECK: AORTIC ARCH AND ARCH VESSELS: Atherosclerosis of the visualized aortic arch and aortic arch vessel origins. Mild stenosis at the origin of the left subclavian artery. No dissection or arterial injury. CERVICAL CAROTID ARTERIES: Mild atherosclerosis of the right carotid bifurcation without hemodynamically significant stenosis of  the mid right cervical ICA. Mild atherosclerosis along the mid left common carotid artery and at the left carotid bifurcation without hemodynamically significant stenosis. Prominent tortuosity of the mid and distal left cervical ICA. Multiple areas of irregularity with subtle narrowing and outpouching of the left cervical ICA which may reflect fibromuscular dysplasia. No dissection or arterial injury. CERVICAL VERTEBRAL ARTERIES: Tortuosity of the V1 segment of the right vertebral artery. No dissection, arterial injury, or significant stenosis. LUNGS AND MEDIASTINUM: Incompletely visualized 1.0 cm nodule in the anterior left upper lobe seen on series 6 image 13. The remainder of the visualized lungs and mediastinum are unremarkable. SOFT TISSUES: Edentulous maxilla and mandible. Degenerative changes of the left temporomandibular joint. No acute abnormality. BONES: Degenerative changes throughout the visualized spine. Disc space narrowing is greatest at C6-C7. No acute abnormality. CTA HEAD: ANTERIOR CIRCULATION: Atherosclerosis of the carotid siphons without significant stenosis. There is a 5 x 4 x 5 mm laterally and slightly posteriorly directed outpouching along the anterior genia of the right cavernous ICA/right paraophthalmic ICA (series 9 image 105). There is an additional 1.5 mm superiorly directed outpouching along the right paraophthalmic ICA (series 9 image 101). 2.5 mm inferiorly directed outpouching along the left supraclinoid ICA which may reflect an infundibulum at the posterior communicating artery origin versus small aneurysm. No significant stenosis of the anterior cerebral arteries. No significant stenosis of the middle cerebral arteries. POSTERIOR  CIRCULATION: Fetal origin of the right PCA. No significant stenosis of the posterior cerebral arteries. No significant stenosis of the basilar artery. No significant stenosis of the vertebral arteries. No aneurysm. OTHER: No dural venous sinus thrombosis  on this non-dedicated study. IMPRESSION: 1. No acute large vessel occlusion. 2.  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 3. 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 4. 2.5 mm outpouching along the left supraclinoid ICA, possibly representing an infundibulum versus small aneurysm. 5. Prominent tortuosity of the mid and distal left cervical ICA with multiple areas of irregularity, subtle narrowing, and outpouching, possibly reflecting fibromuscular dysplasia. 6. Mild atherosclerosis as above. 7. Incompletely visualized 1.0 cm left upper lobe pulmonary nodule, with prompt non-contrast chest CT for full evaluation recommended per Fleischner Society Guidelines. Electronically signed by: Donnice Mania MD 09/09/2024 04:13 PM EST RP Workstation: [id number]   CT HEAD CODE STROKE WO CONTRAST (LKW 0-4.5h, LVO 0-24h) Result Date: 09/09/2024 EXAM: CT HEAD WITHOUT CONTRAST 09/09/2024 03:30:49 PM TECHNIQUE: CT of the head was performed without the administration of intravenous contrast. Automated exposure control, iterative reconstruction, and/or weight based adjustment of the mA/kV was utilized to reduce the radiation dose to as low as reasonably achievable. COMPARISON: Head CT and MRI 12/13/2022. CLINICAL HISTORY: Neuro deficit, acute, stroke suspected. Left arm pain and weakness. FINDINGS: BRAIN AND VENTRICLES: There is no evidence of an acute infarct, intracranial hemorrhage, mass, midline shift, hydrocephalus, or extra-axial fluid collection. There is mild cerebral atrophy. Cerebral white matter hypodensities are similar to the prior CT and nonspecific but compatible with mild chronic small vessel ischemic disease. Calcified atherosclerosis at the skull base. ORBITS: Bilateral cataract extraction. SINUSES: No acute abnormality. SOFT TISSUES AND SKULL: Small left frontal scalp lipoma. No skull fracture. [place] Stroke Program Early CT Score (ASPECTS) -----  Ganglionic (caudate, IC, lentiform nucleus, insula, M1-M3): 7 Supraganglionic (M4-M6): 3 Total: 10 IMPRESSION: 1. No acute intracranial abnormality. ASPECTS of 10. 2. Mild chronic small vessel ischemic disease. 3. These results were communicated to Dr. FREDRIK Hock at 3:34pm on 09/09/2024 by secure text page via the [facility] messaging system. Electronically signed by: Dasie Hamburg MD 09/09/2024 03:35 PM EST RP Workstation: [id number]    Scheduled Meds:  atorvastatin   10 mg Oral QHS   bisoprolol   2.5 mg Oral Daily   buPROPion   75 mg Oral q morning   FLUoxetine   20 mg Oral QHS   gabapentin   600 mg Oral BID   levothyroxine   88 mcg Oral Q0600   pantoprazole   40 mg Oral BID AC   Continuous Infusions:  cefTRIAXone  (ROCEPHIN )  IV 1 g (09/10/24 1825)   heparin  1,050 Units/hr (09/10/24 1929)     LOS: 1 day   Fredia Skeeter, MD [facility]  09/11/2024, 7:59 AM   *Please note that this is a verbal dictation therefore any spelling or grammatical errors are due to the Dragon Medical One system interpretation.  Please page via Amion and do not message via secure chat for urgent patient care matters. Secure chat can be used for non urgent patient care matters.  How to contact the [facility] Attending or Consulting provider 7A - 7P or covering provider during after hours 7P -7A, for this patient?  Check the care team in [facility] and look for a) attending/consulting [facility] provider listed and b) the [facility] team listed. Page or secure chat 7A-7P. Log into [email or web address] and use [place]'s universal password to access. If you do not have the password, please  contact the hospital operator. Locate the [facility] provider you are looking for under [facility] and page to a number that you can be directly reached. If you still have difficulty reaching the provider, please page the [facility] (Director on Call) for the Hospitalists listed on amion for assistance.  "

## 2024-09-11 NOTE — Plan of Care (Signed)
" °  Problem: Ischemic Stroke/TIA Tissue Perfusion: Goal: Complications of ischemic stroke/TIA will be minimized Outcome: Progressing   Problem: Nutrition: Goal: Risk of aspiration will decrease Outcome: Progressing   Problem: Nutrition: Goal: Dietary intake will improve Outcome: Progressing   Problem: Health Behavior/Discharge Planning: Goal: Ability to manage health-related needs will improve Outcome: Progressing   Problem: Clinical Measurements: Goal: Will remain free from infection Outcome: Progressing   Problem: Clinical Measurements: Goal: Diagnostic test results will improve Outcome: Progressing   Problem: Activity: Goal: Risk for activity intolerance will decrease Outcome: Progressing   Problem: Nutrition: Goal: Adequate nutrition will be maintained Outcome: Progressing   "

## 2024-09-11 NOTE — Progress Notes (Signed)
 SLP Cancellation Note  Patient Details Name: [name] MRN: [id number] DOB: [birth year]   Cancelled treatment:       Reason Eval/Treat Not Completed: SLP screened, no needs identified, will sign off  Rea Pass MA, CCC-SLP  Tajae Maiolo Meryl 09/11/2024, 9:19 AM
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
## 2024-09-12 ENCOUNTER — Other Ambulatory Visit (HOSPITAL_COMMUNITY): Payer: Self-pay

## 2024-09-12 ENCOUNTER — Telehealth (HOSPITAL_COMMUNITY): Payer: Self-pay

## 2024-09-12 DIAGNOSIS — R200 Anesthesia of skin: Secondary | ICD-10-CM | POA: Diagnosis not present

## 2024-09-12 LAB — BASIC METABOLIC PANEL WITH GFR
Anion gap: 11 (ref 5–15)
BUN: 13 mg/dL (ref 8–23)
CO2: 18 mmol/L — ABNORMAL LOW (ref 22–32)
Calcium: 9.8 mg/dL (ref 8.9–10.3)
Chloride: 109 mmol/L (ref 98–111)
Creatinine, Ser: 1.32 mg/dL — ABNORMAL HIGH (ref 0.44–1.00)
GFR, Estimated: 41 mL/min — ABNORMAL LOW
Glucose, Bld: 117 mg/dL — ABNORMAL HIGH (ref 70–99)
Potassium: 4.6 mmol/L (ref 3.5–5.1)
Sodium: 138 mmol/L (ref 135–145)

## 2024-09-12 LAB — CBC
HCT: 34.8 % — ABNORMAL LOW (ref 36.0–46.0)
Hemoglobin: 11.1 g/dL — ABNORMAL LOW (ref 12.0–15.0)
MCH: 31.1 pg (ref 26.0–34.0)
MCHC: 31.9 g/dL (ref 30.0–36.0)
MCV: 97.5 fL (ref 80.0–100.0)
Platelets: 178 K/uL (ref 150–400)
RBC: 3.57 MIL/uL — ABNORMAL LOW (ref 3.87–5.11)
RDW: 14.1 % (ref 11.5–15.5)
WBC: 5.1 K/uL (ref 4.0–10.5)
nRBC: 0 % (ref 0.0–0.2)

## 2024-09-12 LAB — MAGNESIUM: Magnesium: 2.5 mg/dL — ABNORMAL HIGH (ref 1.7–2.4)

## 2024-09-12 MED ORDER — RIVAROXABAN 15 MG PO TABS: 15.0000 mg | ORAL_TABLET | Freq: Every day | ORAL | 0 refills | Status: AC

## 2024-09-12 MED ORDER — APIXABAN 2.5 MG PO TABS
2.5000 mg | ORAL_TABLET | Freq: Two times a day (BID) | ORAL | Status: DC
  Administered 2024-09-12: 2.5 mg via ORAL
  Filled 2024-09-12: qty 1

## 2024-09-12 MED ORDER — APIXABAN 2.5 MG PO TABS
2.5000 mg | ORAL_TABLET | Freq: Two times a day (BID) | ORAL | 0 refills | Status: DC
  Filled 2024-09-12: qty 60, 30d supply, fill #0

## 2024-09-12 MED ORDER — APIXABAN 5 MG PO TABS: 5.0000 mg | ORAL_TABLET | Freq: Two times a day (BID) | ORAL | Status: DC

## 2024-09-12 MED ORDER — OXYCODONE HCL 5 MG PO TABS: 5.0000 mg | ORAL_TABLET | Freq: Four times a day (QID) | ORAL | 0 refills | Status: AC | PRN

## 2024-09-12 MED ORDER — OXYCODONE HCL 5 MG PO TABS
5.0000 mg | ORAL_TABLET | Freq: Four times a day (QID) | ORAL | 0 refills | Status: DC | PRN
  Filled 2024-09-12: qty 15, 4d supply, fill #0

## 2024-09-12 NOTE — TOC Transition Note (Signed)
 Transition of Care [facility]) - Discharge Note   Patient Details  Name: SUMAYYAH CUSTODIO MRN: [id number] Date of Birth: 11/16/1945  [facility] CM/SW Contact:  [name], RN Phone Number: 09/12/2024, 10:09 AM   Clinical Narrative:     Pt is discharging home with home health services through [place]. Information on the AVS.  Pt has transportation home.  Final next level of care: Home w Home Health Services Barriers to Discharge: No Barriers Identified   Patient Goals and CMS Choice   CMS Medicare.gov Compare Post Acute Care list provided to:: Patient Choice offered to / list presented to : Patient      Discharge Placement                       Discharge Plan and Services Additional resources added to the After Visit Summary for     Discharge Planning Services: CM Consult Post Acute Care Choice: Home Health                    HH Arranged: PT, OT [facility] Agency: [facility] Date [facility] Agency Contacted: 09/11/24   Representative spoke with at [facility] Agency: Darleene  Social Drivers of Health (SDOH) Interventions SDOH Screenings   Food Insecurity: No Food Insecurity (09/11/2024)  Housing: Low Risk (09/11/2024)  Transportation Needs: No Transportation Needs (09/11/2024)  Utilities: Not At Risk (09/11/2024)  Depression (PHQ2-9): Low Risk (02/28/2023)  Social Connections: Moderately Isolated (09/11/2024)  Tobacco Use: Medium Risk (09/09/2024)     Readmission Risk Interventions    01/27/2024   10:41 AM 01/03/2024   12:11 PM 05/23/2023   10:20 AM  Readmission Risk Prevention Plan  Transportation Screening Complete Complete Complete  PCP or Specialist Appt within 5-7 Days Complete  Complete  PCP or Specialist Appt within 3-5 Days  Complete   Home Care Screening Complete  Complete  Medication Review (RN CM) Complete  Complete  HRI or Home Care Consult  Complete   Social Work Consult for Recovery Care Planning/Counseling  Complete   Palliative Care  Screening  Complete   Medication Review Ocean[employer])  Complete

## 2024-09-12 NOTE — Telephone Encounter (Addendum)
 Pharmacy Patient Advocate Encounter  Insurance verification completed.    The patient is insured through [facility]. Patient has Medicare and is not eligible for a copay card, but may be able to apply for patient assistance or Medicare RX Payment Plan (Patient Must reach out to their plan, if eligible for payment plan), if available.    Ran test claim for Eliquis  5mg  tablet and the current 30 day co-pay is $258.70 due to deductible.  Ran test claim for Pradaxa 150mg  capsules and the current 30 day co-pay is $141.71 due to deductible.  Ran test claim for Xarelto 20mg  tablet and the current 30 day co-pay is $216.43 due to deductible.  This test claim was processed through [facility]- copay amounts may vary at other pharmacies due to pharmacy/plan contracts, or as the patient moves through the different stages of their insurance plan.

## 2024-09-12 NOTE — Progress Notes (Signed)
 "   DAILY PROGRESS NOTE   Patient Name: [name] Date of Encounter: 09/12/2024 Cardiologist: Peter Jordan, MD  Chief Complaint   No complaints  Patient Profile   [name] is a 79 y.o. female with a hx of hypertension, hyperlipidemia, hypothyroidism, history of CVA in 2014, non-small cell lung cancer stage Ia, CKD stage IIIa, anemia from urinary bleed, Takotsubo cardiomyopathy, chronic HFpEF, and atrial fibrillation who is being seen 09/10/2024 for the evaluation of atrial fibrillation at the request of Delon Hoe.   Subjective   No issues overnight. Still having PVC's, but less. No NSVT. Magnesium  repleted yesterday - repeat labs not drawn today. Hemoglobin has been stable.  Objective   Vitals:   09/11/24 1703 09/11/24 2024 09/11/24 2317 09/12/24 0342  BP: (!) 141/93 (!) 105/59 131/79 130/67  Pulse:  78 66 64  Resp:  18 18 18   Temp:  97.9 F (36.6 C) 97.7 F (36.5 C) (!) 97.5 F (36.4 C)  TempSrc:  Oral Oral Oral  SpO2:  97% 97% 98%  Weight:      Height:        Intake/Output Summary (Last 24 hours) at 09/12/2024 9288 Last data filed at 09/11/2024 2247 Gross per 24 hour  Intake 838.9 ml  Output --  Net 838.9 ml   Filed Weights   09/09/24 1533 09/11/24 1032  Weight: 54 kg 54 kg    Physical Exam   General appearance: alert and no distress Lungs: clear to auscultation bilaterally Heart: irregularly irregular rhythm Extremities: extremities normal, atraumatic, no cyanosis or edema Neurologic: Mental status: Alert, oriented, thought content appropriate, 4/5 LUE strength  Inpatient Medications    Scheduled Meds:  atorvastatin   10 mg Oral QHS   bisoprolol   2.5 mg Oral Daily   buPROPion   75 mg Oral q morning   FLUoxetine   20 mg Oral QHS   gabapentin   600 mg Oral BID   levothyroxine   88 mcg Oral Q0600   pantoprazole   40 mg Oral BID AC    Continuous Infusions:  cefTRIAXone  (ROCEPHIN )  IV Stopped (09/11/24 1730)   heparin  1,050 Units/hr  (09/12/24 0529)    PRN Meds: acetaminophen  **OR** acetaminophen  (TYLENOL ) oral liquid 160 mg/5 mL **OR** acetaminophen , albuterol , fentaNYL  (SUBLIMAZE ) injection, oxyCODONE , prochlorperazine , senna-docusate   Labs   Results for orders placed or performed during the hospital encounter of 09/09/24 (from the past 48 hours)  Heparin  level (unfractionated)     Status: Abnormal   Collection Time: 09/10/24  5:49 PM  Result Value Ref Range   Heparin  Unfractionated 0.20 (L) 0.30 - 0.70 IU/mL    Comment: (NOTE) The clinical reportable range upper limit is being lowered to >1.10 to align with the FDA approved guidance for the current laboratory assay.  If heparin  results are below expected values, and patient dosage has  been confirmed, suggest follow up testing of antithrombin III levels. Performed at [facility], [address] [place], [place] [address]   CBC     Status: Abnormal   Collection Time: 09/11/24  2:24 AM  Result Value Ref Range   WBC 5.0 4.0 - 10.5 K/uL   RBC 3.22 (L) 3.87 - 5.11 MIL/uL   Hemoglobin 9.9 (L) 12.0 - 15.0 g/dL   HCT 68.8 (L) 63.9 - 53.9 %   MCV 96.6 80.0 - 100.0 fL   MCH 30.7 26.0 - 34.0 pg   MCHC 31.8 30.0 - 36.0 g/dL   RDW 86.0 88.4 - 84.4 %  Platelets 181 150 - 400 K/uL   nRBC 0.0 0.0 - 0.2 %    Comment: Performed at [facility], [address] [place], [place] [address]  Basic metabolic panel with GFR     Status: Abnormal   Collection Time: 09/11/24  2:24 AM  Result Value Ref Range   Sodium 141 135 - 145 mmol/L   Potassium 3.6 3.5 - 5.1 mmol/L   Chloride 108 98 - 111 mmol/L   CO2 23 22 - 32 mmol/L   Glucose, Bld 118 (H) 70 - 99 mg/dL    Comment: Glucose reference range applies only to samples taken after fasting for at least 8 hours.   BUN 16 8 - 23 mg/dL   Creatinine, Ser 8.43 (H) 0.44 - 1.00 mg/dL   Calcium  8.9 8.9 - 10.3 mg/dL   GFR, Estimated 33 (L) >60 mL/min    Comment: (NOTE) Calculated using the CKD-EPI Creatinine  Equation (2021)    Anion gap 10 5 - 15    Comment: Performed at [facility], [address], [place], [place] [address]  Heparin  level (unfractionated)     Status: None   Collection Time: 09/11/24  2:24 AM  Result Value Ref Range   Heparin  Unfractionated 0.33 0.30 - 0.70 IU/mL    Comment: (NOTE) The clinical reportable range upper limit is being lowered to >1.10 to align with the FDA approved guidance for the current laboratory assay.  If heparin  results are below expected values, and patient dosage has  been confirmed, suggest follow up testing of antithrombin III levels. Performed at [facility], [address], [place], [place] [address]   Magnesium      Status: Abnormal   Collection Time: 09/11/24  2:24 AM  Result Value Ref Range   Magnesium  1.5 (L) 1.7 - 2.4 mg/dL    Comment: Performed at [facility], [address] [place], [place] [address]    ECG   N/A  Telemetry   Afib with PVC's and runs of NSVT - Personally Reviewed  Radiology    MR CERVICAL SPINE WO CONTRAST Result Date: 09/10/2024 EXAM: MRI CERVICAL SPINE WITHOUT CONTRAST 09/10/2024 12:18:00 PM TECHNIQUE: Multiplanar multisequence MRI of the cervical spine was performed. COMPARISON: Cervical spine CT 12/01/2016. CTA head and neck yesterday. CLINICAL HISTORY: 79 year old female with left upper extremity pain, numbness, and weakness. Acute cervical spine myelopathy. FINDINGS: BONES AND ALIGNMENT: Mild cervicothoracic scoliosis. Straightening of cervical lordosis with multilevel mild degenerative appearing anterolisthesis, most pronounced at C4-C5. Normal vertebral body heights. Background marrow signal is normal. No marrow edema identified. Widespread chronic cervical facet arthropathy. SPINAL CORD: Normal spinal cord size. No abnormal spinal cord signal. SOFT TISSUES: No paraspinal mass. Preserved major vascular flow voids in the bilateral neck with tortuous vertebral arteries. Negative visible  neck soft tissues and thoracic inlet. Negative cervicomedullary junction and visible cerebellum. Patchy T2 hyperintensity in the brainstem, most commonly due to chronic small vessel disease. DEGENERATIVE: C2-C3: Moderate facet hypertrophy greater on the right, moderate ligamentum flavum hypertrophy. Mild disc bulging and endplate spurring. Mild spinal stenosis. No cord mass effect. Moderate left C3 neural foraminal stenosis. C3-C4: Moderate to severe facet and ligamentum flavum hypertrophy greater on the left. Mild disc bulging and endplate spurring. Mild spinal stenosis. No spinal cord mass effect. Tortuosity of the left vertebral artery in the left C4 neural foramen, moderate superimposed left foraminal stenosis. C4-C5: Mild anterolisthesis. Circumferential disc bulging, pseudodisc. Mild endplate spurring. Severe facet and ligamentum  flavum hypertrophy bilaterally. Mild spinal stenosis. No cord mass effect (series 10 image 25). Tortuous vertebral arteries in the C5 neural foramina here. Moderate bilateral foraminal stenosis suspected. C5-C6: Circumferential disc bulging and endplate spurring. Moderate facet and mild ligamentum flavum hypertrophy. No spinal stenosis. Mild C6 neural foraminal stenosis. C6-C7: Disc space loss. Circumferential disc osteophyte complex asymmetric to the right. Mild posterior element hypertrophy. No spinal stenosis. Mild to moderate right C7 foraminal stenosis. C7-T1: Disc space loss. Circumferential disc osteophyte complex. Mild to moderate facet and ligamentum flavum hypertrophy. No spinal stenosis. Mild right C8 foraminal stenosis. No visible upper thoracic spinal stenosis. IMPRESSION: 1. Widespread chronic cervical degeneration. Mild multifactorial spinal stenosis from C2-C3 through C4-C5. No spinal cord mass effect or signal abnormality. 2. Bulky cervical facet degeneration, maximal at C4-C5 where this is mild anterolisthesis. Up to moderate associated degenerative cervical  foraminal stenosis, with superimposed vertebral artery tortuosity in several of the cervical neural foramina. Electronically signed by: Helayne Hurst MD 09/10/2024 12:38 PM EST RP Workstation: [id number]   ECHOCARDIOGRAM COMPLETE BUBBLE STUDY Result Date: 09/10/2024    ECHOCARDIOGRAM REPORT   Patient Name:   JANELL KEELING Date of Exam: 09/10/2024 Medical Rec #:  [id number]       Height:       66.0 in Accession #:    [id number]      Weight:       119.0 lb Date of Birth:  [birth year]       BSA:          1.604 m Patient Age:    78 years        BP:           134/91 mmHg Patient Gender: F               HR:           73 bpm. Exam Location:  Inpatient Procedure: 2D Echo, Cardiac Doppler, Color Doppler, Saline Contrast Bubble Study            and Intracardiac Opacification Agent (Both Spectral and Color Flow            Doppler were utilized during procedure). Indications:    Atrial thrombus  History:        Patient has prior history of Echocardiogram examinations, most                 recent 03/18/2024.  Sonographer:    Odella Brewster Referring Phys: [id number] TIMOTHY S OPYD  Sonographer Comments: Image acquisition challenging due to respiratory motion. IMPRESSIONS  1. Echodense mass ( 1.35 cm x 2.29 cm) seen in the left atrium suspected to be a left atrial thrombus, this is not present on prior study on 03/18/2024. May benefiit from a cardiac MR. Left atrial size was severely dilated.  2. Left ventricular ejection fraction, by estimation, is 60 to 65%. The left ventricle has normal function. The left ventricle has no regional wall motion abnormalities. There is mild asymmetric left ventricular hypertrophy of the septal segment. Left ventricular diastolic parameters were normal.  3. Right ventricular systolic function is normal. The right ventricular size is normal.  4. Right atrial size was severely dilated.  5. The mitral valve is normal in structure. Mild to moderate mitral valve regurgitation. No evidence of mitral stenosis.   6. Tricuspid valve regurgitation is mild to moderate.  7. The aortic valve is calcified. There is mild calcification of the aortic valve. There is mild thickening of the aortic valve.  Aortic valve regurgitation is not visualized. Aortic valve sclerosis/calcification is present, without any evidence of aortic stenosis.  8. The inferior vena cava is normal in size with greater than 50% respiratory variability, suggesting right atrial pressure of 3 mmHg.  9. Agitated saline contrast bubble study was negative, with no evidence of any interatrial shunt. FINDINGS  Left Ventricle: Left ventricular ejection fraction, by estimation, is 60 to 65%. The left ventricle has normal function. The left ventricle has no regional wall motion abnormalities. Definity  contrast agent was given IV to delineate the left ventricular  endocardial borders. The left ventricular internal cavity size was normal in size. There is mild asymmetric left ventricular hypertrophy of the septal segment. Left ventricular diastolic parameters were normal. Right Ventricle: The right ventricular size is normal. No increase in right ventricular wall thickness. Right ventricular systolic function is normal. Left Atrium: Echodense mass ( 1.35 cm x 2.29 cm) seen in the left atrium suspected to be a left atrial thrombus, this is not present on prior study on 03/18/2024. May benefiit from a cardiac MR. Left atrial size was severely dilated. Right Atrium: Right atrial size was severely dilated. Pericardium: There is no evidence of pericardial effusion. Mitral Valve: The mitral valve is normal in structure. Mild to moderate mitral valve regurgitation. No evidence of mitral valve stenosis. MV peak gradient, 7.4 mmHg. The mean mitral valve gradient is 2.0 mmHg. Tricuspid Valve: The tricuspid valve is normal in structure. Tricuspid valve regurgitation is mild to moderate. No evidence of tricuspid stenosis. Aortic Valve: The aortic valve is calcified. There is mild  calcification of the aortic valve. There is mild thickening of the aortic valve. There is moderate aortic valve annular calcification. Aortic valve regurgitation is not visualized. Aortic valve sclerosis/calcification is present, without any evidence of aortic stenosis. Aortic valve mean gradient measures 3.0 mmHg. Aortic valve peak gradient measures 5.6 mmHg. Aortic valve area, by VTI measures 2.91 cm. Pulmonic Valve: The pulmonic valve was normal in structure. Pulmonic valve regurgitation is not visualized. No evidence of pulmonic stenosis. Aorta: The aortic root is normal in size and structure. Venous: The inferior vena cava is normal in size with greater than 50% respiratory variability, suggesting right atrial pressure of 3 mmHg. IAS/Shunts: No atrial level shunt detected by color flow Doppler. Agitated saline contrast was given intravenously to evaluate for intracardiac shunting. Agitated saline contrast bubble study was negative, with no evidence of any interatrial shunt.  LEFT VENTRICLE PLAX 2D LVIDd:         3.10 cm     Diastology LVIDs:         2.20 cm     LV e' medial:    7.72 cm/s LV PW:         0.80 cm     LV E/e' medial:  17.9 LV IVS:        1.00 cm     LV e' lateral:   7.51 cm/s LVOT diam:     2.20 cm     LV E/e' lateral: 18.4 LV SV:         63 LV SV Index:   40 LVOT Area:     3.80 cm LV IVRT:       74 msec  LV Volumes (MOD) LV vol d, MOD A2C: 52.4 ml LV vol d, MOD A4C: 45.3 ml LV vol s, MOD A2C: 13.0 ml LV vol s, MOD A4C: 12.5 ml LV SV MOD A2C:     39.4 ml LV SV  MOD A4C:     45.3 ml LV SV MOD BP:      36.9 ml RIGHT VENTRICLE             IVC RV S prime:     10.00 cm/s  IVC diam: 1.60 cm TAPSE (M-mode): 1.9 cm                             PULMONARY VEINS                             Diastolic Velocity: 47.20 cm/s                             S/D Velocity:       0.90                             Systolic Velocity:  42.10 cm/s LEFT ATRIUM              Index        RIGHT ATRIUM           Index LA diam:         4.50 cm  2.81 cm/m   RA Area:     28.10 cm LA Vol (A2C):   108.0 ml 67.34 ml/m  RA Volume:   93.10 ml  58.05 ml/m LA Vol (A4C):   134.0 ml 83.55 ml/m LA Biplane Vol: 120.0 ml 74.82 ml/m  AORTIC VALVE                    PULMONIC VALVE AV Area (Vmax):    2.66 cm     PV Vmax:          0.90 m/s AV Area (Vmean):   2.63 cm     PV Peak grad:     3.3 mmHg AV Area (VTI):     2.91 cm     PR End Diast Vel: 9.86 msec AV Vmax:           118.00 cm/s AV Vmean:          80.400 cm/s AV VTI:            0.218 m AV Peak Grad:      5.6 mmHg AV Mean Grad:      3.0 mmHg LVOT Vmax:         82.50 cm/s LVOT Vmean:        55.600 cm/s LVOT VTI:          0.167 m LVOT/AV VTI ratio: 0.77  AORTA Ao Root diam: 3.70 cm MITRAL VALVE                TRICUSPID VALVE MV Area (PHT): 4.44 cm     TR Peak grad:   36.5 mmHg MV Area VTI:   2.56 cm     TR Vmax:        302.00 cm/s MV Peak grad:  7.4 mmHg MV Mean grad:  2.0 mmHg     SHUNTS MV Vmax:       1.36 m/s     Systemic VTI:  0.17 m MV Vmean:      59.3 cm/s    Systemic Diam: 2.20 cm MV Decel Time: 171 msec MV E velocity: 138.00 cm/s MV A velocity: 42.10  cm/s MV E/A ratio:  3.28 Kardie Tobb DO Electronically signed by Dub Huntsman DO Signature Date/Time: 09/10/2024/10:36:22 AM    Final     Cardiac Studies   N/A  Assessment   Principal Problem:   Left upper extremity numbness Active Problems:   Persistent atrial fibrillation (HCC)   Lung nodule   Lung cancer (HCC)   Acute cystitis   COPD (chronic obstructive pulmonary disease) (HCC)   CKD stage 3a, GFR 45-59 ml/min (HCC)   Atrial thrombus   Hydronephrosis of left kidney   Bladder wall thickening   Left atrial mass   NSVT (nonsustained ventricular tachycardia) (HCC)   Plan   PVC's have improved. Repeat BMET and magnesium  pending -goal K > 4.0 and Mg >2.0. Will transition to Eliquis  2.5 mg BID today. Plan repeat outpatient echo in 3 months with contrast to evaluate left atrial mass.  Time Spent Directly with Patient:  I  have spent a total of 25 minutes with the patient reviewing hospital notes, telemetry, EKGs, labs and examining the patient as well as establishing an assessment and plan that was discussed personally with the patient.  > 50% of time was spent in direct patient care.  Length of Stay:  LOS: 2 days   Vinie KYM Maxcy, MD, [facility], FNLA, FACP  [place]  [facility]  Medical Director of the [facility]  [facility]ovascular Risk [facility] Diplomate of the American Board of Clinical Lipidology Attending Cardiologist  Direct Dial: [phone]  Fax: [phone]  Website:  [email or web address]  Vinie BROCKS Krishan Mcbreen 09/12/2024, 7:11 AM   "

## 2024-09-12 NOTE — Plan of Care (Signed)
" °  Problem: Education: Goal: Knowledge of disease or condition will improve Outcome: Progressing   Problem: Coping: Goal: Will verbalize positive feelings about self Outcome: Progressing   Problem: Self-Care: Goal: Ability to participate in self-care as condition permits will improve Outcome: Progressing   Problem: Nutrition: Goal: Risk of aspiration will decrease Outcome: Progressing   Problem: Education: Goal: Knowledge of General Education information will improve Description: Including pain rating scale, medication(s)/side effects and non-pharmacologic comfort measures Outcome: Progressing   Problem: Clinical Measurements: Goal: Ability to maintain clinical measurements within normal limits will improve Outcome: Progressing Goal: Respiratory complications will improve Outcome: Progressing Goal: Cardiovascular complication will be avoided Outcome: Progressing   Problem: Activity: Goal: Risk for activity intolerance will decrease Outcome: Progressing   Problem: Nutrition: Goal: Adequate nutrition will be maintained Outcome: Progressing   Problem: Coping: Goal: Level of anxiety will decrease Outcome: Progressing   Problem: Elimination: Goal: Will not experience complications related to bowel motility Outcome: Progressing Goal: Will not experience complications related to urinary retention Outcome: Progressing   Problem: Pain Managment: Goal: General experience of comfort will improve and/or be controlled Outcome: Progressing   Problem: Safety: Goal: Ability to remain free from injury will improve Outcome: Progressing   "

## 2024-09-12 NOTE — Discharge Summary (Addendum)
 Physician Discharge Summary  [name] FMW:[id number] DOB: [birth year] DOA: 09/09/2024  PCP: Avva, Ravisankar, MD  Admit date: 09/09/2024 Discharge date: 09/12/2024 30 Day Unplanned Readmission Risk Score    Flowsheet Row ED to Hosp-Admission (Current) from 09/09/2024 in [facility]  30 Day Unplanned Readmission Risk Score (%) 34.19 Filed at 09/12/2024 0801    This score is the patient's risk of an unplanned readmission within 30 days of being discharged (0 -100%). The score is based on dignosis, age, lab data, medications, orders, and past utilization.   Low:  0-14.9   Medium: 15-21.9   High: 22-29.9   Extreme: 30 and above          Admitted From: Home Disposition: Home  Recommendations for Outpatient Follow-up:  Follow up with PCP in 1-2 weeks Please obtain BMP/CBC in one week Follow-up with your cardiologist in 2 to 4 weeks Please follow up with your PCP on the following pending results: Unresulted Labs (From admission, onward)     Start     Ordered   09/12/24 0500  CBC  Daily,   R      09/11/24 0705              Home Health: Yes Equipment/Devices: None  Discharge Condition: Stable CODE STATUS: Full code Diet recommendation:  Diet Order             Diet Heart Room service appropriate? Yes; Fluid consistency: Thin  Diet effective now                   Subjective: Seen and examined, nurse and patient's granddaughter at the bedside.  Patient complains of some pain in the left upper extremity and some weakness but that is improving.  She has no other complaint.  Plan of discharge discussed with her and the fact that she has been cleared by cardiology.  She agrees with the discharge however requesting some pain medications to go home with.  Brief/Interim Summary: [name] is a 79 y.o. female with medical history significant for atrial fibrillation no longer anticoagulated due to bleeding, anxiety, hypertension, hyperlipidemia,  hypothyroidism, and COPD, presented with pain, numbness, and weakness involving the left arm.    Patient reports that she is currently being treated for UTI and has had ongoing urinary symptoms but was otherwise in her usual state of health on the morning of 09/09/2024 until approximately 1030 or 11 AM when she developed pain in the distal left upper extremity.  She went on to develop numbness and also weakness in the left arm with symptoms now involving the entire arm.  She denied any headache or any other areas of numbness or weakness.  She was admitted under hospital service, details below.   Left upper extremity pain/numbness/tingling/weakness - Teleneurology recommended MRI brain which was negative for stroke - MRI cervical spine showed widespread chronic cervical degeneration - Likely that her pain is a vascular event in setting of left atrial thrombus.  Her weakness and pain is improving.  Per her request, I am discharging her on few tablets of oxycodone  as needed.   Left atrial thrombus - Cardiology consulted - Echocardiogram showed echodense mass seen in left atrium, not present on prior study in July 2025.  Cardiology believes this is clot and not myxoma as recent study did not show any myxoma at all and myxoma's typically do not develop infection..  Of time.  Started on IV heparin .  Patient had few episodes  of NSVT but otherwise no arrhythmia.  Patient has now been transition to Eliquis  2.5 mg p.o. twice daily by cardiology and she has been cleared for discharge.  She will follow-up with cardiology as outpatient.  All the risks and benefits of anticoagulation has been discussed with the patient and she is in full agreement with this plan.   A-fib - Not on anticoagulation as outpatient due to recurrent bleeding - Now on Eliquis . - Bisoprolol    Hyperlipidemia - Lipitor   Hypothyroidism - Continue Synthroid .     CKD 3A ruled out, patient has CKD stage IIIb. - Baseline creatinine  around 1.5.  Currently stable   Acute cystitis, present on admission - She was started on Cipro  as outpatient.  Treated with IV Rocephin  while in the hospital.  Has completed course for UTI.  Not receiving ciprofloxacin  at discharge.   Left hydroureteronephrosis - CT:  Mild left hydronephrosis and hydroureter. No obstructing stone. Underlying urothelial lesion at the level of the left ureterovesical junction as the cause of obstruction is not excluded. Further evaluation with cystoscopy is recommended. Thickened and trabeculated appearance of the bladder wall may be related to chronic bladder dysfunction. An infiltrative process is not excluded. Cystoscopy may provide better evaluation. - Patient followed by Dr. Selma, [facility]   Lung nodule, history of non-small cell lung cancer - Followed by Dr. Gatha - Possible LUL nodule noted on CT in the ED.  She will need close outpatient follow-up  Addendum: After the discharge was completed, I received a message from [facility] pharmacy that Eliquis  is $258.70 on insurance because of a deductible, they informed us  that Xarelto  would be cheaper for her.  Dr. Mona had gone for the day so we discussed case with another cardiologist Dr. Floretta who was covering for him, he recommended discharging patient on Xarelto  15 mg p.o. daily.  Since it took time to get response from cardiology, patient who was waiting at the pharmacy did not want to wait any longer and I received a message that she wanted me to send Xarelto  as well as oxycodone  to [facility] pharmacy.  I have sent those.  Discharge plan was discussed with patient and/or family member and they verbalized understanding and agreed with it.  Discharge Diagnoses:  Principal Problem:   Left upper extremity numbness Active Problems:   Acute cystitis   Persistent atrial fibrillation (HCC)   Lung nodule   Lung cancer (HCC)   COPD (chronic obstructive pulmonary disease) (HCC)   CKD stage 3a, GFR  45-59 ml/min (HCC)   Atrial thrombus   Hydronephrosis of left kidney   Bladder wall thickening   Left atrial mass   NSVT (nonsustained ventricular tachycardia) (HCC)    Discharge Instructions   Allergies as of 09/12/2024       Reactions   Eliquis  [apixaban ] Other (See Comments)   Caused blood in the urine   Penicillins Rash        Medication List     STOP taking these medications    chlorpheniramine-HYDROcodone  10-8 MG/5ML Commonly known as: TUSSIONEX   ciprofloxacin  250 MG tablet Commonly known as: CIPRO    Eliquis  2.5 MG Tabs tablet Generic drug: apixaban    ferrous sulfate  325 (65 FE) MG tablet   guaiFENesin -dextromethorphan  100-10 MG/5ML syrup Commonly known as: ROBITUSSIN DM   saccharomyces boulardii 250 MG capsule Commonly known as: Florastor       TAKE these medications    albuterol  108 (90 Base) MCG/ACT inhaler Commonly known as: VENTOLIN   HFA Inhale 2 puffs into the lungs every 6 (six) hours as needed for wheezing or shortness of breath.   Align Chew Chew 1 tablet by mouth 2 (two) times daily.   ascorbic acid 500 MG tablet Commonly known as: VITAMIN C Take 500 mg by mouth daily.   atorvastatin  10 MG tablet Commonly known as: LIPITOR Take 1 tablet (10 mg total) by mouth daily. What changed: when to take this   bisoprolol  5 MG tablet Commonly known as: ZEBETA  Take 2.5 mg by mouth daily.   buPROPion  75 MG tablet Commonly known as: WELLBUTRIN  Take 75 mg by mouth every morning.   colestipol  1 g tablet Commonly known as: COLESTID  Take 1 tablet (1 g total) by mouth 2 (two) times daily.   FLUoxetine  20 MG capsule Commonly known as: PROZAC  Take 20 mg by mouth at bedtime. What changed: Another medication with the same name was removed. Continue taking this medication, and follow the directions you see here.   gabapentin  600 MG tablet Commonly known as: NEURONTIN  Take 600 mg by mouth in the morning and at bedtime.   hyoscyamine  0.125 MG  tablet Commonly known as: LEVSIN  Take 1 tablet (0.125 mg total) by mouth every 6 (six) hours as needed for cramping.   levothyroxine  88 MCG tablet Commonly known as: Synthroid  Take 1 tablet (88 mcg total) by mouth daily before breakfast.   loratadine  10 MG tablet Commonly known as: CLARITIN  Take 10 mg by mouth daily as needed for allergies or rhinitis.   METAMUCIL PO Take 1 capsule by mouth at bedtime. What changed: Another medication with the same name was removed. Continue taking this medication, and follow the directions you see here.   omeprazole  20 MG capsule Commonly known as: PRILOSEC  Take 1 capsule (20 mg total) by mouth 2 (two) times daily before a meal.   ondansetron  4 MG disintegrating tablet Commonly known as: ZOFRAN -ODT Take 1 tablet (4 mg total) by mouth every 8 (eight) hours as needed for nausea or vomiting (DISSOLVE ORALLY).   oxyCODONE  5 MG immediate release tablet Commonly known as: Oxy IR/ROXICODONE  Take 1 tablet (5 mg total) by mouth every 6 (six) hours as needed.   Rivaroxaban  15 MG Tabs tablet Commonly known as: XARELTO  Take 1 tablet (15 mg total) by mouth daily with supper.   TYLENOL  500 MG tablet Generic drug: acetaminophen  Take 1,000 mg by mouth in the morning and at bedtime. What changed: Another medication with the same name was removed. Continue taking this medication, and follow the directions you see here.   Vitamin D3 1000 units Caps Take 1,000 Units by mouth daily.        Contact information for follow-up providers     Avva, Ravisankar, MD Follow up in 1 week(s).   Specialty: Internal Medicine Contact information: [address] [place] [address] [phone]         Janene Boer, [place] Follow up.   Specialties: Cardiology, Radiology Why: [facility] - cardiology follow-up with PA Boer Janene on Thursday Sep 25, 2024 8:25 AM (Arrive by 8:05 AM). Contact information: [address] [place] [address] [phone]               Contact information for after-discharge care     Home Medical Care     [facility] - Closed - [facility]) .   Service: Home Health Services Contact information: [address] [place]  [address] [phone]  Allergies[1]  Consultations: Cardiology   Procedures/Studies: MR CERVICAL SPINE WO CONTRAST Result Date: 09/10/2024 EXAM: MRI CERVICAL SPINE WITHOUT CONTRAST 09/10/2024 12:18:00 PM TECHNIQUE: Multiplanar multisequence MRI of the cervical spine was performed. COMPARISON: Cervical spine CT 12/01/2016. CTA head and neck yesterday. CLINICAL HISTORY: 79 year old female with left upper extremity pain, numbness, and weakness. Acute cervical spine myelopathy. FINDINGS: BONES AND ALIGNMENT: Mild cervicothoracic scoliosis. Straightening of cervical lordosis with multilevel mild degenerative appearing anterolisthesis, most pronounced at C4-C5. Normal vertebral body heights. Background marrow signal is normal. No marrow edema identified. Widespread chronic cervical facet arthropathy. SPINAL CORD: Normal spinal cord size. No abnormal spinal cord signal. SOFT TISSUES: No paraspinal mass. Preserved major vascular flow voids in the bilateral neck with tortuous vertebral arteries. Negative visible neck soft tissues and thoracic inlet. Negative cervicomedullary junction and visible cerebellum. Patchy T2 hyperintensity in the brainstem, most commonly due to chronic small vessel disease. DEGENERATIVE: C2-C3: Moderate facet hypertrophy greater on the right, moderate ligamentum flavum hypertrophy. Mild disc bulging and endplate spurring. Mild spinal stenosis. No cord mass effect. Moderate left C3 neural foraminal stenosis. C3-C4: Moderate to severe facet and ligamentum flavum hypertrophy greater on the left. Mild disc bulging and endplate spurring. Mild spinal stenosis. No spinal cord mass effect. Tortuosity of the left vertebral artery in the  left C4 neural foramen, moderate superimposed left foraminal stenosis. C4-C5: Mild anterolisthesis. Circumferential disc bulging, pseudodisc. Mild endplate spurring. Severe facet and ligamentum flavum hypertrophy bilaterally. Mild spinal stenosis. No cord mass effect (series 10 image 25). Tortuous vertebral arteries in the C5 neural foramina here. Moderate bilateral foraminal stenosis suspected. C5-C6: Circumferential disc bulging and endplate spurring. Moderate facet and mild ligamentum flavum hypertrophy. No spinal stenosis. Mild C6 neural foraminal stenosis. C6-C7: Disc space loss. Circumferential disc osteophyte complex asymmetric to the right. Mild posterior element hypertrophy. No spinal stenosis. Mild to moderate right C7 foraminal stenosis. C7-T1: Disc space loss. Circumferential disc osteophyte complex. Mild to moderate facet and ligamentum flavum hypertrophy. No spinal stenosis. Mild right C8 foraminal stenosis. No visible upper thoracic spinal stenosis. IMPRESSION: 1. Widespread chronic cervical degeneration. Mild multifactorial spinal stenosis from C2-C3 through C4-C5. No spinal cord mass effect or signal abnormality. 2. Bulky cervical facet degeneration, maximal at C4-C5 where this is mild anterolisthesis. Up to moderate associated degenerative cervical foraminal stenosis, with superimposed vertebral artery tortuosity in several of the cervical neural foramina. Electronically signed by: Helayne Hurst MD 09/10/2024 12:38 PM EST RP Workstation: [id number]   ECHOCARDIOGRAM COMPLETE BUBBLE STUDY Result Date: 09/10/2024    ECHOCARDIOGRAM REPORT   Patient Name:   [name] Date of Exam: 09/10/2024 Medical Rec #:  [id number]       Height:       66.0 in Accession #:    [id number]      Weight:       119.0 lb Date of Birth:  01-Mar-1946       BSA:          1.604 m Patient Age:    78 years        BP:           134/91 mmHg Patient Gender: F               HR:           73 bpm. Exam Location:  Inpatient  Procedure: 2D Echo, Cardiac Doppler, Color Doppler, Saline Contrast Bubble Study            and Intracardiac Opacification Agent (Both  Spectral and Color Flow            Doppler were utilized during procedure). Indications:    Atrial thrombus  History:        Patient has prior history of Echocardiogram examinations, most                 recent 03/18/2024.  Sonographer:    Odella Brewster Referring Phys: [id number] TIMOTHY S OPYD  Sonographer Comments: Image acquisition challenging due to respiratory motion. IMPRESSIONS  1. Echodense mass ( 1.35 cm x 2.29 cm) seen in the left atrium suspected to be a left atrial thrombus, this is not present on prior study on 03/18/2024. May benefiit from a cardiac MR. Left atrial size was severely dilated.  2. Left ventricular ejection fraction, by estimation, is 60 to 65%. The left ventricle has normal function. The left ventricle has no regional wall motion abnormalities. There is mild asymmetric left ventricular hypertrophy of the septal segment. Left ventricular diastolic parameters were normal.  3. Right ventricular systolic function is normal. The right ventricular size is normal.  4. Right atrial size was severely dilated.  5. The mitral valve is normal in structure. Mild to moderate mitral valve regurgitation. No evidence of mitral stenosis.  6. Tricuspid valve regurgitation is mild to moderate.  7. The aortic valve is calcified. There is mild calcification of the aortic valve. There is mild thickening of the aortic valve. Aortic valve regurgitation is not visualized. Aortic valve sclerosis/calcification is present, without any evidence of aortic stenosis.  8. The inferior vena cava is normal in size with greater than 50% respiratory variability, suggesting right atrial pressure of 3 mmHg.  9. Agitated saline contrast bubble study was negative, with no evidence of any interatrial shunt. FINDINGS  Left Ventricle: Left ventricular ejection fraction, by estimation, is 60 to 65%. The  left ventricle has normal function. The left ventricle has no regional wall motion abnormalities. Definity  contrast agent was given IV to delineate the left ventricular  endocardial borders. The left ventricular internal cavity size was normal in size. There is mild asymmetric left ventricular hypertrophy of the septal segment. Left ventricular diastolic parameters were normal. Right Ventricle: The right ventricular size is normal. No increase in right ventricular wall thickness. Right ventricular systolic function is normal. Left Atrium: Echodense mass ( 1.35 cm x 2.29 cm) seen in the left atrium suspected to be a left atrial thrombus, this is not present on prior study on 03/18/2024. May benefiit from a cardiac MR. Left atrial size was severely dilated. Right Atrium: Right atrial size was severely dilated. Pericardium: There is no evidence of pericardial effusion. Mitral Valve: The mitral valve is normal in structure. Mild to moderate mitral valve regurgitation. No evidence of mitral valve stenosis. MV peak gradient, 7.4 mmHg. The mean mitral valve gradient is 2.0 mmHg. Tricuspid Valve: The tricuspid valve is normal in structure. Tricuspid valve regurgitation is mild to moderate. No evidence of tricuspid stenosis. Aortic Valve: The aortic valve is calcified. There is mild calcification of the aortic valve. There is mild thickening of the aortic valve. There is moderate aortic valve annular calcification. Aortic valve regurgitation is not visualized. Aortic valve sclerosis/calcification is present, without any evidence of aortic stenosis. Aortic valve mean gradient measures 3.0 mmHg. Aortic valve peak gradient measures 5.6 mmHg. Aortic valve area, by VTI measures 2.91 cm. Pulmonic Valve: The pulmonic valve was normal in structure. Pulmonic valve regurgitation is not visualized. No evidence of pulmonic stenosis. Aorta: The aortic  root is normal in size and structure. Venous: The inferior vena cava is normal in size  with greater than 50% respiratory variability, suggesting right atrial pressure of 3 mmHg. IAS/Shunts: No atrial level shunt detected by color flow Doppler. Agitated saline contrast was given intravenously to evaluate for intracardiac shunting. Agitated saline contrast bubble study was negative, with no evidence of any interatrial shunt.  LEFT VENTRICLE PLAX 2D LVIDd:         3.10 cm     Diastology LVIDs:         2.20 cm     LV e' medial:    7.72 cm/s LV PW:         0.80 cm     LV E/e' medial:  17.9 LV IVS:        1.00 cm     LV e' lateral:   7.51 cm/s LVOT diam:     2.20 cm     LV E/e' lateral: 18.4 LV SV:         63 LV SV Index:   40 LVOT Area:     3.80 cm LV IVRT:       74 msec  LV Volumes (MOD) LV vol d, MOD A2C: 52.4 ml LV vol d, MOD A4C: 45.3 ml LV vol s, MOD A2C: 13.0 ml LV vol s, MOD A4C: 12.5 ml LV SV MOD A2C:     39.4 ml LV SV MOD A4C:     45.3 ml LV SV MOD BP:      36.9 ml RIGHT VENTRICLE             IVC RV S prime:     10.00 cm/s  IVC diam: 1.60 cm TAPSE (M-mode): 1.9 cm                             PULMONARY VEINS                             Diastolic Velocity: 47.20 cm/s                             S/D Velocity:       0.90                             Systolic Velocity:  42.10 cm/s LEFT ATRIUM              Index        RIGHT ATRIUM           Index LA diam:        4.50 cm  2.81 cm/m   RA Area:     28.10 cm LA Vol (A2C):   108.0 ml 67.34 ml/m  RA Volume:   93.10 ml  58.05 ml/m LA Vol (A4C):   134.0 ml 83.55 ml/m LA Biplane Vol: 120.0 ml 74.82 ml/m  AORTIC VALVE                    PULMONIC VALVE AV Area (Vmax):    2.66 cm     PV Vmax:          0.90 m/s AV Area (Vmean):   2.63 cm     PV Peak grad:     3.3 mmHg AV Area (VTI):  2.91 cm     PR End Diast Vel: 9.86 msec AV Vmax:           118.00 cm/s AV Vmean:          80.400 cm/s AV VTI:            0.218 m AV Peak Grad:      5.6 mmHg AV Mean Grad:      3.0 mmHg LVOT Vmax:         82.50 cm/s LVOT Vmean:        55.600 cm/s LVOT VTI:          0.167 m  LVOT/AV VTI ratio: 0.77  AORTA Ao Root diam: 3.70 cm MITRAL VALVE                TRICUSPID VALVE MV Area (PHT): 4.44 cm     TR Peak grad:   36.5 mmHg MV Area VTI:   2.56 cm     TR Vmax:        302.00 cm/s MV Peak grad:  7.4 mmHg MV Mean grad:  2.0 mmHg     SHUNTS MV Vmax:       1.36 m/s     Systemic VTI:  0.17 m MV Vmean:      59.3 cm/s    Systemic Diam: 2.20 cm MV Decel Time: 171 msec MV E velocity: 138.00 cm/s MV A velocity: 42.10 cm/s MV E/A ratio:  3.28 Kardie Tobb DO Electronically signed by Dub Huntsman DO Signature Date/Time: 09/10/2024/10:36:22 AM    Final    MR BRAIN WO CONTRAST Result Date: 09/09/2024 EXAM: MRI BRAIN WITHOUT CONTRAST 09/09/2024 06:49:00 PM TECHNIQUE: Multiplanar multisequence MRI of the head/brain was performed without the administration of intravenous contrast. COMPARISON: Head CT and CTA 09/09/2024 and MRI 12/13/2022. CLINICAL HISTORY: Neuro deficit, acute, stroke suspected. Left arm pain and numbness. FINDINGS: BRAIN AND VENTRICLES: There is no evidence of an acute infarct, intracranial hemorrhage, mass, midline shift, hydrocephalus, or extra-axial fluid collection. There is mild cerebral atrophy. T2 hyperintensities in the cerebral white matter and pons are similar to the prior MRI and nonspecific but compatible with mild chronic small vessel ischemic disease. Major intracranial vascular flow voids are preserved. Small right ICA aneurysm, evaluated in greater detail on today's earlier CTA. ORBITS: Bilateral cataract extraction. SINUSES AND MASTOIDS: No significant abnormality. BONES AND SOFT TISSUES: Small left frontal scalp lipoma. IMPRESSION: 1. No acute intracranial abnormality. 2. Mild chronic small vessel ischemic disease. Electronically signed by: Dasie Hamburg MD 09/09/2024 07:18 PM EST RP Workstation: [id number]   CT ANGIO CODE STROKE CHEST/ABD/PEL FOR DISSECTION W &/OR WO CONTRAST Result Date: 09/09/2024 CLINICAL DATA:  Neurologic deficit. Concern for aortic dissection.  Weakness. EXAM: CT ANGIOGRAPHY CHEST, ABDOMEN AND PELVIS TECHNIQUE: Non-contrast CT of the chest was initially obtained. Multidetector CT imaging through the chest, abdomen and pelvis was performed using the standard protocol during bolus administration of intravenous contrast. Multiplanar reconstructed images and MIPs were obtained and reviewed to evaluate the vascular anatomy. RADIATION DOSE REDUCTION: This exam was performed according to the departmental dose-optimization program which includes automated exposure control, adjustment of the mA and/or kV according to patient size and/or use of iterative reconstruction technique. CONTRAST:  OMNIPAQUE  IOHEXOL  350 MG/ML SOLN COMPARISON:  Chest CT dated 05/28/2024. FINDINGS: CTA CHEST FINDINGS Cardiovascular: There is mild cardiomegaly with biatrial dilatation. A 14 x 7 mm lesion in the left atrium is not characterized on this CT but may represent a blood clot, or  a primary cardiac tumor such as myxoma. Further evaluation with echocardiogram or MRI without and with contrast recommended. No pericardial effusion. There is coronary vascular calcification. There is moderate atherosclerotic calcification of the thoracic aorta. No aneurysmal dilatation or dissection. The origins of the great vessels of the aortic arch are patent. No pulmonary artery embolus identified. Mediastinum/Nodes: No hilar or mediastinal adenopathy. There is a moderate size hiatal hernia containing a portion of the spine. The esophagus is grossly unremarkable. No mediastinal fluid collection. Lungs/Pleura: Stable area of consolidation in the right middle lobe, likely scarring. Similar appearance of a cluster of nodularity in the left upper lobe with the largest nodule measuring up to 6 mm. Several additional faint ground-glass nodules in the left lower lobe measure up to 6 mm and similar to prior CT. Additional ground-glass nodule in the right upper lobe measure up to 5 mm and also similar to  prior CT. There is trace right pleural effusion. No new consolidation. No pneumothorax. The central airways are patent. Musculoskeletal: Osteopenia with degenerative changes of the spine. No acute osseous pathology. Review of the MIP images confirms the above findings. CTA ABDOMEN AND PELVIS FINDINGS VASCULAR Aorta: Advanced atherosclerotic calcification of the abdominal aorta. No aneurysmal dilatation or dissection. Celiac: The celiac trunk and its major branches are patent. SMA: The SMA is patent. Renals: Atherosclerotic calcification of the left renal artery. 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 The left renal artery remains patent. IMA: The IMA is patent. Inflow: Moderate atherosclerotic calcification of the iliac arteries. No aneurysmal dilatation or dissection. The iliac arteries are patent. Veins: No obvious venous abnormality within the limitations of this arterial phase study. Review of the MIP images confirms the above findings. NON-VASCULAR No intra-abdominal free air or free fluid. Hepatobiliary: Subcentimeter hepatic hypodense lesions are too small to characterize. Cholecystectomy. Dilatation of the common bile duct measuring 16 mm in diameter. No retained calcified stone noted in the central CBD. Pancreas: Unremarkable. No pancreatic ductal dilatation or surrounding inflammatory changes. Spleen: Normal in size without focal abnormality. Adrenals/Urinary Tract: The adrenal glands unremarkable. Solitary left kidney. There is mild left hydronephrosis. Extrarenal left pelvis with mild pelviectasis. There is mild left hydroureter. No obstructing stone. Underlying urothelial lesion at the level of the left ureterovesical junction as the cause of obstruction is not excluded. The urinary bladder is minimally distended. There is thickened and trabeculated appearance of the bladder wall which may be related to chronic bladder dysfunction. An infiltrative process is not excluded. Cystoscopy  may provide better evaluation. There is perivesical stranding. Correlation with urinalysis recommended to exclude cystitis. Stomach/Bowel: There is no bowel obstruction or active inflammation. The appendix is not visualized with certainty. No inflammatory changes identified in the right lower quadrant. Lymphatic: No adenopathy. Reproductive: Hysterectomy. Other: None Musculoskeletal: Osteopenia with degenerative changes of the spine. Lower lumbar posterior fusion. Total right hip arthroplasty. No acute osseous pathology. Review of the MIP images confirms the above findings. IMPRESSION: 1. No aortic dissection or aneurysm. 2. A 14 x 7 mm lesion in the left atrium may represent a blood clot, or a primary cardiac tumor such as myxoma. Further evaluation with echocardiogram or MRI without and with contrast recommended. 3. Mild left hydronephrosis and hydroureter. No obstructing stone. Underlying urothelial lesion at the level of the left ureterovesical junction as the cause of obstruction is not excluded. Further evaluation with cystoscopy is recommended. 4. Thickened and trabeculated appearance of the bladder wall may be related  to chronic bladder dysfunction. An infiltrative process is not excluded. Cystoscopy may provide better evaluation. 5. Perivesical stranding. Correlation with urinalysis recommended to exclude cystitis. 6. 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 7. No bowel obstruction. 8.  Aortic Atherosclerosis (ICD10-I70.0). Electronically Signed   By: Vanetta Chou M.D.   On: 09/09/2024 16:14   CT ANGIO HEAD NECK W WO CM Result Date: 09/09/2024 EXAM: CTA HEAD AND NECK WITH AND WITHOUT 09/09/2024 03:43:00 PM TECHNIQUE: CTA of the head and neck was performed with and without the administration of 125 mL of intravenous contrast (iohexol  (OMNIPAQUE ) 350 MG/ML injection 150 mL IOHEXOL  350 MG/ML SOLN). Multiplanar 2D and/or 3D reformatted images are provided for review. Automated exposure control,  iterative reconstruction, and/or weight based adjustment of the mA/kV was utilized to reduce the radiation dose to as low as reasonably achievable. Stenosis of the internal carotid arteries measured using NASCET criteria. COMPARISON: CT head dated 09/09/2024 03:43:00 PM CLINICAL HISTORY: Stroke/TIA, determine embolic source. FINDINGS: CTA NECK: AORTIC ARCH AND ARCH VESSELS: Atherosclerosis of the visualized aortic arch and aortic arch vessel origins. Mild stenosis at the origin of the left subclavian artery. No dissection or arterial injury. CERVICAL CAROTID ARTERIES: Mild atherosclerosis of the right carotid bifurcation without hemodynamically significant stenosis of the mid right cervical ICA. Mild atherosclerosis along the mid left common carotid artery and at the left carotid bifurcation without hemodynamically significant stenosis. Prominent tortuosity of the mid and distal left cervical ICA. Multiple areas of irregularity with subtle narrowing and outpouching of the left cervical ICA which may reflect fibromuscular dysplasia. No dissection or arterial injury. CERVICAL VERTEBRAL ARTERIES: Tortuosity of the V1 segment of the right vertebral artery. No dissection, arterial injury, or significant stenosis. LUNGS AND MEDIASTINUM: Incompletely visualized 1.0 cm nodule in the anterior left upper lobe seen on series 6 image 13. The remainder of the visualized lungs and mediastinum are unremarkable. SOFT TISSUES: Edentulous maxilla and mandible. Degenerative changes of the left temporomandibular joint. No acute abnormality. BONES: Degenerative changes throughout the visualized spine. Disc space narrowing is greatest at C6-C7. No acute abnormality. CTA HEAD: ANTERIOR CIRCULATION: Atherosclerosis of the carotid siphons without significant stenosis. There is a 5 x 4 x 5 mm laterally and slightly posteriorly directed outpouching along the anterior genia of the right cavernous ICA/right paraophthalmic ICA (series 9 image  105). There is an additional 1.5 mm superiorly directed outpouching along the right paraophthalmic ICA (series 9 image 101). 2.5 mm inferiorly directed outpouching along the left supraclinoid ICA which may reflect an infundibulum at the posterior communicating artery origin versus small aneurysm. No significant stenosis of the anterior cerebral arteries. No significant stenosis of the middle cerebral arteries. POSTERIOR CIRCULATION: Fetal origin of the right PCA. No significant stenosis of the posterior cerebral arteries. No significant stenosis of the basilar artery. No significant stenosis of the vertebral arteries. No aneurysm. OTHER: No dural venous sinus thrombosis on this non-dedicated study. IMPRESSION: 1. No acute large vessel occlusion. 2.  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 3. 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 4. 2.5 mm outpouching along the left supraclinoid ICA, possibly representing an infundibulum versus small aneurysm. 5. Prominent tortuosity of the mid and distal left cervical ICA with multiple areas of irregularity, subtle narrowing, and outpouching, possibly reflecting fibromuscular dysplasia. 6. Mild atherosclerosis as above. 7. Incompletely visualized 1.0 cm left upper lobe pulmonary nodule, with prompt non-contrast chest CT for full evaluation recommended per  Fleischner Society Guidelines. Electronically signed by: Donnice Mania MD 09/09/2024 04:13 PM EST RP Workstation: [id number]   CT HEAD CODE STROKE WO CONTRAST (LKW 0-4.5h, LVO 0-24h) Result Date: 09/09/2024 EXAM: CT HEAD WITHOUT CONTRAST 09/09/2024 03:30:49 PM TECHNIQUE: CT of the head was performed without the administration of intravenous contrast. Automated exposure control, iterative reconstruction, and/or weight based adjustment of the mA/kV was utilized to reduce the radiation dose to as low as reasonably achievable. COMPARISON: Head CT and MRI 12/13/2022.  CLINICAL HISTORY: Neuro deficit, acute, stroke suspected. Left arm pain and weakness. FINDINGS: BRAIN AND VENTRICLES: There is no evidence of an acute infarct, intracranial hemorrhage, mass, midline shift, hydrocephalus, or extra-axial fluid collection. There is mild cerebral atrophy. Cerebral white matter hypodensities are similar to the prior CT and nonspecific but compatible with mild chronic small vessel ischemic disease. Calcified atherosclerosis at the skull base. ORBITS: Bilateral cataract extraction. SINUSES: No acute abnormality. SOFT TISSUES AND SKULL: Small left frontal scalp lipoma. No skull fracture. [place] Stroke Program Early CT Score (ASPECTS) ----- Ganglionic (caudate, IC, lentiform nucleus, insula, M1-M3): 7 Supraganglionic (M4-M6): 3 Total: 10 IMPRESSION: 1. No acute intracranial abnormality. ASPECTS of 10. 2. Mild chronic small vessel ischemic disease. 3. These results were communicated to Dr. FREDRIK Hock at 3:34pm on 09/09/2024 by secure text page via the [facility] messaging system. Electronically signed by: Dasie Hamburg MD 09/09/2024 03:35 PM EST RP Workstation: [id number]     Discharge Exam: Vitals:   09/12/24 0342 09/12/24 0755  BP: 130/67 136/73  Pulse: 64 81  Resp: 18 16  Temp: (!) 97.5 F (36.4 C) 98.6 F (37 C)  SpO2: 98% 98%   Vitals:   09/11/24 2024 09/11/24 2317 09/12/24 0342 09/12/24 0755  BP: (!) 105/59 131/79 130/67 136/73  Pulse: 78 66 64 81  Resp: 18 18 18 16   Temp: 97.9 F (36.6 C) 97.7 F (36.5 C) (!) 97.5 F (36.4 C) 98.6 F (37 C)  TempSrc: Oral Oral Oral Oral  SpO2: 97% 97% 98% 98%  Weight:      Height:        General: Pt is alert, awake, not in acute distress Cardiovascular: RRR, S1/S2 +, no rubs, no gallops Respiratory: CTA bilaterally, no wheezing, no rhonchi Abdominal: Soft, NT, ND, bowel sounds + Extremities: no edema, no cyanosis, very trivial weakness in the left hand.    The results of significant diagnostics from this  hospitalization (including imaging, microbiology, ancillary and laboratory) are listed below for reference.     Microbiology: No results found for this or any previous visit (from the past 240 hours).   Labs: BNP (last 3 results) No results for input(s): BNP in the last 8760 hours. Basic Metabolic Panel: Recent Labs  Lab 09/09/24 1520 09/09/24 1526 09/09/24 1554 09/10/24 0336 09/11/24 0224 09/12/24 0703  NA 143 144 139 142 141 138  K 4.2 4.1 4.3 4.0 3.6 4.6  CL 110 109 104 109 108 109  CO2 21*  --   --  25 23 18*  GLUCOSE 116* 113* 97 80 118* 117*  BUN 21 22 24* 18 16 13   CREATININE 1.56* 1.70* 1.70* 1.47* 1.56* 1.32*  CALCIUM  9.7  --   --  9.3 8.9 9.8  MG  --   --   --   --  1.5* 2.5*   Liver Function Tests: Recent Labs  Lab 09/09/24 1520 09/10/24 0336  AST 14* 12*  ALT 6 5  ALKPHOS 81 71  BILITOT 0.3 0.3  PROT  6.8 6.3*  ALBUMIN 3.9 3.6   No results for input(s): LIPASE, AMYLASE in the last 168 hours. No results for input(s): AMMONIA in the last 168 hours. CBC: Recent Labs  Lab 09/09/24 1520 09/09/24 1526 09/09/24 1554 09/10/24 0336 09/11/24 0224 09/12/24 0703  WBC 7.7  --   --  6.5 5.0 5.1  NEUTROABS 6.1  --   --   --   --   --   HGB 11.9* 12.6 9.9* 10.6* 9.9* 11.1*  HCT 37.6 37.0 29.0* 33.5* 31.1* 34.8*  MCV 96.7  --   --  97.4 96.6 97.5  PLT 252  --   --  191 181 178   Cardiac Enzymes: No results for input(s): CKTOTAL, CKMB, CKMBINDEX, TROPONINI in the last 168 hours. BNP: Invalid input(s): POCBNP CBG: No results for input(s): GLUCAP in the last 168 hours. D-Dimer No results for input(s): DDIMER in the last 72 hours. Hgb A1c Recent Labs    09/10/24 0336  HGBA1C 5.5   Lipid Profile Recent Labs    09/10/24 0336  CHOL 138  HDL 56  LDLCALC 68  TRIG 70  CHOLHDL 2.5   Thyroid  function studies No results for input(s): TSH, T4TOTAL, T3FREE, THYROIDAB in the last 72 hours.  Invalid input(s): FREET3 Anemia  work up No results for input(s): VITAMINB12, FOLATE, FERRITIN, TIBC, IRON, RETICCTPCT in the last 72 hours. Urinalysis    Component Value Date/Time   COLORURINE YELLOW 03/27/2024 0659   APPEARANCEUR HAZY (A) 03/27/2024 0659   LABSPEC 1.008 03/27/2024 0659   PHURINE 6.0 03/27/2024 0659   GLUCOSEU NEGATIVE 03/27/2024 0659   HGBUR MODERATE (A) 03/27/2024 0659   BILIRUBINUR NEGATIVE 03/27/2024 0659   KETONESUR NEGATIVE 03/27/2024 0659   PROTEINUR 30 (A) 03/27/2024 0659   NITRITE NEGATIVE 03/27/2024 0659   LEUKOCYTESUR SMALL (A) 03/27/2024 0659   Sepsis Labs Recent Labs  Lab 09/09/24 1520 09/10/24 0336 09/11/24 0224 09/12/24 0703  WBC 7.7 6.5 5.0 5.1   Microbiology No results found for this or any previous visit (from the past 240 hours).  FURTHER DISCHARGE INSTRUCTIONS:   Get Medicines reviewed and adjusted: Please take all your medications with you for your next visit with your Primary MD   Laboratory/radiological data: Please request your Primary MD to go over all hospital tests and procedure/radiological results at the follow up, please ask your Primary MD to get all Hospital records sent to his/her office.   In some cases, they will be blood work, cultures and biopsy results pending at the time of your discharge. Please request that your primary care M.D. goes through all the records of your hospital data and follows up on these results.   Also Note the following: If you experience worsening of your admission symptoms, develop shortness of breath, life threatening emergency, suicidal or homicidal thoughts you must seek medical attention immediately by calling 911 or calling your MD immediately  if symptoms less severe.   You must read complete instructions/literature along with all the possible adverse reactions/side effects for all the Medicines you take and that have been prescribed to you. Take any new Medicines after you have completely understood and accpet  all the possible adverse reactions/side effects.    patient was instructed, not to drive, operate heavy machinery, perform activities at heights, swimming or participation in water  activities or provide baby-sitting services while on Pain, Sleep and Anxiety Medications; until their outpatient Physician has advised to do so again. Also recommended to not to take more than  prescribed Pain, Sleep and Anxiety Medications.  It is not advisable to combine anxiety, sleep and pain medications without talking with your primary care provider.     Wear Seat belts while driving.   Please note: You were cared for by a hospitalist during your hospital stay. Once you are discharged, your primary care physician will handle any further medical issues. Please note that NO REFILLS for any discharge medications will be authorized once you are discharged, as it is imperative that you return to your primary care physician (or establish a relationship with a primary care physician if you do not have one) for your post hospital discharge needs so that they can reassess your need for medications and monitor your lab values  Time coordinating discharge: Over 30 minutes  SIGNED:   Fredia Skeeter, MD  [facility] 09/12/2024, 11:51 AM *Please note that this is a verbal dictation therefore any spelling or grammatical errors are due to the Dragon [employer] One system interpretation. If 7PM-7AM, please contact night-coverage [email or web address]     [1]  Allergies Allergen Reactions   Eliquis  [Apixaban ] Other (See Comments)    Caused blood in the urine   Penicillins Rash

## 2024-09-12 NOTE — Discharge Instructions (Addendum)

## 2024-09-25 ENCOUNTER — Ambulatory Visit: Admitting: Physician Assistant

## 2024-10-22 ENCOUNTER — Ambulatory Visit: Admitting: Cardiology

## 2024-11-25 ENCOUNTER — Inpatient Hospital Stay

## 2024-12-03 ENCOUNTER — Inpatient Hospital Stay: Admitting: Internal Medicine
